# Patient Record
Sex: Female | Born: 1950
Health system: Southern US, Community
[De-identification: ages and names within clinical notes are randomized; demographics above are authoritative.]

## PROBLEM LIST (undated history)

## (undated) DIAGNOSIS — N816 Rectocele: Secondary | ICD-10-CM

## (undated) DIAGNOSIS — E669 Obesity, unspecified: Secondary | ICD-10-CM

## (undated) DIAGNOSIS — F32A Depression, unspecified: Secondary | ICD-10-CM

## (undated) DIAGNOSIS — M722 Plantar fascial fibromatosis: Secondary | ICD-10-CM

## (undated) DIAGNOSIS — R011 Cardiac murmur, unspecified: Secondary | ICD-10-CM

## (undated) DIAGNOSIS — J449 Chronic obstructive pulmonary disease, unspecified: Secondary | ICD-10-CM

## (undated) DIAGNOSIS — H9190 Unspecified hearing loss, unspecified ear: Secondary | ICD-10-CM

## (undated) DIAGNOSIS — K219 Gastro-esophageal reflux disease without esophagitis: Secondary | ICD-10-CM

## (undated) DIAGNOSIS — F419 Anxiety disorder, unspecified: Secondary | ICD-10-CM

## (undated) DIAGNOSIS — T7840XA Allergy, unspecified, initial encounter: Secondary | ICD-10-CM

## (undated) DIAGNOSIS — J101 Influenza due to other identified influenza virus with other respiratory manifestations: Secondary | ICD-10-CM

## (undated) DIAGNOSIS — M8580 Other specified disorders of bone density and structure, unspecified site: Secondary | ICD-10-CM

## (undated) DIAGNOSIS — I639 Cerebral infarction, unspecified: Secondary | ICD-10-CM

## (undated) DIAGNOSIS — O00109 Unspecified tubal pregnancy without intrauterine pregnancy: Secondary | ICD-10-CM

## (undated) DIAGNOSIS — E039 Hypothyroidism, unspecified: Secondary | ICD-10-CM

## (undated) DIAGNOSIS — D696 Thrombocytopenia, unspecified: Secondary | ICD-10-CM

## (undated) DIAGNOSIS — K589 Irritable bowel syndrome without diarrhea: Secondary | ICD-10-CM

## (undated) DIAGNOSIS — M51369 Other intervertebral disc degeneration, lumbar region without mention of lumbar back pain or lower extremity pain: Secondary | ICD-10-CM

## (undated) DIAGNOSIS — I10 Essential (primary) hypertension: Secondary | ICD-10-CM

## (undated) DIAGNOSIS — E785 Hyperlipidemia, unspecified: Secondary | ICD-10-CM

## (undated) DIAGNOSIS — F329 Major depressive disorder, single episode, unspecified: Secondary | ICD-10-CM

## (undated) DIAGNOSIS — J189 Pneumonia, unspecified organism: Secondary | ICD-10-CM

## (undated) DIAGNOSIS — C44300 Unspecified malignant neoplasm of skin of unspecified part of face: Secondary | ICD-10-CM

## (undated) DIAGNOSIS — C44601 Unspecified malignant neoplasm of skin of unspecified upper limb, including shoulder: Secondary | ICD-10-CM

## (undated) DIAGNOSIS — M5136 Other intervertebral disc degeneration, lumbar region: Secondary | ICD-10-CM

## (undated) HISTORY — PX: UPPER GI ENDOSCOPY: SHX6162

## (undated) HISTORY — DX: Other specified disorders of bone density and structure, unspecified site: M85.80

## (undated) HISTORY — DX: Unspecified malignant neoplasm of skin of unspecified part of face: C44.300

## (undated) HISTORY — DX: Plantar fascial fibromatosis: M72.2

## (undated) HISTORY — DX: Anxiety disorder, unspecified: F41.9

## (undated) HISTORY — DX: Chronic obstructive pulmonary disease, unspecified: J44.9

## (undated) HISTORY — DX: Allergy, unspecified, initial encounter: T78.40XA

## (undated) HISTORY — DX: Essential (primary) hypertension: I10

## (undated) HISTORY — DX: Obesity, unspecified: E66.9

## (undated) HISTORY — PX: FOOT SURGERY: SHX648

## (undated) HISTORY — DX: Unspecified hearing loss, unspecified ear: H91.90

## (undated) HISTORY — PX: COLONOSCOPY: SHX174

## (undated) HISTORY — DX: Major depressive disorder, single episode, unspecified: F32.9

## (undated) HISTORY — DX: Rectocele: N81.6

## (undated) HISTORY — DX: Gastro-esophageal reflux disease without esophagitis: K21.9

## (undated) HISTORY — DX: Thrombocytopenia, unspecified: D69.6

## (undated) HISTORY — DX: Cardiac murmur, unspecified: R01.1

## (undated) HISTORY — DX: Hypothyroidism, unspecified: E03.9

## (undated) HISTORY — DX: Other intervertebral disc degeneration, lumbar region: M51.36

## (undated) HISTORY — PX: HERNIA REPAIR: SHX51

## (undated) HISTORY — PX: APPENDECTOMY: SHX54

## (undated) HISTORY — DX: Depression, unspecified: F32.A

## (undated) HISTORY — DX: Other intervertebral disc degeneration, lumbar region without mention of lumbar back pain or lower extremity pain: M51.369

## (undated) HISTORY — PX: CYST EXCISION: SHX5701

## (undated) HISTORY — DX: Hyperlipidemia, unspecified: E78.5

## (undated) HISTORY — DX: Unspecified malignant neoplasm of skin of unspecified upper limb, including shoulder: C44.601

## (undated) HISTORY — DX: Cerebral infarction, unspecified: I63.9

---

## 1973-05-31 HISTORY — PX: TUBAL LIGATION: SHX77

## 1998-06-13 ENCOUNTER — Other Ambulatory Visit: Admission: RE | Admit: 1998-06-13 | Discharge: 1998-06-13 | Payer: Self-pay | Admitting: Otolaryngology

## 1998-11-18 ENCOUNTER — Other Ambulatory Visit: Admission: RE | Admit: 1998-11-18 | Discharge: 1998-11-18 | Payer: Self-pay | Admitting: Family Medicine

## 2000-02-09 LAB — FECAL OCCULT BLOOD, GUAIAC: Fecal Occult Blood: NEGATIVE

## 2000-02-15 ENCOUNTER — Other Ambulatory Visit: Admission: RE | Admit: 2000-02-15 | Discharge: 2000-02-15 | Payer: Self-pay | Admitting: Family Medicine

## 2000-05-25 ENCOUNTER — Encounter: Payer: Self-pay | Admitting: Orthopedic Surgery

## 2000-05-25 ENCOUNTER — Ambulatory Visit (HOSPITAL_COMMUNITY): Admission: RE | Admit: 2000-05-25 | Discharge: 2000-05-25 | Payer: Self-pay | Admitting: Orthopedic Surgery

## 2001-06-29 ENCOUNTER — Emergency Department (HOSPITAL_COMMUNITY): Admission: AC | Admit: 2001-06-29 | Discharge: 2001-06-29 | Payer: Self-pay

## 2001-12-11 ENCOUNTER — Encounter: Payer: Self-pay | Admitting: Family Medicine

## 2001-12-11 ENCOUNTER — Encounter: Admission: RE | Admit: 2001-12-11 | Discharge: 2001-12-11 | Payer: Self-pay | Admitting: Family Medicine

## 2001-12-14 ENCOUNTER — Encounter: Admission: RE | Admit: 2001-12-14 | Discharge: 2002-01-18 | Payer: Self-pay | Admitting: Family Medicine

## 2002-05-31 HISTORY — PX: CHOLECYSTECTOMY: SHX55

## 2002-11-29 HISTORY — PX: OTHER SURGICAL HISTORY: SHX169

## 2002-12-17 ENCOUNTER — Emergency Department (HOSPITAL_COMMUNITY): Admission: EM | Admit: 2002-12-17 | Discharge: 2002-12-17 | Payer: Self-pay | Admitting: Emergency Medicine

## 2002-12-17 ENCOUNTER — Encounter: Payer: Self-pay | Admitting: Emergency Medicine

## 2003-01-02 ENCOUNTER — Other Ambulatory Visit: Admission: RE | Admit: 2003-01-02 | Discharge: 2003-01-02 | Payer: Self-pay | Admitting: Family Medicine

## 2003-01-02 ENCOUNTER — Encounter (INDEPENDENT_AMBULATORY_CARE_PROVIDER_SITE_OTHER): Payer: Self-pay | Admitting: Internal Medicine

## 2003-01-02 LAB — CONVERTED CEMR LAB: Pap Smear: NORMAL

## 2003-09-06 ENCOUNTER — Inpatient Hospital Stay (HOSPITAL_COMMUNITY): Admission: EM | Admit: 2003-09-06 | Discharge: 2003-09-09 | Payer: Self-pay | Admitting: Podiatry

## 2003-09-08 ENCOUNTER — Encounter (INDEPENDENT_AMBULATORY_CARE_PROVIDER_SITE_OTHER): Payer: Self-pay | Admitting: *Deleted

## 2004-04-10 ENCOUNTER — Ambulatory Visit: Payer: Self-pay | Admitting: Family Medicine

## 2004-04-17 ENCOUNTER — Ambulatory Visit: Payer: Self-pay | Admitting: Internal Medicine

## 2004-05-12 ENCOUNTER — Ambulatory Visit: Payer: Self-pay | Admitting: Family Medicine

## 2004-05-27 ENCOUNTER — Ambulatory Visit: Payer: Self-pay | Admitting: Internal Medicine

## 2004-09-19 ENCOUNTER — Emergency Department (HOSPITAL_COMMUNITY): Admission: EM | Admit: 2004-09-19 | Discharge: 2004-09-19 | Payer: Self-pay | Admitting: Emergency Medicine

## 2004-10-23 ENCOUNTER — Ambulatory Visit: Payer: Self-pay | Admitting: Internal Medicine

## 2004-12-08 ENCOUNTER — Ambulatory Visit: Payer: Self-pay | Admitting: Family Medicine

## 2005-03-31 ENCOUNTER — Ambulatory Visit: Payer: Self-pay | Admitting: Family Medicine

## 2005-04-16 ENCOUNTER — Ambulatory Visit: Payer: Self-pay | Admitting: Family Medicine

## 2005-05-14 ENCOUNTER — Ambulatory Visit: Payer: Self-pay | Admitting: Family Medicine

## 2005-06-14 ENCOUNTER — Ambulatory Visit: Payer: Self-pay | Admitting: Family Medicine

## 2005-08-25 ENCOUNTER — Ambulatory Visit: Payer: Self-pay | Admitting: Family Medicine

## 2005-09-01 ENCOUNTER — Ambulatory Visit: Payer: Self-pay | Admitting: Family Medicine

## 2005-09-17 ENCOUNTER — Ambulatory Visit: Payer: Self-pay | Admitting: Family Medicine

## 2005-09-29 ENCOUNTER — Ambulatory Visit: Payer: Self-pay | Admitting: Family Medicine

## 2006-04-28 ENCOUNTER — Ambulatory Visit: Payer: Self-pay | Admitting: Family Medicine

## 2006-05-06 ENCOUNTER — Ambulatory Visit: Payer: Self-pay | Admitting: Family Medicine

## 2006-05-10 ENCOUNTER — Ambulatory Visit: Payer: Self-pay | Admitting: Family Medicine

## 2006-08-09 ENCOUNTER — Ambulatory Visit: Payer: Self-pay | Admitting: Family Medicine

## 2006-08-09 LAB — CONVERTED CEMR LAB
Cholesterol: 201 mg/dL (ref 0–200)
Direct LDL: 148.4 mg/dL
HDL: 34.7 mg/dL — ABNORMAL LOW (ref >39.0)
Total CHOL/HDL Ratio: 5.8
Triglycerides: 85 mg/dL (ref 0–149)
VLDL: 17 mg/dL (ref 0–40)

## 2007-02-27 ENCOUNTER — Encounter (INDEPENDENT_AMBULATORY_CARE_PROVIDER_SITE_OTHER): Payer: Self-pay | Admitting: Internal Medicine

## 2007-02-27 DIAGNOSIS — K219 Gastro-esophageal reflux disease without esophagitis: Secondary | ICD-10-CM | POA: Insufficient documentation

## 2007-02-27 DIAGNOSIS — F17200 Nicotine dependence, unspecified, uncomplicated: Secondary | ICD-10-CM | POA: Insufficient documentation

## 2007-02-27 DIAGNOSIS — F321 Major depressive disorder, single episode, moderate: Secondary | ICD-10-CM | POA: Insufficient documentation

## 2007-02-27 DIAGNOSIS — E7800 Pure hypercholesterolemia, unspecified: Secondary | ICD-10-CM | POA: Insufficient documentation

## 2007-02-27 DIAGNOSIS — F411 Generalized anxiety disorder: Secondary | ICD-10-CM | POA: Insufficient documentation

## 2007-02-27 DIAGNOSIS — E039 Hypothyroidism, unspecified: Secondary | ICD-10-CM | POA: Insufficient documentation

## 2007-03-06 ENCOUNTER — Encounter (INDEPENDENT_AMBULATORY_CARE_PROVIDER_SITE_OTHER): Payer: Self-pay | Admitting: Internal Medicine

## 2007-03-06 ENCOUNTER — Ambulatory Visit: Payer: Self-pay | Admitting: Family Medicine

## 2007-03-14 ENCOUNTER — Encounter (INDEPENDENT_AMBULATORY_CARE_PROVIDER_SITE_OTHER): Payer: Self-pay | Admitting: Internal Medicine

## 2007-03-16 ENCOUNTER — Encounter (INDEPENDENT_AMBULATORY_CARE_PROVIDER_SITE_OTHER): Payer: Self-pay | Admitting: Internal Medicine

## 2007-03-16 LAB — CONVERTED CEMR LAB
ALT: 26 units/L (ref 0–35)
AST: 22 units/L (ref 0–37)
Albumin: 3.9 g/dL (ref 3.5–5.2)
Alkaline Phosphatase: 105 units/L (ref 39–117)
BUN: 6 mg/dL (ref 6–23)
Basophils Absolute: 0.1 10*3/uL (ref 0.0–0.1)
Basophils Relative: 0.9 % (ref 0.0–1.0)
Bilirubin, Direct: 0.1 mg/dL (ref 0.0–0.3)
CO2: 28 meq/L (ref 19–32)
Calcium: 9.5 mg/dL (ref 8.4–10.5)
Chloride: 103 meq/L (ref 96–112)
Cholesterol: 168 mg/dL (ref 0–200)
Creatinine, Ser: 0.7 mg/dL (ref 0.4–1.2)
Eosinophils Absolute: 0.2 10*3/uL (ref 0.0–0.6)
Eosinophils Relative: 2.1 % (ref 0.0–5.0)
GFR calc Af Amer: 111 mL/min
GFR calc non Af Amer: 91 mL/min
Glucose, Bld: 84 mg/dL (ref 70–99)
HCT: 41.6 % (ref 36.0–46.0)
HDL: 33.2 mg/dL — ABNORMAL LOW (ref >39.0)
Hemoglobin: 14.8 g/dL (ref 12.0–15.0)
LDL Cholesterol: 114 mg/dL — ABNORMAL HIGH (ref 0–99)
Lymphocytes Relative: 33.7 % (ref 12.0–46.0)
MCHC: 35.5 g/dL (ref 30.0–36.0)
MCV: 85.6 fL (ref 78.0–100.0)
Monocytes Absolute: 0.6 10*3/uL (ref 0.2–0.7)
Monocytes Relative: 7.8 % (ref 3.0–11.0)
Neutro Abs: 4.4 10*3/uL (ref 1.4–7.7)
Neutrophils Relative %: 55.5 % (ref 43.0–77.0)
Platelets: 397 10*3/uL (ref 150–400)
Potassium: 4.2 meq/L (ref 3.5–5.1)
RBC: 4.86 M/uL (ref 3.87–5.11)
RDW: 13.5 % (ref 11.5–14.6)
Sodium: 138 meq/L (ref 135–145)
TSH: 0.73 microintl units/mL (ref 0.35–5.50)
Total Bilirubin: 0.9 mg/dL (ref 0.3–1.2)
Total CHOL/HDL Ratio: 5.1
Total Protein: 7.2 g/dL (ref 6.0–8.3)
Triglycerides: 103 mg/dL (ref 0–149)
VLDL: 21 mg/dL (ref 0–40)
WBC: 8 10*3/uL (ref 4.5–10.5)

## 2007-03-22 ENCOUNTER — Encounter (INDEPENDENT_AMBULATORY_CARE_PROVIDER_SITE_OTHER): Payer: Self-pay | Admitting: *Deleted

## 2007-04-04 ENCOUNTER — Encounter (INDEPENDENT_AMBULATORY_CARE_PROVIDER_SITE_OTHER): Payer: Self-pay | Admitting: *Deleted

## 2007-06-07 ENCOUNTER — Ambulatory Visit: Payer: Self-pay | Admitting: Family Medicine

## 2007-06-30 ENCOUNTER — Ambulatory Visit: Payer: Self-pay | Admitting: Family Medicine

## 2007-07-04 ENCOUNTER — Telehealth (INDEPENDENT_AMBULATORY_CARE_PROVIDER_SITE_OTHER): Payer: Self-pay | Admitting: Internal Medicine

## 2007-07-24 ENCOUNTER — Telehealth (INDEPENDENT_AMBULATORY_CARE_PROVIDER_SITE_OTHER): Payer: Self-pay | Admitting: Internal Medicine

## 2007-07-27 ENCOUNTER — Encounter (INDEPENDENT_AMBULATORY_CARE_PROVIDER_SITE_OTHER): Payer: Self-pay | Admitting: Internal Medicine

## 2007-07-27 ENCOUNTER — Telehealth (INDEPENDENT_AMBULATORY_CARE_PROVIDER_SITE_OTHER): Payer: Self-pay | Admitting: Internal Medicine

## 2007-08-14 ENCOUNTER — Ambulatory Visit: Payer: Self-pay | Admitting: Family Medicine

## 2007-08-14 DIAGNOSIS — J439 Emphysema, unspecified: Secondary | ICD-10-CM | POA: Insufficient documentation

## 2007-08-14 DIAGNOSIS — J309 Allergic rhinitis, unspecified: Secondary | ICD-10-CM | POA: Insufficient documentation

## 2007-08-14 DIAGNOSIS — J449 Chronic obstructive pulmonary disease, unspecified: Secondary | ICD-10-CM | POA: Insufficient documentation

## 2007-08-17 ENCOUNTER — Encounter (INDEPENDENT_AMBULATORY_CARE_PROVIDER_SITE_OTHER): Payer: Self-pay | Admitting: Internal Medicine

## 2007-10-03 ENCOUNTER — Emergency Department (HOSPITAL_COMMUNITY): Admission: EM | Admit: 2007-10-03 | Discharge: 2007-10-03 | Payer: Self-pay | Admitting: Emergency Medicine

## 2007-10-11 ENCOUNTER — Ambulatory Visit: Payer: Self-pay | Admitting: Family Medicine

## 2007-12-12 ENCOUNTER — Telehealth (INDEPENDENT_AMBULATORY_CARE_PROVIDER_SITE_OTHER): Payer: Self-pay | Admitting: Internal Medicine

## 2008-01-11 ENCOUNTER — Telehealth (INDEPENDENT_AMBULATORY_CARE_PROVIDER_SITE_OTHER): Payer: Self-pay | Admitting: Internal Medicine

## 2008-01-16 ENCOUNTER — Ambulatory Visit: Payer: Self-pay | Admitting: Family Medicine

## 2008-01-19 LAB — CONVERTED CEMR LAB: TSH: 1.65 microintl units/mL (ref 0.35–5.50)

## 2008-03-05 ENCOUNTER — Encounter (INDEPENDENT_AMBULATORY_CARE_PROVIDER_SITE_OTHER): Payer: Self-pay | Admitting: Internal Medicine

## 2008-03-11 ENCOUNTER — Ambulatory Visit: Payer: Self-pay | Admitting: Family Medicine

## 2008-03-20 ENCOUNTER — Ambulatory Visit: Payer: Self-pay | Admitting: Family Medicine

## 2008-04-03 ENCOUNTER — Encounter (INDEPENDENT_AMBULATORY_CARE_PROVIDER_SITE_OTHER): Payer: Self-pay | Admitting: Internal Medicine

## 2008-04-11 ENCOUNTER — Encounter: Payer: Self-pay | Admitting: Family Medicine

## 2008-04-11 ENCOUNTER — Encounter (INDEPENDENT_AMBULATORY_CARE_PROVIDER_SITE_OTHER): Payer: Self-pay | Admitting: *Deleted

## 2008-05-07 ENCOUNTER — Encounter (INDEPENDENT_AMBULATORY_CARE_PROVIDER_SITE_OTHER): Payer: Self-pay | Admitting: Internal Medicine

## 2008-06-06 ENCOUNTER — Encounter: Payer: Self-pay | Admitting: Family Medicine

## 2008-06-14 ENCOUNTER — Telehealth (INDEPENDENT_AMBULATORY_CARE_PROVIDER_SITE_OTHER): Payer: Self-pay | Admitting: Internal Medicine

## 2008-07-16 ENCOUNTER — Ambulatory Visit: Payer: Self-pay | Admitting: Family Medicine

## 2008-07-17 ENCOUNTER — Telehealth (INDEPENDENT_AMBULATORY_CARE_PROVIDER_SITE_OTHER): Payer: Self-pay | Admitting: Internal Medicine

## 2008-07-17 LAB — CONVERTED CEMR LAB: TSH: 2.01 microintl units/mL (ref 0.35–5.50)

## 2008-07-19 ENCOUNTER — Telehealth (INDEPENDENT_AMBULATORY_CARE_PROVIDER_SITE_OTHER): Payer: Self-pay | Admitting: Internal Medicine

## 2008-08-20 ENCOUNTER — Telehealth (INDEPENDENT_AMBULATORY_CARE_PROVIDER_SITE_OTHER): Payer: Self-pay | Admitting: Internal Medicine

## 2008-08-21 ENCOUNTER — Telehealth (INDEPENDENT_AMBULATORY_CARE_PROVIDER_SITE_OTHER): Payer: Self-pay | Admitting: Internal Medicine

## 2008-10-01 ENCOUNTER — Ambulatory Visit: Payer: Self-pay | Admitting: Family Medicine

## 2009-01-13 ENCOUNTER — Ambulatory Visit: Payer: Self-pay | Admitting: Family Medicine

## 2009-01-14 ENCOUNTER — Encounter: Payer: Self-pay | Admitting: Family Medicine

## 2009-01-16 ENCOUNTER — Ambulatory Visit: Payer: Self-pay | Admitting: Family Medicine

## 2009-01-16 LAB — CONVERTED CEMR LAB: TSH: 1.24 microintl units/mL (ref 0.35–5.50)

## 2009-01-23 ENCOUNTER — Encounter (INDEPENDENT_AMBULATORY_CARE_PROVIDER_SITE_OTHER): Payer: Self-pay | Admitting: Internal Medicine

## 2009-01-23 ENCOUNTER — Ambulatory Visit: Payer: Self-pay | Admitting: Family Medicine

## 2009-02-04 ENCOUNTER — Ambulatory Visit: Payer: Self-pay | Admitting: Family Medicine

## 2009-02-24 ENCOUNTER — Telehealth: Payer: Self-pay | Admitting: Family Medicine

## 2009-02-24 ENCOUNTER — Ambulatory Visit: Payer: Self-pay | Admitting: Family Medicine

## 2009-03-05 ENCOUNTER — Encounter (INDEPENDENT_AMBULATORY_CARE_PROVIDER_SITE_OTHER): Payer: Self-pay | Admitting: Internal Medicine

## 2009-03-06 ENCOUNTER — Other Ambulatory Visit: Admission: RE | Admit: 2009-03-06 | Discharge: 2009-03-06 | Payer: Self-pay | Admitting: Family Medicine

## 2009-03-06 ENCOUNTER — Encounter (INDEPENDENT_AMBULATORY_CARE_PROVIDER_SITE_OTHER): Payer: Self-pay | Admitting: Internal Medicine

## 2009-03-06 ENCOUNTER — Encounter: Payer: Self-pay | Admitting: Family Medicine

## 2009-03-06 ENCOUNTER — Ambulatory Visit: Payer: Self-pay | Admitting: Family Medicine

## 2009-03-06 DIAGNOSIS — Z780 Asymptomatic menopausal state: Secondary | ICD-10-CM | POA: Insufficient documentation

## 2009-03-06 DIAGNOSIS — I499 Cardiac arrhythmia, unspecified: Secondary | ICD-10-CM | POA: Insufficient documentation

## 2009-03-06 LAB — CONVERTED CEMR LAB: Pap Smear: NORMAL

## 2009-03-11 ENCOUNTER — Encounter: Payer: Self-pay | Admitting: Family Medicine

## 2009-03-12 LAB — CONVERTED CEMR LAB
ALT: 22 units/L (ref 0–35)
AST: 18 units/L (ref 0–37)
Albumin: 4.1 g/dL (ref 3.5–5.2)
Alkaline Phosphatase: 111 units/L (ref 39–117)
BUN: 9 mg/dL (ref 6–23)
Basophils Absolute: 0.1 10*3/uL (ref 0.0–0.1)
Basophils Relative: 0.9 % (ref 0.0–3.0)
Bilirubin, Direct: 0 mg/dL (ref 0.0–0.3)
CO2: 30 meq/L (ref 19–32)
Calcium: 9.3 mg/dL (ref 8.4–10.5)
Chloride: 100 meq/L (ref 96–112)
Cholesterol: 200 mg/dL (ref 0–200)
Creatinine, Ser: 0.8 mg/dL (ref 0.4–1.2)
Eosinophils Absolute: 0.2 10*3/uL (ref 0.0–0.7)
Eosinophils Relative: 2.8 % (ref 0.0–5.0)
GFR calc non Af Amer: 77.66 mL/min (ref >60)
Glucose, Bld: 82 mg/dL (ref 70–99)
HCT: 44.3 % (ref 36.0–46.0)
HDL: 29.2 mg/dL — ABNORMAL LOW (ref >39.00)
Hemoglobin: 14.9 g/dL (ref 12.0–15.0)
LDL Cholesterol: 143 mg/dL — ABNORMAL HIGH (ref 0–99)
Lymphocytes Relative: 35.2 % (ref 12.0–46.0)
Lymphs Abs: 2.4 10*3/uL (ref 0.7–4.0)
MCHC: 33.8 g/dL (ref 30.0–36.0)
MCV: 87.8 fL (ref 78.0–100.0)
Monocytes Absolute: 0.6 10*3/uL (ref 0.1–1.0)
Monocytes Relative: 8.6 % (ref 3.0–12.0)
Neutro Abs: 3.4 10*3/uL (ref 1.4–7.7)
Neutrophils Relative %: 52.5 % (ref 43.0–77.0)
Platelets: 377 10*3/uL (ref 150.0–400.0)
Potassium: 4.3 meq/L (ref 3.5–5.1)
RBC: 5.04 M/uL (ref 3.87–5.11)
RDW: 14 % (ref 11.5–14.6)
Sodium: 131 meq/L — ABNORMAL LOW (ref 135–145)
Total Bilirubin: 0.7 mg/dL (ref 0.3–1.2)
Total CHOL/HDL Ratio: 7
Total Protein: 7.9 g/dL (ref 6.0–8.3)
Triglycerides: 137 mg/dL (ref 0.0–149.0)
VLDL: 27.4 mg/dL (ref 0.0–40.0)
Vit D, 25-Hydroxy: 32 ng/mL (ref 30–89)
WBC: 6.7 10*3/uL (ref 4.5–10.5)

## 2009-03-19 ENCOUNTER — Encounter: Payer: Self-pay | Admitting: Cardiology

## 2009-03-20 ENCOUNTER — Ambulatory Visit: Payer: Self-pay | Admitting: Internal Medicine

## 2009-03-20 DIAGNOSIS — R072 Precordial pain: Secondary | ICD-10-CM | POA: Insufficient documentation

## 2009-03-20 DIAGNOSIS — R002 Palpitations: Secondary | ICD-10-CM | POA: Insufficient documentation

## 2009-03-20 DIAGNOSIS — R011 Cardiac murmur, unspecified: Secondary | ICD-10-CM | POA: Insufficient documentation

## 2009-03-25 ENCOUNTER — Encounter: Payer: Self-pay | Admitting: Internal Medicine

## 2009-03-25 ENCOUNTER — Ambulatory Visit: Payer: Self-pay

## 2009-03-25 ENCOUNTER — Telehealth (INDEPENDENT_AMBULATORY_CARE_PROVIDER_SITE_OTHER): Payer: Self-pay

## 2009-03-26 ENCOUNTER — Ambulatory Visit: Payer: Self-pay | Admitting: Cardiology

## 2009-03-26 ENCOUNTER — Encounter (HOSPITAL_COMMUNITY): Admission: RE | Admit: 2009-03-26 | Discharge: 2009-05-29 | Payer: Self-pay | Admitting: Internal Medicine

## 2009-03-26 ENCOUNTER — Ambulatory Visit: Payer: Self-pay

## 2009-03-27 ENCOUNTER — Telehealth (INDEPENDENT_AMBULATORY_CARE_PROVIDER_SITE_OTHER): Payer: Self-pay | Admitting: Internal Medicine

## 2009-03-27 ENCOUNTER — Encounter (INDEPENDENT_AMBULATORY_CARE_PROVIDER_SITE_OTHER): Payer: Self-pay | Admitting: Internal Medicine

## 2009-03-27 ENCOUNTER — Ambulatory Visit: Payer: Self-pay | Admitting: Family Medicine

## 2009-03-27 DIAGNOSIS — R413 Other amnesia: Secondary | ICD-10-CM | POA: Insufficient documentation

## 2009-03-28 ENCOUNTER — Telehealth (INDEPENDENT_AMBULATORY_CARE_PROVIDER_SITE_OTHER): Payer: Self-pay | Admitting: Internal Medicine

## 2009-03-28 ENCOUNTER — Encounter (INDEPENDENT_AMBULATORY_CARE_PROVIDER_SITE_OTHER): Payer: Self-pay | Admitting: Internal Medicine

## 2009-03-31 HISTORY — PX: FOOT SURGERY: SHX648

## 2009-03-31 LAB — CONVERTED CEMR LAB
Folate: 7.7 ng/mL
Sed Rate: 20 mm/hr (ref 0–22)
Vitamin B-12: >1500 pg/mL — ABNORMAL HIGH (ref 211–911)

## 2009-04-01 LAB — CONVERTED CEMR LAB

## 2009-04-17 ENCOUNTER — Encounter (INDEPENDENT_AMBULATORY_CARE_PROVIDER_SITE_OTHER): Payer: Self-pay | Admitting: Internal Medicine

## 2009-04-23 ENCOUNTER — Encounter (INDEPENDENT_AMBULATORY_CARE_PROVIDER_SITE_OTHER): Payer: Self-pay | Admitting: *Deleted

## 2009-04-30 ENCOUNTER — Ambulatory Visit: Payer: Self-pay | Admitting: Family Medicine

## 2009-06-03 ENCOUNTER — Telehealth (INDEPENDENT_AMBULATORY_CARE_PROVIDER_SITE_OTHER): Payer: Self-pay | Admitting: Internal Medicine

## 2009-07-28 ENCOUNTER — Ambulatory Visit: Payer: Self-pay | Admitting: Family Medicine

## 2009-07-28 ENCOUNTER — Telehealth: Payer: Self-pay | Admitting: Family Medicine

## 2009-08-04 ENCOUNTER — Telehealth: Payer: Self-pay | Admitting: Family Medicine

## 2009-09-15 ENCOUNTER — Encounter: Payer: Self-pay | Admitting: Family Medicine

## 2009-10-09 ENCOUNTER — Telehealth: Payer: Self-pay | Admitting: Family Medicine

## 2009-10-21 ENCOUNTER — Ambulatory Visit: Payer: Self-pay | Admitting: Family Medicine

## 2009-10-21 DIAGNOSIS — H6990 Unspecified Eustachian tube disorder, unspecified ear: Secondary | ICD-10-CM | POA: Insufficient documentation

## 2009-10-21 DIAGNOSIS — M461 Sacroiliitis, not elsewhere classified: Secondary | ICD-10-CM | POA: Insufficient documentation

## 2009-10-21 DIAGNOSIS — L919 Hypertrophic disorder of the skin, unspecified: Secondary | ICD-10-CM

## 2009-10-21 DIAGNOSIS — H6980 Other specified disorders of Eustachian tube, unspecified ear: Secondary | ICD-10-CM | POA: Insufficient documentation

## 2009-10-21 DIAGNOSIS — L909 Atrophic disorder of skin, unspecified: Secondary | ICD-10-CM | POA: Insufficient documentation

## 2009-10-21 DIAGNOSIS — L989 Disorder of the skin and subcutaneous tissue, unspecified: Secondary | ICD-10-CM | POA: Insufficient documentation

## 2010-02-24 ENCOUNTER — Ambulatory Visit: Payer: Self-pay | Admitting: Internal Medicine

## 2010-02-24 DIAGNOSIS — M545 Low back pain, unspecified: Secondary | ICD-10-CM | POA: Insufficient documentation

## 2010-02-24 DIAGNOSIS — G8929 Other chronic pain: Secondary | ICD-10-CM | POA: Insufficient documentation

## 2010-03-04 ENCOUNTER — Ambulatory Visit: Payer: Self-pay | Admitting: Family Medicine

## 2010-03-04 ENCOUNTER — Telehealth (INDEPENDENT_AMBULATORY_CARE_PROVIDER_SITE_OTHER): Payer: Self-pay | Admitting: *Deleted

## 2010-03-05 LAB — CONVERTED CEMR LAB
ALT: 20 units/L (ref 0–35)
AST: 15 units/L (ref 0–37)
Albumin: 3.7 g/dL (ref 3.5–5.2)
Alkaline Phosphatase: 100 units/L (ref 39–117)
BUN: 10 mg/dL (ref 6–23)
Basophils Absolute: 0.1 10*3/uL (ref 0.0–0.1)
Basophils Relative: 1.5 % (ref 0.0–3.0)
Bilirubin, Direct: 0.1 mg/dL (ref 0.0–0.3)
CO2: 28 meq/L (ref 19–32)
Calcium: 9 mg/dL (ref 8.4–10.5)
Chloride: 100 meq/L (ref 96–112)
Cholesterol: 206 mg/dL — ABNORMAL HIGH (ref 0–200)
Creatinine, Ser: 0.8 mg/dL (ref 0.4–1.2)
Direct LDL: 155.4 mg/dL
Eosinophils Absolute: 0.2 10*3/uL (ref 0.0–0.7)
Eosinophils Relative: 2.4 % (ref 0.0–5.0)
GFR calc non Af Amer: 76.3 mL/min (ref >60)
Glucose, Bld: 98 mg/dL (ref 70–99)
HCT: 40.4 % (ref 36.0–46.0)
HDL: 31.8 mg/dL — ABNORMAL LOW (ref >39.00)
Hemoglobin: 13.8 g/dL (ref 12.0–15.0)
Lymphocytes Relative: 42.7 % (ref 12.0–46.0)
Lymphs Abs: 2.8 10*3/uL (ref 0.7–4.0)
MCHC: 34 g/dL (ref 30.0–36.0)
MCV: 86.4 fL (ref 78.0–100.0)
Monocytes Absolute: 0.6 10*3/uL (ref 0.1–1.0)
Monocytes Relative: 8.8 % (ref 3.0–12.0)
Neutro Abs: 3 10*3/uL (ref 1.4–7.7)
Neutrophils Relative %: 44.6 % (ref 43.0–77.0)
Platelets: 361 10*3/uL (ref 150.0–400.0)
Potassium: 4.5 meq/L (ref 3.5–5.1)
RBC: 4.68 M/uL (ref 3.87–5.11)
RDW: 14.8 % — ABNORMAL HIGH (ref 11.5–14.6)
Sodium: 136 meq/L (ref 135–145)
TSH: 2.07 microintl units/mL (ref 0.35–5.50)
Total Bilirubin: 0.3 mg/dL (ref 0.3–1.2)
Total CHOL/HDL Ratio: 6
Total Protein: 6.7 g/dL (ref 6.0–8.3)
Triglycerides: 214 mg/dL — ABNORMAL HIGH (ref 0.0–149.0)
VLDL: 42.8 mg/dL — ABNORMAL HIGH (ref 0.0–40.0)
WBC: 6.7 10*3/uL (ref 4.5–10.5)

## 2010-03-19 ENCOUNTER — Encounter: Payer: Self-pay | Admitting: Internal Medicine

## 2010-03-19 ENCOUNTER — Ambulatory Visit: Payer: Self-pay | Admitting: Family Medicine

## 2010-03-19 DIAGNOSIS — M8580 Other specified disorders of bone density and structure, unspecified site: Secondary | ICD-10-CM | POA: Insufficient documentation

## 2010-03-19 DIAGNOSIS — K5909 Other constipation: Secondary | ICD-10-CM | POA: Insufficient documentation

## 2010-03-19 LAB — CONVERTED CEMR LAB
Cholesterol, target level: 200 mg/dL
HDL goal, serum: 40 mg/dL
LDL Goal: 100 mg/dL

## 2010-03-19 LAB — HM PAP SMEAR

## 2010-04-17 ENCOUNTER — Telehealth: Payer: Self-pay | Admitting: Family Medicine

## 2010-04-17 ENCOUNTER — Encounter (INDEPENDENT_AMBULATORY_CARE_PROVIDER_SITE_OTHER): Payer: Self-pay | Admitting: *Deleted

## 2010-04-20 ENCOUNTER — Encounter: Payer: Self-pay | Admitting: Family Medicine

## 2010-04-20 ENCOUNTER — Ambulatory Visit: Payer: Self-pay | Admitting: Internal Medicine

## 2010-04-22 LAB — HM MAMMOGRAPHY: HM Mammogram: NORMAL

## 2010-04-29 ENCOUNTER — Ambulatory Visit: Payer: Self-pay | Admitting: Family Medicine

## 2010-04-30 LAB — CONVERTED CEMR LAB: TSH: 1.77 microintl units/mL (ref 0.35–5.50)

## 2010-05-05 ENCOUNTER — Ambulatory Visit: Payer: Self-pay | Admitting: Internal Medicine

## 2010-05-05 LAB — HM COLONOSCOPY

## 2010-06-22 ENCOUNTER — Other Ambulatory Visit: Payer: Self-pay | Admitting: Family Medicine

## 2010-06-22 ENCOUNTER — Ambulatory Visit
Admission: RE | Admit: 2010-06-22 | Discharge: 2010-06-22 | Payer: Self-pay | Source: Home / Self Care | Attending: Family Medicine | Admitting: Family Medicine

## 2010-06-22 LAB — LIPID PANEL
Cholesterol: 235 mg/dL — ABNORMAL HIGH (ref 0–200)
HDL: 36.2 mg/dL — ABNORMAL LOW (ref >39.00)
Total CHOL/HDL Ratio: 6
Triglycerides: 200 mg/dL — ABNORMAL HIGH (ref 0.0–149.0)
VLDL: 40 mg/dL (ref 0.0–40.0)

## 2010-06-22 LAB — LDL CHOLESTEROL, DIRECT: Direct LDL: 187.9 mg/dL

## 2010-06-26 ENCOUNTER — Ambulatory Visit
Admission: RE | Admit: 2010-06-26 | Discharge: 2010-06-26 | Payer: Self-pay | Source: Home / Self Care | Attending: Family Medicine | Admitting: Family Medicine

## 2010-06-26 DIAGNOSIS — M6530 Trigger finger, unspecified finger: Secondary | ICD-10-CM | POA: Insufficient documentation

## 2010-06-30 NOTE — Progress Notes (Signed)
Summary: still with spots on buttocks  Phone Note Call from Patient   Caller: Patient Summary of Call: Pt has been treated for a staph infection and said she still has some tender spots on her buttocks.  Still runs some low grade fever.  She is asking if she needs to come back in or do you want to call her in something?  [facility]. Initial call taken by: Lowella Petties CMA,  February 24, 2009 9:54 AM  Follow-up for Phone Call        Would feel better if I could assess again. Follow-up by: Shaune Leeks MD,  February 24, 2009 10:06 AM  Additional Follow-up for Phone Call Additional follow up Details #1::        Appt made.                   Lowella Petties CMA  February 24, 2009 10:28 AM

## 2010-06-30 NOTE — Assessment & Plan Note (Signed)
Summary: Cardiology Nuclear Study  Nuclear Med Background Indications for Stress Test: Evaluation for Ischemia, Surgical Clearance  Indications Comments: Pending foot surgery by:  History: COPD   Symptoms: Chest Tightness, Chest Tightness with Exertion, DOE, Fatigue, Palpitations, Rapid HR, SOB    Nuclear Pre-Procedure Cardiac Risk Factors: Hypertension, Lipids, Obesity, Smoker Caffeine/Decaff Intake: none NPO After: 8:30 PM Lungs: clear IV 0.9% NS with Angio Cath: 20g     IV Site: (R) wrist IV Started by: Stanton Kidney EMT-P Chest Size (in) 38     Cup Size C     Height (in): 60 Weight (lb): 175 BMI: 34.30  Nuclear Med Study 1 or 2 day study:  1 day     Stress Test Type:  Adenosine Reading MD:  Willa Rough, MD     Referring MD:  D.Bensimhon Resting Radionuclide:  Technetium 66m Tetrofosmin     Resting Radionuclide Dose:  11 mCi  Stress Radionuclide:  Technetium 14m Tetrofosmin     Stress Radionuclide Dose:  33 mCi   Stress Protocol  Dose of Adenosine:  44.5 mg    Stress Test Technologist:  Milana Na EMT-P     Nuclear Technologist:  Domenic Polite CNMT  Rest Procedure  Myocardial perfusion imaging was performed at rest 45 minutes following the intravenous administration of Myoview Technetium 60m Tetrofosmin.  Stress Procedure  The patient received IV adenosine at 140 mcg/kg/min for 4 minutes. 2nd degree avb with infusion. There were no significant changes, rare pvc with infusion. Myoview was injected at the 2 minute mark and quantitative spect images were obtained after a 45 minute delay.  QPS Raw Data Images:  Normal; no motion artifact; normal heart/lung ratio. Stress Images:  There is normal uptake in all areas. Rest Images:  Uniform and normal uptake of tracer in all myocardial segments. Subtraction (SDS):  No evidence of ischemia. Transient Ischemic Dilatation:  1.09  (Normal <1.22)  Lung/Heart Ratio:  .25  (Normal <0.45)  Quantitative Gated Spect Images  QGS EDV:  69 ml QGS ESV:  19 ml QGS EF:  72 % QGS cine images:  Normal wall motion  Findings Normal nuclear study      Overall Impression  Exercise Capacity: Adenosine study with no exercise. BP Response: Normal blood pressure response. Clinical Symptoms: chest pain ECG Impression: No significant ST segment change suggestive of ischemia. Overall Impression: Normal stress nuclear study.  Appended Document: Cardiology Nuclear Study ok to proceed with surgery without further cardiac work-up  Appended Document: Cardiology Nuclear Study pt aware. Charlena Cross RN BSN

## 2010-06-30 NOTE — Progress Notes (Signed)
Summary: Sore throat  Phone Note Call from Patient Call back at [phone]   Caller: Patient Call For: Dr. Ermalene Searing Summary of Call: Patient states that she has a sore throat times three days, coughing up phelm, no fever, no nausea, no vomiting, no diarrhea.  Has been on antibiotics since November because she had foot surgery.  She is concerned because these symptoms have turned into bronchitis in the past.  Has not gargled with warm salt water or anything else to try and help.  She says she wants to establish care with Dr. Ermalene Searing since Willaim Sheng is gone, advised that she will have to schedule that appt.   Please advise.  [facility] Initial call taken by: Linde Gillis CMA Duncan Dull),  July 28, 2009 8:41 AM  Follow-up for Phone Call        Agree needs to be seen today or tommorow..either by myself or other MD given COPD history.  If SOB severe go to ER.  Follow-up by: Kerby Nora MD,  July 28, 2009 9:07 AM  Additional Follow-up for Phone Call Additional follow up Details #1::        patient has appt today with dr copland Additional Follow-up by: Benny Lennert CMA (AAMA),  July 28, 2009 9:15 AM

## 2010-06-30 NOTE — Progress Notes (Signed)
Summary: Refills  Phone Note Refill Request Message from:  Patient on June 03, 2009 4:14 PM  Refills Requested: Medication #1:  NEXIUM 40 MG CPDR Take 1 capsule by mouth once a day  Medication #2:  WELLBUTRIN XL 150 MG  TB24 take 3 every am by mouth for depression  Medication #3:  SYNTHROID 100 MCG TABS 1 daily for thyroid pt needs 90day supply sent to [facility], she is switching from [facility] because of her insurance   Method Requested: Telephone to Pharmacy Initial call taken by: Mervin Hack CMA Duncan Dull),  June 03, 2009 4:15 PM  Follow-up for Phone Call        Rxs completed  Billie-Lynn Tyler Deis FNP  June 03, 2009 4:55 PM     Prescriptions: SYNTHROID 100 MCG TABS (LEVOTHYROXINE SODIUM) 1 daily for thyroid  #90 x 3   Entered and Authorized by:   Gildardo Griffes FNP   Signed by:   Gildardo Griffes FNP on 06/03/2009   Method used:   [facility]        [facility] [facility]       [address]       [place], [place]  [address]       Ph: [phone] or [phone]       Fax: [phone]   RxID:   [id number] WELLBUTRIN XL 150 MG  TB24 (BUPROPION HCL) take 3 every am by mouth for depression  #270 x 3   Entered and Authorized by:   Gildardo Griffes FNP   Signed by:   Gildardo Griffes FNP on 06/03/2009   Method used:   [facility]        [facility] [facility]       [address]       [place], [place]  [address]       Ph: [phone] or [phone]       Fax: [phone]   RxID:   [id number] NEXIUM 40 MG CPDR (ESOMEPRAZOLE MAGNESIUM) Take 1 capsule by mouth once a day  #90 x 4   Entered and Authorized by:   Gildardo Griffes FNP   Signed by:   Gildardo Griffes FNP on 06/03/2009   Method used:   [facility]        [facility] (retail)       [address]       [place], [place]  [address]       Ph: [phone] or [phone]  Fax: [phone]   RxID:   [id number]

## 2010-06-30 NOTE — Progress Notes (Signed)
Summary: wellbutrin is too expensive  Phone Note Call from Patient Call back at Home Phone [phone]   Caller: Patient Call For: Tiffany Griffes FNP Summary of Call: Pt came in shortly before 5:00 stating that her generic wellbutrin is too expensive and she needs something else called to the pharmacy-- she doesn't have any for the week end.  I told her that I would send you a note but that it could be monday before anything gets called in.  I did tell her to check with her insurance to see what meds they prefer.  [facility].  Initial call taken by: Lowella Petties,  June 14, 2008 5:05 PM  Follow-up for Phone Call        we can try wellbutrin SR to see if can afford--will do Rx for this--will need to take in morning and second dose before 3pm daily    Billie-Lynn Tyler Deis FNP  June 14, 2008 5:18 PM   Additional Follow-up for Phone Call Additional follow up Details #1::        PATIENT NOTIFIED TO PICK UP NEW PRESCRIBTION Additional Follow-up by: Providence Crosby,  June 14, 2008 5:24 PM    New/Updated Medications: WELLBUTRIN SR 100 MG XR12H-TAB (BUPROPION HCL) take 1 each morning and 1 before 3pm daily   Prescriptions: WELLBUTRIN SR 100 MG XR12H-TAB (BUPROPION HCL) take 1 each morning and 1 before 3pm daily  #60 x 6   Entered and Authorized by:   Tiffany Griffes FNP   Signed by:   Tiffany Griffes FNP on 06/14/2008   Method used:   [facility]        [facility] (retail)       [address]       [place], [place]  [address]       Ph: [phone] or [phone]       Fax: [phone]   RxID:   [id number]

## 2010-06-30 NOTE — Letter (Signed)
Summary: Results Follow up Letter  [facility]  [address] [place], [place] [address]   Phone: [phone]  Fax: [phone]    04/11/2008 MRN: [id number]  Tiffany Orozco [address] [place], [place]  [address]  Dear [name],  The following are the results of your recent test(s):  Test         Result    Pap Smear:        Normal _____  Not Normal _____ Comments: ______________________________________________________ Cholesterol: LDL(Bad cholesterol):         Your goal is less than:         HDL (Good cholesterol):       Your goal is more than: Comments:  ______________________________________________________ Mammogram:        Normal __X___  Not Normal _____ Comments:Your Mammogram was normal. Please make sure to repeat in 1 years. Enclosed is a copy of your report.  ___________________________________________________________________ Hemoccult:        Normal _____  Not normal _______ Comments:    _____________________________________________________________________ Other Tests:    We routinely do not discuss normal results over the telephone.  If you desire a copy of the results, or you have any questions about this information we can discuss them at your next office visit.   Sincerely,    Billie Bean,FNP

## 2010-06-30 NOTE — Progress Notes (Signed)
Summary: Not feeling any better  Phone Note Call from Patient Call back at Home Phone [phone]   Caller: Patient Call For: Dr. Patsy Lager Summary of Call: Saw Dr. Patsy Lager last Monday and was told if she was no better to call back.  Chest congestion, dry cough, coughs so much that her back hurts now, wheezing, and SOB at times worse at night.  Chills at times, no fever.  No vomiting, some nausea.   Given a Zpak which she is now finished with.  [facility]. Initial call taken by: Linde Gillis CMA Duncan Dull),  August 04, 2009 8:36 AM  Follow-up for Phone Call        remember patient, advised prednisone at time of care and declined  will call in additional abx and steroids Follow-up by: Hannah Beat MD,  August 04, 2009 8:47 AM  Additional Follow-up for Phone Call Additional follow up Details #1::        Patient notified as instructed Additional Follow-up by: Linde Gillis CMA Duncan Dull),  August 04, 2009 10:40 AM    New/Updated Medications: PREDNISONE 10 MG  TABS (PREDNISONE) 4 tabs by mouth x 4 days, then 3 tabs by mouth x 3 days, then 2 tabs by mouth x 2 days, then 1 tab by mouth x  2 days DOXYCYCLINE HYCLATE 100 MG CAPS (DOXYCYCLINE HYCLATE) Take 1 tab twice a day Prescriptions: DOXYCYCLINE HYCLATE 100 MG CAPS (DOXYCYCLINE HYCLATE) Take 1 tab twice a day  #20 x 0   Entered and Authorized by:   Hannah Beat MD   Signed by:   Hannah Beat MD on 08/04/2009   Method used:   [facility]        [facility] [facility])       [address]       [place], [place]  [address]       Ph: [phone] or [phone]       Fax: [phone]   RxID:   [id number] PREDNISONE 10 MG  TABS (PREDNISONE) 4 tabs by mouth x 4 days, then 3 tabs by mouth x 3 days, then 2 tabs by mouth x 2 days, then 1 tab by mouth x  2 days  #31 x 0   Entered and Authorized by:   Hannah Beat MD   Signed by:   Hannah Beat MD on 08/04/2009   Method used:   [facility]ronically [facility]   [facility] (retail)       [address]       [place], [place]  [address]       Ph: [phone] or [phone]       Fax: [phone]   RxID:   [id number]

## 2010-06-30 NOTE — Progress Notes (Signed)
Summary: wants referral to podiatrist  Phone Note Call from Patient Call back at Home Phone [phone]   Caller: Patient Call For: Tiffany Orozco Summary of Call: pt states she has had foot problems for years, has been seeing dr Al Corpus in [place] but is not happy there now, wants to see someone at [facility] in [place]---ph [phone] Initial call taken by: Lowella Petties,  January 11, 2008 1:54 PM  Follow-up for Phone Call        referral attatched  Gildardo Griffes FNP  January 11, 2008 2:07 PM   Additional Follow-up for Phone Call Additional follow up Details #1::        APPT MADE WITH DR Romualdo Bolk 8/18/0 AT 3:30. Carlton Adam  January 11, 2008 2:58 PM  Additional Follow-up by: Carlton Adam,  January 11, 2008 2:58 PM  New Problems: FOOT PAIN (ICD-729.5)   New Problems: FOOT PAIN (ICD-729.5)

## 2010-06-30 NOTE — Progress Notes (Signed)
Summary: Not any better  Phone Note Call from Patient Call back at [phone]   Caller: Patient Call For: Everrett Coombe, FNP Summary of Call: Pt was seen last week and was given an rx for Avelox to get filled if she did not get better.  Pt called to advise that she is not getting any better and will get rx filled tomorrow if she does not hear back from you.   Initial call taken by: Sydell Axon,  July 04, 2007 2:17 PM  Follow-up for Phone Call        go ahead and fill today and start--avelox  ..................................................................Marland KitchenBillie-Lynn Tyler Deis FNP  July 04, 2007 3:12 PM   Additional Follow-up for Phone Call Additional follow up Details #1::        pc to pt,  get rx  filled. Additional Follow-up by: Cooper Render,  July 04, 2007 3:28 PM

## 2010-06-30 NOTE — Progress Notes (Signed)
Summary: Bupropion   Phone Note Refill Request Message from:  [facility]  Refills Requested: Medication #1:  bupropian xl 150 mg # 90   Last Refilled: 06/26/2007 Initial call taken by: Cooper Render,  July 24, 2007 12:16 PM  Follow-up for Phone Call        [facility]  --[facility] to [facility].................................................................Marland KitchenBillie-Lynn Tyler Deis FNP  July 24, 2007 1:44 PM       Prescriptions: WELLBUTRIN XL 300 MG TB24 (BUPROPION HCL) Take 1 tablet by mouth once a day  #90 x 3   Entered and Authorized by:   Gildardo Griffes FNP   Signed by:   Gildardo Griffes FNP on 07/24/2007   Method used:   Electronically sent to ...       [facility]*       [address]       [place], [place]  [address]       Ph: [phone] or [phone]       Fax: [phone]   RxID:   [id number]

## 2010-06-30 NOTE — Miscellaneous (Signed)
Summary: [facility]  Clinical Lists Changes  Medications: Added new medication of MIRALAX   POWD (POLYETHYLENE GLYCOL 3350) As per prep  instructions. - Signed Added new medication of DULCOLAX 5 MG  TBEC (BISACODYL) Day before procedure take 2 at 3pm and 2 at 8pm. - Signed Added new medication of REGLAN 10 MG  TABS (METOCLOPRAMIDE HCL) As per prep instructions. - Signed Rx of MIRALAX   POWD (POLYETHYLENE GLYCOL 3350) As per prep  instructions.;  #255gm x 0;  Signed;  Entered by: Ezra Sites RN;  Authorized by: Hart Carwin MD;  Method used: [facility] [facility] [address] [place], [place]  [address], Ph: [phone] or [phone], Fax: [phone] Rx of DULCOLAX 5 MG  TBEC (BISACODYL) Day before procedure take 2 at 3pm and 2 at 8pm.;  #4 x 0;  Signed;  Entered by: Ezra Sites RN;  Authorized by: Hart Carwin MD;  Method used: [facility], [place], [place]  [address], Ph: [phone] or [phone], Fax: [phone] Rx of REGLAN 10 MG  TABS (METOCLOPRAMIDE HCL) As per prep instructions.;  #2 x 0;  Signed;  Entered by: Ezra Sites RN;  Authorized by: Hart Carwin MD;  Method used: [facility] [place], [place]  [address], Ph: [phone] or [phone], Fax: [phone] Allergies: Changed allergy or adverse reaction from * OXYCODONE to * OXYCODONE    Prescriptions: REGLAN 10 MG  TABS (METOCLOPRAMIDE HCL) As per prep instructions.  #2 x 0   Entered by:   Ezra Sites RN   Authorized by:   Hart Carwin MD   Signed by:   Ezra Sites RN on 04/20/2010   Method used:   [facility]        [facility] (retail)       [address]       [place], [place]  [address]       Ph: [phone] or [phone]       Fax: [phone]   RxID:   [id number] DULCOLAX 5 MG  TBEC (BISACODYL) Day before procedure take 2 at 3pm and 2 at 8pm.  #4 x 0  Entered by:   Ezra Sites RN   Authorized by:   Hart Carwin MD   Signed by:   Ezra Sites RN on 04/20/2010   Method used:   [facility]        [facility] (retail)       [address]       [place], [place]  [address]       Ph: [phone] or [phone]       Fax: [phone]   RxID:   [id number] MIRALAX   POWD (POLYETHYLENE GLYCOL 3350) As per prep  instructions.  #255gm x 0   Entered by:   Ezra Sites RN   Authorized by:   Hart Carwin MD   Signed by:   Ezra Sites RN on 04/20/2010   Method used:   [facility]        [facility] (retail)       [address]       [place], [place]  [address]       Ph: [phone] or [phone]       Fax: [phone]   RxID:   [id number]

## 2010-06-30 NOTE — Medication Information (Signed)
Summary: Approval for Bupropion/[employer]  Approval for Bupropion/[employer]   Imported By: Lanelle Bal 04/03/2009 12:15:49  _____________________________________________________________________  External Attachment:    Type:   Image     Comment:   External Document

## 2010-06-30 NOTE — Progress Notes (Signed)
Summary: REFILL FLONASE  Phone Note Outgoing Call   Call placed by: Providence Crosby,  July 19, 2008 8:47 AM Call placed to: Patient Summary of Call: INSURANCE WOULD NOT PAY FOR VERAMYST CALLED PATIENT AND SHE SAID FLONASE WOULD BE FINE Initial call taken by: Providence Crosby,  July 19, 2008 8:49 AM

## 2010-06-30 NOTE — Assessment & Plan Note (Signed)
Summary: 10 DAY FOLLOW UP/[facility]   Vital Signs:  Patient profile:   60 year old female Height:      60 inches Weight:      175 pounds Temp:     98.6 degrees F oral Pulse rate:   72 / minute Pulse rhythm:   regular BP sitting:   130 / 70  (left arm) Cuff size:   regular  Vitals Entered By: Providence Crosby LPN (January 23, 2009 2:44 PM) CC: 10 day followup   History of Present Illness: First lesion is not  cleared up, second is about healed up. No real new lesions. Do3esn't like the medicine but can tolerate it. No new problems.  Problems Prior to Update: 1)  Cellulitis and Abscess of Buttock  (ICD-682.5) 2)  Headache  (ICD-784.0) 3)  Wheezing  (ICD-786.07) 4)  Chronic Obstructive Pulmonary Disease, Acute Exacerbation  (ICD-491.21) 5)  Foot Pain  (ICD-729.5) 6)  Sciatica, Left  (ICD-724.3) 7)  Other&unspecified Diseases The Oral Soft Tissues  (ICD-528.9) 8)  COPD  (ICD-496) 9)  Allergic Rhinitis  (ICD-477.9) 10)  Depression  (ICD-311) 11)  Uri  (ICD-465.9) 12)  Serous Otitis  (ICD-381.4) 13)  Well Adult  (ICD-V70.0) 14)  Preoperative Examination  (ICD-V72.84) 15)  Hx of Tobacco Abuse  (ICD-305.1) 16)  Hx of Hypercholesterolemia  (ICD-272.0) 17)  Hypothyroidism  (ICD-244.9) 18)  Gerd  (ICD-530.81) 19)  Depression  (ICD-311) 20)  Anxiety  (ICD-300.00)  Medications Prior to Update: 1)  Nexium 40 Mg Cpdr (Esomeprazole Magnesium) .... Take 1 Capsule By Mouth Once A Day 2)  Zyrtec Allergy 10 Mg  Tabs (Cetirizine Hcl) .... Take 1 Tablet By Mouth Once A Day As Needed 3)  Fish Oil 1000 Mg  Caps (Omega-3 Fatty Acids) .... 3 Every Day 4)  Wellbutrin Xl 150 Mg  Tb24 (Bupropion Hcl) .... Take 3 Every Am By Mouth For Depression 5)  Albuterol 90 Mcg/act  Aers (Albuterol) .... As Needed 6)  Diclofenac Sodium 75 Mg  Tbec (Diclofenac Sodium) .... Take 1 Tablet By Mouth Every 12 Hours 7)  Flonase 50 Mcg/act Susp (Fluticasone Propionate) .... 2 Sprays To Each Nostril Daily 8)  Synthroid 100 Mcg  Tabs (Levothyroxine Sodium) .Marland Kitchen.. 1 Daily For Thyroid 9)  Bactrim Ds 800-160 Mg Tabs (Sulfamethoxazole-Trimethoprim) .... 2 Tabs By Mouth Two Times A Day  Allergies: 1)  * Niaspan 2)  * Oxycodone  Physical Exam  General:  alert, well-developed, well-nourished, well-hydrated, and overweight-appearing.  NAD Skin:  Top of gluteal cleft sinif for 1cm open ulcer on the right side and 2 amall red papules on the left at the level of the occlusive skin of the ulcer on the right.Marland KitchenMarland KitchenAll healing, largesty healed over and resolving, one other lesion healing. No fluctulance and minimal erythema.    Impression & Recommendations:  Problem # 1:  CELLULITIS AND ABSCESS OF BUTTOCK (ICD-682.5) Assessment Improved  Almost healed. Exrtend Abs. Her updated medication list for this problem includes:    Bactrim Ds 800-160 Mg Tabs (Sulfamethoxazole-trimethoprim) .Marland Kitchen... 2 tabs by mouth two times a day  Elevate affected area. Warm moist compresses for 20 minutes every 2 hours while awake. Take antibiotics as directed and take acetaminophen as needed. To be seen in 48-72 hours if no improvement, sooner if worse.  Problem # 2:  HYPOTHYROIDISM (ICD-244.9) Assessment: Unchanged Euthyroid on current replacemenrt dose. Cont. Her updated medication list for this problem includes:    Synthroid 100 Mcg Tabs (Levothyroxine sodium) .Marland Kitchen... 1 daily for thyroid  Labs Reviewed: TSH: 1.24 (01/16/2009)    Chol: 168 (03/06/2007)   HDL: 33.2 (03/06/2007)   LDL: 114 (03/06/2007)   TG: 103 (03/06/2007)  Complete Medication List: 1)  Nexium 40 Mg Cpdr (Esomeprazole magnesium) .... Take 1 capsule by mouth once a day 2)  Zyrtec Allergy 10 Mg Tabs (Cetirizine hcl) .... Take 1 tablet by mouth once a day as needed 3)  Fish Oil 1000 Mg Caps (Omega-3 fatty acids) .... 3 every day 4)  Wellbutrin Xl 150 Mg Tb24 (Bupropion hcl) .... Take 3 every am by mouth for depression 5)  Albuterol 90 Mcg/act Aers (Albuterol) .... As needed 6)   Diclofenac Sodium 75 Mg Tbec (Diclofenac sodium) .... Take 1 tablet by mouth every 12 hours 7)  Flonase 50 Mcg/act Susp (Fluticasone propionate) .... 2 sprays to each nostril daily 8)  Synthroid 100 Mcg Tabs (Levothyroxine sodium) .Marland Kitchen.. 1 daily for thyroid 9)  Bactrim Ds 800-160 Mg Tabs (Sulfamethoxazole-trimethoprim) .... 2 tabs by mouth two times a day  Patient Instructions: 1)  RTC 10 days. Prescriptions: BACTRIM DS 800-160 MG TABS (SULFAMETHOXAZOLE-TRIMETHOPRIM) 2 tabs by mouth two times a day  #40 x 0   Entered and Authorized by:   Shaune Leeks MD   Signed by:   Shaune Leeks MD on 01/23/2009   Method used:   Electronically to        [facility]* (retail)       [address]       [place], [place]  [address]       Ph: [phone]       Fax: [phone]   RxID:   [id number]

## 2010-06-30 NOTE — Procedures (Signed)
Summary: Colonoscopy  Patient: Tiffany Orozco Note: All result statuses are Final unless otherwise noted.  Tests: (1) Colonoscopy (COL)   COL Colonoscopy           DONE     [facility]     [address]     [place], [place]  [address]           COLONOSCOPY PROCEDURE REPORT           PATIENT:  [name], [name]  MR#:  [id number]     BIRTHDATE:  1950/12/29, 59 yrs. old  GENDER:  female     ENDOSCOPIST:  Hedwig Morton. Juanda Chance, MD     REF. BY:  Excell Seltzer, M.D.     PROCEDURE DATE:  05/05/2010     PROCEDURE:  Colonoscopy 78[address]     ASA CLASS:  Class I     INDICATIONS:  Routine Risk Screening last colon over 10 years ago,     no report available     MEDICATIONS:   Versed 7 mg, Fentanyl 50 mcg           DESCRIPTION OF PROCEDURE:   After the risks benefits and     alternatives of the procedure were thoroughly explained, informed     consent was obtained.  Digital rectal exam was performed and     revealed no rectal masses.   The LB 180AL [id number] endoscope was     introduced through the anus and advanced to the cecum, which was     identified by both the appendix and ileocecal valve, without     limitations.  The quality of the prep was good, using MiraLax.     The instrument was then slowly withdrawn as the colon was fully     examined.     <<PROCEDUREIMAGES>>           FINDINGS:  No polyps or cancers were seen (see image1, image2,     image3, and image4).   Retroflexed views in the rectum revealed no     abnormalities.    The scope was then withdrawn from the patient     and the procedure completed.           COMPLICATIONS:  None     ENDOSCOPIC IMPRESSION:     1) No polyps or cancers     2) Normal colonoscopy     RECOMMENDATIONS:     1) high fiber diet     REPEAT EXAM:  In 10 year(s) for.           ______________________________     Hedwig Morton. Juanda Chance, MD           CC:           n.     eSIGNED:   Hedwig Morton. Wilton Thrall at 05/05/2010 11:09 AM           Jani Files,  [id number]  Note: An exclamation mark (!) indicates a result that was not dispersed into the flowsheet. Document Creation Date: 05/05/2010 11:09 AM _______________________________________________________________________  (1) Order result status: Final Collection or observation date-time: 05/05/2010 11:04 Requested date-time:  Receipt date-time:  Reported date-time:  Referring Physician:   Ordering Physician: Lina Sar [id number]) Specimen Source:  Source: Launa Grill Order Number: [id number] Lab site:   Appended Document: Colonoscopy    Clinical Lists Changes  Observations: Added new observation of COLONNXTDUE: 04/2020 (05/05/2010 14:27)

## 2010-06-30 NOTE — Progress Notes (Signed)
----   Converted from flag ---- ---- 03/03/2010 5:07 PM, Kerby Nora MD wrote: CMET, lipids, CBC, TSH Dx v77.91, 244.9  ---- 03/03/2010 12:17 PM, Mills Koller wrote: Good Morning, This patient is scheduled for CPX with you, I need lab orders with dx, please. Thanks, Terri ------------------------------

## 2010-06-30 NOTE — Assessment & Plan Note (Signed)
Summary: CPX/JRR  r/s from 03/11/10   Vital Signs:  Patient profile:   60 year old female Height:      60 inches Weight:      183.0 pounds BMI:     35.87 Temp:     97.7 degrees F oral Pulse rate:   80 / minute Pulse rhythm:   regular BP sitting:   130 / 78  (left arm) Cuff size:   regular  Vitals Entered By: Benny Lennert CMA Duncan Dull) (March 19, 2010 8:19 AM)  History of Present Illness: Chief complaint cpx  The patient is here for annual wellness exam and preventative care.     She has the following chronic and acute medical issues.  High cholesterol see below  Hypothyroidism: TSH: 2.07 (03/04/2010 9:09:55 AM) stable on synthroid... but feeling fatigue, hair loss/thinning.  Felt much better on armour thyroid in past. She wishes to increase dose if possible.  COPD Smoking status:    Depression/anxiety: On wellbutrin.   Actue on chronic back pain...for flares using diclofenac and flexeril.  Xray: osteopenia, scoliosis, DDD,   Limited exercsie due to foot problems in past and back pain.  Lipid Management History:      Positive NCEP/ATP III risk factors include female age 32 years old or older, HDL cholesterol less than 40, and current tobacco user.  Negative NCEP/ATP III risk factors include no family history for ischemic heart disease.        Adjunctive measures started by the patient include omega-3 supplements.  Comments: HAs stopped fish oil..will restart. .    Preventive Screening-Counseling & Management  Alcohol-Tobacco     Smoking Cessation Counseling: yes     Packs/Day: <0.25     Tobacco Counseling: to quit use of tobacco products  Comments: Workoing on decreasing cigartettes slowly..not interested in med othe than wellbutrin she is on.   Allergies: 1)  * Niaspan 2)  * Oxycodone  Past History:  Past medical, surgical, family and social histories (including risk factors) reviewed, and no changes noted (except as noted below).  Past Medical  History: Reviewed history from 02/24/2010 and no changes required. Anxiety Depression GERD Hypothyroidism Skin cancers on face and arm Allergic rhinitis COPD Hyperlipidemia Hypertension Obesity DDD, mild lower back  Past Surgical History: Reviewed history from 04/30/2009 and no changes required. Tubal ligation   ~1975 Appendectomy EMGs atms and wrists --neg  7/04 R foot surgery--03/31/2009  Family History: Reviewed history from 03/20/2009 and no changes required. Father: deceased 62; skin cancer Mother: deceased 53; ? cause of death; HTN Siblings:living and healthy  Social History: Reviewed history from 03/06/2009 and no changes required. 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 CV:  Denies chest pain or discomfort. Resp:  Denies shortness of breath. GI:  Complains of constipation. GU:  Denies dysuria.  Physical Exam  General:  overweight female in NAD   Ears:  External ear exam shows no significant lesions or deformities.  Otoscopic examination reveals clear canals, tympanic membranes are intact bilaterally without bulging, retraction, inflammation or discharge. Hearing is grossly normal bilaterally. Nose:  External nasal examination shows no deformity or inflammation. Nasal mucosa are pink and moist without lesions or exudates. Mouth:  Oral mucosa and oropharynx without lesions or exudates.  Teeth in good repair. Neck:  no carotid bruit or  thyromegaly no cervical or supraclavicular lymphadenopathy  Lungs:  Normal respiratory effort, chest expands symmetrically. Lungs are clear to auscultation, no crackles or wheezes. Heart:  Normal rate and regular rhythm. S1 and S2 normal without gallop, murmur, click, rub or other extra sounds. Abdomen:   Bowel sounds positive,abdomen soft and non-tender without masses, organomegaly or hernias noted. Genitalia:  Pelvic Exam:        External: normal female genitalia without lesions or masses        Vagina: normal without lesions or masses        Cervix: normal without lesions or masses        Adnexa: normal bimanual exam without masses or fullness        Uterus: normal by palpation        Pap smear: not performed Msk:  deformity on right foot s/p surgeries Pulses:  R and L posterior tibial pulses are full and equal bilaterally  Extremities:  no edema Neurologic:  No cranial nerve deficits noted. Station and gait are normal. Plantar reflexes are down-going bilaterally. DTRs are symmetrical throughout. Sensory, motor and coordinative functions appear intact. Skin:  Intact without suspicious lesions or rashes Psych:  Cognition and judgment appear intact. Alert and cooperative with normal attention span and concentration. No apparent delusions, illusions, hallucinations   Impression & Recommendations:  Problem # 1:  Preventive Health Care (ICD-V70.0) The patient's preventative maintenance and recommended screening tests for an annual wellness exam were reviewed in full today. Brought up to date unless services declined.  Counselled on the importance of diet, exercise, and its role in overall health and mortality. The patient's FH and SH was reviewed, including their home life, tobacco status, and drug and alcohol status.     Problem # 2:  HYPOTHYROIDISM (ICD-244.9) Stable but pt symptomatic..will try to increase synthroid slightly to see if improvement in symptoms.  Her updated medication list for this problem includes:    Synthroid 112 Mcg Tabs (Levothyroxine sodium) .Marland Kitchen... 1 tab by mouth daily  Problem # 3:  HYPERCHOLESTEROLEMIA (ICD-272.0) Poor control .. info on diet and exercsie given. Encouraged exercise, weight loss, healthy eating habits. Restart fish oil...recheck in 3 months.    Problem # 4:  DEPRESSION (ICD-311) Well controlled. Continue current medication.  Her updated medication list for this problem includes:    Wellbutrin Xl 150 Mg Tb24 (Bupropion hcl) .Marland Kitchen... Take 3 every am by mouth for depression  Problem # 5:  OSTEOPENIA (ICD-733.90) See on X-rays.Marland Kitchendiscussed ca and vit D.  Problem # 6:  CONSTIPATION, CHRONIC (ICD-564.09) Increase exercsie, fiber and water in diet.   Complete Medication List: 1)  Nexium 40 Mg Cpdr (Esomeprazole magnesium) .... Take 1 capsule by mouth once a day 2)  Fexofenadine Hcl 180 Mg Tabs (Fexofenadine hcl) .Marland Kitchen.. 1 tab by mouth daily 3)  Fish Oil 1000 Mg Caps (Omega-3 fatty acids) .... 3 every day - on hold for now 4)  Wellbutrin Xl 150 Mg Tb24 (Bupropion hcl) .... Take 3 every am by mouth for depression 5)  Flonase 50 Mcg/act Susp (Fluticasone propionate) .... 2 sprays to each nostril daily 6)  Synthroid 112 Mcg Tabs (Levothyroxine sodium) .Marland Kitchen.. 1 tab by mouth daily 7)  Bc Fast Pain Relief 845-65 Mg Pack (Aspirin-caffeine) .Marland Kitchen.. 1 by mouth as needed 8)  Vitamin D 1000mg   .... Take two tablets once daily 9)  Proair Hfa 108 (90 Base) Mcg/act Aers (Albuterol sulfate) .... 2 puffs every 4 hrs as needed chest tightness or wheezing  10)  Flexeril 10 Mg Tabs (Cyclobenzaprine hcl) .... Take one by mouth three times a day as needed muscle spasm, sedation precautions  Other Orders: Radiology Referral (Radiology) Gastroenterology Referral (GI)  Lipid Assessment/Plan:      Based on NCEP/ATP III, the patient's risk factor category is "2 or more risk factors and a calculated 10 year CAD risk of > 20%".  The patient's lipid goals are as follows: Total cholesterol goal is 200; LDL cholesterol goal is 100; HDL cholesterol goal is 40; Triglyceride goal is 150.  Her LDL cholesterol goal has not been met.    Patient Instructions: 1)  Work on getting into regular exercsie routine... water exercise. 2)  Continue calcium and vit D 600 mg/400IU two times  a day.  3)   Restart fish oil 2000 mg divided daily. 4)   Work on low fat diet. 5)  Recheck fasting LIPIDS  in 3 months Dx 272.0    6)   Increase synthroid to 112 micrograms. 7)  Return for TSH in 4-6 weeks.  8)   Increase fiber in diet and water. 9)   Referral Appointment Information 10)  Day/Date: 11)  Time: 12)  Place/MD: 13)  Address: 14)  Phone/Fax: 15)  Patient given appointment information. Information/Orders faxed/mailed.  Prescriptions: SYNTHROID 112 MCG TABS (LEVOTHYROXINE SODIUM) 1 tab by mouth daily  #30 x 3   Entered and Authorized by:   Kerby Nora MD   Signed by:   Kerby Nora MD on 03/19/2010   Method used:   Electronically to        [facility] [id number] (retail)       [address]       [place], [place]  [address]       Ph: [phone] or [phone]       Fax: [phone]   RxID:   [id number]    Orders Added: 1)  Radiology Referral [Radiology] 2)  Gastroenterology Referral [GI] 3)  Est. Patient 40-64 years [[address]]    Current Allergies (reviewed today): * NIASPAN * OXYCODONE Flex Sig Next Due:  Not Indicated Last PAP:  Normal, Satisfactory (03/06/2009 5:30:00 PM) PAP Result Date:  03/19/2010 PAP Result:  DVE yearly pap q 2-3 years PAP Next Due:  1 yr

## 2010-06-30 NOTE — Consult Note (Signed)
Summary: [facility]/Dr[facility]/Dr. Newell Coral   Imported By: Eleonore Chiquito 04/24/2008 11:16:39  _____________________________________________________________________  External Attachment:    Type:   Image     Comment:   External Document

## 2010-06-30 NOTE — Letter (Signed)
Summary: Tiffany Orozco Instructions  [facility]  [address] [place], [place] [address]   Phone: [phone]  Fax: [phone]       [name]    09/28/1950    MRN: [id number]       Procedure Day /Date: Tuesday 05-05-10     Arrival Time:  9:30 a.m.     Procedure Time: 10:30 a.m.     Location of Procedure:                    _x _  [facility] (4th Floor)  PREPARATION FOR COLONOSCOPY WITH MIRALAX  Starting 5 days prior to your procedure  04-30-10 do not eat nuts, seeds, popcorn, corn, beans, peas,  salads, or any raw vegetables.  Do not take any fiber supplements (e.g. Metamucil, Citrucel, and Benefiber). ____________________________________________________________________________________________________   THE DAY BEFORE YOUR PROCEDURE         DATE:  05-04-10  DAY:  Monday  1   Drink clear liquids the entire day-NO SOLID FOOD  2   Do not drink anything colored red or purple.  Avoid juices with pulp.  No orange juice.  3   Drink at least 64 oz. (8 glasses) of fluid/clear liquids during the day to prevent dehydration and help the prep work efficiently.  CLEAR LIQUIDS INCLUDE: Water Jello Ice Popsicles Tea (sugar ok, no milk/cream) Powdered fruit flavored drinks Coffee (sugar ok, no milk/cream) Gatorade Juice: apple, white grape, white cranberry  Lemonade Clear bullion, consomm, broth Carbonated beverages (any kind) Strained chicken noodle soup Hard Candy  4   Mix the entire bottle of Miralax with 64 oz. of Gatorade/Powerade in the morning and put in the refrigerator to chill.  5   At 3:00 pm take 2 Dulcolax/Bisacodyl tablets.  6   At 4:30 pm take one Reglan/Metoclopramide tablet.  7  Starting at 5:00 pm drink one 8 oz glass of the Miralax mixture every 15-20 minutes until you have finished drinking the entire 64 oz.  You should finish drinking prep around 7:30 or 8:00 pm.  8   If you are nauseated, you may take the 2nd  Reglan/Metoclopramide tablet at 6:30 pm.        9    At 8:00 pm take 2 more DULCOLAX/Bisacodyl tablets.     THE DAY OF YOUR PROCEDURE      DATE:   05-05-10   DAY:  Tuesday  You may drink clear liquids until  8:30 a.m.   (2 HOURS BEFORE PROCEDURE).   MEDICATION INSTRUCTIONS  Unless otherwise instructed, you should take regular prescription medications with a small sip of water as early as possible the morning of your procedure.          OTHER INSTRUCTIONS  You will need a responsible adult at least 62 years of age to accompany you and drive you home.   This person must remain in the waiting room during your procedure.  Wear loose fitting clothing that is easily removed.  Leave jewelry and other valuables at home.  However, you may wish to bring a book to read or an iPod/MP3 player to listen to music as you wait for your procedure to start.  Remove all body piercing jewelry and leave at home.  Total time from sign-in until discharge is approximately 2-3 hours.  You should go home directly after your procedure and rest.  You can resume normal activities the day after your procedure.  The day of your  procedure you should not:   Drive   Make legal decisions   Operate machinery   Drink alcohol   Return to work  You will receive specific instructions about eating, activities and medications before you leave.   The above instructions have been reviewed and explained to me by   Ezra Sites RN  April 20, 2010 1:29 PM     I fully understand and can verbalize these instructions _____________________________ Date _______

## 2010-06-30 NOTE — Medication Information (Signed)
Summary: Application for Free [employer] [facility]  Application for [employer] [facility]   Imported By: Beau Fanny 10/10/2009 10:32:27  _____________________________________________________________________  External Attachment:    Type:   Image     Comment:   External Document

## 2010-06-30 NOTE — Assessment & Plan Note (Signed)
Summary: 2:30 COUGH,DRAINAGE/CLE    Vital Signs:  Patient Profile:   60 Years Old Female Height:     60 inches Weight:      177.38 pounds Temp:     98.4 degrees F oral Pulse rate:   92 / minute Pulse rhythm:   regular BP sitting:   146 / 80  (left arm) Cuff size:   large  Vitals Entered By: Delilah Shan (August 14, 2007 2:28 PM)                 Chief Complaint:  S.T, cough, congestion, currently on Bupropion 300 mg, and qd but will go to the dosage on meds list as soon as insurance approves..  Acute Visit History:      The patient complains of cough, fever, nasal discharge, and sore throat.  These symptoms began 1 week ago.  She denies earache and sinus problems.  Other comments include: chest congestion, chest wall and back  pain with cough.        Her highest temperature has been 99.8, low grade.        The patient notes wheezing and shortness of breath.  The cough interferes with her sleep.  The character of the cough is described as productive.  She has a history of COPD.  There is no history of cyanosis associated with her cough.          Current Allergies (reviewed today): * NIASPAN * OXYCODONE  Past Medical History:    Reviewed history from 02/27/2007 and no changes required:       Anxiety       Depression       GERD       Hypothyroidism       Skin cancers on face and arm       Allergic rhinitis       COPD   Social History:    Marital Status: Married    Children:     Occupation: bookkeeper    Current Smoker, 1/2 pack a day   Risk Factors:  Tobacco use:  current   Review of Systems      See HPI   Physical Exam  General:     Well-developed,well-nourished,in no acute distress; alert,appropriate and cooperative throughout examination Ears:     clear fluid b  TMs Nose:     nasal dischargemucosal pallor.   Mouth:     dark black  1 mm lesion on palate and under tounge per pt only noted since 12/08 Lungs:     Normal respiratory effort, chest  expands symmetrically. Lungs are clear to auscultation, no crackles or wheezes. Heart:     Normal rate and regular rhythm. S1 and S2 normal without gallop, murmur, click, rub or other extra sounds. Cervical Nodes:     No lymphadenopathy noted    Impression & Recommendations:  Problem # 1:  BRONCHITIS-ACUTE (ICD-466.0) Continue delsym.  Treat with antibiotics, call if not improving. The following medications were removed from the medication list:    Avelox 400 Mg Tabs (Moxifloxacin hcl) .Marland Kitchen... 1 once daily--antibiodic  Her updated medication list for this problem includes:    Albuterol 90 Mcg/act Aers (Albuterol) .Marland Kitchen... As needed    Zithromax Z-pak 250 Mg Tabs (Azithromycin) .Marland Kitchen... As directed   Problem # 2:  OTHER&UNSPECIFIED DISEASES THE ORAL SOFT TISSUES (ICD-528.9) lesion on palate and under tounge, ? benign oral.  Given that she is a smoker recommend follow up woith primary in 1 month  and possible referral to ENT if lesions changing.  Complete Medication List: 1)  Nexium 40 Mg Cpdr (Esomeprazole magnesium) .... Take 1 capsule by mouth once a day 2)  Flonase 50 Mcg/act Susp (Fluticasone propionate) .... Use as directed 3)  Armour Thyroid 90 Mg  .... One by mouth qd 4)  Zyrtec Allergy 10 Mg Tabs (Cetirizine hcl) .... Take 1 tablet by mouth once a day as needed 5)  Fish Oil 1000 Mg Caps (Omega-3 fatty acids) .... 3 every day 6)  Wellbutrin Xl 150 Mg Tb24 (Bupropion hcl) .... Take 3 qam by mouth for depression 7)  Gabapentin 100 Mg Caps (Gabapentin) .... Take 1 capsule by mouth two times a day 8)  Meloxicam 15 Mg Tabs (Meloxicam) .... Take 1 tablet by mouth once a day 9)  Albuterol 90 Mcg/act Aers (Albuterol) .... As needed 10)  Zithromax Z-pak 250 Mg Tabs (Azithromycin) .... As directed     Prescriptions: ZITHROMAX Z-PAK 250 MG  TABS (AZITHROMYCIN) as directed  #1 pack x 0   Entered and Authorized by:   Kerby Nora MD   Signed by:   Kerby Nora MD on 08/14/2007   Method used:    Electronically sent to ...       [facility]*       [address]       [place], [place]  [address]       Ph: [phone] or [phone]       Fax: [phone]   RxID:   [id number]  ] Current Allergies (reviewed today): * NIASPAN * OXYCODONE Current Medications (including changes made in today's visit):  NEXIUM 40 MG CPDR (ESOMEPRAZOLE MAGNESIUM) Take 1 capsule by mouth once a day FLONASE 50 MCG/ACT  SUSP (FLUTICASONE PROPIONATE) use as directed * ARMOUR THYROID 90 MG one by mouth qd ZYRTEC ALLERGY 10 MG  TABS (CETIRIZINE HCL) Take 1 tablet by mouth once a day as needed FISH OIL 1000 MG  CAPS (OMEGA-3 FATTY ACIDS) 3 every day WELLBUTRIN XL 150 MG  TB24 (BUPROPION HCL) take 3 qam by mouth for depression GABAPENTIN 100 MG  CAPS (GABAPENTIN) Take 1 capsule by mouth two times a day MELOXICAM 15 MG  TABS (MELOXICAM) Take 1 tablet by mouth once a day ALBUTEROL 90 MCG/ACT  AERS (ALBUTEROL) as needed ZITHROMAX Z-PAK 250 MG  TABS (AZITHROMYCIN) as directed

## 2010-06-30 NOTE — Assessment & Plan Note (Signed)
Summary: bronchitis/hmw   Vital Signs:  Patient profile:   60 year old female Height:      60 inches Weight:      181.38 pounds BMI:     35.55 Temp:     97.9 degrees F oral Pulse rate:   84 / minute Pulse rhythm:   regular BP sitting:   140 / 70  (left arm) Cuff size:   regular  Vitals Entered By: Linde Gillis CMA Duncan Dull) (July 28, 2009 4:19 PM) CC: ? bronchitis   History of Present Illness: 60 year old female:  pleasant female, who presents with a approximate two-day history of severe cough, causing pain.  History is significant for COPD and long-standing smoking history.  She felt as if she was having some significant wheezing and chest tightness last night. Did try using her albuterol inhaler once, which provided some relief.   Currently she does have a sore throat as well.  Cough and sore throat came on yesterday evening  Hurting to cough. Drinking OK.   ros No fever. No nausea, vomiting, diarrhea. no rashes. Eating and drinking normally.  GEN: A and O x 3. WDWN. NAD.    ENT: Nose clear, ext NML.  No LAD.  No JVD.  TM's clear. Oropharynx clear.  PULM: Normal WOB, no distress. No crackles, wheezes, rhonchi. CV: RRR, no M/G/R, No rubs, No JVD.   ABD: S, NT, ND, + BS. No rebound. No guarding. No HSM.   EXT: warm and well-perfused, No c/c/e. PSYCH: Pleasant and conversant.     Allergies: 1)  * Niaspan 2)  * Oxycodone  Past History:  Past medical, surgical, family and social histories (including risk factors) reviewed, and no changes noted (except as noted below).  Past Medical History: Reviewed history from 03/20/2009 and no changes required. Anxiety Depression GERD Hypothyroidism Skin cancers on face and arm Allergic rhinitis COPD Hyperlipidemia Hypertension Obesity  Past Surgical History: Reviewed history from 04/30/2009 and no changes required. Tubal ligation   ~1975 Appendectomy EMGs atms and wrists --neg  7/04 R foot  surgery--03/31/2009  Family History: Reviewed history from 03/20/2009 and no changes required. Father: deceased 64; skin cancer Mother: deceased 34; ? cause of death; HTN Siblings:living and healthy  Social History: Reviewed history from 03/06/2009 and no changes required. 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  Treat with antibiotics, and albuterol as needed.  We discussed going on a short course of prednisone, which was my recommendation, and the patient  declined.  She is not actively wheezing, however I gave her strict instructions to call if she felt as if she was wheezing more common to use her albuterol currently, and  if she does have some more wheezing, would call in a week prednisone burst  The following medications were removed from the medication list:    Augmentin 875-125 Mg Tabs (Amoxicillin-pot clavulanate) .Marland Kitchen... Take 1 two times a day Her updated medication list for this problem includes:    Aleve Cold & Sinus 120-220 Mg Xr12h-tab (Pseudoephedrine-naproxen na) .Marland Kitchen... 1 by mouth as needed    Proair Hfa 108 (90 Base) Mcg/act Aers (Albuterol sulfate) .Marland Kitchen... 2 puffs every 4 hrs as needed chest tightness or wheezing    Azithromycin 250 Mg Tabs (Azithromycin) .Marland KitchenMarland KitchenMarland KitchenMarland Kitchen 2  by  mouth today and then 1 daily for 4 days  Problem # 2:  CHRONIC OBSTRUCTIVE PULMONARY DISEASE, ACUTE EXACERBATION (ICD-491.21) Assessment: Deteriorated  Problem # 3:  TOBACCO ABUSE (ICD-305.1) Assessment: Unchanged  Complete Medication List: 1)  Nexium 40 Mg Cpdr (Esomeprazole magnesium) .... Take 1 capsule by mouth once a day 2)  Zyrtec Allergy 10 Mg Tabs (Cetirizine hcl) .... Take 1 tablet by mouth once a day 3)  Fish  Oil 1000 Mg Caps (Omega-3 fatty acids) .... 3 every day - on hold for now 4)  Wellbutrin Xl 150 Mg Tb24 (Bupropion hcl) .... Take 3 every am by mouth for depression 5)  Diclofenac Sodium 75 Mg Tbec (Diclofenac sodium) .... Take 1 tablet by mouth every 12 hours as needed 6)  Flonase 50 Mcg/act Susp (Fluticasone propionate) .... 2 sprays to each nostril daily 7)  Synthroid 100 Mcg Tabs (Levothyroxine sodium) .Marland Kitchen.. 1 daily for thyroid 8)  Aleve Cold & Sinus 120-220 Mg Xr12h-tab (Pseudoephedrine-naproxen na) .Marland Kitchen.. 1 by mouth as needed 9)  Bc Fast Pain Relief 845-65 Mg Pack (Aspirin-caffeine) .Marland Kitchen.. 1 by mouth as needed 10)  Vitamin D 1000mg   .... Take two tablets once daily 11)  Proair Hfa 108 (90 Base) Mcg/act Aers (Albuterol sulfate) .... 2 puffs every 4 hrs as needed chest tightness or wheezing 12)  Azithromycin 250 Mg Tabs (Azithromycin) .... 2 by  mouth today and then 1 daily for 4 days Prescriptions: AZITHROMYCIN 250 MG  TABS (AZITHROMYCIN) 2 by  mouth today and then 1 daily for 4 days  #6 x 0   Entered and Authorized by:   Hannah Beat MD   Signed by:   Hannah Beat MD on 07/28/2009   Method used:   [facility] to        [facility] (retail)       [address]       [place], [place]  [address]       Ph: [phone] or [phone]       Fax: [phone]   RxID:   [id number]   Current Allergies (reviewed today): * NIASPAN * OXYCODONE

## 2010-06-30 NOTE — Progress Notes (Signed)
Summary: Rx's for patient assistance program Human resources officer)  Phone Note Call from Patient Call back at Home Phone [phone]   Caller: Patient Call For: Dr. Hetty Ely Summary of Call: Patient came in the office today requesting forms to be filled out for her Rx assistance program.  All of the paper work is complete the only other thing that needs to be included are Rx's for Nexium 40mg , Flonase 50mg , and Albuterol .  Please advise. Initial call taken by: Linde Gillis CMA Duncan Dull),  Oct 09, 2009 2:39 PM  Follow-up for Phone Call        I'm assuming monthly scripts are needed. These were done. Pt needs to establish with her new doctor as soon as possible. Follow-up by: Shaune Leeks MD,  Oct 09, 2009 4:50 PM  Additional Follow-up for Phone Call Additional follow up Details #1::        Patient advised as instructed.  She said she will make an appt with another provider when she come in today to pick up her paper work.  Paper work left at front desk for pick up. Additional Follow-up by: Linde Gillis CMA Duncan Dull),  Oct 10, 2009 9:05 AM    Prescriptions: PROAIR HFA 108 (90 BASE) MCG/ACT AERS (ALBUTEROL SULFATE) 2 puffs every 4 hrs as needed chest tightness or wheezing  #1 x 1   Entered and Authorized by:   Shaune Leeks MD   Signed by:   Shaune Leeks MD on 10/09/2009   Method used:   Printed then faxed to ...       [facility] ([facility]ail)       [address]       [place], [place]  [address]       Ph: [phone] or [phone]       Fax: [phone]   RxID:   [id number] FLONASE 50 MCG/ACT SUSP (FLUTICASONE PROPIONATE) 2 sprays to each nostril daily  #61mdi x 12   Entered and Authorized by:   Shaune Leeks MD   Signed by:   Shaune Leeks MD on 10/09/2009   Method used:   Printed then faxed to ...       [facility] (retail)       [address]       [place], [place]  [address]       Ph: [phone] or  [phone]       Fax: [phone]   RxID:   [id number] NEXIUM 40 MG CPDR (ESOMEPRAZOLE MAGNESIUM) Take 1 capsule by mouth once a day  #30 x 12   Entered and Authorized by:   Shaune Leeks MD   Signed by:   Shaune Leeks MD on 10/09/2009   Method used:   Printed then faxed [facility] ...       [facility]       [address]       [place], [place]  [address]       Ph: [phone] or [phone]       Fax: [phone]   RxID:   [id number]   Appended Document: Rx's for patient assistance program Human resources officer) needs written px   Clinical Lists Changes  Medications: Rx of NEXIUM 40 MG CPDR (ESOMEPRAZOLE MAGNESIUM) Take 1 capsule by mouth once a day;  #90 x 0;  Signed;  Entered by: Judith Part MD;  Authorized by: Judith Part MD;  Method used: Print then [employer]  to Patient Rx of PROAIR HFA 108 (90 BASE) MCG/ACT AERS (ALBUTEROL SULFATE) 2 puffs every 4 hrs as needed chest tightness or wheezing;  #3 mdi x 3;  Signed;  Entered by: Judith Part MD;  Authorized by: Judith Part MD;  Method used: Print then Give to Patient Rx of FLONASE 50 MCG/ACT SUSP (FLUTICASONE PROPIONATE) 2 sprays to each nostril daily;  #3 mdi x 3;  Signed;  Entered by: Judith Part MD;  Authorized by: Judith Part MD;  Method used: Print then Give to Patient    Prescriptions: FLONASE 50 MCG/ACT SUSP (FLUTICASONE PROPIONATE) 2 sprays to each nostril daily  #3 mdi x 3   Entered and Authorized by:   Judith Part MD   Signed by:   Judith Part MD on 10/10/2009   Method used:   Print then Give to Patient   RxID:   [id number] PROAIR HFA 108 (90 BASE) MCG/ACT AERS (ALBUTEROL SULFATE) 2 puffs every 4 hrs as needed chest tightness or wheezing  #3 mdi x 3   Entered and Authorized by:   Judith Part MD   Signed by:   Judith Part MD on 10/10/2009   Method used:   Print then Give to Patient   RxID:   [id number] NEXIUM 40 MG CPDR (ESOMEPRAZOLE  MAGNESIUM) Take 1 capsule by mouth once a day  #90 x 0   Entered and Authorized by:   Judith Part MD   Signed by:   Judith Part MD on 10/10/2009   Method used:   Print then Give to Patient   RxID:   [id number]

## 2010-06-30 NOTE — Assessment & Plan Note (Signed)
Summary: CHECK PLACE ON BOTTOM/CLE   Vital Signs:  Patient profile:   60 year old female Height:      60 inches Weight:      173 pounds Temp:     98.4 degrees F oral Pulse rate:   80 / minute Pulse rhythm:   regular BP sitting:   140 / 80  (left arm) Cuff size:   regular  Vitals Entered By: Providence Crosby LPN (January 13, 2009 12:14 PM) CC: rash end of sacrum right buttock region painful itching// check place on left side of mouth   History of Present Illness: Pt here for lesion on the side of her right ankle where she has had previous surgery by Dr Al Corpus. She has seen Dr Lestine Box for eval. She also has a hammer toe repair from which she has chronic pain. She ius also worried about tendonitis. She wants to be seen again. She is here for lesions on the upper area of the rectal cleft  theat started as one hard pimple and then became a blister and now is an ulcer with 3 sattelite lesions. She denies fever or chills. She has been using Neosporin on it.  Problems Prior to Update: 1)  Headache  (ICD-784.0) 2)  Wheezing  (ICD-786.07) 3)  Chronic Obstructive Pulmonary Disease, Acute Exacerbation  (ICD-491.21) 4)  Foot Pain  (ICD-729.5) 5)  Sciatica, Left  (ICD-724.3) 6)  Other&unspecified Diseases The Oral Soft Tissues  (ICD-528.9) 7)  COPD  (ICD-496) 8)  Allergic Rhinitis  (ICD-477.9) 9)  Depression  (ICD-311) 10)  Uri  (ICD-465.9) 11)  Serous Otitis  (ICD-381.4) 12)  Well Adult  (ICD-V70.0) 13)  Preoperative Examination  (ICD-V72.84) 14)  Hx of Tobacco Abuse  (ICD-305.1) 15)  Hx of Hypercholesterolemia  (ICD-272.0) 16)  Hypothyroidism  (ICD-244.9) 17)  Gerd  (ICD-530.81) 18)  Depression  (ICD-311) 19)  Anxiety  (ICD-300.00)  Medications Prior to Update: 1)  Nexium 40 Mg Cpdr (Esomeprazole Magnesium) .... Take 1 Capsule By Mouth Once A Day 2)  Zyrtec Allergy 10 Mg  Tabs (Cetirizine Hcl) .... Take 1 Tablet By Mouth Once A Day As Needed 3)  Fish Oil 1000 Mg  Caps (Omega-3 Fatty Acids)  .... 3 Every Day 4)  Wellbutrin Xl 150 Mg  Tb24 (Bupropion Hcl) .... Take 3 Every Am By Mouth For Depression 5)  Albuterol 90 Mcg/act  Aers (Albuterol) .... As Needed 6)  Diclofenac Sodium 75 Mg  Tbec (Diclofenac Sodium) .... Take 1 Tablet By Mouth Every 12 Hours 7)  Flonase 50 Mcg/act Susp (Fluticasone Propionate) .... 2 Sprays To Each Nostril Daily 8)  Synthroid 100 Mcg Tabs (Levothyroxine Sodium) .Marland Kitchen.. 1 Daily For Thyroid  Allergies: 1)  * Niaspan 2)  * Oxycodone  Physical Exam  General:  alert, well-developed, well-nourished, well-hydrated, and overweight-appearing.  NAD Extremities:  R ankle with cluster of soft bulbous lesions 2 cm in diam in cluster of 4-5 just anterior to the lateral malleolus. Skin:  Top of gluteal cleft sinif for 1cm open ulcer on the right side and 2 amall red papules on the left at the level of the occlusive skin of the ulcer on the right.    Impression & Recommendations:  Problem # 1:  FOOT PAIN (ICD-729.5) R foot and ankle.  Post surgicla pain from previous multiple hammer toe operations. BPoss neuroma of lateral ankle.  Refer to Dr Lestine Box. Orders: Orthopedic Referral (Ortho)  Problem # 2:  CELLULITIS AND ABSCESS OF BUTTOCK (ICD-682.5) Assessment: New  Poss MRSA. Bact and Viral cultures sent. Her updated medication list for this problem includes:    Bactrim Ds 800-160 Mg Tabs (Sulfamethoxazole-trimethoprim) .Marland Kitchen... 2 tabs by mouth two times a day  Orders: T- * Misc. Laboratory test [phone]) T- * Misc. Laboratory test [phone])  Complete Medication List: 1)  Nexium 40 Mg Cpdr (Esomeprazole magnesium) .... Take 1 capsule by mouth once a day 2)  Zyrtec Allergy 10 Mg Tabs (Cetirizine hcl) .... Take 1 tablet by mouth once a day as needed 3)  Fish Oil 1000 Mg Caps (Omega-3 fatty acids) .... 3 every day 4)  Wellbutrin Xl 150 Mg Tb24 (Bupropion hcl) .... Take 3 every am by mouth for depression 5)  Albuterol 90 Mcg/act Aers (Albuterol) .... As needed 6)   Diclofenac Sodium 75 Mg Tbec (Diclofenac sodium) .... Take 1 tablet by mouth every 12 hours 7)  Flonase 50 Mcg/act Susp (Fluticasone propionate) .... 2 sprays to each nostril daily 8)  Synthroid 100 Mcg Tabs (Levothyroxine sodium) .Marland Kitchen.. 1 daily for thyroid 9)  Bactrim Ds 800-160 Mg Tabs (Sulfamethoxazole-trimethoprim) .... 2 tabs by mouth two times a day  Patient Instructions: 1)  RTC 10 days 2)  Refer to Dr Lestine Box Prescriptions: BACTRIM DS 800-160 MG TABS (SULFAMETHOXAZOLE-TRIMETHOPRIM) 2 tabs by mouth two times a day  #40 x 0   Entered and Authorized by:   Shaune Leeks MD   Signed by:   Shaune Leeks MD on 01/13/2009   Method used:   Electronically to        [facility]* (retail)       [address]       [place], [place]  [address]       Ph: [phone]       Fax: [phone]   RxID:   [id number] SYNTHROID 100 MCG TABS (LEVOTHYROXINE SODIUM) 1 daily for thyroid  #90 x 0   Entered by:   Providence Crosby LPN   Authorized by:   Shaune Leeks MD   Signed by:   Providence Crosby LPN on 25/95/6387   Method used:   Electronically to        [facility]* (retail)       [address]       [place], [place]  [address]       Ph: [phone]       Fax: [phone]   RxID:   [id number]

## 2010-06-30 NOTE — Progress Notes (Signed)
Summary: med refill  Phone Note Call from Patient   Caller: pt Call For: Billie Summary of Call: pt has been trying to get med refill for bupropion xl 150 mg filled all week. she is taking 150 mg 3 every day. is this ok to refill? she uses [facility] [phone] Initial call taken by: Cooper Render,  July 27, 2007 8:46 AM  Follow-up for Phone Call        called [facility], will Rx wellbutrin XL 150--take 3 qam --generic--#90/6 refills...swill correct med sheet.   ..................................................................Marland KitchenBillie-Lynn Tyler Deis FNP  July 27, 2007 2:02 PM     New/Updated Medications: WELLBUTRIN XL 150 MG  TB24 (BUPROPION HCL) take 3 qam by mouth for depression   Prescriptions: WELLBUTRIN XL 150 MG  TB24 (BUPROPION HCL) take 3 qam by mouth for depression  #90 x 6   Entered and Authorized by:   Gildardo Griffes FNP   Signed by:   Gildardo Griffes FNP on 07/27/2007   Method used:   Print then Give to Patient   RxID:   [id number]

## 2010-06-30 NOTE — Letter (Signed)
Summary: [facility]  [facility]   Imported By: Lanelle Bal 01/28/2009 08:35:36  _____________________________________________________________________  External Attachment:    Type:   Image     Comment:   External Document

## 2010-06-30 NOTE — Assessment & Plan Note (Signed)
Summary: 30 MIN APPT FLUID IN EARS/BILLIES PATIENT/[facility]   Vital Signs:  Patient profile:   60 year old female Height:      60 inches Weight:      182.4 pounds BMI:     35.75 Temp:     98.0 degrees F oral Pulse rate:   84 / minute Pulse rhythm:   regular BP sitting:   130 / 80  (left arm) Cuff size:   regular  Vitals Entered By: Benny Lennert CMA Duncan Dull) (Oct 21, 2009 3:47 PM)  History of Present Illness: Chief complaint fluid build up in ears  Both ears feel full of fluid for several months. Occ sharp pain in left ear. Some sneeze, itchy watery eyes.  No ringing in ears, ? mld decrease in hearing in B ears.  Using ceterizine and fluticasone daily year round.  Irritated skin lesion on left neck and central back..Neck irritated with seat bealt. BAck itchy..scratches it off. Noted several months ago, no change in color.  Pain in last few years when sitting back.  Worse when constipa..constant in "tailbone"ted..improved with BM. no pain when sits forward or on her legs.   Problems Prior to Update: 1)  Sacroiliitis Not Elsewhere Classified  (ICD-720.2) 2)  Skin Lesion, Benign  (ICD-709.9) 3)  Eustachian Tube Dysfunction, Bilateral  (ICD-381.81) 4)  Skin Tag  (ICD-701.9) 5)  Memory Loss  (ICD-780.93) 6)  Precordial Pain  (ICD-786.51) 7)  Murmur  (ICD-785.2) 8)  Palpitations  (ICD-785.1) 9)  Dyspnea  (ICD-786.05) 10)  Chest Tightness-pressure-other  ([id number]) 11)  Tobacco Abuse  (ICD-305.1) 12)  Postmenopausal Status  (ICD-V49.81) 13)  Unspecified Cardiac Dysrhythmia  (ICD-427.9) 14)  Sprain and Strain of Sacral Area  (ICD-846.0) 15)  Headache  (ICD-784.0) 16)  Wheezing  (ICD-786.07) 17)  Chronic Obstructive Pulmonary Disease, Acute Exacerbation  (ICD-491.21) 18)  Foot Pain  (ICD-729.5) 19)  Sciatica, Left  (ICD-724.3) 20)  Other&unspecified Diseases The Oral Soft Tissues  (ICD-528.9) 21)  COPD  (ICD-496) 22)  Allergic Rhinitis  (ICD-477.9) 23)  Depression   (ICD-311) 24)  Uri  (ICD-465.9) 25)  Serous Otitis  (ICD-381.4) 26)  Well Adult  (ICD-V70.0) 27)  Preoperative Examination  (ICD-V72.84) 28)  Hx of Tobacco Abuse  (ICD-305.1) 29)  Hx of Hypercholesterolemia  (ICD-272.0) 30)  Hypothyroidism  (ICD-244.9) 31)  Gerd  (ICD-530.81) 32)  Depression  (ICD-311) 33)  Anxiety  (ICD-300.00)  Current Medications (verified): 1)  Nexium 40 Mg Cpdr (Esomeprazole Magnesium) .... Take 1 Capsule By Mouth Once A Day 2)  Zyrtec Allergy 10 Mg  Tabs (Cetirizine Hcl) .... Take 1 Tablet By Mouth Once A Day 3)  Fish Oil 1000 Mg  Caps (Omega-3 Fatty Acids) .... 3 Every Day - On Hold For Now 4)  Wellbutrin Xl 150 Mg  Tb24 (Bupropion Hcl) .... Take 3 Every Am By Mouth For Depression 5)  Flonase 50 Mcg/act Susp (Fluticasone Propionate) .... 2 Sprays To Each Nostril Daily 6)  Synthroid 100 Mcg Tabs (Levothyroxine Sodium) .Marland Kitchen.. 1 Daily For Thyroid 7)  Aleve Cold & Sinus 120-220 Mg Xr12h-Tab (Pseudoephedrine-Naproxen Na) .Marland Kitchen.. 1 By Mouth As Needed 8)  Bc Fast Pain Relief 845-65 Mg Pack (Aspirin-Caffeine) .Marland Kitchen.. 1 By Mouth As Needed 9)  Vitamin D 1000mg  .... Take Two Tablets Once Daily 10)  Proair Hfa 108 (90 Base) Mcg/act Aers (Albuterol Sulfate) .... 2 Puffs Every 4 Hrs As Needed Chest Tightness or Wheezing  Allergies: 1)  * Niaspan 2)  * Oxycodone  Past History:  Past medical, surgical, family and social histories (including risk factors) reviewed, and no changes noted (except as noted below).  Past Medical History: Reviewed history from 03/20/2009 and no changes required. Anxiety Depression GERD Hypothyroidism Skin cancers on face and arm Allergic rhinitis COPD Hyperlipidemia Hypertension Obesity  Past Surgical History: Reviewed history from 04/30/2009 and no changes required. Tubal ligation   ~1975 Appendectomy EMGs atms and wrists --neg  7/04 R foot surgery--03/31/2009  Family History: Reviewed history from 03/20/2009 and no changes  required. Father: deceased 44; skin cancer Mother: deceased 85; ? cause of death; HTN Siblings:living and healthy  Social History: Reviewed history from 03/06/2009 and no changes required. 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 CV:  Denies chest pain or discomfort. Resp:  Denies shortness of breath, sputum productive, and wheezing. GI:  Denies abdominal pain. GU:  Denies dysuria.  Physical Exam  General:  alert, well-developed, well-nourished, well-hydrated, and overweight-appearing.  NAD Head:  no ttp maxillary sinus  Ears:  clear fluid b TNMs, no bulging, no exudate, no erythema. Nose:  nasal discharge, mucosal pallor.   Mouth:  Oral mucosa and oropharynx without lesions or exudates.  Teeth in good repair. Neck:  no cervical or supraclavicular lymphadenopathy  no carotid bruit or thyromegaly  Lungs:  Normal respiratory effort, chest expands symmetrically. Lungs are clear to auscultation, no crackles or wheezes. Heart:  Normal rate and regular rhythm. S1 and S2 normal without gallop, murmur, click, rub or other extra sounds. Msk:  pos Faber's in sacroiliac joint, mild ttp coccyx decrease  ROM B hips, but stiff Pulses:  R and L posterior tibial pulses are full and equal bilaterally  Extremities:  no edema Skin:  scab on central back, no worrisome lesion  left neck skin tag    Impression & Recommendations:  Problem # 1:  EUSTACHIAN TUBE DYSFUNCTION, BILATERAL (ICD-381.81) Increase nasal steroid and get better control of allergies with fexofenadine. Follow up if no improvement   Problem # 2:  SKIN LESION, BENIGN (ICD-709.9) Scab..no clear abnormal lesion. Treat with cortisone cream for inflammmtation and itching. If not resolving follow up.   Problem # 3:   SACROILIITIS NOT ELSEWHERE CLASSIFIED (ICD-720.2) no clear ilitis.Marland Kitchenmore  sacroiliac tightness. Treat with NSAIDs, gentle stretching exercises and heat.  Alos ttp coccyxitself..only injury 10 years ago...if not improving consider X-ray.  Problem # 4:  SKIN TAG (ICD-701.9)  Orders: Cryotherapy/Destruction benign or premalignant lesion (1st lesion)  (17000)  Complete Medication List: 1)  Nexium 40 Mg Cpdr (Esomeprazole magnesium) .... Take 1 capsule by mouth once a day 2)  Fexofenadine Hcl 180 Mg Tabs (Fexofenadine hcl) .Marland Kitchen.. 1 tab by mouth daily 3)  Fish Oil 1000 Mg Caps (Omega-3 fatty acids) .... 3 every day - on hold for now 4)  Wellbutrin Xl 150 Mg Tb24 (Bupropion hcl) .... Take 3 every am by mouth for depression 5)  Diclofenac Sodium 75 Mg Tbec (Diclofenac sodium) .... Take 1 tablet by mouth every 12 hours as needed 6)  Flonase 50 Mcg/act Susp (Fluticasone propionate) .... 2 sprays to each nostril daily 7)  Synthroid 100 Mcg Tabs (Levothyroxine sodium) .Marland Kitchen.. 1 daily for thyroid 8)  Bc Fast Pain Relief 845-65 Mg Pack (Aspirin-caffeine) .Marland Kitchen.. 1 by mouth as needed 9)  Vitamin D 1000mg   .... Take two tablets once daily 10)  Proair Hfa 108 (90 Base) Mcg/act Aers (Albuterol sulfate) .... 2 puffs every 4 hrs as needed chest tightness or wheezing  Patient Instructions: 1)  Cortisone 10... two times a day for 2 weeks... call ifskin lesion not  resolved. 2)  Quit smoking. 3)  Increase to 2 sprays per nostril daily. 4)  Stop zyrtec..change to allegra (fexofenadine) 5)  Schedule CPX in next 5 months with [facility]. 6)  Start  gentle hip stretching exercsies for sacroiliac tenderness and tailbone pain. Use diclofenac as needed.  Prescriptions: DICLOFENAC SODIUM 75 MG  TBEC (DICLOFENAC SODIUM) Take 1 tablet by mouth every 12 hours as needed  #30 x 0   Entered and Authorized by:   Kerby Nora MD   Signed by:   Kerby Nora MD on 10/21/2009   Method used:   [facility] [id number]       [address]       [place], [place]  [address]       Ph: [phone] or [phone]       Fax: [phone]   RxID:   [id number] FEXOFENADINE HCL 180 MG TABS (FEXOFENADINE HCL) 1 tab by mouth daily  #90 x 3   Entered and Authorized by:   Kerby Nora MD   Signed by:   Kerby Nora MD on 10/21/2009   Method used:   [facility]        [facility] [facility]       [address]       [place], [place]  [address]       Ph: [phone] or [phone]       Fax: [phone]   RxID:   [id number]   Current Allergies (reviewed today): * NIASPAN * OXYCODONE

## 2010-06-30 NOTE — Assessment & Plan Note (Signed)
Summary: EAR,CONGESTION/CLE   Vital Signs:  Patient Profile:   60 Years Old Female Height:     60 inches Temp:     99.3 degrees F oral Pulse rate:   95 / minute BP sitting:   144 / 102  (left arm)  Vitals Entered By: Cooper Render (June 07, 2007 4:34 PM)                 Chief Complaint:  URI sx, L) ear pain, teeth hurt, and HA's.  History of Present Illness: Here due to URI sx--ears off and on for 1+wk, now constant.  Has tooth ache--to see dentist tomorrow. Low grade fever/chills, no ST or cough, some nasal congestion.  Having major finncial problems. Husband had 2 new stents placved recently--now not able to drive big trucks as he previously did.  Had surg on R 2-3-4 toes again recently, in moon boot with pins in ends of each of these toes.  Current Allergies (reviewed today): * NIASPAN * OXYCODONE     Review of Systems      See HPI   Physical Exam  General:     alert, well-developed, and well-nourished.  Tears frequently with discussion of finances. Ears:     TMs retracted with fluid bilat Nose:     eryth, mild edema, sinuses neg. Mouth:     injected with no exudate, do not see fx of tooth that hurts. Lungs:     clear, normal respiratory effort, no crackles, and no wheezes.   Neurologic:     alert & oriented X3, sensation intact to light touch, and gait normal.   Skin:     other than the ecchymosis at pins in toes, skin clear. Cervical Nodes:     no anterior cervical adenopathy and no posterior cervical adenopathy.   Psych:     normally interactive, flat affect, subdued, tearful, and slightly anxious.      Impression & Recommendations:  Problem # 1:  SEROUS OTITIS (ICD-381.4) Assessment: New is out of Flonase due to finances is able to get Zyrtec through pharmacutical help program encouraged to increase by mouth fluids will keep dental appt see back if not improved with use of Veramyst Nasal Spray 2samples give   Problem # 2:  DEPRESSION  (ICD-311) Assessment: Deteriorated indicates she is holding together OK for now--no change in meds.---is taking Her updated medication list for this problem includes:    Wellbutrin Xl 300 Mg Tb24 (Bupropion hcl) .Marland Kitchen... Take 1 tablet by mouth once a day   Complete Medication List: 1)  Wellbutrin Xl 300 Mg Tb24 (Bupropion hcl) .... Take 1 tablet by mouth once a day 2)  Nexium 40 Mg Cpdr (Esomeprazole magnesium) .... Take 1 capsule by mouth once a day 3)  Flonase 50 Mcg/act Susp (Fluticasone propionate) .... Use as directed 4)  Armour Thyroid 90 Mg  .... One by mouth qd 5)  Zyrtec Allergy 10 Mg Tabs (Cetirizine hcl) .... Take 1 tablet by mouth once a day as needed 6)  Fish Oil 1000 Mg Caps (Omega-3 fatty acids) .... 3 every day 7)  Veramyst 27.5 Mcg/spray Susp (Fluticasone furoate) .... 2 sprays each nostril qd     ]

## 2010-06-30 NOTE — Letter (Signed)
Summary: Results Follow up Letter  [facility]  [address] [place], [place] [address]   Phone: [phone]  Fax: [phone]    04/23/2009 MRN: [id number]    Tiffany Orozco [address] [place], [place]  [address]    Dear Ms. SANTILLO,  The following are the results of your recent test(s):  Test         Result    Pap Smear:        Normal _____  Not Normal _____ Comments: ______________________________________________________ Cholesterol: LDL(Bad cholesterol):         Your goal is less than:         HDL (Good cholesterol):       Your goal is more than: Comments:  ______________________________________________________ Mammogram:        Normal _____  Not Normal _____ Comments:  ___________________________________________________________________ Hemoccult:        Normal _____  Not normal _______ Comments:    _____________________________________________________________________ Other Tests:  Bone Density Test:  Shows osteopenia (the beginnings of osteoporosis).   Risk factors however need to be addressed, ie smoking.  Be sure to take  Calcium + D and extra supplememtal Vitamin D.   We will repeat in 2 years.  We routinely do not discuss normal results over the telephone.  If you desire a copy of the results, or you have any questions about this information we can discuss them at your next office visit.   Sincerely,    Billie-Lynn Dena Billet, FNP  BDB:lsf

## 2010-06-30 NOTE — Progress Notes (Signed)
Summary: refill request for diclofenac  Phone Note Refill Request Message from:  Fax from Pharmacy  Refills Requested: Medication #1:  diclofenac 75 mg Faxed request from [facility] drive, this is no longer on med list.  Initial call taken by: Lowella Petties CMA, AAMA,  April 17, 2010 12:40 PM  Follow-up for Phone Call        ok, #60, 1 by mouth two times a day, 2 refills Follow-up by: Hannah Beat MD,  April 17, 2010 12:50 PM    New/Updated Medications: DICLOFENAC SODIUM 75 MG TBEC (DICLOFENAC SODIUM) take one by mouth two times a day Prescriptions: DICLOFENAC SODIUM 75 MG TBEC (DICLOFENAC SODIUM) take one by mouth two times a day  #60 x 2   Entered by:   Lowella Petties CMA, AAMA   Authorized by:   Hannah Beat MD   Signed by:   Lowella Petties CMA, AAMA on 04/17/2010   Method used:   [facility]        [id number]       [address]       [place], [place]  [address]       Ph: [phone]       Fax: [phone]   RxID:   [id number]   Prior Medications: NEXIUM 40 MG CPDR (ESOMEPRAZOLE MAGNESIUM) Take 1 capsule by mouth once a day FEXOFENADINE HCL 180 MG TABS (FEXOFENADINE HCL) 1 tab by mouth daily FISH OIL 1000 MG  CAPS (OMEGA-3 FATTY ACIDS) 3 every day - on hold for now [facility]: Lambert Rhonda W XL 150 MG  TB24 (BUPROPION HCL) take 3 every am by mouth for depression FLONASE 50 MCG/ACT SUSP (FLUTICASONE PROPIONATE) 2 sprays to each nostril daily SYNTHROID 112 MCG TABS (LEVOTHYROXINE SODIUM) 1 tab by mouth daily BC FAST PAIN RELIEF 845-65 MG PACK (ASPIRIN-CAFFEINE) 1 by mouth as needed VITAMIN D 1000MG  () Take two tablets once daily PROAIR HFA 108 (90 BASE) MCG/ACT AERS (ALBUTEROL SULFATE) 2 puffs every 4 hrs as needed chest tightness or wheezing FLEXERIL 10 MG TABS (CYCLOBENZAPRINE HCL) take one by mouth three times a day as needed muscle spasm, sedation precautions Current Allergies: * NIASPAN * OXYCODONE
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
## 2010-06-30 NOTE — Progress Notes (Signed)
Summary: [facility]  Phone Note Refill Request Message from:  Fax from Pharmacy on March 27, 2009 2:54 PM  Refills Requested: Medication #1:  ALBUTEROL 90 MCG/ACT  AERS as needed [place]  Phone:   [phone]   Method Requested: Electronic Initial call taken by: Delilah Shan CMA Duncan Dull),  March 27, 2009 2:54 PM  Follow-up for Phone Call        new Rx completed  Billie-Lynn Tyler Deis FNP  March 27, 2009 5:13 PM     New/Updated Medications: PROAIR HFA 108 (90 BASE) MCG/ACT AERS (ALBUTEROL SULFATE) 2 puffs every 4 hrs as needed chest tightness or wheezing Prescriptions: PROAIR HFA 108 (90 BASE) MCG/ACT AERS (ALBUTEROL SULFATE) 2 puffs every 4 hrs as needed chest tightness or wheezing  #1 x 1   Entered and Authorized by:   Gildardo Griffes FNP   Signed by:   Gildardo Griffes FNP on 03/27/2009   Method used:   Electronically to        [facility]* (retail)       [address]       [place], [place]  [address]       Ph: [phone]       Fax: [phone]   RxID:   [id number]

## 2010-06-30 NOTE — Assessment & Plan Note (Signed)
Summary: NP6/AMD   Visit Type:  New Patient Referring Provider:  Gildardo Griffes, FNP Primary Provider:  Mcarthur Rossetti Bean FNP  CC:  chest tightness at times; sob most of the time and with exertion; sometimes feels like her pulse will start racing if she is sitting; no edema; having surgery on foot and has lots of foot pain and when in pain her bp goes up; she states that sometimes she has a pressure or feeling in her chest...  History of Present Illness: 60 y/o woman h/o COPD and ongoing tobacco abuse, HTN, hyperlipidemia, depression, treated hypothryoidism, GERD referred for further evaluation of dyspnea and palpitations.   Denies any h/o known heart disease. Has never had a cardiac work-up.   Started noticing palpitations about 1 year ago. Very mild. Under a lot of stress at the time. Over past few months she has noted she has gotten much more SOB with activity. Recently had hammer to surgery and now having problems with her ankle and planning fo rmore surgery so having a hard time walking. Now SOB with only mild to moderate activity. Very winded with 1 flight of steps. Occasional chest tightness worse with exertion. Can also occur at rest. Usually lasts a few minutes then resolves. No prolonged episodes. Continues with palpiations but not prolonged. No syncope or presyncope. No recent bleeding or neuro symptoms.   Recent cholesterol check: TC 200, TG 137 HDL 29 LDL 143  Preventive Screening-Counseling & Management  Alcohol-Tobacco     Alcohol drinks/day: 0     Smoking Status: current     Packs/Day: 5 cig a day  Caffeine-Diet-Exercise     Caffeine use/day: 4 x's day  Current Medications (verified): 1)  Nexium 40 Mg Cpdr (Esomeprazole Magnesium) .... Take 1 Capsule By Mouth Once A Day 2)  Zyrtec Allergy 10 Mg  Tabs (Cetirizine Hcl) .... Take 1 Tablet By Mouth Once A Day 3)  Fish Oil 1000 Mg  Caps (Omega-3 Fatty Acids) .... 3 Every Day - On Hold For Now 4)  Wellbutrin  Xl 150 Mg  Tb24 (Bupropion Hcl) .... Take 3 Every Am By Mouth For Depression 5)  Albuterol 90 Mcg/act  Aers (Albuterol) .... As Needed 6)  Diclofenac Sodium 75 Mg  Tbec (Diclofenac Sodium) .... Take 1 Tablet By Mouth Every 12 Hours As Needed 7)  Flonase 50 Mcg/act Susp (Fluticasone Propionate) .... 2 Sprays To Each Nostril Daily 8)  Synthroid 100 Mcg Tabs (Levothyroxine Sodium) .Marland Kitchen.. 1 Daily For Thyroid 9)  Aleve Cold & Sinus 120-220 Mg Xr12h-Tab (Pseudoephedrine-Naproxen Na) .Marland Kitchen.. 1 By Mouth As Needed 10)  Bc Fast Pain Relief 845-65 Mg Pack (Aspirin-Caffeine) .Marland Kitchen.. 1 By Mouth As Needed  Allergies (verified): 1)  * Niaspan 2)  * Oxycodone  Past History:  Past Medical History: Anxiety Depression GERD Hypothyroidism Skin cancers on face and arm Allergic rhinitis COPD Hyperlipidemia Hypertension Obesity  Family History: Father: deceased 81; skin cancer Mother: deceased 83; ? cause of death; HTN Siblings:living and healthy  Social History: Packs/Day:  5 cig a day Caffeine use/day:  4 x's day  Review of Systems       + allergies, fatigue, arthitis, headaches. otherwise all systems negative except as listed in HPI and problem list.  Vital Signs:  Patient profile:   60 year old female Height:      60 inches Weight:      177.25 pounds BMI:     34.74 Pulse rate:   60 / minute Pulse rhythm:  regular BP sitting:   154 / 90  (left arm) Cuff size:   large  Vitals Entered By: Mercer Pod (March 20, 2009 11:15 AM) CC: chest tightness at times; sob most of the time and with exertion; sometimes feels like her pulse will start racing if she is sitting; no edema; having surgery on foot and has lots of foot pain and when in pain her bp goes up; she states that sometimes she has a pressure or feeling in her chest..   Physical Exam  General:  Gen: well appearing. no resp difficulty HEENT: normal Neck: supple. no JVD. Carotids 2+ bilat; no bruits. No lymphadenopathy or  thryomegaly appreciated. Cor: PMI nondisplaced. Regular rate & rhythm. No rubs, gallops, 2/6 sys murmur at RSB S2 ok. Lungs: clear. decreased breath sounds Abdomen: soft, nontender, nondistended. No hepatosplenomegaly. No bruits or masses. Good bowel sounds. Extremities: no cyanosis, clubbing, rash, edema Neuro: alert & orientedx3, cranial nerves grossly intact. moves all 4 extremities w/o difficulty. affect pleasant    Impression & Recommendations:  Problem # 1:  CHEST TIGHTNESS-PRESSURE-OTHER ([id number]) Typical and atypical features with multile CRFs. We discussed cath vs stress test will proceed adenosine/myoview (hard time walking due to foot and ankle)  Problem # 2:  PALPITATIONS (ICD-785.1) Mild. Need to decrease caffeine. If persists will place monitor. Need to conside OSA as trigger.  Problem # 3:  MURMUR (ICD-785.2) Probable aortosclerosis. Check echo to confirm.   Orders: Echocardiogram (Echo)  Problem # 4:  DYSPNEA (ICD-786.05) Multifactorial. Probable COPD and obesity. R/o ischemic heart disease. Check myoview and echo.  Counseled on need to quick smoking.  Orders: Nuclear Stress Test (Nuc Stress Test)  Problem # 5:  TOBACCO ABUSE (ICD-305.1)  Counseled on need to quit smoking.  Patient Instructions: 1)  Your physician recommends that you schedule a follow-up appointment in: 2 months 2)  Your physician recommends that you continue on your current medications as directed. Please refer to the Current Medication list given to you today. 3)  Your physician has requested that you have an echocardiogram.  Echocardiography is a painless test that uses sound waves to create images of your heart. It provides your doctor with information about the size and shape of your heart and how well your heart's chambers and valves are working.  This procedure takes approximately one hour. There are no restrictions for this procedure. 4)  Your physician has requested that you have an  adenosine myoview.  For further information please visit [email or web address]  Please follow instruction sheet, as given.

## 2010-06-30 NOTE — Letter (Signed)
Summary: Pre Visit Letter Revised  [facility]  [address] [place], [place] [address]   Phone: [phone]  Fax: [phone]        03/19/2010 MRN: [id number]  Tiffany Orozco [address] [place], [place]  [address]             Procedure Date: 12-6 at 10:30am   Welcome to the Gastroenterology Division at [facility].    You are scheduled to see a nurse for your pre-procedure visit on 04-20-10 at 1pm on the 3rd floor at [facility], [address]  We ask that you try to arrive at our office 15 minutes prior to your appointment time to allow for check-in.  Please take a minute to review the attached form.  If you answer "Yes" to one or more of the questions on the first page, we ask that you call the person listed at your earliest opportunity.  If you answer "No" to all of the questions, please complete the rest of the form and bring it to your appointment.    Your nurse visit will consist of discussing your medical and surgical history, your immediate family medical history, and your medications.   If you are unable to list all of your medications on the form, please bring the medication bottles to your appointment and we will list them.  We will need to be aware of both prescribed and over the counter drugs.  We will need to know exact dosage information as well.    Please be prepared to read and sign documents such as consent forms, a financial agreement, and acknowledgement forms.  If necessary, and with your consent, a friend or relative is welcome to sit-in on the nurse visit with you.  Please bring your insurance card so that we may make a copy of it.  If your insurance requires a referral to see a specialist, please bring your referral form from your primary care physician.  No co-pay is required for this nurse visit.     If you cannot keep your appointment, please call [phone] to cancel or reschedule prior to your appointment date.  This  allows [place] the opportunity to schedule an appointment for another patient in need of care.    Thank you for choosing [facility] for your medical needs.  We appreciate the opportunity to care for you.  Please visit [place] at our website  to learn more about our practice.  Sincerely, The Gastroenterology Division
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
## 2010-06-30 NOTE — Assessment & Plan Note (Signed)
Summary: still has cough   Vital Signs:  Patient Profile:   60 Years Old Female Height:     60 inches Weight:      182 pounds Temp:     98.2 degrees F oral Pulse rate:   76 / minute Pulse rhythm:   regular BP sitting:   160 / 82  (left arm) Cuff size:   regular  Vitals Entered By: Lowella Petties (March 20, 2008 10:00 AM)                 Chief Complaint:  Cough is no better.  Also check right ankle.Tiffany Orozco  History of Present Illness: Here for cough, not better since seen 03/11/08--was started on prednisone taper for 7days and z pac. Having chills, no fever, cough  non-productive, no nasal congestion --continuing to smoke --using albuterol 1 time once daily--thought could not use more   also here for R ankle bruise--under care of Podiatrist, needs more surgery on r foot.    Current Allergies: * NIASPAN * OXYCODONE  Past Medical History:    Reviewed history from 08/14/2007 and no changes required:       Anxiety       Depression       GERD       Hypothyroidism       Skin cancers on face and arm       Allergic rhinitis       COPD     Review of Systems  CV      Complains of shortness of breath with exertion.      Denies chest pain or discomfort.  Resp      Complains of cough, shortness of breath, and wheezing.  GI      Denies constipation, diarrhea, nausea, and vomiting.  MS      Denies muscle aches.   Physical Exam  General:     alert, well-developed, well-nourished, and well-hydrated.  NAD Ears:     R ear normal and L ear normal.   Nose:     no mucosal edema and no airflow obstruction.  sinuses + thropughout Mouth:     no exudates and pharyngeal erythema.   Lungs:     moist harsh cough with scattered wheezes that move wioth cough no crackles.   Cervical Nodes:     no anterior cervical adenopathy and no posterior cervical adenopathy.   Psych:     normally interactive.      Impression & Recommendations:  Problem # 1:  BRONCHITIS-ACUTE  (ICD-466.0) Assessment: Unchanged not improved with 9d of zithromax will move to Avelox 1 once daily x 10d continue comfort care measures: increase po fluids, rest, tylenol or IBP as needed see back 5 d if not improved Her updated medication list for this problem includes:    Albuterol 90 Mcg/act Aers (Albuterol) .Tiffany Orozco... As needed    Azithromycin 250 Mg Tabs (Azithromycin) .Tiffany Orozco... 2 tab by mouth x 1 day, then 1 tab by mouth daily    Avelox 400 Mg Tabs (Moxifloxacin hcl) .Tiffany Orozco... 1 once daily for bronchitis by mouth   Problem # 2:  WHEEZING (ICD-786.07) Assessment: Unchanged encouraged to usse Albuterol HFA which she has--reviewed dosage and frequency  Problem # 3:  CHRONIC OBSTRUCTIVE PULMONARY DISEASE, ACUTE EXACERBATION (ICD-491.21) Assessment: Comment Only strongly encouraged reduction of cigarettes with goalof D/C  Complete Medication List: 1)  Nexium 40 Mg Cpdr (Esomeprazole magnesium) .... Take 1 capsule by mouth once a day 2)  Flonase 50 Mcg/act  Susp (Fluticasone propionate) .... Use as directed 3)  Armour Thyroid 90 Mg  .... One by mouth once daily 4)  Zyrtec Allergy 10 Mg Tabs (Cetirizine hcl) .... Take 1 tablet by mouth once a day as needed 5)  Fish Oil 1000 Mg Caps (Omega-3 fatty acids) .... 3 every day 6)  Wellbutrin Xl 150 Mg Tb24 (Bupropion hcl) .... Take 3 qam by mouth for depression 7)  Albuterol 90 Mcg/act Aers (Albuterol) .... As needed 8)  Diclofenac Sodium 75 Mg Tbec (Diclofenac sodium) .... Take 1 tablet by mouth every 12 hours 9)  Synthroid 88 Mcg Tabs (Levothyroxine sodium) .Tiffany Orozco.. 1 once daily for thyroid 10)  Azithromycin 250 Mg Tabs (Azithromycin) .... 2 tab by mouth x 1 day, then 1 tab by mouth daily 11)  Prednisone 20 Mg Tabs (Prednisone) .... 3 tabs by mouth daily x 3 days, then 2 tabs by mouth daily x 2 days then 1 tab by mouth daily x 2 days 12)  Avelox 400 Mg Tabs (Moxifloxacin hcl) .Tiffany Orozco.. 1 once daily for bronchitis by mouth    Prescriptions: AVELOX 400 MG  TABS (MOXIFLOXACIN HCL) 1 once daily for bronchitis by mouth Brand medically necessary #10 x 0   Entered and Authorized by:   Gildardo Griffes FNP   Signed by:   Gildardo Griffes FNP on 03/20/2008   Method used:   Print then Give to Patient   RxID:   [id number]  ] Prior Medications (reviewed today): NEXIUM 40 MG CPDR (ESOMEPRAZOLE MAGNESIUM) Take 1 capsule by mouth once a day FLONASE 50 MCG/ACT  SUSP (FLUTICASONE PROPIONATE) use as directed ARMOUR THYROID 90 MG () one by mouth once daily ZYRTEC ALLERGY 10 MG  TABS (CETIRIZINE HCL) Take 1 tablet by mouth once a day as needed FISH OIL 1000 MG  CAPS (OMEGA-3 FATTY ACIDS) 3 every day WELLBUTRIN XL 150 MG  TB24 (BUPROPION HCL) take 3 qam by mouth for depression ALBUTEROL 90 MCG/ACT  AERS (ALBUTEROL) as needed DICLOFENAC SODIUM 75 MG  TBEC (DICLOFENAC SODIUM) Take 1 tablet by mouth every 12 hours SYNTHROID 88 MCG  TABS (LEVOTHYROXINE SODIUM) 1 once daily for thyroid AZITHROMYCIN 250 MG TABS (AZITHROMYCIN) 2 tab by mouth x 1 day, then 1 tab by mouth daily PREDNISONE 20 MG TABS (PREDNISONE) 3 tabs by mouth daily x 3 days, then 2 tabs by mouth daily x 2 days then 1 tab by mouth daily x 2 days Current Allergies: * NIASPAN * OXYCODONE

## 2010-06-30 NOTE — Assessment & Plan Note (Signed)
Summary: ST,CONGESTION/CLE   Vital Signs:  Patient Profile:   60 Years Old Female Height:     60 inches Weight:      174 pounds Temp:     98.3 degrees F oral Pulse rate:   81 / minute BP sitting:   139 / 84  (left arm) Cuff size:   regular  Vitals Entered By: Cooper Render (June 30, 2007 8:45 AM)                 Chief Complaint:  URI sx and ears hurt.  History of Present Illness: Here due to onset of ears full, mucus production with cough, sometimes looses voice, low grade fever/chills, chest is tight x 3d.  No wheezing.  Not sleeping due to stress.  Continues to smoke 1/2pk/d. --not able to afford,lso is stopping. using Veramyst nasal spray, not taking Zyrtec--can't afford.  Filing chapter 7.  Continues with pain in R foot--to see Podiartist again--pins out now.  Current Allergies (reviewed today): * NIASPAN * OXYCODONE Updated/Current Medications (including changes made in today's visit):  WELLBUTRIN XL 300 MG TB24 (BUPROPION HCL) Take 1 tablet by mouth once a day NEXIUM 40 MG CPDR (ESOMEPRAZOLE MAGNESIUM) Take 1 capsule by mouth once a day FLONASE 50 MCG/ACT  SUSP (FLUTICASONE PROPIONATE) use as directed * ARMOUR THYROID 90 MG one by mouth qd ZYRTEC ALLERGY 10 MG  TABS (CETIRIZINE HCL) Take 1 tablet by mouth once a day as needed FISH OIL 1000 MG  CAPS (OMEGA-3 FATTY ACIDS) 3 every day AVELOX 400 MG  TABS (MOXIFLOXACIN HCL) 1 once daily--antibiodic   Past Medical History:    Reviewed history from 02/27/2007 and no changes required:       Anxiety       Depression       GERD       Hypothyroidism       Skin cancers on face and arm      Physical Exam  General:     alert, well-developed, well-nourished, and well-hydrated.  upset, smells of tobacco smoke Ears:     Tms retracted with fluid Nose:     eryth, no edema, sinuses +,- Mouth:     injected with no exudate Lungs:     moist cough, normal respiratory effort, no crackles, and no wheezes.    Neurologic:     alert & oriented X3 and gait normal.   Skin:     turgor normal, color normal, and no rashes.   Psych:     normally interactive, depressed affect, flat affect, subdued, and tearful.      Impression & Recommendations:  Problem # 1:  URI (ICD-465.9) Assessment: New continue comfort care measures: increase po fluids, rest, tylenol or IBP as needed continue Veramyst Nasal spray2 once daily--sample given will give Rx for Avelox x10 d to hold and fill only of gets worse--call in if fills Her updated medication list for this problem includes:    Zyrtec Allergy 10 Mg Tabs (Cetirizine hcl) .Marland Kitchen... Take 1 tablet by mouth once a day as needed   Problem # 2:  DEPRESSION (ICD-311) Assessment: Unchanged denies suicide or homocidal ideation refuses med at this time discusssed counseling or Mental Health referral, refused--willl let me know. Her updated medication list for this problem includes:    Wellbutrin Xl 300 Mg Tb24 (Bupropion hcl) .Marland Kitchen... Take 1 tablet by mouth once a day   Complete Medication List: 1)  Wellbutrin Xl 300 Mg Tb24 (Bupropion hcl) .... Take 1 tablet  by mouth once a day 2)  Nexium 40 Mg Cpdr (Esomeprazole magnesium) .... Take 1 capsule by mouth once a day 3)  Flonase 50 Mcg/act Susp (Fluticasone propionate) .... Use as directed 4)  Armour Thyroid 90 Mg  .... One by mouth qd 5)  Zyrtec Allergy 10 Mg Tabs (Cetirizine hcl) .... Take 1 tablet by mouth once a day as needed 6)  Fish Oil 1000 Mg Caps (Omega-3 fatty acids) .... 3 every day 7)  Avelox 400 Mg Tabs (Moxifloxacin hcl) .Marland Kitchen.. 1 once daily--antibiodic     Prescriptions: AVELOX 400 MG  TABS (MOXIFLOXACIN HCL) 1 once daily--antibiodic  #10 x 0   Entered and Authorized by:   Gildardo Griffes FNP   Signed by:   Gildardo Griffes FNP on 06/30/2007   Method used:   Print then Give to Patient   RxID:   [id number]  ]

## 2010-06-30 NOTE — Assessment & Plan Note (Signed)
Summary: PAP SMEAR AND CPX/CLE   Vital Signs:  Patient profile:   60 year old female Height:      60 inches Weight:      173.75 pounds BMI:     34.06 Temp:     98.5 degrees F oral Pulse rate:   80 / minute Pulse rhythm:   regular BP sitting:   142 / 80  (left arm) Cuff size:   regular  Vitals Entered By: Lewanda Rife LPN (March 06, 2009 9:13 AM)  CC:  comoplete physical with pap smear and breast exam LMP 10 yrs ago.  History of Present Illness: Here for CPX--pap and breast exam --last pap--01/02/03--neg --last mammo --04/03/08--ned --last tetnus--10/98--overdue --colonoscopy--reports one yrs ago by Dr Mervyn Gay not remembered --DEXA--not sure if has had one  --being seen at [facility] for feet  --had infection in skin of   Preventive Screening-Counseling & Management  Alcohol-Tobacco     Alcohol drinks/day: 0     Smoking Status: current     Packs/Day: 0.25  Caffeine-Diet-Exercise     Caffeine use/day: 6     Does Patient Exercise: no  Problems Prior to Update: 1)  Postmenopausal Status  (ICD-V49.81) 2)  Unspecified Cardiac Dysrhythmia  (ICD-427.9) 3)  Sprain and Strain of Sacral Area  (ICD-846.0) 4)  Headache  (ICD-784.0) 5)  Wheezing  (ICD-786.07) 6)  Chronic Obstructive Pulmonary Disease, Acute Exacerbation  (ICD-491.21) 7)  Foot Pain  (ICD-729.5) 8)  Sciatica, Left  (ICD-724.3) 9)  Other&unspecified Diseases The Oral Soft Tissues  (ICD-528.9) 10)  COPD  (ICD-496) 11)  Allergic Rhinitis  (ICD-477.9) 12)  Depression  (ICD-311) 13)  Uri  (ICD-465.9) 14)  Serous Otitis  (ICD-381.4) 15)  Well Adult  (ICD-V70.0) 16)  Preoperative Examination  (ICD-V72.84) 17)  Hx of Tobacco Abuse  (ICD-305.1) 18)  Hx of Hypercholesterolemia  (ICD-272.0) 19)  Hypothyroidism  (ICD-244.9) 20)  Gerd  (ICD-530.81) 21)  Depression  (ICD-311) 22)  Anxiety  (ICD-300.00)  Medications Prior to Update: 1)  Nexium 40 Mg Cpdr (Esomeprazole Magnesium) .... Take 1 Capsule By Mouth  Once A Day 2)  Zyrtec Allergy 10 Mg  Tabs (Cetirizine Hcl) .... Take 1 Tablet By Mouth Once A Day As Needed 3)  Fish Oil 1000 Mg  Caps (Omega-3 Fatty Acids) .... 3 Every Day 4)  Wellbutrin Xl 150 Mg  Tb24 (Bupropion Hcl) .... Take 3 Every Am By Mouth For Depression 5)  Albuterol 90 Mcg/act  Aers (Albuterol) .... As Needed 6)  Diclofenac Sodium 75 Mg  Tbec (Diclofenac Sodium) .... Take 1 Tablet By Mouth Every 12 Hours 7)  Flonase 50 Mcg/act Susp (Fluticasone Propionate) .... 2 Sprays To Each Nostril Daily 8)  Synthroid 100 Mcg Tabs (Levothyroxine Sodium) .Marland Kitchen.. 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 CV:  Complains of palpitations; denies chest pain or discomfort, swelling of feet, and swelling of hands; racing at times --no more caffeine than ususalppcan be at resto or with activity--tired afterwards. Resp:  Complains of shortness of breath; denies cough and wheezing; uses HFA at least 1 x once daily . GI:  Complains of indigestion; denies change in bowel habits, constipation, diarrhea, nausea, and  vomiting; Nexium workd--no break through, signs if does not take each morning--no EGD, has seen Dr Doreatha Martin. GU:  Complains of incontinence and nocturia; denies abnormal vaginal bleeding, discharge, and genital sores; does not wear a pad nocturia x 1. MS:  See HPI. Derm:  Complains of lesion(s); denies rash; place on back. Neuro:  Complains of disturbances in coordination and memory loss; denies difficulty with concentration, falling down, seizures, tremors, and weakness; stumbling--problems with feet hard to remember. Psych:  Complains of anxiety and depression; taking 3 wellbutrin 150   once daily--works nost of the time--has never done SSRI.  Physical Exam  General:  alert, well-developed, well-nourished, and well-hydrated.   Head:  normocephalic, atraumatic, and no abnormalities observed.   Eyes:  vision grossly intact, pupils equal, pupils round, and no injection.   Neck:  no thyromegaly, no JVD, and no carotid bruits.   Breasts:  skin/areolae normal, no masses, and no abnormal thickening.   Lungs:  normal respiratory effort, no intercostal retractions, no accessory muscle use, and normal breath sounds.   Heart:  normal rate, regular rhythm, and no murmur.   Abdomen:  soft, non-tender, normal bowel sounds, no distention, no masses, no guarding, no abdominal hernia, no inguinal hernia, no hepatomegaly, and no splenomegaly.   Rectal:  normal sphincter tone, no masses, and external hemorrhoid(s).  guiac neg Genitalia:  normal introitus, no external lesions, no vaginal discharge, mucosa pink and moist, no vaginal or cervical lesions, no friaility or hemorrhage, normal uterus size and position, and no adnexal masses or tenderness.   Extremities:  no edema either lower legs Neurologic:  alert & oriented X3, sensation intact to light touch, and gait normal.   Skin:  turgor normal, color normal, and no rashes.   Axillary Nodes:  no R axillary adenopathy and no L axillary adenopathy.   Inguinal Nodes:  no R inguinal adenopathy and no L inguinal adenopathy.   Psych:  normally interactive and good eye contact.     Impression & Recommendations:  Problem # 1:  WELL ADULT (ICD-V70.0) well 61 yr old woman with several medical problems pap--notify will schedule DEXA she will schedule her own mammo will update her immunizations and give Tdap today will get baseline labs as below, tx if needed Orders: Venipuncture ([address]) TLB-Lipid Panel (80061-LIPID) TLB-BMP (Basic Metabolic Panel-BMET) (80048-METABOL) TLB-CBC Platelet - w/Differential (85025-CBCD) TLB-Hepatic/Liver Function  Pnl (80076-HEPATIC)  Problem # 2:  UNSPECIFIED CARDIAC DYSRHYTHMIA (ICD-427.9) Assessment: New EKG--NSR  will refer to cardio for eval Orders: 12 Lead EKG (12 Lead EKG) Cardiology Referral (Cardiology)  Problem # 3:  HYPOTHYROIDISM (ICD-244.9) Assessment: Unchanged stable on current dose of Synthroid--has appt for repeat lab Her updated medication list for this problem includes:    Synthroid 100 Mcg Tabs (Levothyroxine sodium) .Marland Kitchen... 1 daily for thyroid  Labs Reviewed: TSH: 1.24 (01/16/2009)    Chol: 168 (03/06/2007)   HDL: 33.2 (03/06/2007)   LDL: 114 (03/06/2007)   TG: 103 (03/06/2007)  Problem # 4:  TOBACCO ABUSE (ICD-305.1) Assessment: Unchanged strongly encouraged to reduce amount of use with goal of stopping  Complete Medication List: 1)  Nexium 40 Mg Cpdr (Esomeprazole magnesium) .... Take 1 capsule by mouth once a day 2)  Zyrtec Allergy 10 Mg Tabs (Cetirizine hcl) .... Take 1 tablet by mouth once a day 3)  Fish Oil 1000 Mg Caps (Omega-3 fatty acids) .... 3 every day 4)  Wellbutrin Xl 150 Mg Tb24 (Bupropion hcl) .... Take 3 every am by mouth for depression 5)  Albuterol 90 Mcg/act Aers (Albuterol) .... As needed 6)  Diclofenac Sodium 75 Mg Tbec (Diclofenac sodium) .... Take 1 tablet by mouth every 12 hours as needed 7)  Flonase 50 Mcg/act Susp (Fluticasone propionate) .... 2 sprays to each nostril daily 8)  Synthroid 100 Mcg Tabs (Levothyroxine sodium) .Marland Kitchen.. 1 daily for thyroid  Other Orders: Flu Vaccine 76yrs + (69485) Admin 1st Vaccine (46270) T-Vitamin D (25-Hydroxy) [phone]) Tdap => 63yrs IM (99371) Admin of Any Addtl Vaccine (69678) Specimen Handling (93810) Radiology Referral (Radiology)  Patient Instructions: 1)  schedule for DEXA 2)  see ba ck in 2-3 wks for memory testing--15min appt  Current Allergies (reviewed today): * NIASPAN * OXYCODONE   Immunizations Administered:  Influenza Vaccine # 1:    Vaccine Type: Fluvax 3+    Site: left deltoid     Mfr: GlaxoSmithKline    Dose: 0.5 ml    Route: IM    Given by: Lewanda Rife LPN    Exp. Date: 11/27/2009    Lot #: [id number]    VIS given: 12/22/06 version given March 06, 2009.  Tetanus Vaccine:    Vaccine Type: Tdap    Site: right deltoid    Mfr: GlaxoSmithKline    Dose: 0.5 ml    Route: IM    Given by: Lewanda Rife LPN    Exp. Date: 07/11/2010    Lot #: [id number]    VIS given: 04/18/07 version given March 06, 2009.  Flu Vaccine Consent Questions:    Do you have a history of severe allergic reactions to this vaccine? no    Any prior history of allergic reactions to egg and/or gelatin? no    Do you have a sensitivity to the preservative Thimersol? no    Do you have a past history of Guillan-Barre Syndrome? no    Do you currently have an acute febrile illness? no    Have you ever had a severe reaction to latex? no    Vaccine information given and explained to patient? yes    Are you currently pregnant? no
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
## 2010-06-30 NOTE — Progress Notes (Signed)
Summary: refill request for bupropion  Phone Note Refill Request Message from:  Fax from Pharmacy  Refills Requested: Medication #1:  WELLBUTRIN XL 150 MG  TB24 take 3 qam by mouth for depression   Last Refilled: 07/17/2008 Faxed request from [facility], they said this is the correct dose.  Initial call taken by: Lowella Petties,  August 21, 2008 4:21 PM  Follow-up for Phone Call        Rx attatched  Gildardo Griffes FNP  August 21, 2008 4:57 PM       Prescriptions: WELLBUTRIN XL 150 MG  TB24 (BUPROPION HCL) take 3 qam by mouth for depression  #90 x 6   Entered and Authorized by:   Gildardo Griffes FNP   Signed by:   Gildardo Griffes FNP on 08/21/2008   Method used:   Electronically to        [facility]* (retail)       [address]       [place], [place]  [address]       Ph: [phone]       Fax: [phone]   RxID:   [id number]

## 2010-06-30 NOTE — Letter (Signed)
Summary: Disability Determination Services -No contraindications for Pulm  Disability Determination Services -No contraindications for Pulmonary Function Study   Imported By: Beau Fanny 06/07/2008 10:40:16  _____________________________________________________________________  External Attachment:    Type:   Image     Comment:   External Document

## 2010-06-30 NOTE — Letter (Signed)
Summary: Results Follow up Letter  [facility]  [address] [place], [place] [address]   Phone: [phone]  Fax: [phone]    03/11/2009 MRN: [id number]    Tiffany Orozco [address] [place], [place]  [address]    Dear [name],  The following are the results of your recent test(s):  Test         Result    Pap Smear:        Normal __X___  Not Normal _____ Comments:Please repeat in one year. ______________________________________________________ Cholesterol: LDL(Bad cholesterol):         Your goal is less than:         HDL (Good cholesterol):       Your goal is more than: Comments:  ______________________________________________________ Mammogram:        Normal _____  Not Normal _____ Comments:  ___________________________________________________________________ Hemoccult:        Normal _____  Not normal _______ Comments:    _____________________________________________________________________ Other Tests:    We routinely do not discuss normal results over the telephone.  If you desire a copy of the results, or you have any questions about this information we can discuss them at your next office visit.   Sincerely,    Joycelyn Man

## 2010-06-30 NOTE — Assessment & Plan Note (Signed)
Summary: headaches,fluid in ear,check foot/bir   Vital Signs:  Patient profile:   60 year old female Weight:      179 pounds BMI:     35.08 Temp:     98.6 degrees F oral Pulse rate:   60 / minute Pulse rhythm:   regular BP sitting:   140 / 88  (right arm) Cuff size:   large  Vitals Entered By: Sydell Axon (Oct 01, 2008 11:20 AM)  CC:  Headaches, fluid in ears, and check right food.  History of Present Illness: Here for several problems:  --headaches--takes Aleve or BC's  --L ear--worse since this allergy season, off and on---taking flonase and generic zyrtec cant afford Albuterol  --R foot--Podiatry referred her to Ortho at [facility] her not to have more surgery on foot.  --thyroid, sweating more--thinks she needs more  Allergies: 1)  * Niaspan 2)  * Oxycodone  Past History:  Past Medical History:    Reviewed history from 08/14/2007 and no changes required:    Anxiety    Depression    GERD    Hypothyroidism    Skin cancers on face and arm    Allergic rhinitis    COPD  Social History:    Marital Status: Married    Children:     Occupation: bookkeeper    Current Smoker, 1/2 pack a day    09/2008--husband has had open heart surgery, not working  Physical Exam  General:  alert, well-developed, well-nourished, well-hydrated, and overweight-appearing.  NAD Ears:  TMs retracted with fluid bilat Nose:  boggy but little edema, no airflow obstruction.  sinuses +,- Mouth:  no exudates and pharyngeal erythema.   Neck:  no carotid bruits.   Lungs:  normal respiratory effort, no intercostal retractions, no accessory muscle use, normal breath sounds, no crackles, and no wheezes.  moist cough only Heart:  normal rate, regular rhythm, no murmur, and no gallop.   Msk:  incisions on R foot over most MCP joints, second toe overlapped by third and flat--little to no movement no edema in R foot, no discoloration, tender to palp mid foot to ends of all toes --wearing no  support sandals Extremities:  no edema either lower leg Neurologic:  alert & oriented X3 and sensation intact to light touch.  limps slightly  Skin:  see M/S Psych:  normally interactive, flat affect, and subdued.     Impression & Recommendations:  Problem # 1:  HYPOTHYROIDISM (ICD-244.9) Assessment Unchanged TSH in nl range, but could be lower  will increased dose to 100 micrograms--she will start this after she completes current and gets branded from manufacturer will need repeat TSH in 4-6 wks after start of increased dose--voices understanding The following medications were removed from the medication list:    Synthroid 88 Mcg Tabs (Levothyroxine sodium) .Marland Kitchen... 1 once daily for thyroid Her updated medication list for this problem includes:    Synthroid 100 Mcg Tabs (Levothyroxine sodium) .Marland Kitchen... 1 daily for thyroid  Labs Reviewed: TSH: 2.01 (07/16/2008)    Chol: 168 (03/06/2007)   HDL: 33.2 (03/06/2007)   LDL: 114 (03/06/2007)   TG: 103 (03/06/2007)  Problem # 2:  FOOT PAIN (ICD-729.5) Assessment: Comment Only reviewed with her the several surgeries to R foot and her recent referral to [facility] orthopediest--she is not happy with her repair no comment made by me  Problem # 3:  ALLERGIC RHINITIS (ICD-477.9) Assessment: Unchanged ear pain/fullness from nasal congestion to continue flonase and zyrtec as she  is now taking--appear to be doing above average job Her updated medication list for this problem includes:    Zyrtec Allergy 10 Mg Tabs (Cetirizine hcl) .Marland Kitchen... Take 1 tablet by mouth once a day as needed    Flonase 50 Mcg/act Susp (Fluticasone propionate) .Marland Kitchen... 2 sprays to each nostril daily  Problem # 4:  HEADACHE (ICD-784.0) Assessment: New suspect HA due to nasal congestion encouraged to continue tx as in #3--see back if not improved as allergy season deminishes Her updated medication list for this problem includes:    Diclofenac Sodium 75 Mg Tbec (Diclofenac sodium)  .Marland Kitchen... Take 1 tablet by mouth every 12 hours  Complete Medication List: 1)  Nexium 40 Mg Cpdr (Esomeprazole magnesium) .... Take 1 capsule by mouth once a day 2)  Zyrtec Allergy 10 Mg Tabs (Cetirizine hcl) .... Take 1 tablet by mouth once a day as needed 3)  Fish Oil 1000 Mg Caps (Omega-3 fatty acids) .... 3 every day 4)  Wellbutrin Xl 150 Mg Tb24 (Bupropion hcl) .... Take 3 every am by mouth for depression 5)  Albuterol 90 Mcg/act Aers (Albuterol) .... As needed 6)  Diclofenac Sodium 75 Mg Tbec (Diclofenac sodium) .... Take 1 tablet by mouth every 12 hours 7)  Flonase 50 Mcg/act Susp (Fluticasone propionate) .... 2 sprays to each nostril daily 8)  Synthroid 100 Mcg Tabs (Levothyroxine sodium) .Marland Kitchen.. 1 daily for thyroid  Patient Instructions: 1)  will need TSH --244.9 6 wks after start of Synthroid Prescriptions: NEXIUM 40 MG CPDR (ESOMEPRAZOLE MAGNESIUM) Take 1 capsule by mouth once a day  #90 x 4   Entered and Authorized by:   Gildardo Griffes FNP   Signed by:   Gildardo Griffes FNP on 10/01/2008   Method used:   Print then Give to Patient   RxID:   [id number] SYNTHROID 100 MCG TABS (LEVOTHYROXINE SODIUM) 1 daily for thyroid  #90 x 4   Entered and Authorized by:   Gildardo Griffes FNP   Signed by:   Gildardo Griffes FNP on 10/01/2008   Method used:   Print then Give to Patient   RxID:   [id number]   Current Allergies (reviewed today): * NIASPAN * OXYCODONE

## 2010-06-30 NOTE — Assessment & Plan Note (Signed)
Summary: COUGH/CLE   Vital Signs:  Patient Profile:   60 Years Old Female Height:     60 inches Weight:      180.50 pounds Temp:     98.1 degrees F oral Pulse rate:   88 / minute Pulse rhythm:   regular BP sitting:   152 / 90  (left arm) Cuff size:   regular  Vitals Entered By: Delilah Shan (March 11, 2008 10:52 AM)                 Chief Complaint:  1.  Cough    2.  Needs Rx. for Albuterol Inhaler and Flonase or Veramyst.  Acute Visit History:      The patient complains of cough, nasal discharge, and vomiting.  These symptoms began 3 days ago.  She denies earache, fever, nausea, sinus problems, and sore throat.  Other comments include: chest hurts, back hurts one episode of vomiting 3 days ago after taking medication using albuterol occ .        The patient notes wheezing and shortness of breath.  The character of the cough is described as productive, yellow green.          Current Allergies (reviewed today): * NIASPAN * OXYCODONE  Past Medical History:    Reviewed history from 08/14/2007 and no changes required:       Anxiety       Depression       GERD       Hypothyroidism       Skin cancers on face and arm       Allergic rhinitis       COPD    Social History:    Reviewed history from 08/14/2007 and no changes required:       Marital Status: Married       Children:        Occupation: bookkeeper       Current Smoker, 1/2 pack a day    Review of Systems      See HPI   Physical Exam  General:     Fatigued appearing female in NAD Head:     no maxillary sinus ttp Ears:     External ear exam shows no significant lesions or deformities.  Otoscopic examination reveals clear canals, tympanic membranes are intact bilaterally without bulging, retraction, inflammation or discharge. Hearing is grossly normal bilaterally. Nose:     clear nasal discharge Mouth:     pharyngeal erythema, no exudate, MMM Neck:     .noeds  Lungs:     occ exp wheeze,  ptherwise clear to auscultaion, no increased work o breathing Heart:     Normal rate and regular rhythm. S1 and S2 normal without gallop, murmur, click, rub or other extra sounds.    Impression & Recommendations:  Problem # 1:  CHRONIC OBSTRUCTIVE PULMONARY DISEASE, ACUTE EXACERBATION (ICD-491.21) Treat with steroid taperand antibiotics.  Use mucinex as a mucolytic.  Call if not improving. Go to ER for severe SOB.   Complete Medication List: 1)  Nexium 40 Mg Cpdr (Esomeprazole magnesium) .... Take 1 capsule by mouth once a day 2)  Flonase 50 Mcg/act Susp (Fluticasone propionate) .... Use as directed 3)  Armour Thyroid 90 Mg  .... One by mouth once daily 4)  Zyrtec Allergy 10 Mg Tabs (Cetirizine hcl) .... Take 1 tablet by mouth once a day as needed 5)  Fish Oil 1000 Mg Caps (Omega-3 fatty acids) .... 3 every day 6)  Wellbutrin Xl 150 Mg Tb24 (Bupropion hcl) .... Take 3 qam by mouth for depression 7)  Albuterol 90 Mcg/act Aers (Albuterol) .... As needed 8)  Diclofenac Sodium 75 Mg Tbec (Diclofenac sodium) .... Take 1 tablet by mouth every 12 hours 9)  Synthroid 88 Mcg Tabs (Levothyroxine sodium) .Marland Kitchen.. 1 once daily for thyroid 10)  Azithromycin 250 Mg Tabs (Azithromycin) .... 2 tab by mouth x 1 day, then 1 tab by mouth daily 11)  Prednisone 20 Mg Tabs (Prednisone) .... 3 tabs by mouth daily x 3 days, then 2 tabs by mouth daily x 2 days then 1 tab by mouth daily x 2 days  Other Orders: Admin 1st Vaccine ([address]) Flu Vaccine 44yrs + (60454)    Prescriptions: PREDNISONE 20 MG TABS (PREDNISONE) 3 tabs by mouth daily x 3 days, then 2 tabs by mouth daily x 2 days then 1 tab by mouth daily x 2 days  #15 x 0   Entered and Authorized by:   Kerby Nora MD   Signed by:   Kerby Nora MD on 03/11/2008   Method used:   Electronically to        [facility]* (retail)       [address]       [place], [place]  [address]       Ph: [phone]       Fax: [phone]   RxID:   [id number]  AZITHROMYCIN 250 MG TABS (AZITHROMYCIN) 2 tab by mouth x 1 day, then 1 tab by mouth daily  #6 x 0   Entered and Authorized by:   Kerby Nora MD   Signed by:   Kerby Nora MD on 03/11/2008   Method used:   Electronically to        [facility]* (retail)       [address]       [place], [place]  [address]       Ph: [phone]       Fax: [phone]   RxID:   [id number] ALBUTEROL 90 MCG/ACT  AERS (ALBUTEROL) as needed  #1 x 2   Entered and Authorized by:   Kerby Nora MD   Signed by:   Kerby Nora MD on 03/11/2008   Method used:   Electronically to        [facility]* (retail)       [address]       [place], [place]  [address]       Ph: [phone]       Fax: [phone]   RxID:   [id number] FLONASE 50 MCG/ACT  SUSP (FLUTICASONE PROPIONATE) use as directed  #1 x 3   Entered and Authorized by:   Kerby Nora MD   Signed by:   Kerby Nora MD on 03/11/2008   Method used:   Electronically to        [facility]* (retail)       [address]       [place], [place]  [address]       Ph: [phone]       Fax: [phone]   RxID:   [id number]  ] Current Allergies (reviewed today): * NIASPAN * OXYCODONE Current Medications (including changes made in today's visit):  NEXIUM 40 MG CPDR (ESOMEPRAZOLE MAGNESIUM) Take 1 capsule by mouth once a day FLONASE 50 MCG/ACT  SUSP (FLUTICASONE PROPIONATE) use as directed * ARMOUR THYROID 90 MG one by mouth once daily ZYRTEC ALLERGY 10 MG  TABS (CETIRIZINE HCL) Take 1 tablet  by mouth once a day as needed FISH OIL 1000 MG  CAPS (OMEGA-3 FATTY ACIDS) 3 every day WELLBUTRIN XL 150 MG  TB24 (BUPROPION HCL) take 3 qam by mouth for depression ALBUTEROL 90 MCG/ACT  AERS (ALBUTEROL) as needed DICLOFENAC SODIUM 75 MG  TBEC (DICLOFENAC SODIUM) Take 1 tablet by mouth every 12 hours SYNTHROID 88 MCG  TABS (LEVOTHYROXINE SODIUM) 1 once daily for thyroid AZITHROMYCIN 250 MG TABS (AZITHROMYCIN) 2 tab by mouth x 1 day, then 1 tab by  mouth daily PREDNISONE 20 MG TABS (PREDNISONE) 3 tabs by mouth daily x 3 days, then 2 tabs by mouth daily x 2 days then 1 tab by mouth daily x 2 days     Influenza Vaccine (to be given today)     Flu Vaccine Consent Questions     Do you have a history of severe allergic reactions to this vaccine? no    Any prior history of allergic reactions to egg and/or gelatin? no    Do you have a sensitivity to the preservative Thimersol? no    Do you have a past history of Guillan-Barre Syndrome? no    Do you currently have an acute febrile illness? no    Have you ever had a severe reaction to latex? no    Vaccine information given and explained to patient? yes    Are you currently pregnant? no    Lot Number:[id number]   Site Given  Left Deltoid IMpcflu Delilah Shan  March 11, 2008 11:38 AM

## 2010-07-02 ENCOUNTER — Encounter: Payer: Self-pay | Admitting: Family Medicine

## 2010-07-02 NOTE — Assessment & Plan Note (Signed)
Summary: 30 MIN APPT 3 MONTH FOLLOW UP MULTIPLE MEDICAL ISSUES/[facility]   Vital Signs:  Patient profile:   60 year old female Height:      60 inches Weight:      180.75 pounds BMI:     35.43 Temp:     97.9 degrees F oral Pulse rate:   80 / minute Pulse rhythm:   regular BP sitting:   120 / 70  (left arm) Cuff size:   regular  Vitals Entered By: Benny Lennert CMA Duncan Dull) (June 26, 2010 8:25 AM)  History of Present Illness: Chief complaint 3 month follow up multiple medical issues   High cholesterol: LDL worse than last check.. triglycerides a little better.  Has not restarted  fish oil yet. Husband in hospital.. she has not been working on diet changes.    Hypothyroidism.. on higher dose synthroid.TSH: 1.77 (04/29/2010 8:21:47 AM)  Before increase.. constipation, fatigue, hair thinning. Per pt...minimal change in energy level.  Eyebrows thinning.   Constipation improved with miralax.  Trigger finger.. right thumb.. pain in base and triggering.   Allergies: 1)  * Niaspan 2)  * Oxycodone  Review of Systems General:  Denies fatigue and fever. CV:  Denies chest pain or discomfort. Resp:  Complains of shortness of breath; told she has CPD in  gradaully worsening. never had PFTs in past. past... has noted occ mild SOb. GI:  Complains of indigestion; denies abdominal pain; last night after BBQ.Marland Kitchen  Physical Exam  General:  overweight female in NAD   Mouth:  Oral mucosa and oropharynx without lesions or exudates.  Teeth in good repair. Neck:  no carotid bruit or thyromegaly no cervical or supraclavicular lymphadenopathy  Lungs:  Normal respiratory effort, chest expands symmetrically. Lungs are clear to auscultation, no crackles or wheezes. Heart:  Normal rate and regular rhythm. S1 and S2 normal without gallop, murmur, click, rub or other extra sounds. Abdomen:  Bowel sounds positive,abdomen soft and non-tender without masses, organomegaly or hernias noted. Msk:  riht thumb...  tender at MCP distally.. nodule palpated, thumb triggers.  Pulses:  R and L posterior tibial pulses are full and equal bilaterally  Extremities:  no edema Skin:  Intact without suspicious lesions or rashes Psych:  Cognition and judgment appear intact. Alert and cooperative with normal attention span and concentration. No apparent delusions, illusions, hallucinations   Impression & Recommendations:  Problem # 1:  HYPERCHOLESTEROLEMIA (ICD-272.0) Poor control. Pt resistant to meds. Wishes to try lifestyle changes... plans to actually start fish oil and get back on track. Husband out of hospital now. Recheck in 3 months.   Problem # 2:  HYPOTHYROIDISM (ICD-244.9) Fatigue continued.. will titrate up synthroid to determine if symptoms alkleviated. recheck TSH in 4-6 weeks.  Her updated medication list for this problem includes:    Synthroid 125 Mcg Tabs (Levothyroxine sodium) .Marland Kitchen... Take 1 tablet by mouth once a day  Problem # 3:  TRIGGER FINGER, RIGHT THUMB (ICD-727.03)  Ice, NSASIDs.Marland Kitchen referral to hand specialist for steroid injection.   Orders: Orthopedic Surgeon Referral (Ortho Surgeon)  Problem # 4:  COPD (ICD-496) Never had PFTs.. shcedule in 1-2 months. Pt precontemplative about smoking cessation.  Her updated medication list for this problem includes:    Proair Hfa 108 (90 Base) Mcg/act Aers (Albuterol sulfate) .Marland Kitchen... 2 puffs every 4 hrs as needed chest tightness or wheezing  Complete Medication List: 1)  Nexium 40 Mg Cpdr (Esomeprazole magnesium) .... Take 1 capsule by mouth once a day 2)  Fexofenadine Hcl 180 Mg Tabs (Fexofenadine hcl) .Marland Kitchen.. 1 tab by mouth daily 3)  Fish Oil 1000 Mg Caps (Omega-3 fatty acids) .... 3 every day - on hold for now 4)  Wellbutrin Xl 150 Mg Tb24 (Bupropion hcl) .... Take 3 every am by mouth for depression 5)  Flonase 50 Mcg/act Susp (Fluticasone propionate) .... 2 sprays to each nostril daily 6)  Synthroid 125 Mcg Tabs (Levothyroxine sodium) .... Take 1  tablet by mouth once a day 7)  Bc Fast Pain Relief 845-65 Mg Pack (Aspirin-caffeine) .Marland Kitchen.. 1 by mouth as needed 8)  Vitamin D 1000mg   .... Take two tablets once daily 9)  Proair Hfa 108 (90 Base) Mcg/act Aers (Albuterol sulfate) .... 2 puffs every 4 hrs as needed chest tightness or wheezing 10)  Flexeril 10 Mg Tabs (Cyclobenzaprine hcl) .... Take one by mouth three times a day as needed muscle spasm, sedation precautions 11)  Diclofenac Sodium 75 Mg Tbec (Diclofenac sodium) .... Take one by mouth two times a day  Patient Instructions: 1)  Increase thyroid medicine. 2)   Return in 4-6 weeks for TSH recheck.. Dx 244.9 3)   Increase exercsie .Marland Kitchen lower fat, cholesterol in diet. 4)   Start fish oil  2000 mg divided daily. 5)  Return for lipids in 3 months.. Dx 272.0. 6)  Referral Appointment Information 7)  Day/Date: 8)  Time: 9)  Place/MD: 10)  Address: 11)  Phone/Fax: 12)  Patient given appointment information. Information/Orders faxed/mailed.  13)  Follow up appt in 1-2 months.. for PFT to evaluate COPD/SOB. Prescriptions: SYNTHROID 125 MCG TABS (LEVOTHYROXINE SODIUM) Take 1 tablet by mouth once a day  #30 x 3   Entered and Authorized by:   Kerby Nora MD   Signed by:   Kerby Nora MD on 06/26/2010   Method used:   Electronically to        [facility]  [address] (retail)       [address]       [place], [place]  [address]       Ph: [phone] or [phone]       Fax: [phone]   RxID:   [id number]    Orders Added: 1)  Orthopedic Surgeon Referral [Ortho Surgeon] 2)  Est. Patient Level IV [76734]    Current Allergies (reviewed today): * NIASPAN * OXYCODONE

## 2010-07-16 NOTE — Consult Note (Signed)
Summary: [facility]   Imported By: Maryln Gottron 07/09/2010 15:56:15  _____________________________________________________________________  External Attachment:    Type:   Image     Comment:   External Document

## 2010-07-30 DIAGNOSIS — I639 Cerebral infarction, unspecified: Secondary | ICD-10-CM

## 2010-07-30 HISTORY — DX: Cerebral infarction, unspecified: I63.9

## 2010-07-31 ENCOUNTER — Encounter: Payer: Self-pay | Admitting: Family Medicine

## 2010-08-07 ENCOUNTER — Other Ambulatory Visit: Payer: Self-pay | Admitting: Family Medicine

## 2010-08-07 ENCOUNTER — Encounter (INDEPENDENT_AMBULATORY_CARE_PROVIDER_SITE_OTHER): Payer: Self-pay | Admitting: *Deleted

## 2010-08-07 ENCOUNTER — Other Ambulatory Visit (INDEPENDENT_AMBULATORY_CARE_PROVIDER_SITE_OTHER): Payer: BC Managed Care – PPO

## 2010-08-07 DIAGNOSIS — E039 Hypothyroidism, unspecified: Secondary | ICD-10-CM

## 2010-08-07 LAB — TSH: TSH: 0.57 u[IU]/mL (ref 0.35–5.50)

## 2010-08-08 ENCOUNTER — Encounter: Payer: Self-pay | Admitting: Family Medicine

## 2010-08-11 NOTE — Letter (Signed)
Summary: [facility]   Imported By: Kassie Mends 08/07/2010 08:10:43  _____________________________________________________________________  External Attachment:    Type:   Image     Comment:   External Document

## 2010-08-17 ENCOUNTER — Ambulatory Visit: Payer: Self-pay | Admitting: Family Medicine

## 2010-08-24 ENCOUNTER — Telehealth: Payer: Self-pay | Admitting: *Deleted

## 2010-08-24 ENCOUNTER — Inpatient Hospital Stay (HOSPITAL_COMMUNITY)
Admission: EM | Admit: 2010-08-24 | Discharge: 2010-08-26 | DRG: 880 | Disposition: A | Discharge status: Home / Self Care | Payer: BC Managed Care – PPO | Source: Ambulatory Visit | Attending: Neurology | Admitting: Neurology

## 2010-08-24 ENCOUNTER — Emergency Department (HOSPITAL_COMMUNITY): Payer: BC Managed Care – PPO

## 2010-08-24 DIAGNOSIS — F17200 Nicotine dependence, unspecified, uncomplicated: Secondary | ICD-10-CM | POA: Diagnosis present

## 2010-08-24 DIAGNOSIS — R4701 Aphasia: Secondary | ICD-10-CM | POA: Diagnosis present

## 2010-08-24 DIAGNOSIS — K219 Gastro-esophageal reflux disease without esophagitis: Secondary | ICD-10-CM | POA: Diagnosis present

## 2010-08-24 DIAGNOSIS — R471 Dysarthria and anarthria: Secondary | ICD-10-CM | POA: Diagnosis present

## 2010-08-24 DIAGNOSIS — I672 Cerebral atherosclerosis: Secondary | ICD-10-CM | POA: Diagnosis present

## 2010-08-24 DIAGNOSIS — M549 Dorsalgia, unspecified: Secondary | ICD-10-CM | POA: Diagnosis present

## 2010-08-24 DIAGNOSIS — E669 Obesity, unspecified: Secondary | ICD-10-CM | POA: Diagnosis present

## 2010-08-24 DIAGNOSIS — E785 Hyperlipidemia, unspecified: Secondary | ICD-10-CM | POA: Diagnosis present

## 2010-08-24 DIAGNOSIS — Z7982 Long term (current) use of aspirin: Secondary | ICD-10-CM

## 2010-08-24 DIAGNOSIS — E039 Hypothyroidism, unspecified: Secondary | ICD-10-CM | POA: Diagnosis present

## 2010-08-24 DIAGNOSIS — G8190 Hemiplegia, unspecified affecting unspecified side: Secondary | ICD-10-CM | POA: Diagnosis present

## 2010-08-24 DIAGNOSIS — G8929 Other chronic pain: Secondary | ICD-10-CM | POA: Diagnosis present

## 2010-08-24 DIAGNOSIS — I6350 Cerebral infarction due to unspecified occlusion or stenosis of unspecified cerebral artery: Principal | ICD-10-CM | POA: Diagnosis present

## 2010-08-24 LAB — COMPREHENSIVE METABOLIC PANEL
ALT: 21 U/L (ref 0–35)
AST: 22 U/L (ref 0–37)
Albumin: 3.6 g/dL (ref 3.5–5.2)
Alkaline Phosphatase: 95 U/L (ref 39–117)
BUN: 11 mg/dL (ref 6–23)
CO2: 24 mEq/L (ref 19–32)
Calcium: 8.8 mg/dL (ref 8.4–10.5)
Chloride: 100 mEq/L (ref 96–112)
Creatinine, Ser: 0.71 mg/dL (ref 0.4–1.2)
GFR calc Af Amer: >60 mL/min (ref >60)
GFR calc non Af Amer: >60 mL/min (ref >60)
Glucose, Bld: 106 mg/dL — ABNORMAL HIGH (ref 70–99)
Potassium: 4.1 mEq/L (ref 3.5–5.1)
Sodium: 131 mEq/L — ABNORMAL LOW (ref 135–145)
Total Bilirubin: 0.5 mg/dL (ref 0.3–1.2)
Total Protein: 6.7 g/dL (ref 6.0–8.3)

## 2010-08-24 LAB — CBC
HCT: 40 % (ref 36.0–46.0)
Hemoglobin: 13.7 g/dL (ref 12.0–15.0)
MCH: 29 pg (ref 26.0–34.0)
MCHC: 34.3 g/dL (ref 30.0–36.0)
MCV: 84.7 fL (ref 78.0–100.0)
Platelets: 366 10*3/uL (ref 150–400)
RBC: 4.72 MIL/uL (ref 3.87–5.11)
RDW: 13.9 % (ref 11.5–15.5)
WBC: 7.9 10*3/uL (ref 4.0–10.5)

## 2010-08-24 LAB — URINALYSIS, ROUTINE W REFLEX MICROSCOPIC
Bilirubin Urine: NEGATIVE
Glucose, UA: NEGATIVE mg/dL
Ketones, ur: NEGATIVE mg/dL
Leukocytes, UA: NEGATIVE
Nitrite: NEGATIVE
Protein, ur: NEGATIVE mg/dL
Specific Gravity, Urine: 1.008 (ref 1.005–1.030)
Urobilinogen, UA: 0.2 mg/dL (ref 0.0–1.0)
pH: 6.5 (ref 5.0–8.0)

## 2010-08-24 LAB — POCT I-STAT, CHEM 8
BUN: 12 mg/dL (ref 6–23)
Calcium, Ion: 1.08 mmol/L — ABNORMAL LOW (ref 1.12–1.32)
Chloride: 101 mEq/L (ref 96–112)
Creatinine, Ser: 0.9 mg/dL (ref 0.4–1.2)
Glucose, Bld: 103 mg/dL — ABNORMAL HIGH (ref 70–99)
HCT: 43 % (ref 36.0–46.0)
Hemoglobin: 14.6 g/dL (ref 12.0–15.0)
Potassium: 4 mEq/L (ref 3.5–5.1)
Sodium: 136 mEq/L (ref 135–145)
TCO2: 24 mmol/L (ref 0–100)

## 2010-08-24 LAB — CK TOTAL AND CKMB (NOT AT ARMC)
CK, MB: 1.9 ng/mL (ref 0.3–4.0)
Relative Index: INVALID (ref 0.0–2.5)
Total CK: 91 U/L (ref 7–177)

## 2010-08-24 LAB — DIFFERENTIAL
Basophils Absolute: 0.1 10*3/uL (ref 0.0–0.1)
Basophils Relative: 1 % (ref 0–1)
Eosinophils Absolute: 0.2 10*3/uL (ref 0.0–0.7)
Eosinophils Relative: 2 % (ref 0–5)
Lymphocytes Relative: 39 % (ref 12–46)
Lymphs Abs: 3.1 10*3/uL (ref 0.7–4.0)
Monocytes Absolute: 0.6 10*3/uL (ref 0.1–1.0)
Monocytes Relative: 8 % (ref 3–12)
Neutro Abs: 3.9 10*3/uL (ref 1.7–7.7)
Neutrophils Relative %: 50 % (ref 43–77)

## 2010-08-24 LAB — GLUCOSE, CAPILLARY
Glucose-Capillary: 82 mg/dL (ref 70–99)
Glucose-Capillary: 128 mg/dL — ABNORMAL HIGH (ref 70–99)

## 2010-08-24 LAB — MRSA PCR SCREENING: MRSA by PCR: NEGATIVE

## 2010-08-24 LAB — URINE MICROSCOPIC-ADD ON

## 2010-08-24 LAB — APTT: aPTT: 32 seconds (ref 24–37)

## 2010-08-24 LAB — PROTIME-INR
INR: 0.92 (ref 0.00–1.49)
Prothrombin Time: 12.6 seconds (ref 11.6–15.2)

## 2010-08-24 LAB — TROPONIN I: Troponin I: 0.01 ng/mL (ref 0.00–0.06)

## 2010-08-24 NOTE — Telephone Encounter (Signed)
Daughter says that patient is very lethargic, one side of her face is drooping, having hard time walking. Advised daughter to take mother to ER. She agreed, says that she is going to take her now.

## 2010-08-24 NOTE — Telephone Encounter (Signed)
Very much agree

## 2010-08-25 ENCOUNTER — Inpatient Hospital Stay (HOSPITAL_COMMUNITY): Payer: BC Managed Care – PPO

## 2010-08-25 ENCOUNTER — Telehealth: Payer: Self-pay | Admitting: *Deleted

## 2010-08-25 DIAGNOSIS — I517 Cardiomegaly: Secondary | ICD-10-CM

## 2010-08-25 LAB — LIPID PANEL
Cholesterol: 196 mg/dL (ref 0–200)
HDL: 30 mg/dL — ABNORMAL LOW (ref >39)
LDL Cholesterol: 148 mg/dL — ABNORMAL HIGH (ref 0–99)
Total CHOL/HDL Ratio: 6.5 RATIO
Triglycerides: 92 mg/dL (ref <150)
VLDL: 18 mg/dL (ref 0–40)

## 2010-08-25 LAB — HEMOGLOBIN A1C
Hgb A1c MFr Bld: 6 % — ABNORMAL HIGH (ref <5.7)
Mean Plasma Glucose: 126 mg/dL — ABNORMAL HIGH (ref <117)

## 2010-08-25 LAB — GLUCOSE, CAPILLARY
Glucose-Capillary: 98 mg/dL (ref 70–99)
Glucose-Capillary: 97 mg/dL (ref 70–99)
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
## 2010-08-25 NOTE — Telephone Encounter (Signed)
Member of [facility] stroke team called to let you know that pt has had a stroke and is in the hospital, room 3103.

## 2010-08-26 NOTE — Consult Note (Signed)
NAME:  Tiffany Orozco, Tiffany Orozco              ACCOUNT NO.:  [id number]  MEDICAL RECORD NO.:  [id number]           PATIENT TYPE:  I  LOCATION:  3014                         FACILITY:  [facility]  PHYSICIAN:  Ameliah Baskins P. Pearlean Brownie, MD    DATE OF BIRTH:  1951-01-20  DATE OF CONSULTATION: DATE OF DISCHARGE:                                CONSULTATION   REFERRING PHYSICIAN:  Levie Heritage, MD  REASON FOR REFERRAL:  Stroke.  HISTORY OF PRESENT ILLNESS:  Ms. Lomeli is a 60 year old Caucasian lady who developed sudden onset of expressive aphasia and right hemiplegia yesterday, onset around 11:00 a.m.  The patient had just driven to the daughter's home when she noticed that she had difficulty getting out of her car.  She could not move her right leg easily.  She was eventually able to get up and walk with dragging of the right side.  She was able to let herself in with using a key, but dropped her cup from her hand on the floor.  She sat down, and the daughter was in the next room came by and noticed that something was wrong.  The patient was unable to speak, but could understand and respond to the daughter with simple yes and no questions.  The patient on the arrival was found to have expressive aphasia and right hemiplegia with NIH stroke scale of 7 when examined by the stroke nurse practitioner, but she improved and when examined by Dr. Hoy Morn, was found to have NIH stroke scale of 5, but still had some mild right facial and arm and leg weakness and dysarthria.  She was given full-dose IV t-PA uneventfully, and has done well with complete improvement in her symptoms.  She had undergone a limited MRI scan prior to the t-PA, which confirmed an acute infarct in the left corona radiata.  She has no known prior history of stroke, TIA, seizures, or neurological problems, but upon inquiry, states she had some drooling from the right corner of the face in the last week off and on as well as some intermittent  difficulty using her hands, but she feels this may be related to a frozen thumb that she has.  PAST MEDICAL HISTORY:  Significant for hyperlipidemia, smoking, mild obesity.  Chronic back pain with multiple surgeries and residual right foot numbness.  MEDICATION ALLERGIES LISTED:  CODEINE.  HOME MEDICATIONS: 1. Wellbutrin. 2. Nexium. 3. Percocet 1-2 tablets every 6 hourly. 4. Skelaxin 800 3 times a day.  SOCIAL HISTORY:  Married, lives with her husband, currently smokes and does not drink.  REVIEW OF SYSTEMS:  Negative for fever, cough, chest pain, diarrhea or other illness.  Positive for chronic back pain, right leg numbness.  PHYSICAL EXAMINATION:  GENERAL:  Obese middle-aged Caucasian lady who is currently not in distress. VITAL SIGNS;  She is afebrile today, temperature 98, pulse is 73 per minute, regular sinus, respiratory 17 per minute, blood pressure 131/62. Distal pulses well felt. HEAD:  Nontraumatic. EARS:  Hearing appears normal. NECK:  Supple without bruit. CARDIAC:  No murmur or gallop. LUNGS:  Clear to auscultation. ABDOMEN:  Soft, nontender. NEUROLOGIC:  She is awake, alert, oriented to time, place, and person. Speech appears quite fluent and normal.  She has intact naming, comprehension, and repetition.  There is no dysarthria or aphasia.  Eye movements are full range without nystagmus.  Visual fields are full to confrontational testing.  Face is symmetric.  Palatal movements are normal.  Tongue is midline.  Motor system exam reveals no upper or lower extremity drift, symmetric and equal strength in all four extremities. Deep tendon reflexes are 2+ symmetric.  Plantars are downgoing.  Ankle jerks are slightly depressed.  There is mild numbness in the right foot from the knee down, which is old as per the patient.  DATA REVIEWED:  MRI scan of the brain limited study only, diffusion weighted images; and ADC map was obtained, which show acute infarct in the  left corona radiata.  Hemoglobin A1c 6.0, LDL cholesterol is elevated at 148, HDL 30, triglycerides 92, cholesterol 196.  Carotid Doppler showed no significant extracranial stenosis.  2D echo and transcranial Doppler study done, but results are pending.  IMPRESSION:  A 59-year lady with left deep white matter infarct, etiology to be determined but possibly small vessel disease.  The patient has been treated with IV t-PA with excellent clinical recovery. Vascular risk factors of hyperlipidemia, obesity, and smoking.  PLAN:  Check followup CT scan at noon today, negative for hemorrhage, start Aggrenox for secondary stroke prevention.  Add Zocor for hyperlipidemia.  I have counseled the patient to quit smoking.  I have also advised the patient to go on a diet and regular exercise and lose weight.  I had long discussion with the patient and her husband regarding the above plan and treatment, answered questions.  The patient may also consider possible participation in IRIS stroke prevention trial if interested.     Sullivan Blasing P. Pearlean Brownie, MD     PPS/MEDQ  D:  08/25/2010  T:  08/26/2010  Job:  [id number]  cc:   Kerby Nora, MD  Electronically Signed by Delia Heady MD on 08/26/2010 11:11:47 AM

## 2010-09-08 ENCOUNTER — Telehealth: Payer: Self-pay | Admitting: *Deleted

## 2010-09-08 NOTE — Telephone Encounter (Signed)
Pt has been released from the hospital after her stroke and they told her to follow up with her PCP within 2 weeks.  She has appt to see you on 5/01 and is asking if that will be soon enough. She says she feels fine, other than slight problems with concentration.

## 2010-09-09 NOTE — Telephone Encounter (Signed)
Patient advised.

## 2010-09-09 NOTE — Telephone Encounter (Signed)
That should be fine. 

## 2010-09-14 ENCOUNTER — Other Ambulatory Visit: Payer: Self-pay | Admitting: *Deleted

## 2010-09-14 DIAGNOSIS — E7800 Pure hypercholesterolemia, unspecified: Secondary | ICD-10-CM

## 2010-09-22 ENCOUNTER — Other Ambulatory Visit (INDEPENDENT_AMBULATORY_CARE_PROVIDER_SITE_OTHER): Payer: BC Managed Care – PPO | Admitting: Family Medicine

## 2010-09-22 DIAGNOSIS — E7800 Pure hypercholesterolemia, unspecified: Secondary | ICD-10-CM

## 2010-09-22 LAB — LIPID PANEL
Cholesterol: 125 mg/dL (ref 0–200)
HDL: 34.5 mg/dL — ABNORMAL LOW (ref >39.00)
LDL Cholesterol: 66 mg/dL (ref 0–99)
Total CHOL/HDL Ratio: 4
Triglycerides: 125 mg/dL (ref 0.0–149.0)
VLDL: 25 mg/dL (ref 0.0–40.0)
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
## 2010-09-23 ENCOUNTER — Other Ambulatory Visit: Payer: Self-pay | Admitting: Family Medicine

## 2010-09-25 ENCOUNTER — Other Ambulatory Visit: Payer: Self-pay

## 2010-09-28 ENCOUNTER — Ambulatory Visit: Payer: Self-pay | Admitting: Family Medicine

## 2010-09-29 ENCOUNTER — Encounter: Payer: Self-pay | Admitting: Family Medicine

## 2010-09-29 ENCOUNTER — Ambulatory Visit (INDEPENDENT_AMBULATORY_CARE_PROVIDER_SITE_OTHER): Payer: BC Managed Care – PPO | Admitting: Family Medicine

## 2010-09-29 DIAGNOSIS — M791 Myalgia, unspecified site: Secondary | ICD-10-CM

## 2010-09-29 DIAGNOSIS — I639 Cerebral infarction, unspecified: Secondary | ICD-10-CM

## 2010-09-29 DIAGNOSIS — K219 Gastro-esophageal reflux disease without esophagitis: Secondary | ICD-10-CM

## 2010-09-29 DIAGNOSIS — J309 Allergic rhinitis, unspecified: Secondary | ICD-10-CM

## 2010-09-29 DIAGNOSIS — E039 Hypothyroidism, unspecified: Secondary | ICD-10-CM

## 2010-09-29 DIAGNOSIS — IMO0001 Reserved for inherently not codable concepts without codable children: Secondary | ICD-10-CM

## 2010-09-29 DIAGNOSIS — E7800 Pure hypercholesterolemia, unspecified: Secondary | ICD-10-CM

## 2010-09-29 DIAGNOSIS — J449 Chronic obstructive pulmonary disease, unspecified: Secondary | ICD-10-CM

## 2010-09-29 DIAGNOSIS — I6350 Cerebral infarction due to unspecified occlusion or stenosis of unspecified cerebral artery: Secondary | ICD-10-CM

## 2010-09-29 DIAGNOSIS — F17200 Nicotine dependence, unspecified, uncomplicated: Secondary | ICD-10-CM

## 2010-09-29 LAB — HEPATIC FUNCTION PANEL
ALT: 24 U/L (ref 0–35)
AST: 19 U/L (ref 0–37)
Albumin: 3.9 g/dL (ref 3.5–5.2)
Alkaline Phosphatase: 87 U/L (ref 39–117)
Bilirubin, Direct: 0 mg/dL (ref 0.0–0.3)
Total Bilirubin: 0.3 mg/dL (ref 0.3–1.2)
Total Protein: 7 g/dL (ref 6.0–8.3)

## 2010-09-29 LAB — CK: Total CK: 59 U/L (ref 7–177)

## 2010-09-29 MED ORDER — PANTOPRAZOLE SODIUM 40 MG PO TBEC: 40.0000 mg | DELAYED_RELEASE_TABLET | Freq: Every day | ORAL | Status: DC

## 2010-09-29 MED ORDER — FLUTICASONE PROPIONATE 50 MCG/ACT NA SUSP: 2.0000 | Freq: Every day | NASAL | Status: DC

## 2010-09-29 MED ORDER — LEVOTHYROXINE SODIUM 125 MCG PO TABS: 125.0000 ug | ORAL_TABLET | Freq: Every day | ORAL | Status: DC

## 2010-09-29 NOTE — Assessment & Plan Note (Signed)
Given other ongoing issues.. Will postpone PFts until CPX in 3 months.

## 2010-09-29 NOTE — Progress Notes (Signed)
Addended byMills Koller on: 09/29/2010 09:03 AM   Modules accepted: Orders

## 2010-09-29 NOTE — Patient Instructions (Signed)
We will call with lab results.  Continue current meds. Continue to work on [employer] and weight loss as well as healthy eating habits. Review low chol info.

## 2010-09-29 NOTE — Assessment & Plan Note (Addendum)
MAy be contributing to headache. Will refill generic fluticasone as this has helped in past.

## 2010-09-29 NOTE — Assessment & Plan Note (Signed)
TSH at goal on current dose and fatigue is improved.

## 2010-09-29 NOTE — Assessment & Plan Note (Signed)
Improved control... At goal <70. SE of myalgia.. Fairly severe per pt but she understands need for the med.  Will check CK today, but she will remain on med for now.

## 2010-09-29 NOTE — Assessment & Plan Note (Signed)
New diagnosis.. followed by Dr.Sethi.  Continue aggrenox.

## 2010-09-29 NOTE — Progress Notes (Signed)
Subjective:    Patient ID: Tiffany Orozco, female    DOB: [birth year], 60 y.o.   MRN: [id number]  HPI  Hospitalized 3/26-3/28 for left lenticulostriate infarct, probable cause secondary to small-  vessel disease.  STUDIES PERFORMED:  1. CT of the brain on admission shows no acute abnormalities.  2. Limited MRI done in the emergency room shows left deep white matter  infarct.  3. Full MRI shows acute left lenticulostriate territory infarct.  4. MRA of the brain is negative.  5. A 2-D echo shows EF of 55-60% with no obvious source of embolus.  6. Carotid Doppler shows no ICA stenosis.  7. Transcranial Doppler performed results pending.  8. EKG shows sinus rhythm with atrial premature complex.   Seen in hospital by Dr. Pearlean Brownie.  Given TPA...tolerated well  Started on aggrenox. No residual focal neuro changes. Has follow up with Dr. Pearlean Brownie in June. Started on Zocor for high cholesterol... Recommended check of chol 4-6 week out. Counseled to stop smoking...  She has noted muscles and joints weak, skin is itchy and occ breaks out.  Headaches much more frequently.  She feels headaches are beter since she ran out of nexium, but reflux is severe.  Needs refill of synthroid for 90 days. She has noted an improvement in fatigue as we have titrated dose up.   Still with some dyspnea.. She has quit smoking.   Review of Systems  Constitutional: Negative for fever and fatigue.  HENT: Positive for congestion, rhinorrhea and postnasal drip. Negative for ear pain.   Eyes: Negative for pain.  Respiratory: Positive for shortness of breath. Negative for chest tightness and wheezing.        Mild, improved some with moking cessation  Cardiovascular: Negative for chest pain, palpitations and leg swelling.  Gastrointestinal: Negative for abdominal pain.  Genitourinary: Negative for dysuria.  Musculoskeletal: Positive for myalgias.  Neurological: Positive for headaches. Negative for weakness and  numbness.       Objective:   Physical Exam  Constitutional: Vital signs are normal. She appears well-developed and well-nourished. She is cooperative.  Non-toxic appearance. She does not appear ill. No distress.  HENT:  Head: Normocephalic.  Right Ear: Hearing, tympanic membrane, external ear and ear canal normal. Tympanic membrane is not erythematous, not retracted and not bulging.  Left Ear: Hearing, tympanic membrane, external ear and ear canal normal. Tympanic membrane is not erythematous, not retracted and not bulging.  Nose: No mucosal edema or rhinorrhea. Right sinus exhibits no maxillary sinus tenderness and no frontal sinus tenderness. Left sinus exhibits no maxillary sinus tenderness and no frontal sinus tenderness.  Mouth/Throat: Uvula is midline, oropharynx is clear and moist and mucous membranes are normal.  Eyes: Conjunctivae, EOM and lids are normal. Pupils are equal, round, and reactive to light. No foreign bodies found.  Neck: Trachea normal and normal range of motion. Neck supple. Carotid bruit is not present. No mass and no thyromegaly present.  Cardiovascular: Normal rate, regular rhythm, S1 normal, S2 normal, normal heart sounds, intact distal pulses and normal pulses.  Exam reveals no gallop and no friction rub.   No murmur heard. Pulmonary/Chest: Effort normal and breath sounds normal. Not tachypneic. No respiratory distress. She has no decreased breath sounds. She has no wheezes. She has no rhonchi. She has no rales.  Abdominal: Soft. Normal appearance and bowel sounds are normal. There is no tenderness.  Neurological: She is alert. She has normal strength and normal reflexes. No cranial nerve deficit or  sensory deficit. She displays a negative Romberg sign.  Skin: Skin is warm, dry and intact. No rash noted.  Psychiatric: Her speech is normal and behavior is normal. Judgment and thought content normal. Her mood appears not anxious. Cognition and memory are normal. She  does not exhibit a depressed mood.          Assessment & Plan:

## 2010-09-29 NOTE — Assessment & Plan Note (Signed)
?   If contributing to headache along with aggrenox. Will try generic protonix to see if different SE and more affordable.

## 2010-10-15 ENCOUNTER — Other Ambulatory Visit: Payer: Self-pay | Admitting: *Deleted

## 2010-10-15 ENCOUNTER — Encounter: Payer: Self-pay | Admitting: Family Medicine

## 2010-10-15 ENCOUNTER — Ambulatory Visit (INDEPENDENT_AMBULATORY_CARE_PROVIDER_SITE_OTHER): Payer: BC Managed Care – PPO | Admitting: Family Medicine

## 2010-10-15 VITALS — BP 130/70 | HR 104 | Temp 98.6°F | Ht 61.0 in | Wt 177.0 lb

## 2010-10-15 DIAGNOSIS — M791 Myalgia, unspecified site: Secondary | ICD-10-CM

## 2010-10-15 DIAGNOSIS — W57XXXA Bitten or stung by nonvenomous insect and other nonvenomous arthropods, initial encounter: Secondary | ICD-10-CM

## 2010-10-15 DIAGNOSIS — IMO0001 Reserved for inherently not codable concepts without codable children: Secondary | ICD-10-CM

## 2010-10-15 MED ORDER — DOXYCYCLINE HYCLATE 100 MG PO TABS: 100.0000 mg | ORAL_TABLET | Freq: Two times a day (BID) | ORAL | Status: AC

## 2010-10-15 MED ORDER — SIMVASTATIN 20 MG PO TABS: 20.0000 mg | ORAL_TABLET | Freq: Every day | ORAL | Status: DC

## 2010-10-15 NOTE — Progress Notes (Signed)
60 year old female:  Got 2 ticks -- very small ones  Has a bad heeadache, joints are hurting a lot, wrists, elbows, muscles are aching. All hurting -- new or different  Patient presents after pulling to ticks off of her body, one on her head, and one on her abdomen  Approximately 5 or 6 days ago. Since that time, they have progressively become more painful at the site, with the one on her LEFT lower abdomen somewhat reddish in appearance surrounding this proximally the area of a quarter. Additionally, since that time she is generally felt unwell, she is had diffuse polyarthralgia, headache, neck pain, he additionally has some diffuse myalgia. Overall, she does not feel well, she feels as if she is had some low-grade fever subjectively.  ROS: She is eating and drinking normally. No diarrhea, vomiting, or any URI or pulmonary complaints. Both ticks were of small variety. No diffuse body rash  The PMH, PSH, Social History, Family History, Medications, and allergies have been reviewed in [facility], and have been updated if relevant.  GEN: WDWN, NAD, Non-toxic, A & O x 3 HEENT: Atraumatic, Normocephalic. Neck supple. No masses, No LAD. Small scab, top of head Ears and Nose: No external deformity. CV: RRR, No M/G/R. No JVD. No thrill. No extra heart sounds. PULM: CTA B, no wheezes, crackles, rhonchi. No retractions. No resp. distress. No accessory muscle use. ABD: S, NT, ND, +BS. No rebound tenderness. No HSM.  Skin: LEFT lower abdomen, small scab, with surrounding redness and somewhat tenderness to palpation approximately the size of a quarter EXTR: No c/c/e NEURO Normal gait.  PSYCH: Normally interactive. Conversant. Not depressed or anxious appearing.  Calm demeanor.   A/P: tick bite, given diffuse symptoms, cannot rule out [facility] spotted fever, ehrlichiosis, or Lyme disease. Most appropriate to treat with a course of doxycycline in this case.  LEFT lower abdomen wound is mildly irritated and  red, she may have some early cellulitis. Doxycycline should cover potential cellulitis as well.

## 2010-10-16 ENCOUNTER — Other Ambulatory Visit: Payer: Self-pay | Admitting: *Deleted

## 2010-10-16 MED ORDER — ASPIRIN-DIPYRIDAMOLE ER 25-200 MG PO CP12: 1.0000 | ORAL_CAPSULE | Freq: Two times a day (BID) | ORAL | Status: DC

## 2010-10-16 NOTE — H&P (Signed)
NAME:  Tiffany Orozco, Tiffany Orozco                        ACCOUNT NO.:  [id number]   MEDICAL RECORD NO.:  [id number]                   PATIENT TYPE:  EMS   LOCATION:  MAJO                                 FACILITY:  [facility]   PHYSICIAN:  Jimmye Norman III, M.D.               DATE OF BIRTH:  1950-09-07   DATE OF ADMISSION:  09/06/2003  DATE OF DISCHARGE:                                HISTORY & PHYSICAL   IDENTIFICATION AND CHIEF COMPLAINT:  The patient is a 60 year old female  with diffuse abdominal pain and changes of cholecystitis on ultrasound.   HISTORY OF PRESENT ILLNESS:  I was asked to see this patient by Dr. Ruthine Dose in  the emergency room because of diffuse abdominal pain.  She had an ultrasound  which demonstrated possible acute cholecystitis with chronic cholecystitis  changes.  Because of this, surgical consultation was obtained.   Her history is one where she has had pain today associated with nausea, dry  heaves.  No fevers or chills, no jaundice.  She came to the emergency room  approximately 10:45 this morning.   PAST MEDICAL HISTORY:  1. Hypothyroidism.  2. Gastroesophageal reflux disease.   MEDICATIONS:  Include Prevacid and Synthroid.   ALLERGIES:  No known drug allergies.   PAST SURGICAL HISTORY:  She had exploration for tubal pregnancy many years  ago and a tubal ligation on the other side.  She had normal pregnancies, no  C sections.  She has also had an appendectomy in the past.   REVIEW OF SYSTEMS:  She has had nausea, dry heaves.  No chest pain,  shortness of breath.  No fevers or chills.  No jaundice.   FAMILY HISTORY:  Unremarkable and noncontributory.   SOCIAL HISTORY:  She is a smoker and smokes about one-half pack a day.  She  does not drink, does not take any other drugs.   PHYSICAL EXAMINATION:  GENERAL:  She appears to be a little bit confused and  somnolent from pain medicine she had recently received for her abdominal  pain.  VITAL SIGNS:  She is  afebrile.  Blood pressure is up at 172/86.  She has a  pulse of 50.  Respirations are 24.  HEENT:  She is normocephalic, atraumatic and anicteric.  NECK:  Supple.  She has no bruits.  CHEST:  Clear.  No rales, rhonchi, or wheezes.  CARDIAC:  Regular rhythm and rate currently.  No murmurs, gallops, rubs, or  heaves.  ABDOMEN:  Mildly distended with hypoactive bowel sounds.  She has some  moderate tenderness on the left side.  No right upper quadrant palpable  masses or tenderness.  No palpable spleen or liver.  RECTAL/PELVIC:  Deferred.  NEUROLOGIC:  Cranial nerves II-XII grossly intact.  EXTREMITIES:  She has good dorsalis pedis pulses bilaterally.  No evidence  of Homan's sign or lower extremity edema.   LABORATORY AND X-RAY DATA:  White count  13,000, hemoglobin 15.9, hematocrit  44.9, platelets normal.  Electrolytes are normal.  LFTs are absolutely  normal as are her lipase and amylase.  UA is noncontributory, does not show  any evidence of UTI.   IMPRESSION:  Diffuse abdominal pain, not really localized to her right upper  quadrant where the ultrasound demonstrates some thickening of the wall  consistent with acute on chronic cholecystitis.  She also has  cholelithiasis.  This may be a red herring, and with her abdominal pain  being away from the gallbladder, I would suggest that she possibly has some  other process going on which we will work up with a CT scan.   Pending the results of that and also possibly a HIDA scan, the patient may  require a cholecystectomy prior to discharge.  Currently we will go ahead  and do the CT scan, admit the patient, start her on Zosyn for likely intra-  abdominal process.                                                Tiffany Orozco, M.D.    JW/MEDQ  D:  09/06/2003  T:  09/07/2003  Job:  [id number]   cc:   Angelena Sole, M.D. [facility]

## 2010-10-16 NOTE — Discharge Summary (Signed)
NAME:  Tiffany Orozco, Tiffany Orozco                        ACCOUNT NO.:  [id number]   MEDICAL RECORD NO.:  [id number]                   PATIENT TYPE:  INP   LOCATION:  5007                                 FACILITY:  [facility]   PHYSICIAN:  Jimmye Norman III, M.D.               DATE OF BIRTH:  [birth year]   DATE OF ADMISSION:  09/06/2003  DATE OF DISCHARGE:  09/09/2003                                 DISCHARGE SUMMARY   DISCHARGE DIAGNOSIS:  Acute cholecystitis.   PRINCIPAL PROCEDURE:  Laparoscopic cholecystectomy.   DISCHARGE MEDICATIONS:  She was discharged to home with pain medicine of  Percocet p.r.n. only.   DISCHARGE DIET:  Regular.   CONDITION:  Stable.   HOSPITAL COURSE:  The patient was admitted on September 06, 2003, with abdominal  pain, and an ultrasound which demonstrated possible acute cholecystitis.  However, because of the unusual nature of her abdominal pain, a CT scan was  done initially to help delineate any other parts of possible cholecystitis  or other intra-abdominal processes.  It was planned to do a HIDA scan as the  CT failed to demonstrate cholecystitis although because her pain pattern did  not exactly correlate with her cholecystitis.  However, the CT scan  confirmed the possible presence of an impacted stone with inflammation.  She  was taken to surgery on the April 10 which demonstrated that she did have  acute cholecystitis.  At time of surgery, a laparoscopic cholecystectomy was  done.  No cholangiogram.  Postoperatively, she did well and was discharged  home the following day after being advanced to a soft diet and tolerating  that well.   DIAGNOSES:  Acute cholecystitis.   FOLLOW-UP:  She has a follow-up to see me in my office in two weeks.                                                Kathrin Ruddy, M.D.    JW/MEDQ  D:  10/10/2003  T:  10/11/2003  Job:  [id number]

## 2010-10-16 NOTE — Op Note (Signed)
NAME:  Tiffany Orozco, Tiffany Orozco                        ACCOUNT NO.:  [id number]   MEDICAL RECORD NO.:  [id number]                   PATIENT TYPE:  INP   LOCATION:  5007                                 FACILITY:  [facility]   PHYSICIAN:  Jimmye Norman III, M.D.               DATE OF BIRTH:  Jan 13, 1951   DATE OF PROCEDURE:  DATE OF DISCHARGE:                                 OPERATIVE REPORT   PREOPERATIVE DIAGNOSIS:  Acute cholecystitis and cholelithiasis.   POSTOPERATIVE DIAGNOSIS:  Acute cholecystitis and cholelithiasis.   PROCEDURE:  Laparoscopic cholecystectomy.   SURGEON:  Jimmye Norman, M.D.   ASSISTANT:  Sheppard Plumber. Earlene Plater, M.D.   ANESTHESIA:  General endotracheal.   ESTIMATED BLOOD LOSS:  Less than 50 mL.   COMPLICATIONS:  None.   CONDITION:  Stable.   INDICATION FOR OPERATION:  The patient is a 61 year old with abdominal pain  and gallstones with pericholecystic fluid on ultrasound and CT scan, who  comes in for a laparoscopic cholecystectomy.   OPERATION:  The patient was taken to the operating room and placed on the  table in supine position.  After an adequate endotracheal anesthetic was  administered, she was prepped and draped in the usual manner exposing the  midline and the right upper quadrant.   A supraumbilical curvilinear incision was made using a #11 blade and taken  down to the midline fascia.  It was through this midline fascia that a  Veress needle was passed into the peritoneal cavity and confirmed to be in  position using the saline test.  Once this was done, carbon dioxide  insufflation was instilled into the peritoneal cavity up to a maximum intra-  abdominal pressure of 15 mmHg.  Once this was done an Optiview cannula was  used to pass a trocar through the supraumbilical fascia into the peritoneal  cavity under direct vision.  With that in place two right costal margin  cannulas and a subxiphoid 11/12 mm cannula were passed under direct vision.  We then placed  the patient in steep reverse Trendelenburg, the left side was  tilted down, and the dissection begun.   The gallbladder was edematous and inflamed.  We grasped it with a ratcheted  grasper through the lateralmost 5 mm cannula and retracted it toward the  anterior abdominal wall and the right upper quadrant.  We then used a second  grasper on the infundibulum and dissected out the cystic duct and the cystic  artery within the peritoneum overlying the triangle of Calot and the  hepatoduodenal triangle.  Once this was done we were able to completely  encircle the cystic duct, form a window on both sides, and then clip it  proximally and distally and transect it.  We then cauterized the cystic  artery, which was small, measuring approximately 2 mm in size.  We dissected  out the gallbladder from its bed with minimal difficulty, although  there was  entrance into the gallbladder with one pass.   Once we had completely detached the gallbladder from the liver bed, we  brought it out through the supraumbilical site using an EndoCatch bag.  We  irrigated the bed with normal saline and then subsequently removed all  cannulas.   We closed the supraumbilical site using a figure-of-eight stitch of 0 Vicryl  and then the skin was closed using a running subcuticular stitch of 4-0  Vicryl.  All needle counts, sponge counts, and instrument counts were  correct at the conclusion of the case.  Sterile dressings were applied.  Marcaine 0.25% was injected at all sites.                                               Kathrin Ruddy, M.D.    JW/MEDQ  D:  09/08/2003  T:  09/09/2003  Job:  [id number]

## 2010-12-18 ENCOUNTER — Ambulatory Visit (INDEPENDENT_AMBULATORY_CARE_PROVIDER_SITE_OTHER): Payer: BC Managed Care – PPO | Admitting: Family Medicine

## 2010-12-18 ENCOUNTER — Encounter: Payer: Self-pay | Admitting: Family Medicine

## 2010-12-18 DIAGNOSIS — F411 Generalized anxiety disorder: Secondary | ICD-10-CM

## 2010-12-18 DIAGNOSIS — R030 Elevated blood-pressure reading, without diagnosis of hypertension: Secondary | ICD-10-CM | POA: Insufficient documentation

## 2010-12-18 MED ORDER — VENLAFAXINE HCL ER 37.5 MG PO CP24: ORAL_CAPSULE | ORAL | Status: DC

## 2010-12-18 NOTE — Assessment & Plan Note (Signed)
In adequate control on wellbutrin.. Also may be causing of her  Headaches.  Will change to effexor, titrate up.

## 2010-12-18 NOTE — Assessment & Plan Note (Signed)
Possibly due to anxiety. Improved now. Continue to follow at home, call if >3 measurements above 140/90. Will start HCTZ if this occurs. Recent labs done for other issues such as thyroid, cbc, CMET etc.

## 2010-12-18 NOTE — Progress Notes (Signed)
  Subjective:    Patient ID: Tiffany Orozco, female    DOB: 10-Mar-1951, 60 y.o.   MRN: [id number]  HPI  60 year old female with history of CVA in [facility] presents with recently elevated BP at home.  She has noted measurements up and down over the last 3 weeks, now in last 5 days in normal range.  Bp when high was 153-160/80s, pulse 80-90  Headaches, fatigue, poor generalized feeling.. Feeling better now.  She had been feeling stress out.Marland Kitchen Preparing for trip, husband stressed about job, lost dog.  Last TSH 07/2010 in nml range. She feels much better on higher dose of thyroid medication.  Depression, anxiety: Has been on wellbutrin since father passed away in 1997/10/19.  She feels like this medication is not helping her very much.        Review of Systems  Constitutional: Negative for fever and fatigue.  HENT: Negative for ear pain.   Eyes: Negative for pain.  Respiratory: Negative for chest tightness and shortness of breath.   Cardiovascular: Negative for chest pain, palpitations and leg swelling.  Gastrointestinal: Negative for abdominal pain.  Genitourinary: Negative for dysuria.       Objective:   Physical Exam  Constitutional: Vital signs are normal. She appears well-developed and well-nourished. She is cooperative.  Non-toxic appearance. She does not appear ill. No distress.  HENT:  Head: Normocephalic.  Right Ear: Hearing, tympanic membrane, external ear and ear canal normal. Tympanic membrane is not erythematous, not retracted and not bulging.  Left Ear: Hearing, tympanic membrane, external ear and ear canal normal. Tympanic membrane is not erythematous, not retracted and not bulging.  Nose: No mucosal edema or rhinorrhea. Right sinus exhibits no maxillary sinus tenderness and no frontal sinus tenderness. Left sinus exhibits no maxillary sinus tenderness and no frontal sinus tenderness.  Mouth/Throat: Uvula is midline, oropharynx is clear and moist and mucous membranes are  normal.  Eyes: Conjunctivae, EOM and lids are normal. Pupils are equal, round, and reactive to light. No foreign bodies found.  Neck: Trachea normal and normal range of motion. Neck supple. Carotid bruit is not present. No mass and no thyromegaly present.  Cardiovascular: Normal rate, regular rhythm, S1 normal, S2 normal, normal heart sounds, intact distal pulses and normal pulses.  Exam reveals no gallop and no friction rub.   No murmur heard. Pulmonary/Chest: Effort normal and breath sounds normal. Not tachypneic. No respiratory distress. She has no decreased breath sounds. She has no wheezes. She has no rhonchi. She has no rales.  Abdominal: Soft. Normal appearance and bowel sounds are normal. There is no tenderness.  Neurological: She is alert.  Skin: Skin is warm, dry and intact. No rash noted.  Psychiatric: She has a normal mood and affect. Her speech is normal and behavior is normal. Judgment and thought content normal. Her mood appears not anxious. Cognition and memory are normal. She does not exhibit a depressed mood.          Assessment & Plan:

## 2010-12-18 NOTE — Patient Instructions (Addendum)
Follow BP at home, call if greater than 140/90 x 3 or more.  Stop wellbutrin and start 37.5 mg daily venlafaxine. Increase after 1 week.  Follow up as scheuled for physical in 03/2011, earlier if needed.

## 2010-12-30 ENCOUNTER — Ambulatory Visit (HOSPITAL_BASED_OUTPATIENT_CLINIC_OR_DEPARTMENT_OTHER)
Admission: RE | Admit: 2010-12-30 | Discharge: 2010-12-30 | Disposition: A | Discharge status: Home / Self Care | Payer: BC Managed Care – PPO | Source: Ambulatory Visit | Attending: Orthopedic Surgery | Admitting: Orthopedic Surgery

## 2010-12-30 DIAGNOSIS — M65849 Other synovitis and tenosynovitis, unspecified hand: Secondary | ICD-10-CM | POA: Insufficient documentation

## 2010-12-30 DIAGNOSIS — M65839 Other synovitis and tenosynovitis, unspecified forearm: Secondary | ICD-10-CM | POA: Insufficient documentation

## 2010-12-30 DIAGNOSIS — Z01812 Encounter for preprocedural laboratory examination: Secondary | ICD-10-CM | POA: Insufficient documentation

## 2010-12-30 DIAGNOSIS — E669 Obesity, unspecified: Secondary | ICD-10-CM | POA: Insufficient documentation

## 2010-12-30 DIAGNOSIS — M6530 Trigger finger, unspecified finger: Secondary | ICD-10-CM | POA: Insufficient documentation

## 2010-12-30 LAB — POCT HEMOGLOBIN-HEMACUE: Hemoglobin: 14.5 g/dL (ref 12.0–15.0)

## 2011-01-19 ENCOUNTER — Telehealth: Payer: Self-pay | Admitting: *Deleted

## 2011-01-19 MED ORDER — HYDROCHLOROTHIAZIDE 25 MG PO TABS: 25.0000 mg | ORAL_TABLET | Freq: Every day | ORAL | Status: DC

## 2011-01-19 MED ORDER — VENLAFAXINE HCL ER 75 MG PO CP24: 75.0000 mg | ORAL_CAPSULE | Freq: Every day | ORAL | Status: DC

## 2011-01-19 NOTE — Telephone Encounter (Signed)
Rx's sent in ... Follow BPs at home and call if BP not less than 140/90.

## 2011-01-19 NOTE — Telephone Encounter (Signed)
Pt states her BP has been running high and you had told her that you would need to put her on a new medicine if that happened.  Her BP runs around 168/78.  Also, she has been taking 2 effexor 37.5 mg's at a time and you had told her that you would double the dose to 75 mg's, so that she will only have to take one.  [facility].

## 2011-01-20 MED ORDER — VENLAFAXINE HCL ER 75 MG PO CP24: 75.0000 mg | ORAL_CAPSULE | Freq: Every day | ORAL | Status: DC

## 2011-01-20 MED ORDER — HYDROCHLOROTHIAZIDE 25 MG PO TABS: 25.0000 mg | ORAL_TABLET | Freq: Every day | ORAL | Status: DC

## 2011-01-20 NOTE — Telephone Encounter (Signed)
Patient advised and rx's changed to 90 days b/c of insurance

## 2011-01-28 ENCOUNTER — Telehealth: Payer: Self-pay | Admitting: *Deleted

## 2011-01-28 DIAGNOSIS — I10 Essential (primary) hypertension: Secondary | ICD-10-CM

## 2011-01-28 NOTE — Telephone Encounter (Signed)
Patient called to let you know that the HCTZ that she was given recently is making her sick to her stomach, causing her a headache and blurred vision at times. Patient wants to know what should she do? Pharmacy-[facility]

## 2011-01-29 MED ORDER — LISINOPRIL 10 MG PO TABS: 10.0000 mg | ORAL_TABLET | Freq: Every day | ORAL | Status: DC

## 2011-01-29 NOTE — Telephone Encounter (Signed)
Patient advised and appt made

## 2011-01-29 NOTE — Telephone Encounter (Signed)
Stop HCTZ. Start lisinopril, follow BPs at home call if not <140/90.\ Have her make an appointment to have cr checked in 7-10 days after starting the medicaiton.

## 2011-02-05 ENCOUNTER — Other Ambulatory Visit: Payer: BC Managed Care – PPO

## 2011-02-18 ENCOUNTER — Telehealth: Payer: Self-pay | Admitting: *Deleted

## 2011-02-18 ENCOUNTER — Ambulatory Visit (INDEPENDENT_AMBULATORY_CARE_PROVIDER_SITE_OTHER): Payer: BC Managed Care – PPO | Admitting: Family Medicine

## 2011-02-18 ENCOUNTER — Encounter: Payer: Self-pay | Admitting: Family Medicine

## 2011-02-18 VITALS — BP 130/78 | HR 81 | Temp 98.4°F | Ht 60.0 in | Wt 173.4 lb

## 2011-02-18 DIAGNOSIS — J329 Chronic sinusitis, unspecified: Secondary | ICD-10-CM

## 2011-02-18 DIAGNOSIS — Z23 Encounter for immunization: Secondary | ICD-10-CM

## 2011-02-18 DIAGNOSIS — J019 Acute sinusitis, unspecified: Secondary | ICD-10-CM | POA: Insufficient documentation

## 2011-02-18 DIAGNOSIS — J0100 Acute maxillary sinusitis, unspecified: Secondary | ICD-10-CM | POA: Insufficient documentation

## 2011-02-18 DIAGNOSIS — F3289 Other specified depressive episodes: Secondary | ICD-10-CM

## 2011-02-18 DIAGNOSIS — F329 Major depressive disorder, single episode, unspecified: Secondary | ICD-10-CM

## 2011-02-18 MED ORDER — VENLAFAXINE HCL ER 37.5 MG PO CP24: 37.5000 mg | ORAL_CAPSULE | Freq: Every day | ORAL | Status: DC

## 2011-02-18 MED ORDER — AMOXICILLIN 500 MG PO CAPS: 1000.0000 mg | ORAL_CAPSULE | Freq: Two times a day (BID) | ORAL | Status: AC

## 2011-02-18 NOTE — Telephone Encounter (Signed)
Pt was told this morning that an antibiotic would [facility] this morning, but it was not.  Please send to [facility].

## 2011-02-18 NOTE — Telephone Encounter (Signed)
Patient advised via message on machine and antibiotics to [facility]

## 2011-02-18 NOTE — Patient Instructions (Signed)
Try lower dose of effexor for mood. Call if SE remain. Follow BPs at home, call if greater than 140/90. Start mucinex daily.  Start antibiotic to cover sinus infection. Add nasal saline spray 2-3 times a day.  Call if not improving in 4-5 days.

## 2011-02-18 NOTE — Progress Notes (Signed)
Addended by: Kerby Nora E on: 02/18/2011 03:51 PM   Modules accepted: Orders

## 2011-02-18 NOTE — Assessment & Plan Note (Signed)
Effexor controlled symptoms.. But had SE at higher dose... Will return to lower dose.

## 2011-02-18 NOTE — Progress Notes (Signed)
  Subjective:    Patient ID: Tiffany Orozco, female    DOB: 09-05-1950, 60 y.o.   MRN: [id number]  HPI  60 year old female notes pounding in left ear, 3 weeks ago. No drainage from ear.  Mild ache. Having poor control of allergies... Nasal congestion, face pain and ST, cough.Marland Kitchen 2-3 weeks. Restarted zyrtec 5 days ago. Taking flonase 2 sprays per nostril daily. Low grade temp in past few days. Mild SOB with coughing.. Cannot rest at night.     Depression, continued poor control: SE (high BP, ill feeling, nausea, lightheaded) on effexor 75 mg. She did not note SE on lower dose of med before increased it up.  Helped mood, had weight loss.  Interested in trying a different medication. Continued life stress.   Review of Systems  Constitutional: Negative for fever and fatigue.  HENT: Positive for ear pain, congestion and rhinorrhea.   Eyes: Negative for pain.  Respiratory: Negative for chest tightness and shortness of breath.   Cardiovascular: Negative for chest pain, palpitations and leg swelling.  Gastrointestinal: Negative for abdominal pain.  Genitourinary: Negative for dysuria.       Objective:   Physical Exam  Constitutional: Vital signs are normal. She appears well-developed and well-nourished. She is cooperative.  Non-toxic appearance. She does not appear ill. No distress.  HENT:  Head: Normocephalic.  Right Ear: Hearing, external ear and ear canal normal. Tympanic membrane is not erythematous, not retracted and not bulging. A middle ear effusion is present.  Left Ear: Hearing, external ear and ear canal normal. Tympanic membrane is not erythematous, not retracted and not bulging. A middle ear effusion is present.  Nose: No mucosal edema or rhinorrhea. Right sinus exhibits maxillary sinus tenderness. Right sinus exhibits no frontal sinus tenderness. Left sinus exhibits maxillary sinus tenderness. Left sinus exhibits no frontal sinus tenderness.  Mouth/Throat: Uvula is  midline, oropharynx is clear and moist and mucous membranes are normal.  Eyes: Conjunctivae, EOM and lids are normal. Pupils are equal, round, and reactive to light. No foreign bodies found.  Neck: Trachea normal and normal range of motion. Neck supple. Carotid bruit is not present. No mass and no thyromegaly present.  Cardiovascular: Normal rate, regular rhythm, S1 normal, S2 normal, normal heart sounds, intact distal pulses and normal pulses.  Exam reveals no gallop and no friction rub.   No murmur heard. Pulmonary/Chest: Effort normal and breath sounds normal. Not tachypneic. No respiratory distress. She has no decreased breath sounds. She has no wheezes. She has no rhonchi. She has no rales.  Abdominal: Soft. Normal appearance and bowel sounds are normal. There is no tenderness.  Neurological: She is alert.  Skin: Skin is warm, dry and intact. No rash noted.  Psychiatric: Her speech is normal and behavior is normal. Judgment and thought content normal. Her mood appears not anxious. Cognition and memory are normal. She does not exhibit a depressed mood.          Assessment & Plan:

## 2011-02-18 NOTE — Assessment & Plan Note (Signed)
Given length of time with symptoms... Treat with antibiotics. Start nasal irrigation, mucinex. Follow up if not improving.

## 2011-02-26 ENCOUNTER — Other Ambulatory Visit: Payer: Self-pay | Admitting: Family Medicine

## 2011-03-02 ENCOUNTER — Encounter: Payer: Self-pay | Admitting: Family Medicine

## 2011-03-02 ENCOUNTER — Ambulatory Visit (INDEPENDENT_AMBULATORY_CARE_PROVIDER_SITE_OTHER): Payer: BC Managed Care – PPO | Admitting: Family Medicine

## 2011-03-02 DIAGNOSIS — J019 Acute sinusitis, unspecified: Secondary | ICD-10-CM

## 2011-03-02 MED ORDER — CETIRIZINE HCL 10 MG PO TABS: 10.0000 mg | ORAL_TABLET | Freq: Every day | ORAL | Status: DC

## 2011-03-02 MED ORDER — AZITHROMYCIN 250 MG PO TABS: ORAL_TABLET | ORAL | Status: AC

## 2011-03-02 NOTE — Op Note (Signed)
  NAME:  Tiffany Orozco, Tiffany Orozco NO.:  [id number]  MEDICAL RECORD NO.:  [id number]  LOCATION:                                 FACILITY:  PHYSICIAN:  Cindee Salt, M.D.       DATE OF BIRTH:  January 13, 1951  DATE OF PROCEDURE:  12/30/2010 DATE OF DISCHARGE:                              OPERATIVE REPORT   PREOPERATIVE DIAGNOSIS:  Stenosing tenosynovitis right thumb.  POSTOPERATIVE DIAGNOSIS:  Stenosing tenosynovitis right thumb.  OPERATION:  Release A1 pulley right thumb.  SURGEON:  Cindee Salt, MD  ANESTHESIA:  Forearm based IV regional.  DATE OF OPERATION:  December 30, 2010  ANESTHESIOLOGIST:  Sheldon Silvan, MD  HISTORY:  The patient is a 60 year old female with a history of triggering of her right thumb.  This did not responded to conservative treatment.  She has elected to undergo surgical release of the A1 pulley.  Peri, peri, and postoperative course have been discussed along with risks and complications.  She is aware that there is no guarantee with the surgery, possibility of infection, recurrence injury to arteries, nerves, tendons, incomplete relief of symptoms, dystrophy. Preoperative area, the patient is seen.  The extremity was marked by both the patient and surgeon.  Antibiotic was given.  PROCEDURE:  The patient was brought to the operating room where a forearm based IV regional anesthetic was carried out without difficulty. She was prepped using ChloraPrep, supine position, right arm free a 3- minute dry time was allowed.  Time-out taken confirming the patient procedure.  A transverse incision was made over the A1 pulley of the right thumb, carried down through subcutaneous tissue.  Neurovascular structures were identified and protected.  The A1 pulley was found to be markedly thickened, approximately 2 mm in thickness.  This was released on its radial aspect protecting the radial digital artery nerve.  The thumb immediately was placed through full range  motion, no further triggering was noted.  The wound was copiously irrigated with saline. The skin was closed with interrupted 4-0 Vicryl Rapide sutures.  A sterile compressive dressing was applied.  No splint was applied.  A local infiltration with 0.25% Marcaine without epinephrine was given approximately 5 mL was used.  On deflation of the tourniquet all fingers immediately pinked, after placement of a compressive dressing, she was taken to the recovery room for observation in satisfactory condition.  She will be discharged home to return to the [facility] [place] [facility] in 1 week, on Vicodin.          ______________________________ Cindee Salt, M.D.     GK/MEDQ  D:  12/30/2010  T:  12/30/2010  Job:  [id number]  cc:   Kerby Nora, MD  Electronically Signed by Cindee Salt M.D. on 03/02/2011 04:37:05 PM

## 2011-03-02 NOTE — Progress Notes (Signed)
Seen 02/18/11, started on amoxil. She had max sinus tenderness.  She was getting some better- the ear fluid feels like it's better now. Using mucinex and nasal sprays. The facial pain is still present- she was starting to feel better with this but then the sx got worse.    Chest feels tight with cough and occ sputum.  Smoking about 1/2 ppd.  "I'm quitting soon."  Husband is out of work in the meantime and her car died on the way over here today.  She has some mild external vaginal irritation after starting the amoxil.  Occ diarrhea, but this predates the amoxil.   Meds, vitals, and allergies reviewed.   ROS: See HPI.  Otherwise, noncontributory.  GEN: nad, alert and oriented HEENT: mucous membranes moist, tm w/o erythema but SOM noted x2, nasal exam w/o erythema, clear discharge noted,  OP with cobblestoning NECK: supple w/o LA CV: rrr.   PULM: ctab, no inc wob, occ dry cough SKIN: no acute rash Max sinus ttp x2.

## 2011-03-02 NOTE — Patient Instructions (Signed)
Drink plenty of fluids, take tylenol as needed, and gargle with warm salt water for your throat if needed.  Start the antibiotics and use the nasal sprays.  This should gradually improve.  Take care.  Let us know if you have other concerns.

## 2011-03-03 ENCOUNTER — Encounter: Payer: Self-pay | Admitting: Family Medicine

## 2011-03-03 NOTE — Assessment & Plan Note (Signed)
Appears partially treated.  Talked to pt about stopping smoking, use zmax and nasal sprays/saline.  F/u prn.  Nontoxic, she agrees.

## 2011-03-05 ENCOUNTER — Ambulatory Visit: Payer: BC Managed Care – PPO | Admitting: Family Medicine

## 2011-03-08 ENCOUNTER — Other Ambulatory Visit: Payer: Self-pay | Admitting: Family Medicine

## 2011-03-08 NOTE — Telephone Encounter (Signed)
I don't believe the pt is taking the med requested... Per last note I see.Marland Kitchen She was to try lower dose wellbutrin. Verify this is right pt ... If so call pt about clarifying refill request please.

## 2011-03-08 NOTE — Telephone Encounter (Signed)
noted 

## 2011-03-08 NOTE — Telephone Encounter (Signed)
Pt called back, states she no longer takes this medicine.  She will call the pharmacy and ask them not to send anymore requests for it.

## 2011-03-08 NOTE — Telephone Encounter (Signed)
Left message for patient to return call.

## 2011-03-19 ENCOUNTER — Ambulatory Visit (INDEPENDENT_AMBULATORY_CARE_PROVIDER_SITE_OTHER): Payer: BC Managed Care – PPO | Admitting: Family Medicine

## 2011-03-19 ENCOUNTER — Encounter: Payer: Self-pay | Admitting: Family Medicine

## 2011-03-19 ENCOUNTER — Emergency Department (HOSPITAL_COMMUNITY): Payer: BC Managed Care – PPO

## 2011-03-19 ENCOUNTER — Observation Stay (HOSPITAL_COMMUNITY)
Admission: EM | Admit: 2011-03-19 | Discharge: 2011-03-20 | Disposition: A | Discharge status: Home / Self Care | Payer: BC Managed Care – PPO | Attending: Emergency Medicine | Admitting: Emergency Medicine

## 2011-03-19 DIAGNOSIS — N12 Tubulo-interstitial nephritis, not specified as acute or chronic: Principal | ICD-10-CM | POA: Insufficient documentation

## 2011-03-19 DIAGNOSIS — R142 Eructation: Secondary | ICD-10-CM

## 2011-03-19 DIAGNOSIS — R0602 Shortness of breath: Secondary | ICD-10-CM | POA: Insufficient documentation

## 2011-03-19 DIAGNOSIS — R141 Gas pain: Secondary | ICD-10-CM

## 2011-03-19 DIAGNOSIS — R109 Unspecified abdominal pain: Secondary | ICD-10-CM | POA: Insufficient documentation

## 2011-03-19 DIAGNOSIS — J449 Chronic obstructive pulmonary disease, unspecified: Secondary | ICD-10-CM | POA: Insufficient documentation

## 2011-03-19 DIAGNOSIS — K219 Gastro-esophageal reflux disease without esophagitis: Secondary | ICD-10-CM | POA: Insufficient documentation

## 2011-03-19 DIAGNOSIS — R319 Hematuria, unspecified: Secondary | ICD-10-CM | POA: Insufficient documentation

## 2011-03-19 DIAGNOSIS — R140 Abdominal distension (gaseous): Secondary | ICD-10-CM | POA: Insufficient documentation

## 2011-03-19 DIAGNOSIS — R112 Nausea with vomiting, unspecified: Secondary | ICD-10-CM | POA: Insufficient documentation

## 2011-03-19 DIAGNOSIS — J4489 Other specified chronic obstructive pulmonary disease: Secondary | ICD-10-CM | POA: Insufficient documentation

## 2011-03-19 LAB — DIFFERENTIAL
Basophils Absolute: 0 10*3/uL (ref 0.0–0.1)
Basophils Relative: 0 % (ref 0–1)
Eosinophils Absolute: 0 10*3/uL (ref 0.0–0.7)
Eosinophils Relative: 0 % (ref 0–5)
Lymphocytes Relative: 5 % — ABNORMAL LOW (ref 12–46)
Lymphs Abs: 1.3 10*3/uL (ref 0.7–4.0)
Monocytes Absolute: 2.1 10*3/uL — ABNORMAL HIGH (ref 0.1–1.0)
Monocytes Relative: 8 % (ref 3–12)
Neutro Abs: 23.3 10*3/uL — ABNORMAL HIGH (ref 1.7–7.7)
Neutrophils Relative %: 87 % — ABNORMAL HIGH (ref 43–77)

## 2011-03-19 LAB — POCT URINALYSIS DIPSTICK
Glucose, UA: NEGATIVE
Ketones, UA: NEGATIVE
Nitrite, UA: POSITIVE
Protein, UA: 2000
Spec Grav, UA: 1.02
Urobilinogen, UA: 0.2
pH, UA: 6.5

## 2011-03-19 LAB — URINALYSIS, ROUTINE W REFLEX MICROSCOPIC
Glucose, UA: NEGATIVE mg/dL
Ketones, ur: 15 mg/dL — AB
Nitrite: POSITIVE — AB
Protein, ur: 100 mg/dL — AB
Specific Gravity, Urine: 1.019 (ref 1.005–1.030)
Urobilinogen, UA: 1 mg/dL (ref 0.0–1.0)
pH: 5.5 (ref 5.0–8.0)

## 2011-03-19 LAB — BASIC METABOLIC PANEL
BUN: 18 mg/dL (ref 6–23)
CO2: 21 mEq/L (ref 19–32)
Calcium: 9.6 mg/dL (ref 8.4–10.5)
Chloride: 92 mEq/L — ABNORMAL LOW (ref 96–112)
Creatinine, Ser: 1.1 mg/dL (ref 0.50–1.10)
GFR calc Af Amer: 61 mL/min — ABNORMAL LOW (ref >90)
GFR calc non Af Amer: 53 mL/min — ABNORMAL LOW (ref >90)
Glucose, Bld: 99 mg/dL (ref 70–99)
Potassium: 4 mEq/L (ref 3.5–5.1)
Sodium: 129 mEq/L — ABNORMAL LOW (ref 135–145)

## 2011-03-19 LAB — URINE MICROSCOPIC-ADD ON

## 2011-03-19 LAB — POCT I-STAT TROPONIN I: Troponin i, poc: 0.06 ng/mL (ref 0.00–0.08)

## 2011-03-19 LAB — CBC
HCT: 38.6 % (ref 36.0–46.0)
Hemoglobin: 13.2 g/dL (ref 12.0–15.0)
MCH: 29.1 pg (ref 26.0–34.0)
MCHC: 34.2 g/dL (ref 30.0–36.0)
MCV: 85.2 fL (ref 78.0–100.0)
Platelets: 437 10*3/uL — ABNORMAL HIGH (ref 150–400)
RBC: 4.53 MIL/uL (ref 3.87–5.11)
RDW: 14.4 % (ref 11.5–15.5)
WBC: 26.7 10*3/uL — ABNORMAL HIGH (ref 4.0–10.5)

## 2011-03-19 MED ORDER — CIPROFLOXACIN HCL 500 MG PO TABS: 500.0000 mg | ORAL_TABLET | Freq: Two times a day (BID) | ORAL | Status: DC

## 2011-03-19 NOTE — Assessment & Plan Note (Signed)
Concern for urinary retention, will send to ER.

## 2011-03-19 NOTE — Patient Instructions (Addendum)
I'm worried about kidney infection and other kidney damage.  Go to ER to be further evaluated.

## 2011-03-19 NOTE — Progress Notes (Signed)
Subjective:    Patient ID: Tiffany Orozco, female    DOB: Jun 03, 1950, 60 y.o.   MRN: [id number]  HPI CC: feeling ill  Feeling ill since stroke 07/2010.  Last few days feeling more abdominal bloating.  Yesterday noticed blood in urine, especially with wiping after voiding.  Yesterday more bloody in commode.  Today acutely worsened, bloating in abdomen, inability to fully void leading to worsening pressure.  Notices pulse high for last several months, last few weeks even higher.  Low grade fevers to 100.6 last few days.    + chills.  Feeling nauseated.  + diarrhea for 1 month as well.  + coughing worse recently.  Feeling tight breathing.  + SOB for last few months.  More fatigued as well.  + HA and some dizziness described as lightheadedness, positional orthostatic.  No vomiting.  Smoking 1/2 ppd.  Recently treated with amox for sinusitis, then zmax in last month.  Medications and allergies reviewed and updated in chart.  Past histories reviewed and updated if relevant as below. Patient Active Problem List  Diagnoses  . HYPOTHYROIDISM  . HYPERCHOLESTEROLEMIA  . ANXIETY  . DEPRESSION  . UNSPECIFIED CARDIAC DYSRHYTHMIA  . ALLERGIC RHINITIS  . CHRONIC OBSTRUCTIVE PULMONARY DISEASE, ACUTE EXACERBATION  . COPD  . GERD  . CONSTIPATION, CHRONIC  . BACK PAIN, LUMBAR  . OSTEOPENIA  . MEMORY LOSS  . MURMUR  . POSTMENOPAUSAL STATUS  . TRIGGER FINGER, RIGHT THUMB  . CVA (cerebrovascular accident)  . Myalgia  . Elevated blood pressure reading without diagnosis of hypertension  . Acute sinusitis   Past Medical History  Diagnosis Date  . Anxiety   . Depression   . GERD (gastroesophageal reflux disease)   . Unspecified hypothyroidism   . Skin cancer of face   . Skin cancer of arm   . Allergy   . COPD (chronic obstructive pulmonary disease)   . Hyperlipidemia   . Hypertension   . Obesity   . DDD (degenerative disc disease), lumbar     mild  . Stroke 07/2010   Past Surgical  History  Procedure Date  . Tubal ligation 1975  . Appendectomy   . Foot surgery 03/31/2009    R foot  . Emgs 7/04    atms and wrists neg  . Cholecystectomy 2004   History  Substance Use Topics  . Smoking status: Current Everyday Smoker -- 0.5 packs/day  . Smokeless tobacco: Not on file  . Alcohol Use: Not on file   Family History  Problem Relation Age of Onset  . Skin cancer Father   . Hypertension Mother    Allergies  Allergen Reactions  . Niacin     REACTION: dizziness   Current Outpatient Prescriptions on File Prior to Visit  Medication Sig Dispense Refill  . cetirizine (ZYRTEC) 10 MG tablet Take 1 tablet (10 mg total) by mouth daily.  90 tablet  3  . Cholecalciferol (VITAMIN D) 1000 UNITS capsule Take 1,000 Units by mouth daily. Take 2 (1000 unit) caps by mouth daily       . co-enzyme Q-10 30 MG capsule Take 30 mg by mouth daily.        Marland Kitchen dipyridamole-aspirin (AGGRENOX) 25-200 MG per 12 hr capsule Take 1 capsule by mouth 2 (two) times daily.  180 capsule  1  . fluticasone (FLONASE) 50 MCG/ACT nasal spray 2 sprays by Nasal route daily.  3 Act  3  . levothyroxine (SYNTHROID) 125 MCG tablet Take 1 tablet (125  mcg total) by mouth daily.  90 tablet  3  . lisinopril (PRINIVIL,ZESTRIL) 10 MG tablet Take 1 tablet (10 mg total) by mouth daily.  30 tablet  11  . pantoprazole (PROTONIX) 40 MG tablet TAKE 1 TABLET BY MOUTH ONCE A DAY  90 tablet  0  . simvastatin (ZOCOR) 20 MG tablet Take 1 tablet (20 mg total) by mouth daily.  90 tablet  1  . vitamin E 400 UNIT capsule Take 400 Units by mouth daily.        . Aspirin-Caffeine (BC FAST PAIN RELIEF) 845-65 MG PACK Take 1 packet by mouth as needed.        . cyclobenzaprine (FLEXERIL) 10 MG tablet Take 10 mg by mouth 3 (three) times daily as needed. Muscle spasms, sedation precautions       . diclofenac (VOLTAREN) 75 MG EC tablet Take 75 mg by mouth. Take one by mouth two times a day       . Omega-3 Fatty Acids (FISH OIL) 1000 MG CAPS Take  by mouth. 3 by mouth daily       . venlafaxine (EFFEXOR-XR) 37.5 MG 24 hr capsule Take 1 capsule (37.5 mg total) by mouth daily.  90 capsule  3   Review of Systems Per HPI    Objective:   Physical Exam  Nursing note and vitals reviewed. Constitutional: She appears well-developed and well-nourished. No distress.  HENT:  Head: Normocephalic and atraumatic.  Right Ear: Hearing, tympanic membrane, external ear and ear canal normal.  Left Ear: Hearing, tympanic membrane, external ear and ear canal normal.  Nose: Nose normal. No mucosal edema or rhinorrhea.  Mouth/Throat: Uvula is midline, oropharynx is clear and moist and mucous membranes are normal. No oropharyngeal exudate, posterior oropharyngeal edema, posterior oropharyngeal erythema or tonsillar abscesses.  Eyes: Conjunctivae and EOM are normal. Pupils are equal, round, and reactive to light. No scleral icterus.  Neck: Normal range of motion. Neck supple. No JVD present.  Cardiovascular: Normal rate, regular rhythm, normal heart sounds and intact distal pulses.   No murmur heard. Pulmonary/Chest: Effort normal and breath sounds normal. No respiratory distress. She has no wheezes. She has no rales.       Bibasilar crackles  Abdominal: Soft. Normal appearance and bowel sounds are normal. There is no hepatosplenomegaly. There is tenderness in the right lower quadrant and left lower quadrant. There is CVA tenderness. There is no rigidity, no rebound, no guarding, no tenderness at McBurney's point and negative Murphy's sign.  Musculoskeletal: She exhibits no edema.  Lymphadenopathy:    She has no cervical adenopathy.  Skin: Skin is warm and dry. No rash noted.  Psychiatric: She has a normal mood and affect.      Assessment & Plan:

## 2011-03-19 NOTE — Assessment & Plan Note (Signed)
Concern for pyelo along with active sediment. Given nausea, endorses inability to keep anything down, concern for urinary retention, I feel pt will be better served at ER.   May need IV abx, eval for ARF. Called and spoke with ER secretary to notify sending over.

## 2011-03-21 LAB — URINE CULTURE
Colony Count: >=100000
Culture  Setup Time: 201210191720

## 2011-03-22 ENCOUNTER — Other Ambulatory Visit (INDEPENDENT_AMBULATORY_CARE_PROVIDER_SITE_OTHER): Payer: BC Managed Care – PPO

## 2011-03-22 ENCOUNTER — Other Ambulatory Visit: Payer: Self-pay | Admitting: Family Medicine

## 2011-03-22 DIAGNOSIS — I10 Essential (primary) hypertension: Secondary | ICD-10-CM

## 2011-03-22 LAB — URINE CULTURE: Colony Count: >=100000

## 2011-03-22 LAB — BASIC METABOLIC PANEL
BUN: 10 mg/dL (ref 6–23)
CO2: 25 mEq/L (ref 19–32)
Calcium: 8.7 mg/dL (ref 8.4–10.5)
Chloride: 93 mEq/L — ABNORMAL LOW (ref 96–112)
Creatinine, Ser: 1 mg/dL (ref 0.4–1.2)
GFR: 60.32 mL/min (ref >60.00)
Glucose, Bld: 101 mg/dL — ABNORMAL HIGH (ref 70–99)
Potassium: 4.2 mEq/L (ref 3.5–5.1)
Sodium: 128 mEq/L — ABNORMAL LOW (ref 135–145)

## 2011-03-22 NOTE — Telephone Encounter (Signed)
Please check with pt what abx she was placed on when sent home from ER for pyelonephritis - if bactrim, culture returned resistant to this and she will need switch to cipro.  (placed cipro order in chart.)

## 2011-03-22 NOTE — Telephone Encounter (Signed)
Saw pt in office today in passing - taking cipro 500mg  bid x 10 days.  On correct abx.  States has appt with PCP Friday.  Advised if fever >101, or not improving each day, to return sooner for f/u.  Will route to PCP for fyi.

## 2011-03-23 ENCOUNTER — Encounter: Payer: Self-pay | Admitting: Family Medicine

## 2011-03-23 ENCOUNTER — Other Ambulatory Visit: Payer: Self-pay | Admitting: *Deleted

## 2011-03-23 ENCOUNTER — Ambulatory Visit (INDEPENDENT_AMBULATORY_CARE_PROVIDER_SITE_OTHER): Payer: BC Managed Care – PPO | Admitting: Family Medicine

## 2011-03-23 VITALS — BP 150/80 | HR 96 | Temp 98.6°F | Wt 174.8 lb

## 2011-03-23 DIAGNOSIS — I517 Cardiomegaly: Secondary | ICD-10-CM

## 2011-03-23 DIAGNOSIS — N12 Tubulo-interstitial nephritis, not specified as acute or chronic: Secondary | ICD-10-CM | POA: Insufficient documentation

## 2011-03-23 DIAGNOSIS — R0602 Shortness of breath: Secondary | ICD-10-CM

## 2011-03-23 DIAGNOSIS — R0600 Dyspnea, unspecified: Secondary | ICD-10-CM | POA: Insufficient documentation

## 2011-03-23 DIAGNOSIS — R0989 Other specified symptoms and signs involving the circulatory and respiratory systems: Secondary | ICD-10-CM

## 2011-03-23 DIAGNOSIS — R112 Nausea with vomiting, unspecified: Secondary | ICD-10-CM

## 2011-03-23 LAB — COMPREHENSIVE METABOLIC PANEL
ALT: 35 U/L (ref 0–35)
AST: 31 U/L (ref 0–37)
Albumin: 3.6 g/dL (ref 3.5–5.2)
Alkaline Phosphatase: 137 U/L — ABNORMAL HIGH (ref 39–117)
BUN: 10 mg/dL (ref 6–23)
CO2: 25 mEq/L (ref 19–32)
Calcium: 8.8 mg/dL (ref 8.4–10.5)
Chloride: 95 mEq/L — ABNORMAL LOW (ref 96–112)
Creatinine, Ser: 1 mg/dL (ref 0.4–1.2)
GFR: 61.03 mL/min (ref >60.00)
Glucose, Bld: 103 mg/dL — ABNORMAL HIGH (ref 70–99)
Potassium: 4 mEq/L (ref 3.5–5.1)
Sodium: 130 mEq/L — ABNORMAL LOW (ref 135–145)
Total Bilirubin: 0.4 mg/dL (ref 0.3–1.2)
Total Protein: 7.7 g/dL (ref 6.0–8.3)

## 2011-03-23 LAB — BRAIN NATRIURETIC PEPTIDE: Pro B Natriuretic peptide (BNP): 17 pg/mL (ref 0.0–100.0)

## 2011-03-23 MED ORDER — ALBUTEROL 90 MCG/ACT IN AERS: 2.0000 | INHALATION_SPRAY | Freq: Four times a day (QID) | RESPIRATORY_TRACT | Status: DC | PRN

## 2011-03-23 MED ORDER — PROMETHAZINE HCL 25 MG/ML IJ SOLN
25.0000 mg | Freq: Once | INTRAMUSCULAR | Status: AC
  Administered 2011-03-23: 25 mg via INTRAMUSCULAR

## 2011-03-23 MED ORDER — ALBUTEROL SULFATE (2.5 MG/3ML) 0.083% IN NEBU
2.5000 mg | INHALATION_SOLUTION | Freq: Once | RESPIRATORY_TRACT | Status: AC
  Administered 2011-03-23: 2.5 mg via RESPIRATORY_TRACT

## 2011-03-23 MED ORDER — ONDANSETRON HCL 4 MG PO TABS: 4.0000 mg | ORAL_TABLET | Freq: Every day | ORAL | Status: DC | PRN

## 2011-03-23 MED ORDER — IPRATROPIUM BROMIDE 0.02 % IN SOLN
0.5000 mg | Freq: Once | RESPIRATORY_TRACT | Status: AC
  Administered 2011-03-23: 0.5 mg via RESPIRATORY_TRACT

## 2011-03-23 MED ORDER — ONDANSETRON HCL 4 MG PO TABS: 4.0000 mg | ORAL_TABLET | Freq: Three times a day (TID) | ORAL | Status: DC | PRN

## 2011-03-23 NOTE — Progress Notes (Signed)
  Subjective:    Patient ID: Tiffany Orozco, female    DOB: [birth year], 60 y.o.   MRN: [id number]  HPI CC: f/u ER  Seen here 03/19/2011 with concern for pyelo, endorsed SOB and nausea, unable to keep anything down, sent to ER.  Reviewed records from ER.  Dx with R pyelo by CT scan, although L CVA pain >R.  WBC 26.7.  Given shot of rocephin, zofran, treated with cipro 500mg  bid for 10 day course outpt.  Sent home.  Compliance with abx.  Given tramadol as well for pain.  Does help some.  Has not been taking effexor 2/2 concern with cipro interaction.  No vomiting.  Temp 102 last night.  Voiding ok.  Urine light yellow now.  Staying nauseated which is main complaint.  Not sent home from ER with anti-nausea.  UCx reviewed: >100k ecoli sens cipro  Has appt with PCP for CPE Friday.  Continued SOB.  Troponin at hospital was 0.06, normal range.  Reviewed EKG from hospital - tachycardia, nonspecific ST/T changes - ? mild lateral strain. CXR - found mild CM and pulm venous HTN without edema.   Scheduled appointment with cardiology for next week.  Has echo scheduled 04/01/2011.  SOB worse over last several months. COPD dx in chart, pt states she doesn't think has this dx, has not had PFTs.  Review of Systems Per HPI    Objective:   Physical Exam  Nursing note and vitals reviewed. Constitutional: She appears well-developed and well-nourished. No distress.  HENT:  Head: Normocephalic and atraumatic.  Mouth/Throat: Oropharynx is clear and moist. No oropharyngeal exudate.  Eyes: Conjunctivae and EOM are normal. Pupils are equal, round, and reactive to light. No scleral icterus.  Neck: Normal range of motion. Neck supple. No JVD present.  Cardiovascular: Normal rate, regular rhythm, normal heart sounds and intact distal pulses.   No murmur heard. Pulmonary/Chest: Effort normal and breath sounds normal. No respiratory distress. She has no wheezes. She has no rales.  Abdominal: Soft. Bowel sounds are  normal. She exhibits no distension and no mass. There is no tenderness. There is CVA tenderness (bilateral). There is no rebound and no guarding.  Musculoskeletal: She exhibits no edema.       No calf tenderness  Lymphadenopathy:    She has no cervical adenopathy.  Skin: Skin is warm and dry. No rash noted.  Psychiatric: She has a normal mood and affect.      Assessment & Plan:

## 2011-03-23 NOTE — Assessment & Plan Note (Addendum)
3wk h/o progressively worsening dyspnea. CXR with question of cardiomegaly, some pulm venous congestion. Vitals stable, O2 sat good.  Check EKG today - NSR 91, nl axis, intervals, no ST/T changes. Check blood work today BNP, electrolytes. H/o COPD - could be due to this.  Alb/atrovent neb today - afterwards pt felt breathing easier.  Sent home with alb inhaler.  Will need spirometry.  No wheezing on exam today. Continues to smoke - continued to encourage cessation. Has cards appt next week with echo.  May await that visit.  advised if worsening SOB or changing sxs, to notify us sooner.

## 2011-03-23 NOTE — Patient Instructions (Addendum)
Shot of phenergan for nausea today. Start zofran for nausea.  If unable to keep even this down, please let us know. Good to see you today, call us with questions.  Keep appointment on Friday with Dr. Ermalene Searing and next week with cardiology. Please seek care if worsening shortness of breath in the meantime. EKG overall normal today. I'd like to check blood work again today. As breathing treatment helped, I have started you albuterol inhaler to use as needed for shortness of breath.

## 2011-03-23 NOTE — Assessment & Plan Note (Signed)
R kidney on CT scan last week in ER. On correct abx, UCx returned with >100k ecoli sens cipro. Continue abx. Significant nausea - phenergan today, sent home with zofran. Update if not improving as expected.

## 2011-03-26 ENCOUNTER — Encounter: Payer: Self-pay | Admitting: Family Medicine

## 2011-03-26 ENCOUNTER — Ambulatory Visit (INDEPENDENT_AMBULATORY_CARE_PROVIDER_SITE_OTHER): Payer: BC Managed Care – PPO | Admitting: Family Medicine

## 2011-03-26 VITALS — BP 122/80 | HR 77 | Temp 97.8°F | Ht 61.0 in | Wt 174.8 lb

## 2011-03-26 DIAGNOSIS — R0600 Dyspnea, unspecified: Secondary | ICD-10-CM

## 2011-03-26 DIAGNOSIS — N12 Tubulo-interstitial nephritis, not specified as acute or chronic: Secondary | ICD-10-CM

## 2011-03-26 DIAGNOSIS — E871 Hypo-osmolality and hyponatremia: Secondary | ICD-10-CM | POA: Insufficient documentation

## 2011-03-26 DIAGNOSIS — R0989 Other specified symptoms and signs involving the circulatory and respiratory systems: Secondary | ICD-10-CM

## 2011-03-26 DIAGNOSIS — E039 Hypothyroidism, unspecified: Secondary | ICD-10-CM

## 2011-03-26 LAB — BASIC METABOLIC PANEL
BUN: 8 mg/dL (ref 6–23)
CO2: 28 mEq/L (ref 19–32)
Calcium: 9 mg/dL (ref 8.4–10.5)
Chloride: 101 mEq/L (ref 96–112)
Creatinine, Ser: 0.7 mg/dL (ref 0.4–1.2)
GFR: 88.52 mL/min (ref >60.00)
Glucose, Bld: 95 mg/dL (ref 70–99)
Potassium: 4.8 mEq/L (ref 3.5–5.1)
Sodium: 135 mEq/L (ref 135–145)

## 2011-03-26 NOTE — Assessment & Plan Note (Signed)
Lab Results  Component Value Date   TSH 0.57 08/07/2010

## 2011-03-26 NOTE — Assessment & Plan Note (Addendum)
Improved symptoms, although some residual low back pain, 1 more day of antibiotics... May be MSK from cough at this point. Use tylenol for pain. Stop tramadol to help with fatigue and nausea.

## 2011-03-26 NOTE — Patient Instructions (Addendum)
Use tylenol for pain as needed... If not helping can use diclofenac for pain (as long as not irritating stomach, take with food)  Add mucinex DM for cough. Keep appt with cardiology. Try to return to normal oral intake as able. We will call you with labs results. Complete antibitoics. We will have you follow up in 1 week.

## 2011-03-26 NOTE — Assessment & Plan Note (Signed)
Likely due to emesis and infection, poor po intake. No longer dehydrated.  Recheck today.

## 2011-03-26 NOTE — Progress Notes (Signed)
Subjective:    Patient ID: Tiffany Orozco, female    DOB: Jun 26, 1950, 60 y.o.   MRN: [id number]  HPI  60 year old female with history of COPD, CVA, low thyroid, high cholesterol... Presents today for annual CPX, but she does not feels well and would like to reschedule CPX.  Treated with amox 9/20, Z-pack 10/2 for sinus infection... symtpoms resolved completely.  She recently was seen by Dr. Reece Agar on 03/19/2011 with concern for pyelo, endorsed SOB and nausea, unable to keep anything down, sent to ER.  Reviewed records from ER. Dx with R pyelo by CT scan, although L CVA pain >R. WBC 26.7. Given shot of rocephin, zofran, treated with cipro 500mg  bid for 10 day course outpt. Sent home.  She was compliant with abx. Given tramadol as well for pain.  Has not been taking effexor 2/2 concern with cipro interaction.  U Cx: Ecoli, cipro sens.  At appt on 03/23/2011... She was seen by Dr. Reece Agar for ER follow up... Has nausea (given phenergan in office and zofran) Continued SOB. Troponin at hospital was 0.06, normal range. Reviewed EKG from hospital - tachycardia, nonspecific ST/T changes - ? mild lateral strain.  CXR - found mild CM and pulm venous HTN without edema.  Scheduled appointment with cardiology for next week. Has echo scheduled 04/01/2011.   BNP was normal on 10/23. Had sleep study 09/2010 of this year, neg.  TODAY per pt: 3wk h/o progressively worsening dyspnea. Has noted new productive cough (worse at night) and sore throat in last, body ache from coughing. COPD dx in chart, pt states she doesn't think has this dx, has not had PFTs. Albuterol/atrovent nebs at 10/23 appt helped her breath easier. She is a daily smoker for the last 25 years.. Stop smoking in last week.  She has 2 more tablets of cipro to complete 10 days course. Still having pain in B mid back...she feels may be due to coughing as well. Tramadol is keeping her up at night. Nausea improved some on zofran. Small amount of emesis  this AM, more prior to this. Eating and drinking moderately well.   Hyponatremia: Noted in ER on 10/19 to be  129, then 10.22 at 128... Improved some last check 10/23 to 130.       Review of Systems  Constitutional: Negative for fever and fatigue.  HENT: Negative for ear pain.   Eyes: Negative for pain.  Respiratory: Positive for shortness of breath. Negative for chest tightness.   Cardiovascular: Negative for chest pain, palpitations and leg swelling.  Gastrointestinal: Negative for abdominal pain.  Genitourinary: Negative for dysuria, hematuria, vaginal bleeding and vaginal pain.  Musculoskeletal: Positive for back pain.  Neurological: Negative for syncope and headaches.  Psychiatric/Behavioral: Negative for dysphoric mood.       Objective:   Physical Exam  Constitutional: Vital signs are normal. She appears well-developed and well-nourished. She is cooperative.  Non-toxic appearance. She does not appear ill. No distress.       Fatigued appearing in NAD  HENT:  Head: Normocephalic.  Right Ear: Hearing, tympanic membrane, external ear and ear canal normal.  Left Ear: Hearing, tympanic membrane, external ear and ear canal normal.  Nose: Nose normal.  Eyes: Conjunctivae, EOM and lids are normal. Pupils are equal, round, and reactive to light. No foreign bodies found.  Neck: Trachea normal and normal range of motion. Neck supple. Carotid bruit is not present. No mass and no thyromegaly present.  Cardiovascular: Normal rate, regular rhythm,  S1 normal, S2 normal, normal heart sounds and intact distal pulses.  Exam reveals no gallop.   No murmur heard. Pulmonary/Chest: Effort normal and breath sounds normal. No respiratory distress. She has no wheezes. She has no rhonchi. She has no rales.  Abdominal: Soft. Normal appearance and bowel sounds are normal. She exhibits no distension, no fluid wave, no abdominal bruit and no mass. There is no hepatosplenomegaly. There is no tenderness.  There is no rebound, no guarding and no CVA tenderness. No hernia.  Genitourinary: Uterus normal. No breast bleeding.       CPX deferred today.  Lymphadenopathy:    She has no cervical adenopathy.    She has no axillary adenopathy.  Neurological: She is alert. She has normal strength. No cranial nerve deficit or sensory deficit.  Skin: Skin is warm, dry and intact. No rash noted.  Psychiatric: Her speech is normal and behavior is normal. Judgment normal. Her mood appears not anxious. Cognition and memory are normal. She does not exhibit a depressed mood.          Assessment & Plan:

## 2011-03-26 NOTE — Assessment & Plan Note (Addendum)
New symptoms of possible viral URI in last few days. Spirometry today : Normal, except mild restriction.  Has appt with cards 11/1 for further eval nonspecific change on EKG, ? CM , pulm HTn on CXR... Has ECHO scheduled. Of note BNP normal at recent check.. Lung exam clear today.

## 2011-04-01 ENCOUNTER — Encounter: Payer: Self-pay | Admitting: Cardiovascular Disease

## 2011-04-01 ENCOUNTER — Ambulatory Visit (INDEPENDENT_AMBULATORY_CARE_PROVIDER_SITE_OTHER): Payer: BC Managed Care – PPO | Admitting: Cardiovascular Disease

## 2011-04-01 ENCOUNTER — Ambulatory Visit (HOSPITAL_COMMUNITY): Payer: BC Managed Care – PPO | Attending: Cardiovascular Disease | Admitting: Radiology

## 2011-04-01 DIAGNOSIS — E7800 Pure hypercholesterolemia, unspecified: Secondary | ICD-10-CM

## 2011-04-01 DIAGNOSIS — I639 Cerebral infarction, unspecified: Secondary | ICD-10-CM

## 2011-04-01 DIAGNOSIS — I079 Rheumatic tricuspid valve disease, unspecified: Secondary | ICD-10-CM | POA: Insufficient documentation

## 2011-04-01 DIAGNOSIS — R05 Cough: Secondary | ICD-10-CM | POA: Insufficient documentation

## 2011-04-01 DIAGNOSIS — R011 Cardiac murmur, unspecified: Secondary | ICD-10-CM

## 2011-04-01 DIAGNOSIS — E785 Hyperlipidemia, unspecified: Secondary | ICD-10-CM | POA: Insufficient documentation

## 2011-04-01 DIAGNOSIS — R9389 Abnormal findings on diagnostic imaging of other specified body structures: Secondary | ICD-10-CM | POA: Insufficient documentation

## 2011-04-01 DIAGNOSIS — R059 Cough, unspecified: Secondary | ICD-10-CM | POA: Insufficient documentation

## 2011-04-01 DIAGNOSIS — I517 Cardiomegaly: Secondary | ICD-10-CM | POA: Insufficient documentation

## 2011-04-01 DIAGNOSIS — J309 Allergic rhinitis, unspecified: Secondary | ICD-10-CM

## 2011-04-01 DIAGNOSIS — E669 Obesity, unspecified: Secondary | ICD-10-CM | POA: Insufficient documentation

## 2011-04-01 DIAGNOSIS — I6350 Cerebral infarction due to unspecified occlusion or stenosis of unspecified cerebral artery: Secondary | ICD-10-CM

## 2011-04-01 DIAGNOSIS — I059 Rheumatic mitral valve disease, unspecified: Secondary | ICD-10-CM | POA: Insufficient documentation

## 2011-04-01 DIAGNOSIS — R030 Elevated blood-pressure reading, without diagnosis of hypertension: Secondary | ICD-10-CM

## 2011-04-01 DIAGNOSIS — J449 Chronic obstructive pulmonary disease, unspecified: Secondary | ICD-10-CM

## 2011-04-01 DIAGNOSIS — J4489 Other specified chronic obstructive pulmonary disease: Secondary | ICD-10-CM

## 2011-04-01 DIAGNOSIS — F17200 Nicotine dependence, unspecified, uncomplicated: Secondary | ICD-10-CM | POA: Insufficient documentation

## 2011-04-01 NOTE — Assessment & Plan Note (Signed)
Needs better Rx  Consider Netty Pot , zyrtec or claritin.  D/C smoking would also help

## 2011-04-01 NOTE — Patient Instructions (Signed)
Your physician recommends that you schedule a follow-up appointment in: 3to 4 weeks  STOP LISINOPRIL

## 2011-04-01 NOTE — Assessment & Plan Note (Signed)
Continue inhalers despite PFT;s I think dyspnea related to this.  No signs of cardiac involvement with normal EF normal ECG and normal BNP

## 2011-04-01 NOTE — Assessment & Plan Note (Signed)
Mild AS F/U echo in a year

## 2011-04-01 NOTE — Assessment & Plan Note (Signed)
Stop Lisinopril  Home BP readings  F/U 3 weeks  Start calcium blocker if BP high

## 2011-04-01 NOTE — Assessment & Plan Note (Signed)
Likely related to smoking.  Continue aggrenox.  Talk to [employer] regarding cough and angioedema possibilities from study drug.  I think they are from lisinopril though

## 2011-04-01 NOTE — Assessment & Plan Note (Signed)
Cholesterol is at goal.  Continue current dose of statin and diet Rx.  No myalgias or side effects.  F/U  LFT's in 6 months. Lab Results  Component Value Date   LDLCALC 66 09/22/2010             

## 2011-04-01 NOTE — Progress Notes (Signed)
60 yo referred for CVA, HTN, dyspnea.  She has not felt well for the last few months.  I take care of her husband Tiffany Orozco.  She had a CVA recently.  Got TPA other than smoking etiology unclear.  Near complete resolution.  On Agrenox and a research study drug.  Labile BP.  Clearly has increased cough and signs of angioedema since started on lisinopril.  Dyspnea ongoing.  Nasal congestion.  Has been Rx for sinus infection and UTI in last 3 months.  Reporst recent PFT;s with "no" COPD.  Counseled for less than 10 minutes on smoking cessation.  Not motivated.  She indicates less "taste" for them since stroke.  No SSCP, cough but no sputum.  Reviewed her echo from today.  Normal LV and EF. No pulmonary hypertension.  Mild AS mean gradient 9mm Hg.  CXR 10/19 with no CHF or interstitial lung disease.  Compliant with meds Recent BNP normal  ROS: Denies fever, malais, weight loss, blurry vision, decreased visual acuity, cough, sputum, SOB, hemoptysis, pleuritic pain, palpitaitons, heartburn, abdominal pain, melena, lower extremity edema, claudication, or rash.  All other systems reviewed and negative   General: Affect appropriate Healthy:  appears stated age HEENT: normal Neck supple with no adenopathy JVP normal no bruits no thyromegaly Lungs clear with no wheezing and good diaphragmatic motion Heart:  S1/S2 SEM  No rub, gallop or click PMI normal Abdomen: benighn, BS positve, no tenderness, no AAA no bruit.  No HSM or HJR Distal pulses intact with no bruits No edema Neuro non-focal Skin warm and dry No muscular weakness  Medications Current Outpatient Prescriptions  Medication Sig Dispense Refill  . albuterol (PROVENTIL,VENTOLIN) 90 MCG/ACT inhaler Inhale 2 puffs into the lungs every 6 (six) hours as needed for wheezing.  17 g  3  . Aspirin-Caffeine (BC FAST PAIN RELIEF) 845-65 MG PACK Take 1 packet by mouth as needed.        . cetirizine (ZYRTEC) 10 MG tablet Take 1 tablet (10 mg total) by  mouth daily.  90 tablet  3  . Cholecalciferol (VITAMIN D) 1000 UNITS capsule Take 1,000 Units by mouth daily. Take 2 (1000 unit) caps by mouth daily       . co-enzyme Q-10 30 MG capsule Take 30 mg by mouth daily.        Marland Kitchen dipyridamole-aspirin (AGGRENOX) 25-200 MG per 12 hr capsule Take 1 capsule by mouth 2 (two) times daily.  180 capsule  1  . levothyroxine (SYNTHROID) 125 MCG tablet Take 1 tablet (125 mcg total) by mouth daily.  90 tablet  3  . Omega-3 Fatty Acids (FISH OIL) 1000 MG CAPS Take by mouth. 3 by mouth daily       . ondansetron (ZOFRAN) 4 MG tablet Take 1 tablet (4 mg total) by mouth every 8 (eight) hours as needed for nausea.  30 tablet  1  . pantoprazole (PROTONIX) 40 MG tablet TAKE 1 TABLET BY MOUTH ONCE A DAY  90 tablet  0  . simvastatin (ZOCOR) 20 MG tablet Take 1 tablet (20 mg total) by mouth daily.  90 tablet  1  . STUDY MEDICATION as directed. Stroke medicine      . vitamin E 400 UNIT capsule Take 400 Units by mouth daily.          Allergies Niacin  Family History: Family History  Problem Relation Age of Onset  . Skin cancer Father   . Hypertension Mother     Social History: History  Social History  . Marital Status: Married    Spouse Name: Nyella Eckels    Number of Children: 2  . 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 Smoking status: Current Everyday Smoker -- 0.5 packs/day  . Smokeless tobacco: Not on file  . Alcohol Use: Not on file  . Drug Use: Not on file  . Sexually Active: Not on file   Other Topics Concern  . Not on file   Social History Narrative   5/10 husband had open heart surgery, not working, 2 grandchildren, siblings living and healthy    Electrocardiogram: 03/23/11  Normal ECG  Assessment and Plan

## 2011-04-09 ENCOUNTER — Ambulatory Visit (INDEPENDENT_AMBULATORY_CARE_PROVIDER_SITE_OTHER): Payer: BC Managed Care – PPO | Admitting: Family Medicine

## 2011-04-09 ENCOUNTER — Encounter: Payer: Self-pay | Admitting: Family Medicine

## 2011-04-09 VITALS — BP 120/78 | HR 100 | Temp 98.9°F | Ht 60.5 in | Wt 173.8 lb

## 2011-04-09 DIAGNOSIS — N12 Tubulo-interstitial nephritis, not specified as acute or chronic: Secondary | ICD-10-CM

## 2011-04-09 DIAGNOSIS — R8290 Unspecified abnormal findings in urine: Secondary | ICD-10-CM

## 2011-04-09 DIAGNOSIS — R0989 Other specified symptoms and signs involving the circulatory and respiratory systems: Secondary | ICD-10-CM

## 2011-04-09 DIAGNOSIS — F329 Major depressive disorder, single episode, unspecified: Secondary | ICD-10-CM

## 2011-04-09 DIAGNOSIS — R030 Elevated blood-pressure reading, without diagnosis of hypertension: Secondary | ICD-10-CM

## 2011-04-09 DIAGNOSIS — R82998 Other abnormal findings in urine: Secondary | ICD-10-CM

## 2011-04-09 DIAGNOSIS — R0600 Dyspnea, unspecified: Secondary | ICD-10-CM

## 2011-04-09 DIAGNOSIS — R0609 Other forms of dyspnea: Secondary | ICD-10-CM

## 2011-04-09 DIAGNOSIS — E871 Hypo-osmolality and hyponatremia: Secondary | ICD-10-CM

## 2011-04-09 LAB — POCT URINALYSIS DIPSTICK
Bilirubin, UA: NEGATIVE
Glucose, UA: NEGATIVE
Ketones, UA: NEGATIVE
Leukocytes, UA: NEGATIVE
Nitrite, UA: NEGATIVE
Protein, UA: NEGATIVE
Spec Grav, UA: 1.02
Urobilinogen, UA: NEGATIVE
pH, UA: 6

## 2011-04-09 LAB — POCT UA - MICROSCOPIC ONLY

## 2011-04-09 NOTE — Assessment & Plan Note (Signed)
Stable off venlafaxine.

## 2011-04-09 NOTE — Progress Notes (Signed)
  Subjective:    Patient ID: Tiffany Orozco, female    DOB: [birth year], 60 y.o.   MRN: [id number]  HPI    Review of Systems  Constitutional: Negative for fever and fatigue.  HENT: Negative for ear pain.   Eyes: Negative for pain.  Respiratory: Negative for chest tightness and shortness of breath.   Cardiovascular: Negative for chest pain, palpitations and leg swelling.  Gastrointestinal: Negative for abdominal pain.  Genitourinary: Negative for dysuria.       Objective:   Physical Exam  Constitutional: Vital signs are normal. She appears well-developed and well-nourished. She is cooperative.  Non-toxic appearance. She does not appear ill. No distress.  HENT:  Head: Normocephalic.  Right Ear: Hearing, tympanic membrane, external ear and ear canal normal. Tympanic membrane is not erythematous, not retracted and not bulging.  Left Ear: Hearing, tympanic membrane, external ear and ear canal normal. Tympanic membrane is not erythematous, not retracted and not bulging.  Nose: No mucosal edema or rhinorrhea. Right sinus exhibits no maxillary sinus tenderness and no frontal sinus tenderness. Left sinus exhibits no maxillary sinus tenderness and no frontal sinus tenderness.  Mouth/Throat: Uvula is midline, oropharynx is clear and moist and mucous membranes are normal.  Eyes: Conjunctivae, EOM and lids are normal. Pupils are equal, round, and reactive to light. No foreign bodies found.  Neck: Trachea normal and normal range of motion. Neck supple. Carotid bruit is not present. No mass and no thyromegaly present.  Cardiovascular: Normal rate, regular rhythm, S1 normal, S2 normal, normal heart sounds, intact distal pulses and normal pulses.  Exam reveals no gallop and no friction rub.   No murmur heard. Pulmonary/Chest: Effort normal and breath sounds normal. Not tachypneic. No respiratory distress. She has no decreased breath sounds. She has no wheezes. She has no rhonchi. She has no rales.    Abdominal: Soft. Normal appearance and bowel sounds are normal. There is no tenderness.  Neurological: She is alert.  Skin: Skin is warm, dry and intact. No rash noted.  Psychiatric: Her speech is normal and behavior is normal. Judgment and thought content normal. Her mood appears not anxious. Cognition and memory are normal. She does not exhibit a depressed mood.          Assessment & Plan:

## 2011-04-09 NOTE — Assessment & Plan Note (Signed)
Well controlled despite being off ACEI.

## 2011-04-09 NOTE — Assessment & Plan Note (Signed)
Back to baseline off lisinopril... ? Causing SE. Likely also due at least somewhat to COPD.  Per Dr. Eden Emms.. No cardiac cause.

## 2011-04-09 NOTE — Progress Notes (Signed)
  Subjective:    Patient ID: Tiffany Orozco, female    DOB: [birth year], 60 y.o.   MRN: [id number]  HPI  Atr last OV 10/26: Pyelonephritis - Kerby Nora, MD 03/26/2011 10:48 AM Addendum  Improved symptoms, although some residual low back pain, 1 more day of antibiotics... May be MSK from cough at this point. Use tylenol for pain. Stop tramadol to help with fatigue and nausea.  Hyponatremia - Kerby Nora, MD 03/26/2011 10:50 AM Signed  Likely due to emesis and infection, poor po intake. No longer dehydrated. Recheck showed resolution to 135.  Dyspnea - Kerby Nora, MD 03/26/2011 11:01 AM Addendum  New symptoms of possible viral URI in last few days.  Spirometry today : Normal, except mild restriction.   Had appt with cards 11/1 for further eval nonspecific change on EKG, ? CM , pulm HTN on CXR...  Dr Fabio Bering noted reviewed: No signs of cardiac involvement with normal EF normal ECG and normal BNP. He felt most symptoms likely related to COPD  or medication SE despite nml PFTs  Given continued smoking. Dr. Eden Emms removed lisinopril presumeably to see if will help with breathing issues... Recommended amlodipine if BP elevated in future: today BP remains well controlled on no med.   Today she reports: breathing issues improved off ACEI. BP remains at goal at home and here.  She has also stopped antidepressant (venlafaxine)... Tried to restart and felt bad. Mood stable.  Has not stopped study med.. This is actos pt has found out.  Back pain improved, no dysuria, no fever        Review of Systems     Objective:   Physical Exam        Assessment & Plan:

## 2011-04-09 NOTE — Patient Instructions (Signed)
Schedule CPX with fasting labs prior in 1-2 months.

## 2011-04-09 NOTE — Assessment & Plan Note (Signed)
Resolved

## 2011-04-18 ENCOUNTER — Other Ambulatory Visit: Payer: Self-pay | Admitting: Family Medicine

## 2011-04-20 ENCOUNTER — Encounter: Payer: Self-pay | Admitting: Cardiovascular Disease

## 2011-04-20 ENCOUNTER — Ambulatory Visit (INDEPENDENT_AMBULATORY_CARE_PROVIDER_SITE_OTHER): Payer: BC Managed Care – PPO | Admitting: Cardiovascular Disease

## 2011-04-20 DIAGNOSIS — I639 Cerebral infarction, unspecified: Secondary | ICD-10-CM

## 2011-04-20 DIAGNOSIS — I6350 Cerebral infarction due to unspecified occlusion or stenosis of unspecified cerebral artery: Secondary | ICD-10-CM

## 2011-04-20 DIAGNOSIS — R030 Elevated blood-pressure reading, without diagnosis of hypertension: Secondary | ICD-10-CM

## 2011-04-20 MED ORDER — AMLODIPINE BESYLATE 10 MG PO TABS: 10.0000 mg | ORAL_TABLET | Freq: Every day | ORAL | Status: DC

## 2011-04-20 NOTE — Assessment & Plan Note (Signed)
Cholesterol is at goal.  Continue current dose of statin and diet Rx.  No myalgias or side effects.  F/U  LFT's in 6 months. Lab Results  Component Value Date   LDLCALC 66 09/22/2010

## 2011-04-20 NOTE — Assessment & Plan Note (Signed)
Start norvasc and list lisinopril and ACE as allergy

## 2011-04-20 NOTE — Assessment & Plan Note (Signed)
No carotid disease continue antiplatlet Rx

## 2011-04-20 NOTE — Progress Notes (Signed)
60 yo referred for CVA, HTN, dyspnea. She has not felt well for the last few months. I take care of her husband [name]. She had a CVA recently. Got TPA other than smoking etiology unclear. Near complete resolution. On Agrenox and a research study drug. Labile BP. Clearly has increased cough and signs of angioedema since started on lisinopril. Dyspnea ongoing. Nasal congestion. Has been Rx for sinus infection and UTI in last 3 months. Reporst recent PFT;s with "no" COPD. Counseled for less than 10 minutes on smoking cessation. Not motivated. She indicates less "taste" for them since stroke. No SSCP, cough but no sputum. Reviewed her echo from today. Normal LV and EF. No pulmonary hypertension. Mild AS mean gradient 9mm Hg. CXR 10/19 with no CHF or interstitial lung disease. Compliant with meds  Recent BNP normal  Feels much better off lisinopril  BP is high her and at home.  Will start calcium blocker  ROS: Denies fever, malais, weight loss, blurry vision, decreased visual acuity, cough, sputum, SOB, hemoptysis, pleuritic pain, palpitaitons, heartburn, abdominal pain, melena, lower extremity edema, claudication, or rash.  All other systems reviewed and negative  General: Affect appropriate Healthy:  appears stated age HEENT: normal Neck supple with no adenopathy JVP normal no bruits no thyromegaly Lungs clear with no wheezing and good diaphragmatic motion Heart:  S1/S2 no murmur,rub, gallop or click PMI normal Abdomen: benighn, BS positve, no tenderness, no AAA no bruit.  No HSM or HJR Distal pulses intact with no bruits No edema Neuro non-focal Skin warm and dry No muscular weakness   Current Outpatient Prescriptions  Medication Sig Dispense Refill  . albuterol (PROVENTIL,VENTOLIN) 90 MCG/ACT inhaler Inhale 2 puffs into the lungs every 6 (six) hours as needed for wheezing.  17 g  3  . Aspirin-Caffeine (BC FAST PAIN RELIEF) 845-65 MG PACK Take 1 packet by mouth as needed.        .  cetirizine (ZYRTEC) 10 MG tablet Take 1 tablet (10 mg total) by mouth daily.  90 tablet  3  . Cholecalciferol (VITAMIN D) 1000 UNITS capsule Take 1,000 Units by mouth daily. Take 2 (1000 unit) caps by mouth daily       . co-enzyme Q-10 30 MG capsule Take 30 mg by mouth daily.        Marland Kitchen dipyridamole-aspirin (AGGRENOX) 25-200 MG per 12 hr capsule Take 1 capsule by mouth 2 (two) times daily.  180 capsule  1  . levothyroxine (SYNTHROID) 125 MCG tablet Take 1 tablet (125 mcg total) by mouth daily.  90 tablet  3  . Omega-3 Fatty Acids (FISH OIL) 1000 MG CAPS Take by mouth. 3 by mouth daily       . ondansetron (ZOFRAN) 4 MG tablet Take 1 tablet (4 mg total) by mouth every 8 (eight) hours as needed for nausea.  30 tablet  1  . pantoprazole (PROTONIX) 40 MG tablet TAKE 1 TABLET BY MOUTH ONCE A DAY  90 tablet  0  . simvastatin (ZOCOR) 20 MG tablet TAKE 1 TABLET EVERY DAY  90 tablet  1  . STUDY MEDICATION as directed. Stroke medicine Actos  3 tabs po qd      . vitamin E 400 UNIT capsule Take 400 Units by mouth daily.          Allergies  Lisinopril and Niacin  Electrocardiogram:  Assessment and Plan

## 2011-04-20 NOTE — Patient Instructions (Signed)
Your physician wants you to follow-up in:  6 MONTHS WITH DR Octavia Bruckner will receive a reminder letter in the mail two months in advance. If you don't receive a letter, please call our office to schedule the follow-up appointment. Your physician has recommended you make the following change in your medication: ADD AMLODIPINE 10 MG 1 EVERY DAY

## 2011-04-25 ENCOUNTER — Other Ambulatory Visit: Payer: Self-pay | Admitting: Family Medicine

## 2011-05-05 ENCOUNTER — Encounter: Payer: Self-pay | Admitting: Family Medicine

## 2011-05-17 ENCOUNTER — Telehealth: Payer: Self-pay | Admitting: *Deleted

## 2011-05-18 ENCOUNTER — Ambulatory Visit (INDEPENDENT_AMBULATORY_CARE_PROVIDER_SITE_OTHER): Payer: BC Managed Care – PPO | Admitting: *Deleted

## 2011-05-18 DIAGNOSIS — Z23 Encounter for immunization: Secondary | ICD-10-CM

## 2011-05-18 DIAGNOSIS — Z2911 Encounter for prophylactic immunotherapy for respiratory syncytial virus (RSV): Secondary | ICD-10-CM

## 2011-05-19 NOTE — Telephone Encounter (Signed)
Nurse visit scheduled for Shingles vaccine.

## 2011-05-20 ENCOUNTER — Encounter: Payer: Self-pay | Admitting: Family Medicine

## 2011-05-20 ENCOUNTER — Ambulatory Visit (INDEPENDENT_AMBULATORY_CARE_PROVIDER_SITE_OTHER): Payer: BC Managed Care – PPO | Admitting: Family Medicine

## 2011-05-20 VITALS — BP 114/70 | HR 100 | Temp 98.2°F | Wt 177.8 lb

## 2011-05-20 DIAGNOSIS — M545 Low back pain, unspecified: Secondary | ICD-10-CM

## 2011-05-20 LAB — POCT URINALYSIS DIPSTICK
Bilirubin, UA: NEGATIVE
Glucose, UA: NEGATIVE
Ketones, UA: NEGATIVE
Leukocytes, UA: NEGATIVE
Nitrite, UA: NEGATIVE
Protein, UA: NEGATIVE
Spec Grav, UA: <=1.005
Urobilinogen, UA: 0.2
pH, UA: 6

## 2011-05-20 NOTE — Patient Instructions (Signed)
Take Tyl as discussed. Use heat and ice as discussed. Do exercises demonstrated. Have sent urine for culture. Will call if a problem.

## 2011-05-20 NOTE — Assessment & Plan Note (Signed)
Does not appear to be a urine problem. U/A with mod RBC on dip, Micro totally negative.  Will send for culture. Suggest regular Tyl for the back. HEat and ice as discussed. Shown exercises to do. Should not take NSAIDs with Aggrenox.
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
## 2011-05-22 LAB — URINE CULTURE
Colony Count: NO GROWTH
Organism ID, Bacteria: NO GROWTH

## 2011-05-26 ENCOUNTER — Ambulatory Visit (INDEPENDENT_AMBULATORY_CARE_PROVIDER_SITE_OTHER): Payer: BC Managed Care – PPO | Admitting: Family Medicine

## 2011-05-26 ENCOUNTER — Encounter: Payer: Self-pay | Admitting: Family Medicine

## 2011-05-26 VITALS — BP 110/68 | HR 90 | Temp 98.4°F | Wt 180.5 lb

## 2011-05-26 DIAGNOSIS — J069 Acute upper respiratory infection, unspecified: Secondary | ICD-10-CM

## 2011-05-26 MED ORDER — AZITHROMYCIN 250 MG PO TABS: ORAL_TABLET | ORAL | Status: AC

## 2011-05-26 MED ORDER — HYDROCOD POLST-CHLORPHEN POLST 10-8 MG/5ML PO LQCR: 5.0000 mL | Freq: Every evening | ORAL | Status: DC | PRN

## 2011-05-26 NOTE — Patient Instructions (Signed)
Take antibiotic as directed.  Drink lots of fluids.  Treat sympotmatically with your albuterol inhaler, Mucinex, nasal saline irrigation, and Tylenol/Ibuprofen.Cough suppressant at night. Call if not improving as expected in 5-7 days.

## 2011-05-26 NOTE — Progress Notes (Signed)
Subjective:    Patient ID: Tiffany Orozco, female    DOB: Feb 12, 1951, 60 y.o.   MRN: [id number]  HPI  60 year old female pt of Dr. Ermalene Searing, new to me with very complicated history of COPD, CVA and recent ER evaluation for pyelonephritis (03/2011) here for ST, fever and congestion.  Started over 5 days ago, increased productive cough, fever, body aches, sinus pressure.  No CP or SOB.  Treated with amox 9/20, Z-pack 10/2 for sinus infection- symtpoms resolved completely.  Was seen by Dr. Reece Agar on 03/19/2011 with concern for pyelo, endorsed SOB and nausea, unable to keep anything down, sent to ER.  Reviewed records from ER. Dx with R pyelo by CT scan, although L CVA pain >R. WBC 26.7. Given shot of rocephin, zofran, treated with cipro 500mg  bid for 10 day course outpt. Sent home.  She was compliant with abx. Given tramadol as well for pain.   U Cx: Ecoli, cipro sens.  At appt on 03/23/2011... She was seen by Dr. Reece Agar for ER follow up... Has nausea (given phenergan in office and zofran) Continued SOB. Troponin at hospital was 0.06, normal range. Reviewed EKG from hospital - tachycardia, nonspecific ST/T changes - ? mild lateral strain.  CXR - found mild CM and pulm venous HTN without edema.   Appt with cardiology, Dr. Eden Emms last month.  Note reviewed.  Felt cough was exacerbated by ACEI allergy.  Lisinopril stopped and started on Norvasc.  Saw Dr. Hetty Ely 6 days ago for back pain- only hematuria on UA with neg urine cx.  Advised likely [facility].   She is a daily smoker for the last 25 years.Quit smoking 3 weeks ago!  Patient Active Problem List  Diagnoses  . HYPOTHYROIDISM  . HYPERCHOLESTEROLEMIA  . ANXIETY  . DEPRESSION  . UNSPECIFIED CARDIAC DYSRHYTHMIA  . ALLERGIC RHINITIS  . CHRONIC OBSTRUCTIVE PULMONARY DISEASE, ACUTE EXACERBATION  . COPD  . GERD  . CONSTIPATION, CHRONIC  . BACK PAIN, LUMBAR  . OSTEOPENIA  . MEMORY LOSS  . MURMUR  . POSTMENOPAUSAL STATUS  . TRIGGER FINGER,  RIGHT THUMB  . CVA (cerebrovascular accident)  . Elevated blood pressure reading without diagnosis of hypertension  . Pyelonephritis  . Dyspnea  . Hyponatremia   Past Medical History  Diagnosis Date  . Anxiety   . Depression   . GERD (gastroesophageal reflux disease)   . Unspecified hypothyroidism   . Skin cancer of face   . Skin cancer of arm   . Allergy   . COPD (chronic obstructive pulmonary disease)   . Hyperlipidemia   . Hypertension   . Obesity   . DDD (degenerative disc disease), lumbar     mild  . Stroke 07/2010   Past Surgical History  Procedure Date  . Tubal ligation 1975  . Appendectomy   . Foot surgery 03/31/2009    R foot  . Emgs 7/04    atms and wrists neg  . Cholecystectomy 2004   History  Substance Use Topics  . Smoking status: Current Everyday Smoker -- 0.5 packs/day for 25 years  . Smokeless tobacco: Never Used  . Alcohol Use: Not on file   Family History  Problem Relation Age of Onset  . Skin cancer Father   . Hypertension Mother    Allergies  Allergen Reactions  . Lisinopril     Shortness of breath.  . Niacin     REACTION: dizziness   Current Outpatient Prescriptions on File Prior to Visit  Medication  Sig Dispense Refill  . AGGRENOX 25-200 MG per 12 hr capsule TAKE 1 CAPSULE BY MOUTH 2 TIMES DAILY.  180 capsule  1  . albuterol (PROVENTIL,VENTOLIN) 90 MCG/ACT inhaler Inhale 2 puffs into the lungs every 6 (six) hours as needed for wheezing.  17 g  3  . amLODipine (NORVASC) 10 MG tablet Take 1 tablet (10 mg total) by mouth daily.  30 tablet  11  . Aspirin-Caffeine (BC FAST PAIN RELIEF) 845-65 MG PACK Take 1 packet by mouth as needed.        . cetirizine (ZYRTEC) 10 MG tablet Take 1 tablet (10 mg total) by mouth daily.  90 tablet  3  . Cholecalciferol (VITAMIN D) 1000 UNITS capsule Take 1,000 Units by mouth daily. Take 2 (1000 unit) caps by mouth daily       . co-enzyme Q-10 30 MG capsule Take 30 mg by mouth daily.        Marland Kitchen levothyroxine  (SYNTHROID) 125 MCG tablet Take 1 tablet (125 mcg total) by mouth daily.  90 tablet  3  . Omega-3 Fatty Acids (FISH OIL) 1000 MG CAPS Take by mouth. 3 by mouth daily       . ondansetron (ZOFRAN) 4 MG tablet Take 1 tablet (4 mg total) by mouth every 8 (eight) hours as needed for nausea.  30 tablet  1  . pantoprazole (PROTONIX) 40 MG tablet TAKE 1 TABLET BY MOUTH ONCE A DAY  90 tablet  0  . simvastatin (ZOCOR) 20 MG tablet TAKE 1 TABLET EVERY DAY  90 tablet  1  . STUDY MEDICATION as directed. Stroke medicine Actos  3 tabs po qd      . vitamin E 400 UNIT capsule Take 400 Units by mouth daily.         The PMH, PSH, Social History, Family History, Medications, and allergies have been reviewed in [facility], and have been updated if relevant.  Review of Systems  Constitutional: Negative for fever and fatigue.  HENT: Negative for ear pain.   Eyes: Negative for pain.  Respiratory: Positive for shortness of breath. Negative for chest tightness.   Cardiovascular: Negative for chest pain, palpitations and leg swelling.  Gastrointestinal: Negative for abdominal pain.  Genitourinary: Negative for dysuria, hematuria, vaginal bleeding and vaginal pain.  Musculoskeletal: Positive for back pain.  Neurological: Negative for syncope and headaches.  Psychiatric/Behavioral: Negative for dysphoric mood.       Objective:   Physical Exam  BP 110/68  Pulse 90  Temp(Src) 98.4 F (36.9 C) (Oral)  Wt 180 lb 8 oz (81.874 kg)  SpO2 93%  Constitutional: Vital signs are normal. She appears well-developed and well-nourished. She is cooperative.  Non-toxic appearance. She does not appear ill. No distress.       Fatigued appearing in NAD, no increased WOB  HENT:  TMs retracted bilaterally, pharyngeal erythema Eyes: Conjunctivae, EOM and lids are normal. Pupils are equal, round, and reactive to light. No foreign bodies found.  Neck: Trachea normal and normal range of motion. Neck supple. Carotid bruit is not present.  No mass and no thyromegaly present.  Cardiovascular: Normal rate, regular rhythm, S1 normal, S2 normal, normal heart sounds and intact distal pulses.  Exam reveals no gallop.   No murmur heard. Pulmonary/Chest: No increased WOB, diffuse exp wheezes, no ronchi Abdominal: Soft. Normal appearance and bowel sounds are normal. She exhibits no distension, no fluid wave, no abdominal bruit and no mass. There is no hepatosplenomegaly. There is no tenderness.  There is no rebound, no guarding and no  Lymphadenopathy:    She has no cervical adenopathy.  Neurological: She is alert. She has normal strength. No cranial nerve deficit or sensory deficit.  Skin: Skin is warm, dry and intact. No rash noted.  Psychiatric: Her speech is normal and behavior is normal. Judgment normal. Her mood appears not anxious. Cognition and memory are normal. She does not exhibit a depressed mood.     Assessment & Plan:   1. URI (upper respiratory infection)   New with wheezes on exam. Has not been using her inhaler, advised restarting it. Zpack, tussionex as needed.

## 2011-06-02 ENCOUNTER — Other Ambulatory Visit (INDEPENDENT_AMBULATORY_CARE_PROVIDER_SITE_OTHER): Payer: BC Managed Care – PPO

## 2011-06-02 ENCOUNTER — Telehealth: Payer: Self-pay | Admitting: Family Medicine

## 2011-06-02 DIAGNOSIS — E7800 Pure hypercholesterolemia, unspecified: Secondary | ICD-10-CM

## 2011-06-02 DIAGNOSIS — E039 Hypothyroidism, unspecified: Secondary | ICD-10-CM

## 2011-06-02 DIAGNOSIS — E871 Hypo-osmolality and hyponatremia: Secondary | ICD-10-CM

## 2011-06-02 DIAGNOSIS — M899 Disorder of bone, unspecified: Secondary | ICD-10-CM

## 2011-06-02 DIAGNOSIS — M8580 Other specified disorders of bone density and structure, unspecified site: Secondary | ICD-10-CM

## 2011-06-02 DIAGNOSIS — M949 Disorder of cartilage, unspecified: Secondary | ICD-10-CM

## 2011-06-02 LAB — COMPREHENSIVE METABOLIC PANEL
ALT: 16 U/L (ref 0–35)
AST: 19 U/L (ref 0–37)
Albumin: 3.8 g/dL (ref 3.5–5.2)
Alkaline Phosphatase: 77 U/L (ref 39–117)
BUN: 8 mg/dL (ref 6–23)
CO2: 27 mEq/L (ref 19–32)
Calcium: 9.1 mg/dL (ref 8.4–10.5)
Chloride: 103 mEq/L (ref 96–112)
Creatinine, Ser: 0.8 mg/dL (ref 0.4–1.2)
GFR: 73.88 mL/min (ref >60.00)
Glucose, Bld: 90 mg/dL (ref 70–99)
Potassium: 4.1 mEq/L (ref 3.5–5.1)
Sodium: 138 mEq/L (ref 135–145)
Total Bilirubin: 0.6 mg/dL (ref 0.3–1.2)
Total Protein: 7.6 g/dL (ref 6.0–8.3)

## 2011-06-02 LAB — CBC WITH DIFFERENTIAL/PLATELET
Basophils Absolute: 0.1 10*3/uL (ref 0.0–0.1)
Basophils Relative: 1.2 % (ref 0.0–3.0)
Eosinophils Absolute: 0.2 10*3/uL (ref 0.0–0.7)
Eosinophils Relative: 3.4 % (ref 0.0–5.0)
HCT: 38.1 % (ref 36.0–46.0)
Hemoglobin: 12.8 g/dL (ref 12.0–15.0)
Lymphocytes Relative: 41.7 % (ref 12.0–46.0)
Lymphs Abs: 2.7 10*3/uL (ref 0.7–4.0)
MCHC: 33.7 g/dL (ref 30.0–36.0)
MCV: 89.6 fl (ref 78.0–100.0)
Monocytes Absolute: 0.5 10*3/uL (ref 0.1–1.0)
Monocytes Relative: 7.3 % (ref 3.0–12.0)
Neutro Abs: 3 10*3/uL (ref 1.4–7.7)
Neutrophils Relative %: 46.4 % (ref 43.0–77.0)
Platelets: 515 10*3/uL — ABNORMAL HIGH (ref 150.0–400.0)
RBC: 4.25 Mil/uL (ref 3.87–5.11)
RDW: 13.9 % (ref 11.5–14.6)
WBC: 6.5 10*3/uL (ref 4.5–10.5)

## 2011-06-02 LAB — LIPID PANEL
Cholesterol: 127 mg/dL (ref 0–200)
HDL: 27.2 mg/dL — ABNORMAL LOW (ref >39.00)
LDL Cholesterol: 78 mg/dL (ref 0–99)
Total CHOL/HDL Ratio: 5
Triglycerides: 111 mg/dL (ref 0.0–149.0)
VLDL: 22.2 mg/dL (ref 0.0–40.0)

## 2011-06-02 LAB — TSH: TSH: 0.68 u[IU]/mL (ref 0.35–5.50)

## 2011-06-02 NOTE — Telephone Encounter (Signed)
Message copied by Excell Seltzer on Wed Jun 02, 2011  4:37 PM ------      Message from: Alvina Chou      Created: Wed May 26, 2011  9:23 AM      Regarding: lab orders for 06-02-11       Patient is scheduled for CPX labs, please order future labs, Thanks , Camelia Eng

## 2011-06-03 ENCOUNTER — Other Ambulatory Visit: Payer: Self-pay | Admitting: Family Medicine

## 2011-06-03 ENCOUNTER — Encounter: Payer: Self-pay | Admitting: Family Medicine

## 2011-06-03 ENCOUNTER — Ambulatory Visit (INDEPENDENT_AMBULATORY_CARE_PROVIDER_SITE_OTHER): Payer: BC Managed Care – PPO | Admitting: Family Medicine

## 2011-06-03 VITALS — BP 110/62 | HR 82 | Temp 98.3°F | Ht 60.5 in | Wt 171.5 lb

## 2011-06-03 DIAGNOSIS — J069 Acute upper respiratory infection, unspecified: Secondary | ICD-10-CM

## 2011-06-03 LAB — VITAMIN D 25 HYDROXY (VIT D DEFICIENCY, FRACTURES): Vit D, 25-Hydroxy: 71 ng/mL (ref 30–89)

## 2011-06-03 MED ORDER — BENZONATATE 100 MG PO CAPS: 100.0000 mg | ORAL_CAPSULE | Freq: Four times a day (QID) | ORAL | Status: DC | PRN

## 2011-06-03 NOTE — Patient Instructions (Signed)
Good to see you. Your lungs sound much better. Try taking the Tessalon as needed for cough.

## 2011-06-03 NOTE — Progress Notes (Signed)
Subjective:    Patient ID: Tiffany Orozco, female    DOB: Aug 19, 1950, 61 y.o.   MRN: [id number]  HPI  61 year old female pt of Dr. Ermalene Searing, with very complicated history of COPD, CVA  here for persistent ST, fever and congestion.  Saw her on 12/26 for  5 days of increased productive cough, fever, body aches, sinus pressure.  No CP or SOB. Treated with Zpack and Tussionex.  Feels better, not wheezing but still coughing.  Tussionex makes her feel "anxious."  No longer febrile.     Patient Active Problem List  Diagnoses  . HYPOTHYROIDISM  . HYPERCHOLESTEROLEMIA  . ANXIETY  . DEPRESSION  . UNSPECIFIED CARDIAC DYSRHYTHMIA  . ALLERGIC RHINITIS  . CHRONIC OBSTRUCTIVE PULMONARY DISEASE, ACUTE EXACERBATION  . COPD  . GERD  . CONSTIPATION, CHRONIC  . BACK PAIN, LUMBAR  . OSTEOPENIA  . MEMORY LOSS  . MURMUR  . POSTMENOPAUSAL STATUS  . TRIGGER FINGER, RIGHT THUMB  . CVA (cerebrovascular accident)  . Elevated blood pressure reading without diagnosis of hypertension  . Pyelonephritis  . Dyspnea  . Hyponatremia  . URI (upper respiratory infection)   Past Medical History  Diagnosis Date  . Anxiety   . Depression   . GERD (gastroesophageal reflux disease)   . Unspecified hypothyroidism   . Skin cancer of face   . Skin cancer of arm   . Allergy   . COPD (chronic obstructive pulmonary disease)   . Hyperlipidemia   . Hypertension   . Obesity   . DDD (degenerative disc disease), lumbar     mild  . Stroke 07/2010   Past Surgical History  Procedure Date  . Tubal ligation 1975  . Appendectomy   . Foot surgery 03/31/2009    R foot  . Emgs 7/04    atms and wrists neg  . Cholecystectomy 2004   History  Substance Use Topics  . Smoking status: Current Everyday Smoker -- 0.5 packs/day for 25 years  . Smokeless tobacco: Never Used  . Alcohol Use: Not on file   Family History  Problem Relation Age of Onset  . Skin cancer Father   . Hypertension Mother    Allergies    Allergen Reactions  . Lisinopril     Shortness of breath.  . Niacin     REACTION: dizziness   Current Outpatient Prescriptions on File Prior to Visit  Medication Sig Dispense Refill  . AGGRENOX 25-200 MG per 12 hr capsule TAKE 1 CAPSULE BY MOUTH 2 TIMES DAILY.  180 capsule  1  . albuterol (PROVENTIL,VENTOLIN) 90 MCG/ACT inhaler Inhale 2 puffs into the lungs every 6 (six) hours as needed for wheezing.  17 g  3  . amLODipine (NORVASC) 10 MG tablet Take 1 tablet (10 mg total) by mouth daily.  30 tablet  11  . Aspirin-Caffeine (BC FAST PAIN RELIEF) 845-65 MG PACK Take 1 packet by mouth as needed.        . cetirizine (ZYRTEC) 10 MG tablet Take 1 tablet (10 mg total) by mouth daily.  90 tablet  3  . chlorpheniramine-HYDROcodone (TUSSIONEX PENNKINETIC ER) 10-8 MG/5ML LQCR Take 5 mLs by mouth at bedtime as needed.  140 mL  0  . Cholecalciferol (VITAMIN D) 1000 UNITS capsule Take 1,000 Units by mouth daily. Take 2 (1000 unit) caps by mouth daily       . co-enzyme Q-10 30 MG capsule Take 30 mg by mouth daily.        Marland Kitchen  levothyroxine (SYNTHROID) 125 MCG tablet Take 1 tablet (125 mcg total) by mouth daily.  90 tablet  3  . Omega-3 Fatty Acids (FISH OIL) 1000 MG CAPS Take by mouth. 3 by mouth daily       . simvastatin (ZOCOR) 20 MG tablet TAKE 1 TABLET EVERY DAY  90 tablet  1  . STUDY MEDICATION as directed. Stroke medicine Actos  3 tabs po qd      . vitamin E 400 UNIT capsule Take 400 Units by mouth daily.         The PMH, PSH, Social History, Family History, Medications, and allergies have been reviewed in [facility], and have been updated if relevant.  Review of Systems  See HPI Constitutional: Negative for fever and fatigue.  Neg for CP or SOB      Objective:   Physical Exam  BP 110/62  Pulse 82  Temp(Src) 98.3 F (36.8 C) (Oral)  Ht 5' 0.5" (1.537 m)  Wt 171 lb 8 oz (77.792 kg)  BMI 32.94 kg/m2  Constitutional: Vital signs are normal. She appears well-developed and well-nourished. She is  cooperative.  Non-toxic appearance. She does not appear ill. No distress.       Fatigued appearing in NAD, no increased WOB  HENT:  Mild pharyngeal erythema Eyes: Conjunctivae, EOM and lids are normal. Pupils are equal, round, and reactive to light. No foreign bodies found.  Neck: Trachea normal and normal range of motion. Neck supple. Carotid bruit is not present. No mass and no thyromegaly present.  Cardiovascular: Normal rate, regular rhythm, S1 normal, S2 normal, normal heart sounds and intact distal pulses.  Exam reveals no gallop.   No murmur heard. Pulmonary/Chest: CTA bilaterally Abdominal: Soft. Normal appearance and bowel sounds are normal. She exhibits no distension, no fluid wave, no abdominal bruit and no mass. There is no hepatosplenomegaly. There is no tenderness. There is no rebound, no guarding and no  Lymphadenopathy:    She has no cervical adenopathy.  Neurological: She is alert. She has normal strength. No cranial nerve deficit or sensory deficit.  Skin: Skin is warm, dry and intact. No rash noted.  Psychiatric: Her speech is normal and behavior is normal. Judgment normal. Her mood appears not anxious. Cognition and memory are normal. She does not exhibit a depressed mood.     Assessment & Plan:   URI- lung sounds much improved. Continue support care. Rx for tessalon sent in since she could not tolerate tussionex. Has f/u scheduled with PCP next week.

## 2011-06-08 ENCOUNTER — Ambulatory Visit (INDEPENDENT_AMBULATORY_CARE_PROVIDER_SITE_OTHER): Payer: BC Managed Care – PPO | Admitting: Family Medicine

## 2011-06-08 ENCOUNTER — Other Ambulatory Visit (HOSPITAL_COMMUNITY)
Admission: RE | Admit: 2011-06-08 | Discharge: 2011-06-08 | Disposition: A | Discharge status: Home / Self Care | Payer: BC Managed Care – PPO | Source: Ambulatory Visit | Attending: Family Medicine | Admitting: Family Medicine

## 2011-06-08 ENCOUNTER — Encounter: Payer: Self-pay | Admitting: Family Medicine

## 2011-06-08 VITALS — BP 110/72 | HR 78 | Temp 98.7°F | Ht 60.5 in | Wt 172.8 lb

## 2011-06-08 DIAGNOSIS — Z0000 Encounter for general adult medical examination without abnormal findings: Secondary | ICD-10-CM

## 2011-06-08 DIAGNOSIS — D473 Essential (hemorrhagic) thrombocythemia: Secondary | ICD-10-CM | POA: Insufficient documentation

## 2011-06-08 DIAGNOSIS — E039 Hypothyroidism, unspecified: Secondary | ICD-10-CM

## 2011-06-08 DIAGNOSIS — R0600 Dyspnea, unspecified: Secondary | ICD-10-CM

## 2011-06-08 DIAGNOSIS — D75839 Thrombocytosis, unspecified: Secondary | ICD-10-CM | POA: Insufficient documentation

## 2011-06-08 DIAGNOSIS — D696 Thrombocytopenia, unspecified: Secondary | ICD-10-CM

## 2011-06-08 DIAGNOSIS — Z01419 Encounter for gynecological examination (general) (routine) without abnormal findings: Secondary | ICD-10-CM | POA: Insufficient documentation

## 2011-06-08 DIAGNOSIS — J449 Chronic obstructive pulmonary disease, unspecified: Secondary | ICD-10-CM

## 2011-06-08 DIAGNOSIS — Z1159 Encounter for screening for other viral diseases: Secondary | ICD-10-CM | POA: Insufficient documentation

## 2011-06-08 DIAGNOSIS — R030 Elevated blood-pressure reading, without diagnosis of hypertension: Secondary | ICD-10-CM

## 2011-06-08 DIAGNOSIS — E7800 Pure hypercholesterolemia, unspecified: Secondary | ICD-10-CM

## 2011-06-08 DIAGNOSIS — R0989 Other specified symptoms and signs involving the circulatory and respiratory systems: Secondary | ICD-10-CM

## 2011-06-08 NOTE — Assessment & Plan Note (Signed)
Well controlled on current dose.

## 2011-06-08 NOTE — Patient Instructions (Addendum)
Recheck platelets in 1 months lab only visit.  Work on exercise, weight loss, healthy eating. Good work on quitting tobacco...keep it up! Follow up in 09/2011 for 30 min OV, sooner if new issues arising or SOB not continuing to improve.Marland Kitchen

## 2011-06-08 NOTE — Assessment & Plan Note (Signed)
PFTs nml, cardiac eval nml per cards.  HAd initially improved off ACEI but now with recent illness worsened. CXR clear at ER earlier in 2012. Lab eval shows no clear cause.. nml thyroid, nml cbc. May be due to deconditioning and smoking use and recent infection. Will given more time for recovery.. Follow up if not continuing to improve.

## 2011-06-08 NOTE — Assessment & Plan Note (Signed)
Well controlled o no medication at this point.

## 2011-06-08 NOTE — Progress Notes (Signed)
Subjective:    Patient ID: Tiffany Orozco, female    DOB: 1951-02-17, 61 y.o.   MRN: [id number]  HPI  61 year old female with history of recent CVA presents for wellness exam.  Seen for acute bronchitis in end of 05/2011 to 1/3... Gradually improving. Treated with Zpack and Tussionex Still headache,  Some cough, no shortness of breath, wheezing improved. Continued fatigue. Has stopped smoking since she has been sick PFTs were normal in 2012... No clear COPD.  Hypothyroid: Stable on levothyroxine.  Elevated Cholesterol: HDL low, but LDL almost at goal <70 on simvastatin. Using medications without problems:None Muscle aches: None Diet compliance: Moderate Exercise:Minimal since sick in last few weeks. Other complaints:    Review of Systems  Constitutional: Negative for fever, fatigue and unexpected weight change.  HENT: Negative for ear pain, congestion, sore throat, sneezing, trouble swallowing and sinus pressure.   Eyes: Negative for pain and itching.  Respiratory: Positive for cough. Negative for shortness of breath and wheezing.   Cardiovascular: Negative for chest pain, palpitations and leg swelling.  Gastrointestinal: Negative for nausea, abdominal pain, diarrhea, constipation and blood in stool.  Genitourinary: Negative for dysuria, hematuria, vaginal discharge, difficulty urinating and menstrual problem.  Skin: Negative for rash.  Neurological: Positive for headaches. Negative for syncope, weakness, light-headedness and numbness.  Psychiatric/Behavioral: Negative for confusion and dysphoric mood. The patient is not nervous/anxious.        Objective:   Physical Exam  Constitutional: Vital signs are normal. She appears well-developed and well-nourished. She is cooperative.  Non-toxic appearance. She does not appear ill. No distress.       overweight  HENT:  Head: Normocephalic.  Right Ear: Hearing, tympanic membrane, external ear and ear canal normal.  Left Ear:  Hearing, tympanic membrane, external ear and ear canal normal.  Nose: Nose normal.  Eyes: Conjunctivae, EOM and lids are normal. Pupils are equal, round, and reactive to light. No foreign bodies found.  Neck: Trachea normal and normal range of motion. Neck supple. Carotid bruit is not present. No mass and no thyromegaly present.  Cardiovascular: Normal rate, regular rhythm, S1 normal, S2 normal, normal heart sounds and intact distal pulses.  Exam reveals no gallop.   No murmur heard. Pulmonary/Chest: Effort normal and breath sounds normal. No respiratory distress. She has no wheezes. She has no rhonchi. She has no rales.  Abdominal: Soft. Normal appearance and bowel sounds are normal. She exhibits no distension, no fluid wave, no abdominal bruit and no mass. There is no hepatosplenomegaly. There is no tenderness. There is no rebound, no guarding and no CVA tenderness. No hernia.  Genitourinary: Vagina normal and uterus normal. No breast swelling, tenderness, discharge or bleeding. Pelvic exam was performed with patient prone. There is no rash, tenderness or lesion on the right labia. There is no rash, tenderness or lesion on the left labia. Uterus is not enlarged and not tender. Cervix exhibits no motion tenderness, no discharge and no friability. Right adnexum displays no mass, no tenderness and no fullness. Left adnexum displays no mass, no tenderness and no fullness.       Rectocele, no prolapse  Lymphadenopathy:    She has no cervical adenopathy.    She has no axillary adenopathy.  Neurological: She is alert. She has normal strength. No cranial nerve deficit or sensory deficit.  Skin: Skin is warm, dry and intact. No rash noted.  Psychiatric: She has a normal mood and affect. Her speech is normal and behavior is  normal. Judgment and thought content normal. Her mood appears not anxious. Cognition and memory are normal. She does not exhibit a depressed mood.          Assessment & Plan:    Complete Physical Exam: The patient's preventative maintenance and recommended screening tests for an annual wellness exam were reviewed in full today. Brought up to date unless services declined.  Counselled on the importance of diet, exercise, and its role in overall health and mortality. The patient's FH and SH was reviewed, including their home life, tobacco status, and drug and alcohol status.   Vaccines: Uptodate with flu, Td, shingles PAP/DVE:nml in 2010, every 2 years.. perform this year then if normal go to every 3 years.  Mammo:  nml 05/05/2011 DXA: osteopenia 03/2009.Marland Kitchen On recheck this year  Showed no significant change. Colon: 04/2010, Dr. Juanda Chance, nml, repeat in 10 years Smoker:quit since she has been sick.. Plans to stop altogether.

## 2011-06-08 NOTE — Assessment & Plan Note (Addendum)
Likely reactive as was high with recent bacterial bronchitits and pyelo. Previously no issues.  Will re-eval in 1 month.

## 2011-06-08 NOTE — Assessment & Plan Note (Signed)
LDL almost at goal <70 on current med. HAs not been taking fish oil  And limited activitywhile sick so may be why HDL is down some. Will restart as able.

## 2011-06-15 ENCOUNTER — Encounter: Payer: Self-pay | Admitting: *Deleted

## 2011-06-23 ENCOUNTER — Telehealth: Payer: Self-pay | Admitting: Cardiovascular Disease

## 2011-06-23 NOTE — Telephone Encounter (Signed)
New problem Pt said her platelets are high according to her pcp. Does she need to do anything? Please call her back

## 2011-06-25 NOTE — Telephone Encounter (Signed)
What research trial is she on??.  She is on Aggrenox so should not be hypercoagulable.  Should see hematology to discuss elevated PLT;s and see if bone marrow needed.  Dr Ermalene Searing ordered test should F/U

## 2011-06-25 NOTE — Telephone Encounter (Signed)
SPOKE WITH PT  HAS CONCERNS  RE ELEVATED PLATELETS  PMD ORDERED  AND HAS NOT DONE ANYTHING DIFFER ONLY  TO HAVE LABS REPEATED , PT MAIN CONCERN IS HAVING ANOTHER STROKE  INFORMED PT TO CONTACT NEURO. ALSO PT IS  INVOLVED WITH  RESEARCH PROGRAM   AFFILIATED WITH NEURO TOLD PT TO CONTACT  RESEARCH  TO FIND OUT WHAT  PROTOCOL   IS FOLLOWED   IN STUDY PT AWARE WILL FORWARD TO DR Eden Emms FOR REVIEW./CY

## 2011-06-28 ENCOUNTER — Other Ambulatory Visit (INDEPENDENT_AMBULATORY_CARE_PROVIDER_SITE_OTHER): Payer: BC Managed Care – PPO

## 2011-06-28 DIAGNOSIS — D696 Thrombocytopenia, unspecified: Secondary | ICD-10-CM

## 2011-06-28 LAB — CBC WITH DIFFERENTIAL/PLATELET
Basophils Absolute: 0.1 10*3/uL (ref 0.0–0.1)
Basophils Relative: 1 % (ref 0.0–3.0)
Eosinophils Absolute: 0.1 10*3/uL (ref 0.0–0.7)
Eosinophils Relative: 1.8 % (ref 0.0–5.0)
HCT: 40.6 % (ref 36.0–46.0)
Hemoglobin: 13.9 g/dL (ref 12.0–15.0)
Lymphocytes Relative: 32.4 % (ref 12.0–46.0)
Lymphs Abs: 2.5 10*3/uL (ref 0.7–4.0)
MCHC: 34.2 g/dL (ref 30.0–36.0)
MCV: 89 fl (ref 78.0–100.0)
Monocytes Absolute: 0.7 10*3/uL (ref 0.1–1.0)
Monocytes Relative: 8.8 % (ref 3.0–12.0)
Neutro Abs: 4.3 10*3/uL (ref 1.4–7.7)
Neutrophils Relative %: 56 % (ref 43.0–77.0)
Platelets: 358 10*3/uL (ref 150.0–400.0)
RBC: 4.56 Mil/uL (ref 3.87–5.11)
RDW: 14.4 % (ref 11.5–14.6)
WBC: 7.7 10*3/uL (ref 4.5–10.5)

## 2011-06-28 NOTE — Telephone Encounter (Signed)
I felt given pt recent infections.. There was a possiblity of reactive thrombocytosis (as discussed with pt at 06/2011 appt)... Was following over 1-3 months for resolution, but if pt wants to procede with hemetology referral, that would be the next step.  Heather, please call pt to find out if she wants referral.

## 2011-06-28 NOTE — Telephone Encounter (Signed)
Hemetology referral is the way patient wants to go.  But, would like to wait until after repeat blood work

## 2011-06-28 NOTE — Telephone Encounter (Signed)
Notify pt

## 2011-07-09 ENCOUNTER — Other Ambulatory Visit: Payer: BC Managed Care – PPO

## 2011-08-21 ENCOUNTER — Emergency Department (INDEPENDENT_AMBULATORY_CARE_PROVIDER_SITE_OTHER): Payer: BC Managed Care – PPO

## 2011-08-21 ENCOUNTER — Encounter (HOSPITAL_COMMUNITY): Payer: Self-pay

## 2011-08-21 ENCOUNTER — Emergency Department (INDEPENDENT_AMBULATORY_CARE_PROVIDER_SITE_OTHER)
Admission: EM | Admit: 2011-08-21 | Discharge: 2011-08-21 | Disposition: A | Discharge status: Home / Self Care | Payer: BC Managed Care – PPO | Source: Home / Self Care | Attending: Emergency Medicine | Admitting: Emergency Medicine

## 2011-08-21 DIAGNOSIS — S9032XA Contusion of left foot, initial encounter: Secondary | ICD-10-CM

## 2011-08-21 DIAGNOSIS — S9030XA Contusion of unspecified foot, initial encounter: Secondary | ICD-10-CM

## 2011-08-21 MED ORDER — MELOXICAM 7.5 MG PO TABS: 7.5000 mg | ORAL_TABLET | Freq: Every day | ORAL | Status: DC

## 2011-08-21 NOTE — Discharge Instructions (Signed)
Your x-rays today showed no fractures or dislocations have encouraged you to followup with your primary care Dr. or with Dr. if pain persists beyond 2 weeks to consider a second x-ray if pain was to continue or exacerbate   Contusion A contusion is a deep bruise. Contusions are the result of an injury that caused bleeding under the skin. The contusion may turn blue, purple, or yellow. Minor injuries will give you a painless contusion, but more severe contusions may stay painful and swollen for a few weeks.  CAUSES  A contusion is usually caused by a blow, trauma, or direct force to an area of the body. SYMPTOMS   Swelling and redness of the injured area.   Bruising of the injured area.   Tenderness and soreness of the injured area.   Pain.  DIAGNOSIS  The diagnosis can be made by taking a history and physical exam. An X-ray, CT scan, or MRI may be needed to determine if there were any associated injuries, such as fractures. TREATMENT  Specific treatment will depend on what area of the body was injured. In general, the best treatment for a contusion is resting, icing, elevating, and applying cold compresses to the injured area. Over-the-counter medicines may also be recommended for pain control. Ask your caregiver what the best treatment is for your contusion. HOME CARE INSTRUCTIONS   Put ice on the injured area.   Put ice in a plastic bag.   Place a towel between your skin and the bag.   Leave the ice on for 15 to 20 minutes, 3 to 4 times a day.   Only take over-the-counter or prescription medicines for pain, discomfort, or fever as directed by your caregiver. Your caregiver may recommend avoiding anti-inflammatory medicines (aspirin, ibuprofen, and naproxen) for 48 hours because these medicines may increase bruising.   Rest the injured area.   If possible, elevate the injured area to reduce swelling.  SEEK IMMEDIATE MEDICAL CARE IF:   You have increased bruising or swelling.     You have pain that is getting worse.   Your swelling or pain is not relieved with medicines.  MAKE SURE YOU:   Understand these instructions.   Will watch your condition.   Will get help right away if you are not doing well or get worse.  Document Released: 02/24/2005 Document Revised: 05/06/2011 Document Reviewed: 03/22/2011 [facility] Patient Information 2012 [place], [place].Contusion (Bruise) of Foot Injury to the foot causes bruises (contusions). Contusions are caused by bleeding from small blood vessels that allow blood to leak out into the muscles, cord-like structures that attach muscle to bone (tendons), and/or other soft tissue.  CAUSES  Contusions of the foot are common. Bruises are frequently seen from:  Contact sports injuries.   The use of medications that thin the blood (anti-coagulants).   Aspirin and non-steroidal anti-inflammatory agents that decrease the clotting ability.   People with vitamin deficiencies.  SYMPTOMS  Signs of foot injury include pain and swelling. At first there may be discoloration from blood under the skin. This will appear blue to purple in color. As the bruise ages, the color turns yellow. Swelling may limit the movement of the toes.  Complications from foot injury may include:  Collections of blood leading to disability if calcium deposits form. These can later limit movement in the foot.   Infection of the foot if there are breaks in the skin.   Rupture of the tendons that may need surgical repair.  DIAGNOSIS  Diagnosing foot injuries can be made by observation. If problems continue, X-rays may be needed to make sure there are no broken bones (fractures). Continuing problems may require physical therapy.  HOME CARE INSTRUCTIONS   Apply ice to the injury for 15 to 20 minutes, 3 to 4 times per day. Put the ice in a plastic bag and place a towel between the bag of ice and your skin.   An elastic wrap (like an Ace bandage) may be used to  keep swelling down.   Keep foot elevated to reduce swelling and discomfort.   Try to avoid standing or walking while the foot is painful. Do not resume use until instructed by your caregiver. Then begin use gradually. If pain develops, decrease use and continue the above measures. Gradually increase activities that do not cause discomfort until you slowly have normal use.   Only take over-the-counter or prescription medicines for pain, discomfort, or fever as directed by your caregiver. Use only if your caregiver has not given medications that would interfere.   Begin daily rehabilitation exercises when supportive wrapping is no longer needed.   Use ice massage for 10 minutes before and after workouts. Fill a large styrofoam cup with water and freeze. Tear a small amount of foam from the top so ice protrudes. Massage ice firmly over the injured area in a circle about the size of a softball.   Always eat a well balanced diet.   Follow all instructions for follow up with your caregiver, any orthopedic referrals, physical therapy and rehabilitation. Any delay in obtaining necessary care could result in delayed healing, and temporary or permanent disability.  SEEK IMMEDIATE MEDICAL CARE IF:   Your pain and swelling increase, or pain is uncontrolled with medications.   You have loss of feeling in your foot, or your foot turns cold or blue.   An oral temperature above 102 F (38.9 C) develops, not controlled by medication.   Your foot becomes warm to touch, or you have more pain with movement of your toes.   You have a foot contusion that does not improve in 1 or 2 days.   Skin is broken and signs of infection occur (drainage, increasing pain, fever, headache, muscle aches, dizziness or a general ill feeling).   You develop new, unexplained symptoms, or an increase of the symptoms that brought you to your caregiver.  MAKE SURE YOU:   Understand these instructions.   Will watch your  condition.   Will get help right away if you are not doing well or get worse.  Document Released: 03/08/2006 Document Revised: 05/06/2011 Document Reviewed: 04/20/2011 [facility] Patient Information 2012 [place], [place].

## 2011-08-21 NOTE — ED Provider Notes (Signed)
History     CSN: [id number]  Arrival date & time 08/21/11  1478   First MD Initiated Contact with Patient 08/21/11 1001      Chief Complaint  Patient presents with  . Foot Pain    (Consider location/radiation/quality/duration/timing/severity/associated sxs/prior treatment) HPI Comments: On Thursday patient was walking while she accidentally misstepped and part of her right foot felt that cystoscopy and stone she seem to have been backwards her toes. Have been applying ice and elevating her foot since and taking some Aleve with some relief.  The patient denies any numbness, tingling. The edge more when she walks or moves her toes points to the distal metatarsal region  Patient is a 61 y.o. female presenting with lower extremity pain. The history is provided by the patient.  Foot Pain This is a new problem. The current episode started 2 days ago. The problem occurs constantly. The problem has not changed since onset.Pertinent negatives include no abdominal pain. The symptoms are aggravated by walking. The symptoms are relieved by ice and rest.    Past Medical History  Diagnosis Date  . Anxiety   . Depression   . GERD (gastroesophageal reflux disease)   . Unspecified hypothyroidism   . Skin cancer of face   . Skin cancer of arm   . Allergy   . COPD (chronic obstructive pulmonary disease)   . Hyperlipidemia   . Hypertension   . Obesity   . DDD (degenerative disc disease), lumbar     mild  . Stroke 07/2010    Past Surgical History  Procedure Date  . Tubal ligation 1975  . Appendectomy   . Foot surgery 03/31/2009    R foot  . Emgs 7/04    atms and wrists neg  . Cholecystectomy 2004  . Foot surgery 2008, 2009, 2010    right foot     Family History  Problem Relation Age of Onset  . Skin cancer Father   . Hypertension Mother     History  Substance Use Topics  . Smoking status: Current Everyday Smoker -- 0.5 packs/day for 25 years    Types: Cigarettes  . Smokeless  tobacco: Never Used  . Alcohol Use: No    OB History    Grav Para Term Preterm Abortions TAB SAB Ect Mult Living                  Review of Systems  Constitutional: Negative for fever.  Gastrointestinal: Negative for abdominal pain.  Skin: Negative for rash and wound.  Neurological: Negative for weakness and numbness.    Allergies  Lisinopril; Niacin; Codeine; and Oxycontin  Home Medications   Current Outpatient Rx  Name Route Sig Dispense Refill  . AGGRENOX 25-200 MG PO CP12  TAKE 1 CAPSULE BY MOUTH 2 TIMES DAILY. 180 capsule 1  . ALBUTEROL 90 MCG/ACT IN AERS Inhalation Inhale 2 puffs into the lungs every 6 (six) hours as needed for wheezing. 17 g 3  . AMLODIPINE BESYLATE 10 MG PO TABS Oral Take 1 tablet (10 mg total) by mouth daily. 30 tablet 11  . ASPIRIN-CAFFEINE 845-65 MG PO PACK Oral Take 1 packet by mouth as needed.      Marland Kitchen CETIRIZINE HCL 10 MG PO TABS Oral Take 1 tablet (10 mg total) by mouth daily. 90 tablet 3  . VITAMIN D 1000 UNITS PO CAPS Oral Take 1,000 Units by mouth daily. Take 2 (1000 unit) caps by mouth daily     . COENZYME  Q10 30 MG PO CAPS Oral Take 30 mg by mouth daily.      Marland Kitchen LEVOTHYROXINE SODIUM 125 MCG PO TABS Oral Take 1 tablet (125 mcg total) by mouth daily. 90 tablet 3  . MELOXICAM 7.5 MG PO TABS Oral Take 1 tablet (7.5 mg total) by mouth daily. 14 tablet 0  . FISH OIL 1000 MG PO CAPS Oral Take by mouth. 3 by mouth daily     . PANTOPRAZOLE SODIUM 40 MG PO TBEC  TAKE 1 TABLET BY MOUTH ONCE A DAY 90 tablet 0  . SIMVASTATIN 20 MG PO TABS  TAKE 1 TABLET EVERY DAY 90 tablet 1  . STUDY MEDICATION  as directed. Stroke medicine Actos  3 tabs po qd    . VITAMIN E 400 UNITS PO CAPS Oral Take 400 Units by mouth daily.        BP 120/52  Pulse 99  Temp(Src) 98.5 F (36.9 C) (Oral)  Resp 17  SpO2 97%  Physical Exam  Nursing note and vitals reviewed. Constitutional: She appears well-developed and well-nourished.  HENT:  Head: Normocephalic.  Eyes:  Conjunctivae are normal.  Neck: Neck supple.  Musculoskeletal:       Right ankle: She exhibits normal range of motion, no swelling, no ecchymosis, no laceration and normal pulse. tenderness. No medial malleolus tenderness found. Achilles tendon normal.       Feet:  Neurological: No cranial nerve deficit. She exhibits normal muscle tone.  Skin: No rash noted. No erythema.    ED Course  Procedures (including critical care time)  Labs Reviewed - No data to display Dg Foot Complete Left  08/21/2011  *RADIOLOGY REPORT*  Clinical Data: Injured left foot 2 days ago, pain localizing to the great toe and the region of the first and second not tarsals.  LEFT FOOT - COMPLETE 3+ VIEW 08/21/2011:  Comparison: None.  Findings: No evidence of acute or subacute fracture or dislocation. Joint space narrowing involving IP joints of multiple toes.  Joint space narrowing involving the first MTP joint.  No associated erosions.  Small plantar calcaneal spur.  Enthesopathy/spur at the insertion of the Achilles tendon on the posterior calcaneus.  IMPRESSION: No acute or subacute osseous abnormality.  Osteoarthritis involving the IP joints of multiple toes and the first MTP joint.  Small plantar calcaneal spur.  Original Report Authenticated By: Arnell Sieving, M.D.     1. Contusion of foot, left       MDM  Foot sprain mainly dorsal aspect of the midfoot area and distal metatarsal area. No significant soft tissue swelling, ecchymosis, no sensorial deficits, were noted on exam x-rays were negative for fractures or dislocations symptomatic management was recommended to followup in 2 weeks if pain were to persist        Jimmie Molly, MD 08/21/11 1456

## 2011-08-21 NOTE — ED Notes (Signed)
On Thursday, patient was walking when she took a step and part of her left foot touch the stepping stone and the rest did not. Pt states that her toes bent backward. Pt applied ice and elevated. Pt took aleve with some relief. No bruising noted. Pt states that top of foot and big toe hurts with anything that touches it.

## 2011-08-25 ENCOUNTER — Telehealth: Payer: Self-pay | Admitting: Family Medicine

## 2011-08-25 DIAGNOSIS — N816 Rectocele: Secondary | ICD-10-CM

## 2011-08-25 NOTE — Telephone Encounter (Signed)
Pt is calling about a referral to a GYN Dr. For  "Back wall of her Uterus Collapsing"? She said that this was discussed back at the last visit she had in January but I could not find the referral in her Chart and I looked back at the last office note. She says she needs someone internal because she gets assistance from [facility] with her bills. She could use [facility] ??

## 2011-08-29 DIAGNOSIS — N816 Rectocele: Secondary | ICD-10-CM | POA: Insufficient documentation

## 2011-08-30 ENCOUNTER — Other Ambulatory Visit: Payer: Self-pay | Admitting: Family Medicine

## 2011-09-06 ENCOUNTER — Ambulatory Visit (INDEPENDENT_AMBULATORY_CARE_PROVIDER_SITE_OTHER): Payer: BC Managed Care – PPO | Admitting: Obstetrics & Gynecology

## 2011-09-06 ENCOUNTER — Encounter: Payer: Self-pay | Admitting: Obstetrics & Gynecology

## 2011-09-06 VITALS — BP 132/76 | HR 80 | Ht 60.0 in | Wt 179.0 lb

## 2011-09-06 DIAGNOSIS — N814 Uterovaginal prolapse, unspecified: Secondary | ICD-10-CM

## 2011-09-06 DIAGNOSIS — R350 Frequency of micturition: Secondary | ICD-10-CM

## 2011-09-06 MED ORDER — ESTROGENS, CONJUGATED 0.625 MG/GM VA CREA: TOPICAL_CREAM | VAGINAL | Status: DC

## 2011-09-06 NOTE — Progress Notes (Signed)
  Subjective:    Patient ID: Tiffany Orozco, female    DOB: 09-07-1950, 61 y.o.   MRN: [id number]  HPI  Tiffany Orozco is a 61 yo MW G5P2A3 who is here for evaluation of prolapse issues. She is sexually active and has had a year's h/o "discomfort" with sex, dryness and "pressure".  She also gives a history of urinary frequency for about a year, "since my stroke". She reports some LBP, especially with defecation, and some issues with constipation.  Review of Systems    pap reportedly normal last month Objective:   Physical Exam  Moderate vaginal atrophy 2nd degree uterine prolapse 2nd degree cystocele and rectocele      Assessment & Plan:  Symptomatic prolapse- rec vaginal estrogen to be followed by a pessary versus TVH, A&P repair Urinary frequency- check urine culture

## 2011-09-08 LAB — URINE CULTURE: Colony Count: 3000

## 2011-10-06 ENCOUNTER — Ambulatory Visit (INDEPENDENT_AMBULATORY_CARE_PROVIDER_SITE_OTHER): Payer: BC Managed Care – PPO | Admitting: Obstetrics & Gynecology

## 2011-10-06 ENCOUNTER — Encounter: Payer: Self-pay | Admitting: Obstetrics & Gynecology

## 2011-10-06 VITALS — BP 118/74 | HR 81 | Ht 60.0 in | Wt 179.0 lb

## 2011-10-06 DIAGNOSIS — N814 Uterovaginal prolapse, unspecified: Secondary | ICD-10-CM

## 2011-10-06 NOTE — Progress Notes (Signed)
  Subjective:    Patient ID: Tiffany Orozco, female    DOB: 24-Jan-1951, 61 y.o.   MRN: [id number]  HPI  Tiffany Orozco is here because she has thought about the pessary versus having a LAVH/BSO/anterior and posterior repair. She would like to have surgical repair, in August.  Review of Systems     Objective:   Physical Exam        Assessment & Plan:  I will schedule surgery per her wishes. She understands that she needs to continue to use the vaginal estrogen every other night from now until 2 months post op.

## 2011-10-07 ENCOUNTER — Telehealth: Payer: Self-pay | Admitting: Family Medicine

## 2011-10-07 ENCOUNTER — Encounter: Payer: Self-pay | Admitting: Family Medicine

## 2011-10-07 ENCOUNTER — Ambulatory Visit (INDEPENDENT_AMBULATORY_CARE_PROVIDER_SITE_OTHER): Payer: BC Managed Care – PPO | Admitting: Family Medicine

## 2011-10-07 VITALS — BP 120/80 | HR 85 | Temp 98.4°F | Ht 61.0 in | Wt 178.0 lb

## 2011-10-07 DIAGNOSIS — J309 Allergic rhinitis, unspecified: Secondary | ICD-10-CM

## 2011-10-07 DIAGNOSIS — K047 Periapical abscess without sinus: Secondary | ICD-10-CM | POA: Insufficient documentation

## 2011-10-07 DIAGNOSIS — F329 Major depressive disorder, single episode, unspecified: Secondary | ICD-10-CM

## 2011-10-07 DIAGNOSIS — R030 Elevated blood-pressure reading, without diagnosis of hypertension: Secondary | ICD-10-CM

## 2011-10-07 DIAGNOSIS — D696 Thrombocytopenia, unspecified: Secondary | ICD-10-CM

## 2011-10-07 DIAGNOSIS — R5381 Other malaise: Secondary | ICD-10-CM

## 2011-10-07 DIAGNOSIS — R5383 Other fatigue: Secondary | ICD-10-CM | POA: Insufficient documentation

## 2011-10-07 DIAGNOSIS — E039 Hypothyroidism, unspecified: Secondary | ICD-10-CM

## 2011-10-07 DIAGNOSIS — E7800 Pure hypercholesterolemia, unspecified: Secondary | ICD-10-CM

## 2011-10-07 MED ORDER — AMOXICILLIN-POT CLAVULANATE 875-125 MG PO TABS: 1.0000 | ORAL_TABLET | Freq: Two times a day (BID) | ORAL | Status: AC

## 2011-10-07 MED ORDER — SYNTHROID 125 MCG PO TABS: 125.0000 ug | ORAL_TABLET | Freq: Every day | ORAL | Status: DC

## 2011-10-07 NOTE — Assessment & Plan Note (Signed)
Due for re-eval especially given recent nonspecific symptoms of fatigue and ill feeling.  Previosly felt to be reactive given recent infections.

## 2011-10-07 NOTE — Assessment & Plan Note (Signed)
Will reeval 

## 2011-10-07 NOTE — Assessment & Plan Note (Signed)
Use allergy medication daily. Nasal saline irrigation. May be cause of fatigue.

## 2011-10-07 NOTE — Telephone Encounter (Signed)
aller: Tiffany Orozco/Patient; PCP: Kerby Nora E.; CB#: [phone]; ; ; Call regarding Urinary Tract Infection;  [name] states she was see in office on 10/07/11 and was told she had an infection. Was ordered Amoxicillin States she had sore jaw  and tender gums after a tooth crown came off last week. States she has sinus irritation. States her platelets are high. Does not know what type of infection she has.  States she had a UTI in the past which made her very sick. States she urinates frequently. [name] states she has labwork ordered for 10/07/11 .Suggesting she be checked for UTI and labs be ordered for UTI  in AM.

## 2011-10-07 NOTE — Assessment & Plan Note (Signed)
Treat with antibiotics and keep appt with dentist for likely extraction. If extraction hold aggrenox for one day prior and day of.

## 2011-10-07 NOTE — Progress Notes (Signed)
  Subjective:    Patient ID: Tiffany Orozco, female    DOB: Aug 05, 1950, 61 y.o.   MRN: [id number]  HPI  Elevated Cholesterol: due for re-eval on simvastatin Using medications without problems:None  Muscle aches: None  Diet compliance: Moderate  Exercise:Minimal since sick in last few weeks.  Other complaints:  Lost crown in bottom right teeth. Tender in teeth. Headache on that side. Feels tired no energy. Nasal congestion, coughing up some yellow mucus. Right maxillary pressure.  No SOB.  Ears pop.  Taking cetirizine.  Wishes to change back to brand...feels worse on generic. Lab Results  Component Value Date   TSH 0.68 06/02/2011      Scheduled for cystocele, rectocele, uterine prolapse surgery... Plan vaginal hysterectomy in August.   Thrombocytopenia:  Elevated 2 times in a row...thought to be reactive, but given nonspecific.. Achy, fatigue changes... Will recheck.   Review of Systems  Constitutional: Positive for fatigue. Negative for fever.  HENT: Positive for congestion, dental problem and postnasal drip. Negative for ear pain.   Eyes: Negative for pain.  Respiratory: Negative for chest tightness and shortness of breath.   Cardiovascular: Negative for chest pain, palpitations and leg swelling.  Gastrointestinal: Negative for abdominal pain.  Genitourinary: Negative for dysuria.       Objective:   Physical Exam  Constitutional: Vital signs are normal. She appears well-developed and well-nourished. She is cooperative.  Non-toxic appearance. She does not appear ill. No distress.  HENT:  Head: Normocephalic.  Right Ear: Hearing, tympanic membrane, external ear and ear canal normal. Tympanic membrane is not erythematous, not retracted and not bulging.  Left Ear: Hearing, tympanic membrane, external ear and ear canal normal. Tympanic membrane is not erythematous, not retracted and not bulging.  Nose: No mucosal edema or rhinorrhea. Right sinus exhibits no maxillary  sinus tenderness and no frontal sinus tenderness. Left sinus exhibits no maxillary sinus tenderness and no frontal sinus tenderness.  Mouth/Throat: Uvula is midline, oropharynx is clear and moist and mucous membranes are normal. Abnormal dentition. Dental abscesses and dental caries present.       ttp to gum on right inferior, no definite fluctuation, but redness at gums  Eyes: Conjunctivae, EOM and lids are normal. Pupils are equal, round, and reactive to light. No foreign bodies found.  Neck: Trachea normal and normal range of motion. Neck supple. Carotid bruit is not present. No mass and no thyromegaly present.  Cardiovascular: Normal rate, regular rhythm, S1 normal, S2 normal, normal heart sounds, intact distal pulses and normal pulses.  Exam reveals no gallop and no friction rub.   No murmur heard. Pulmonary/Chest: Effort normal and breath sounds normal. Not tachypneic. No respiratory distress. She has no decreased breath sounds. She has no wheezes. She has no rhonchi. She has no rales.  Abdominal: Soft. Normal appearance and bowel sounds are normal. There is no tenderness.  Neurological: She is alert.  Skin: Skin is warm, dry and intact. No rash noted.  Psychiatric: Her speech is normal and behavior is normal. Judgment and thought content normal. Her mood appears not anxious. Cognition and memory are normal. She does not exhibit a depressed mood.          Assessment & Plan:

## 2011-10-07 NOTE — Patient Instructions (Addendum)
Return for fasting labs in next days.  Start antibiotic for possible tooth abccess, complete course.  See dentist within next week.  Plan follow up in 6 months for chronic issues.

## 2011-10-07 NOTE — Telephone Encounter (Signed)
Pt diagnosed with dental abccess today as discussed in detail with pt.   Given urinary frequency and pt history, since seen today... Have pt drop off clean catch urine sample in AM to eval for UTI.

## 2011-10-07 NOTE — Assessment & Plan Note (Signed)
Evaluate with labs. May be due to medication SE and or current oral infection. ? Control of depression.. Per pt this may be some of the issue but she denies severe mood changes. Consider restarting antidepressant if not improving.

## 2011-10-07 NOTE — Assessment & Plan Note (Signed)
this may be some of the issue but she denies severe mood changes. Consider restarting antidepressant if not improving.

## 2011-10-07 NOTE — Telephone Encounter (Signed)
Patient advised.

## 2011-10-08 ENCOUNTER — Ambulatory Visit (INDEPENDENT_AMBULATORY_CARE_PROVIDER_SITE_OTHER): Payer: BC Managed Care – PPO | Admitting: Family Medicine

## 2011-10-08 ENCOUNTER — Other Ambulatory Visit (INDEPENDENT_AMBULATORY_CARE_PROVIDER_SITE_OTHER): Payer: BC Managed Care – PPO

## 2011-10-08 DIAGNOSIS — R5381 Other malaise: Secondary | ICD-10-CM

## 2011-10-08 DIAGNOSIS — E7800 Pure hypercholesterolemia, unspecified: Secondary | ICD-10-CM

## 2011-10-08 DIAGNOSIS — R5383 Other fatigue: Secondary | ICD-10-CM

## 2011-10-08 DIAGNOSIS — R350 Frequency of micturition: Secondary | ICD-10-CM

## 2011-10-08 LAB — POCT URINALYSIS DIPSTICK
Bilirubin, UA: NEGATIVE
Glucose, UA: NEGATIVE
Ketones, UA: NEGATIVE
Leukocytes, UA: NEGATIVE
Nitrite, UA: NEGATIVE
Protein, UA: NEGATIVE
Spec Grav, UA: 1.01
Urobilinogen, UA: NEGATIVE
pH, UA: 7

## 2011-10-08 LAB — COMPREHENSIVE METABOLIC PANEL
ALT: 17 U/L (ref 0–35)
AST: 17 U/L (ref 0–37)
Albumin: 3.9 g/dL (ref 3.5–5.2)
Alkaline Phosphatase: 92 U/L (ref 39–117)
BUN: 8 mg/dL (ref 6–23)
CO2: 24 mEq/L (ref 19–32)
Calcium: 8.8 mg/dL (ref 8.4–10.5)
Chloride: 103 mEq/L (ref 96–112)
Creatinine, Ser: 0.8 mg/dL (ref 0.4–1.2)
GFR: 81.7 mL/min (ref >60.00)
Glucose, Bld: 89 mg/dL (ref 70–99)
Potassium: 4.1 mEq/L (ref 3.5–5.1)
Sodium: 137 mEq/L (ref 135–145)
Total Bilirubin: 0.5 mg/dL (ref 0.3–1.2)
Total Protein: 7.4 g/dL (ref 6.0–8.3)

## 2011-10-08 LAB — CBC WITH DIFFERENTIAL/PLATELET
Basophils Absolute: 0.1 10*3/uL (ref 0.0–0.1)
Basophils Relative: 1.1 % (ref 0.0–3.0)
Eosinophils Absolute: 0.2 10*3/uL (ref 0.0–0.7)
Eosinophils Relative: 2.2 % (ref 0.0–5.0)
HCT: 42.5 % (ref 36.0–46.0)
Hemoglobin: 13.9 g/dL (ref 12.0–15.0)
Lymphocytes Relative: 22.4 % (ref 12.0–46.0)
Lymphs Abs: 1.9 10*3/uL (ref 0.7–4.0)
MCHC: 32.6 g/dL (ref 30.0–36.0)
MCV: 89.8 fl (ref 78.0–100.0)
Monocytes Absolute: 0.7 10*3/uL (ref 0.1–1.0)
Monocytes Relative: 8.4 % (ref 3.0–12.0)
Neutro Abs: 5.5 10*3/uL (ref 1.4–7.7)
Neutrophils Relative %: 65.9 % (ref 43.0–77.0)
Platelets: 373 10*3/uL (ref 150.0–400.0)
RBC: 4.73 Mil/uL (ref 3.87–5.11)
RDW: 14.4 % (ref 11.5–14.6)
WBC: 8.3 10*3/uL (ref 4.5–10.5)

## 2011-10-08 LAB — LIPID PANEL
Cholesterol: 144 mg/dL (ref 0–200)
HDL: 31.5 mg/dL — ABNORMAL LOW (ref >39.00)
LDL Cholesterol: 87 mg/dL (ref 0–99)
Total CHOL/HDL Ratio: 5
Triglycerides: 130 mg/dL (ref 0.0–149.0)
VLDL: 26 mg/dL (ref 0.0–40.0)

## 2011-10-08 LAB — TSH: TSH: 0.93 u[IU]/mL (ref 0.35–5.50)

## 2011-10-08 MED ORDER — CIPROFLOXACIN HCL 500 MG PO TABS: 500.0000 mg | ORAL_TABLET | Freq: Two times a day (BID) | ORAL | Status: AC

## 2011-10-08 NOTE — Telephone Encounter (Addendum)
UA shows blood, otherwise negative. Pt with history of pyelonephritis. Will treat emperically as UTI... May be causing symptoms. Send urine for culture. Discard spun urine.. No need for micro. Have her stop amox and add to allergy/intolerance list.  Start cipro 500 mg BID x 7 days.. Call if N/V continuing and not tolerating antibiotics or fever on antibiotics.  Cipro should cover oral infection as well.

## 2011-10-08 NOTE — Telephone Encounter (Signed)
Patient advised  Amox add to allergy

## 2011-10-08 NOTE — Telephone Encounter (Signed)
Pt gave clean catch urine this AM; pt said frequent urination, low back pain on lt side and lower abdominal soreness. No burning upon urination. Pt  temp usually 97 and today 98.3. Pt took Amoxicillin 2 hours after eaten yesterday and pt vomited x 4; last vomited 7:30 pm last night. Pt had diarrhea x 1 this AM.Pt added throat is raw from vomiting, and also red area under  both eyes  after vomiting. Pt uses [facility] and pt can be reached [phone].

## 2011-10-08 NOTE — Telephone Encounter (Signed)
Addended byKerby Nora E on: 10/08/2011 01:04 PM   Modules accepted: Orders

## 2011-10-11 NOTE — Progress Notes (Signed)
  Subjective:    Patient ID: Tiffany Orozco, female    DOB: 22-May-1951, 61 y.o.   MRN: [id number]  HPI  Pt dropped off urine to evaluate for urinary urgency.  See phone note for details of symptoms.  Review of Systems     Objective:   Physical Exam        Assessment & Plan:

## 2011-10-15 ENCOUNTER — Other Ambulatory Visit: Payer: Self-pay | Admitting: Family Medicine

## 2011-10-29 ENCOUNTER — Other Ambulatory Visit: Payer: Self-pay | Admitting: Family Medicine

## 2011-11-26 ENCOUNTER — Emergency Department (HOSPITAL_COMMUNITY)
Admission: EM | Admit: 2011-11-26 | Discharge: 2011-11-27 | Disposition: A | Discharge status: Home / Self Care | Payer: BC Managed Care – PPO | Attending: Emergency Medicine | Admitting: Emergency Medicine

## 2011-11-26 ENCOUNTER — Emergency Department (HOSPITAL_COMMUNITY): Payer: BC Managed Care – PPO

## 2011-11-26 ENCOUNTER — Encounter (HOSPITAL_COMMUNITY): Payer: Self-pay | Admitting: Emergency Medicine

## 2011-11-26 DIAGNOSIS — J449 Chronic obstructive pulmonary disease, unspecified: Secondary | ICD-10-CM | POA: Insufficient documentation

## 2011-11-26 DIAGNOSIS — F17200 Nicotine dependence, unspecified, uncomplicated: Secondary | ICD-10-CM | POA: Insufficient documentation

## 2011-11-26 DIAGNOSIS — E039 Hypothyroidism, unspecified: Secondary | ICD-10-CM | POA: Insufficient documentation

## 2011-11-26 DIAGNOSIS — J4489 Other specified chronic obstructive pulmonary disease: Secondary | ICD-10-CM | POA: Insufficient documentation

## 2011-11-26 DIAGNOSIS — F341 Dysthymic disorder: Secondary | ICD-10-CM | POA: Insufficient documentation

## 2011-11-26 DIAGNOSIS — I10 Essential (primary) hypertension: Secondary | ICD-10-CM | POA: Insufficient documentation

## 2011-11-26 DIAGNOSIS — R509 Fever, unspecified: Secondary | ICD-10-CM | POA: Insufficient documentation

## 2011-11-26 DIAGNOSIS — R5381 Other malaise: Secondary | ICD-10-CM | POA: Insufficient documentation

## 2011-11-26 DIAGNOSIS — Z8673 Personal history of transient ischemic attack (TIA), and cerebral infarction without residual deficits: Secondary | ICD-10-CM | POA: Insufficient documentation

## 2011-11-26 DIAGNOSIS — R5383 Other fatigue: Secondary | ICD-10-CM

## 2011-11-26 DIAGNOSIS — Z79899 Other long term (current) drug therapy: Secondary | ICD-10-CM | POA: Insufficient documentation

## 2011-11-26 DIAGNOSIS — K219 Gastro-esophageal reflux disease without esophagitis: Secondary | ICD-10-CM | POA: Insufficient documentation

## 2011-11-26 LAB — URINALYSIS, ROUTINE W REFLEX MICROSCOPIC
Glucose, UA: NEGATIVE mg/dL
Ketones, ur: 15 mg/dL — AB
Leukocytes, UA: NEGATIVE
Nitrite: NEGATIVE
Protein, ur: NEGATIVE mg/dL
Specific Gravity, Urine: 1.023 (ref 1.005–1.030)
Urobilinogen, UA: 0.2 mg/dL (ref 0.0–1.0)
pH: 6 (ref 5.0–8.0)

## 2011-11-26 LAB — BASIC METABOLIC PANEL
BUN: 9 mg/dL (ref 6–23)
CO2: 20 mEq/L (ref 19–32)
Calcium: 8.9 mg/dL (ref 8.4–10.5)
Chloride: 96 mEq/L (ref 96–112)
Creatinine, Ser: 0.62 mg/dL (ref 0.50–1.10)
GFR calc Af Amer: >90 mL/min (ref >90)
GFR calc non Af Amer: >90 mL/min (ref >90)
Glucose, Bld: 96 mg/dL (ref 70–99)
Potassium: 3.4 mEq/L — ABNORMAL LOW (ref 3.5–5.1)
Sodium: 130 mEq/L — ABNORMAL LOW (ref 135–145)

## 2011-11-26 LAB — URINE MICROSCOPIC-ADD ON

## 2011-11-26 LAB — CBC
HCT: 39.1 % (ref 36.0–46.0)
Hemoglobin: 13.4 g/dL (ref 12.0–15.0)
MCH: 28.9 pg (ref 26.0–34.0)
MCHC: 34.3 g/dL (ref 30.0–36.0)
MCV: 84.4 fL (ref 78.0–100.0)
Platelets: 352 10*3/uL (ref 150–400)
RBC: 4.63 MIL/uL (ref 3.87–5.11)
RDW: 13.9 % (ref 11.5–15.5)
WBC: 10.3 10*3/uL (ref 4.0–10.5)

## 2011-11-26 LAB — GLUCOSE, CAPILLARY: Glucose-Capillary: 98 mg/dL (ref 70–99)

## 2011-11-26 MED ORDER — KETOROLAC TROMETHAMINE 15 MG/ML IJ SOLN
15.0000 mg | Freq: Once | INTRAMUSCULAR | Status: DC
  Filled 2011-11-26: qty 1

## 2011-11-26 NOTE — ED Notes (Signed)
CBG 98 Rn notified Clydie Braun

## 2011-11-26 NOTE — ED Notes (Signed)
Reported wait time.  Blankets offered 

## 2011-11-26 NOTE — ED Notes (Signed)
Pt. Wants to also be checked for possible kidney issues. Pt. States her back is hurting.

## 2011-11-26 NOTE — ED Notes (Signed)
Pt c/o generalized weakness, nausea, and feeling tired since waking up this morning.

## 2011-11-26 NOTE — ED Provider Notes (Signed)
History     CSN: [id number]  Arrival date & time 11/26/11  1742   First MD Initiated Contact with Patient 11/26/11 2242      Chief Complaint  Patient presents with  . Weakness    (Consider location/radiation/quality/duration/timing/severity/associated sxs/prior treatment) HPI  61 year old female past medical history of anxiety, depression, GERD, COPD, stroke, degenerative disc disease, presents today with a 12 hour history of malaise, fever to 102 at home, mild headache, left flank pain, mild nausea without vomiting, 6 loose stools. She says she has a history of urinary tract infection. She denies any chest pain or frank shortness of breath. Denies any arm or jaw pain. Denies any joint pain tick exposure or rashes. She calls her illness moderate. Nothing is made better or worse. On arrival temperature 99.4, pulse 1:15, respiration 24, blood pressure 134/71, SpO2 97% on room air.   Past Medical History  Diagnosis Date  . Anxiety   . Depression   . GERD (gastroesophageal reflux disease)   . Unspecified hypothyroidism   . Skin cancer of face   . Skin cancer of arm   . Allergy   . COPD (chronic obstructive pulmonary disease)   . Hyperlipidemia   . Hypertension   . Obesity   . DDD (degenerative disc disease), lumbar     mild  . Stroke 07/2010    Past Surgical History  Procedure Date  . Tubal ligation 1975  . Appendectomy   . Foot surgery 03/31/2009    R foot  . Emgs 7/04    atms and wrists neg  . Cholecystectomy 2004  . Foot surgery 2008, 2009, 2010    right foot     Family History  Problem Relation Age of Onset  . Skin cancer Father   . Hypertension Mother     History  Substance Use Topics  . Smoking status: Current Everyday Smoker -- 0.5 packs/day for 25 years    Types: Cigarettes  . Smokeless tobacco: Never Used  . Alcohol Use: No    OB History    Grav Para Term Preterm Abortions TAB SAB Ect Mult Living   5 2   3  3   3       Review of  Systems Constitutional: POS for fever and chills.  HENT: Negative for ear pain, sore throat and trouble swallowing.   Eyes: Negative for pain and visual disturbance.  Respiratory: Negative for mild cough and an neg shortness of breath.   Cardiovascular: Negative for chest pain and leg swelling.  Gastrointestinal: POS for nausea, neg vomiting, abdominal pain and diarrhea.  Genitourinary: Negative for dysuria, urgency and frequency.  Musculoskeletal: POS for back pain and joint swelling.  Skin: Negative for rash and wound.  Neurological: Negative for dizziness, syncope, speech difficulty, weakness and numbness.  POS fatigue.   Allergies  Lisinopril; Amoxicillin; Niacin; Codeine; and Oxycodone hcl er  Home Medications   Current Outpatient Rx  Name Route Sig Dispense Refill  . AGGRENOX 25-200 MG PO CP12  TAKE 1 CAPSULE BY MOUTH 2 TIMES DAILY. 180 capsule 7  . AMLODIPINE BESYLATE 10 MG PO TABS Oral Take 1 tablet (10 mg total) by mouth daily. 30 tablet 11  . CETIRIZINE HCL 10 MG PO TABS Oral Take 1 tablet (10 mg total) by mouth daily. 90 tablet 3  . VITAMIN D 1000 UNITS PO CAPS Oral Take 1,000 Units by mouth daily. Take 2 (1000 unit) caps by mouth daily     . LEVOTHYROXINE SODIUM 125  MCG PO TABS  TAKE 1 TABLET BY MOUTH ONCE A DAY 90 tablet 3  . PANTOPRAZOLE SODIUM 40 MG PO TBEC  TAKE 1 TABLET BY MOUTH ONCE A DAY 90 tablet 0  . SIMVASTATIN 20 MG PO TABS  TAKE 1 TABLET EVERY DAY 90 tablet 1  . STUDY MEDICATION  as directed. Stroke medicine Actos  3 tabs po qd      BP 120/65  Pulse 92  Temp 99.7 F (37.6 C) (Oral)  Resp 16  SpO2 98%  Physical Exam Consitutional: Pt in no acute distress.   Head: Normocephalic and atraumatic.  Eyes: Extraocular motion intact, no scleral icterus Neck: Supple without meningismus, mass, or overt JVD Respiratory: Effort normal and breath sounds normal. No respiratory distress. CV: Heart regular rate and tachycardic (sinus), no obvious murmurs.  Pulses  +2 and symmetric Abdomen: Soft, non-tender, non-distended. No rebound or guarding.  MSK: Extremities are atraumatic without deformity, ROM intact.  Spine is tender midthoracic CVA tenderness right greater than left, moderate Skin: Warm, dry, intact Neuro: Alert and oriented, no motor deficit noted.  Patient moves all 4 extremities with ease. There is no facial droop. Eyes are Perl.  Extraocular motion intact. Psychiatric: Mood and affect are normal  EKG:  Rate: 111 Rythym Sinus tach Interval 164  ms. Axis: normal No gross conduction abnormalities appreciated.  No gross ST or T-wave abnormalities appreciated.  No previous.    ED Course  Procedures (including critical care time)  Labs Reviewed  BASIC METABOLIC PANEL - Abnormal; Notable for the following:    Sodium 130 (*)     Potassium 3.4 (*)     All other components within normal limits  URINALYSIS, ROUTINE W REFLEX MICROSCOPIC - Abnormal; Notable for the following:    APPearance CLOUDY (*)     Hgb urine dipstick MODERATE (*)     Bilirubin Urine SMALL (*)     Ketones, ur 15 (*)     All other components within normal limits  URINE MICROSCOPIC-ADD ON - Abnormal; Notable for the following:    Squamous Epithelial / LPF FEW (*)     All other components within normal limits  CBC  GLUCOSE, CAPILLARY   Dg Chest 2 View  11/26/2011  *RADIOLOGY REPORT*  Clinical Data: Weakness, shortness of breath, smoker, cough, history COPD, hypertension, stroke  CHEST - 2 VIEW  Comparison: 03/19/2011  Findings: Borderline enlargement of cardiac silhouette. Prominent fat pad at right cardiophrenic angle stable. Mediastinal contours and pulmonary vascularity otherwise normal. Minimal chronic peribronchial thickening. No pulmonary infiltrate, pleural effusion or pneumothorax. No acute osseous findings.  IMPRESSION: No acute abnormalities. Borderline enlargement of cardiac silhouette.  Original Report Authenticated By: Lollie Marrow, M.D.     1. Fatigue    2. Fever       MDM   Unclear etiology of the patient's symptoms. She does report a true fever, and sort of acute or symptoms the day do suggest infectious etiology (given her fever, CVA tenderness, history of urinary tract infections). However her urine is clean when I saw the patient.  She has no upper respiratory symptoms to suggest pneumonia.  Chest x-ray clear.  Patient denies exposure to the outdoors anytime recently, and both she and her daughter are adamant that they check for takes religiously. After by mouth fluids, her tachycardia has resolved. Manually her rate was 88 for discharge. She's feeling generally better after Motrin.  She can followup with her doctor on Monday. Patient discharged home to  follow up on Monday with primary care with strict return precautions for changes in her condition.  PT DC home stable.  Discussed with pt the clinical impression, treatment in the ED, and follow up plan.  We alslo discussed the indications for returning to the ED, which include shortness or breath, confusion, fever, new weakness or numbness, chest pain, or any other concerning symptom. The pt understood the treatment and plan, is stable, and is able to leave the ED.      Larrie Kass, MD 11/27/11 0106  Larrie Kass, MD 11/27/11 [phone]

## 2011-11-26 NOTE — ED Notes (Signed)
Pt states that she had a Stoke last year and was given TPA. Pt states in Jan. She had similar symptoms and ended up having a bladder infection. Pt states that she has been running a fever, has back pain and pressure when she pees. Pt ambulated to bathroom and stated slightly lightheaded when she walks. Pt alert and oriented. Pt has no weakness noted in strength bilaterally. Pt states she believes the weakness is coming from infection.

## 2011-11-26 NOTE — ED Notes (Signed)
Updated in w/r 

## 2011-11-27 MED ORDER — IBUPROFEN 400 MG PO TABS
600.0000 mg | ORAL_TABLET | Freq: Once | ORAL | Status: AC
  Administered 2011-11-27: 600 mg via ORAL
  Filled 2011-11-27: qty 1

## 2011-11-27 NOTE — ED Notes (Signed)
Resident at bedside to talk to pt about plan of care.

## 2011-11-27 NOTE — Discharge Instructions (Signed)
Take ibuprofen.  See Dr. Hurley Cisco.  See your doctor immediately--or return to the ED--with any new or troubling symptoms including fevers, weakness, new chest pain, shortness or breath, numbness, or any other concerning symptom.   Fever     Fever is a higher-than-normal body temperature. A normal temperature varies with:  Age.   How it is measured (mouth, underarm, rectal, or ear).   Time of day.  In an adult, an oral temperature around 98.6 Fahrenheit (F) or 37 Celsius (C) is considered normal. A rise in temperature of about 1.8 F or 1 C is generally considered a fever (100.4 F or 38 C). In an infant age 14 days or less, a rectal temperature of 100.4 F (38 C) generally is regarded as fever. Fever is not a disease but can be a symptom of illness. CAUSES   Fever is most commonly caused by infection.   Some non-infectious problems can cause fever. For example:   Some arthritis problems.   Problems with the thyroid or adrenal glands.   Immune system problems.   Some kinds of cancer.   A reaction to certain medicines.   Occasionally, the source of a fever cannot be determined. This is sometimes called a "Fever of Unknown Origin" (FUO).   Some situations may lead to a temporary rise in body temperature that may go away on its own. Examples are:   Childbirth.   Surgery.   Some situations may cause a rise in body temperature but these are not considered "true fever". Examples are:   Intense exercise.   Dehydration.   Exposure to high outside or room temperatures.  SYMPTOMS   Feeling warm or hot.   Fatigue or feeling exhausted.   Aching all over.   Chills.   Shivering.   Sweats.  DIAGNOSIS  A fever can be suspected by your caregiver feeling that your skin is unusually warm. The fever is confirmed by taking a temperature with a thermometer. Temperatures can be taken different ways. Some methods are accurate and some are not: With adults, adolescents, and  children:   An oral temperature is used most commonly.   An ear thermometer will only be accurate if it is positioned as recommended by the manufacturer.   Under the arm temperatures are not accurate and not recommended.   Most electronic thermometers are fast and accurate.  Infants and Toddlers:  Rectal temperatures are recommended and most accurate.   Ear temperatures are not accurate in this age group and are not recommended.   Skin thermometers are not accurate.  RISKS AND COMPLICATIONS   During a fever, the body uses more oxygen, so a person with a fever may develop rapid breathing or shortness of breath. This can be dangerous especially in people with heart or lung disease.   The sweats that occur following a fever can cause dehydration.   High fever can cause seizures in infants and children.   Older persons can develop confusion during a fever.  TREATMENT   Medications may be used to control temperature.   Do not give aspirin to children with fevers. There is an association with Reye's syndrome. Reye's syndrome is a rare but potentially deadly disease.   If an infection is present and medications have been prescribed, take them as directed. Finish the full course of medications until they are gone.   Sponging or bathing with room-temperature water may help reduce body temperature. Do not use ice water or alcohol sponge baths.   Do  not over-bundle children in blankets or heavy clothes.   Drinking adequate fluids during an illness with fever is important to prevent dehydration.  HOME CARE INSTRUCTIONS   For adults, rest and adequate fluid intake are important. Dress according to how you feel, but do not over-bundle.   Drink enough water and/or fluids to keep your urine clear or pale yellow.   For infants over 3 months and children, giving medication as directed by your caregiver to control fever can help with comfort. The amount to be given is based on the child's  weight. Do NOT give more than is recommended.  SEEK MEDICAL CARE IF:   You or your child are unable to keep fluids down.   Vomiting or diarrhea develops.   You develop a skin rash.   An oral temperature above 102 F (38.9 C) develops, or a fever which persists for over 3 days.   You develop excessive weakness, dizziness, fainting or extreme thirst.   Fevers keep coming back after 3 days.  SEEK IMMEDIATE MEDICAL CARE IF:   Shortness of breath or trouble breathing develops   You pass out.   You feel you are making little or no urine.   New pain develops that was not there before (such as in the head, neck, chest, back, or abdomen).   You cannot hold down fluids.   Vomiting and diarrhea persist for more than a day or two.   You develop a stiff neck and/or your eyes become sensitive to light.   An unexplained temperature above 102 F (38.9 C) develops.  Document Released: 05/17/2005 Document Revised: 05/06/2011 Document Reviewed: 05/02/2008 [facility] Patient Information 2012 [place], [place].

## 2011-11-28 NOTE — ED Provider Notes (Signed)
I saw and evaluated the patient, reviewed the resident's note and I agree with the findings and plan.  Pt seen and examined. Pt awake, alert, nad.  Lungs CTA, abdomen nontender.  Pt discharged with strict return precautions.  She is agreeable with this plan.   I have reviewed the EKG and agree with the findings as documented by the resident  Ethelda Chick, MD 11/28/11 1919

## 2011-11-29 ENCOUNTER — Encounter: Payer: Self-pay | Admitting: Family Medicine

## 2011-11-29 ENCOUNTER — Ambulatory Visit (INDEPENDENT_AMBULATORY_CARE_PROVIDER_SITE_OTHER): Payer: BC Managed Care – PPO | Admitting: Family Medicine

## 2011-11-29 VITALS — BP 120/72 | HR 87 | Temp 98.7°F | Ht 61.0 in | Wt 175.5 lb

## 2011-11-29 DIAGNOSIS — R509 Fever, unspecified: Secondary | ICD-10-CM

## 2011-11-29 DIAGNOSIS — M2550 Pain in unspecified joint: Secondary | ICD-10-CM

## 2011-11-29 MED ORDER — DOXYCYCLINE HYCLATE 100 MG PO TABS: 100.0000 mg | ORAL_TABLET | Freq: Two times a day (BID) | ORAL | Status: AC

## 2011-11-29 NOTE — Progress Notes (Signed)
Nature conservation officer at [facility] [address] [place] [address] Phone: [phone] Fax: [phone]   Patient Name: Tiffany Orozco Date of Birth: August 01, 1950 Medical Record Number: [id number] Gender: female Date of Encounter: 11/29/2011  Chief Complaint: Follow-up   History of Present Illness:  Tiffany Orozco is a 61 y.o. very pleasant female patient who presents with the following:  Pleasant patient who presents with fever to 102F MAXIMUM TEMPERATURE. She has been intermittently febrile for several days. She is having a headache and is aching all over. She is not having a significant pulmonary complaints. No sore throat. No earache. No sinus pressure or pain. Some mild nausea. She has had some occasional intermittent loose stools.  Primary complaint is fever and polyarthralgia.  History is significant for a history of complex urinary tract infection.  She also was pulled to ticks off of her body within the last 2-3 weeks.  Patient Active Problem List  Diagnosis  . HYPOTHYROIDISM  . HYPERCHOLESTEROLEMIA  . ANXIETY  . DEPRESSION  . UNSPECIFIED CARDIAC DYSRHYTHMIA  . ALLERGIC RHINITIS  . CHRONIC OBSTRUCTIVE PULMONARY DISEASE, ACUTE EXACERBATION  . ? COPD  . GERD  . CONSTIPATION, CHRONIC  . BACK PAIN, LUMBAR  . OSTEOPENIA  . MEMORY LOSS  . MURMUR  . POSTMENOPAUSAL STATUS  . TRIGGER FINGER, RIGHT THUMB  . CVA (cerebrovascular accident)  . Elevated blood pressure reading without diagnosis of hypertension  . Dyspnea  . Thrombocytopenia  . Rectocele without mention of uterine prolapse  . Fatigue  . Dental abscess   Past Medical History  Diagnosis Date  . Anxiety   . Depression   . GERD (gastroesophageal reflux disease)   . Unspecified hypothyroidism   . Skin cancer of face   . Skin cancer of arm   . Allergy   . COPD (chronic obstructive pulmonary disease)   . Hyperlipidemia   . Hypertension   . Obesity   . DDD (degenerative disc disease),  lumbar     mild  . Stroke 07/2010   Past Surgical History  Procedure Date  . Tubal ligation 1975  . Appendectomy   . Foot surgery 03/31/2009    R foot  . Emgs 7/04    atms and wrists neg  . Cholecystectomy 2004  . Foot surgery 2008, 2009, 2010    right foot    History  Substance Use Topics  . Smoking status: Current Everyday Smoker -- 0.5 packs/day for 25 years    Types: Cigarettes  . Smokeless tobacco: Never Used  . Alcohol Use: No   Family History  Problem Relation Age of Onset  . Skin cancer Father   . Hypertension Mother    Allergies  Allergen Reactions  . Lisinopril     Shortness of breath.  . Niacin     REACTION: dizziness  . Augmentin (Amoxicillin-Pot Clavulanate)     Nauseated. GI upset with taking.  . Codeine     keeps patient awake  . Oxycodone Hcl Er     Patient states this medication makes her hyper    Medication list has been reviewed and updated.  Current Outpatient Prescriptions on File Prior to Visit  Medication Sig Dispense Refill  . AGGRENOX 25-200 MG per 12 hr capsule TAKE 1 CAPSULE BY MOUTH 2 TIMES DAILY.  180 capsule  7  . amLODipine (NORVASC) 10 MG tablet Take 1 tablet (10 mg total) by mouth daily.  30 tablet  11  . cetirizine (ZYRTEC) 10 MG  tablet Take 1 tablet (10 mg total) by mouth daily.  90 tablet  3  . Cholecalciferol (VITAMIN D) 1000 UNITS capsule Take 1,000 Units by mouth daily. Take 2 (1000 unit) caps by mouth daily       . levothyroxine (SYNTHROID, LEVOTHROID) 125 MCG tablet TAKE 1 TABLET BY MOUTH ONCE A DAY  90 tablet  3  . pantoprazole (PROTONIX) 40 MG tablet TAKE 1 TABLET BY MOUTH ONCE A DAY  90 tablet  0  . simvastatin (ZOCOR) 20 MG tablet TAKE 1 TABLET EVERY DAY  90 tablet  1  . STUDY MEDICATION as directed. Stroke medicine Actos  3 tabs po qd        Review of Systems:  ROS: GEN: Acute illness details above GI: Tolerating PO intake GU: maintaining adequate hydration and urination Pulm: No SOB Interactive and getting  along well at home.  Otherwise, ROS is as per the HPI.   Physical Examination: Filed Vitals:   11/29/11 1445  BP: 120/72  Pulse: 87  Temp: 98.7 F (37.1 C)   Filed Vitals:   11/29/11 1445  Height: 5\' 1"  (1.549 m)  Weight: 175 lb 8 oz (79.606 kg)   Body mass index is 33.16 kg/(m^2). Ideal Body Weight: Weight in (lb) to have BMI = 25: 132    GEN: WDWN, NAD, Non-toxic, A & O x 3 HEENT: Atraumatic, Normocephalic. Neck supple. No masses, No LAD. Ears and Nose: No external deformity. CV: RRR, No M/G/R. No JVD. No thrill. No extra heart sounds. PULM: CTA B, no wheezes, crackles, rhonchi. No retractions. No resp. distress. No accessory muscle use. EXTR: No c/c/e NEURO Normal gait.  PSYCH: Normally interactive. Conversant. Not depressed or anxious appearing.  Calm demeanor.    EKG / Labs / Xrays: None available at time of encounter  Assessment and Plan:  1. Fever  Urine culture, doxycycline (VIBRA-TABS) 100 MG tablet  2. Polyarthralgia  Urine culture, doxycycline (VIBRA-TABS) 100 MG tablet   In this case, thing he have to assume that she has tickborne illness. Will treat for 10 days doxycycline.  Urine culture given that she had some blood in her urine or UA in the hospital and she has a strong history of urinary tract infection  Orders Today: Orders Placed This Encounter  Procedures  . Urine culture    Medications Today: Meds ordered this encounter  Medications  . doxycycline (VIBRA-TABS) 100 MG tablet    Sig: Take 1 tablet (100 mg total) by mouth 2 (two) times daily.    Dispense:  20 tablet    Refill:  0     Hannah Beat, MD

## 2011-11-30 ENCOUNTER — Encounter: Payer: Self-pay | Admitting: Cardiovascular Disease

## 2011-11-30 ENCOUNTER — Ambulatory Visit (INDEPENDENT_AMBULATORY_CARE_PROVIDER_SITE_OTHER): Payer: BC Managed Care – PPO | Admitting: Cardiovascular Disease

## 2011-11-30 VITALS — BP 126/82 | HR 78 | Ht 60.0 in | Wt 176.0 lb

## 2011-11-30 DIAGNOSIS — I10 Essential (primary) hypertension: Secondary | ICD-10-CM | POA: Insufficient documentation

## 2011-11-30 DIAGNOSIS — I6350 Cerebral infarction due to unspecified occlusion or stenosis of unspecified cerebral artery: Secondary | ICD-10-CM

## 2011-11-30 DIAGNOSIS — J441 Chronic obstructive pulmonary disease with (acute) exacerbation: Secondary | ICD-10-CM

## 2011-11-30 DIAGNOSIS — E039 Hypothyroidism, unspecified: Secondary | ICD-10-CM

## 2011-11-30 DIAGNOSIS — E7800 Pure hypercholesterolemia, unspecified: Secondary | ICD-10-CM

## 2011-11-30 DIAGNOSIS — I639 Cerebral infarction, unspecified: Secondary | ICD-10-CM

## 2011-11-30 NOTE — Assessment & Plan Note (Signed)
Improved on amlodipine.  

## 2011-11-30 NOTE — Patient Instructions (Signed)
Your physician wants you to follow-up in: YEAR WITH DR NISHAN  You will receive a reminder letter in the mail two months in advance. If you don't receive a letter, please call our office to schedule the follow-up appointment.  Your physician recommends that you continue on your current medications as directed. Please refer to the Current Medication list given to you today. 

## 2011-11-30 NOTE — Assessment & Plan Note (Signed)
Cholesterol is at goal.  Continue current dose of statin and diet Rx.  No myalgias or side effects.  F/U  LFT's in 6 months. Lab Results  Component Value Date   LDLCALC 87 10/08/2011

## 2011-11-30 NOTE — Progress Notes (Signed)
Patient ID: Tiffany Orozco, female   DOB: 03/12/1951, 61 y.o.   MRN: [id number] 61 yo referred for CVA, HTN, dyspnea. She has not felt well for the last few months. I take care of her husband Tiffany Orozco. She had a CVA 08/24/10  thought due to small vessel disease . Got TPA other than smoking etiology unclear. Near complete resolution. On Agrenox and a research study drug. Labile BP. Clearly has increased cough and signs of angioedema since started on lisinopril. Dyspnea ongoing. Nasal congestion. Has been Rx for sinus infection and UTI in last 3 months. Reporst recent PFT;s with "no" COPD. Counseled for less than 10 minutes on smoking cessation. Not motivated. She indicates less "taste" for them since stroke. No SSCP, cough but no sputum. Reviewed her echo from today. Normal LV and EF. No pulmonary hypertension. Mild AS mean gradient 9mm Hg. CXR 10/19 with no CHF or interstitial lung disease. Compliant with meds  Recent BNP normal   Feels much better off lisinopril. Started on amlodipine last visit  BP improved Recent fevers with tick bite Started on antibiotics  ROS: Denies fever, malais, weight loss, blurry vision, decreased visual acuity, cough, sputum, SOB, hemoptysis, pleuritic pain, palpitaitons, heartburn, abdominal pain, melena, lower extremity edema, claudication, or rash.  All other systems reviewed and negative  General: Affect appropriate Healthy:  appears stated age HEENT: normal Neck supple with no adenopathy JVP normal no bruits no thyromegaly Lungs clear with no wheezing and good diaphragmatic motion Heart:  S1/S2 no murmur, no rub, gallop or click PMI normal Abdomen: benighn, BS positve, no tenderness, no AAA no bruit.  No HSM or HJR Distal pulses intact with no bruits No edema Neuro non-focal Skin warm and dry No muscular weakness   Current Outpatient Prescriptions  Medication Sig Dispense Refill  . AGGRENOX 25-200 MG per 12 hr capsule TAKE 1 CAPSULE BY MOUTH 2 TIMES DAILY.   180 capsule  7  . amLODipine (NORVASC) 10 MG tablet Take 1 tablet (10 mg total) by mouth daily.  30 tablet  11  . cetirizine (ZYRTEC) 10 MG tablet Take 1 tablet (10 mg total) by mouth daily.  90 tablet  3  . Cholecalciferol (VITAMIN D) 1000 UNITS capsule Take 1,000 Units by mouth daily. Take 2 (1000 unit) caps by mouth daily       . doxycycline (VIBRA-TABS) 100 MG tablet Take 1 tablet (100 mg total) by mouth 2 (two) times daily.  20 tablet  0  . levothyroxine (SYNTHROID, LEVOTHROID) 125 MCG tablet TAKE 1 TABLET BY MOUTH ONCE A DAY  90 tablet  3  . pantoprazole (PROTONIX) 40 MG tablet TAKE 1 TABLET BY MOUTH ONCE A DAY  90 tablet  0  . simvastatin (ZOCOR) 20 MG tablet TAKE 1 TABLET EVERY DAY  90 tablet  1  . STUDY MEDICATION as directed. Stroke medicine Actos  3 tabs po qd        Allergies  Lisinopril; Niacin; Augmentin; Codeine; and Oxycodone hcl er  Electrocardiogram:  Assessment and Plan

## 2011-11-30 NOTE — Assessment & Plan Note (Signed)
Continue replacement TSH q 6 months with primary

## 2011-11-30 NOTE — Assessment & Plan Note (Signed)
Nonembolic noncardiac.  Small vessel  Continue Aggrenox

## 2011-11-30 NOTE — Assessment & Plan Note (Signed)
Counseled for less than 10 minutes on smoking cessation  Little motivation to quit

## 2011-12-01 LAB — URINE CULTURE
Colony Count: NO GROWTH
Organism ID, Bacteria: NO GROWTH

## 2011-12-03 ENCOUNTER — Other Ambulatory Visit: Payer: Self-pay | Admitting: Family Medicine

## 2011-12-06 ENCOUNTER — Encounter: Payer: Self-pay | Admitting: *Deleted

## 2012-01-17 ENCOUNTER — Ambulatory Visit: Admit: 2012-01-17 | Payer: Self-pay | Admitting: Obstetrics & Gynecology

## 2012-01-17 SURGERY — HYSTERECTOMY, VAGINAL, LAPAROSCOPY-ASSISTED
Anesthesia: Choice | Site: Vagina

## 2012-03-02 ENCOUNTER — Other Ambulatory Visit: Payer: Self-pay | Admitting: Family Medicine

## 2012-03-03 ENCOUNTER — Ambulatory Visit (INDEPENDENT_AMBULATORY_CARE_PROVIDER_SITE_OTHER): Payer: BC Managed Care – PPO

## 2012-03-03 DIAGNOSIS — Z23 Encounter for immunization: Secondary | ICD-10-CM

## 2012-04-11 ENCOUNTER — Ambulatory Visit (INDEPENDENT_AMBULATORY_CARE_PROVIDER_SITE_OTHER): Payer: BC Managed Care – PPO | Admitting: Family Medicine

## 2012-04-11 ENCOUNTER — Encounter: Payer: Self-pay | Admitting: Family Medicine

## 2012-04-11 VITALS — BP 120/72 | HR 85 | Temp 98.7°F | Ht 60.0 in | Wt 166.2 lb

## 2012-04-11 DIAGNOSIS — J209 Acute bronchitis, unspecified: Secondary | ICD-10-CM

## 2012-04-11 DIAGNOSIS — E7800 Pure hypercholesterolemia, unspecified: Secondary | ICD-10-CM

## 2012-04-11 DIAGNOSIS — I10 Essential (primary) hypertension: Secondary | ICD-10-CM

## 2012-04-11 DIAGNOSIS — E039 Hypothyroidism, unspecified: Secondary | ICD-10-CM

## 2012-04-11 MED ORDER — SIMVASTATIN 20 MG PO TABS: 20.0000 mg | ORAL_TABLET | Freq: Every day | ORAL | Status: DC

## 2012-04-11 MED ORDER — PANTOPRAZOLE SODIUM 40 MG PO TBEC: 40.0000 mg | DELAYED_RELEASE_TABLET | Freq: Every day | ORAL | Status: DC

## 2012-04-11 MED ORDER — AMLODIPINE BESYLATE 10 MG PO TABS: 10.0000 mg | ORAL_TABLET | Freq: Every day | ORAL | Status: DC

## 2012-04-11 MED ORDER — AZITHROMYCIN 250 MG PO TABS: ORAL_TABLET | ORAL | Status: DC

## 2012-04-11 MED ORDER — HYDROCOD POLST-CHLORPHEN POLST 10-8 MG/5ML PO LQCR: 5.0000 mL | Freq: Every evening | ORAL | Status: DC | PRN

## 2012-04-11 MED ORDER — LEVOTHYROXINE SODIUM 125 MCG PO TABS: 125.0000 ug | ORAL_TABLET | Freq: Every day | ORAL | Status: DC

## 2012-04-11 NOTE — Assessment & Plan Note (Signed)
Well controlled. Continue current medication.  

## 2012-04-11 NOTE — Assessment & Plan Note (Signed)
Given > 1 week of symptoms worsening, treat with antibiotics.  Continue allergy medications as this was likely initial trigger.

## 2012-04-11 NOTE — Assessment & Plan Note (Signed)
Due for re-eval. Encouraged exercise, weight loss, healthy eating habits.  

## 2012-04-11 NOTE — Patient Instructions (Addendum)
Mucinex Dm durin the day, prescription cough suppressant at noght.  Start and complete antibiotics.  Call if not turning the corner in next 72 hours or fever on antibiotics. To ER if severe shortness of breath. Return for fasting labs in next few days. Follow up for CPX in 6 months with labs prior.

## 2012-04-11 NOTE — Progress Notes (Signed)
Subjective:    Patient ID: Tiffany Orozco, female    DOB: Dec 31, 1950, 61 y.o.   MRN: [id number]  HPI  61 year old female with history of CVA presents for 6 month follow up.  After going through corn stalks 1 week ago. That night sore throat. Has had clear nasal discharge. Using allergy med an flonase, mucinex. Coughing through the night,clear productive initiially now greener.  Subjective fever. Getting worse in last few days. Left ear pain, no face pain. Headache.  Hypertension:   Well controlled on amlodipine. Using medication without problems or lightheadedness: None Chest pain with exertion:None Edema:None Short of breath:None Average home BPs:120/70 10 LB WEIGHT LOSS IN LAST 6 MONTHS. Wt Readings from Last 3 Encounters:  04/11/12 166 lb 4 oz (75.411 kg)  11/30/11 176 lb (79.833 kg)  11/29/11 175 lb 8 oz (79.606 kg)  Other issues: Has had some upset stomach lately.. Daughter recently sick, had to take care of grandkids.  Stomach sensitive to sugar and fatty foods.  Elevated Cholesterol:  Due for re-eval. On 20 mg zocor daily Lab Results  Component Value Date   CHOL 144 10/08/2011   HDL 31.50* 10/08/2011   LDLCALC 87 10/08/2011   LDLDIRECT 187.9 06/22/2010   TRIG 130.0 10/08/2011   CHOLHDL 5 10/08/2011  Using medications without problems:None Muscle aches: None Diet compliance: Has eliminated beef and pork. Grilling. Exercise: Other complaints: CVA nonembolic noncardiac: on aggrenox and study drug  Hypothyroid: Back on brand name medication. Doing much nbetter on this. Lab Results  Component Value Date   TSH 0.93 10/08/2011    Followed by Dr. Eden Emms.     Review of Systems  Constitutional: Negative for fever and fatigue.  HENT: Negative for ear pain.   Eyes: Negative for pain.  Respiratory: Negative for chest tightness and shortness of breath.   Cardiovascular: Negative for chest pain, palpitations and leg swelling.  Gastrointestinal: Negative for abdominal  pain.       GERD.  Genitourinary: Negative for dysuria.       Objective:   Physical Exam  Constitutional: Vital signs are normal. She appears well-developed and well-nourished. She is cooperative.  Non-toxic appearance. She does not appear ill. No distress.  HENT:  Head: Normocephalic.  Right Ear: Hearing, external ear and ear canal normal. Tympanic membrane is not erythematous, not retracted and not bulging. A middle ear effusion is present.  Left Ear: Hearing, external ear and ear canal normal. Tympanic membrane is not erythematous, not retracted and not bulging. A middle ear effusion is present.  Nose: Mucosal edema and rhinorrhea present. Right sinus exhibits no maxillary sinus tenderness and no frontal sinus tenderness. Left sinus exhibits no maxillary sinus tenderness and no frontal sinus tenderness.  Mouth/Throat: Uvula is midline and mucous membranes are normal. Posterior oropharyngeal edema present. No oropharyngeal exudate.  Eyes: Conjunctivae normal, EOM and lids are normal. Pupils are equal, round, and reactive to light. No foreign bodies found.  Neck: Trachea normal and normal range of motion. Neck supple. Carotid bruit is not present. No mass and no thyromegaly present.  Cardiovascular: Normal rate, regular rhythm, S1 normal, S2 normal, normal heart sounds, intact distal pulses and normal pulses.  Exam reveals no gallop and no friction rub.   No murmur heard. Pulmonary/Chest: Effort normal and breath sounds normal. Not tachypneic. No respiratory distress. She has no decreased breath sounds. She has no wheezes. She has no rhonchi. She has no rales.  Abdominal: Soft. Normal appearance and bowel sounds are  normal. There is no tenderness.  Neurological: She is alert.  Skin: Skin is warm, dry and intact. No rash noted.  Psychiatric: Her speech is normal and behavior is normal. Judgment and thought content normal. Her mood appears not anxious. Cognition and memory are normal. She does  not exhibit a depressed mood.          Assessment & Plan:

## 2012-04-12 ENCOUNTER — Other Ambulatory Visit (INDEPENDENT_AMBULATORY_CARE_PROVIDER_SITE_OTHER): Payer: BC Managed Care – PPO

## 2012-04-12 DIAGNOSIS — E7800 Pure hypercholesterolemia, unspecified: Secondary | ICD-10-CM

## 2012-04-12 LAB — COMPREHENSIVE METABOLIC PANEL
ALT: 19 U/L (ref 0–35)
AST: 19 U/L (ref 0–37)
Albumin: 3.7 g/dL (ref 3.5–5.2)
Alkaline Phosphatase: 86 U/L (ref 39–117)
BUN: 8 mg/dL (ref 6–23)
CO2: 25 mEq/L (ref 19–32)
Calcium: 8.9 mg/dL (ref 8.4–10.5)
Chloride: 100 mEq/L (ref 96–112)
Creatinine, Ser: 0.7 mg/dL (ref 0.4–1.2)
GFR: 95.98 mL/min (ref >60.00)
Glucose, Bld: 93 mg/dL (ref 70–99)
Potassium: 3.8 mEq/L (ref 3.5–5.1)
Sodium: 134 mEq/L — ABNORMAL LOW (ref 135–145)
Total Bilirubin: 0.6 mg/dL (ref 0.3–1.2)
Total Protein: 7.3 g/dL (ref 6.0–8.3)

## 2012-04-12 LAB — LIPID PANEL
Cholesterol: 121 mg/dL (ref 0–200)
HDL: 31 mg/dL — ABNORMAL LOW (ref >39.00)
LDL Cholesterol: 73 mg/dL (ref 0–99)
Total CHOL/HDL Ratio: 4
Triglycerides: 83 mg/dL (ref 0.0–149.0)
VLDL: 16.6 mg/dL (ref 0.0–40.0)

## 2012-04-13 ENCOUNTER — Encounter: Payer: Self-pay | Admitting: *Deleted

## 2012-04-14 ENCOUNTER — Other Ambulatory Visit: Payer: Self-pay | Admitting: Family Medicine

## 2012-05-02 ENCOUNTER — Other Ambulatory Visit: Payer: Self-pay | Admitting: Family Medicine

## 2012-06-04 ENCOUNTER — Other Ambulatory Visit: Payer: Self-pay | Admitting: Internal Medicine

## 2012-06-19 ENCOUNTER — Ambulatory Visit (INDEPENDENT_AMBULATORY_CARE_PROVIDER_SITE_OTHER): Payer: BC Managed Care – PPO | Admitting: Family Medicine

## 2012-06-19 ENCOUNTER — Encounter: Payer: Self-pay | Admitting: Family Medicine

## 2012-06-19 VITALS — BP 120/70 | HR 102 | Temp 100.0°F | Ht 60.0 in | Wt 164.2 lb

## 2012-06-19 DIAGNOSIS — R509 Fever, unspecified: Secondary | ICD-10-CM

## 2012-06-19 DIAGNOSIS — J209 Acute bronchitis, unspecified: Secondary | ICD-10-CM

## 2012-06-19 LAB — POCT INFLUENZA A/B
Influenza A, POC: NEGATIVE
Influenza B, POC: NEGATIVE

## 2012-06-19 MED ORDER — AZITHROMYCIN 250 MG PO TABS: ORAL_TABLET | ORAL | Status: DC

## 2012-06-19 NOTE — Progress Notes (Signed)
Nature conservation officer at [facility] [address] [place] [address] Phone: [phone] Fax: [phone]  Date:  06/19/2012   Name:  Tiffany Orozco   DOB:  May 12, 1951   MRN:  [id number] Gender: female Age: 62 y.o.  PCP:  Kerby Nora, MD  Evaluating MD: Hannah Beat, MD   Chief Complaint: Cough, Nasal Congestion, Fever and Chills   History of Present Illness:  Tiffany Orozco is a 62 y.o. pleasant patient who presents with the following:  102 T max at home Much production of cough and phlegm Feels terrible Achy all over Everything is "heavy" No sinus pain No ear ache or sore throat  Had flu shot  Patient Active Problem List  Diagnosis  . HYPOTHYROIDISM  . HYPERCHOLESTEROLEMIA  . ANXIETY  . DEPRESSION  . UNSPECIFIED CARDIAC DYSRHYTHMIA  . ALLERGIC RHINITIS  . CHRONIC OBSTRUCTIVE PULMONARY DISEASE, ACUTE EXACERBATION  . GERD  . CONSTIPATION, CHRONIC  . BACK PAIN, LUMBAR  . OSTEOPENIA  . MURMUR  . CVA (cerebrovascular accident)  . Thrombocytopenia  . Rectocele without mention of uterine prolapse  . HTN (hypertension)  . Acute bronchitis    Past Medical History  Diagnosis Date  . Anxiety   . Depression   . GERD (gastroesophageal reflux disease)   . Unspecified hypothyroidism   . Skin cancer of face   . Skin cancer of arm   . Allergy   . COPD (chronic obstructive pulmonary disease)   . Hyperlipidemia   . Hypertension   . Obesity   . DDD (degenerative disc disease), lumbar     mild  . Stroke 07/2010    Past Surgical History  Procedure Date  . Tubal ligation 1975  . Appendectomy   . Foot surgery 03/31/2009    R foot  . Emgs 7/04    atms and wrists neg  . Cholecystectomy 2004  . Foot surgery 2008, 2009, 2010    right foot     History  Substance Use Topics  . Smoking status: Current Every Day Smoker -- 0.5 packs/day for 25 years    Types: Cigarettes  . Smokeless tobacco: Never Used  . Alcohol Use: No    Family History    Problem Relation Age of Onset  . Skin cancer Father   . Hypertension Mother     Allergies  Allergen Reactions  . Lisinopril     Shortness of breath.  . Niacin     REACTION: dizziness  . Augmentin (Amoxicillin-Pot Clavulanate)     Nauseated. GI upset with taking.  . Codeine     keeps patient awake  . Oxycodone Hcl Er     Patient states this medication makes her hyper    Medication list has been reviewed and updated.  Outpatient Prescriptions Prior to Visit  Medication Sig Dispense Refill  . AGGRENOX 25-200 MG per 12 hr capsule TAKE 1 CAPSULE BY MOUTH 2 TIMES DAILY.  180 capsule  7  . amLODipine (NORVASC) 10 MG tablet Take 1 tablet (10 mg total) by mouth daily.  30 tablet  11  . cetirizine (ZYRTEC) 10 MG tablet Take 1 tablet (10 mg total) by mouth daily.  90 tablet  3  . Cholecalciferol (VITAMIN D) 1000 UNITS capsule Take 1,000 Units by mouth daily. Take 2 (1000 unit) caps by mouth daily       . levothyroxine (SYNTHROID, LEVOTHROID) 125 MCG tablet Take 1 tablet (125 mcg total) by mouth daily.  90 tablet  3  .  pantoprazole (PROTONIX) 40 MG tablet Take 1 tablet (40 mg total) by mouth daily.  90 tablet  1  . simvastatin (ZOCOR) 20 MG tablet Take 1 tablet (20 mg total) by mouth daily.  90 tablet  1  . STUDY MEDICATION as directed. Stroke medicine Actos  3 tabs po qd      . [DISCONTINUED] pantoprazole (PROTONIX) 40 MG tablet TAKE 1 TABLET BY MOUTH ONCE A DAY  90 tablet  0  . [DISCONTINUED] simvastatin (ZOCOR) 20 MG tablet TAKE 1 TABLET EVERY DAY  90 tablet  1  . [DISCONTINUED] simvastatin (ZOCOR) 20 MG tablet TAKE 1 TABLET EVERY DAY  90 tablet  1  . chlorpheniramine-HYDROcodone (TUSSIONEX) 10-8 MG/5ML LQCR Take 5 mLs by mouth at bedtime as needed.  140 mL  0  . [DISCONTINUED] azithromycin (ZITHROMAX) 250 MG tablet 2 tab po x 1 day then 1 tab daily  6 tablet  0   Last reviewed on 06/19/2012  9:46 AM by Hannah Beat, MD  Review of Systems:  ROS: GEN: Acute illness details  above GI: Tolerating PO intake GU: maintaining adequate hydration and urination Pulm: No SOB Interactive and getting along well at home.  Otherwise, ROS is as per the HPI.   Physical Examination: BP 120/70  Pulse 102  Temp 100 F (37.8 C) (Oral)  Ht 5' (1.524 m)  Wt 164 lb 4 oz (74.503 kg)  BMI 32.08 kg/m2  SpO2 92%  Ideal Body Weight: Weight in (lb) to have BMI = 25: 127.7    Gen: WDWN, NAD; A & O x3, cooperative. Pleasant.Globally Non-toxic HEENT: Normocephalic and atraumatic. Throat clear, w/o exudate, R TM clear, L TM - good landmarks, No fluid present. rhinnorhea. No frontal or maxillary sinus T. MMM NECK: Anterior cervical  LAD is present CV: RRR, No M/G/R, cap refill <2 sec PULM: Breathing comfortably in no respiratory distress. no wheezing, crackles, rhonchi ABD: S,NT,ND,+BS. No HSM. No rebound. EXT: No c/c/e PSYCH: Friendly, good eye contact MSK: Nml gait   Assessment and Plan:  1. Bronchitis, acute    2. Fever  Influenza A/B   Acute bronchitis: discussed plan of care. Given length of symptoms and overall history, will treat with ABX in this case. Continue with additional supportive care, cough medications, liquids, sleep, steam / vaporizer.  Orders Today:  Orders Placed This Encounter  Procedures  . Influenza A/B   Results for orders placed in visit on 06/19/12  POCT INFLUENZA A/B      Component Value Range   Influenza A, POC Negative     Influenza B, POC Negative       Updated Medication List: (Includes new medications, updates to list, dose adjustments) Meds ordered this encounter  Medications  . azithromycin (ZITHROMAX) 250 MG tablet    Sig: 2 tabs po on day 1, then 1 tab po for 4 days    Dispense:  6 tablet    Refill:  0    Medications Discontinued: Medications Discontinued During This Encounter  Medication Reason  . azithromycin (ZITHROMAX) 250 MG tablet   . simvastatin (ZOCOR) 20 MG tablet   . simvastatin (ZOCOR) 20 MG tablet   .  pantoprazole (PROTONIX) 40 MG tablet      Hannah Beat, MD

## 2012-06-22 ENCOUNTER — Encounter: Payer: Self-pay | Admitting: Family Medicine

## 2012-06-22 ENCOUNTER — Ambulatory Visit (INDEPENDENT_AMBULATORY_CARE_PROVIDER_SITE_OTHER)
Admission: RE | Admit: 2012-06-22 | Discharge: 2012-06-22 | Disposition: A | Discharge status: Home / Self Care | Payer: BC Managed Care – PPO | Source: Ambulatory Visit | Attending: Family Medicine | Admitting: Family Medicine

## 2012-06-22 ENCOUNTER — Ambulatory Visit (INDEPENDENT_AMBULATORY_CARE_PROVIDER_SITE_OTHER): Payer: BC Managed Care – PPO | Admitting: Family Medicine

## 2012-06-22 VITALS — BP 110/60 | HR 106 | Temp 98.3°F

## 2012-06-22 DIAGNOSIS — R0789 Other chest pain: Secondary | ICD-10-CM

## 2012-06-22 DIAGNOSIS — J209 Acute bronchitis, unspecified: Secondary | ICD-10-CM

## 2012-06-22 DIAGNOSIS — R062 Wheezing: Secondary | ICD-10-CM

## 2012-06-22 MED ORDER — HYDROCOD POLST-CHLORPHEN POLST 10-8 MG/5ML PO LQCR: 5.0000 mL | Freq: Every evening | ORAL | Status: DC | PRN

## 2012-06-22 MED ORDER — LEVOFLOXACIN 500 MG PO TABS: 500.0000 mg | ORAL_TABLET | Freq: Every day | ORAL | Status: DC

## 2012-06-22 MED ORDER — PREDNISONE 20 MG PO TABS: ORAL_TABLET | ORAL | Status: DC

## 2012-06-22 MED ORDER — ALBUTEROL SULFATE HFA 108 (90 BASE) MCG/ACT IN AERS: 2.0000 | INHALATION_SPRAY | Freq: Four times a day (QID) | RESPIRATORY_TRACT | Status: DC | PRN

## 2012-06-22 MED ORDER — IPRATROPIUM BROMIDE 0.02 % IN SOLN
0.5000 mg | Freq: Once | RESPIRATORY_TRACT | Status: AC
  Administered 2012-06-22: 0.5 mg via RESPIRATORY_TRACT

## 2012-06-22 MED ORDER — ALBUTEROL SULFATE (2.5 MG/3ML) 0.083% IN NEBU
2.5000 mg | INHALATION_SOLUTION | Freq: Once | RESPIRATORY_TRACT | Status: AC
  Administered 2012-06-22: 2.5 mg via RESPIRATORY_TRACT

## 2012-06-22 NOTE — Progress Notes (Signed)
  Subjective:    Patient ID: Tiffany Orozco, female    DOB: [birth year], 62 y.o.   MRN: [id number]  HPI CC: continued cough  Seen here 06/19/2012 with dx bronchitis and Tmax 102, negative flu A/B test, treated with azithromycin, mucinex and ibuprofen.  Continued cold chills, chest pain, coughing fits.  No fever today.  Painful to cough in mid chest and constant tight pain in mid chest.  + wheezing.  Thick phlegm coming up.  +SOB.  Yesterday with diarrhea.  Not eating or drinking much.  Chest pain ongoing since Friday evening, but progressively worsening.  Today in lobby had episode of choking while coughing into face mask, increased respiratory distress after this.  O2 sat maintained at 92-94% throughout.  ?COPD however spirometry done 03/2011 with mild restrictive pattern. Smoker - 1/2 ppd.  Currently not smoking 2/2 feeling illl. H/o stroke 2012.  Past Medical History  Diagnosis Date  . Anxiety   . Depression   . GERD (gastroesophageal reflux disease)   . Unspecified hypothyroidism   . Skin cancer of face   . Skin cancer of arm   . Allergy   . COPD (chronic obstructive pulmonary disease)   . Hyperlipidemia   . Hypertension   . Obesity   . DDD (degenerative disc disease), lumbar     mild  . Stroke 07/2010    Review of Systems Per HPI    Objective:   Physical Exam  Nursing note and vitals reviewed. Constitutional: She appears well-developed and well-nourished. She appears distressed.  HENT:  Head: Normocephalic and atraumatic.  Mouth/Throat: Oropharynx is clear and moist. No oropharyngeal exudate.  Eyes: Conjunctivae normal and EOM are normal. Pupils are equal, round, and reactive to light. No scleral icterus.  Cardiovascular: Normal rate, regular rhythm and intact distal pulses.   Murmur (2/6 SEM) heard. Pulmonary/Chest: No accessory muscle usage. She is in respiratory distress (initially, but improved after calms down). She has no decreased breath sounds. She has  wheezes (expiratory). She has rhonchi. She has rales (bilateral R>L).       No stridor After alb/atrovent neb, improved air movement.  Improved work of breathing.  Psychiatric:       anxious      Assessment & Plan:

## 2012-06-22 NOTE — Assessment & Plan Note (Signed)
Deteriorated on azithromycin. ?chronic bronchitis component. Initially given SOB and chest tightenss, concern for cardiac cause- however EKG today overall reassuring. CXR today - no infiltrate on my read. Improvement with alb/atrovent neb, although latest spirometry without evidence of COPD. Treat with levaquin, prednisone with albuterol for bronchospasm, and tussionex cough syrup at night time. Red flags to seek urgent care reviewed in detail.  EKG - NSR rate 90s, normal axis, intervals, no hypertrophy, no acute ST/T changes

## 2012-06-22 NOTE — Patient Instructions (Addendum)
I think you have persistent bronchitis - stop azithromycin.  Take levaquin once daily for 7 days as well as steroid course for inflammation in lungs. Use albuterol inhaler as needed for wheezing, shortness of breath. Cough medicine at night as needed. If not improving with this, or any worsening breathing, please seek immediate urgent care at ER.

## 2012-08-30 ENCOUNTER — Other Ambulatory Visit: Payer: Self-pay | Admitting: Family Medicine

## 2012-09-12 ENCOUNTER — Ambulatory Visit (INDEPENDENT_AMBULATORY_CARE_PROVIDER_SITE_OTHER): Payer: BC Managed Care – PPO | Admitting: Family Medicine

## 2012-09-12 ENCOUNTER — Encounter: Payer: Self-pay | Admitting: Family Medicine

## 2012-09-12 VITALS — BP 140/80 | HR 81 | Temp 98.4°F | Ht 60.0 in | Wt 161.0 lb

## 2012-09-12 DIAGNOSIS — R5383 Other fatigue: Secondary | ICD-10-CM | POA: Insufficient documentation

## 2012-09-12 DIAGNOSIS — E039 Hypothyroidism, unspecified: Secondary | ICD-10-CM

## 2012-09-12 DIAGNOSIS — E7800 Pure hypercholesterolemia, unspecified: Secondary | ICD-10-CM

## 2012-09-12 DIAGNOSIS — F329 Major depressive disorder, single episode, unspecified: Secondary | ICD-10-CM

## 2012-09-12 DIAGNOSIS — R5381 Other malaise: Secondary | ICD-10-CM

## 2012-09-12 DIAGNOSIS — J309 Allergic rhinitis, unspecified: Secondary | ICD-10-CM

## 2012-09-12 LAB — COMPREHENSIVE METABOLIC PANEL
ALT: 15 U/L (ref 0–35)
AST: 14 U/L (ref 0–37)
Albumin: 4.1 g/dL (ref 3.5–5.2)
Alkaline Phosphatase: 89 U/L (ref 39–117)
BUN: 11 mg/dL (ref 6–23)
CO2: 23 mEq/L (ref 19–32)
Calcium: 8.9 mg/dL (ref 8.4–10.5)
Chloride: 99 mEq/L (ref 96–112)
Creatinine, Ser: 0.7 mg/dL (ref 0.4–1.2)
GFR: 91.06 mL/min (ref >60.00)
Glucose, Bld: 78 mg/dL (ref 70–99)
Potassium: 4.2 mEq/L (ref 3.5–5.1)
Sodium: 132 mEq/L — ABNORMAL LOW (ref 135–145)
Total Bilirubin: 0.5 mg/dL (ref 0.3–1.2)
Total Protein: 7.3 g/dL (ref 6.0–8.3)

## 2012-09-12 LAB — TSH: TSH: 0.26 u[IU]/mL — ABNORMAL LOW (ref 0.35–5.50)

## 2012-09-12 LAB — LIPID PANEL
Cholesterol: 190 mg/dL (ref 0–200)
HDL: 25.6 mg/dL — ABNORMAL LOW (ref >39.00)
LDL Cholesterol: 135 mg/dL — ABNORMAL HIGH (ref 0–99)
Total CHOL/HDL Ratio: 7
Triglycerides: 145 mg/dL (ref 0.0–149.0)
VLDL: 29 mg/dL (ref 0.0–40.0)

## 2012-09-12 LAB — CBC WITH DIFFERENTIAL/PLATELET
Basophils Absolute: 0.1 10*3/uL (ref 0.0–0.1)
Basophils Relative: 0.8 % (ref 0.0–3.0)
Eosinophils Absolute: 0.3 10*3/uL (ref 0.0–0.7)
Eosinophils Relative: 3.6 % (ref 0.0–5.0)
HCT: 44.2 % (ref 36.0–46.0)
Hemoglobin: 14.6 g/dL (ref 12.0–15.0)
Lymphocytes Relative: 43 % (ref 12.0–46.0)
Lymphs Abs: 3.2 10*3/uL (ref 0.7–4.0)
MCHC: 33.1 g/dL (ref 30.0–36.0)
MCV: 88 fl (ref 78.0–100.0)
Monocytes Absolute: 0.7 10*3/uL (ref 0.1–1.0)
Monocytes Relative: 8.7 % (ref 3.0–12.0)
Neutro Abs: 3.3 10*3/uL (ref 1.4–7.7)
Neutrophils Relative %: 43.9 % (ref 43.0–77.0)
Platelets: 408 10*3/uL — ABNORMAL HIGH (ref 150.0–400.0)
RBC: 5.02 Mil/uL (ref 3.87–5.11)
RDW: 15.4 % — ABNORMAL HIGH (ref 11.5–14.6)
WBC: 7.5 10*3/uL (ref 4.5–10.5)

## 2012-09-12 LAB — VITAMIN B12: Vitamin B-12: 996 pg/mL — ABNORMAL HIGH (ref 211–911)

## 2012-09-12 MED ORDER — VENLAFAXINE HCL ER 37.5 MG PO CP24: 37.5000 mg | ORAL_CAPSULE | Freq: Every day | ORAL | Status: DC

## 2012-09-12 MED ORDER — DESLORATADINE 5 MG PO TABS: 5.0000 mg | ORAL_TABLET | Freq: Every day | ORAL | Status: DC

## 2012-09-12 NOTE — Assessment & Plan Note (Signed)
Poor control.. Possibly contributing to fatigue given causing insomnia.  Start venlafaxine 37.5 mg daily... This dose helped in past, may need to increase.

## 2012-09-12 NOTE — Assessment & Plan Note (Signed)
Due for re-eval. Off simvastatin due to SE. Goal <70.

## 2012-09-12 NOTE — Assessment & Plan Note (Signed)
Likely causing acute symptoms. Change ceterizine to generic allegra. Cannot afford nasal spray per pt.

## 2012-09-12 NOTE — Assessment & Plan Note (Signed)
Due for re-eval. 

## 2012-09-12 NOTE — Patient Instructions (Addendum)
Schedule CPX , no labs prior in next 1-2 month. NO clear bacterial infection or COPD flare.  We will call with lab results. Stop cetrizine.. Try generic desoloratinde prescription or if to costly  OTC generic allegra  180 mg daily at bedtime.

## 2012-09-12 NOTE — Progress Notes (Signed)
Subjective:    Patient ID: Tiffany Orozco, female    DOB: 03-10-1951, 62 y.o.   MRN: [id number]  Cough This is a new problem. The current episode started in the past 7 days. The problem has been gradually worsening. The cough is productive of sputum (yellowish). Associated symptoms include ear pain, myalgias, nasal congestion, postnasal drip and shortness of breath. Pertinent negatives include no chest pain, chills, rash, sore throat or wheezing. Associated symptoms comments: Fatigued.. The symptoms are aggravated by pollens (does have year loing allergy). Risk factors for lung disease include smoking/tobacco exposure (1/2 pack per day). Treatments tried: ceterizine daily, nasal saline, she cannot afford the proventil. The treatment provided no relief. Her past medical history is significant for COPD. There is no history of asthma.  spirometry was inconclusive.    She was seen [place]  [place]... Failed azithromycin then cough resolved completely on levaquin.  Husband recently hospitalized for pneumonia.   She has also due for cholesterol recheck  (she has stopped simvastatin due to muscle ahce, felt better) and feels that fatigue may be due to low potassium or other issues. She has fatigue for about 1 week only.  She feels cough may be due to allergies.   Depression, poor controlled. Husband sick, daughter moved back in. Tearful, lack of motivation.  Difficulty sleeping. NO SI, no HI. Did well on venlafaxine 37.5 mg daily in past.   Review of Systems  Constitutional: Negative for chills.  HENT: Positive for ear pain and postnasal drip. Negative for sore throat.   Respiratory: Positive for cough and shortness of breath. Negative for wheezing.   Cardiovascular: Negative for chest pain.  Musculoskeletal: Positive for myalgias.  Skin: Negative for rash.       Objective:   Physical Exam  Constitutional: Vital signs are normal. She appears well-developed and well-nourished. She is  cooperative.  Non-toxic appearance. She does not appear ill. No distress.  fatigued appearing in NAD  HENT:  Head: Normocephalic.  Right Ear: Hearing, tympanic membrane, external ear and ear canal normal. Tympanic membrane is not erythematous, not retracted and not bulging.  Left Ear: Hearing, tympanic membrane, external ear and ear canal normal. Tympanic membrane is not erythematous, not retracted and not bulging.  Nose: No mucosal edema or rhinorrhea. Right sinus exhibits no maxillary sinus tenderness and no frontal sinus tenderness. Left sinus exhibits no maxillary sinus tenderness and no frontal sinus tenderness.  Mouth/Throat: Uvula is midline, oropharynx is clear and moist and mucous membranes are normal.  Eyes: Conjunctivae, EOM and lids are normal. Pupils are equal, round, and reactive to light. No foreign bodies found.  Neck: Trachea normal and normal range of motion. Neck supple. Carotid bruit is not present. No mass and no thyromegaly present.  Cardiovascular: Normal rate, regular rhythm, S1 normal, S2 normal, normal heart sounds, intact distal pulses and normal pulses.  Exam reveals no gallop and no friction rub.   No murmur heard. Pulmonary/Chest: Effort normal and breath sounds normal. Not tachypneic. No respiratory distress. She has no decreased breath sounds. She has no wheezes. She has no rhonchi. She has no rales.  Abdominal: Soft. Normal appearance and bowel sounds are normal. There is no tenderness.  Neurological: She is alert.  Skin: Skin is warm, dry and intact. No rash noted.  Psychiatric: Her speech is normal. Judgment and thought content normal. Her mood appears not anxious. She is withdrawn. Cognition and memory are normal. She exhibits a depressed mood.  Assessment & Plan:

## 2012-09-12 NOTE — Assessment & Plan Note (Signed)
Eval with labs. 

## 2012-09-13 LAB — VITAMIN D 25 HYDROXY (VIT D DEFICIENCY, FRACTURES): Vit D, 25-Hydroxy: 54 ng/mL (ref 30–89)

## 2012-09-18 ENCOUNTER — Encounter: Payer: Self-pay | Admitting: *Deleted

## 2012-09-29 ENCOUNTER — Other Ambulatory Visit: Payer: Self-pay | Admitting: Family Medicine

## 2012-09-29 MED ORDER — SYNTHROID 125 MCG PO TABS: ORAL_TABLET | ORAL | Status: DC

## 2012-10-03 ENCOUNTER — Encounter: Payer: Self-pay | Admitting: Family Medicine

## 2012-10-03 ENCOUNTER — Ambulatory Visit (INDEPENDENT_AMBULATORY_CARE_PROVIDER_SITE_OTHER): Payer: BC Managed Care – PPO | Admitting: Family Medicine

## 2012-10-03 VITALS — BP 120/60 | HR 80 | Temp 98.0°F | Ht 60.0 in | Wt 163.8 lb

## 2012-10-03 DIAGNOSIS — E7800 Pure hypercholesterolemia, unspecified: Secondary | ICD-10-CM

## 2012-10-03 DIAGNOSIS — Z0000 Encounter for general adult medical examination without abnormal findings: Secondary | ICD-10-CM

## 2012-10-03 DIAGNOSIS — K029 Dental caries, unspecified: Secondary | ICD-10-CM | POA: Insufficient documentation

## 2012-10-03 DIAGNOSIS — E039 Hypothyroidism, unspecified: Secondary | ICD-10-CM

## 2012-10-03 DIAGNOSIS — K1370 Unspecified lesions of oral mucosa: Secondary | ICD-10-CM

## 2012-10-03 MED ORDER — VENLAFAXINE HCL ER 75 MG PO CP24: 75.0000 mg | ORAL_CAPSULE | Freq: Every day | ORAL | Status: DC

## 2012-10-03 NOTE — Progress Notes (Signed)
62 year old female with history of recent CVA presents for wellness exam.   Seen for acute bronchitis in end of 04/2013, seen for allergies in 08/2012. Gradually improving some.  PFTs were normal in 2012... No clear COPD.   Hypothyroid: Low TSH  on levothyroxine.  Lab Results  Component Value Date   TSH 0.26* 09/12/2012     Elevated Cholesterol: HDL low, but LDL NOT at goal <70 on no med. Could not tolerate statin 20 mg at higher dose... Plan to recheck in 3 months. Lab Results  Component Value Date   CHOL 190 09/12/2012   HDL 25.60* 09/12/2012   LDLCALC 135* 09/12/2012   LDLDIRECT 187.9 06/22/2010   TRIG 145.0 09/12/2012   CHOLHDL 7 09/12/2012  Using medications without problems:None  Muscle aches: None  Diet compliance: Moderate  Exercise:Minimal since sick in last few weeks.  Other complaints:    Depression, last OV poor control: restarted venlafaxine 37.5 mg daily. She reports this has helped greatly but has room for improvement.  Review of Systems  Constitutional: Negative for fever, fatigue and unexpected weight change.  HENT: Negative for ear pain, congestion, sore throat, sneezing, trouble swallowing and sinus pressure.  Eyes: Negative for pain and itching.  Respiratory: . Negative for shortness of breath and wheezing.  Cardiovascular: Negative for chest pain, palpitations and leg swelling.  Gastrointestinal: Negative for nausea, abdominal pain, diarrhea, constipation and blood in stool.  Genitourinary: Negative for dysuria, hematuria, vaginal discharge, difficulty urinating and menstrual problem.  Skin: Negative for rash.  Neurological: Positive for headaches. Negative for syncope, weakness, light-headedness and numbness.  Psychiatric/Behavioral: Negative for confusion and dysphoric mood. The patient is not nervous/anxious.  Objective:   Physical Exam  Constitutional: Vital signs are normal. She appears well-developed and well-nourished. She is cooperative. Non-toxic  appearance. She does not appear ill. No distress.  overweight  HENT:  Hyperpigmented lesion on roof of mouth, 1-2 mm in diameter. Head: Normocephalic.  Right Ear: Hearing, tympanic membrane, external ear and ear canal normal.  Left Ear: Hearing, tympanic membrane, external ear and ear canal normal.  Nose: Nose normal.  Eyes: Conjunctivae, EOM and lids are normal. Pupils are equal, round, and reactive to light. No foreign bodies found.  Neck: Trachea normal and normal range of motion. Neck supple. Carotid bruit is not present. No mass and no thyromegaly present.  Cardiovascular: Normal rate, regular rhythm, S1 normal, S2 normal, normal heart sounds and intact distal pulses. Exam reveals no gallop.  No murmur heard.  Pulmonary/Chest: Effort normal and breath sounds normal. No respiratory distress. She has no wheezes. She has no rhonchi. She has no rales.  Abdominal: Soft. Normal appearance and bowel sounds are normal. She exhibits no distension, no fluid wave, no abdominal bruit and no mass. There is no hepatosplenomegaly. There is no tenderness. There is no rebound, no guarding and no CVA tenderness. No hernia.  Genitourinary: Vagina normal and uterus normal. No breast swelling, tenderness, discharge or bleeding. Pelvic exam was performed with patient prone. There is no rash, tenderness or lesion on the right labia. There is no rash, tenderness or lesion on the left labia. Uterus is not enlarged and not tender.  Right adnexum displays no mass, no tenderness and no fullness. Left adnexum displays no mass, no tenderness and no fullness.  Rectocele, no prolapse  Lymphadenopathy:  She has no cervical adenopathy.  She has no axillary adenopathy.  Neurological: She is alert. She has normal strength. No cranial nerve deficit or sensory  deficit.  Skin: Skin is warm, dry and intact. No rash noted.  Psychiatric: She has a normal mood and affect. Her speech is normal and behavior is normal. Judgment and  thought content normal. Her mood appears not anxious. Cognition and memory are normal. She does not exhibit a depressed mood.  Assessment & Plan:   Complete Physical Exam: The patient's preventative maintenance and recommended screening tests for an annual wellness exam were reviewed in full today.  Brought up to date unless services declined.  Counselled on the importance of diet, exercise, and its role in overall health and mortality.  The patient's FH and SH was reviewed, including their home life, tobacco status, and drug and alcohol status.   Vaccines: Uptodate with flu, Td, shingles  PAP/DVE:06/2011, every 3 year, yearly DVE. Mammo: nml 05/05/2011  DXA: osteopenia 05/2011  No significant change, next due in 5 years. Colon: 04/2010, Dr. Juanda Chance, nml, repeat in 10 years  Smoker:Restarted currently pre-contemplative.

## 2012-10-03 NOTE — Assessment & Plan Note (Signed)
Referral to ENT to evaluate given it is new and she is a longtime smoker.

## 2012-10-03 NOTE — Patient Instructions (Addendum)
Follow up in 3 months with fasting labs prior. Call to schedule mammogram on your own. Stop by front desk to set up referral to ENT for oral lesion.

## 2012-10-05 ENCOUNTER — Other Ambulatory Visit: Payer: Self-pay | Admitting: Otolaryngology

## 2012-11-08 ENCOUNTER — Encounter: Payer: Self-pay | Admitting: Family Medicine

## 2012-12-12 ENCOUNTER — Other Ambulatory Visit: Payer: Self-pay | Admitting: Family Medicine

## 2012-12-29 ENCOUNTER — Other Ambulatory Visit (INDEPENDENT_AMBULATORY_CARE_PROVIDER_SITE_OTHER): Payer: BC Managed Care – PPO

## 2012-12-29 DIAGNOSIS — I10 Essential (primary) hypertension: Secondary | ICD-10-CM

## 2012-12-29 DIAGNOSIS — E7800 Pure hypercholesterolemia, unspecified: Secondary | ICD-10-CM

## 2012-12-29 DIAGNOSIS — R5381 Other malaise: Secondary | ICD-10-CM

## 2012-12-29 DIAGNOSIS — E039 Hypothyroidism, unspecified: Secondary | ICD-10-CM

## 2012-12-29 DIAGNOSIS — R5383 Other fatigue: Secondary | ICD-10-CM

## 2012-12-29 LAB — LIPID PANEL
Cholesterol: 143 mg/dL (ref 0–200)
HDL: 32 mg/dL — ABNORMAL LOW (ref >39.00)
LDL Cholesterol: 90 mg/dL (ref 0–99)
Total CHOL/HDL Ratio: 4
Triglycerides: 107 mg/dL (ref 0.0–149.0)
VLDL: 21.4 mg/dL (ref 0.0–40.0)

## 2012-12-29 LAB — COMPREHENSIVE METABOLIC PANEL
ALT: 18 U/L (ref 0–35)
AST: 18 U/L (ref 0–37)
Albumin: 3.9 g/dL (ref 3.5–5.2)
Alkaline Phosphatase: 87 U/L (ref 39–117)
BUN: 10 mg/dL (ref 6–23)
CO2: 28 mEq/L (ref 19–32)
Calcium: 9 mg/dL (ref 8.4–10.5)
Chloride: 99 mEq/L (ref 96–112)
Creatinine, Ser: 0.7 mg/dL (ref 0.4–1.2)
GFR: 85.25 mL/min (ref >60.00)
Glucose, Bld: 91 mg/dL (ref 70–99)
Potassium: 4.4 mEq/L (ref 3.5–5.1)
Sodium: 134 mEq/L — ABNORMAL LOW (ref 135–145)
Total Bilirubin: 0.6 mg/dL (ref 0.3–1.2)
Total Protein: 7.2 g/dL (ref 6.0–8.3)

## 2012-12-29 LAB — TSH: TSH: 0.25 u[IU]/mL — ABNORMAL LOW (ref 0.35–5.50)

## 2012-12-29 LAB — T3, FREE: T3, Free: 2.5 pg/mL (ref 2.3–4.2)

## 2012-12-29 LAB — T4, FREE: Free T4: 1.49 ng/dL (ref 0.60–1.60)

## 2013-01-05 ENCOUNTER — Ambulatory Visit: Payer: BC Managed Care – PPO | Admitting: Family Medicine

## 2013-01-17 ENCOUNTER — Ambulatory Visit (INDEPENDENT_AMBULATORY_CARE_PROVIDER_SITE_OTHER): Payer: Self-pay | Admitting: *Deleted

## 2013-01-17 DIAGNOSIS — I639 Cerebral infarction, unspecified: Secondary | ICD-10-CM

## 2013-01-17 DIAGNOSIS — I6350 Cerebral infarction due to unspecified occlusion or stenosis of unspecified cerebral artery: Secondary | ICD-10-CM

## 2013-01-18 NOTE — Progress Notes (Signed)
Participant in the office for her 2nd. Annual study visit for the IRIS study. MMSE was completed successfully without any problems.  Blood was  drawn and shipped.  Participant to continue with study visits per IRIS protocol.

## 2013-01-25 ENCOUNTER — Other Ambulatory Visit: Payer: Self-pay | Admitting: Family Medicine

## 2013-01-25 NOTE — Telephone Encounter (Signed)
Spoke with patient and she states she is taking Effexor XR 75 mg.  Ileana Ladd

## 2013-02-11 ENCOUNTER — Other Ambulatory Visit: Payer: Self-pay | Admitting: Family Medicine

## 2013-02-13 ENCOUNTER — Ambulatory Visit: Payer: BC Managed Care – PPO | Admitting: Family Medicine

## 2013-02-16 ENCOUNTER — Ambulatory Visit (INDEPENDENT_AMBULATORY_CARE_PROVIDER_SITE_OTHER): Payer: BC Managed Care – PPO | Admitting: Family Medicine

## 2013-02-16 ENCOUNTER — Encounter: Payer: Self-pay | Admitting: Family Medicine

## 2013-02-16 VITALS — BP 132/70 | HR 88 | Temp 98.2°F | Ht 60.0 in | Wt 168.5 lb

## 2013-02-16 DIAGNOSIS — Z23 Encounter for immunization: Secondary | ICD-10-CM

## 2013-02-16 DIAGNOSIS — E7800 Pure hypercholesterolemia, unspecified: Secondary | ICD-10-CM

## 2013-02-16 DIAGNOSIS — I10 Essential (primary) hypertension: Secondary | ICD-10-CM

## 2013-02-16 DIAGNOSIS — K219 Gastro-esophageal reflux disease without esophagitis: Secondary | ICD-10-CM

## 2013-02-16 DIAGNOSIS — E039 Hypothyroidism, unspecified: Secondary | ICD-10-CM

## 2013-02-16 DIAGNOSIS — F329 Major depressive disorder, single episode, unspecified: Secondary | ICD-10-CM

## 2013-02-16 NOTE — Assessment & Plan Note (Signed)
Free t3 and free t4 in nml range.

## 2013-02-16 NOTE — Patient Instructions (Addendum)
Stop the protonix for now and do a trial of nexium 40 mg  Daily (2 Tabs of OTC 20 mgs) If nausea and heartburn not improved let me know... May need GI referral. Avoid reflux triggers.  Make appt in 09/2012 for welcome to medicare wellness with labs prior.

## 2013-02-16 NOTE — Progress Notes (Signed)
62 year old female presents for 3 months follow up.  Occ spells of fatigue, headache, and nausea... Last a day or two and then resolves.  Occuring several times a month. Had multiple tick bites earlier in the year. She has been having a lot of heartburn and indigestion. Despite being on protonix.  Hypothyroid: Low TSH on levothyroxine but free t3 and free t4 in nml range. Lab Results  Component Value Date   TSH 0.25* 12/29/2012   Elevated Cholesterol: HDL low, but LDL NOT at goal <70 on no med. Could not tolerate statin 20 mg at higher dose...  Lab Results  Component Value Date   CHOL 143 12/29/2012   HDL 32.00* 12/29/2012   LDLCALC 90 12/29/2012   LDLDIRECT 187.9 06/22/2010   TRIG 107.0 12/29/2012   CHOLHDL 4 12/29/2012  Using medications without problems:None  Muscle aches: None  Diet compliance: Moderate  Exercise:Minimal since sick in last few weeks.  Other complaints:   Depression, improved control on  venlafaxine 75 mg daily.  Review of Systems  Constitutional: Negative for fever, fatigue and unexpected weight change.  HENT: Negative for ear pain, congestion, sore throat, sneezing, trouble swallowing and sinus pressure.  Eyes: Negative for pain and itching.  Respiratory: . Negative for shortness of breath and wheezing.  Cardiovascular: Negative for chest pain, palpitations and leg swelling.  Gastrointestinal: Negative for nausea, abdominal pain, diarrhea, constipation and blood in stool.  Genitourinary: Negative for dysuria, hematuria, vaginal discharge, difficulty urinating and menstrual problem.  Skin: Negative for rash.  Neurological: Positive for headaches. Negative for syncope, weakness, light-headedness and numbness.  Psychiatric/Behavioral: Negative for confusion and dysphoric mood. The patient is not nervous/anxious.  Objective:   Physical Exam  Constitutional: Vital signs are normal. She appears well-developed and well-nourished. She is cooperative. Non-toxic appearance.  She does not appear ill. No distress.  overweight  HENT: Hyperpigmented lesion on roof of mouth, 1-2 mm in diameter.  Head: Normocephalic.  Right Ear: Hearing, tympanic membrane, external ear and ear canal normal.  Left Ear: Hearing, tympanic membrane, external ear and ear canal normal.  Nose: Nose normal.  Eyes: Conjunctivae, EOM and lids are normal. Pupils are equal, round, and reactive to light. No foreign bodies found.  Neck: Trachea normal and normal range of motion. Neck supple. Carotid bruit is not present. No mass and no thyromegaly present.  Cardiovascular: Normal rate, regular rhythm, S1 normal, S2 normal, normal heart sounds and intact distal pulses. Exam reveals no gallop.  No murmur heard.  Pulmonary/Chest: Effort normal and breath sounds normal. No respiratory distress. She has no wheezes. She has no rhonchi. She has no rales.  Abdominal: Soft. Normal appearance and bowel sounds are normal. She exhibits no distension, no fluid wave, no abdominal bruit and no mass. There is no hepatosplenomegaly. There is no tenderness. There is no rebound, no guarding and no CVA tenderness. No hernia.  Lymphadenopathy:  She has no cervical adenopathy.  She has no axillary adenopathy.  Neurological: She is alert. She has normal strength. No cranial nerve deficit or sensory deficit.  Skin: Skin is warm, dry and intact. No rash noted.  Psychiatric: She has a normal mood and affect. Her speech is normal and behavior is normal. Judgment and thought content normal. Her mood appears not anxious. Cognition and memory are normal. She does not exhibit a depressed mood.

## 2013-02-16 NOTE — Assessment & Plan Note (Signed)
Not yet at goal  LDL, 70 but improved fro last check. Continue current dose of med ( cannot tolerate more, cannot afford welchol)

## 2013-02-16 NOTE — Assessment & Plan Note (Signed)
Well controlled. Continue current medication.  

## 2013-02-16 NOTE — Assessment & Plan Note (Signed)
Inadequate control. Daily symptoms despite protonix.  Trial of nexium. If not improving in 2 week... Consider zegrid or other PPI and referralt to GI for endoscopy.  Reviewed GERD triggers to avoid.

## 2013-03-10 ENCOUNTER — Other Ambulatory Visit: Payer: Self-pay | Admitting: Family Medicine

## 2013-05-13 ENCOUNTER — Other Ambulatory Visit: Payer: Self-pay | Admitting: Family Medicine

## 2013-07-03 ENCOUNTER — Other Ambulatory Visit: Payer: Self-pay | Admitting: *Deleted

## 2013-07-03 ENCOUNTER — Other Ambulatory Visit: Payer: Self-pay | Admitting: Family Medicine

## 2013-07-03 MED ORDER — AMLODIPINE BESYLATE 10 MG PO TABS: 10.0000 mg | ORAL_TABLET | Freq: Every day | ORAL | Status: DC

## 2013-07-03 NOTE — Telephone Encounter (Signed)
Received fax from [facility] requesting a 90 day supply.

## 2013-07-28 ENCOUNTER — Other Ambulatory Visit: Payer: Self-pay | Admitting: Family Medicine

## 2013-08-07 ENCOUNTER — Ambulatory Visit (INDEPENDENT_AMBULATORY_CARE_PROVIDER_SITE_OTHER)
Admission: RE | Admit: 2013-08-07 | Discharge: 2013-08-07 | Disposition: A | Discharge status: Home / Self Care | Payer: BC Managed Care – PPO | Source: Ambulatory Visit | Attending: Family Medicine | Admitting: Family Medicine

## 2013-08-07 ENCOUNTER — Ambulatory Visit (INDEPENDENT_AMBULATORY_CARE_PROVIDER_SITE_OTHER): Payer: BC Managed Care – PPO | Admitting: Family Medicine

## 2013-08-07 ENCOUNTER — Telehealth: Payer: Self-pay | Admitting: Family Medicine

## 2013-08-07 ENCOUNTER — Encounter: Payer: Self-pay | Admitting: Family Medicine

## 2013-08-07 VITALS — BP 140/70 | HR 88 | Temp 97.9°F | Ht 60.0 in | Wt 175.2 lb

## 2013-08-07 DIAGNOSIS — R0602 Shortness of breath: Secondary | ICD-10-CM | POA: Insufficient documentation

## 2013-08-07 DIAGNOSIS — J441 Chronic obstructive pulmonary disease with (acute) exacerbation: Secondary | ICD-10-CM

## 2013-08-07 LAB — CBC WITH DIFFERENTIAL/PLATELET
Basophils Absolute: 0 10*3/uL (ref 0.0–0.1)
Basophils Relative: 0.5 % (ref 0.0–3.0)
Eosinophils Absolute: 0.2 10*3/uL (ref 0.0–0.7)
Eosinophils Relative: 2.5 % (ref 0.0–5.0)
HCT: 43.3 % (ref 36.0–46.0)
Hemoglobin: 14.4 g/dL (ref 12.0–15.0)
Lymphocytes Relative: 33.4 % (ref 12.0–46.0)
Lymphs Abs: 2.9 10*3/uL (ref 0.7–4.0)
MCHC: 33.4 g/dL (ref 30.0–36.0)
MCV: 90.2 fl (ref 78.0–100.0)
Monocytes Absolute: 0.8 10*3/uL (ref 0.1–1.0)
Monocytes Relative: 8.9 % (ref 3.0–12.0)
Neutro Abs: 4.8 10*3/uL (ref 1.4–7.7)
Neutrophils Relative %: 54.7 % (ref 43.0–77.0)
Platelets: 394 10*3/uL (ref 150.0–400.0)
RBC: 4.79 Mil/uL (ref 3.87–5.11)
RDW: 14.9 % — ABNORMAL HIGH (ref 11.5–14.6)
WBC: 8.8 10*3/uL (ref 4.5–10.5)

## 2013-08-07 LAB — BRAIN NATRIURETIC PEPTIDE: Pro B Natriuretic peptide (BNP): 16 pg/mL (ref 0.0–100.0)

## 2013-08-07 MED ORDER — FLUTICASONE-SALMETEROL 250-50 MCG/DOSE IN AEPB: 1.0000 | INHALATION_SPRAY | Freq: Two times a day (BID) | RESPIRATORY_TRACT | Status: DC

## 2013-08-07 NOTE — Progress Notes (Signed)
Subjective:    Patient ID: Tiffany Orozco, female    DOB: April 13, 1951, 63 y.o.   MRN: [id number]  Cough This is a new problem. The current episode started 1 to 4 weeks ago (2 weeks). The problem has been gradually worsening. The problem occurs every few minutes. The cough is productive of sputum (clear mucus). Associated symptoms include ear congestion, ear pain, headaches, shortness of breath and wheezing. Pertinent negatives include no chest pain, fever, myalgias, nasal congestion, postnasal drip, rhinorrhea, sore throat or weight loss. Associated symptoms comments: Chest tightness  fatigued  both ears occ painful. Exacerbated by: shortness of breath worse with walking. Risk factors for lung disease include smoking/tobacco exposure. She has tried nothing for the symptoms. Her past medical history is significant for COPD and environmental allergies. There is no history of asthma, bronchiectasis, bronchitis, emphysema or pneumonia.  Shortness of Breath Associated symptoms include ear pain, headaches and wheezing. Pertinent negatives include no abdominal pain, chest pain, fever, leg swelling, rhinorrhea or sore throat. Her past medical history is significant for COPD. There is no history of asthma or pneumonia.    More shortness of breath than cough... Only coughing in AMs She took her husbands lasix.. Felt better.  She has not tolerated prednisone in past. Does not remember why.   Review of Systems  Constitutional: Negative for fever and weight loss.  HENT: Positive for ear pain. Negative for postnasal drip, rhinorrhea and sore throat.   Respiratory: Positive for cough, shortness of breath and wheezing.   Cardiovascular: Negative for chest pain, palpitations and leg swelling.  Gastrointestinal: Negative for abdominal pain.  Musculoskeletal: Negative for myalgias.  Allergic/Immunologic: Positive for environmental allergies.  Neurological: Positive for headaches.       Objective:   Physical Exam  Constitutional: Vital signs are normal. She appears well-developed and well-nourished. She is cooperative.  Non-toxic appearance. She does not appear ill. No distress.  HENT:  Head: Normocephalic.  Right Ear: Hearing, external ear and ear canal normal. Tympanic membrane is not erythematous, not retracted and not bulging. A middle ear effusion is present.  Left Ear: Hearing, external ear and ear canal normal. Tympanic membrane is not erythematous, not retracted and not bulging. A middle ear effusion is present.  Nose: No mucosal edema or rhinorrhea. Right sinus exhibits no maxillary sinus tenderness and no frontal sinus tenderness. Left sinus exhibits no maxillary sinus tenderness and no frontal sinus tenderness.  Mouth/Throat: Uvula is midline, oropharynx is clear and moist and mucous membranes are normal.  Eyes: Conjunctivae, EOM and lids are normal. Pupils are equal, round, and reactive to light. Lids are everted and swept, no foreign bodies found.  Neck: Trachea normal and normal range of motion. Neck supple. Carotid bruit is not present. No mass and no thyromegaly present.  Cardiovascular: Normal rate, regular rhythm, S1 normal, S2 normal, normal heart sounds, intact distal pulses and normal pulses.  Exam reveals no gallop and no friction rub.   No murmur heard. Pulmonary/Chest: Effort normal and breath sounds normal. Not tachypneic. No respiratory distress. She has no decreased breath sounds. She has no wheezes. She has no rhonchi. She has no rales.  Abdominal: Soft. Normal appearance and bowel sounds are normal. There is no tenderness.  Neurological: She is alert.  Skin: Skin is warm, dry and intact. No rash noted.  Psychiatric: Her speech is normal and behavior is normal. Judgment and thought content normal. Her mood appears not anxious. Cognition and memory are normal. She does not  exhibit a depressed mood.          Assessment & Plan:

## 2013-08-07 NOTE — Progress Notes (Signed)
Pre visit review using our clinic review tool, if applicable. No additional management support is needed unless otherwise documented below in the visit note. 

## 2013-08-07 NOTE — Telephone Encounter (Signed)
Relevant patient education mailed to patient.  

## 2013-08-07 NOTE — Assessment & Plan Note (Signed)
Not clear COPD exacerbation. Peak flow moderate today. EKG stable, NSR. Will eval cbc, TSH, BNP ( ? Improved symptoms with husband's lasix, but pt does not appear fluid overloaded today.) Pt intolerant of steroids and states she cannot afford albuterol.  ? If symptoms are just COPD worsening.

## 2013-08-07 NOTE — Telephone Encounter (Signed)
No suggestion of infection, no anemia and no sign of fluid overload.  Shortness of breath is likely due to COPD worsening. She is intolerant of prednisone so will have her start inhaled steroid such as advair. Give samples if we have any.  Follow up if not improving in 2 weeks, call sooner if fever or change in sputum.

## 2013-08-07 NOTE — Patient Instructions (Addendum)
Stop at lab on way out. We will call with X-ray results.

## 2013-08-07 NOTE — Telephone Encounter (Signed)
Patient notified as instructed by telephone. 

## 2013-09-03 ENCOUNTER — Other Ambulatory Visit: Payer: Self-pay | Admitting: *Deleted

## 2013-09-03 MED ORDER — LEVOTHYROXINE SODIUM 125 MCG PO TABS: ORAL_TABLET | ORAL | Status: DC

## 2013-09-03 NOTE — Telephone Encounter (Signed)
Faxed refill request. It looks like the patient is overdue for labs.  Please advise.

## 2013-09-10 ENCOUNTER — Other Ambulatory Visit: Payer: Self-pay | Admitting: Family Medicine

## 2013-09-11 ENCOUNTER — Encounter: Payer: Self-pay | Admitting: Family Medicine

## 2013-09-11 ENCOUNTER — Ambulatory Visit (INDEPENDENT_AMBULATORY_CARE_PROVIDER_SITE_OTHER): Payer: BC Managed Care – PPO | Admitting: Family Medicine

## 2013-09-11 VITALS — BP 126/72 | HR 80 | Temp 97.4°F | Wt 176.0 lb

## 2013-09-11 DIAGNOSIS — J111 Influenza due to unidentified influenza virus with other respiratory manifestations: Secondary | ICD-10-CM

## 2013-09-11 MED ORDER — HYDROCODONE-HOMATROPINE 5-1.5 MG/5ML PO SYRP: 5.0000 mL | ORAL_SOLUTION | Freq: Every evening | ORAL | Status: DC | PRN

## 2013-09-11 MED ORDER — OSELTAMIVIR PHOSPHATE 75 MG PO CAPS: 75.0000 mg | ORAL_CAPSULE | Freq: Two times a day (BID) | ORAL | Status: DC

## 2013-09-11 NOTE — Progress Notes (Signed)
Pre visit review using our clinic review tool, if applicable. No additional management support is needed unless otherwise documented below in the visit note. 

## 2013-09-11 NOTE — Progress Notes (Signed)
63 year old female presents for  follow up.   Nasal congestion and headache started in last 3-4 days. Sinus pressure. Has post nasal drip at night. Low grade fever 99.0 in last few days. Chills Cough at night.  Cannot sleep at night. Joint ache and body pain. Mild SOB worsening with movement. Daughter with influenza B dx last week. She has been around her.  Using benzonatate doesn't help. Mucinex DM, tylenol.  Shortness of breath: Seen in 07/2013 for  Cough. Nml EKG, cbc, TSH and BNP nml.  These symptoms resolved. Hx of COPD.  2012 PFTs showed only mild restriction. Cannot afford albuterol, steroids inhaled caused SE.   Review of Systems  Constitutional: Negative for fever, fatigue and unexpected weight change.  HENT: Negative for ear pain, congestion, sore throat, sneezing, trouble swallowing and sinus pressure.  Eyes: Negative for pain and itching.  Respiratory: . Negative for shortness of breath and wheezing.  Cardiovascular: Negative for chest pain, palpitations and leg swelling.  Gastrointestinal: Negative for nausea, abdominal pain, diarrhea, constipation and blood in stool.  Genitourinary: Negative for dysuria, hematuria, vaginal discharge, difficulty urinating and menstrual problem.  Skin: Negative for rash.  Neurological: Positive for headaches. Negative for syncope, weakness, light-headedness and numbness.  Psychiatric/Behavioral: Negative for confusion and dysphoric mood. The patient is not nervous/anxious.  Objective:   Physical Exam  Constitutional: Vital signs are normal. She appears well-developed and well-nourished. She is cooperative. Non-toxic appearance. She does not appear ill. No distress.  overweight  HENT: Hyperpigmented lesion on roof of mouth, 1-2 mm in diameter.  Head: Normocephalic.  Right Ear: Hearing, tympanic membrane, external ear and ear canal normal.  Left Ear: Hearing, tympanic membrane, external ear and ear canal normal.  Nose: Nose normal.   Eyes: Conjunctivae, EOM and lids are normal. Pupils are equal, round, and reactive to light. No foreign bodies found.  Neck: Trachea normal and normal range of motion. Neck supple. Carotid bruit is not present. No mass and no thyromegaly present.  Cardiovascular: Normal rate, regular rhythm, S1 normal, S2 normal, normal heart sounds and intact distal pulses. Exam reveals no gallop.  No murmur heard.  Pulmonary/Chest: Effort normal and breath sounds normal. No respiratory distress. She has no wheezes. She has no rhonchi. She has no rales.  Abdominal: Soft. Normal appearance and bowel sounds are normal. She exhibits no distension, no fluid wave, no abdominal bruit and no mass. There is no hepatosplenomegaly. There is no tenderness. There is no rebound, no guarding and no CVA tenderness. No hernia.  Lymphadenopathy:  She has no cervical adenopathy.  She has no axillary adenopathy.  Neurological: She is alert. She has normal strength. No cranial nerve deficit or sensory deficit.  Skin: Skin is warm, dry and intact. No rash noted.  Psychiatric: She has a normal mood and affect. Her speech is normal and behavior is normal. Judgment and thought content normal. Her mood appears not anxious. Cognition and memory are normal. She does not exhibit a depressed mood.

## 2013-09-11 NOTE — Patient Instructions (Addendum)
Mucinex DM once daily. Cough suppressant rx at night. Rest, fluids. Stay away from others until 24 hours after fever resolved.  Call sooner if any shortness of breath increase/ wheeze.

## 2013-09-11 NOTE — Assessment & Plan Note (Signed)
Testing not available but flu symptoms  and known contact with test proven influenza B.  Given pt COPD... rec tamiflu x 5 days.   Discussed symptomatic care.  Hydration, rest. Call if SOB, cough worsening or prolongued fever. Reviewed flu timeline. Discussed family prophylaxis, her husband will call MD to consider tamiflu prophylaxis. She was advised to not return to work until 24 hour after fever resolved on no antipyretics.

## 2013-09-17 ENCOUNTER — Other Ambulatory Visit: Payer: Self-pay | Admitting: Family Medicine

## 2013-09-21 ENCOUNTER — Telehealth: Payer: Self-pay

## 2013-09-21 MED ORDER — ASPIRIN-DIPYRIDAMOLE ER 25-200 MG PO CP12: ORAL_CAPSULE | ORAL | Status: DC

## 2013-09-21 NOTE — Telephone Encounter (Signed)
Prescription faxed to [phone].

## 2013-09-21 NOTE — Telephone Encounter (Addendum)
Pt said she had been approved to receive Aggrenox at a reduced savings from [employer]. Pt request Aggrenox rx faxed to [employer] # [phone]. Pt said her name, DOB, address needed to be included on prescription. Pt request cb when done.

## 2013-09-27 ENCOUNTER — Other Ambulatory Visit: Payer: Self-pay | Admitting: *Deleted

## 2013-09-27 MED ORDER — FLUTICASONE-SALMETEROL 250-50 MCG/DOSE IN AEPB: 1.0000 | INHALATION_SPRAY | Freq: Two times a day (BID) | RESPIRATORY_TRACT | Status: DC

## 2013-09-28 ENCOUNTER — Other Ambulatory Visit: Payer: Self-pay | Admitting: Family Medicine

## 2013-10-16 ENCOUNTER — Telehealth: Payer: Self-pay | Admitting: Family Medicine

## 2013-10-16 ENCOUNTER — Encounter: Payer: Self-pay | Admitting: Family Medicine

## 2013-10-16 ENCOUNTER — Ambulatory Visit (INDEPENDENT_AMBULATORY_CARE_PROVIDER_SITE_OTHER): Payer: Medicare Other | Admitting: Family Medicine

## 2013-10-16 VITALS — BP 114/68 | HR 75 | Temp 98.2°F | Ht 59.53 in | Wt 175.8 lb

## 2013-10-16 DIAGNOSIS — Z23 Encounter for immunization: Secondary | ICD-10-CM

## 2013-10-16 DIAGNOSIS — E039 Hypothyroidism, unspecified: Secondary | ICD-10-CM

## 2013-10-16 DIAGNOSIS — F329 Major depressive disorder, single episode, unspecified: Secondary | ICD-10-CM

## 2013-10-16 DIAGNOSIS — F3289 Other specified depressive episodes: Secondary | ICD-10-CM

## 2013-10-16 DIAGNOSIS — Z0000 Encounter for general adult medical examination without abnormal findings: Secondary | ICD-10-CM

## 2013-10-16 DIAGNOSIS — I10 Essential (primary) hypertension: Secondary | ICD-10-CM

## 2013-10-16 DIAGNOSIS — R5383 Other fatigue: Secondary | ICD-10-CM

## 2013-10-16 DIAGNOSIS — E7800 Pure hypercholesterolemia, unspecified: Secondary | ICD-10-CM

## 2013-10-16 DIAGNOSIS — R5381 Other malaise: Secondary | ICD-10-CM

## 2013-10-16 LAB — CBC WITH DIFFERENTIAL/PLATELET
Basophils Absolute: 0.1 10*3/uL (ref 0.0–0.1)
Basophils Relative: 0.8 % (ref 0.0–3.0)
Eosinophils Absolute: 0.2 10*3/uL (ref 0.0–0.7)
Eosinophils Relative: 2.4 % (ref 0.0–5.0)
HCT: 42.3 % (ref 36.0–46.0)
Hemoglobin: 14 g/dL (ref 12.0–15.0)
Lymphocytes Relative: 34.3 % (ref 12.0–46.0)
Lymphs Abs: 3 10*3/uL (ref 0.7–4.0)
MCHC: 33.1 g/dL (ref 30.0–36.0)
MCV: 89.9 fl (ref 78.0–100.0)
Monocytes Absolute: 0.6 10*3/uL (ref 0.1–1.0)
Monocytes Relative: 7.2 % (ref 3.0–12.0)
Neutro Abs: 4.9 10*3/uL (ref 1.4–7.7)
Neutrophils Relative %: 55.3 % (ref 43.0–77.0)
Platelets: 409 10*3/uL — ABNORMAL HIGH (ref 150.0–400.0)
RBC: 4.71 Mil/uL (ref 3.87–5.11)
RDW: 14.3 % (ref 11.5–15.5)
WBC: 8.8 10*3/uL (ref 4.0–10.5)

## 2013-10-16 LAB — COMPREHENSIVE METABOLIC PANEL
ALT: 19 U/L (ref 0–35)
AST: 16 U/L (ref 0–37)
Albumin: 4 g/dL (ref 3.5–5.2)
Alkaline Phosphatase: 82 U/L (ref 39–117)
BUN: 14 mg/dL (ref 6–23)
CO2: 27 mEq/L (ref 19–32)
Calcium: 9.1 mg/dL (ref 8.4–10.5)
Chloride: 101 mEq/L (ref 96–112)
Creatinine, Ser: 0.9 mg/dL (ref 0.4–1.2)
GFR: 70.38 mL/min (ref >60.00)
Glucose, Bld: 79 mg/dL (ref 70–99)
Potassium: 4.5 mEq/L (ref 3.5–5.1)
Sodium: 136 mEq/L (ref 135–145)
Total Bilirubin: 0.2 mg/dL (ref 0.2–1.2)
Total Protein: 7.4 g/dL (ref 6.0–8.3)

## 2013-10-16 LAB — TSH: TSH: 0.15 u[IU]/mL — ABNORMAL LOW (ref 0.35–4.50)

## 2013-10-16 LAB — T3, FREE: T3, Free: 2.4 pg/mL (ref 2.3–4.2)

## 2013-10-16 LAB — VITAMIN B12: Vitamin B-12: >1500 pg/mL — ABNORMAL HIGH (ref 211–911)

## 2013-10-16 LAB — LIPID PANEL
Cholesterol: 216 mg/dL — ABNORMAL HIGH (ref 0–200)
HDL: 28.2 mg/dL — ABNORMAL LOW (ref >39.00)
LDL Cholesterol: 141 mg/dL — ABNORMAL HIGH (ref 0–99)
Total CHOL/HDL Ratio: 8
Triglycerides: 236 mg/dL — ABNORMAL HIGH (ref 0.0–149.0)
VLDL: 47.2 mg/dL — ABNORMAL HIGH (ref 0.0–40.0)

## 2013-10-16 LAB — T4, FREE: Free T4: 1.22 ng/dL (ref 0.60–1.60)

## 2013-10-16 MED ORDER — THYROID 60 MG PO TABS: 60.0000 mg | ORAL_TABLET | Freq: Every day | ORAL | Status: DC

## 2013-10-16 NOTE — Telephone Encounter (Signed)
Relevant patient education mailed to patient.  

## 2013-10-16 NOTE — Assessment & Plan Note (Signed)
Change to armour thyroid.  Follow closely. Discussed with pt that this may not be as standardized as synthroid,  But she states she felt much better on it.

## 2013-10-16 NOTE — Assessment & Plan Note (Signed)
Eval with labs. She feels her thyroid is not well controlled.

## 2013-10-16 NOTE — Patient Instructions (Addendum)
Stop at labs.  Stop synthroid.  Start armour thyroid 60 mg. Return in 4 weeks for Follow up with labs prior TSH, free t3 and free t4 check. Can try holding cholesterol med for 1-2 weeks but if not feeling a lot better, restart given your history of stroke.  Call to schedule mammogram on your own.

## 2013-10-16 NOTE — Progress Notes (Signed)
Subjective:    Patient ID: Tiffany Orozco, female    DOB: 10-13-1950, 63 y.o.   MRN: [id number]  HPI I have personally reviewed the Medicare Annual Wellness questionnaire and have noted 1. The patient's medical and social history 2. Their use of alcohol, tobacco or illicit drugs 3. Their current medications and supplements 4. The patient's functional ability including ADL's, fall risks, home safety risks and hearing or visual             impairment. 5. Diet and physical activities 6. Evidence for depression or mood disorders The patients weight, height, BMI and visual acuity have been recorded in the chart I have made referrals, counseling and provided education to the patient based review of the above and I have provided the pt with a written personalized care plan for preventive services.  She does not feel well at all.  She has no energy. She feels like she is on too many meds.  Depression, moderate control.. She feels effexor does not help at all.  If she is not feeling better she wants to change this med.   Hypertension:   Well controlled on amlodipine. She would like to stop  BP Readings from Last 3 Encounters:  10/16/13 114/68  09/11/13 126/72  08/07/13 140/70  Using medication without problems or lightheadedness:  Occ Chest pain with exertion:None Edema:None Short of breath: None Average home BPs: well controlled Other issues:  Hypothyroid: Stable on levothyroxine at last check. She has been very fatigued. She wants to get back on armour thyroid.  Lab Results  Component Value Date   TSH 0.25* 12/29/2012    Elevated Cholesterol: HDL low, but LDL almost at goal <70 on simvastatin at last check.  Lab Results  Component Value Date   CHOL 143 12/29/2012   HDL 32.00* 12/29/2012   LDLCALC 90 12/29/2012   LDLDIRECT 187.9 06/22/2010   TRIG 107.0 12/29/2012   CHOLHDL 4 12/29/2012  Using medications without problems:None  Muscle aches: None  Diet compliance: poor appetite, has  to avoid a lot things due to stomach irritation ( greasy, dairy, sweet things) Exercise: None due to hip pain Other complaints:  Wt Readings from Last 3 Encounters:  10/16/13 175 lb 12 oz (79.72 kg)  09/11/13 176 lb (79.833 kg)  08/07/13 175 lb 4 oz (79.493 kg)      Review of Systems  Constitutional: Positive for fatigue. Negative for fever and unexpected weight change.  HENT: Negative for congestion, ear pain, sinus pressure, sneezing, sore throat and trouble swallowing.   Eyes: Negative for pain and itching.  Respiratory: Negative for cough, shortness of breath and wheezing.   Cardiovascular: Negative for chest pain, palpitations and leg swelling.  Gastrointestinal: Negative for nausea, abdominal pain, diarrhea, constipation and blood in stool.  Genitourinary: Negative for dysuria, hematuria, vaginal discharge, difficulty urinating and menstrual problem.  Skin: Negative for rash.  Neurological: Negative for syncope, weakness, light-headedness, numbness and headaches.  Psychiatric/Behavioral: Negative for confusion and dysphoric mood. The patient is not nervous/anxious.        Objective:   Physical Exam  Constitutional: Vital signs are normal. She appears well-developed and well-nourished. She is cooperative.  Non-toxic appearance. She does not appear ill. No distress.  HENT:  Head: Normocephalic.  Right Ear: Hearing, tympanic membrane, external ear and ear canal normal.  Left Ear: Hearing, tympanic membrane, external ear and ear canal normal.  Nose: Nose normal.  Eyes: Conjunctivae, EOM and lids are normal. Pupils are equal, round,  and reactive to light. Lids are everted and swept, no foreign bodies found.  Neck: Trachea normal and normal range of motion. Neck supple. Carotid bruit is not present. No mass and no thyromegaly present.  Cardiovascular: Normal rate, regular rhythm, S1 normal, S2 normal, normal heart sounds and intact distal pulses.  Exam reveals no gallop.   No  murmur heard. Pulmonary/Chest: Effort normal and breath sounds normal. No respiratory distress. She has no wheezes. She has no rhonchi. She has no rales.  Abdominal: Soft. Normal appearance and bowel sounds are normal. She exhibits no distension, no fluid wave, no abdominal bruit and no mass. There is no hepatosplenomegaly. There is no tenderness. There is no rebound, no guarding and no CVA tenderness. No hernia.  Genitourinary: Vagina normal. No breast swelling, tenderness, discharge or bleeding. Pelvic exam was performed with patient supine. There is no rash, tenderness or lesion on the right labia. There is no rash, tenderness or lesion on the left labia. Uterus is not enlarged and not tender. Right adnexum displays no mass, no tenderness and no fullness. Left adnexum displays no mass, no tenderness and no fullness.  Uterine prolapse, chronic per pt, asymptomatic.  Lymphadenopathy:    She has no cervical adenopathy.    She has no axillary adenopathy.  Neurological: She is alert. She has normal strength. No cranial nerve deficit or sensory deficit.  Skin: Skin is warm, dry and intact. No rash noted.  Psychiatric: Her speech is normal and behavior is normal. Judgment normal. Her mood appears not anxious. Cognition and memory are normal. She does not exhibit a depressed mood.          Assessment & Plan:  The patient's preventative maintenance and recommended screening tests for an annual wellness exam were reviewed in full today. Brought up to date unless services declined.  Counselled on the importance of diet, exercise, and its role in overall health and mortality. The patient's FH and SH was reviewed, including their home life, tobacco status, and drug and alcohol status.   Vaccines: Uptodate with flu, Td, shingles, due for PNA PAP/DVE:nml in 2013, every 3 years..next due in 2016. Mammo: nml 10/2012 DXA: osteopenia 06/2011.. no significant change.  Colon: 04/2010, Dr. [name], nml, repeat  in 10 years  Smoker:precontemplative  nml EKG in 07/2013.

## 2013-10-16 NOTE — Assessment & Plan Note (Signed)
She wishes to try a trial of statin, may try if not feeling better with new thyroid med. Due for recheck.

## 2013-10-16 NOTE — Assessment & Plan Note (Signed)
Moderate contro;l. If not feeling better with current changes and labs nml. She wishes to stop effexor.

## 2013-10-16 NOTE — Progress Notes (Signed)
Pre visit review using our clinic review tool, if applicable. No additional management support is needed unless otherwise documented below in the visit note. 

## 2013-10-17 ENCOUNTER — Other Ambulatory Visit: Payer: Self-pay | Admitting: Family Medicine

## 2013-10-17 LAB — VITAMIN D 25 HYDROXY (VIT D DEFICIENCY, FRACTURES): Vit D, 25-Hydroxy: 50 ng/mL (ref 30–89)

## 2013-11-06 ENCOUNTER — Other Ambulatory Visit (INDEPENDENT_AMBULATORY_CARE_PROVIDER_SITE_OTHER): Payer: Medicare Other

## 2013-11-06 DIAGNOSIS — D696 Thrombocytopenia, unspecified: Secondary | ICD-10-CM

## 2013-11-06 DIAGNOSIS — I10 Essential (primary) hypertension: Secondary | ICD-10-CM

## 2013-11-06 DIAGNOSIS — E7800 Pure hypercholesterolemia, unspecified: Secondary | ICD-10-CM

## 2013-11-06 DIAGNOSIS — J441 Chronic obstructive pulmonary disease with (acute) exacerbation: Secondary | ICD-10-CM

## 2013-11-06 DIAGNOSIS — R5383 Other fatigue: Secondary | ICD-10-CM

## 2013-11-06 DIAGNOSIS — R5381 Other malaise: Secondary | ICD-10-CM

## 2013-11-06 DIAGNOSIS — R0602 Shortness of breath: Secondary | ICD-10-CM

## 2013-11-06 LAB — COMPREHENSIVE METABOLIC PANEL
ALT: 16 U/L (ref 0–35)
AST: 19 U/L (ref 0–37)
Albumin: 3.8 g/dL (ref 3.5–5.2)
Alkaline Phosphatase: 73 U/L (ref 39–117)
BUN: 13 mg/dL (ref 6–23)
CO2: 23 mEq/L (ref 19–32)
Calcium: 8.7 mg/dL (ref 8.4–10.5)
Chloride: 101 mEq/L (ref 96–112)
Creatinine, Ser: 0.8 mg/dL (ref 0.4–1.2)
GFR: 77.61 mL/min (ref >60.00)
Glucose, Bld: 87 mg/dL (ref 70–99)
Potassium: 4.4 mEq/L (ref 3.5–5.1)
Sodium: 135 mEq/L (ref 135–145)
Total Bilirubin: 0.2 mg/dL (ref 0.2–1.2)
Total Protein: 7 g/dL (ref 6.0–8.3)

## 2013-11-06 LAB — TSH: TSH: 0.6 u[IU]/mL (ref 0.35–4.50)

## 2013-11-13 ENCOUNTER — Ambulatory Visit (INDEPENDENT_AMBULATORY_CARE_PROVIDER_SITE_OTHER): Payer: Medicare Other | Admitting: Family Medicine

## 2013-11-13 ENCOUNTER — Telehealth: Payer: Self-pay

## 2013-11-13 ENCOUNTER — Encounter: Payer: Self-pay | Admitting: Family Medicine

## 2013-11-13 VITALS — BP 126/72 | HR 85 | Temp 97.8°F | Wt 175.5 lb

## 2013-11-13 DIAGNOSIS — R5381 Other malaise: Secondary | ICD-10-CM

## 2013-11-13 DIAGNOSIS — E7800 Pure hypercholesterolemia, unspecified: Secondary | ICD-10-CM

## 2013-11-13 DIAGNOSIS — E039 Hypothyroidism, unspecified: Secondary | ICD-10-CM

## 2013-11-13 DIAGNOSIS — I10 Essential (primary) hypertension: Secondary | ICD-10-CM

## 2013-11-13 DIAGNOSIS — R5383 Other fatigue: Principal | ICD-10-CM

## 2013-11-13 MED ORDER — THYROID 81.25 MG PO TABS: 60.0000 mg | ORAL_TABLET | Freq: Every day | ORAL | Status: DC

## 2013-11-13 NOTE — Assessment & Plan Note (Signed)
Previously at goal on statin. She has stopped and now leg muscles feel better.

## 2013-11-13 NOTE — Progress Notes (Signed)
   Subjective:    Patient ID: Tiffany Orozco, female    DOB: 1950/09/05, 63 y.o.   MRN: [id number]  HPI  63 year old female with history of hypothyroidism and fatigue and general ill feeling presents for 1 month follow up. At last OV we changed her thyroid med to armour thyroid. Lab Results  Component Value Date   TSH 0.60 11/06/2013  Recent CMET nml.   Today she reports she has not had as much shortness of breath issue, she has had less headaches.  She say she muscles feel better off the statin medication.  BP Readings from Last 3 Encounters:  11/13/13 126/72  10/16/13 114/68  09/11/13 126/72  She wishes to stay of chol med and work on lifestyle changes.   Energy is better but not ideal.  Wt Readings from Last 3 Encounters:  11/13/13 175 lb 8 oz (79.606 kg)  10/16/13 175 lb 12 oz (79.72 kg)  09/11/13 176 lb (79.833 kg)      Review of Systems  Constitutional: Positive for fatigue. Negative for fever.  HENT: Negative for ear pain.   Eyes: Negative for pain.  Respiratory: Negative for chest tightness and shortness of breath.        She is breathing well and has stopped her inhalers.  Cardiovascular: Negative for chest pain, palpitations and leg swelling.  Gastrointestinal: Negative for abdominal pain.  Genitourinary: Negative for dysuria.       Objective:   Physical Exam  Constitutional: Vital signs are normal. She appears well-developed and well-nourished. She is cooperative.  Non-toxic appearance. She does not appear ill. No distress.  HENT:  Head: Normocephalic.  Right Ear: Hearing, tympanic membrane, external ear and ear canal normal.  Left Ear: Hearing, tympanic membrane, external ear and ear canal normal.  Nose: Nose normal.  Eyes: Conjunctivae, EOM and lids are normal. Pupils are equal, round, and reactive to light. Lids are everted and swept, no foreign bodies found.  Neck: Trachea normal and normal range of motion. Neck supple. Carotid bruit is not present.  No mass and no thyromegaly present.  Cardiovascular: Normal rate, regular rhythm, S1 normal, S2 normal, normal heart sounds and intact distal pulses.  Exam reveals no gallop.   No murmur heard. Pulmonary/Chest: Effort normal and breath sounds normal. No respiratory distress. She has no wheezes. She has no rhonchi. She has no rales.  Abdominal: Soft. Normal appearance and bowel sounds are normal. She exhibits no distension, no fluid wave, no abdominal bruit and no mass. There is no hepatosplenomegaly. There is no tenderness. There is no rebound, no guarding and no CVA tenderness. No hernia.  Genitourinary: Vagina normal. Pelvic exam was performed with patient supine. Uterus is not enlarged and not tender. Right adnexum displays no mass, no tenderness and no fullness. Left adnexum displays no mass, no tenderness and no fullness.     Lymphadenopathy:    She has no cervical adenopathy.    She has no axillary adenopathy.  Neurological: She is alert. She has normal strength. No cranial nerve deficit or sensory deficit.  Skin: Skin is warm, dry and intact. No rash noted.  Psychiatric: Her speech is normal and behavior is normal. Judgment normal. Her mood appears not anxious. Cognition and memory are normal. She does not exhibit a depressed mood.          Assessment & Plan:

## 2013-11-13 NOTE — Assessment & Plan Note (Signed)
Well controlled. Continue current medication.  

## 2013-11-13 NOTE — Progress Notes (Signed)
Pre visit review using our clinic review tool, if applicable. No additional management support is needed unless otherwise documented below in the visit note. 

## 2013-11-13 NOTE — Assessment & Plan Note (Signed)
Improved symptoms overall on armour thyroid. Increase some but recheck TSH , free t3 and t4 in 4 weeks.

## 2013-11-13 NOTE — Assessment & Plan Note (Addendum)
Improving.

## 2013-11-13 NOTE — Patient Instructions (Addendum)
Increase to 81 mg of armour thyroid. Return for labs only in 4 weeks. Schedule follow up with fasting lab sprior in 3 months.  Work aggressively on diet and exercise and weight loss.

## 2013-11-13 NOTE — Telephone Encounter (Signed)
Tiffany Orozco with [facility] said Armour thyroid 81.25 mg does not exist. Tiffany Orozco request cb.

## 2013-11-13 NOTE — Telephone Encounter (Signed)
This was dose listed in EMR.   What is the next dose up  from 60 mg daily?

## 2013-11-14 ENCOUNTER — Telehealth: Payer: Self-pay | Admitting: Family Medicine

## 2013-11-14 NOTE — Telephone Encounter (Signed)
I would recommend increasing to 90. Please send in new rx for this.

## 2013-11-14 NOTE — Telephone Encounter (Signed)
Pt left v/m requesting status of Armour thyroid; pt request cb at [phone].

## 2013-11-14 NOTE — Telephone Encounter (Signed)
Spoke with patient and she would like to know what to increase to? 81.25mg  does not exist they have 65mg  and 90mg . Please advise

## 2013-11-14 NOTE — Telephone Encounter (Signed)
Relevant patient education mailed to patient.  

## 2013-11-15 MED ORDER — THYROID 90 MG PO TABS: 90.0000 mg | ORAL_TABLET | Freq: Every day | ORAL | Status: DC

## 2013-11-15 NOTE — Telephone Encounter (Signed)
Spoke with patient and advised results rx sent to pharmacy by e-script  

## 2013-12-11 ENCOUNTER — Other Ambulatory Visit (INDEPENDENT_AMBULATORY_CARE_PROVIDER_SITE_OTHER): Payer: Medicare Other

## 2013-12-11 DIAGNOSIS — E039 Hypothyroidism, unspecified: Secondary | ICD-10-CM

## 2013-12-11 DIAGNOSIS — E7800 Pure hypercholesterolemia, unspecified: Secondary | ICD-10-CM

## 2013-12-11 LAB — COMPREHENSIVE METABOLIC PANEL
ALT: 18 U/L (ref 0–35)
AST: 18 U/L (ref 0–37)
Albumin: 3.8 g/dL (ref 3.5–5.2)
Alkaline Phosphatase: 78 U/L (ref 39–117)
BUN: 14 mg/dL (ref 6–23)
CO2: 27 mEq/L (ref 19–32)
Calcium: 9.2 mg/dL (ref 8.4–10.5)
Chloride: 100 mEq/L (ref 96–112)
Creatinine, Ser: 0.8 mg/dL (ref 0.4–1.2)
GFR: 78.74 mL/min (ref >60.00)
Glucose, Bld: 80 mg/dL (ref 70–99)
Potassium: 4.5 mEq/L (ref 3.5–5.1)
Sodium: 134 mEq/L — ABNORMAL LOW (ref 135–145)
Total Bilirubin: 0.5 mg/dL (ref 0.2–1.2)
Total Protein: 7.3 g/dL (ref 6.0–8.3)

## 2013-12-11 LAB — T3, FREE: T3, Free: 2.2 pg/mL — ABNORMAL LOW (ref 2.3–4.2)

## 2013-12-11 LAB — LIPID PANEL
Cholesterol: 193 mg/dL (ref 0–200)
HDL: 27 mg/dL — ABNORMAL LOW (ref >39.00)
LDL Cholesterol: 109 mg/dL — ABNORMAL HIGH (ref 0–99)
NonHDL: 166
Total CHOL/HDL Ratio: 7
Triglycerides: 283 mg/dL — ABNORMAL HIGH (ref 0.0–149.0)
VLDL: 56.6 mg/dL — ABNORMAL HIGH (ref 0.0–40.0)

## 2013-12-11 LAB — T4, FREE: Free T4: 0.71 ng/dL (ref 0.60–1.60)

## 2013-12-11 LAB — TSH: TSH: 0.53 u[IU]/mL (ref 0.35–4.50)

## 2013-12-14 ENCOUNTER — Telehealth: Payer: Self-pay | Admitting: Family Medicine

## 2013-12-14 MED ORDER — COLESEVELAM HCL 3.75 G PO PACK: 1.0000 | PACK | Freq: Every day | ORAL | Status: DC

## 2013-12-14 MED ORDER — THYROID 97.5 MG PO TABS: 90.0000 mg | ORAL_TABLET | Freq: Every day | ORAL | Status: DC

## 2013-12-14 NOTE — Telephone Encounter (Signed)
Message copied by Jinny Sanders on Fri Dec 14, 2013  5:58 PM ------      Message from: Carter Kitten      Created: Fri Dec 14, 2013  5:45 PM       Mrs. Tiffany Orozco notified as instructed by telephone.  She is willing to try the [facility] as long as it isn't too expensive.  She would like to also go ahead and increase her thyroid medication also.      Please send prescriptions in to her pharmacy. ------

## 2013-12-14 NOTE — Telephone Encounter (Signed)
Rx's sent in. °

## 2013-12-24 ENCOUNTER — Telehealth: Payer: Self-pay

## 2013-12-24 DIAGNOSIS — E039 Hypothyroidism, unspecified: Secondary | ICD-10-CM

## 2013-12-24 NOTE — Telephone Encounter (Signed)
Noted. Has she ever tried red yeast rice?

## 2013-12-24 NOTE — Telephone Encounter (Signed)
i will forward to my partner per request

## 2013-12-24 NOTE — Telephone Encounter (Signed)
Pt left v/m; pt started welchol on 12/22/13 and had nausea, diarrhea and swelling in face.Pt did not have swelling in throat. Pt still has diarrhea and swelling around eyes (not as bad as when first took med. Pt stopped Welchol on 12/22/13. Pt does not have any money at this time and wants to wait a week or so before trying a different medicine. Pt request cb. Pt wants to wait on suggestions from Dr Diona Browner instead of another doctor.

## 2013-12-25 MED ORDER — HYDROCHLOROTHIAZIDE 25 MG PO TABS: 25.0000 mg | ORAL_TABLET | Freq: Every day | ORAL | Status: DC

## 2013-12-25 NOTE — Telephone Encounter (Signed)
Sent in HCTZ to use prn for swelling.

## 2013-12-25 NOTE — Telephone Encounter (Signed)
Spoke with Mrs. Tiffany Orozco.  She has not tried [employer].  Recommended that she get the Red Yeast Rice 600 mg and take two tablets twice a day (1200 mg bid).  She is also asking if Dr. Diona Browner would send in a prescription for a fluid pill.  She is having a lot of swelling and has been taking her husbands fluid pills and it seems to be helping her.  Please advise.

## 2013-12-26 NOTE — Telephone Encounter (Signed)
Tiffany Orozco notified prescription for fluid pill has been sent to her pharmacy.  Patient states when she picked up her thyroid medication at the pharmacy it had been changed from Armour thyroid to Nature's thyroid.  Tiffany Orozco is inquiring since this is a different brand of thyroid medication should she have her thyroid levels rechecked.  If so, when?

## 2013-12-30 ENCOUNTER — Other Ambulatory Visit: Payer: Self-pay | Admitting: Family Medicine

## 2013-12-30 NOTE — Telephone Encounter (Signed)
Yes, she is right. Have her scheduled for tsh in 4 weeks.

## 2013-12-31 NOTE — Telephone Encounter (Signed)
Ms. Acre notified to recheck TSH in 4 weeks.  Patient is already scheduled for labs on 02/08/2014 and a follow up with Dr. Diona Browner on 02/15/2014.  I felt it was fine to wait until that appointment.  Will forward to Dr. Diona Browner to enter lab orders.

## 2014-01-01 NOTE — Telephone Encounter (Signed)
Agreed -

## 2014-02-08 ENCOUNTER — Other Ambulatory Visit (INDEPENDENT_AMBULATORY_CARE_PROVIDER_SITE_OTHER): Payer: Medicare Other

## 2014-02-08 DIAGNOSIS — E039 Hypothyroidism, unspecified: Secondary | ICD-10-CM

## 2014-02-08 LAB — TSH: TSH: 1.37 u[IU]/mL (ref 0.35–4.50)

## 2014-02-13 ENCOUNTER — Encounter: Payer: Self-pay | Admitting: Internal Medicine

## 2014-02-13 ENCOUNTER — Ambulatory Visit (INDEPENDENT_AMBULATORY_CARE_PROVIDER_SITE_OTHER): Payer: Medicare Other | Admitting: Internal Medicine

## 2014-02-13 VITALS — BP 122/70 | HR 81 | Temp 97.5°F | Resp 16 | Wt 181.0 lb

## 2014-02-13 DIAGNOSIS — J209 Acute bronchitis, unspecified: Secondary | ICD-10-CM

## 2014-02-13 MED ORDER — BENZONATATE 200 MG PO CAPS: 200.0000 mg | ORAL_CAPSULE | Freq: Three times a day (TID) | ORAL | Status: DC | PRN

## 2014-02-13 MED ORDER — DOXYCYCLINE HYCLATE 100 MG PO TABS: 100.0000 mg | ORAL_TABLET | Freq: Two times a day (BID) | ORAL | Status: DC

## 2014-02-13 NOTE — Progress Notes (Signed)
Subjective:    Patient ID: Tiffany Orozco, female    DOB: 1951/02/19, 63 y.o.   MRN: [id number]  HPI Ran out of allergy meds--cetirizine Gets sick anytime she doesn't take it  Started getting sore in chest in past 1-2 days Increasing cough--with secondary gagging and nausea Thick purulent sputum at times Some SOB with cough   Initial ill feelings for about 5 days ?low grade fever Taking aleve Has bad sinus congestion in head Ears feel "swollen inside" Some chills at night--no sig sweat  Only other med is some BC's Will be getting the cetirizine daily  Not smoking while sick Discussed trying to stop  Current Outpatient Prescriptions on File Prior to Visit  Medication Sig Dispense Refill  . albuterol (PROVENTIL HFA;VENTOLIN HFA) 108 (90 BASE) MCG/ACT inhaler Inhale 2 puffs into the lungs every 6 (six) hours as needed for wheezing.  1 Inhaler  1  . amLODipine (NORVASC) 10 MG tablet TAKE 1 TABLET (10 MG TOTAL) BY MOUTH DAILY.  30 tablet  5  . cetirizine (ZYRTEC) 10 MG tablet Take 10 mg by mouth daily.      . Cholecalciferol (VITAMIN D-3) 1000 UNITS CAPS Take 2,000 Units by mouth daily.      Marland Kitchen dipyridamole-aspirin (AGGRENOX) 200-25 MG per 12 hr capsule TAKE 1 CAPSULE BY MOUTH 2 TIMES DAILY.  180 capsule  3  . Fluticasone-Salmeterol (ADVAIR DISKUS) 250-50 MCG/DOSE AEPB Inhale 1 puff into the lungs 2 (two) times daily at 10 AM and 5 PM.  90 each  2  . hydrochlorothiazide (HYDRODIURIL) 25 MG tablet Take 1 tablet (25 mg total) by mouth daily.  90 tablet  3  . pantoprazole (PROTONIX) 40 MG tablet TAKE 1 TABLET BY MOUTH ONCE A DAY  90 tablet  1  . thyroid 97.5 MG TABS Take 0.9231 tablets (90 mg total) by mouth daily.  90 tablet  3  . venlafaxine XR (EFFEXOR-XR) 75 MG 24 hr capsule TAKE 1 CAPSULE (75 MG TOTAL) BY MOUTH DAILY.  90 capsule  1   No current facility-administered medications on file prior to visit.    Allergies  Allergen Reactions  . Lisinopril     Shortness of  breath.  . Niacin     REACTION: dizziness  . Welchol [Colesevelam Hcl] Other (See Comments)    Diarrhea,Nausea, swelling in face  . Augmentin [Amoxicillin-Pot Clavulanate]     Nauseated. GI upset with taking.  . Codeine     keeps patient awake  . Oxycodone Hcl     Patient states this medication makes her hyper    Past Medical History  Diagnosis Date  . Anxiety   . Depression   . GERD (gastroesophageal reflux disease)   . Unspecified hypothyroidism   . Skin cancer of face   . Skin cancer of arm   . Allergy   . COPD (chronic obstructive pulmonary disease)   . Hyperlipidemia   . Hypertension   . Obesity   . DDD (degenerative disc disease), lumbar     mild  . Stroke 07/2010    Past Surgical History  Procedure Laterality Date  . Tubal ligation  1975  . Appendectomy    . Foot surgery  03/31/2009    R foot  . Emgs  7/04    atms and wrists neg  . Cholecystectomy  2004  . Foot surgery  2008, 2009, 2010    right foot     Family History  Problem Relation Age of Onset  .  Skin cancer Father   . Hypertension Mother     History   Social History  . Marital Status: Married    Spouse Name: Roxsana Riding    Number of Children: 2  . 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 Smoking status: Current Every Day Smoker -- 0.50 packs/day for 25 years    Types: Cigarettes  . Smokeless tobacco: Never Used  . Alcohol Use: No  . Drug Use: No  . Sexual Activity: Yes    Birth Control/ Protection: None   Other Topics Concern  . Not on file   Social History Narrative   5/10 husband had open heart surgery, not working, 2 grandchildren, siblings living and healthy   Review of Systems Feels bloated in abdomen    Objective:   Physical Exam  Constitutional: She appears well-developed. No distress.  Appears mildly uncomfortable  HENT:  Mouth/Throat: Oropharynx is clear and moist. No  oropharyngeal exudate.  No sinus tenderness TMs normal Moderate mostly pale nasal congestion  Neck: Normal range of motion. Neck supple. No thyromegaly present.  Pulmonary/Chest: Effort normal and breath sounds normal. No respiratory distress. She has no wheezes. She has no rales.  Lymphadenopathy:    She has no cervical adenopathy.          Assessment & Plan:

## 2014-02-13 NOTE — Patient Instructions (Signed)
Please stop smoking. You can try over the counter guaifenisin to loosen your cough. If you are getting worse in the next few days, start the doxycycline antibiotic.

## 2014-02-13 NOTE — Assessment & Plan Note (Signed)
Sinus symptoms that are likely allergic---is restarting the cetirizine Likely viral infection in bronchial tree--but high risk for secondary infection Will give doxy if worsens Benzonatate for cough

## 2014-02-13 NOTE — Progress Notes (Signed)
Pre visit review using our clinic review tool, if applicable. No additional management support is needed unless otherwise documented below in the visit note. 

## 2014-02-15 ENCOUNTER — Ambulatory Visit (INDEPENDENT_AMBULATORY_CARE_PROVIDER_SITE_OTHER): Payer: Medicare Other | Admitting: Family Medicine

## 2014-02-15 ENCOUNTER — Encounter: Payer: Self-pay | Admitting: Family Medicine

## 2014-02-15 VITALS — BP 122/70 | HR 85 | Temp 98.2°F | Ht 59.53 in | Wt 177.5 lb

## 2014-02-15 DIAGNOSIS — R112 Nausea with vomiting, unspecified: Secondary | ICD-10-CM

## 2014-02-15 DIAGNOSIS — E860 Dehydration: Secondary | ICD-10-CM | POA: Insufficient documentation

## 2014-02-15 DIAGNOSIS — J209 Acute bronchitis, unspecified: Secondary | ICD-10-CM

## 2014-02-15 DIAGNOSIS — E039 Hypothyroidism, unspecified: Secondary | ICD-10-CM

## 2014-02-15 MED ORDER — PROMETHAZINE HCL 25 MG PO TABS: 25.0000 mg | ORAL_TABLET | Freq: Three times a day (TID) | ORAL | Status: DC | PRN

## 2014-02-15 MED ORDER — HYDROCODONE-HOMATROPINE 5-1.5 MG/5ML PO SYRP: 5.0000 mL | ORAL_SOLUTION | Freq: Every evening | ORAL | Status: DC | PRN

## 2014-02-15 NOTE — Assessment & Plan Note (Signed)
Well controlled symtpoms and numbers on naturemade thyroid.

## 2014-02-15 NOTE — Progress Notes (Signed)
Subjective:    Patient ID: Tiffany Orozco, female    DOB: [birth year], 63 y.o.   MRN: [id number]  HPI   63 year old female with history of smoking, ? COPD presents with continued symptoms of cough chest pain.  She saw Dr. Silvio Pate  2 days ago, Dx acute bronchitis.  Felt likely viral, was her symptoms started with nausea x 1 week.  In last 2 days she has had emesis and diarrhea.ven doxy to start if not improving.  She reports today that  Cough and shortness of breath started 1 week ago.  She has continued to worsened.  Pain in chest in ribs B, R> L when she coughs and with deep breaths. She is mainly throwing up phlegm.  Sinus congestion and face pain. Fevers 101 on 2 days ago, has resolved since starting antibiotics.  She has been using tessalon perles. She has been on doxy for 2 days.  She has had trouble keeping this down due to nausea and emesis. She was able to keep food down last night. She is not able to drink much liquids.  She has conjunctival hemmorhage in left eye.   Review of Systems  Constitutional: Positive for fever. Negative for fatigue.  HENT: Negative for ear pain.   Eyes: Negative for pain.  Respiratory: Positive for shortness of breath. Negative for chest tightness.   Cardiovascular: Positive for chest pain. Negative for palpitations and leg swelling.  Gastrointestinal: Negative for abdominal pain.  Genitourinary: Negative for dysuria.       Objective:   Physical Exam  Constitutional: Vital signs are normal. She appears well-developed and well-nourished. She is cooperative.  Non-toxic appearance. She does not appear ill. No distress.  Sick appearing  HENT:  Head: Normocephalic.  Right Ear: Hearing, tympanic membrane, external ear and ear canal normal. Tympanic membrane is not erythematous, not retracted and not bulging.  Left Ear: Hearing, tympanic membrane, external ear and ear canal normal. Tympanic membrane is not erythematous, not retracted and not  bulging.  Nose: Mucosal edema and rhinorrhea present. Right sinus exhibits maxillary sinus tenderness. Right sinus exhibits no frontal sinus tenderness. Left sinus exhibits maxillary sinus tenderness. Left sinus exhibits no frontal sinus tenderness.  Mouth/Throat: Uvula is midline, oropharynx is clear and moist and mucous membranes are normal.  Eyes: Conjunctivae, EOM and lids are normal. Pupils are equal, round, and reactive to light. Lids are everted and swept, no foreign bodies found.  Neck: Trachea normal and normal range of motion. Neck supple. Carotid bruit is not present. No mass and no thyromegaly present.  Cardiovascular: Normal rate, regular rhythm, S1 normal, S2 normal, normal heart sounds, intact distal pulses and normal pulses.  Exam reveals no gallop and no friction rub.   No murmur heard. Pulmonary/Chest: Effort normal and breath sounds normal. Not tachypneic. No respiratory distress. She has no decreased breath sounds. She has no wheezes. She has no rhonchi. She has no rales. She exhibits tenderness.  Neurological: She is alert.  Skin: Skin is warm, dry and intact. No rash noted.  Psychiatric: Her speech is normal and behavior is normal. Judgment normal. Her mood appears not anxious. Cognition and memory are normal. She does not exhibit a depressed mood.    Left ey conjunctival hemmorhage      Assessment & Plan:  Acute broncitis  Vs atypical pneumonia. Will start phenergan to help with intake of fluids and to keep doxy down. Titrate upfluids consistnetly as able to treat mild dehydration.  Cough  suppressant prn. If not improving as expected consider CXR, lung exam clear today.

## 2014-02-15 NOTE — Progress Notes (Signed)
Pre visit review using our clinic review tool, if applicable. No additional management support is needed unless otherwise documented below in the visit note. 

## 2014-02-15 NOTE — Patient Instructions (Addendum)
Continue doxycycline 100 mg twice daily. Use phenergan every eight hours for nausea. Push fluids, small sips constantly. Hold on solid food until keeping fluids down consistently, then gradaully start back. Call if not improving as expected.

## 2014-02-19 ENCOUNTER — Telehealth: Payer: Self-pay | Admitting: *Deleted

## 2014-02-19 NOTE — Telephone Encounter (Signed)
Received faxes form [employer] for prior authorization for Promethazine 25 mg tablets & Hycodan cough syrup.  Forms completed by Dr. Diona Browner and faxed back to [employer] at [phone].  Will await outcome.

## 2014-02-21 ENCOUNTER — Other Ambulatory Visit: Payer: Self-pay | Admitting: Family Medicine

## 2014-02-22 ENCOUNTER — Telehealth: Payer: Self-pay | Admitting: Family Medicine

## 2014-03-22 ENCOUNTER — Ambulatory Visit (INDEPENDENT_AMBULATORY_CARE_PROVIDER_SITE_OTHER): Payer: Medicare Other

## 2014-03-22 DIAGNOSIS — Z23 Encounter for immunization: Secondary | ICD-10-CM

## 2014-03-27 ENCOUNTER — Other Ambulatory Visit: Payer: Self-pay | Admitting: Family Medicine

## 2014-04-01 ENCOUNTER — Encounter: Payer: Self-pay | Admitting: Family Medicine

## 2014-04-19 ENCOUNTER — Encounter: Payer: Self-pay | Admitting: Family Medicine

## 2014-04-19 ENCOUNTER — Telehealth: Payer: Self-pay | Admitting: Family Medicine

## 2014-04-19 ENCOUNTER — Ambulatory Visit (INDEPENDENT_AMBULATORY_CARE_PROVIDER_SITE_OTHER): Payer: Medicare Other | Admitting: Family Medicine

## 2014-04-19 VITALS — BP 142/93 | HR 73 | Temp 98.1°F | Ht 59.53 in | Wt 177.0 lb

## 2014-04-19 DIAGNOSIS — K219 Gastro-esophageal reflux disease without esophagitis: Secondary | ICD-10-CM

## 2014-04-19 DIAGNOSIS — R1310 Dysphagia, unspecified: Secondary | ICD-10-CM | POA: Insufficient documentation

## 2014-04-19 MED ORDER — ASPIRIN-DIPYRIDAMOLE ER 25-200 MG PO CP12: ORAL_CAPSULE | ORAL | Status: DC

## 2014-04-19 MED ORDER — ESOMEPRAZOLE MAGNESIUM 20 MG PO PACK: 40.0000 mg | PACK | Freq: Every day | ORAL | Status: DC

## 2014-04-19 NOTE — Progress Notes (Signed)
Pre visit review using our clinic review tool, if applicable. No additional management support is needed unless otherwise documented below in the visit note. 

## 2014-04-19 NOTE — Assessment & Plan Note (Signed)
Likely secondary to GERD and resulting throat irritation.

## 2014-04-19 NOTE — Patient Instructions (Addendum)
Stop any aleve or BC. Avoid alcohol, caffeine, chocolate, soda, citris, tomato, spicy.  Can use tylenol for pain.  Stop pantoprazole and change to nexium 2 tabs of 20 mg daily.  If not improving in 2 weeks call for referral to GI for endoscopy.  For fluid in your ears you can use flonase 2 sprays per nostril daily .

## 2014-04-19 NOTE — Telephone Encounter (Signed)
Spoke with pt and she does not sound SOB or in distress talking on phone; pt scheduled appt with Dr Diona Browner 4:15 pm today. Pt will cb prior to appt if condition changes or worsens.

## 2014-04-19 NOTE — Assessment & Plan Note (Signed)
Change to nexium. Samples given. Trial of 2 weeks 40 mg daily. Trigger avoidance. If not improving will likely need GI referral and endoscopy.

## 2014-04-19 NOTE — Telephone Encounter (Signed)
Pt needs to be seen here, work her in for me please.

## 2014-04-19 NOTE — Progress Notes (Signed)
   Subjective:    Patient ID: Tiffany Orozco, female    DOB: Mar 21, 1951, 63 y.o.   MRN: [id number]  HPI   63 year old female with history of GERD presents with new onset sore throat in last few days. When she eats/wswallows she feels liek she coughs or chokes. She has been belching more than usual. She has burning pain in throat and upper chest every time she eats. Feels like foreign body in throat that she cannot swallow.   No SOB.  She is currently on protonix 40 mg daily . Has been on this for years. She has tried tums, doesn;t helps. Gingerale helps throat. Nexium helped in past.  No new meds lately except armour thyroid. Uses aleve and BC daily in last few weeks for hip.   Has never had endoscopy     Review of Systems  Constitutional: Negative for fever and fatigue.  HENT: Negative for ear pain.   Eyes: Negative for pain.  Respiratory: Negative for chest tightness and shortness of breath.   Cardiovascular: Negative for chest pain, palpitations and leg swelling.  Gastrointestinal: Negative for abdominal pain.  Genitourinary: Negative for dysuria.       Objective:   Physical Exam  Constitutional: Vital signs are normal. She appears well-developed and well-nourished. She is cooperative.  Non-toxic appearance. She does not appear ill. No distress.  HENT:  Head: Normocephalic.  Right Ear: Hearing, tympanic membrane, external ear and ear canal normal. Tympanic membrane is not erythematous, not retracted and not bulging.  Left Ear: Hearing, tympanic membrane, external ear and ear canal normal. Tympanic membrane is not erythematous, not retracted and not bulging.  Nose: No mucosal edema or rhinorrhea. Right sinus exhibits no maxillary sinus tenderness and no frontal sinus tenderness. Left sinus exhibits no maxillary sinus tenderness and no frontal sinus tenderness.  Mouth/Throat: Uvula is midline, oropharynx is clear and moist and mucous membranes are normal.  Eyes:  Conjunctivae, EOM and lids are normal. Pupils are equal, round, and reactive to light. Lids are everted and swept, no foreign bodies found.  Neck: Trachea normal and normal range of motion. Neck supple. Carotid bruit is not present. No thyroid mass and no thyromegaly present.    Areas of fatty tissue  Cardiovascular: Normal rate, regular rhythm, S1 normal, S2 normal, normal heart sounds, intact distal pulses and normal pulses.  Exam reveals no gallop and no friction rub.   No murmur heard. Pulmonary/Chest: Effort normal and breath sounds normal. No tachypnea. No respiratory distress. She has no decreased breath sounds. She has no wheezes. She has no rhonchi. She has no rales.  Abdominal: Soft. Normal appearance and bowel sounds are normal. There is no tenderness.  Neurological: She is alert.  Skin: Skin is warm, dry and intact. No rash noted.  Psychiatric: Her speech is normal and behavior is normal. Judgment and thought content normal. Her mood appears not anxious. Cognition and memory are normal. She does not exhibit a depressed mood.          Assessment & Plan:

## 2014-04-19 NOTE — Telephone Encounter (Signed)
Patient Information:  Caller Name: Tiffany Orozco  Phone: [phone]  Patient: Tiffany Orozco  Gender: Female  DOB: 08-02-1950  Age: 63 Years  PCP: Tiffany Orozco (Family Practice)  Office Follow Up:  Does the office need to follow up with this patient?: Yes  Instructions For The Office: Sore throat, hoarseness  RN Note:  Patient is calling with c/o difficulty swallowing and hoarseness.  Decrease in fluid intake and now difficulty in breathing noted.  Symptoms  Reason For Call & Symptoms: acid reflux, hoarseness, sore throat pain  Reviewed Health History In EMR: Yes  Reviewed Medications In EMR: Yes  Reviewed Allergies In EMR: Yes  Reviewed Surgeries / Procedures: Yes  Date of Onset of Symptoms: 03/19/2014  Treatments Tried: Protonix  Treatments Tried Worked: No  Guideline(s) Used:  Sore Throat  Disposition Per Guideline:   Go to Office Now  Reason For Disposition Reached:   Difficulty breathing (per caller) but not severe  Advice Given:  Call Back If:  You become worse.  Patient Will Follow Care Advice:  YES

## 2014-05-14 ENCOUNTER — Telehealth: Payer: Self-pay

## 2014-05-14 ENCOUNTER — Other Ambulatory Visit: Payer: Self-pay | Admitting: Family Medicine

## 2014-05-14 NOTE — Telephone Encounter (Signed)
Okay to refill nexium as requested. BP was elevated last OV. Have her restart amlodipine.

## 2014-05-14 NOTE — Telephone Encounter (Signed)
Pt has been taking nexium samples and is working great; pt checked with her ins co and nexium 40 mg taking 1 daily is approved by ins co. Pt request 90 day supply of nexium 40 mg taking one daily to [facility]. Pt has not been taking amlodipine; pt cannot remember last time she took BP med.Pt does not know why she stopped amlodipine/ Pt wants to know if should get new refill of amlodipine or should pt come for appt; pt is not sure what BP is. Pt request cb.

## 2014-05-15 MED ORDER — ESOMEPRAZOLE MAGNESIUM 40 MG PO CPDR: 40.0000 mg | DELAYED_RELEASE_CAPSULE | Freq: Every day | ORAL | Status: DC

## 2014-05-15 NOTE — Telephone Encounter (Signed)
Tiffany Orozco notified prescription for Nexium has been sent to [place].  Also informed that Dr. Diona Browner does want her to restart her Amlodipine because her BP was elevated at her last office visit.  Patient states understanding.

## 2014-05-15 NOTE — Addendum Note (Signed)
Addended by: Carter Kitten on: 05/15/2014 11:36 AM   Modules accepted: Orders

## 2014-05-22 DIAGNOSIS — Z1231 Encounter for screening mammogram for malignant neoplasm of breast: Secondary | ICD-10-CM | POA: Diagnosis not present

## 2014-05-27 ENCOUNTER — Telehealth: Payer: Self-pay | Admitting: Family Medicine

## 2014-05-27 DIAGNOSIS — K219 Gastro-esophageal reflux disease without esophagitis: Secondary | ICD-10-CM

## 2014-05-27 DIAGNOSIS — R1310 Dysphagia, unspecified: Secondary | ICD-10-CM

## 2014-05-27 NOTE — Telephone Encounter (Signed)
Pt called and would like referral to Dr. [name] for recurring dysphasia/ GERD. Pt would like first available appointment due to worsening symptoms. Best number to call pt is [phone]/ lt

## 2014-05-28 ENCOUNTER — Telehealth: Payer: Self-pay | Admitting: Family Medicine

## 2014-05-28 NOTE — Telephone Encounter (Signed)
Received medical records from [facility]. Sent to Dr. Diona Browner. 05/28/14/ss

## 2014-05-29 ENCOUNTER — Encounter: Payer: Self-pay | Admitting: Family Medicine

## 2014-06-07 ENCOUNTER — Encounter: Payer: Self-pay | Admitting: Physician Assistant

## 2014-06-11 ENCOUNTER — Encounter: Payer: Self-pay | Admitting: Gastroenterology

## 2014-06-12 ENCOUNTER — Telehealth: Payer: Self-pay | Admitting: *Deleted

## 2014-06-12 ENCOUNTER — Encounter: Payer: Self-pay | Admitting: Physician Assistant

## 2014-06-12 ENCOUNTER — Ambulatory Visit (INDEPENDENT_AMBULATORY_CARE_PROVIDER_SITE_OTHER): Payer: Medicare Other | Admitting: Physician Assistant

## 2014-06-12 ENCOUNTER — Other Ambulatory Visit (INDEPENDENT_AMBULATORY_CARE_PROVIDER_SITE_OTHER): Payer: Medicare Other

## 2014-06-12 VITALS — BP 136/70 | HR 86 | Ht 60.0 in | Wt 173.8 lb

## 2014-06-12 DIAGNOSIS — K219 Gastro-esophageal reflux disease without esophagitis: Secondary | ICD-10-CM

## 2014-06-12 DIAGNOSIS — R1013 Epigastric pain: Secondary | ICD-10-CM

## 2014-06-12 DIAGNOSIS — R1314 Dysphagia, pharyngoesophageal phase: Secondary | ICD-10-CM

## 2014-06-12 DIAGNOSIS — R110 Nausea: Secondary | ICD-10-CM

## 2014-06-12 LAB — COMPREHENSIVE METABOLIC PANEL
ALT: 17 U/L (ref 0–35)
AST: 16 U/L (ref 0–37)
Albumin: 4.3 g/dL (ref 3.5–5.2)
Alkaline Phosphatase: 101 U/L (ref 39–117)
BUN: 13 mg/dL (ref 6–23)
CO2: 27 mEq/L (ref 19–32)
Calcium: 9.5 mg/dL (ref 8.4–10.5)
Chloride: 96 mEq/L (ref 96–112)
Creatinine, Ser: 0.82 mg/dL (ref 0.40–1.20)
GFR: 74.21 mL/min (ref >60.00)
Glucose, Bld: 89 mg/dL (ref 70–99)
Potassium: 4.2 mEq/L (ref 3.5–5.1)
Sodium: 131 mEq/L — ABNORMAL LOW (ref 135–145)
Total Bilirubin: 0.3 mg/dL (ref 0.2–1.2)
Total Protein: 7.9 g/dL (ref 6.0–8.3)

## 2014-06-12 LAB — CBC WITH DIFFERENTIAL/PLATELET
Basophils Absolute: 0.1 10*3/uL (ref 0.0–0.1)
Basophils Relative: 0.8 % (ref 0.0–3.0)
Eosinophils Absolute: 0.1 10*3/uL (ref 0.0–0.7)
Eosinophils Relative: 1.7 % (ref 0.0–5.0)
HCT: 43.5 % (ref 36.0–46.0)
Hemoglobin: 14.4 g/dL (ref 12.0–15.0)
Lymphocytes Relative: 32.8 % (ref 12.0–46.0)
Lymphs Abs: 2.9 10*3/uL (ref 0.7–4.0)
MCHC: 33.1 g/dL (ref 30.0–36.0)
MCV: 86.4 fl (ref 78.0–100.0)
Monocytes Absolute: 0.7 10*3/uL (ref 0.1–1.0)
Monocytes Relative: 8 % (ref 3.0–12.0)
Neutro Abs: 5 10*3/uL (ref 1.4–7.7)
Neutrophils Relative %: 56.7 % (ref 43.0–77.0)
Platelets: 433 10*3/uL — ABNORMAL HIGH (ref 150.0–400.0)
RBC: 5.03 Mil/uL (ref 3.87–5.11)
RDW: 15.3 % (ref 11.5–15.5)
WBC: 8.8 10*3/uL (ref 4.0–10.5)

## 2014-06-12 LAB — LIPASE: Lipase: 11 U/L (ref 11.0–59.0)

## 2014-06-12 LAB — AMYLASE: Amylase: 38 U/L (ref 27–131)

## 2014-06-12 MED ORDER — PANTOPRAZOLE SODIUM 40 MG PO TBEC: 40.0000 mg | DELAYED_RELEASE_TABLET | Freq: Every day | ORAL | Status: DC

## 2014-06-12 NOTE — Telephone Encounter (Signed)
  06/12/2014   RE: Tiffany Orozco DOB: 03/11/1951 MRN: [id number]   Dear  Marlise Eves    We have scheduled the above patient for an endoscopic procedure. Our records show that she is on anticoagulation therapy.   Please advise as to how long the patient may come off her therapy of Aggrenox prior to the procedure, which is scheduled for 07/03/2014.  Please fax back/ or route the completed form to [facility] at [phone].   Sincerely,    Genella Mech

## 2014-06-12 NOTE — Progress Notes (Signed)
Patient ID: [name], female   DOB: 01-27-1951, 64 y.o.   MRN: [id number]    HPI:    Mrs. Kelson is a 64 year old female referred for evaluation by Dr. Diona Browner due to dysphagia and epigastric pain.  Mrs. Westrich says that she has been having trouble swallowing solids especially meats and dry foods for approximately 2 years. She states the symptoms have intensified over the past 3 months where she really has to eat slowly and ice her food up very small. She constantly feels as if she has a lot of phlegm in her throat and feels that food gets stuck in the back of her throat. She reports that she had a stroke a few years ago and has some right sided weakness when she is tired. She says her swallowing problems did not start with her stroke.  She complains of heartburn daily for the past year. Recently she was started on Nexium and feels it is not helping at all. She is nauseous fairly constantly for the past 4 months. She has epigastric pain which is worse after meals. She has no right upper quadrant pain. She had a cholecystectomy approximately 5 years ago. Her appetite has been decreased but her weight has been stable. She feels excessively bloated and gassy. She reports that she gets crampy lower abdominal pain prior to defecation, relieved with defecation. She is unable to lie flat or she gets significant regurgitation and will choke on fluid coming up. She has been sleeping in a reclining chair for the past 2 years. She sometimes coughs and gets a piece of old food coming out. She feels as if she constantly has to have something to drink. She also feels that the regurgitation she has is eroding her teeth and she has lost 3 crowns recently.   Past Medical History  Diagnosis Date  . Anxiety   . Depression   . GERD (gastroesophageal reflux disease)   . Hypothyroidism   . Skin cancer of face   . Skin cancer of arm   . Allergy   . COPD (chronic obstructive pulmonary disease)   . Hyperlipidemia    . Hypertension   . Obesity   . DDD (degenerative disc disease), lumbar     mild  . Stroke 07/2010  . Osteopenia   . Heart murmur   . Thrombocytopenia   . Female rectocele without uterine prolapse     Past Surgical History  Procedure Laterality Date  . Tubal ligation  1975  . Appendectomy    . Foot surgery  03/31/2009    R foot  . Emgs  7/04    atms and wrists neg  . Cholecystectomy  2004  . Foot surgery  2008, 2009, 2010    right foot    Family History  Problem Relation Age of Onset  . Skin cancer Father   . Hypertension Mother    History  Substance Use Topics  . Smoking status: Current Every Day Smoker -- 0.50 packs/day for 25 years    Types: Cigarettes  . Smokeless tobacco: Never Used  . Alcohol Use: No   Current Outpatient Prescriptions  Medication Sig Dispense Refill  . AMBULATORY NON FORMULARY MEDICATION Take by mouth daily. Medication Name: Sherri Sear    . AMBULATORY NON FORMULARY MEDICATION Take by mouth daily. Medication Name: BC powder    . amLODipine (NORVASC) 10 MG tablet TAKE 1 TABLET (10 MG TOTAL) BY MOUTH DAILY. 30 tablet 5  . cetirizine (ZYRTEC) 10 MG  tablet Take 10 mg by mouth daily.    . Cholecalciferol (VITAMIN D-3) 1000 UNITS CAPS Take 2,000 Units by mouth daily.    Marland Kitchen dipyridamole-aspirin (AGGRENOX) 200-25 MG per 12 hr capsule TAKE 1 CAPSULE BY MOUTH 2 TIMES DAILY. 180 capsule 3  . hydrochlorothiazide (HYDRODIURIL) 25 MG tablet Take 1 tablet (25 mg total) by mouth daily. 90 tablet 3  . thyroid 97.5 MG TABS Take 0.9231 tablets (90 mg total) by mouth daily. 90 tablet 3  . venlafaxine XR (EFFEXOR-XR) 75 MG 24 hr capsule TAKE 1 CAPSULE (75 MG TOTAL) BY MOUTH DAILY. 90 capsule 1  . pantoprazole (PROTONIX) 40 MG tablet Take 1 tablet (40 mg total) by mouth daily. 30 minutes before breakfast 30 tablet 3   No current facility-administered medications for this visit.   Allergies  Allergen Reactions  . Lisinopril     Shortness of breath.  . Niacin      REACTION: dizziness  . Welchol [Colesevelam Hcl] Other (See Comments)    Diarrhea,Nausea, swelling in face  . Augmentin [Amoxicillin-Pot Clavulanate]     Nauseated. GI upset with taking.  . Codeine     keeps patient awake  . Oxycodone Hcl     Patient states this medication makes her hyper     Review of Systems: Gen: Denies any fever, chills, sweats, anorexia, fatigue, weakness, malaise, weight loss, and sleep disorder CV: Denies chest pain, angina, palpitations, syncope, orthopnea, PND, peripheral edema, and claudication. Resp: Denies dyspnea at rest, dyspnea with exercise, cough, sputum, wheezing, coughing up blood, and pleurisy. GI: Denies vomiting blood, jaundice, and fecal incontinence.   Has dysphagia to solids. GU : Denies urinary burning, blood in urine, urinary frequency, urinary hesitancy, nocturnal urination, and urinary incontinence. MS: Denies joint pain, limitation of movement, and swelling, stiffness, low back pain, extremity pain. Denies muscle weakness, cramps, atrophy.  Derm: Denies rash, itching, dry skin, hives, moles, warts, or unhealing ulcers.  Psych: Denies depression, anxiety, memory loss, suicidal ideation, hallucinations, paranoia, and confusion. Heme: Denies bruising, bleeding, and enlarged lymph nodes. Neuro:  Denies any headaches, dizziness, paresthesias. Endo:  Denies any problems with DM, thyroid, adrenal function     Prior Endoscopies:   Colonoscopy 05/05/2010 was a normal exam.  Physical Exam: BP 136/70 mmHg  Pulse 86  Ht 5' (1.524 m)  Wt 173 lb 12.8 oz (78.835 kg)  BMI 33.94 kg/m2  SpO2 95% Constitutional: Pleasant,well-developed female in no acute distress. HEENT: Normocephalic and atraumatic. Conjunctivae are normal. No scleral icterus. Neck supple. No thyromegaly Cardiovascular: Normal rate, regular rhythm.  Pulmonary/chest: Effort normal and breath sounds normal. No wheezing, rales or rhonchi. Abdominal: Soft, nondistended, nontender.  Bowel sounds active throughout. There are no masses palpable. No hepatomegaly. Extremities: no edema Lymphadenopathy: No cervical adenopathy noted. Neurological: Alert and oriented to person place and time. Skin: Skin is warm and dry. No rashes noted. Psychiatric: Normal mood and affect. Behavior is normal.  ASSESSMENT AND PLAN: 64 year old female with a 2 year history of progressive dysphagia, and a 4 month history of epigastric pain and nausea referred for evaluation. An abdominal ultrasound will be obtained to evaluate for any possible ductal dilatation. A CBC, comprehensive metabolic panel, amylase, and lipase will be obtained as well. Will be scheduled for a modified barium swallow with speech pathology to evaluate for possible Zenker's, transfer dysphagia, or esophageal stricture. As she has not had relief with Nexium, she will discontinue Nexium and be given a trial of pantoprazole 40 mg by mouth every  morning 30 minutes prior to breakfast. She's been instructed to discontinue use of her Goody powders and BC powders. Instructed to adhere to an antireflux regimen. She will also be scheduled for an EGD with possible dilation.The risks, benefits, and alternatives to endoscopy with possible biopsy and possible dilation were discussed with the patient and they consent to proceed. The risk of holding anticoagulation therapy or antiplatelet medications was discussed including the increased risk for thromboembolic disease that may include DVT, pulmonary emboli and stroke. The patient understands this risk and is willing to proceed with temporally holding the medication provided that this is approved by her PCP or cardiologist.The procedure will be scheduled with Dr. [name]. Further recommendations will be made pending the findings of the above tests.    Darreon Lutes, Vita Barley PA-C 06/12/2014, 3:53 PM

## 2014-06-12 NOTE — Patient Instructions (Addendum)
You have been scheduled for an abdominal ultrasound at [facility] Radiology (1st floor of hospital) on 06/14/2014 at 10:30AM. Please arrive 15 minutes prior to your appointment for registration. Make certain not to have anything to eat or drink 6 hours prior to your appointment. Should you need to reschedule your appointment, please contact radiology at [phone]. This test typically takes about 30 minutes to perform.  Your Modified Barium Swallow is scheduled on 06/21/2014 at 1pm at [place] have been scheduled for an endoscopy. Please follow written instructions given to you at your visit today. If you use inhalers (even only as needed), please bring them with you on the day of your procedure. Your physician has requested that you go to www.startemmi.com and enter the access code given to you at your visit today. This web site gives a general overview about your procedure. However, you should still follow specific instructions given to you by our office regarding your preparation for the procedure.   Stop Nexium Stop BC powders/Goodies Go to the basement for labs We will contact you about holding your Aggrenox We have sent Protonix to your pharmacy Gastroesophageal Reflux Disease, Adult Gastroesophageal reflux disease (GERD) happens when acid from your stomach flows up into the esophagus. When acid comes in contact with the esophagus, the acid causes soreness (inflammation) in the esophagus. Over time, GERD may create small holes (ulcers) in the lining of the esophagus. CAUSES   Increased body weight. This puts pressure on the stomach, making acid rise from the stomach into the esophagus.  Smoking. This increases acid production in the stomach.  Drinking alcohol. This causes decreased pressure in the lower esophageal sphincter (valve or ring of muscle between the esophagus and stomach), allowing acid from the stomach into the esophagus.  Late evening meals and a  full stomach. This increases pressure and acid production in the stomach.  A malformed lower esophageal sphincter. Sometimes, no cause is found. SYMPTOMS   Burning pain in the lower part of the mid-chest behind the breastbone and in the mid-stomach area. This may occur twice a week or more often.  Trouble swallowing.  Sore throat.  Dry cough.  Asthma-like symptoms including chest tightness, shortness of breath, or wheezing. DIAGNOSIS  Your caregiver may be able to diagnose GERD based on your symptoms. In some cases, X-rays and other tests may be done to check for complications or to check the condition of your stomach and esophagus. TREATMENT  Your caregiver may recommend over-the-counter or prescription medicines to help decrease acid production. Ask your caregiver before starting or adding any new medicines.  HOME CARE INSTRUCTIONS   Change the factors that you can control. Ask your caregiver for guidance concerning weight loss, quitting smoking, and alcohol consumption.  Avoid foods and drinks that make your symptoms worse, such as:  Caffeine or alcoholic drinks.  Chocolate.  Peppermint or mint flavorings.  Garlic and onions.  Spicy foods.  Citrus fruits, such as oranges, lemons, or limes.  Tomato-based foods such as sauce, chili, salsa, and pizza.  Fried and fatty foods.  Avoid lying down for the 3 hours prior to your bedtime or prior to taking a nap.  Eat small, frequent meals instead of large meals.  Wear loose-fitting clothing. Do not wear anything tight around your waist that causes pressure on your stomach.  Raise the head of your bed 6 to 8 inches with wood blocks to help you sleep. Extra pillows will not help.  Only take over-the-counter or prescription medicines for pain, discomfort, or fever as directed by your caregiver.  Do not take aspirin, ibuprofen, or other nonsteroidal anti-inflammatory drugs (NSAIDs). SEEK IMMEDIATE MEDICAL CARE IF:   You  have pain in your arms, neck, jaw, teeth, or back.  Your pain increases or changes in intensity or duration.  You develop nausea, vomiting, or sweating (diaphoresis).  You develop shortness of breath, or you faint.  Your vomit is green, yellow, black, or looks like coffee grounds or blood.  Your stool is red, bloody, or black. These symptoms could be signs of other problems, such as heart disease, gastric bleeding, or esophageal bleeding. MAKE SURE YOU:   Understand these instructions.  Will watch your condition.  Will get help right away if you are not doing well or get worse. Document Released: 02/24/2005 Document Revised: 08/09/2011 Document Reviewed: 12/04/2010 [facility] Patient Information 2015 [place], [place]. This information is not intended to replace advice given to you by your health care provider. Make sure you discuss any questions you have with your health care provider.

## 2014-06-12 NOTE — Progress Notes (Signed)
Reviewed. Rather than MBS with speech pathology, I would obtain plain Barium esophagram with barium tablet to r/o stricture. MBS does not  show entire esophagus, just the swallowing mechanism  in patients  suspected to have aspiratation.Marland Kitchen

## 2014-06-13 ENCOUNTER — Telehealth: Payer: Self-pay

## 2014-06-13 ENCOUNTER — Other Ambulatory Visit (HOSPITAL_COMMUNITY): Payer: Self-pay | Admitting: Physician Assistant

## 2014-06-13 DIAGNOSIS — D473 Essential (hemorrhagic) thrombocythemia: Secondary | ICD-10-CM

## 2014-06-13 DIAGNOSIS — D75839 Thrombocytosis, unspecified: Secondary | ICD-10-CM

## 2014-06-13 DIAGNOSIS — R1310 Dysphagia, unspecified: Secondary | ICD-10-CM

## 2014-06-13 NOTE — Progress Notes (Signed)
Tiffany Orozco, this came back to me again--did you get the previous message?

## 2014-06-13 NOTE — Telephone Encounter (Signed)
Shirlean Mylar,  Did you fax over form?  If not, please fax to [phone].

## 2014-06-13 NOTE — Telephone Encounter (Signed)
Pt left v/m; pt was advised by stomach doctor that her platelet count was elevated and to schedule appt with PCP. Platelet results under lab tab. Pt wants to know if needs to schedule appt with Dr Diona Browner; pt is concerned about going off Aggrenox; pt is fearful of having another stroke. Pt request cb.

## 2014-06-13 NOTE — Telephone Encounter (Signed)
In past she has had slightly elevated platelets in reaction to infections/other stressors... Does she have any ongoing infection, has she had and recent prednisone, any recent surgery, blood loss  or trauma? If no, have her return in 1 week for cbc and blood smear.  if still elevated or increased will refer to hematologist for further evaluation.

## 2014-06-13 NOTE — Progress Notes (Signed)
Tiffany Orozco, Can you cancel MBS and schedule for esophagram with baruium pill? (per Dr [name].)

## 2014-06-13 NOTE — Telephone Encounter (Signed)
I don't see form in in box. Can let them know that she can stop her Aggrenox 5 days prior to the procedure.

## 2014-06-14 ENCOUNTER — Ambulatory Visit (HOSPITAL_COMMUNITY)
Admission: RE | Admit: 2014-06-14 | Discharge: 2014-06-14 | Disposition: A | Discharge status: Home / Self Care | Payer: Medicare Other | Source: Ambulatory Visit | Attending: Physician Assistant | Admitting: Physician Assistant

## 2014-06-14 ENCOUNTER — Telehealth: Payer: Self-pay | Admitting: Family Medicine

## 2014-06-14 DIAGNOSIS — Z9049 Acquired absence of other specified parts of digestive tract: Secondary | ICD-10-CM | POA: Insufficient documentation

## 2014-06-14 DIAGNOSIS — R1013 Epigastric pain: Secondary | ICD-10-CM

## 2014-06-14 DIAGNOSIS — R110 Nausea: Secondary | ICD-10-CM

## 2014-06-14 DIAGNOSIS — K760 Fatty (change of) liver, not elsewhere classified: Secondary | ICD-10-CM | POA: Insufficient documentation

## 2014-06-14 DIAGNOSIS — K219 Gastro-esophageal reflux disease without esophagitis: Secondary | ICD-10-CM

## 2014-06-14 NOTE — Telephone Encounter (Signed)
Return patient's call.  See phone note from 06/13/2014.

## 2014-06-14 NOTE — Telephone Encounter (Signed)
Pt returning Donna's call.

## 2014-06-14 NOTE — Telephone Encounter (Signed)
Left message for Ms. Tiffany Orozco to return my call.

## 2014-06-14 NOTE — Telephone Encounter (Signed)
Spoke with Lovena Le.  She states she does not have any ongoing infection, no recent prednisone use, recent surgery, blood loss or trauma.  She states she is just having stomach problems which she is currently seeing the GI for.  She had an ultrasound this morning and is scheduled for endoscopy.  Lab appointment scheduled for 06/20/2014 at 9:30 am to repeat CBC w/diff and blood smear..  Advised if still elevated then Dr. Diona Browner would recommend referral to hematologist for further evaluation.  Patient states understanding.

## 2014-06-17 NOTE — Telephone Encounter (Signed)
INFORMED PT OK TO HOLD AGGRENOX 5 DAYS BEFORE PROCEDURE

## 2014-06-20 ENCOUNTER — Other Ambulatory Visit (INDEPENDENT_AMBULATORY_CARE_PROVIDER_SITE_OTHER): Payer: Medicare Other

## 2014-06-20 DIAGNOSIS — D473 Essential (hemorrhagic) thrombocythemia: Secondary | ICD-10-CM | POA: Diagnosis not present

## 2014-06-20 DIAGNOSIS — D75839 Thrombocytosis, unspecified: Secondary | ICD-10-CM

## 2014-06-20 LAB — CBC WITH DIFFERENTIAL/PLATELET
Basophils Absolute: 0 10*3/uL (ref 0.0–0.1)
Basophils Relative: 0.6 % (ref 0.0–3.0)
Eosinophils Absolute: 0.1 10*3/uL (ref 0.0–0.7)
Eosinophils Relative: 1.8 % (ref 0.0–5.0)
HCT: 41.2 % (ref 36.0–46.0)
Hemoglobin: 13.7 g/dL (ref 12.0–15.0)
Lymphocytes Relative: 31.9 % (ref 12.0–46.0)
Lymphs Abs: 2.3 10*3/uL (ref 0.7–4.0)
MCHC: 33.3 g/dL (ref 30.0–36.0)
MCV: 85.8 fl (ref 78.0–100.0)
Monocytes Absolute: 0.6 10*3/uL (ref 0.1–1.0)
Monocytes Relative: 8.5 % (ref 3.0–12.0)
Neutro Abs: 4 10*3/uL (ref 1.4–7.7)
Neutrophils Relative %: 57.2 % (ref 43.0–77.0)
Platelets: 397 10*3/uL (ref 150.0–400.0)
RBC: 4.8 Mil/uL (ref 3.87–5.11)
RDW: 15.3 % (ref 11.5–15.5)
WBC: 7.1 10*3/uL (ref 4.0–10.5)

## 2014-06-21 ENCOUNTER — Ambulatory Visit (HOSPITAL_COMMUNITY): Payer: Medicaid Other

## 2014-06-21 ENCOUNTER — Other Ambulatory Visit (HOSPITAL_COMMUNITY): Payer: Medicare Other

## 2014-06-21 LAB — PATHOLOGIST SMEAR REVIEW

## 2014-06-24 ENCOUNTER — Telehealth: Payer: Self-pay | Admitting: Family Medicine

## 2014-06-24 NOTE — Telephone Encounter (Signed)
Patient returned your call.

## 2014-06-24 NOTE — Telephone Encounter (Signed)
Returned patient's call in regards to lab work.  Results given-see result note from 06/20/2014.

## 2014-06-25 ENCOUNTER — Telehealth: Payer: Self-pay | Admitting: Physician Assistant

## 2014-06-25 DIAGNOSIS — R1314 Dysphagia, pharyngoesophageal phase: Secondary | ICD-10-CM

## 2014-06-25 NOTE — Telephone Encounter (Signed)
See notes from Dr. [name] and Cecille Rubin Hvozdovic, Baker on 06/12/14.  Needs changed to DG esophagus and cancel MBS Orders entered

## 2014-06-26 ENCOUNTER — Ambulatory Visit (HOSPITAL_COMMUNITY): Admission: RE | Admit: 2014-06-26 | Payer: Medicare Other | Source: Ambulatory Visit

## 2014-06-26 ENCOUNTER — Inpatient Hospital Stay (HOSPITAL_COMMUNITY): Admission: RE | Admit: 2014-06-26 | Payer: Medicare Other | Source: Ambulatory Visit

## 2014-06-28 ENCOUNTER — Ambulatory Visit (HOSPITAL_COMMUNITY): Payer: Medicare Other

## 2014-06-28 ENCOUNTER — Ambulatory Visit (HOSPITAL_COMMUNITY)
Admission: RE | Admit: 2014-06-28 | Discharge: 2014-06-28 | Disposition: A | Discharge status: Home / Self Care | Payer: Medicare Other | Source: Ambulatory Visit | Attending: Physician Assistant | Admitting: Physician Assistant

## 2014-06-28 ENCOUNTER — Other Ambulatory Visit (HOSPITAL_COMMUNITY): Payer: Medicare Other

## 2014-06-28 ENCOUNTER — Other Ambulatory Visit (HOSPITAL_COMMUNITY): Payer: Self-pay | Admitting: Physician Assistant

## 2014-06-28 DIAGNOSIS — K219 Gastro-esophageal reflux disease without esophagitis: Secondary | ICD-10-CM | POA: Diagnosis not present

## 2014-06-28 DIAGNOSIS — R1310 Dysphagia, unspecified: Secondary | ICD-10-CM

## 2014-07-03 ENCOUNTER — Ambulatory Visit (AMBULATORY_SURGERY_CENTER): Payer: Medicare Other | Admitting: Internal Medicine

## 2014-07-03 ENCOUNTER — Encounter: Payer: Self-pay | Admitting: Internal Medicine

## 2014-07-03 VITALS — BP 116/54 | HR 72 | Temp 98.4°F | Resp 21 | Ht 60.0 in | Wt 173.0 lb

## 2014-07-03 DIAGNOSIS — R1310 Dysphagia, unspecified: Secondary | ICD-10-CM | POA: Diagnosis not present

## 2014-07-03 DIAGNOSIS — Z6834 Body mass index (BMI) 34.0-34.9, adult: Secondary | ICD-10-CM | POA: Diagnosis not present

## 2014-07-03 DIAGNOSIS — R112 Nausea with vomiting, unspecified: Secondary | ICD-10-CM | POA: Diagnosis not present

## 2014-07-03 DIAGNOSIS — K2970 Gastritis, unspecified, without bleeding: Secondary | ICD-10-CM

## 2014-07-03 DIAGNOSIS — J449 Chronic obstructive pulmonary disease, unspecified: Secondary | ICD-10-CM | POA: Diagnosis not present

## 2014-07-03 DIAGNOSIS — K2990 Gastroduodenitis, unspecified, without bleeding: Secondary | ICD-10-CM | POA: Diagnosis not present

## 2014-07-03 DIAGNOSIS — E039 Hypothyroidism, unspecified: Secondary | ICD-10-CM | POA: Diagnosis not present

## 2014-07-03 DIAGNOSIS — K219 Gastro-esophageal reflux disease without esophagitis: Secondary | ICD-10-CM | POA: Diagnosis not present

## 2014-07-03 DIAGNOSIS — R110 Nausea: Secondary | ICD-10-CM | POA: Diagnosis not present

## 2014-07-03 DIAGNOSIS — K2100 Gastro-esophageal reflux disease with esophagitis, without bleeding: Secondary | ICD-10-CM

## 2014-07-03 DIAGNOSIS — K319 Disease of stomach and duodenum, unspecified: Secondary | ICD-10-CM | POA: Diagnosis not present

## 2014-07-03 DIAGNOSIS — Z8673 Personal history of transient ischemic attack (TIA), and cerebral infarction without residual deficits: Secondary | ICD-10-CM | POA: Diagnosis not present

## 2014-07-03 MED ORDER — SODIUM CHLORIDE 0.9 % IV SOLN: 500.0000 mL | INTRAVENOUS | Status: DC

## 2014-07-03 MED ORDER — PANTOPRAZOLE SODIUM 40 MG PO TBEC: 40.0000 mg | DELAYED_RELEASE_TABLET | Freq: Two times a day (BID) | ORAL | Status: DC

## 2014-07-03 NOTE — Progress Notes (Signed)
1014 pt. Relived of nausea, sits up on stretcher, taking ice chips without problems.

## 2014-07-03 NOTE — Progress Notes (Signed)
0941 pt. Received 40mg . Zofran Iv per Lajuana Carry CRNA for nausea and small amount vomiting.

## 2014-07-03 NOTE — Op Note (Addendum)
[facility]  [address] [place], [id number]   ENDOSCOPY PROCEDURE REPORT  PATIENT: Tiffany Orozco, Tiffany Orozco  MR#: [id number] BIRTHDATE: [birth year] , 44  yrs. old GENDER: female ENDOSCOPIST: Lafayette Dragon, MD REFERRED BY:  Jinny Sanders, M.D. PROCEDURE DATE:  07/03/2014 PROCEDURE:  EGD w/ biopsy and Savary dilation of esophagus ASA CLASS:     Class III INDICATIONS:  dysphagia, epigastric pain, and nausea.  abnormal barium esophagram did not show esophageal stricture. Patient unable to tolerate barium in supine position because of fall regurgitation MEDICATIONS: Monitored anesthesia care and Propofol 200 mg IV TOPICAL ANESTHETIC: none  DESCRIPTION OF PROCEDURE: After the risks benefits and alternatives of the procedure were thoroughly explained, informed consent was obtained.  The [id number] endoscope was introduced through the mouth and advanced to the second portion of the duodenum , Without limitations.  The instrument was slowly withdrawn as the mucosa was fully examined.    Esophagus:[   the esophageal lumen appeared spastic. There are small amount of f foamy secretions retained in the esophagus. Lower esophageal sphincter was spastic but there was no definite stricture. There was increase in fibrous tissue at the GE junction just if of chronic esophagitis. 2 erosions at the GE junction consistent with acute esophagitis there was a 1-2 cm reducible hiatal hernia.biopsies were taken from GE junction  Stomach: there were scattered erosions throughout the gastric body and gastric antrum. Biopsies were obtained to rule out H. pylori. Gastric outlet was normal. Retroflexion of the endoscope revealed normal fundus and cardia  Duodenum: duodenal bulb and descending duodenum was normal       The scope was then withdrawn from the patient and the procedure completed. savory dilator 16 mm passed over the guidewire there was small amount of fall blood on the  dilator  COMPLICATIONS: There were no immediate complications.  ENDOSCOPIC IMPRESSION: 1. grade 1 esophagitis 2. Esophageal spasm. Passage of 16 mm Savary dilator 3. Retained esophageal secretions suggestive of esophageal dysmotility   RECOMMENDATIONS: 1.  Anti-reflux regimen to be follow 2.  Await pathology results 3. based on barium esophagram patient is having significant gastroesophageal reflux and may be a candidate for Nissen fundoplication, she will follow up with me in the office to see if we can maximize her medical regimen, 4. resume Aggrenox  REPEAT EXAM: for EGD pending biopsy results.  eSigned:  Lafayette Dragon, MD 07/03/2014 9:41 AM Revised: 07/03/2014 9:41 AM   CC:  PATIENT NAME:  Tiffany Orozco, Tiffany Orozco MR#: [id number]

## 2014-07-03 NOTE — Progress Notes (Signed)
Barium Esophagram was completed on 06/28/14.

## 2014-07-03 NOTE — Patient Instructions (Signed)

## 2014-07-03 NOTE — Progress Notes (Signed)
Called to room to assist during endoscopic procedure.  Patient ID and intended procedure confirmed with present staff. Received instructions for my participation in the procedure from the performing physician.  

## 2014-07-03 NOTE — Progress Notes (Signed)
  [facility] Post-op Note  Patient: Tiffany Orozco  Procedure(s) Performed: endoscopy with dilatation  Patient Location: [facility] - Recovery Area  Anesthesia Type: Deep Sedation/Propofol  Level of Consciousness: awake, oriented and patient cooperative  Airway and Oxygen Therapy: Patient Spontanous Breathing  Post-op Pain: none  Post-op Assessment:  Post-op Vital signs reviewed, Patient's Cardiovascular Status Stable, Respiratory Function Stable, Patent Airway, No signs of Nausea or vomiting and Pain level controlled  Post-op Vital Signs: Reviewed and stable  Complications: No apparent anesthesia complications  Moataz Tavis E 9:35 AM

## 2014-07-04 ENCOUNTER — Telehealth: Payer: Self-pay | Admitting: *Deleted

## 2014-07-04 NOTE — Telephone Encounter (Signed)
  Follow up Call-  Call back number 07/03/2014  Post procedure Call Back phone  # [phone]  Permission to leave phone message Yes     Patient questions:  Do you have a fever, pain , or abdominal swelling? No. Pain Score  0 *  Have you tolerated food without any problems? Yes.    Have you been able to return to your normal activities? Yes.    Do you have any questions about your discharge instructions: Diet   No. Medications  No. Follow up visit  No.  Do you have questions or concerns about your Care? No.  Actions: * If pain score is 4 or above: No action needed, pain <4.

## 2014-07-08 ENCOUNTER — Encounter: Payer: Self-pay | Admitting: Internal Medicine

## 2014-07-12 ENCOUNTER — Ambulatory Visit (INDEPENDENT_AMBULATORY_CARE_PROVIDER_SITE_OTHER): Payer: Medicare Other | Admitting: Internal Medicine

## 2014-07-12 ENCOUNTER — Encounter: Payer: Self-pay | Admitting: Internal Medicine

## 2014-07-12 VITALS — BP 126/80 | HR 94 | Temp 98.0°F | Resp 12 | Ht 60.0 in | Wt 174.0 lb

## 2014-07-12 DIAGNOSIS — G8929 Other chronic pain: Secondary | ICD-10-CM | POA: Insufficient documentation

## 2014-07-12 DIAGNOSIS — R350 Frequency of micturition: Secondary | ICD-10-CM

## 2014-07-12 DIAGNOSIS — M549 Dorsalgia, unspecified: Secondary | ICD-10-CM | POA: Diagnosis not present

## 2014-07-12 DIAGNOSIS — R1310 Dysphagia, unspecified: Secondary | ICD-10-CM | POA: Diagnosis not present

## 2014-07-12 LAB — POCT URINALYSIS DIPSTICK
Bilirubin, UA: NEGATIVE
Glucose, UA: NEGATIVE
Ketones, UA: NEGATIVE
Leukocytes, UA: NEGATIVE
Nitrite, UA: NEGATIVE
Protein, UA: NEGATIVE
Spec Grav, UA: <=1.005
Urobilinogen, UA: 0.2
pH, UA: 6

## 2014-07-12 MED ORDER — TRAMADOL HCL 50 MG PO TABS: 50.0000 mg | ORAL_TABLET | Freq: Two times a day (BID) | ORAL | Status: DC | PRN

## 2014-07-12 NOTE — Progress Notes (Signed)
Subjective:    Patient ID: Tiffany Orozco, female    DOB: 1950-06-24, 64 y.o.   MRN: [id number]  HPI  64YO female presents for acute visit for possible UTI.  Notes that chronic back pain has been more persistent lately. This made her question whether she might have a UTI. Of note, has stopped taking BC powder, and back pain has been worse. Recently had upper endoscopy on 2/3 and was diagnosed with esophagitis and esophageal narrowing. Esophageal dilation completed. Notes some persistent mild sore throat after procedure. Has improvement in symptoms of dysphagia. Has occasional nausea and belching which has improved some. Has follow up with Dr. [name] scheduled. No fever, chills, flank pain. Denies urinary frequency, urgency, dysuria. Has not been able to tolerate muscle relaxants because of pain.   Past medical, surgical, family and social history per today's encounter.  Review of Systems  Constitutional: Negative for fever, chills and fatigue.  HENT: Positive for trouble swallowing (chronic).   Gastrointestinal: Negative for nausea, vomiting, abdominal pain, diarrhea, constipation and rectal pain.  Genitourinary: Negative for dysuria, urgency, frequency, hematuria, flank pain, decreased urine volume, vaginal bleeding, vaginal discharge, difficulty urinating, vaginal pain and pelvic pain.  Musculoskeletal: Positive for myalgias, back pain and arthralgias.       Objective:    BP 126/80 mmHg  Pulse 94  Temp(Src) 98 F (36.7 C) (Oral)  Resp 12  Ht 5' (1.524 m)  Wt 174 lb (78.926 kg)  BMI 33.98 kg/m2  SpO2 97% Physical Exam  Constitutional: She is oriented to person, place, and time. She appears well-developed and well-nourished. No distress.  HENT:  Head: Normocephalic and atraumatic.  Right Ear: External ear normal.  Left Ear: External ear normal.  Nose: Nose normal.  Mouth/Throat: Oropharynx is clear and moist. No oropharyngeal exudate.  Eyes: Conjunctivae are  normal. Pupils are equal, round, and reactive to light. Right eye exhibits no discharge. Left eye exhibits no discharge. No scleral icterus.  Neck: Normal range of motion. Neck supple. No tracheal deviation present. No thyromegaly present.  Cardiovascular: Normal rate, regular rhythm, normal heart sounds and intact distal pulses.  Exam reveals no gallop and no friction rub.   No murmur heard. Pulmonary/Chest: Effort normal and breath sounds normal. No respiratory distress. She has no wheezes. She has no rales. She exhibits no tenderness.  Abdominal: There is no tenderness (no CVA tenderness).  Musculoskeletal: Normal range of motion. She exhibits no edema.       Lumbar back: She exhibits tenderness (bilateral lower back) and pain. She exhibits no deformity and no spasm.  Lymphadenopathy:    She has no cervical adenopathy.  Neurological: She is alert and oriented to person, place, and time. No cranial nerve deficit. She exhibits normal muscle tone. Coordination normal.  Skin: Skin is warm and dry. No rash noted. She is not diaphoretic. No erythema. No pallor.  Psychiatric: She has a normal mood and affect. Her behavior is normal. Judgment and thought content normal.          Assessment & Plan:   Problem List Items Addressed This Visit      Unprioritized   Chronic back pain - Primary    Pt notes exacerbation of chronic back pain after recently stopping BC powders. Exam normal except for mild tenderness bilateral lower back. Reviewed recent notes including endoscopy. Discussed risk of NSAIDS with recent diagnosis of esophagitis. Recommended starting prn Flexeril, however she reports she is unable to tolerate muscle relaxants because of drowsiness.  Will start prn Tramadol bid prn. Follow up with PCP next week.      Relevant Medications   traMADol (ULTRAM) tablet 50 mg   Dysphagia    Symptoms improved after recent endoscopy/esophageal dilation. Encouraged follow up with Dr. [name] as  scheduled.       Other Visit Diagnoses    Urinary frequency        Relevant Orders    POCT Urinalysis Dipstick (Completed)    CULTURE, URINE COMPREHENSIVE        Return in about 1 week (around 07/19/2014).

## 2014-07-12 NOTE — Progress Notes (Signed)
Pre visit review using our clinic review tool, if applicable. No additional management support is needed unless otherwise documented below in the visit note. 

## 2014-07-12 NOTE — Patient Instructions (Signed)
Start Tramadol up to twice daily as needed for back pain.  Follow up with Dr. Diona Browner next week.

## 2014-07-12 NOTE — Assessment & Plan Note (Addendum)
Pt notes exacerbation of chronic back pain after recently stopping BC powders. Exam normal except for mild tenderness bilateral lower back. Reviewed recent notes including endoscopy. Discussed risk of NSAIDS with recent diagnosis of esophagitis. Recommended starting prn Flexeril, however she reports she is unable to tolerate muscle relaxants because of drowsiness. Will start prn Tramadol bid prn. Follow up with PCP next week.

## 2014-07-12 NOTE — Assessment & Plan Note (Signed)
Symptoms improved after recent endoscopy/esophageal dilation. Encouraged follow up with Dr. [name] as scheduled.

## 2014-07-14 LAB — CULTURE, URINE COMPREHENSIVE
Colony Count: NO GROWTH
Organism ID, Bacteria: NO GROWTH

## 2014-07-15 ENCOUNTER — Telehealth: Payer: Self-pay | Admitting: Internal Medicine

## 2014-07-15 NOTE — Telephone Encounter (Signed)
Results given to patient as per letter. She is taking Protonix. She is belching more and food stills bothers her stomach. Please, advise.

## 2014-07-15 NOTE — Telephone Encounter (Signed)
EGD was done on 07/03/2014, letter sent 07/08/2014. I told her it would take a week.

## 2014-07-15 NOTE — Telephone Encounter (Signed)
Spoke with patient, GERD is much better  But  Still has some nausea after meals. She will try Gaviscon 1 or 2  After meals prn. She has an appointment with Dr Diona Browner next week and will ask her to check sprue profile.

## 2014-07-15 NOTE — Telephone Encounter (Signed)
Patient is taking Protonix BID. She is belching more and food stills bothers her stomach. Please, advise.

## 2014-07-19 ENCOUNTER — Ambulatory Visit (INDEPENDENT_AMBULATORY_CARE_PROVIDER_SITE_OTHER): Payer: Medicare Other | Admitting: Family Medicine

## 2014-07-19 ENCOUNTER — Encounter: Payer: Self-pay | Admitting: Family Medicine

## 2014-07-19 VITALS — BP 110/62 | HR 85 | Temp 98.7°F | Ht 60.0 in | Wt 173.2 lb

## 2014-07-19 DIAGNOSIS — K5909 Other constipation: Secondary | ICD-10-CM | POA: Diagnosis not present

## 2014-07-19 DIAGNOSIS — K2100 Gastro-esophageal reflux disease with esophagitis, without bleeding: Secondary | ICD-10-CM

## 2014-07-19 DIAGNOSIS — G8929 Other chronic pain: Secondary | ICD-10-CM

## 2014-07-19 DIAGNOSIS — R1084 Generalized abdominal pain: Secondary | ICD-10-CM

## 2014-07-19 DIAGNOSIS — M549 Dorsalgia, unspecified: Secondary | ICD-10-CM | POA: Diagnosis not present

## 2014-07-19 MED ORDER — POLYETHYLENE GLYCOL 3350 17 GM/SCOOP PO POWD: ORAL | Status: DC

## 2014-07-19 NOTE — Progress Notes (Signed)
Pre visit review using our clinic review tool, if applicable. No additional management support is needed unless otherwise documented below in the visit note. 

## 2014-07-19 NOTE — Patient Instructions (Addendum)
Stop at lab on way out. Keep appt with Dr. [name] on 08/06/2014.  Can use tramadol for back pain.  Continue protonix twice daily.  Do not use aspirin or BC powder because it can irritate stomach.

## 2014-07-19 NOTE — Progress Notes (Signed)
Subjective:    Patient ID: Tiffany Orozco, female    DOB: 04-Mar-1951, 63 y.o.   MRN: [id number]  HPI  64 year old female pt presents for follow up  visit  With Dr. Gilford Rile on 2/12 for chronic back pain, dysphagia and urinary frequency  She is under stress, husband has dx of prostate cancer.  UA and Uculture were clear  At that appt: Pt notes exacerbation of chronic back pain after recently stopping BC powders. Exam normal except for mild tenderness bilateral lower back. Reviewed recent notes including endoscopy ( showed esophagitis, esophageal dysmotility, had to dilate esophagus). Discussed risk of NSAIDS with recent diagnosis of esophagitis. Recommended starting prn Flexeril, however she reports she is unable to tolerate muscle relaxants because of drowsiness. Will start prn Tramadol bid prn. Follow up with PCP next week. Recommended follow up with GI Dr. [name]. Has follow up on 08/06/2014   Today she reports that tramadol helps take the edge off he pain. Using twice daily, does not last all day. Makes her feel drowsy. No GI SE. Back pain is  60% better.  Dr. [name] requested PCP to check sprue profile. Has follow up on 08/06/2014  She has abdominal pain when she eats and indigestion. Not taking gaviscon as requested.. She states she has no money. She has been constipated lately.Bonne Dolores his polyethylene glycol of husbands which helped. Requests refill.   .  Review of Systems  Constitutional: Negative for fatigue.  HENT: Negative for ear pain.   Eyes: Negative for pain.  Respiratory: Negative for shortness of breath.   Cardiovascular: Negative for chest pain.  Gastrointestinal: Negative for constipation.       Objective:   Physical Exam  Constitutional: Vital signs are normal. She appears well-developed and well-nourished. She is cooperative.  Non-toxic appearance. She does not appear ill. No distress.  HENT:  Head: Normocephalic.  Right Ear: Hearing, tympanic membrane,  external ear and ear canal normal. Tympanic membrane is not erythematous, not retracted and not bulging.  Left Ear: Hearing, tympanic membrane, external ear and ear canal normal. Tympanic membrane is not erythematous, not retracted and not bulging.  Nose: No mucosal edema or rhinorrhea. Right sinus exhibits no maxillary sinus tenderness and no frontal sinus tenderness. Left sinus exhibits no maxillary sinus tenderness and no frontal sinus tenderness.  Mouth/Throat: Uvula is midline, oropharynx is clear and moist and mucous membranes are normal.  Eyes: Conjunctivae, EOM and lids are normal. Pupils are equal, round, and reactive to light. Lids are everted and swept, no foreign bodies found.  Neck: Trachea normal and normal range of motion. Neck supple. Carotid bruit is not present. No thyroid mass and no thyromegaly present.  Cardiovascular: Normal rate, regular rhythm, S1 normal, S2 normal, normal heart sounds, intact distal pulses and normal pulses.  Exam reveals no gallop and no friction rub.   No murmur heard. Pulmonary/Chest: Effort normal and breath sounds normal. No tachypnea. No respiratory distress. She has no decreased breath sounds. She has no wheezes. She has no rhonchi. She has no rales.  Abdominal: Soft. Normal appearance and bowel sounds are normal. There is tenderness in the epigastric area.  Musculoskeletal:       Lumbar back: She exhibits decreased range of motion, tenderness, bony tenderness and pain. She exhibits no spasm.  Neurological: She is alert. She has normal strength. No sensory deficit. Coordination and gait normal.  Skin: Skin is warm, dry and intact. No rash noted.  Psychiatric: Her speech is  normal and behavior is normal. Judgment and thought content normal. Her mood appears not anxious. Cognition and memory are normal. She does not exhibit a depressed mood.          Assessment & Plan:

## 2014-07-20 DIAGNOSIS — R109 Unspecified abdominal pain: Secondary | ICD-10-CM | POA: Insufficient documentation

## 2014-07-20 NOTE — Assessment & Plan Note (Signed)
Increase fiber, water, exercise and rx for poly ethylene glycol given.

## 2014-07-20 NOTE — Assessment & Plan Note (Signed)
Reviewed GI note. Will send for sprue panel as requested by GI.

## 2014-07-20 NOTE — Assessment & Plan Note (Signed)
Improved. Tolerable with tramadol.

## 2014-07-20 NOTE — Assessment & Plan Note (Signed)
Continue protonix twice daily.

## 2014-07-22 LAB — GLIA (IGA/G) + TTG IGA
Gliadin IgA: 4 Units (ref <20)
Gliadin IgG: 2 Units (ref <20)
Tissue Transglutaminase Ab, IgA: 1 U/mL (ref <4)

## 2014-07-23 ENCOUNTER — Encounter: Payer: Self-pay | Admitting: *Deleted

## 2014-08-06 ENCOUNTER — Ambulatory Visit (INDEPENDENT_AMBULATORY_CARE_PROVIDER_SITE_OTHER): Payer: Medicare Other | Admitting: Internal Medicine

## 2014-08-06 ENCOUNTER — Encounter: Payer: Self-pay | Admitting: Internal Medicine

## 2014-08-06 VITALS — BP 132/74 | HR 90 | Ht 60.0 in | Wt 174.8 lb

## 2014-08-06 DIAGNOSIS — K2970 Gastritis, unspecified, without bleeding: Secondary | ICD-10-CM

## 2014-08-06 DIAGNOSIS — K2990 Gastroduodenitis, unspecified, without bleeding: Secondary | ICD-10-CM | POA: Diagnosis not present

## 2014-08-06 DIAGNOSIS — K219 Gastro-esophageal reflux disease without esophagitis: Secondary | ICD-10-CM

## 2014-08-06 NOTE — Progress Notes (Signed)
Tiffany Orozco November 04, 1950 [id number]  Note: This dictation was prepared with Dragon digital system. Any transcriptional errors that result from this procedure are unintentional.   History of Present Illness: This is a 64 year old white female with severe gastroesophageal reflux. She was unable to complete a barium esophagram recently, being unable to lay down in supine position and swallowed barium area she has been sleeping in a recliner now for period of time. She is it has increased Protonix to 40 mg twice a day and she is markedly improved. He has not had any episode of regurgitation or choking. Patient has been under a lot of stress because her husband has been in the hospital several times most recently with prostate cancer. Upper endoscopy in February 2016 showed grade 1 esophagitis. Mild esophageal stricture which was dilated with 16 mm dilator. There was no Barrett's esophagus. She has been much improved in terms of swallowing. Patient has a history of rectocele and constipation for which she takes MiraLAX. Surgery for rectocele has been recommended to her but she has not the cause of her husband illness    Past Medical History  Diagnosis Date  . Anxiety   . Depression   . GERD (gastroesophageal reflux disease)   . Hypothyroidism   . Skin cancer of face   . Skin cancer of arm   . Allergy   . COPD (chronic obstructive pulmonary disease)   . Hyperlipidemia   . Hypertension   . Obesity   . DDD (degenerative disc disease), lumbar     mild  . Stroke 07/2010  . Osteopenia   . Heart murmur   . Thrombocytopenia   . Female rectocele without uterine prolapse     Past Surgical History  Procedure Laterality Date  . Tubal ligation  1975  . Appendectomy    . Foot surgery  03/31/2009    R foot  . Emgs  7/04    atms and wrists neg  . Cholecystectomy  2004  . Foot surgery  2008, 2009, 2010    right foot     Allergies  Allergen Reactions  . Lisinopril     Shortness of breath.   . Niacin     REACTION: dizziness  . Welchol [Colesevelam Hcl] Other (See Comments)    Diarrhea,Nausea, swelling in face  . Augmentin [Amoxicillin-Pot Clavulanate]     Nauseated. GI upset with taking.  . Codeine     keeps patient awake  . Oxycodone Hcl     Patient states this medication makes her hyper    Family history and social history have been reviewed.  Review of Systems: Positive for constipation. Positive for gastroesophageal reflux  The remainder of the 10 point ROS is negative except as outlined in the H&P  Physical Exam: General Appearance Well developed, in no distress Eyes  Non icteric  HEENT  Non traumatic, normocephalic  Mouth No lesion, tongue papillated, no cheilosis Neck Supple without adenopathy, thyroid not enlarged, no carotid bruits, no JVD Lungs Clear to auscultation bilaterally COR Normal S1, normal S2, regular rhythm, no murmur, quiet precordium Abdomen soft, nontender, normoactive bowel sounds. Liver edge at costal margin Rectal not repeated Extremities  No pedal edema Skin No lesions Neurological Alert and oriented x 3 Psychological Normal mood and affect  Assessment and Plan:   64 year old white female who had severe gastroesophageal reflux but she is not improved on pantoprazole 40 mg twice a day and strict antireflux measures. She will also continue to use antacids every  2-4 hours especially at night when necessary. We will wait with the surgery. She would need a repeat attempt for barium esophagram before deciding about all [employer] fundoplication The ceiling constipation. She will continue MiraLAX as needed     Delfin Edis 08/06/2014

## 2014-08-06 NOTE — Patient Instructions (Addendum)
Use Miralax for constipation Continue Protonix twice a day Dr Diona Browner Gastroesophageal Reflux Disease, Adult Gastroesophageal reflux disease (GERD) happens when acid from your stomach flows up into the esophagus. When acid comes in contact with the esophagus, the acid causes soreness (inflammation) in the esophagus. Over time, GERD may create small holes (ulcers) in the lining of the esophagus. CAUSES   Increased body weight. This puts pressure on the stomach, making acid rise from the stomach into the esophagus.  Smoking. This increases acid production in the stomach.  Drinking alcohol. This causes decreased pressure in the lower esophageal sphincter (valve or ring of muscle between the esophagus and stomach), allowing acid from the stomach into the esophagus.  Late evening meals and a full stomach. This increases pressure and acid production in the stomach.  A malformed lower esophageal sphincter. Sometimes, no cause is found. SYMPTOMS   Burning pain in the lower part of the mid-chest behind the breastbone and in the mid-stomach area. This may occur twice a week or more often.  Trouble swallowing.  Sore throat.  Dry cough.  Asthma-like symptoms including chest tightness, shortness of breath, or wheezing. DIAGNOSIS  Your caregiver may be able to diagnose GERD based on your symptoms. In some cases, X-rays and other tests may be done to check for complications or to check the condition of your stomach and esophagus. TREATMENT  Your caregiver may recommend over-the-counter or prescription medicines to help decrease acid production. Ask your caregiver before starting or adding any new medicines.  HOME CARE INSTRUCTIONS   Change the factors that you can control. Ask your caregiver for guidance concerning weight loss, quitting smoking, and alcohol consumption.  Avoid foods and drinks that make your symptoms worse, such as:  Caffeine or alcoholic drinks.  Chocolate.  Peppermint or  mint flavorings.  Garlic and onions.  Spicy foods.  Citrus fruits, such as oranges, lemons, or limes.  Tomato-based foods such as sauce, chili, salsa, and pizza.  Fried and fatty foods.  Avoid lying down for the 3 hours prior to your bedtime or prior to taking a nap.  Eat small, frequent meals instead of large meals.  Wear loose-fitting clothing. Do not wear anything tight around your waist that causes pressure on your stomach.  Raise the head of your bed 6 to 8 inches with wood blocks to help you sleep. Extra pillows will not help.  Only take over-the-counter or prescription medicines for pain, discomfort, or fever as directed by your caregiver.  Do not take aspirin, ibuprofen, or other nonsteroidal anti-inflammatory drugs (NSAIDs). SEEK IMMEDIATE MEDICAL CARE IF:   You have pain in your arms, neck, jaw, teeth, or back.  Your pain increases or changes in intensity or duration.  You develop nausea, vomiting, or sweating (diaphoresis).  You develop shortness of breath, or you faint.  Your vomit is green, yellow, black, or looks like coffee grounds or blood.  Your stool is red, bloody, or black. These symptoms could be signs of other problems, such as heart disease, gastric bleeding, or esophageal bleeding. MAKE SURE YOU:   Understand these instructions.  Will watch your condition.  Will get help right away if you are not doing well or get worse. Document Released: 02/24/2005 Document Revised: 08/09/2011 Document Reviewed: 12/04/2010 [facility] Patient Information 2015 [place], [place]. This information is not intended to replace advice given to you by your health care provider. Make sure you discuss any questions you have with your health care provider.   Food Choices for  Gastroesophageal Reflux Disease When you have gastroesophageal reflux disease (GERD), the foods you eat and your eating habits are very important. Choosing the right foods can help ease the discomfort of  GERD. WHAT GENERAL GUIDELINES DO I NEED TO FOLLOW?  Choose fruits, vegetables, whole grains, low-fat dairy products, and low-fat meat, fish, and poultry.  Limit fats such as oils, salad dressings, butter, nuts, and avocado.  Keep a food diary to identify foods that cause symptoms.  Avoid foods that cause reflux. These may be different for different people.  Eat frequent small meals instead of three large meals each day.  Eat your meals slowly, in a relaxed setting.  Limit fried foods.  Cook foods using methods other than frying.  Avoid drinking alcohol.  Avoid drinking large amounts of liquids with your meals.  Avoid bending over or lying down until 2-3 hours after eating. WHAT FOODS ARE NOT RECOMMENDED? The following are some foods and drinks that may worsen your symptoms: Vegetables Tomatoes. Tomato juice. Tomato and spaghetti sauce. Chili peppers. Onion and garlic. Horseradish. Fruits Oranges, grapefruit, and lemon (fruit and juice). Meats High-fat meats, fish, and poultry. This includes hot dogs, ribs, ham, sausage, salami, and bacon. Dairy Whole milk and chocolate milk. Sour cream. Cream. Butter. Ice cream. Cream cheese.  Beverages Coffee and tea, with or without caffeine. Carbonated beverages or energy drinks. Condiments Hot sauce. Barbecue sauce.  Sweets/Desserts Chocolate and cocoa. Donuts. Peppermint and spearmint. Fats and Oils High-fat foods, including [place] fries and potato chips. Other Vinegar. Strong spices, such as black pepper, white pepper, red pepper, cayenne, curry powder, cloves, ginger, and chili powder. The items listed above may not be a complete list of foods and beverages to avoid. Contact your dietitian for more information. Document Released: 05/17/2005 Document Revised: 05/22/2013 Document Reviewed: 03/21/2013 [facility] Patient Information 2015 [place], [place]. This information is not intended to replace advice given to you by your health care  provider. Make sure you discuss any questions you have with your health care provider.

## 2014-08-16 ENCOUNTER — Telehealth: Payer: Self-pay | Admitting: Family Medicine

## 2014-08-16 NOTE — Telephone Encounter (Signed)
In outbox

## 2014-08-16 NOTE — Telephone Encounter (Signed)
Patient called about the clearance for a tooth extraction.  Patient said there's infection in her gums and they want to remove it today or tomorrow.  Patient said she was put to sleep a month ago by Dr.Brodie and she was fine.  Please call patient when form is faxed to Dentist.

## 2014-08-16 NOTE — Telephone Encounter (Signed)
Form faxed to dentist office

## 2014-08-16 NOTE — Telephone Encounter (Signed)
Form is on your desk.

## 2014-09-04 ENCOUNTER — Telehealth: Payer: Self-pay | Admitting: *Deleted

## 2014-09-04 NOTE — Telephone Encounter (Signed)
Received letter from [facility] that states ASA/DIPYRIDA CAP 25-200 MG is not on formulary.  Called patient to get [name] to start prior authorization and Tiffany Orozco informed me that I did not need to do a PA, that her insurance will cover Brand Name Aggrenox and it will only cost her a $3 co-pay.  Pharmacy will change to Brand name Aggrenox next month when she gets a refill since her insurance supplied her with a 30 day supply of the generic for this month.  Updated medication list so next time we do need to send in refills that it will go in as Brand Name.

## 2014-09-19 ENCOUNTER — Other Ambulatory Visit: Payer: Self-pay | Admitting: Family Medicine

## 2014-10-10 ENCOUNTER — Telehealth: Payer: Self-pay | Admitting: Family Medicine

## 2014-10-10 ENCOUNTER — Other Ambulatory Visit (INDEPENDENT_AMBULATORY_CARE_PROVIDER_SITE_OTHER): Payer: Medicare Other

## 2014-10-10 DIAGNOSIS — M8580 Other specified disorders of bone density and structure, unspecified site: Secondary | ICD-10-CM

## 2014-10-10 DIAGNOSIS — E038 Other specified hypothyroidism: Secondary | ICD-10-CM | POA: Diagnosis not present

## 2014-10-10 DIAGNOSIS — E7800 Pure hypercholesterolemia, unspecified: Secondary | ICD-10-CM

## 2014-10-10 DIAGNOSIS — M859 Disorder of bone density and structure, unspecified: Secondary | ICD-10-CM | POA: Diagnosis not present

## 2014-10-10 LAB — LIPID PANEL
Cholesterol: 196 mg/dL (ref 0–200)
HDL: 37.5 mg/dL — ABNORMAL LOW (ref >39.00)
LDL Cholesterol: 130 mg/dL — ABNORMAL HIGH (ref 0–99)
NonHDL: 158.5
Total CHOL/HDL Ratio: 5
Triglycerides: 142 mg/dL (ref 0.0–149.0)
VLDL: 28.4 mg/dL (ref 0.0–40.0)

## 2014-10-10 LAB — COMPREHENSIVE METABOLIC PANEL
ALT: 17 U/L (ref 0–35)
AST: 16 U/L (ref 0–37)
Albumin: 3.9 g/dL (ref 3.5–5.2)
Alkaline Phosphatase: 101 U/L (ref 39–117)
BUN: 8 mg/dL (ref 6–23)
CO2: 28 mEq/L (ref 19–32)
Calcium: 9.4 mg/dL (ref 8.4–10.5)
Chloride: 94 mEq/L — ABNORMAL LOW (ref 96–112)
Creatinine, Ser: 0.65 mg/dL (ref 0.40–1.20)
GFR: 96.93 mL/min (ref >60.00)
Glucose, Bld: 86 mg/dL (ref 70–99)
Potassium: 3.8 mEq/L (ref 3.5–5.1)
Sodium: 130 mEq/L — ABNORMAL LOW (ref 135–145)
Total Bilirubin: 0.4 mg/dL (ref 0.2–1.2)
Total Protein: 7.3 g/dL (ref 6.0–8.3)

## 2014-10-10 LAB — TSH: TSH: 1.03 u[IU]/mL (ref 0.35–4.50)

## 2014-10-10 LAB — VITAMIN D 25 HYDROXY (VIT D DEFICIENCY, FRACTURES): VITD: 25.55 ng/mL — ABNORMAL LOW (ref 30.00–100.00)

## 2014-10-10 NOTE — Telephone Encounter (Signed)
-----   Message from Ellamae Sia sent at 10/04/2014 10:10 AM EDT ----- Regarding: Lab orders for Thursday, 5.12.16 Patient is scheduled for CPX labs, please order future labs, Thanks , Karna Christmas

## 2014-10-15 ENCOUNTER — Ambulatory Visit (INDEPENDENT_AMBULATORY_CARE_PROVIDER_SITE_OTHER): Payer: Medicare Other | Admitting: Family Medicine

## 2014-10-15 ENCOUNTER — Encounter: Payer: Self-pay | Admitting: Family Medicine

## 2014-10-15 VITALS — BP 133/77 | HR 83 | Temp 98.6°F | Ht 59.5 in | Wt 171.0 lb

## 2014-10-15 DIAGNOSIS — I158 Other secondary hypertension: Secondary | ICD-10-CM

## 2014-10-15 DIAGNOSIS — Z0000 Encounter for general adult medical examination without abnormal findings: Secondary | ICD-10-CM

## 2014-10-15 DIAGNOSIS — Z23 Encounter for immunization: Secondary | ICD-10-CM

## 2014-10-15 DIAGNOSIS — E7800 Pure hypercholesterolemia, unspecified: Secondary | ICD-10-CM

## 2014-10-15 DIAGNOSIS — E871 Hypo-osmolality and hyponatremia: Secondary | ICD-10-CM | POA: Diagnosis not present

## 2014-10-15 DIAGNOSIS — E038 Other specified hypothyroidism: Secondary | ICD-10-CM | POA: Diagnosis not present

## 2014-10-15 DIAGNOSIS — F17210 Nicotine dependence, cigarettes, uncomplicated: Secondary | ICD-10-CM

## 2014-10-15 NOTE — Assessment & Plan Note (Signed)
Poor control off pravastatin. Restart and will likely need to increase dose. Follow up with labs in 3 months.

## 2014-10-15 NOTE — Progress Notes (Signed)
Pre visit review using our clinic review tool, if applicable. No additional management support is needed unless otherwise documented below in the visit note. 

## 2014-10-15 NOTE — Assessment & Plan Note (Signed)
Well controlled. Continue current medication.  

## 2014-10-15 NOTE — Patient Instructions (Addendum)
Change HCTZ to as needed. Start vit D3 400 IU twice daily. Restart simvastatin 20 mg daily. Lung cancer screeing program will call to set up lung cancer screen for you when you are ready. Work on exercise, weight loss, healthy eating habits.

## 2014-10-15 NOTE — Assessment & Plan Note (Signed)
Has had chronic issues with hyponatremia per pt. Worsened at least in part due to initiation of HCTZ... Change to using prn.

## 2014-10-15 NOTE — Progress Notes (Signed)
I have personally reviewed the Medicare Annual Wellness questionnaire and have noted 1.The patient's medical and social history 2.Their use of alcohol, tobacco or illicit drugs 3.Their current medications and supplements 4.The patient's functional ability including ADL's, fall risks, home safety risks and hearing or visual  impairment. 5.Diet and physical activities 6.Evidence for depression or mood disorders The patients weight, height, BMI and visual acuity have been recorded in the chart I have made referrals, counseling and provided education to the patient based review of the above and I have provided the pt with a written personalized care plan for preventive services.  Gastritis, GERD, dysphagia: Dr. [name]: Upper endoscopy in February 2016 showed grade 1 esophagitis. Mild esophageal stricture which was dilated with 16 mm dilator. There was no Barrett's esophagus.   Had 2 abscessed teeth... Treated by dentist. Now nausea and swelling face improved.  Completed antibiotics.   She is stressed out with   Hyponatremia: She feels very tired. 1 month ago she decrease HCTZ to every other day given some dizziness and lightheadedness.   Depression, moderate control.. On effexor 75 mg daily  Hypertension:  Well controlled on amlodipine and HCTZ. BP Readings from Last 3 Encounters:  10/15/14 133/77  08/06/14 132/74  07/19/14 110/62              Hypothyroid:  Well controlled on armour thyroid Lab Results  Component Value Date   TSH 1.03 10/10/2014    Elevated Cholesterol: HDL low, but LDL NO LONGER AT   goal <70 Off of simvastatin. HX of CVA on aggrenox. Lab Results  Component Value Date   CHOL 196 10/10/2014   HDL 37.50* 10/10/2014   LDLCALC 130* 10/10/2014   LDLDIRECT 187.9 06/22/2010   TRIG 142.0 10/10/2014   CHOLHDL 5 10/10/2014    Lab Results  Component Value Date   CHOL 143 12/29/2012    HDL 32.00* 8/1.lasttsh /2014   LDLCALC 90 12/29/2012   LDLDIRECT 187.9 06/22/2010   TRIG 107.0 12/29/2012   CHOLHDL 4 12/29/2012  Using medications without problems:None  Muscle aches: None  Diet compliance: poor appetite, has to avoid a lot things due to stomach irritation ( greasy, dairy, sweet things) Exercise: None due to hip pain   Wt Readings from Last 3 Encounters:  10/15/14 171 lb (77.565 kg)  08/06/14 174 lb 12.8 oz (79.289 kg)  07/19/14 173 lb 4 oz (78.586 kg)   Vit D low:   Review of Systems  Constitutional: Positive for fatigue. Negative for fever and unexpected weight change.  HENT: Negative for congestion, ear pain, sinus pressure, sneezing, sore throat and trouble swallowing.  Eyes: Negative for pain and itching.  Respiratory: Negative for cough, shortness of breath and wheezing.  Cardiovascular: Negative for chest pain, palpitations and leg swelling.  Gastrointestinal: Negative for nausea, abdominal pain, diarrhea, constipation and blood in stool.  Genitourinary: Negative for dysuria, hematuria, vaginal discharge, difficulty urinating and menstrual problem.  Skin: Negative for rash.  Neurological: Negative for syncope, weakness, light-headedness, numbness and headaches.  Psychiatric/Behavioral: Negative for confusion and dysphoric mood. The patient is not nervous/anxious.       Objective:   Physical Exam  Constitutional: Vital signs are normal. She appears well-developed and well-nourished. She is cooperative. Non-toxic appearance. She does not appear ill. No distress.  HENT:  Head: Normocephalic.  Right Ear: Hearing, tympanic membrane, external ear and ear canal normal.  Left Ear: Hearing, tympanic membrane, external ear and ear canal normal.  Nose: Nose normal.  Eyes: Conjunctivae, EOM  and lids are normal. Pupils are equal, round, and reactive to light. Lids are everted and swept, no foreign bodies found.  Neck: Trachea normal and  normal range of motion. Neck supple. Carotid bruit is not present. No mass and no thyromegaly present.  Cardiovascular: Normal rate, regular rhythm, S1 normal, S2 normal, normal heart sounds and intact distal pulses. Exam reveals no gallop.  No murmur heard. Pulmonary/Chest: Effort normal and breath sounds normal. No respiratory distress. She has no wheezes. She has no rhonchi. She has no rales.  Abdominal: Soft. Normal appearance and bowel sounds are normal. She exhibits no distension, no fluid wave, no abdominal bruit and no mass. There is no hepatosplenomegaly. There is no tenderness. There is no rebound, no guarding and no CVA tenderness. No hernia.  Genitourinary: Vagina normal. No breast swelling, tenderness, discharge or bleeding. Pelvic exam was performed with patient supine. There is no rash, tenderness or lesion on the right labia. There is no rash, tenderness or lesion on the left labia. Uterus is not enlarged and not tender. Right adnexum displays no mass, no tenderness and no fullness. Left adnexum displays no mass, no tenderness and no fullness.  Uterine prolapse, chronic per pt, asymptomatic.  Lymphadenopathy:   She has no cervical adenopathy.   She has no axillary adenopathy.  Neurological: She is alert. She has normal strength. No cranial nerve deficit or sensory deficit.  Skin: Skin is warm, dry and intact. No rash noted.  Psychiatric: Her speech is normal and behavior is normal. Judgment normal. Her mood appears not anxious. Cognition and memory are normal. She does not exhibit a depressed mood.          Assessment & Plan:  The patient's preventative maintenance and recommended screening tests for an annual wellness exam were reviewed in full today. Brought up to date unless services declined.  Counselled on the importance of diet, exercise, and its role in overall health and mortality. The patient's FH and SH was reviewed, including their home life, tobacco status,  and drug and alcohol status.   Vaccines: Uptodate with flu, Td, shingles, due for prevnar PAP/DVE:nml in 2013, every 3 years.Marland KitchenDVE due in NOW. Mammo: nml 04/2014 DXA: osteopenia 06/2011.. no significant change.  Colon: 04/2010, Dr. [name], nml, repeat in 10 years  Smoker:precontemplative 31 pack year history, refer to lung cancer screening program.  nml EKG in 07/2013.

## 2014-10-16 ENCOUNTER — Telehealth: Payer: Self-pay | Admitting: Acute Care

## 2014-10-16 ENCOUNTER — Other Ambulatory Visit: Payer: Self-pay | Admitting: Acute Care

## 2014-10-16 DIAGNOSIS — Z87891 Personal history of nicotine dependence: Secondary | ICD-10-CM

## 2014-10-16 NOTE — Telephone Encounter (Signed)
I spoke with Tiffany Orozco about the Lung Cancer Screening Program. We have her scheduled for the [facility] on 10/23/14 at 2 pm, with the LDCT scheduled for 10/23/14 at 3 pm at the [facility]She verbalized understanding of the time and location of both appointments. I also spoke with her husband to confirm the scan time and location. He also verbalized understanding.

## 2014-10-23 ENCOUNTER — Ambulatory Visit (INDEPENDENT_AMBULATORY_CARE_PROVIDER_SITE_OTHER)
Admission: RE | Admit: 2014-10-23 | Discharge: 2014-10-23 | Disposition: A | Discharge status: Home / Self Care | Payer: Medicare Other | Source: Ambulatory Visit | Attending: Acute Care | Admitting: Acute Care

## 2014-10-23 ENCOUNTER — Encounter: Payer: Self-pay | Admitting: Acute Care

## 2014-10-23 ENCOUNTER — Ambulatory Visit (INDEPENDENT_AMBULATORY_CARE_PROVIDER_SITE_OTHER): Payer: Medicare Other | Admitting: Acute Care

## 2014-10-23 DIAGNOSIS — Z122 Encounter for screening for malignant neoplasm of respiratory organs: Secondary | ICD-10-CM | POA: Diagnosis not present

## 2014-10-23 DIAGNOSIS — Z87891 Personal history of nicotine dependence: Secondary | ICD-10-CM

## 2014-10-23 DIAGNOSIS — F17219 Nicotine dependence, cigarettes, with unspecified nicotine-induced disorders: Secondary | ICD-10-CM | POA: Diagnosis not present

## 2014-10-23 DIAGNOSIS — I2510 Atherosclerotic heart disease of native coronary artery without angina pectoris: Secondary | ICD-10-CM | POA: Diagnosis not present

## 2014-10-23 DIAGNOSIS — F17200 Nicotine dependence, unspecified, uncomplicated: Secondary | ICD-10-CM | POA: Diagnosis not present

## 2014-10-23 NOTE — Progress Notes (Signed)
Shared Decision Making Visit Lung Cancer Screening Program [id number])   Eligibility:  Age 64 y.o.  Pack Years Smoking History Calculation 31 (# packs/per year x # years smoked)  Recent History of coughing up blood  no  Unexplained weight loss? no ( >Than 15 pounds within the last 6 months )  Prior History Lung / other cancer no (Diagnosis within the last 5 years already requiring surveillance chest CT Scans).  Smoking Status Current Smoker  Former Smokers: Years since quit: N/A  Quit Date: N/A  Visit Components:  Discussion included one or more decision making aids. yes  Discussion included risk/benefits of screening. yes  Discussion included potential follow up diagnostic testing for abnormal scans. yes  Discussion included meaning and risk of over diagnosis. yes  Discussion included meaning and risk of False Positives. yes  Discussion included meaning of total radiation exposure. yes  Counseling Included:  Importance of adherence to annual lung cancer LDCT screening. yes  Impact of comorbidities on ability to participate in the program. yes  Ability and willingness to under diagnostic treatment. yes  Smoking Cessation Counseling:  Current Smokers:   Discussed importance of smoking cessation. yes  Information about tobacco cessation classes and interventions provided to patient. yes  Patient provided with "ticket" for LDCT Scan. yes  Symptomatic Patient. no  Counseling: N/A  Diagnosis Code: Tobacco Use Z72.0  Asymptomatic Patient yes  Counseling (Intermediate counseling: > three minutes counseling) N0037  Former Smokers:   Discussed the importance of maintaining cigarette abstinence. N/A  Diagnosis Code: Personal History of Nicotine Dependence. C48.889  Information about tobacco cessation classes and interventions provided to patient. Yes  Patient provided with "ticket" for LDCT Scan. yes  Written Order for Lung Cancer Screening with LDCT placed  in Epic.N/A (CT Chest Lung Cancer Screening Low Dose W/O CM) [id number] Z12.2-Screening of respiratory organs Z87.891-Personal history of nicotine dependence  I spent 20 minutes of face to face time discussing the risks and benefits of the lung cancer screening program with Mrs. Lovena Le.We watched the power point , stopping at intervals to discuss elements as necessary.Mrs. Burkel verbalized understanding of the risks and benefits of the program. We discussed that the single most important action she can take to decrease her risk of developing lung cancer is to quit smoking.She verbalized understanding. Her husband is going through radiation treatment for prostate cancer, and she feels she is under too much stress to quit at this time.I told her to call me when she is ready, and I will help her to achieve that goal through nicotine replacement therapy, non- nicotine medications and other support groups and resources.She verbalized understanding. I provided her with a copy of the power point we reviewed together to read and refer to as needed.I also gave her a " Be stronger than your excuses" card.She has a ticket to ride the scanner and is on the way over to the [address] scanner. She knows where the scanner is located and verbalized understanding of the 3:00 appointment. Mrs. Ferraiolo has a good understanding of the risks and benefits of the Lung Cancer Screening Program as it pertains to her individually.  Magdalen Spatz, NP

## 2014-10-24 ENCOUNTER — Telehealth: Payer: Self-pay | Admitting: Acute Care

## 2014-10-24 NOTE — Telephone Encounter (Signed)
I called to give Tiffany Orozco her Lung Cancer Screening Scan results. I explained that her scan was scored as a Lung RADS 2, which is Nodules with a very low likelihood of becoming a clinically active cancer due to size or lack of growth.She verbalized understanding. I told her we would call her for  her annual scan to be scheduled in May of 2017. She has my contact information if she has any further questions.

## 2014-11-16 ENCOUNTER — Other Ambulatory Visit: Payer: Self-pay | Admitting: Family Medicine

## 2014-12-04 ENCOUNTER — Telehealth: Payer: Self-pay | Admitting: Family Medicine

## 2014-12-04 DIAGNOSIS — R21 Rash and other nonspecific skin eruption: Secondary | ICD-10-CM

## 2014-12-04 NOTE — Telephone Encounter (Signed)
Pt wants a referral to see Rolm Bookbinder or Dr Pablo Ledger. She would prefer to see [facility] [phone]) [facility] or [facility]   for an issue pt is having.  Please call pt back at [phone].

## 2014-12-06 ENCOUNTER — Other Ambulatory Visit: Payer: Self-pay | Admitting: Family Medicine

## 2014-12-06 NOTE — Telephone Encounter (Signed)
To make referral I need the reason she wants to be seen.

## 2014-12-06 NOTE — Telephone Encounter (Signed)
Referral sent 

## 2014-12-06 NOTE — Telephone Encounter (Signed)
Called Mrs. Tiffany Orozco and asked for clarification on what issue she was having in order to make referral to dermatologist. Pt stated that she had spots removed on face and chest years ago and that they have appeared to come back. Pt requests call back at [phone].

## 2014-12-13 ENCOUNTER — Telehealth: Payer: Self-pay | Admitting: *Deleted

## 2014-12-13 NOTE — Telephone Encounter (Signed)
Oncology Nurse Navigator Documentation  Oncology Nurse Navigator Flowsheets 12/13/2014  Navigator Encounter Type Telephone/I called patient to follow up on his smoking cessation.  I left my name and phone number to call if needed.   Barriers/Navigation Needs Education

## 2014-12-23 ENCOUNTER — Other Ambulatory Visit: Payer: Self-pay | Admitting: Internal Medicine

## 2014-12-26 ENCOUNTER — Other Ambulatory Visit: Payer: Self-pay | Admitting: Family Medicine

## 2015-01-02 DIAGNOSIS — L820 Inflamed seborrheic keratosis: Secondary | ICD-10-CM | POA: Diagnosis not present

## 2015-01-02 DIAGNOSIS — L821 Other seborrheic keratosis: Secondary | ICD-10-CM | POA: Diagnosis not present

## 2015-01-02 DIAGNOSIS — L570 Actinic keratosis: Secondary | ICD-10-CM | POA: Diagnosis not present

## 2015-01-14 ENCOUNTER — Telehealth: Payer: Self-pay | Admitting: Family Medicine

## 2015-01-14 DIAGNOSIS — D473 Essential (hemorrhagic) thrombocythemia: Secondary | ICD-10-CM

## 2015-01-14 DIAGNOSIS — D75839 Thrombocytosis, unspecified: Secondary | ICD-10-CM

## 2015-01-14 DIAGNOSIS — E7800 Pure hypercholesterolemia, unspecified: Secondary | ICD-10-CM

## 2015-01-14 NOTE — Telephone Encounter (Signed)
-----   Message from Ellamae Sia sent at 01/09/2015 11:29 AM EDT ----- Regarding: Lab orders for Wednesday, 8.17.16 Lab orders for a 3 month follow up appt.  Also, you have a test ordered, "save smear" that should be "Pathology review". I can change that, just let me know.  Do you want that done at this lab appt? Thanks, Karna Christmas

## 2015-01-14 NOTE — Telephone Encounter (Signed)
Platelets nml at last check. Only need smear if platelets on re-check abnormal.   Cancel "save smear".

## 2015-01-15 ENCOUNTER — Other Ambulatory Visit (INDEPENDENT_AMBULATORY_CARE_PROVIDER_SITE_OTHER): Payer: Medicare Other

## 2015-01-15 DIAGNOSIS — D473 Essential (hemorrhagic) thrombocythemia: Secondary | ICD-10-CM | POA: Diagnosis not present

## 2015-01-15 DIAGNOSIS — E7800 Pure hypercholesterolemia, unspecified: Secondary | ICD-10-CM

## 2015-01-15 DIAGNOSIS — D75839 Thrombocytosis, unspecified: Secondary | ICD-10-CM

## 2015-01-15 LAB — COMPREHENSIVE METABOLIC PANEL
ALT: 17 U/L (ref 0–35)
AST: 17 U/L (ref 0–37)
Albumin: 4.1 g/dL (ref 3.5–5.2)
Alkaline Phosphatase: 91 U/L (ref 39–117)
BUN: 12 mg/dL (ref 6–23)
CO2: 26 mEq/L (ref 19–32)
Calcium: 9.4 mg/dL (ref 8.4–10.5)
Chloride: 104 mEq/L (ref 96–112)
Creatinine, Ser: 0.74 mg/dL (ref 0.40–1.20)
GFR: 83.39 mL/min (ref >60.00)
Glucose, Bld: 94 mg/dL (ref 70–99)
Potassium: 3.9 mEq/L (ref 3.5–5.1)
Sodium: 137 mEq/L (ref 135–145)
Total Bilirubin: 0.3 mg/dL (ref 0.2–1.2)
Total Protein: 7.3 g/dL (ref 6.0–8.3)

## 2015-01-15 LAB — CBC WITH DIFFERENTIAL/PLATELET
Basophils Absolute: 0.1 10*3/uL (ref 0.0–0.1)
Basophils Relative: 0.8 % (ref 0.0–3.0)
Eosinophils Absolute: 0.2 10*3/uL (ref 0.0–0.7)
Eosinophils Relative: 2.5 % (ref 0.0–5.0)
HCT: 42.3 % (ref 36.0–46.0)
Hemoglobin: 13.8 g/dL (ref 12.0–15.0)
Lymphocytes Relative: 29 % (ref 12.0–46.0)
Lymphs Abs: 2.5 10*3/uL (ref 0.7–4.0)
MCHC: 32.7 g/dL (ref 30.0–36.0)
MCV: 87.3 fl (ref 78.0–100.0)
Monocytes Absolute: 0.7 10*3/uL (ref 0.1–1.0)
Monocytes Relative: 8.2 % (ref 3.0–12.0)
Neutro Abs: 5.1 10*3/uL (ref 1.4–7.7)
Neutrophils Relative %: 59.5 % (ref 43.0–77.0)
Platelets: 395 10*3/uL (ref 150.0–400.0)
RBC: 4.85 Mil/uL (ref 3.87–5.11)
RDW: 15.5 % (ref 11.5–15.5)
WBC: 8.5 10*3/uL (ref 4.0–10.5)

## 2015-01-15 LAB — LIPID PANEL
Cholesterol: 191 mg/dL (ref 0–200)
HDL: 33.2 mg/dL — ABNORMAL LOW (ref >39.00)
NonHDL: 158.07
Total CHOL/HDL Ratio: 6
Triglycerides: 215 mg/dL — ABNORMAL HIGH (ref 0.0–149.0)
VLDL: 43 mg/dL — ABNORMAL HIGH (ref 0.0–40.0)

## 2015-01-15 LAB — LDL CHOLESTEROL, DIRECT: Direct LDL: 135 mg/dL

## 2015-01-17 ENCOUNTER — Telehealth: Payer: Self-pay | Admitting: Internal Medicine

## 2015-01-17 ENCOUNTER — Ambulatory Visit (INDEPENDENT_AMBULATORY_CARE_PROVIDER_SITE_OTHER): Payer: Medicare Other | Admitting: Family Medicine

## 2015-01-17 ENCOUNTER — Encounter: Payer: Self-pay | Admitting: Family Medicine

## 2015-01-17 VITALS — BP 110/60 | HR 95 | Temp 98.4°F | Ht 59.5 in | Wt 174.2 lb

## 2015-01-17 DIAGNOSIS — R1030 Lower abdominal pain, unspecified: Secondary | ICD-10-CM | POA: Diagnosis not present

## 2015-01-17 DIAGNOSIS — R110 Nausea: Secondary | ICD-10-CM | POA: Insufficient documentation

## 2015-01-17 DIAGNOSIS — I10 Essential (primary) hypertension: Secondary | ICD-10-CM

## 2015-01-17 DIAGNOSIS — E7800 Pure hypercholesterolemia, unspecified: Secondary | ICD-10-CM

## 2015-01-17 DIAGNOSIS — M549 Dorsalgia, unspecified: Secondary | ICD-10-CM

## 2015-01-17 DIAGNOSIS — E871 Hypo-osmolality and hyponatremia: Secondary | ICD-10-CM

## 2015-01-17 DIAGNOSIS — G8929 Other chronic pain: Secondary | ICD-10-CM

## 2015-01-17 DIAGNOSIS — K219 Gastro-esophageal reflux disease without esophagitis: Secondary | ICD-10-CM

## 2015-01-17 LAB — POCT URINALYSIS DIPSTICK
Bilirubin, UA: NEGATIVE
Glucose, UA: NEGATIVE
Leukocytes, UA: NEGATIVE
Nitrite, UA: NEGATIVE
Protein, UA: NEGATIVE
Spec Grav, UA: 1.02
Urobilinogen, UA: 0.2
pH, UA: 6

## 2015-01-17 LAB — POCT UA - MICROSCOPIC ONLY

## 2015-01-17 MED ORDER — EZETIMIBE 10 MG PO TABS: 10.0000 mg | ORAL_TABLET | Freq: Every day | ORAL | Status: DC

## 2015-01-17 NOTE — Telephone Encounter (Signed)
Patient states she is having problems with nausea and belching again. She is asking if she can have the test that Dr [name] recommended in March. Please, advise.

## 2015-01-17 NOTE — Assessment & Plan Note (Signed)
Not at goal . Start zetia. Encouraged exercise, weight loss, healthy eating habits.

## 2015-01-17 NOTE — Patient Instructions (Addendum)
Make appt for follow up with GI given nausea not  Much improved.  Start zetia  For cholesterol control.  Do not take BC powder, it can irritate stomach.  Use tylenol instead.  Also .Marland Kitchen Undermicroscope.. No red blood cells in urine. No further eval needed.

## 2015-01-17 NOTE — Assessment & Plan Note (Signed)
She does take BC powder. Stop as may be irritating stomach/. If not improving follow up with Dr. [name] GI.

## 2015-01-17 NOTE — Assessment & Plan Note (Signed)
Well controlled. Continue current medication.  

## 2015-01-17 NOTE — Telephone Encounter (Signed)
Scheduled at [facility] radiology on 01/21/15 11:00 AM. NPO 3 hours prior(Tiffany Orozco). Patient notified of appointment and instructions.

## 2015-01-17 NOTE — Assessment & Plan Note (Signed)
UA clear, no blood on micro. Use tylenol for pain.

## 2015-01-17 NOTE — Assessment & Plan Note (Signed)
Resolved

## 2015-01-17 NOTE — Telephone Encounter (Signed)
Please obtain Barium esophagram with tablet :" GERD"

## 2015-01-17 NOTE — Progress Notes (Signed)
Pre visit review using our clinic review tool, if applicable. No additional management support is needed unless otherwise documented below in the visit note. 

## 2015-01-17 NOTE — Progress Notes (Signed)
64 year olf female presents for 3 month follow up.  Gastritis, GERD, dysphagia: Dr. [name]: Upper endoscopy in February 2016 showed grade 1 esophagitis. Mild esophageal stricture which was dilated with 16 mm dilator. There was no Barrett's esophagus.  She continues to have nausea. Smells trigger it.  Took protonix.Marland Kitchen Helped some but cannot take because it also makes her sick. Phenergan and zofran not very helpful in past.  She has been using BC for some back pain.  Depression, moderate control.. On effexor 75 mg daily. Husband just finished cancer treatment.  Hypertension:  Well controlled on amlodipine and HCTZ. BP Readings from Last 3 Encounters:  01/17/15 110/60  10/15/14 133/77  08/06/14 132/74   Elevated Cholesterol: HDL low, but LDL NO LONGER AT goal <70 Off of simvastatin ( Had SE). HX of CVA on aggrenox. She tried to  restart pravastatin as instructed at last OV. Hb/c muscle aches and dizziness.ad to stop   Recent Labs    Lab Results  Component Value Date   CHOL 191 01/15/2015   HDL 33.20* 01/15/2015   LDLCALC 130* 10/10/2014   LDLDIRECT 135.0 01/15/2015   TRIG 215.0* 01/15/2015   CHOLHDL 6 01/15/2015   Using medications without problems:None  Muscle aches: None  Diet compliance: poor appetite, has to avoid a lot things due to stomach irritation ( greasy, dairy, sweet things) Exercise: None due to hip pain  Wt Readings from Last 3 Encounters:  01/17/15 174 lb 4 oz (79.039 kg)  10/15/14 171 lb (77.565 kg)  08/06/14 174 lb 12.8 oz (79.289 kg)    Review of Systems  Constitutional: Positive for fatigue. Negative for fever and unexpected weight change.  HENT: Negative for congestion, ear pain, sinus pressure, sneezing, sore throat and trouble swallowing.  Eyes: Negative for pain and itching.  Respiratory: Negative for cough, shortness of breath and wheezing.  Cardiovascular: Negative for chest pain, palpitations and leg swelling.  Gastrointestinal:  Negative for nausea, abdominal pain, diarrhea, constipation and blood in stool.  Genitourinary: Negative for dysuria, hematuria, vaginal discharge, difficulty urinating and menstrual problem.  Skin: Negative for rash.  Neurological: Negative for syncope, weakness, light-headedness, numbness and headaches.  Psychiatric/Behavioral: Negative for confusion and dysphoric mood. The patient is not nervous/anxious.              Physical Exam  Constitutional: She is oriented to person, place, and time and well-developed, well-nourished, and in no distress.  HENT:  Head: Normocephalic.  Right Ear: External ear normal.  Left Ear: External ear normal.  Mouth/Throat: No oropharyngeal exudate.  Eyes: Conjunctivae are normal. Pupils are equal, round, and reactive to light.  Neck: Normal range of motion. Neck supple. No thyromegaly present.  Cardiovascular: Normal rate and regular rhythm.   Pulmonary/Chest: Effort normal and breath sounds normal. She has no wheezes.  Abdominal: Soft. Bowel sounds are normal. There is no tenderness.  Neurological: She is alert and oriented to person, place, and time.  Skin: Skin is warm. No rash noted.  Psychiatric: Mood, memory, affect and judgment normal.  Diabetic foot exam: Normal inspection No skin breakdown No calluses  Normal DP pulses Normal sensation to light touch and monofilament Nails normal

## 2015-01-21 ENCOUNTER — Ambulatory Visit (HOSPITAL_COMMUNITY)
Admission: RE | Admit: 2015-01-21 | Discharge: 2015-01-21 | Disposition: A | Discharge status: Home / Self Care | Payer: Medicare Other | Source: Ambulatory Visit | Attending: Internal Medicine | Admitting: Internal Medicine

## 2015-01-21 DIAGNOSIS — R1310 Dysphagia, unspecified: Secondary | ICD-10-CM | POA: Diagnosis not present

## 2015-01-21 DIAGNOSIS — R142 Eructation: Secondary | ICD-10-CM | POA: Insufficient documentation

## 2015-01-21 DIAGNOSIS — K219 Gastro-esophageal reflux disease without esophagitis: Secondary | ICD-10-CM

## 2015-01-21 DIAGNOSIS — R112 Nausea with vomiting, unspecified: Secondary | ICD-10-CM | POA: Insufficient documentation

## 2015-01-22 ENCOUNTER — Telehealth: Payer: Self-pay | Admitting: Internal Medicine

## 2015-01-22 ENCOUNTER — Other Ambulatory Visit: Payer: Self-pay | Admitting: *Deleted

## 2015-01-22 DIAGNOSIS — R1314 Dysphagia, pharyngoesophageal phase: Secondary | ICD-10-CM

## 2015-01-22 DIAGNOSIS — R112 Nausea with vomiting, unspecified: Secondary | ICD-10-CM

## 2015-01-22 NOTE — Telephone Encounter (Signed)
See results note. 

## 2015-01-25 ENCOUNTER — Emergency Department (HOSPITAL_COMMUNITY)
Admission: EM | Admit: 2015-01-25 | Discharge: 2015-01-25 | Disposition: A | Discharge status: Home / Self Care | Payer: Medicare Other | Attending: Emergency Medicine | Admitting: Emergency Medicine

## 2015-01-25 ENCOUNTER — Emergency Department (HOSPITAL_COMMUNITY): Payer: Medicare Other

## 2015-01-25 ENCOUNTER — Encounter (HOSPITAL_COMMUNITY): Payer: Self-pay | Admitting: Emergency Medicine

## 2015-01-25 DIAGNOSIS — R110 Nausea: Secondary | ICD-10-CM | POA: Diagnosis not present

## 2015-01-25 DIAGNOSIS — Z79899 Other long term (current) drug therapy: Secondary | ICD-10-CM | POA: Diagnosis not present

## 2015-01-25 DIAGNOSIS — R140 Abdominal distension (gaseous): Secondary | ICD-10-CM | POA: Diagnosis not present

## 2015-01-25 DIAGNOSIS — Z7982 Long term (current) use of aspirin: Secondary | ICD-10-CM | POA: Insufficient documentation

## 2015-01-25 DIAGNOSIS — Z85828 Personal history of other malignant neoplasm of skin: Secondary | ICD-10-CM | POA: Insufficient documentation

## 2015-01-25 DIAGNOSIS — J449 Chronic obstructive pulmonary disease, unspecified: Secondary | ICD-10-CM | POA: Insufficient documentation

## 2015-01-25 DIAGNOSIS — R011 Cardiac murmur, unspecified: Secondary | ICD-10-CM | POA: Diagnosis not present

## 2015-01-25 DIAGNOSIS — Z8673 Personal history of transient ischemic attack (TIA), and cerebral infarction without residual deficits: Secondary | ICD-10-CM | POA: Diagnosis not present

## 2015-01-25 DIAGNOSIS — R0789 Other chest pain: Secondary | ICD-10-CM | POA: Diagnosis not present

## 2015-01-25 DIAGNOSIS — Z862 Personal history of diseases of the blood and blood-forming organs and certain disorders involving the immune mechanism: Secondary | ICD-10-CM | POA: Insufficient documentation

## 2015-01-25 DIAGNOSIS — Z8659 Personal history of other mental and behavioral disorders: Secondary | ICD-10-CM | POA: Diagnosis not present

## 2015-01-25 DIAGNOSIS — Z720 Tobacco use: Secondary | ICD-10-CM | POA: Insufficient documentation

## 2015-01-25 DIAGNOSIS — I10 Essential (primary) hypertension: Secondary | ICD-10-CM | POA: Insufficient documentation

## 2015-01-25 DIAGNOSIS — R29810 Facial weakness: Secondary | ICD-10-CM | POA: Insufficient documentation

## 2015-01-25 DIAGNOSIS — E669 Obesity, unspecified: Secondary | ICD-10-CM | POA: Insufficient documentation

## 2015-01-25 LAB — URINALYSIS, ROUTINE W REFLEX MICROSCOPIC
Bilirubin Urine: NEGATIVE
Glucose, UA: NEGATIVE mg/dL
Ketones, ur: NEGATIVE mg/dL
Leukocytes, UA: NEGATIVE
Nitrite: NEGATIVE
Protein, ur: NEGATIVE mg/dL
Specific Gravity, Urine: 1.009 (ref 1.005–1.030)
Urobilinogen, UA: 0.2 mg/dL (ref 0.0–1.0)
pH: 5.5 (ref 5.0–8.0)

## 2015-01-25 LAB — COMPREHENSIVE METABOLIC PANEL
ALT: 24 U/L (ref 14–54)
AST: 21 U/L (ref 15–41)
Albumin: 3.6 g/dL (ref 3.5–5.0)
Alkaline Phosphatase: 98 U/L (ref 38–126)
Anion gap: 9 (ref 5–15)
BUN: 10 mg/dL (ref 6–20)
CO2: 21 mmol/L — ABNORMAL LOW (ref 22–32)
Calcium: 9.2 mg/dL (ref 8.9–10.3)
Chloride: 103 mmol/L (ref 101–111)
Creatinine, Ser: 0.78 mg/dL (ref 0.44–1.00)
GFR calc Af Amer: >60 mL/min (ref >60)
GFR calc non Af Amer: >60 mL/min (ref >60)
Glucose, Bld: 100 mg/dL — ABNORMAL HIGH (ref 65–99)
Potassium: 3.9 mmol/L (ref 3.5–5.1)
Sodium: 133 mmol/L — ABNORMAL LOW (ref 135–145)
Total Bilirubin: 0.4 mg/dL (ref 0.3–1.2)
Total Protein: 7 g/dL (ref 6.5–8.1)

## 2015-01-25 LAB — I-STAT CHEM 8, ED
BUN: 12 mg/dL (ref 6–20)
Calcium, Ion: 1.12 mmol/L — ABNORMAL LOW (ref 1.13–1.30)
Chloride: 102 mmol/L (ref 101–111)
Creatinine, Ser: 0.7 mg/dL (ref 0.44–1.00)
Glucose, Bld: 99 mg/dL (ref 65–99)
HCT: 45 % (ref 36.0–46.0)
Hemoglobin: 15.3 g/dL — ABNORMAL HIGH (ref 12.0–15.0)
Potassium: 3.9 mmol/L (ref 3.5–5.1)
Sodium: 134 mmol/L — ABNORMAL LOW (ref 135–145)
TCO2: 19 mmol/L (ref 0–100)

## 2015-01-25 LAB — LIPASE, BLOOD: Lipase: 19 U/L — ABNORMAL LOW (ref 22–51)

## 2015-01-25 LAB — DIFFERENTIAL
Basophils Absolute: 0.1 10*3/uL (ref 0.0–0.1)
Basophils Relative: 1 % (ref 0–1)
Eosinophils Absolute: 0.1 10*3/uL (ref 0.0–0.7)
Eosinophils Relative: 1 % (ref 0–5)
Lymphocytes Relative: 26 % (ref 12–46)
Lymphs Abs: 2.7 10*3/uL (ref 0.7–4.0)
Monocytes Absolute: 0.8 10*3/uL (ref 0.1–1.0)
Monocytes Relative: 7 % (ref 3–12)
Neutro Abs: 6.9 10*3/uL (ref 1.7–7.7)
Neutrophils Relative %: 65 % (ref 43–77)

## 2015-01-25 LAB — CBC
HCT: 40.3 % (ref 36.0–46.0)
Hemoglobin: 13.7 g/dL (ref 12.0–15.0)
MCH: 28.8 pg (ref 26.0–34.0)
MCHC: 34 g/dL (ref 30.0–36.0)
MCV: 84.8 fL (ref 78.0–100.0)
Platelets: 382 10*3/uL (ref 150–400)
RBC: 4.75 MIL/uL (ref 3.87–5.11)
RDW: 14.5 % (ref 11.5–15.5)
WBC: 10.5 10*3/uL (ref 4.0–10.5)

## 2015-01-25 LAB — URINE MICROSCOPIC-ADD ON

## 2015-01-25 LAB — APTT: aPTT: 34 seconds (ref 24–37)

## 2015-01-25 LAB — I-STAT TROPONIN, ED: Troponin i, poc: 0 ng/mL (ref 0.00–0.08)

## 2015-01-25 LAB — PROTIME-INR
INR: 0.99 (ref 0.00–1.49)
Prothrombin Time: 13.3 seconds (ref 11.6–15.2)

## 2015-01-25 MED ORDER — ONDANSETRON HCL 4 MG PO TABS: 4.0000 mg | ORAL_TABLET | Freq: Four times a day (QID) | ORAL | Status: DC

## 2015-01-25 MED ORDER — ONDANSETRON HCL 4 MG/2ML IJ SOLN
4.0000 mg | Freq: Once | INTRAMUSCULAR | Status: AC
  Administered 2015-01-25: 4 mg via INTRAVENOUS
  Filled 2015-01-25: qty 2

## 2015-01-25 NOTE — ED Notes (Signed)
Grandson stated, I noticed when she came to my house that her face was drooping and that was 45 minutes ago. The drooping stopped by the time we got here. Pt was alert and oriented x 4.  Pt. Has a symmetrical smile. Pt. Stated, I have a lot of nausea in the last hour, no vomiting.  I have problem with my pancreas and I think it might be coming from that

## 2015-01-25 NOTE — ED Notes (Signed)
Patient left at this time with all belongings. Pt ambulatory at time of discharge, independently and with a steady gait. A/Ox4

## 2015-01-25 NOTE — Discharge Instructions (Signed)
Nausea, Adult Nausea is the feeling that you have an upset stomach or have to vomit. Nausea by itself is not likely a serious concern, but it may be an early sign of more serious medical problems. As nausea gets worse, it can lead to vomiting. If vomiting develops, there is the risk of dehydration.  CAUSES   Viral infections.  Food poisoning.  Medicines.  Pregnancy.  Motion sickness.  Migraine headaches.  Emotional distress.  Severe pain from any source.  Alcohol intoxication. HOME CARE INSTRUCTIONS  Get plenty of rest.  Ask your caregiver about specific rehydration instructions.  Eat small amounts of food and sip liquids more often.  Take all medicines as told by your caregiver. SEEK MEDICAL CARE IF:  You have not improved after 2 days, or you get worse.  You have a headache. SEEK IMMEDIATE MEDICAL CARE IF:   You have a fever.  You faint.  You keep vomiting or have blood in your vomit.  You are extremely weak or dehydrated.  You have dark or bloody stools.  You have severe chest or abdominal pain. MAKE SURE YOU:  Understand these instructions.  Will watch your condition.  Will get help right away if you are not doing well or get worse. Document Released: 06/24/2004 Document Revised: 02/09/2012 Document Reviewed: 01/27/2011 [facility] Patient Information 2015 Tiffany Orozco, [place]. This information is not intended to replace advice given to you by your health care provider. Make sure you discuss any questions you have with your health care provider. Weakness Weakness is a lack of strength. It may be felt all over the body (generalized) or in one specific part of the body (focal). Some causes of weakness can be serious. You may need further medical evaluation, especially if you are elderly or you have a history of immunosuppression (such as chemotherapy or HIV), kidney disease, heart disease, or diabetes. CAUSES  Weakness can be caused by many different things,  including:  Infection.  Physical exhaustion.  Internal bleeding or other blood loss that results in a lack of red blood cells (anemia).  Dehydration. This cause is more common in elderly people.  Side effects or electrolyte abnormalities from medicines, such as pain medicines or sedatives.  Emotional distress, anxiety, or depression.  Circulation problems, especially severe peripheral arterial disease.  Heart disease, such as rapid atrial fibrillation, bradycardia, or heart failure.  Nervous system disorders, such as Guillain-Barr syndrome, multiple sclerosis, or stroke. DIAGNOSIS  To find the cause of your weakness, your caregiver will take your history and perform a physical exam. Lab tests or X-rays may also be ordered, if needed. TREATMENT  Treatment of weakness depends on the cause of your symptoms and can vary greatly. HOME CARE INSTRUCTIONS   Rest as needed.  Eat a well-balanced diet.  Try to get some exercise every day.  Only take over-the-counter or prescription medicines as directed by your caregiver. SEEK MEDICAL CARE IF:   Your weakness seems to be getting worse or spreads to other parts of your body.  You develop new aches or pains. SEEK IMMEDIATE MEDICAL CARE IF:   You cannot perform your normal daily activities, such as getting dressed and feeding yourself.  You cannot walk up and down stairs, or you feel exhausted when you do so.  You have shortness of breath or chest pain.  You have difficulty moving parts of your body.  You have weakness in only one area of the body or on only one side of the body.  You  have a fever.  You have trouble speaking or swallowing.  You cannot control your bladder or bowel movements.  You have black or bloody vomit or stools. MAKE SURE YOU:  Understand these instructions.  Will watch your condition.  Will get help right away if you are not doing well or get worse. Document Released: 05/17/2005 Document  Revised: 11/16/2011 Document Reviewed: 07/16/2011 [facility] Patient Information 2015 [place], [place]. This information is not intended to replace advice given to you by your health care provider. Make sure you discuss any questions you have with your health care provider.

## 2015-01-25 NOTE — ED Notes (Signed)
Pt's grandson with the pt states that the pt had a facial droop when he saw her 45 minutes prior to arrival. Pt has not been seen by anyone else since 0630 this morning and she was sleeping then. Pt states that she does not feel weak on either side of her face or body. Grips and all motor skills are equal. Pt has symmetrical smile but slight droop noted to the right side when talking.

## 2015-01-25 NOTE — ED Notes (Signed)
Pt ambulated to the bathroom to give urine specimen

## 2015-01-25 NOTE — ED Provider Notes (Signed)
CSN: [id number]     Arrival date & time 01/25/15  1319 History   First MD Initiated Contact with Patient 01/25/15 1533     Chief Complaint  Patient presents with  . Facial Droop  . Bloated  . Nausea     The history is provided by the patient and a relative. No language interpreter was used.   Ms. Selle presents for evaluation of facial droop. History is provided by the patient and her grandson. Last time seen normal was unclear. Her grandson picked her up about 45 minutes prior to ED arrival and he noted that the right side of her face was drooping. He states it still appears to be drooping but is improved from earlier. The patient endorses nausea and central abdominal discomfort. The nausea and abdominal discomfort have been an ongoing problem are slightly worse than usual. She also endorses a slight headache. She has a history of CVA in 2012 and takes Aggrenox. Symptoms are mild, constant, improving   Past Medical History  Diagnosis Date  . Anxiety   . Depression   . GERD (gastroesophageal reflux disease)   . Hypothyroidism   . Skin cancer of face   . Skin cancer of arm   . Allergy   . COPD (chronic obstructive pulmonary disease)   . Hyperlipidemia   . Hypertension   . Obesity   . DDD (degenerative disc disease), lumbar     mild  . Stroke 07/2010  . Osteopenia   . Heart murmur   . Thrombocytopenia   . Female rectocele without uterine prolapse    Past Surgical History  Procedure Laterality Date  . Tubal ligation  1975  . Appendectomy    . Foot surgery  03/31/2009    R foot  . Emgs  7/04    atms and wrists neg  . Cholecystectomy  2004  . Foot surgery  2008, 2009, 2010    right foot    Family History  Problem Relation Age of Onset  . Skin cancer Father   . Hypertension Mother    Social History  Substance Use Topics  . Smoking status: Current Every Day Smoker -- 1.00 packs/day for 31 years    Types: Cigarettes  . Smokeless tobacco: Never Used     Comment: May  try when husband finished treatment for prostate cancer  . Alcohol Use: No   OB History    Gravida Para Term Preterm AB TAB SAB Ectopic Multiple Living   5 2   3  3   3      Review of Systems  All other systems reviewed and are negative.     Allergies  Lisinopril; Niacin; Welchol; Augmentin; Codeine; and Oxycodone hcl  Home Medications   Prior to Admission medications   Medication Sig Start Date End Date Taking? Authorizing Provider  AGGRENOX 25-200 MG per 12 hr capsule TAKE ONE CAPSULE BY MOUTH TWICE A DAY 11/18/14  Yes Amy E Bedsole, MD  amLODipine (NORVASC) 10 MG tablet TAKE 1 TABLET (10 MG TOTAL) BY MOUTH DAILY. 12/26/14  Yes Amy Cletis [place], MD  Aspirin-Salicylamide-Caffeine (BC HEADACHE POWDER PO) Take 1 packet by mouth every 8 (eight) hours as needed (pain).   Yes Historical Provider, MD  cetirizine (ZYRTEC) 10 MG tablet Take 10 mg by mouth daily.   Yes Historical Provider, MD  Cholecalciferol (VITAMIN D-3) 1000 UNITS CAPS Take 2,000 Units by mouth daily.   Yes Historical Provider, MD  ezetimibe (ZETIA) 10 MG tablet Take 1  tablet (10 mg total) by mouth daily. 01/17/15  Yes Amy Cletis Athens, MD  hydrochlorothiazide (HYDRODIURIL) 25 MG tablet Take 1 tablet (25 mg total) by mouth daily. 12/25/13  Yes Amy E Bedsole, MD  NATURE-THROID 97.5 MG TABS TAKE 1 TABLET BY MOUTH EVERY DAY 12/06/14  Yes Amy E Bedsole, MD  polyethylene glycol powder (GLYCOLAX/MIRALAX) powder 17 grams daily prn constipation. Patient taking differently: Take 1 Container by mouth daily as needed.  07/19/14  Yes Amy E Bedsole, MD  ondansetron (ZOFRAN) 4 MG tablet Take 1 tablet (4 mg total) by mouth every 6 (six) hours. 01/25/15   Quintella Reichert, MD   BP 117/61 mmHg  Pulse 80  Temp(Src) 97.6 F (36.4 C) (Oral)  Resp 20  Ht 5' (1.524 m)  Wt 170 lb (77.111 kg)  BMI 33.20 kg/m2  SpO2 96% Physical Exam  Constitutional: She is oriented to person, place, and time. She appears well-developed and well-nourished.  HENT:   Head: Normocephalic and atraumatic.  Eyes: EOM are normal. Pupils are equal, round, and reactive to light.  Cardiovascular: Normal rate and regular rhythm.   No murmur heard. Pulmonary/Chest: Effort normal and breath sounds normal. No respiratory distress.  Abdominal: Soft. There is no tenderness. There is no rebound and no guarding.  Musculoskeletal: She exhibits no edema or tenderness.  Neurological: She is alert and oriented to person, place, and time.  Mild right lower facial droop with minimal flattening of the right nasolabial fold. On effort patient has symmetrical facial movements. 5 out of 5 strength in all 4 extremities, sensation to light touch intact in all 4 extremities. No pronator drift.  Skin: Skin is warm and dry.  Psychiatric: She has a normal mood and affect. Her behavior is normal.  Nursing note and vitals reviewed.   ED Course  Procedures (including critical care time) Labs Review Labs Reviewed  COMPREHENSIVE METABOLIC PANEL - Abnormal; Notable for the following:    Sodium 133 (*)    CO2 21 (*)    Glucose, Bld 100 (*)    All other components within normal limits  URINALYSIS, ROUTINE W REFLEX MICROSCOPIC (NOT AT [facility]) - Abnormal; Notable for the following:    APPearance CLOUDY (*)    Hgb urine dipstick TRACE (*)    All other components within normal limits  LIPASE, BLOOD - Abnormal; Notable for the following:    Lipase 19 (*)    All other components within normal limits  URINE MICROSCOPIC-ADD ON - Abnormal; Notable for the following:    Squamous Epithelial / LPF FEW (*)    All other components within normal limits  I-STAT CHEM 8, ED - Abnormal; Notable for the following:    Sodium 134 (*)    Calcium, Ion 1.12 (*)    Hemoglobin 15.3 (*)    All other components within normal limits  PROTIME-INR  APTT  CBC  DIFFERENTIAL  I-STAT TROPOININ, ED  CBG MONITORING, ED    Imaging Review Dg Chest 2 View  01/25/2015   CLINICAL DATA:  Right sided facial droop.  Pt c/o dizziness, blurred vision, facial droop, nausea/vomiting, abdominal pain, belching, discomfort in chest, bloating, and SOB. Hx stroke, CHF, HTN controlled with medication, and a current smoker of less than 1 PPD.  EXAM: CHEST  2 VIEW  COMPARISON:  08/07/2013  FINDINGS: Cardiac silhouette is top-normal size. No mediastinal or hilar masses or evidence of adenopathy.  Mild bilateral interstitial prominence. No lung consolidation or evidence of pulmonary edema. No pleural effusion  or pneumothorax.  Bony thorax is demineralized but grossly intact.  IMPRESSION: No acute cardiopulmonary disease.   Electronically Signed   By: Lajean Manes M.D.   On: 01/25/2015 16:47   Ct Head Wo Contrast  01/25/2015   CLINICAL DATA:  Right-sided facial droop and possible mild left-sided facial droop. Nausea.  EXAM: CT HEAD WITHOUT CONTRAST  TECHNIQUE: Contiguous axial images were obtained from the base of the skull through the vertex without intravenous contrast.  COMPARISON:  CT 08/24/2010 and MRI 08/25/2010  FINDINGS: Ventricles, cisterns and other CSF spaces are normal. No evidence of mass, mass effect, shift of midline structures or acute hemorrhage. No evidence of acute infarction. Possible tiny old lacunar infarct over the left lentiform nucleus. Bones and soft tissues are normal.  IMPRESSION: No acute intracranial findings.   Electronically Signed   By: Marin Olp M.D.   On: 01/25/2015 17:01   Mr Brain Wo Contrast  01/25/2015   CLINICAL DATA:  Right facial droop  EXAM: MRI HEAD WITHOUT CONTRAST  TECHNIQUE: Multiplanar, multiecho pulse sequences of the brain and surrounding structures were obtained without intravenous contrast.  COMPARISON:  CT head 01/25/2015  FINDINGS: Negative for acute infarct. Mild chronic microvascular ischemic change in the white matter. Basal ganglia and brainstem normal. Chronic infarct left body of caudate.  Ventricle size is normal.  Pituitary is normal in size  Negative for hemorrhage or  mass.  No edema or mass-effect.  Paranasal sinuses clear.  Normal orbit.  IMPRESSION: Mild chronic microvascular ischemic change in the white matter. No acute abnormality.   Electronically Signed   By: Franchot Gallo M.D.   On: 01/25/2015 19:51   I have personally reviewed and evaluated these images and lab results as part of my medical decision-making.   EKG Interpretation   Date/Time:  Saturday January 25 2015 13:27:16 EDT Ventricular Rate:  90 PR Interval:  158 QRS Duration: 86 QT Interval:  354 QTC Calculation: 433 R Axis:   62 Text Interpretation:  Normal sinus rhythm Normal ECG Confirmed by Hazle Coca [phone]) on 01/25/2015 3:44:29 PM      MDM   Final diagnoses:  Nausea    Patient here for evaluation of nausea and possible right-sided facial weakness. She does have some slight asymmetry of her lower lip on examination but this is not reproducible with activity or strength testing. MRI does not demonstrate any acute stroke. History and presentation is not consistent with TIA. In terms of nausea this is an ongoing problem and she has been followed by GI for this. Discussed home care for nausea with Zofran, GI follow-up as well as PCP follow-up. History and presentation is not consistent with ACS, significant arrhythmia, serious bacterial infection. Return precautions were discussed.    Quintella Reichert, MD 01/25/15 [phone]

## 2015-01-31 ENCOUNTER — Other Ambulatory Visit: Payer: Self-pay | Admitting: Family Medicine

## 2015-01-31 NOTE — Telephone Encounter (Signed)
Pt said since taking the Nature-Thyroid 97.5 MG TABS she has been having a lot of GI issues, pt said that she has been feeling very nauseous and has vomited a few times and also feels very bloated. Pt said these sxs didn't start until she started taking the nature-thyroid so she is requesting that we fill her Armour Thyroid to see if that will help her GI issues. Pt wanted me to also ask if she thinks her thyroid is out of "wack" and that might be why she is having these sxs. Pt request call back to let her know what to do

## 2015-02-01 NOTE — Telephone Encounter (Signed)
Ok to change from [place] Thyroid to Armour Thyroid?

## 2015-02-04 ENCOUNTER — Encounter (HOSPITAL_COMMUNITY)
Admission: RE | Admit: 2015-02-04 | Discharge: 2015-02-04 | Disposition: A | Discharge status: Home / Self Care | Payer: Medicare Other | Source: Ambulatory Visit | Attending: Internal Medicine | Admitting: Internal Medicine

## 2015-02-04 ENCOUNTER — Telehealth: Payer: Self-pay | Admitting: *Deleted

## 2015-02-04 DIAGNOSIS — R1314 Dysphagia, pharyngoesophageal phase: Secondary | ICD-10-CM | POA: Insufficient documentation

## 2015-02-04 DIAGNOSIS — R112 Nausea with vomiting, unspecified: Secondary | ICD-10-CM | POA: Insufficient documentation

## 2015-02-04 MED ORDER — TECHNETIUM TC 99M SULFUR COLLOID: 2.2000 | Freq: Once | INTRAVENOUS | Status: DC

## 2015-02-04 NOTE — Telephone Encounter (Signed)
Received a call from radiology that patient could not keep the egg down for the GES so test was not completed. She has a f/u OV with Lori Hvozdovic, PA-C on 02/06/15. Any thing else for patient to do? [employer] pt. DOD- please, advise.

## 2015-02-04 NOTE — Telephone Encounter (Signed)
Should try a repeat attempt at barium esophagram

## 2015-02-05 NOTE — Telephone Encounter (Signed)
Spoke with patient and she wants to keep OV and decide then what she should do.

## 2015-02-06 ENCOUNTER — Ambulatory Visit (INDEPENDENT_AMBULATORY_CARE_PROVIDER_SITE_OTHER): Payer: Medicare Other | Admitting: Physician Assistant

## 2015-02-06 ENCOUNTER — Encounter: Payer: Self-pay | Admitting: Physician Assistant

## 2015-02-06 VITALS — BP 124/60 | HR 82 | Ht 60.0 in | Wt 177.1 lb

## 2015-02-06 DIAGNOSIS — K589 Irritable bowel syndrome without diarrhea: Secondary | ICD-10-CM | POA: Diagnosis not present

## 2015-02-06 DIAGNOSIS — K2100 Gastro-esophageal reflux disease with esophagitis, without bleeding: Secondary | ICD-10-CM

## 2015-02-06 DIAGNOSIS — R1310 Dysphagia, unspecified: Secondary | ICD-10-CM

## 2015-02-06 MED ORDER — ONDANSETRON HCL 4 MG PO TABS: 4.0000 mg | ORAL_TABLET | Freq: Three times a day (TID) | ORAL | Status: DC | PRN

## 2015-02-06 MED ORDER — RIFAXIMIN 550 MG PO TABS: 550.0000 mg | ORAL_TABLET | Freq: Three times a day (TID) | ORAL | Status: DC

## 2015-02-06 MED ORDER — SUCRALFATE 1 G PO TABS: 1.0000 g | ORAL_TABLET | Freq: Three times a day (TID) | ORAL | Status: DC

## 2015-02-06 NOTE — Progress Notes (Signed)
Patient ID: Tiffany Orozco, female   DOB: April 11, 1951, 64 y.o.   MRN: [id number]     History of Present Illness: Tiffany Orozco is a 64 year old female who was previously followed by Dr. [name]. She has a history of GERD, hypothyroidism, anxiety, depression, COPD, hypertension, hyperlipidemia, stroke, osteopenia, and thrombocytopenia. She was evaluated in the office in January 2016 with dysphagia and epigastric pain. She had been started on Nexium and felt it provided no relief. An abdominal ultrasound was obtained which was nonrevealing. She was scheduled for an esophagram but was unable to tolerate the test. She had an EGD on 07/03/2014 that revealed grade 1 esophagitis. She was seen in the office by Dr. Maurene Capes in March and at that time reported that she had increased her proton next to 40 mg twice daily and was markedly improved with no episodes of regurgitation or choking. The decision was made to hold off on a surgical evaluation, and Dr. [name] explained to the patient that she would need a repeat attempt at barium esophagram before deciding about a Nissen fundoplication. The patient called in mid August stating she was again having problems with nausea and belching. Dr. Maurene Capes ordered a barium esophagram with tablet. The patient was unable to complete the test. A gastric emptying scan was ordered, but the patient could not keep the egg down, so the test was not completed. Patient returns today for follow-up. She is having frequent burning in her throat and chest pressure. She states that all PPIs make her symptoms worse. She reports that she has tried Zantac and famotidine with no relief. She is experiencing dysphagia to solids and liquids. She states she initially felt better on Protonix but then it stopped working. She often feels as if she has a lump in her throat. She does not feel as if food is going down the wrong pipe and she does not cough or sputters while eating. She complains of excess gas and  bloating with foul-smelling flatulence. Her bowel movements continue to alternate between days of formed stools and days of loose stools.   Past Medical History  Diagnosis Date  . Anxiety   . Depression   . GERD (gastroesophageal reflux disease)   . Hypothyroidism   . Skin cancer of face   . Skin cancer of arm   . Allergy   . COPD (chronic obstructive pulmonary disease)   . Hyperlipidemia   . Hypertension   . Obesity   . DDD (degenerative disc disease), lumbar     mild  . Stroke 07/2010  . Osteopenia   . Heart murmur   . Thrombocytopenia   . Female rectocele without uterine prolapse     Past Surgical History  Procedure Laterality Date  . Tubal ligation  1975  . Appendectomy    . Foot surgery  03/31/2009    R foot  . Emgs  7/04    atms and wrists neg  . Cholecystectomy  2004  . Foot surgery  2008, 2009, 2010    right foot    Family History  Problem Relation Age of Onset  . Skin cancer Father   . Hypertension Mother    Social History  Substance Use Topics  . Smoking status: Current Every Day Smoker -- 1.00 packs/day for 31 years    Types: Cigarettes  . Smokeless tobacco: Never Used     Comment: May try when husband finished treatment for prostate cancer  . Alcohol Use: No   Current Outpatient Prescriptions  Medication Sig Dispense Refill  . AGGRENOX 25-200 MG per 12 hr capsule TAKE ONE CAPSULE BY MOUTH TWICE A DAY 180 capsule 1  . amLODipine (NORVASC) 10 MG tablet TAKE 1 TABLET (10 MG TOTAL) BY MOUTH DAILY. 30 tablet 5  . ARMOUR THYROID 90 MG tablet TAKE 1 TABLET (90 MG TOTAL) BY MOUTH DAILY. 90 tablet 0  . Aspirin-Salicylamide-Caffeine (BC HEADACHE POWDER PO) Take 1 packet by mouth every 8 (eight) hours as needed (pain).    . cetirizine (ZYRTEC) 10 MG tablet Take 10 mg by mouth daily.    . Cholecalciferol (VITAMIN D-3) 1000 UNITS CAPS Take 2,000 Units by mouth daily.    Marland Kitchen ezetimibe (ZETIA) 10 MG tablet Take 1 tablet (10 mg total) by mouth daily. 30 tablet 11  .  hydrochlorothiazide (HYDRODIURIL) 25 MG tablet Take 1 tablet (25 mg total) by mouth daily. 90 tablet 3  . ondansetron (ZOFRAN) 4 MG tablet Take 1 tablet (4 mg total) by mouth every 8 (eight) hours as needed for nausea. 60 tablet 1  . polyethylene glycol powder (GLYCOLAX/MIRALAX) powder 17 grams daily prn constipation. (Patient taking differently: Take 1 Container by mouth daily as needed. ) 500 g 0  . rifaximin (XIFAXAN) 550 MG TABS tablet Take 1 tablet (550 mg total) by mouth 3 (three) times daily. 42 tablet 0  . sucralfate (CARAFATE) 1 G tablet Take 1 tablet (1 g total) by mouth 4 (four) times daily -  with meals and at bedtime. 120 tablet 3   No current facility-administered medications for this visit.   Facility-Administered Medications Ordered in Other Visits  Medication Dose Route Frequency Provider Last Rate Last Dose  . technetium sulfur colloid (NYCOMED-Alpaugh) injection solution 2.2 milli Curie  2.2 milli Curie Intravenous Once Medication Radiologist, MD       Allergies  Allergen Reactions  . Lisinopril     Shortness of breath.  . Niacin     REACTION: dizziness  . Welchol [Colesevelam Hcl] Other (See Comments)    Diarrhea,Nausea, swelling in face  . Augmentin [Amoxicillin-Pot Clavulanate]     Nauseated. GI upset with taking.  . Codeine     keeps patient awake  . Oxycodone Hcl     Patient states this medication makes her hyper     Review of Systems: Gen: Denies any fever, chills, sweats, anorexia, fatigue, weakness, malaise, weight loss, and sleep disorder CV: Denies chest pain, angina, palpitations, syncope, orthopnea, PND, peripheral edema, and claudication. Resp: Denies dyspnea at rest, dyspnea with exercise, cough, sputum, wheezing, coughing up blood, and pleurisy. GI: Denies vomiting blood, jaundice, and fecal incontinence.  Has dysphagia to solids and liquids GU : Denies urinary burning, blood in urine, urinary frequency, urinary hesitancy, nocturnal urination, and  urinary incontinence. MS: Denies joint pain, limitation of movement, and swelling, stiffness, low back pain, extremity pain. Denies muscle weakness, cramps, atrophy.  Derm: Denies rash, itching, dry skin, hives, moles, warts, or unhealing ulcers.  Psych: Denies depression, anxiety, memory loss, suicidal ideation, hallucinations, paranoia, and confusion. Heme: Denies bruising, bleeding, and enlarged lymph nodes. Neuro:  Denies any headaches, dizziness, paresthesia Endo:  Denies any problems with DM, thyroid, adrenal    Studies:   Dg Chest 2 View  01/25/2015   CLINICAL DATA:  Right sided facial droop. Pt c/o dizziness, blurred vision, facial droop, nausea/vomiting, abdominal pain, belching, discomfort in chest, bloating, and SOB. Hx stroke, CHF, HTN controlled with medication, and a current smoker of less than 1 PPD.  EXAM: CHEST  2 VIEW  COMPARISON:  08/07/2013  FINDINGS: Cardiac silhouette is top-normal size. No mediastinal or hilar masses or evidence of adenopathy.  Mild bilateral interstitial prominence. No lung consolidation or evidence of pulmonary edema. No pleural effusion or pneumothorax.  Bony thorax is demineralized but grossly intact.  IMPRESSION: No acute cardiopulmonary disease.   Electronically Signed   By: Lajean Manes M.D.   On: 01/25/2015 16:47   Ct Head Wo Contrast  01/25/2015   CLINICAL DATA:  Right-sided facial droop and possible mild left-sided facial droop. Nausea.  EXAM: CT HEAD WITHOUT CONTRAST  TECHNIQUE: Contiguous axial images were obtained from the base of the skull through the vertex without intravenous contrast.  COMPARISON:  CT 08/24/2010 and MRI 08/25/2010  FINDINGS: Ventricles, cisterns and other CSF spaces are normal. No evidence of mass, mass effect, shift of midline structures or acute hemorrhage. No evidence of acute infarction. Possible tiny old lacunar infarct over the left lentiform nucleus. Bones and soft tissues are normal.  IMPRESSION: No acute intracranial  findings.   Electronically Signed   By: Marin Olp M.D.   On: 01/25/2015 17:01   Mr Brain Wo Contrast  01/25/2015   CLINICAL DATA:  Right facial droop  EXAM: MRI HEAD WITHOUT CONTRAST  TECHNIQUE: Multiplanar, multiecho pulse sequences of the brain and surrounding structures were obtained without intravenous contrast.  COMPARISON:  CT head 01/25/2015  FINDINGS: Negative for acute infarct. Mild chronic microvascular ischemic change in the white matter. Basal ganglia and brainstem normal. Chronic infarct left body of caudate.  Ventricle size is normal.  Pituitary is normal in size  Negative for hemorrhage or mass.  No edema or mass-effect.  Paranasal sinuses clear.  Normal orbit.  IMPRESSION: Mild chronic microvascular ischemic change in the white matter. No acute abnormality.   Electronically Signed   By: Franchot Gallo M.D.   On: 01/25/2015 19:51   Dg Esophagus  01/21/2015   CLINICAL DATA:  Dysphagia, nausea, vomiting, belching. Prior esophageal dilatation  EXAM: ESOPHOGRAM/BARIUM SWALLOW  TECHNIQUE: Combined double contrast and single contrast examination performed using effervescent crystals, thick barium liquid, and thin barium liquid.  FLUOROSCOPY TIME:  Radiation Exposure Index (as provided by the fluoroscopic device):  If the device does not provide the exposure index:  Fluoroscopy Time:  31 seconds  Number of Acquired Images:  4  COMPARISON:  None.  FINDINGS: No mucosal irregularity or stricture or mass in the mid esophagus. There is narrowing of the distal esophagus at the GE junction. Patient became nauseous and vomited the entirety of the oral contrast plus gastric contents and therefore this exam was halted.  IMPRESSION: Incomplete esophagram. Exam was halted secondary to multiple episodes of emesis.  Potential stricturing of the distal esophagus.  Recommend endoscopy.  These results will be called to the ordering clinician or representative by the Radiologist Assistant, and communication  documented in the PACS or zVision Dashboard.   Electronically Signed   By: Suzy Bouchard M.D.   On: 01/21/2015 14:00   Procedures: EGD 07/03/2014: ENDOSCOPIC IMPRESSION: 1. grade 1 esophagitis 2. Esophageal spasm. Passage of 16 mm Savary dilator 3. Retained esophageal secretions suggestive of esophageal dysmotility RECOMMENDATIONS: 1. Anti-reflux regimen to be follow 2. Await pathology results Page 1 of 2 3. based on barium esophagram patient is having significant gastroesophageal reflux and may be a candidate for Nissen fundoplication, she will follow up with me in the office to see if we can maximize her medical regimen, 4. resume Aggrenox REPEAT EXAM: for  EGD pending biopsy results.  Colonoscopy 05/05/2010 was a normal exam with no polyps or neoplasia. Patient was advised to have a repeat colonoscopy in 10 years.  Physical Exam: BP 124/60 mmHg  Pulse 82  Ht 5' (1.524 m)  Wt 177 lb 2 oz (80.343 kg)  BMI 34.59 kg/m2 General: Pleasant, well developed ,female in no acute distress Head: Normocephalic and atraumatic Eyes:  sclerae anicteric, conjunctiva pink  Ears: Normal auditory acuity Lungs: Clear throughout to auscultation Heart: Regular rate and rhythm Abdomen: Soft, non distended, non-tender. No masses, no hepatomegaly. Normal bowel sounds Musculoskeletal: Symmetrical with no gross deformities  Extremities: No edema  Neurological: Alert oriented x 4, grossly nonfocal Psychological:  Alert and cooperative. Normal mood and affect  Assessment and Recommendations: 64 year old female with severe gastroesophageal reflux presenting with ongoing dysphagia. Patient has had relief of her symptoms with a PPI in the past, but now states she is unable to tolerate PPIs, ranitidine, or famotidine. Antireflux regimen has been reviewed at length with the patient. She will be given a trial of Carafate suspension 10 ML's before meals and at bedtime. We will renew her Zofran 4 mg by mouth every  8 hours when necessary. For her excess gas and bloating she will be given a trial of Xifaxan 550 mg 3 times a day for 14 days for possible small intestinal bacterial overgrowth. Patient states she would like to establish care with a female gastroenterologist and thus will follow-up with Dr. Silverio Decamp in 4-6 weeks. In the meantime she will be scheduled for a barium swallow with out tablet.        Joe Tanney, Vita Barley PA-C 02/06/2015,

## 2015-02-06 NOTE — Patient Instructions (Signed)
We have sent the following medications to your pharmacy for you to pick up at your convenience:Carafate, Zofran and Xifaxan.  Patient advised to avoid spicy, acidic, citrus, chocolate, mints, fruit and fruit juices.  Limit the intake of caffeine, alcohol and Soda.  Don't exercise too soon after eating.  Don't lie down within 3-4 hours of eating.  Elevate the head of your bed.  You have been given a handout on IBS.   You have been scheduled for a Barium Esophogram at [facility] (1st floor of the hospital) on 02/13/15 at 10:00am. Please arrive 15 minutes prior to your appointment for registration. Make certain not to have anything to eat or drink 3 hours prior to your test. If you need to reschedule for any reason, please contact radiology at [phone] to do so. __________________________________________________________________ A barium swallow is an examination that concentrates on views of the esophagus. This tends to be a double contrast exam (barium and two liquids which, when combined, create a gas to distend the wall of the oesophagus) or single contrast (non-ionic iodine based). The study is usually tailored to your symptoms so a good history is essential. Attention is paid during the study to the form, structure and configuration of the esophagus, looking for functional disorders (such as aspiration, dysphagia, achalasia, motility and reflux) EXAMINATION You may be asked to change into a gown, depending on the type of swallow being performed. A radiologist and radiographer will perform the procedure. The radiologist will advise you of the type of contrast selected for your procedure and direct you during the exam. You will be asked to stand, sit or lie in several different positions and to hold a small amount of fluid in your mouth before being asked to swallow while the imaging is performed .In some instances you may be asked to swallow barium coated marshmallows to assess the motility  of a solid food bolus. The exam can be recorded as a digital or video fluoroscopy procedure. POST PROCEDURE It will take 1-2 days for the barium to pass through your system. To facilitate this, it is important, unless otherwise directed, to increase your fluids for the next 24-48hrs and to resume your normal diet.  This test typically takes about 30 minutes to perform. __________________________________________________________________________________  Your follow up appointment with Dr. Silverio Decamp is on 04/04/15 at 9:30am.

## 2015-02-07 DIAGNOSIS — L570 Actinic keratosis: Secondary | ICD-10-CM | POA: Diagnosis not present

## 2015-02-07 NOTE — Progress Notes (Signed)
Reviewed and agree with management. For continued complaints would obtain 48hr bravo pH study Sandy Salaam. Deatra Ina, M.D., [facility]

## 2015-02-10 ENCOUNTER — Telehealth: Payer: Self-pay | Admitting: Physician Assistant

## 2015-02-10 NOTE — Telephone Encounter (Signed)
Patient wanted clarification on what medication needed to be made into a liquid. Informed patient that I sent in Carafate tablets to crush and mix with water to make it a liquid because her insurance would not cover the liquid according to our system. Patient states her pharmacy told her that Carafate tablets were not covered by we were taking care of getting it approved. Informed patient that I have no heard from her pharmacy. Patient's states she will call the pharmacy and find out and will call me back if needed.

## 2015-02-13 ENCOUNTER — Ambulatory Visit (HOSPITAL_COMMUNITY): Payer: Medicare Other

## 2015-02-25 ENCOUNTER — Ambulatory Visit (INDEPENDENT_AMBULATORY_CARE_PROVIDER_SITE_OTHER): Payer: Medicare Other

## 2015-02-25 DIAGNOSIS — Z23 Encounter for immunization: Secondary | ICD-10-CM

## 2015-02-27 ENCOUNTER — Ambulatory Visit (HOSPITAL_COMMUNITY): Payer: Medicare Other

## 2015-02-27 ENCOUNTER — Ambulatory Visit (HOSPITAL_COMMUNITY)
Admission: RE | Admit: 2015-02-27 | Discharge: 2015-02-27 | Disposition: A | Discharge status: Home / Self Care | Payer: Medicare Other | Source: Ambulatory Visit | Attending: Physician Assistant | Admitting: Physician Assistant

## 2015-02-27 DIAGNOSIS — K224 Dyskinesia of esophagus: Secondary | ICD-10-CM | POA: Diagnosis not present

## 2015-02-27 DIAGNOSIS — R112 Nausea with vomiting, unspecified: Secondary | ICD-10-CM | POA: Diagnosis not present

## 2015-02-27 DIAGNOSIS — R1310 Dysphagia, unspecified: Secondary | ICD-10-CM

## 2015-02-27 DIAGNOSIS — R142 Eructation: Secondary | ICD-10-CM | POA: Insufficient documentation

## 2015-02-28 ENCOUNTER — Telehealth: Payer: Self-pay | Admitting: *Deleted

## 2015-02-28 NOTE — Telephone Encounter (Signed)
Oncology Nurse Navigator Documentation  Oncology Nurse Navigator Flowsheets 02/28/2015  Navigator Encounter Type Telephone/Called patient today about smoking cessation.  I left vm message with my name and phone number to call if needed help with quitting smoking   Patient Visit Type -  Treatment Phase -  Barriers/Navigation Needs Education  Education Smoking cessation  Time Spent with Patient 15

## 2015-03-03 ENCOUNTER — Telehealth: Payer: Self-pay | Admitting: Physician Assistant

## 2015-03-03 DIAGNOSIS — R197 Diarrhea, unspecified: Secondary | ICD-10-CM

## 2015-03-03 DIAGNOSIS — K589 Irritable bowel syndrome without diarrhea: Secondary | ICD-10-CM

## 2015-03-03 MED ORDER — METRONIDAZOLE 250 MG PO TABS: 250.0000 mg | ORAL_TABLET | Freq: Three times a day (TID) | ORAL | Status: DC

## 2015-03-03 NOTE — Telephone Encounter (Signed)
Patient states her pharmacy states they have been trying to contact us 3 different times about a PA for Xifaxan. Informed patient that we have not received anything from the pharmacy but I did get a phone call from [facility] this morning and they state they will refax the form to our office. Patient states she has called also about this medication. Informed patient that we do not have any phone messages from her regarding the [place]. Pt states this is "bullshit" and that she spoke with gentleman that told her that we are working on the PA for this medication. I told patient that I apologize but we do not have any messages in our system from when she called.  Told her I would be happy to fill out the form and fax it today. Patient states she wants her damn medicine tomorrow so I need to put a rush on it. Informed her that I will expedite the request and fax it this morning. Pt verbalized understanding. Forms faxed to [facility].

## 2015-03-03 NOTE — Telephone Encounter (Signed)
Patient can try Flagyl 250 mg 3 times a day for 10 days . Please inform her that she cannot have any alcoholic beverages while on Flagyl and for 3 days after completion of Flagyl. Please also obtain a stool fecal elastase.

## 2015-03-03 NOTE — Telephone Encounter (Signed)
Informed patient that we will send in Flagyl to the pharmacy and also to come by our lab tomorrow for the pancreatic fecal elastase. Pt verbalized understanding.

## 2015-03-03 NOTE — Telephone Encounter (Signed)
Patient's insurance company did not approve Xifaxan. Would you like me to send in another medication in it's place?

## 2015-03-05 ENCOUNTER — Other Ambulatory Visit: Payer: Medicare Other

## 2015-03-05 DIAGNOSIS — K589 Irritable bowel syndrome without diarrhea: Secondary | ICD-10-CM | POA: Diagnosis not present

## 2015-03-05 DIAGNOSIS — R197 Diarrhea, unspecified: Secondary | ICD-10-CM | POA: Diagnosis not present

## 2015-03-12 ENCOUNTER — Encounter: Payer: Self-pay | Admitting: Physician Assistant

## 2015-03-12 ENCOUNTER — Ambulatory Visit (INDEPENDENT_AMBULATORY_CARE_PROVIDER_SITE_OTHER): Payer: Medicare Other | Admitting: Physician Assistant

## 2015-03-12 VITALS — BP 118/76 | HR 72 | Temp 97.8°F | Ht 59.5 in | Wt 177.0 lb

## 2015-03-12 DIAGNOSIS — K589 Irritable bowel syndrome without diarrhea: Secondary | ICD-10-CM

## 2015-03-12 DIAGNOSIS — K219 Gastro-esophageal reflux disease without esophagitis: Secondary | ICD-10-CM | POA: Diagnosis not present

## 2015-03-12 DIAGNOSIS — R1310 Dysphagia, unspecified: Secondary | ICD-10-CM

## 2015-03-12 DIAGNOSIS — R1013 Epigastric pain: Secondary | ICD-10-CM | POA: Diagnosis not present

## 2015-03-12 NOTE — Progress Notes (Signed)
Agree with assessment and plan. Given her lack of response to PPIs I would recommend 24 HR pH study OFF PPI to objectively evaluate if she has reflux at all driving this or if she has functional heartburn. Manometry will also be useful to evaluate for dysmotility.

## 2015-03-12 NOTE — Patient Instructions (Addendum)
You have been scheduled for an esophageal manometry at [facility] on 03/17/2015 at 11:00am. Please arrive 15 minutes prior to your procedure for registration. You will need to go to outpatient registration (1st floor of the hospital) first. Make certain to bring your insurance cards as well as a complete list of medications.  Please remember the following:  1) Nothing to eat or drink after 12:00 midnight on the night before your test.  2) Hold all diabetic medications/insulin the morning of the test. You may eat and take your medications after the test.  3) For 3 days prior to your test do not take: Dexilant, Prevacid, Nexium, Protonix, Aciphex, Zegerid, Pantoprazole, Prilosec or omeprazole.  4) For 2 days prior to your test, do not take: Reglan, Tagamet, Zantac, Axid or Pepcid.  5) You MAY use an antacid such as Rolaids or Tums up to 12 hours prior to your test.  It will take at least 2 weeks to receive the results of this test from your physician. ------------------------------------------ ABOUT ESOPHAGEAL MANOMETRY Esophageal manometry (muh-NOM-uh-tree) is a test that gauges how well your esophagus works. Your esophagus is the long, muscular tube that connects your throat to your stomach. Esophageal manometry measures the rhythmic muscle contractions (peristalsis) that occur in your esophagus when you swallow. Esophageal manometry also measures the coordination and force exerted by the muscles of your esophagus.  During esophageal manometry, a thin, flexible tube (catheter) that contains sensors is passed through your nose, down your esophagus and into your stomach. Esophageal manometry can be helpful in diagnosing some mostly uncommon disorders that affect your esophagus.  Why it's done Esophageal manometry is used to evaluate the movement (motility) of food through the esophagus and into the stomach. The test measures how well the circular bands of muscle (sphincters) at the top  and bottom of your esophagus open and close, as well as the pressure, strength and pattern of the wave of esophageal muscle contractions that moves food along.  What you can expect Esophageal manometry is an outpatient procedure done without sedation. Most people tolerate it well. You may be asked to change into a hospital gown before the test starts.  During esophageal manometry  While you are sitting up, a member of your health care team sprays your throat with a numbing medication or puts numbing gel in your nose or both.  A catheter is guided through your nose into your esophagus. The catheter may be sheathed in a water-filled sleeve. It doesn't interfere with your breathing. However, your eyes may water, and you may gag. You may have a slight nosebleed from irritation.  After the catheter is in place, you may be asked to lie on your back on an exam table, or you may be asked to remain seated.  You then swallow small sips of water. As you do, a computer connected to the catheter records the pressure, strength and pattern of your esophageal muscle contractions.  During the test, you'll be asked to breathe slowly and smoothly, remain as still as possible, and swallow only when you're asked to do so.  A member of your health care team may move the catheter down into your stomach while the catheter continues its measurements.  The catheter then is slowly withdrawn. The test usually lasts 20 to 30 minutes.  After esophageal manometry  When your esophageal manometry is complete, you may return to your normal activities  This test typically takes 30-45 minutes to complete.   Avoid dairy and  wheat products  Continue Carafate as needed  Gaviscon 2 tbsp at bedtime  Avoid eating and drinking for 2 hours before retiring  Please follow up with Dr. [name] on 05/13/2015 at 4:00am ________________________________________________________________________________

## 2015-03-12 NOTE — Progress Notes (Signed)
Patient ID: Tiffany Orozco, female   DOB: [birth year], 64 y.o.   MRN: [id number]     History of Present Illness: Tiffany Orozco provided no relief. She then had an abdominal ultrasound which was nonrevealing. She was scheduled for an esophagram but was unable to tolerate the test. She had an EGD on 07/03/2014 that revealed grade 1 esophagitis. She was then seen in the office by Dr. Maurene Capes in March and at that time reported she had increased her pantoprazole to 40 mg twice daily and was markedly improved with no episodes of regurgitation or choking. The decision was made to hold off on a surgical evaluation and Dr. Maurene Capes explained to the patient that she would need a repeat attempt at a barium esophagram before deciding to undergo a Nissen fundoplication. The patient called in August when she was again having problems with nausea and belching. Dr. Maurene Capes again ordered a barium esophagram with tablet but the patient was unable to complete the test. A gastric emptying scan was ordered but the patient could not keep the PEG down, so the test was not completed. She was last seen on 02/06/2015 with complaints of burning in her throat and chest pressure at that time she reported that all PPIs exacerbated her symptoms. She had tried Zantac and famotidine with no relief. She was experiencing dysphagia to solids and liquids. She stated that initially she felt better on pantoprazole but then stopped working and then began to exacerbate her symptoms. She often feels as if she has a lump in her throat. She complains of excess gas bloating and foul smelling flatulence her bowel movements alternate between days of formed stools and loose stools. Her last visit course of Xifaxan was ordered for possible bacterial overgrowth. She had trouble obtaining the medication and is currently on day 6 of the Xifaxan she has significantly less gas and bloating in her stools are regulating. Her main complaints continue to revolve around  chest pressure belching and heartburn. She last tried an esophagram/barium swallow on 02/27/2015 and experienced emesis during the test the oral and pharyngeal phases swallowing appeared normal with no laryngeal penetration or tracheal bronchial aspiration. There was moderate esophageal dysmotility noted. No gastroesophageal reflux could be elicited on this limiting study. There was no hiatal hernia and no esophageal ulcer or mass was detected. There was a borderline smooth narrowing in the lower thoracic esophagus near the esophago-gastric junction.   Past Medical History  Diagnosis Date  . Anxiety   . Depression   . GERD (gastroesophageal reflux disease)   . Hypothyroidism   . Skin cancer of face   . Skin cancer of arm   . Allergy   . COPD (chronic obstructive pulmonary disease) ([place])   . Hyperlipidemia   . Hypertension   . Obesity   . DDD (degenerative disc disease), lumbar     mild  . Stroke ([place]) 07/2010  . Osteopenia   . Heart murmur   . Thrombocytopenia ([place])   . Female rectocele without uterine prolapse     Past Surgical History  Procedure Laterality Date  . Tubal ligation  1975  . Appendectomy    . Foot surgery  03/31/2009    R foot  . Emgs  7/04    atms and wrists neg  . Cholecystectomy  2004  . Foot surgery  2008, 2009, 2010    right foot    Family History  Problem Relation Age of Onset  . Skin cancer Father   .  Hypertension Mother    Social History  Substance Use Topics  . Smoking status: Current Every Day Smoker -- 1.00 packs/day for 31 years    Types: Cigarettes  . Smokeless tobacco: Never Used     Comment: May try when husband finished treatment for prostate cancer  . Alcohol Use: No   Current Outpatient Prescriptions  Medication Sig Dispense Refill  . AGGRENOX 25-200 MG per 12 hr capsule TAKE ONE CAPSULE BY MOUTH TWICE A DAY 180 capsule 1  . amLODipine (NORVASC) 10 MG tablet TAKE 1 TABLET (10 MG TOTAL) BY MOUTH DAILY. 30 tablet 5  . ARMOUR THYROID  90 MG tablet TAKE 1 TABLET (90 MG TOTAL) BY MOUTH DAILY. 90 tablet 0  . Aspirin-Salicylamide-Caffeine (BC HEADACHE POWDER PO) Take 1 packet by mouth every 8 (eight) hours as needed (pain).    . cetirizine (ZYRTEC) 10 MG tablet Take 10 mg by mouth daily.    . Cholecalciferol (VITAMIN D-3) 1000 UNITS CAPS Take 2,000 Units by mouth daily.    Marland Kitchen ezetimibe (ZETIA) 10 MG tablet Take 1 tablet (10 mg total) by mouth daily. 30 tablet 11  . hydrochlorothiazide (HYDRODIURIL) 25 MG tablet Take 1 tablet (25 mg total) by mouth daily. 90 tablet 3  . metroNIDAZOLE (FLAGYL) 250 MG tablet Take 1 tablet (250 mg total) by mouth 3 (three) times daily. 30 tablet 0  . ondansetron (ZOFRAN) 4 MG tablet Take 1 tablet (4 mg total) by mouth every 8 (eight) hours as needed for nausea. 60 tablet 1  . polyethylene glycol powder (GLYCOLAX/MIRALAX) powder 17 grams daily prn constipation. (Patient taking differently: Take 1 Container by mouth daily as needed. ) 500 g 0  . rifaximin (XIFAXAN) 550 MG TABS tablet Take 1 tablet (550 mg total) by mouth 3 (three) times daily. 42 tablet 0  . sucralfate (CARAFATE) 1 G tablet Take 1 tablet (1 g total) by mouth 4 (four) times daily -  with meals and at bedtime. 120 tablet 3   No current facility-administered medications for this visit.   Allergies  Allergen Reactions  . Lisinopril     Shortness of breath.  . Niacin     REACTION: dizziness  . Welchol [Colesevelam Hcl] Other (See Comments)    Diarrhea,Nausea, swelling in face  . Augmentin [Amoxicillin-Pot Clavulanate]     Nauseated. GI upset with taking.  . Codeine     keeps patient awake  . Oxycodone Hcl     Patient states this medication makes her hyper     Review of Systems: Gen: Denies any fever, chills, sweats, anorexia, fatigue, weakness, malaise, weight loss, and sleep disorder CV: Denies chest pain, angina, palpitations, syncope, orthopnea, PND, peripheral edema, and claudication. Resp: Denies dyspnea at rest, dyspnea  with exercise, cough, sputum, wheezing, coughing up blood, and pleurisy. GI: Denies vomiting blood, jaundice, and fecal incontinence.   Denies dysphagia or odynophagia. GU : Denies urinary burning, blood in urine, urinary frequency, urinary hesitancy, nocturnal urination, and urinary incontinence. MS: Denies joint pain, limitation of movement, and swelling, stiffness, low back pain, extremity pain. Denies muscle weakness, cramps, atrophy.  Derm: Denies rash, itching, dry skin, hives, moles, warts, or unhealing ulcers.  Psych: Denies depression, anxiety, memory loss, suicidal ideation, hallucinations, paranoia, and confusion. Heme: Denies bruising, bleeding, and enlarged lymph nodes. Neuro:  Denies any headaches, dizziness, paresthesia Endo:  Denies any problems with DM, thyroid, adrenal    Studies:   Dg Esophagus  02/27/2015  CLINICAL DATA:  Postprandial nausea and vomiting  and belching. Reported history of dysphagia and esophageal dilation. EXAM: ESOPHOGRAM/BARIUM SWALLOW TECHNIQUE: Single contrast examination was performed using  thin barium. FLUOROSCOPY TIME:  Fluoroscopy Time:  4 minutes 16 seconds. Number of Acquired Images:  1 (remaining images are screen captures) COMPARISON:  01/21/2015 esophagram. FINDINGS: The study was significantly limited by patient emesis. The oral and pharyngeal phase of swallowing appeared normal with no laryngeal penetration or tracheobronchial aspiration. There is moderate esophageal dysmotility, characterized by intermittent moderate weakening of primary peristalsis in the mid to lower thoracic esophagus. No gastroesophageal reflux could be elicited on this limited study. There is no hiatal hernia. No esophageal ulcer or mass are detected. There is borderline smooth narrowing in the lower thoracic esophagus near the esophagogastric junction. The 13 mm barium tablet was delayed in the lower thoracic esophagus for approximately 10 seconds before passing into the  stomach. These findings are suggestive of a minimal peptic stricture in the lower thoracic esophagus near the esophagogastric junction. IMPRESSION: 1. Limited study due to patient emesis. 2. Moderate esophageal dysmotility. Although no gastroesophageal reflux was elicited on this study, the pattern of dysmotility on this study is characteristic of chronic reflux related dysmotility. No hiatal hernia. 3. Findings suggestive of a minimal benign peptic stricture in the lower thoracic esophagus near the esophagogastric junction, which may not be clinically significant, see comments. No esophageal ulcer or mass detected. 4. Normal oral and pharyngeal phase of swallowing, with no laryngeal penetration or tracheobronchial aspiration. Electronically Signed   By: Ilona Sorrel M.D.   On: 02/27/2015 10:41     Physical Exam: BP 118/76 mmHg  Pulse 72  Temp(Src) 97.8 F (36.6 C) (Oral)  Ht 4' 11.5" (1.511 m)  Wt 177 lb (80.287 kg)  BMI 35.17 kg/m2 General: Pleasant, well developed , anxious Caucasian female in no acute distress, who continuously belches and passes flatus throughout the entire visit stating she cannot help it Head: Normocephalic and atraumatic Eyes:  sclerae anicteric, conjunctiva pink  Ears: Normal auditory acuity Lungs: Clear throughout to auscultation Heart: Regular rate and rhythm Abdomen: Soft, non distended, non-tender. No masses, no hepatomegaly. Normal bowel sounds Musculoskeletal: Symmetrical with no gross deformities  Extremities: No edema  Neurological: Alert oriented x 4, grossly nonfocal Psychological:  Alert and cooperative. Normal mood and affect  Assessment and Recommendations:  #1. IBS. This is likely the etiology of her gas and bloating. She has noted some improvement on Xifaxan and we'll continue Xifaxan 550 mg 3 times daily for 8 more days. She will also try to limit dairy and wheat products for the next several weeks to see if this helps diminish her gas as  well.  #2. GERD. Patient continues to complain of intermittent dysphagia with constant burning in her throat and belching. She is insistent that PPIs and H2 blockers exacerbate her symptoms. Recent esophagram reveals some dysmotility and a minimal benign peptic stricture in the lower thoracic esophagus. A considerable amount of time was spent with the patient explaining an antireflux regimen, and explaining how her symptoms may be due to too hypersensitive esophagus and not GERD.. We have also explained that our options are limited at this time as she has not been able to tolerate much testing. I explained to her that an esophageal manometry as well as pH test would be very important to help determine the cause of her symptoms. The other option is to proceed with a repeat endoscopy. The patient states she prefers to try the manometry and pH test first.  If she is not able to tolerate these, she may be a candidate for a trial of baclofen 10 mg 3 times a day. We have also discussed perhaps a trial of low-dose nortriptyline or low dose SSRI, however the patient's prefers to wait until the results of the manometry and pH test are available. She previously had a scheduled follow-up in November but has canceled this and will follow up with Dr. [name] in 2 months to establish care.     Vernica Wachtel, Vita Barley PA-C 03/12/2015,

## 2015-03-13 LAB — PANCREATIC ELASTASE, FECAL: Pancreatic Elastase-1, Stool: >500 mcg/g

## 2015-03-17 ENCOUNTER — Ambulatory Visit (HOSPITAL_COMMUNITY)
Admission: RE | Admit: 2015-03-17 | Discharge: 2015-03-17 | Disposition: A | Discharge status: Home / Self Care | Payer: Medicare Other | Source: Ambulatory Visit | Attending: Gastroenterology | Admitting: Gastroenterology

## 2015-03-17 ENCOUNTER — Ambulatory Visit (HOSPITAL_COMMUNITY): Admission: AD | Admit: 2015-03-17 | Payer: Self-pay | Admitting: Gastroenterology

## 2015-03-17 ENCOUNTER — Encounter (HOSPITAL_COMMUNITY)
Admission: RE | Disposition: A | Discharge status: Home / Self Care | Payer: Self-pay | Source: Ambulatory Visit | Attending: Internal Medicine

## 2015-03-17 ENCOUNTER — Encounter (HOSPITAL_COMMUNITY): Admission: AD | Payer: Self-pay

## 2015-03-17 DIAGNOSIS — R142 Eructation: Secondary | ICD-10-CM | POA: Diagnosis not present

## 2015-03-17 DIAGNOSIS — R12 Heartburn: Secondary | ICD-10-CM | POA: Diagnosis not present

## 2015-03-17 DIAGNOSIS — R1310 Dysphagia, unspecified: Secondary | ICD-10-CM

## 2015-03-17 DIAGNOSIS — K219 Gastro-esophageal reflux disease without esophagitis: Secondary | ICD-10-CM

## 2015-03-17 DIAGNOSIS — R110 Nausea: Secondary | ICD-10-CM | POA: Insufficient documentation

## 2015-03-17 HISTORY — PX: ESOPHAGEAL MANOMETRY: SHX5429

## 2015-03-17 SURGERY — MANOMETRY, ESOPHAGUS

## 2015-03-17 SURGERY — MONITORING, ESOPHAGEAL PH, 24 HOUR

## 2015-03-17 MED ORDER — LIDOCAINE VISCOUS 2 % MT SOLN
OROMUCOSAL | Status: AC
  Filled 2015-03-17: qty 15

## 2015-03-17 SURGICAL SUPPLY — 2 items
FACESHIELD LNG OPTICON STERILE (SAFETY) IMPLANT
GLOVE BIO SURGEON STRL SZ8 (GLOVE) ×6 IMPLANT

## 2015-03-18 ENCOUNTER — Encounter: Payer: Self-pay | Admitting: Gastroenterology

## 2015-03-18 ENCOUNTER — Encounter (HOSPITAL_COMMUNITY): Payer: Self-pay | Admitting: Gastroenterology

## 2015-03-21 ENCOUNTER — Ambulatory Visit (HOSPITAL_COMMUNITY): Payer: Medicare Other

## 2015-03-21 DIAGNOSIS — L570 Actinic keratosis: Secondary | ICD-10-CM | POA: Diagnosis not present

## 2015-04-04 ENCOUNTER — Ambulatory Visit: Payer: Medicare Other | Admitting: Gastroenterology

## 2015-04-11 ENCOUNTER — Telehealth: Payer: Self-pay | Admitting: *Deleted

## 2015-04-11 NOTE — Telephone Encounter (Signed)
Tiffany Orozco, Can you please contact this patient. Her results are consistent with acid reflux causing her symptoms. Her manometry shows an abnormality which I would like to discuss further in clinic with her. I think she warrants a PPI however she reports intolerance of these in the past. She can take some gaviscon in the interim until she sees me. Thanks Dr. [name] Patient notified of recommendations. She is scheduled for OV on 05/15/15.

## 2015-04-14 ENCOUNTER — Telehealth: Payer: Self-pay | Admitting: Family Medicine

## 2015-04-14 DIAGNOSIS — Z1159 Encounter for screening for other viral diseases: Secondary | ICD-10-CM

## 2015-04-14 NOTE — Telephone Encounter (Signed)
Pt would like to know if she can possibly have the blood draw for hepatitis while she is here tomorrow for her cholesterol labs. CB # [id number]. Thank you.

## 2015-04-15 ENCOUNTER — Other Ambulatory Visit (INDEPENDENT_AMBULATORY_CARE_PROVIDER_SITE_OTHER): Payer: Medicare Other

## 2015-04-15 ENCOUNTER — Telehealth: Payer: Self-pay | Admitting: Family Medicine

## 2015-04-15 ENCOUNTER — Other Ambulatory Visit: Payer: Self-pay | Admitting: Family Medicine

## 2015-04-15 DIAGNOSIS — D75839 Thrombocytosis, unspecified: Secondary | ICD-10-CM

## 2015-04-15 DIAGNOSIS — D473 Essential (hemorrhagic) thrombocythemia: Secondary | ICD-10-CM

## 2015-04-15 DIAGNOSIS — E7800 Pure hypercholesterolemia, unspecified: Secondary | ICD-10-CM

## 2015-04-15 DIAGNOSIS — E038 Other specified hypothyroidism: Secondary | ICD-10-CM

## 2015-04-15 DIAGNOSIS — Z1159 Encounter for screening for other viral diseases: Secondary | ICD-10-CM | POA: Diagnosis not present

## 2015-04-15 DIAGNOSIS — E039 Hypothyroidism, unspecified: Secondary | ICD-10-CM | POA: Diagnosis not present

## 2015-04-15 DIAGNOSIS — M8580 Other specified disorders of bone density and structure, unspecified site: Secondary | ICD-10-CM

## 2015-04-15 LAB — HEPATITIS C ANTIBODY: HCV Ab: NEGATIVE

## 2015-04-15 LAB — COMPREHENSIVE METABOLIC PANEL
ALT: 24 U/L (ref 0–35)
AST: 19 U/L (ref 0–37)
Albumin: 4.1 g/dL (ref 3.5–5.2)
Alkaline Phosphatase: 104 U/L (ref 39–117)
BUN: 11 mg/dL (ref 6–23)
CO2: 27 mEq/L (ref 19–32)
Calcium: 9.2 mg/dL (ref 8.4–10.5)
Chloride: 103 mEq/L (ref 96–112)
Creatinine, Ser: 0.72 mg/dL (ref 0.40–1.20)
GFR: 86 mL/min (ref >60.00)
Glucose, Bld: 109 mg/dL — ABNORMAL HIGH (ref 70–99)
Potassium: 3.9 mEq/L (ref 3.5–5.1)
Sodium: 138 mEq/L (ref 135–145)
Total Bilirubin: 0.4 mg/dL (ref 0.2–1.2)
Total Protein: 7.3 g/dL (ref 6.0–8.3)

## 2015-04-15 LAB — LIPID PANEL
Cholesterol: 164 mg/dL (ref 0–200)
HDL: 38.6 mg/dL — ABNORMAL LOW (ref >39.00)
LDL Cholesterol: 91 mg/dL (ref 0–99)
NonHDL: 125.31
Total CHOL/HDL Ratio: 4
Triglycerides: 171 mg/dL — ABNORMAL HIGH (ref 0.0–149.0)
VLDL: 34.2 mg/dL (ref 0.0–40.0)

## 2015-04-15 LAB — T3, FREE: T3, Free: 3.6 pg/mL (ref 2.3–4.2)

## 2015-04-15 LAB — T4, FREE: Free T4: 0.63 ng/dL (ref 0.60–1.60)

## 2015-04-15 LAB — TSH: TSH: 0.66 u[IU]/mL (ref 0.35–4.50)

## 2015-04-15 NOTE — Telephone Encounter (Signed)
Absolutely.

## 2015-04-15 NOTE — Telephone Encounter (Signed)
-----   Message from Ellamae Sia sent at 04/08/2015  2:35 PM EST ----- Regarding: lab orders for Tuesday,11.15.16  Lab orders for a 3 month follow up appt.

## 2015-04-15 NOTE — Telephone Encounter (Signed)
-----   Message from Ellamae Sia sent at 04/15/2015  8:11 AM EST ----- Regarding: add a lab test? Pt wants to add Thyroid tests due to change in her medication

## 2015-04-15 NOTE — Addendum Note (Signed)
Addended by: Ellamae Sia on: 04/15/2015 02:17 PM   Modules accepted: Orders

## 2015-04-15 NOTE — Telephone Encounter (Signed)
Added labs

## 2015-04-16 ENCOUNTER — Encounter: Payer: Self-pay | Admitting: *Deleted

## 2015-04-19 ENCOUNTER — Other Ambulatory Visit: Payer: Self-pay | Admitting: Family Medicine

## 2015-04-19 NOTE — Telephone Encounter (Signed)
Last office visit 01/17/2015.  Not on current medication list.  Refill?

## 2015-04-22 ENCOUNTER — Encounter: Payer: Self-pay | Admitting: Family Medicine

## 2015-04-22 ENCOUNTER — Ambulatory Visit (INDEPENDENT_AMBULATORY_CARE_PROVIDER_SITE_OTHER): Payer: Medicare Other | Admitting: Family Medicine

## 2015-04-22 ENCOUNTER — Encounter (INDEPENDENT_AMBULATORY_CARE_PROVIDER_SITE_OTHER): Payer: Self-pay

## 2015-04-22 VITALS — BP 149/86 | HR 92 | Temp 98.3°F | Ht 59.5 in | Wt 169.8 lb

## 2015-04-22 DIAGNOSIS — E7800 Pure hypercholesterolemia, unspecified: Secondary | ICD-10-CM | POA: Diagnosis not present

## 2015-04-22 DIAGNOSIS — F411 Generalized anxiety disorder: Secondary | ICD-10-CM

## 2015-04-22 DIAGNOSIS — E039 Hypothyroidism, unspecified: Secondary | ICD-10-CM

## 2015-04-22 DIAGNOSIS — R110 Nausea: Secondary | ICD-10-CM

## 2015-04-22 MED ORDER — SERTRALINE HCL 25 MG PO TABS: 25.0000 mg | ORAL_TABLET | Freq: Every day | ORAL | Status: DC

## 2015-04-22 MED ORDER — ONDANSETRON HCL 4 MG PO TABS: 4.0000 mg | ORAL_TABLET | Freq: Three times a day (TID) | ORAL | Status: DC | PRN

## 2015-04-22 NOTE — Patient Instructions (Addendum)
Continue zetia. If feeling up to it consider adding red yeast rice 600 mg 2 cap twice daily for cholesterol control  Continue the current dose of armour thyroid.  Start sertraline 25 mg at bedtime for stomach and anxiety issues. If tolerating after 3-4 weeks but not improving can increase to 50 mg daily. Follow up mood in 1 month.

## 2015-04-22 NOTE — Assessment & Plan Note (Signed)
?   contributer to nausea, IBS symtpoms.  AS recommended by GI at  Last OV.. Will start SSRI.

## 2015-04-22 NOTE — Assessment & Plan Note (Signed)
Improving control, not yet at goal on zetia. Consider adding red yeast rice if able to tolerate it.

## 2015-04-22 NOTE — Assessment & Plan Note (Signed)
Refilled zofran

## 2015-04-22 NOTE — Progress Notes (Signed)
Pre visit review using our clinic review tool, if applicable. No additional management support is needed unless otherwise documented below in the visit note. 

## 2015-04-22 NOTE — Assessment & Plan Note (Signed)
Stable control on current dose. 

## 2015-04-22 NOTE — Progress Notes (Signed)
Subjective:    Patient ID: Tiffany Orozco, female    DOB: 29-Aug-1950, 65 y.o.   MRN: [id number]  HPI  64 year old female presents for follow up.  Still with nausea chronically. In process with eval with GI. Only thing that helps is nausea med and some improvement with Xifaxan x 14 days for IBS D. She has not been able to tolerate much testing.  Symptoms get worse when she thinks about food .  Has appt in 12 with Dr. [name].   She has been overwhlemed with husband poor health.  She is anxious, trying to keep positive outlook.  HX of depression and anxiety. She is open to restarting mood med as she was on in past ( venlaxfaxine but will try what was suggested by GI.   She has changed back to armour thyroid.  Lab Results  Component Value Date   TSH 0.66 04/15/2015      Hypertension:  Well controlled on amlodipine and HCTZ. BP Readings from Last 3 Encounters:  04/22/15 149/86  03/12/15 118/76  02/06/15 124/60   Elevated Cholesterol: HDL low, but LDL IMPROVED BUT NOTAT goal <70  On ZETIA. SE to simvastatin.  HX of CVA on aggrenox.  Lab Results  Component Value Date   CHOL 164 04/15/2015   HDL 38.60* 04/15/2015   LDLCALC 91 04/15/2015   LDLDIRECT 135.0 01/15/2015   TRIG 171.0* 04/15/2015   CHOLHDL 4 04/15/2015   Using medications without problems:None  Muscle aches: None  Diet compliance: poor appetite, has to avoid a lot things due to stomach irritation ( greasy, dairy, sweet things) Exercise: None due to hip pain                      Review of Systems  Constitutional: Negative for fever and fatigue.  HENT: Negative for ear pain.   Eyes: Negative for pain.  Respiratory: Negative for chest tightness and shortness of breath.   Cardiovascular: Negative for chest pain, palpitations and leg swelling.  Gastrointestinal: Positive for nausea, diarrhea and abdominal distention. Negative for abdominal pain.  Genitourinary: Negative for dysuria.      Objective:   Physical Exam  Constitutional: Vital signs are normal. She appears well-developed and well-nourished. She is cooperative.  Non-toxic appearance. She does not appear ill. No distress.  Constant burping  HENT:  Head: Normocephalic.  Right Ear: Hearing, tympanic membrane, external ear and ear canal normal. Tympanic membrane is not erythematous, not retracted and not bulging.  Left Ear: Hearing, tympanic membrane, external ear and ear canal normal. Tympanic membrane is not erythematous, not retracted and not bulging.  Nose: Nose normal. No mucosal edema or rhinorrhea. Right sinus exhibits no maxillary sinus tenderness and no frontal sinus tenderness. Left sinus exhibits no maxillary sinus tenderness and no frontal sinus tenderness.  Mouth/Throat: Uvula is midline, oropharynx is clear and moist and mucous membranes are normal.  Eyes: Conjunctivae, EOM and lids are normal. Pupils are equal, round, and reactive to light. Lids are everted and swept, no foreign bodies found.  Neck: Trachea normal and normal range of motion. Neck supple. Carotid bruit is not present. No thyroid mass and no thyromegaly present.  Cardiovascular: Normal rate, regular rhythm, S1 normal, S2 normal, normal heart sounds, intact distal pulses and normal pulses.  Exam reveals no gallop and no friction rub.   No murmur heard. Pulmonary/Chest: Effort normal and breath sounds normal. No tachypnea. No respiratory distress. She has no decreased breath sounds. She  has no wheezes. She has no rhonchi. She has no rales.  Abdominal: Soft. Normal appearance and bowel sounds are normal. She exhibits no distension, no fluid wave, no abdominal bruit and no mass. There is no hepatosplenomegaly. There is tenderness in the epigastric area. There is no rebound, no guarding and no CVA tenderness. No hernia.  Lymphadenopathy:    She has no cervical adenopathy.    She has no axillary adenopathy.  Neurological: She is alert. She has normal  strength. No cranial nerve deficit or sensory deficit.  Skin: Skin is warm, dry and intact. No rash noted.  Psychiatric: Her speech is normal and behavior is normal. Judgment and thought content normal. Her mood appears not anxious. Cognition and memory are normal. She does not exhibit a depressed mood.          Assessment & Plan:

## 2015-05-01 ENCOUNTER — Other Ambulatory Visit: Payer: Self-pay | Admitting: Family Medicine

## 2015-05-01 DIAGNOSIS — L570 Actinic keratosis: Secondary | ICD-10-CM | POA: Diagnosis not present

## 2015-05-01 DIAGNOSIS — L820 Inflamed seborrheic keratosis: Secondary | ICD-10-CM | POA: Diagnosis not present

## 2015-05-01 DIAGNOSIS — L814 Other melanin hyperpigmentation: Secondary | ICD-10-CM | POA: Diagnosis not present

## 2015-05-01 DIAGNOSIS — I788 Other diseases of capillaries: Secondary | ICD-10-CM | POA: Diagnosis not present

## 2015-05-13 ENCOUNTER — Ambulatory Visit: Payer: Medicare Other | Admitting: Gastroenterology

## 2015-05-14 ENCOUNTER — Other Ambulatory Visit: Payer: Self-pay | Admitting: *Deleted

## 2015-05-14 MED ORDER — ASPIRIN-DIPYRIDAMOLE ER 25-200 MG PO CP12: 1.0000 | ORAL_CAPSULE | Freq: Two times a day (BID) | ORAL | Status: DC

## 2015-05-15 ENCOUNTER — Ambulatory Visit (INDEPENDENT_AMBULATORY_CARE_PROVIDER_SITE_OTHER): Payer: Medicare Other | Admitting: Gastroenterology

## 2015-05-15 ENCOUNTER — Encounter: Payer: Self-pay | Admitting: Gastroenterology

## 2015-05-15 VITALS — BP 148/72 | HR 84 | Ht 59.5 in | Wt 173.4 lb

## 2015-05-15 DIAGNOSIS — R1310 Dysphagia, unspecified: Secondary | ICD-10-CM | POA: Diagnosis not present

## 2015-05-15 DIAGNOSIS — K589 Irritable bowel syndrome without diarrhea: Secondary | ICD-10-CM | POA: Diagnosis not present

## 2015-05-15 DIAGNOSIS — K219 Gastro-esophageal reflux disease without esophagitis: Secondary | ICD-10-CM

## 2015-05-15 MED ORDER — RABEPRAZOLE SODIUM 20 MG PO TBEC: 20.0000 mg | DELAYED_RELEASE_TABLET | Freq: Two times a day (BID) | ORAL | Status: DC

## 2015-05-15 MED ORDER — RIFAXIMIN 550 MG PO TABS: 550.0000 mg | ORAL_TABLET | Freq: Three times a day (TID) | ORAL | Status: DC

## 2015-05-15 NOTE — Patient Instructions (Signed)
We have sent medications to your pharmacy for you to pick up at your convenience   

## 2015-05-15 NOTE — Progress Notes (Addendum)
HPI :  64 y/o female previously followed by Dr. [name], new to me. She has a history of GERD and dysphagia, and IBS with significant abdominal bloating. She is here for follow up.   She endorses a history of heartburn. She is taking gaviscon PRN for this, but she thinks it makes her belch. She has been on a variety of PPIs - protonix, omeprazole, nexium, prevacid, and she has also been on zantac as well as other H2 blockers. She does not think any of these have stopped her symptoms in that they make her "belching" worse. She thinks they all cause her belching to be worse. She also thinks the heartburn is worse and she is nauseous on these regimens. She recently underwent a 24 HR pH impedance testing with a Demeester score of 37.1 with a strong positive symptom index.   She reports a history of dysphagia remotely but she has not had much problems with this recently. She had an EGD on 07/03/14 showing grade A esophagitis and had an empiric dilation done to which she had some benefit. She denies much problems with her swallowing at present time. 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   Otherwise in regards to her IBS, the patient had been given a course of rifaximin for 2 weeks for her gas and bloating. She reports she had a few months worth of benefit while on it. She has some alternating loose stools / constipation over time, with cramping in her abdomen. She has some improvement in her symptoms following a bowel movements. She reports she takes BC powder 2 times per day, on average, and takes aleve as well.   EGD 07/2014 - grade I esophagitis, dilation with SAvory dilator to 59m Colonoscopy - 2011 - normal 24 HR pH impedance testing - Demeester of 37.1 with positive symptom index Manometry - EGJ outflow obstruction, mildly elevated IRP  Past Medical History  Diagnosis Date  . Anxiety   . Depression   . GERD (gastroesophageal reflux  disease)   . Hypothyroidism   . Skin cancer of face   . Skin cancer of arm   . Allergy   . COPD (chronic obstructive pulmonary disease) ([place])   . Hyperlipidemia   . Hypertension   . Obesity   . DDD (degenerative disc disease), lumbar     mild  . Stroke ([place]) 07/2010  . Osteopenia   . Heart murmur   . Thrombocytopenia ([place])   . Female rectocele without uterine prolapse      Past Surgical History  Procedure Laterality Date  . Tubal ligation  1975  . Appendectomy    . Foot surgery  03/31/2009    R foot  . Emgs  7/04    atms and wrists neg  . Cholecystectomy  2004  . Foot surgery  2008, 2009, 2010    right foot   . Esophageal manometry N/A 03/17/2015    Procedure: ESOPHAGEAL MANOMETRY (EM);  Surgeon: Manus Gunning, MD;  Location: [facility];  Service: Gastroenterology;  Laterality: N/A;   Family History  Problem Relation Age of Onset  . Skin cancer Father   . Hypertension Mother    Social History  Substance Use Topics  . Smoking status: Current Every Day Smoker -- 1.00 packs/day for 31 years    Types: Cigarettes  . Smokeless tobacco: Never Used     Comment: May try when husband finished treatment for prostate cancer  .  Alcohol Use: No   Current Outpatient Prescriptions  Medication Sig Dispense Refill  . amLODipine (NORVASC) 10 MG tablet TAKE 1 TABLET (10 MG TOTAL) BY MOUTH DAILY. 30 tablet 5  . ARMOUR THYROID 90 MG tablet TAKE 1 TABLET (90 MG TOTAL) BY MOUTH DAILY. 90 tablet 3  . Aspirin-Salicylamide-Caffeine (BC HEADACHE POWDER PO) Take 1 packet by mouth every 8 (eight) hours as needed (pain).    . cetirizine (ZYRTEC) 10 MG tablet Take 10 mg by mouth daily.    . Cholecalciferol (VITAMIN D-3) 1000 UNITS CAPS Take 2,000 Units by mouth daily.    Marland Kitchen dipyridamole-aspirin (AGGRENOX) 200-25 MG 12hr capsule Take 1 capsule by mouth 2 (two) times daily. 180 capsule 1  . ezetimibe (ZETIA) 10 MG tablet Take 1 tablet (10 mg total) by mouth daily. 30 tablet 11  .  hydrochlorothiazide (HYDRODIURIL) 25 MG tablet Take 1 tablet (25 mg total) by mouth daily. 90 tablet 3  . ondansetron (ZOFRAN) 4 MG tablet Take 1 tablet (4 mg total) by mouth every 8 (eight) hours as needed for nausea. 60 tablet 1  . polyethylene glycol powder (GLYCOLAX/MIRALAX) powder 17 grams daily prn constipation. (Patient taking differently: Take 1 Container by mouth daily as needed. ) 500 g 0  . sertraline (ZOLOFT) 25 MG tablet Take 1 tablet (25 mg total) by mouth daily. 90 tablet 1   No current facility-administered medications for this visit.   Allergies  Allergen Reactions  . Lisinopril     Shortness of breath.  . Niacin     REACTION: dizziness  . Welchol [Colesevelam Hcl] Other (See Comments)    Diarrhea,Nausea, swelling in face  . Augmentin [Amoxicillin-Pot Clavulanate]     Nauseated. GI upset with taking.  . Codeine     keeps patient awake  . Oxycodone Hcl     Patient states this medication makes her hyper     Review of Systems: All systems reviewed and negative except where noted in HPI.   Lab Results  Component Value Date   WBC 10.5 01/25/2015   HGB 15.3* 01/25/2015   HCT 45.0 01/25/2015   MCV 84.8 01/25/2015   PLT 382 01/25/2015    Lab Results  Component Value Date   CREATININE 0.72 04/15/2015   BUN 11 04/15/2015   NA 138 04/15/2015   K 3.9 04/15/2015   CL 103 04/15/2015   CO2 27 04/15/2015    Lab Results  Component Value Date   ALT 24 04/15/2015   AST 19 04/15/2015   ALKPHOS 104 04/15/2015   BILITOT 0.4 04/15/2015     Physical Exam: BP 148/72 mmHg  Pulse 84  Ht 4' 11.5" (1.511 m)  Wt 173 lb 6.4 oz (78.654 kg)  BMI 34.45 kg/m2 Constitutional: Pleasant,well-developed, female in no acute distress. HEENT: Normocephalic and atraumatic. Conjunctivae are normal. No scleral icterus. Neck supple.  Cardiovascular: Normal rate, regular rhythm.  Pulmonary/chest: Effort normal and breath sounds normal. No wheezing, rales or rhonchi. Abdominal:  Soft, nondistended, nontender. Bowel sounds active throughout. There are no masses palpable. No hepatomegaly. Extremities: no edema Lymphadenopathy: No cervical adenopathy noted. Neurological: Alert and oriented to person place and time. Skin: Skin is warm and dry. No rashes noted. Psychiatric: Normal mood and affect. Behavior is normal.   ASSESSMENT AND PLAN: 64 y/o female with longstanding GERD, dysphagia, and IBS, here for follow up. Issues as outlined below.   GERD - chronic symptoms with poor response to all PPIs to date. We performed a 24 HR pH  impedance study given her poor response to PPIs and H2blockers, and she had a high Demeester score and had a strong symptom index. Thus, she clearly has pathologic acid reflux, but unclear why she does not respond to PPIs historically. Gaviscon does help. We will try her on aciphex 40mg  q day, as she has not had exposure to this yet and if it helps. She can continue gaviscon PRN. I discussed with her there is a difference between belching and heartburn, and she may be interpreting belching as "worsening of reflux". I suspect she has some supragastric belching and I discussed what this was with her. I want her to focus on heartburn symptoms, her index reflux symptom, and see if this is improved with the aciphex. She can follow up if no improvement.   Dysphagia - Prior EGD reportedly normal but she had some benefit with empiric dilation. Manometry was performed showing EGJ outflow obstruction but no evidence of achalasia. Her EGD showed no pathology at the GEJ or cardia to explain this. Given she has no dysphagia at present time, I would monitor for now, as treating outflow obstruction would likely make her reflux worse. If dysphagia recurs may consider a repeat EGD to ensure the LES and cardia is normal.   IBS - good response to rifaximin a few months ago but benefit has since wained. Will repeat another 2 weeks course and provide counseling on low fodmop  diet to see if this helps as well to reduce her bloating. She has tested negative for celiac and prior colonoscopy unremarkable. She can follow up as needed for this issue.   [place] Cellar, MD [facility] Pager [phone]     Addendum: I asked the patient to stop NSAIDs, specifically BC powder which she takes twice daily, as it could be contributing to some of her symptoms. She verbalized understanding.

## 2015-05-16 ENCOUNTER — Other Ambulatory Visit: Payer: Self-pay

## 2015-05-16 ENCOUNTER — Telehealth: Payer: Self-pay | Admitting: Gastroenterology

## 2015-05-16 NOTE — Telephone Encounter (Signed)
Information was sent.

## 2015-05-19 ENCOUNTER — Other Ambulatory Visit: Payer: Self-pay | Admitting: Physician Assistant

## 2015-05-19 ENCOUNTER — Telehealth: Payer: Self-pay | Admitting: Gastroenterology

## 2015-05-19 NOTE — Telephone Encounter (Signed)
Called pt and informed her that we should receive her approval or denial letter soon and that I will contact her with information on alternatives if Xifaxan is denied.

## 2015-05-30 ENCOUNTER — Ambulatory Visit (INDEPENDENT_AMBULATORY_CARE_PROVIDER_SITE_OTHER): Payer: Medicare Other | Admitting: Family Medicine

## 2015-05-30 ENCOUNTER — Encounter: Payer: Self-pay | Admitting: Family Medicine

## 2015-05-30 VITALS — BP 100/60 | HR 76 | Temp 98.2°F | Ht 59.5 in | Wt 170.0 lb

## 2015-05-30 DIAGNOSIS — F411 Generalized anxiety disorder: Secondary | ICD-10-CM | POA: Diagnosis not present

## 2015-05-30 MED ORDER — SERTRALINE HCL 50 MG PO TABS: 25.0000 mg | ORAL_TABLET | Freq: Every day | ORAL | Status: DC

## 2015-05-30 MED ORDER — HYDROCHLOROTHIAZIDE 25 MG PO TABS: 25.0000 mg | ORAL_TABLET | Freq: Every day | ORAL | Status: DC

## 2015-05-30 NOTE — Progress Notes (Signed)
   Subjective:    Patient ID: Tiffany Orozco, female    DOB: 12/29/1950, 64 y.o.   MRN: [id number]  HPI   64 year old female presents for follow up GAD. At last OV 1 month ago she was started on sertraline 25 mg daily.   She has noted her nausea is better overall.She feels more calm, less irritable.  Smells do not bother her as badly. Bloating is better as well.  Can now got to grocery store.  He husband is still ill.. So still under a lot of stress.  She is trying to think positively.   No SE to the sertraline.  Wt Readings from Last 3 Encounters:  05/30/15 170 lb (77.111 kg)  05/15/15 173 lb 6.4 oz (78.654 kg)  04/22/15 169 lb 12 oz (76.998 kg)     Social History /Family History/Past Medical History reviewed and updated if needed.   Review of Systems  Constitutional: Negative for fever and fatigue.  HENT: Negative for ear pain.   Eyes: Negative for pain.  Respiratory: Negative for chest tightness and shortness of breath.   Cardiovascular: Negative for chest pain, palpitations and leg swelling.  Gastrointestinal: Negative for abdominal pain.  Genitourinary: Negative for dysuria.       Objective:   Physical Exam  Constitutional: Vital signs are normal. She appears well-developed and well-nourished. She is cooperative.  Non-toxic appearance. She does not appear ill. No distress.  HENT:  Head: Normocephalic.  Right Ear: Hearing, tympanic membrane, external ear and ear canal normal. Tympanic membrane is not erythematous, not retracted and not bulging.  Left Ear: Hearing, tympanic membrane, external ear and ear canal normal. Tympanic membrane is not erythematous, not retracted and not bulging.  Nose: No mucosal edema or rhinorrhea. Right sinus exhibits no maxillary sinus tenderness and no frontal sinus tenderness. Left sinus exhibits no maxillary sinus tenderness and no frontal sinus tenderness.  Mouth/Throat: Uvula is midline, oropharynx is clear and moist and mucous  membranes are normal.  Eyes: Conjunctivae, EOM and lids are normal. Pupils are equal, round, and reactive to light. Lids are everted and swept, no foreign bodies found.  Neck: Trachea normal and normal range of motion. Neck supple. Carotid bruit is not present. No thyroid mass and no thyromegaly present.  Cardiovascular: Normal rate, regular rhythm, S1 normal, S2 normal, normal heart sounds, intact distal pulses and normal pulses.  Exam reveals no gallop and no friction rub.   No murmur heard. Pulmonary/Chest: Effort normal and breath sounds normal. No tachypnea. No respiratory distress. She has no decreased breath sounds. She has no wheezes. She has no rhonchi. She has no rales.  Abdominal: Soft. Normal appearance and bowel sounds are normal. There is no tenderness.  Neurological: She is alert.  Skin: Skin is warm, dry and intact. No rash noted.  Psychiatric: Her speech is normal and behavior is normal. Judgment and thought content normal. Her mood appears not anxious. Cognition and memory are normal. She does not exhibit a depressed mood.          Assessment & Plan:

## 2015-05-30 NOTE — Patient Instructions (Signed)
Increase sertraline to 2 tabs daily for a total of 50 mg daily.

## 2015-05-30 NOTE — Assessment & Plan Note (Signed)
Improved control in mood and GI issues.  Increase to maintenance dose sertraline 50 mg daily.

## 2015-05-30 NOTE — Progress Notes (Signed)
Pre visit review using our clinic review tool, if applicable. No additional management support is needed unless otherwise documented below in the visit note. 

## 2015-06-01 DIAGNOSIS — J101 Influenza due to other identified influenza virus with other respiratory manifestations: Secondary | ICD-10-CM

## 2015-06-01 HISTORY — DX: Influenza due to other identified influenza virus with other respiratory manifestations: J10.1

## 2015-06-08 ENCOUNTER — Inpatient Hospital Stay (HOSPITAL_COMMUNITY)
Admission: EM | Admit: 2015-06-08 | Discharge: 2015-06-16 | DRG: 871 | Disposition: A | Discharge status: Home / Self Care | Payer: Medicare Other | Attending: Internal Medicine | Admitting: Internal Medicine

## 2015-06-08 ENCOUNTER — Emergency Department (HOSPITAL_COMMUNITY): Payer: Medicare Other

## 2015-06-08 ENCOUNTER — Encounter (HOSPITAL_COMMUNITY): Payer: Self-pay | Admitting: Family Medicine

## 2015-06-08 DIAGNOSIS — J441 Chronic obstructive pulmonary disease with (acute) exacerbation: Secondary | ICD-10-CM | POA: Diagnosis present

## 2015-06-08 DIAGNOSIS — F419 Anxiety disorder, unspecified: Secondary | ICD-10-CM | POA: Diagnosis present

## 2015-06-08 DIAGNOSIS — J9601 Acute respiratory failure with hypoxia: Secondary | ICD-10-CM | POA: Diagnosis not present

## 2015-06-08 DIAGNOSIS — F17210 Nicotine dependence, cigarettes, uncomplicated: Secondary | ICD-10-CM | POA: Diagnosis present

## 2015-06-08 DIAGNOSIS — Z7902 Long term (current) use of antithrombotics/antiplatelets: Secondary | ICD-10-CM

## 2015-06-08 DIAGNOSIS — R112 Nausea with vomiting, unspecified: Secondary | ICD-10-CM | POA: Diagnosis not present

## 2015-06-08 DIAGNOSIS — F329 Major depressive disorder, single episode, unspecified: Secondary | ICD-10-CM | POA: Diagnosis present

## 2015-06-08 DIAGNOSIS — E039 Hypothyroidism, unspecified: Secondary | ICD-10-CM | POA: Diagnosis present

## 2015-06-08 DIAGNOSIS — E7800 Pure hypercholesterolemia, unspecified: Secondary | ICD-10-CM | POA: Diagnosis present

## 2015-06-08 DIAGNOSIS — J09X2 Influenza due to identified novel influenza A virus with other respiratory manifestations: Secondary | ICD-10-CM | POA: Diagnosis not present

## 2015-06-08 DIAGNOSIS — J810 Acute pulmonary edema: Secondary | ICD-10-CM

## 2015-06-08 DIAGNOSIS — I10 Essential (primary) hypertension: Secondary | ICD-10-CM | POA: Diagnosis present

## 2015-06-08 DIAGNOSIS — A419 Sepsis, unspecified organism: Principal | ICD-10-CM | POA: Diagnosis present

## 2015-06-08 DIAGNOSIS — J189 Pneumonia, unspecified organism: Secondary | ICD-10-CM | POA: Insufficient documentation

## 2015-06-08 DIAGNOSIS — I248 Other forms of acute ischemic heart disease: Secondary | ICD-10-CM | POA: Diagnosis not present

## 2015-06-08 DIAGNOSIS — J9600 Acute respiratory failure, unspecified whether with hypoxia or hypercapnia: Secondary | ICD-10-CM

## 2015-06-08 DIAGNOSIS — J101 Influenza due to other identified influenza virus with other respiratory manifestations: Secondary | ICD-10-CM

## 2015-06-08 DIAGNOSIS — J9690 Respiratory failure, unspecified, unspecified whether with hypoxia or hypercapnia: Secondary | ICD-10-CM

## 2015-06-08 DIAGNOSIS — R319 Hematuria, unspecified: Secondary | ICD-10-CM | POA: Diagnosis present

## 2015-06-08 DIAGNOSIS — R0602 Shortness of breath: Secondary | ICD-10-CM | POA: Diagnosis not present

## 2015-06-08 DIAGNOSIS — Z8673 Personal history of transient ischemic attack (TIA), and cerebral infarction without residual deficits: Secondary | ICD-10-CM

## 2015-06-08 DIAGNOSIS — R197 Diarrhea, unspecified: Secondary | ICD-10-CM | POA: Diagnosis not present

## 2015-06-08 DIAGNOSIS — J440 Chronic obstructive pulmonary disease with acute lower respiratory infection: Secondary | ICD-10-CM | POA: Diagnosis not present

## 2015-06-08 DIAGNOSIS — K219 Gastro-esophageal reflux disease without esophagitis: Secondary | ICD-10-CM | POA: Diagnosis not present

## 2015-06-08 DIAGNOSIS — Z720 Tobacco use: Secondary | ICD-10-CM

## 2015-06-08 DIAGNOSIS — R509 Fever, unspecified: Secondary | ICD-10-CM | POA: Diagnosis not present

## 2015-06-08 DIAGNOSIS — E876 Hypokalemia: Secondary | ICD-10-CM | POA: Diagnosis not present

## 2015-06-08 DIAGNOSIS — B370 Candidal stomatitis: Secondary | ICD-10-CM | POA: Diagnosis not present

## 2015-06-08 DIAGNOSIS — R739 Hyperglycemia, unspecified: Secondary | ICD-10-CM | POA: Diagnosis present

## 2015-06-08 DIAGNOSIS — E785 Hyperlipidemia, unspecified: Secondary | ICD-10-CM | POA: Diagnosis present

## 2015-06-08 DIAGNOSIS — E860 Dehydration: Secondary | ICD-10-CM | POA: Diagnosis present

## 2015-06-08 DIAGNOSIS — M8580 Other specified disorders of bone density and structure, unspecified site: Secondary | ICD-10-CM | POA: Diagnosis present

## 2015-06-08 DIAGNOSIS — Z85828 Personal history of other malignant neoplasm of skin: Secondary | ICD-10-CM

## 2015-06-08 DIAGNOSIS — K589 Irritable bowel syndrome without diarrhea: Secondary | ICD-10-CM

## 2015-06-08 DIAGNOSIS — E871 Hypo-osmolality and hyponatremia: Secondary | ICD-10-CM | POA: Diagnosis present

## 2015-06-08 DIAGNOSIS — R05 Cough: Secondary | ICD-10-CM | POA: Diagnosis not present

## 2015-06-08 DIAGNOSIS — Z79899 Other long term (current) drug therapy: Secondary | ICD-10-CM

## 2015-06-08 DIAGNOSIS — K580 Irritable bowel syndrome with diarrhea: Secondary | ICD-10-CM | POA: Diagnosis present

## 2015-06-08 HISTORY — DX: Irritable bowel syndrome, unspecified: K58.9

## 2015-06-08 HISTORY — DX: Influenza due to other identified influenza virus with other respiratory manifestations: J10.1

## 2015-06-08 LAB — INFLUENZA PANEL BY PCR (TYPE A & B)
H1N1 flu by pcr: DETECTED — AB
Influenza A By PCR: POSITIVE — AB
Influenza B By PCR: NEGATIVE

## 2015-06-08 LAB — URINE MICROSCOPIC-ADD ON

## 2015-06-08 LAB — I-STAT TROPONIN, ED: Troponin i, poc: 0 ng/mL (ref 0.00–0.08)

## 2015-06-08 LAB — CBC WITH DIFFERENTIAL/PLATELET
Basophils Absolute: 0 10*3/uL (ref 0.0–0.1)
Basophils Relative: 0 %
Eosinophils Absolute: 0 10*3/uL (ref 0.0–0.7)
Eosinophils Relative: 0 %
HCT: 39.3 % (ref 36.0–46.0)
Hemoglobin: 13 g/dL (ref 12.0–15.0)
Lymphocytes Relative: 13 %
Lymphs Abs: 0.8 10*3/uL (ref 0.7–4.0)
MCH: 28.9 pg (ref 26.0–34.0)
MCHC: 33.1 g/dL (ref 30.0–36.0)
MCV: 87.3 fL (ref 78.0–100.0)
Monocytes Absolute: 0.4 10*3/uL (ref 0.1–1.0)
Monocytes Relative: 7 %
Neutro Abs: 4.9 10*3/uL (ref 1.7–7.7)
Neutrophils Relative %: 80 %
Platelets: 242 10*3/uL (ref 150–400)
RBC: 4.5 MIL/uL (ref 3.87–5.11)
RDW: 14.8 % (ref 11.5–15.5)
WBC: 6.2 10*3/uL (ref 4.0–10.5)

## 2015-06-08 LAB — I-STAT CG4 LACTIC ACID, ED
Lactic Acid, Venous: 0.66 mmol/L (ref 0.5–2.0)
Lactic Acid, Venous: 1.12 mmol/L (ref 0.5–2.0)

## 2015-06-08 LAB — COMPREHENSIVE METABOLIC PANEL
ALT: 22 U/L (ref 14–54)
AST: 24 U/L (ref 15–41)
Albumin: 3.4 g/dL — ABNORMAL LOW (ref 3.5–5.0)
Alkaline Phosphatase: 90 U/L (ref 38–126)
Anion gap: 11 (ref 5–15)
BUN: 11 mg/dL (ref 6–20)
CO2: 20 mmol/L — ABNORMAL LOW (ref 22–32)
Calcium: 8.2 mg/dL — ABNORMAL LOW (ref 8.9–10.3)
Chloride: 96 mmol/L — ABNORMAL LOW (ref 101–111)
Creatinine, Ser: 0.69 mg/dL (ref 0.44–1.00)
GFR calc Af Amer: >60 mL/min (ref >60)
GFR calc non Af Amer: >60 mL/min (ref >60)
Glucose, Bld: 124 mg/dL — ABNORMAL HIGH (ref 65–99)
Potassium: 3.6 mmol/L (ref 3.5–5.1)
Sodium: 127 mmol/L — ABNORMAL LOW (ref 135–145)
Total Bilirubin: 0.4 mg/dL (ref 0.3–1.2)
Total Protein: 6.3 g/dL — ABNORMAL LOW (ref 6.5–8.1)

## 2015-06-08 LAB — URINALYSIS, ROUTINE W REFLEX MICROSCOPIC
Glucose, UA: NEGATIVE mg/dL
Ketones, ur: 15 mg/dL — AB
Leukocytes, UA: NEGATIVE
Nitrite: NEGATIVE
Protein, ur: 100 mg/dL — AB
Specific Gravity, Urine: 1.035 — ABNORMAL HIGH (ref 1.005–1.030)
pH: 6 (ref 5.0–8.0)

## 2015-06-08 LAB — I-STAT ARTERIAL BLOOD GAS, ED
Acid-base deficit: 4 mmol/L — ABNORMAL HIGH (ref 0.0–2.0)
Bicarbonate: 20.1 mEq/L (ref 20.0–24.0)
O2 Saturation: 94 %
TCO2: 21 mmol/L (ref 0–100)
pCO2 arterial: 32.1 mmHg — ABNORMAL LOW (ref 35.0–45.0)
pH, Arterial: 7.404 (ref 7.350–7.450)
pO2, Arterial: 72 mmHg — ABNORMAL LOW (ref 80.0–100.0)

## 2015-06-08 MED ORDER — ACETAMINOPHEN 650 MG RE SUPP: 650.0000 mg | Freq: Four times a day (QID) | RECTAL | Status: DC | PRN

## 2015-06-08 MED ORDER — METHYLPREDNISOLONE SODIUM SUCC 125 MG IJ SOLR
125.0000 mg | Freq: Once | INTRAMUSCULAR | Status: AC
  Administered 2015-06-08: 125 mg via INTRAVENOUS
  Filled 2015-06-08: qty 2

## 2015-06-08 MED ORDER — KETOROLAC TROMETHAMINE 30 MG/ML IJ SOLN
30.0000 mg | Freq: Once | INTRAMUSCULAR | Status: AC
  Administered 2015-06-08: 30 mg via INTRAVENOUS
  Filled 2015-06-08: qty 1

## 2015-06-08 MED ORDER — AMLODIPINE BESYLATE 10 MG PO TABS
10.0000 mg | ORAL_TABLET | Freq: Every day | ORAL | Status: DC
  Administered 2015-06-08 – 2015-06-10 (×3): 10 mg via ORAL
  Filled 2015-06-08 (×3): qty 1

## 2015-06-08 MED ORDER — ASPIRIN-DIPYRIDAMOLE ER 25-200 MG PO CP12
1.0000 | ORAL_CAPSULE | Freq: Two times a day (BID) | ORAL | Status: DC
  Administered 2015-06-08 – 2015-06-10 (×5): 1 via ORAL
  Filled 2015-06-08 (×9): qty 1

## 2015-06-08 MED ORDER — SODIUM CHLORIDE 0.9 % IV BOLUS (SEPSIS)
1000.0000 mL | Freq: Once | INTRAVENOUS | Status: AC
  Administered 2015-06-08: 1000 mL via INTRAVENOUS

## 2015-06-08 MED ORDER — AZITHROMYCIN 250 MG PO TABS
500.0000 mg | ORAL_TABLET | Freq: Once | ORAL | Status: AC
  Administered 2015-06-08: 500 mg via ORAL
  Filled 2015-06-08: qty 2

## 2015-06-08 MED ORDER — GUAIFENESIN ER 600 MG PO TB12
1200.0000 mg | ORAL_TABLET | Freq: Two times a day (BID) | ORAL | Status: DC
  Administered 2015-06-08 – 2015-06-16 (×16): 1200 mg via ORAL
  Filled 2015-06-08 (×17): qty 2

## 2015-06-08 MED ORDER — IPRATROPIUM-ALBUTEROL 0.5-2.5 (3) MG/3ML IN SOLN
3.0000 mL | RESPIRATORY_TRACT | Status: DC
  Administered 2015-06-08 (×2): 3 mL via RESPIRATORY_TRACT
  Filled 2015-06-08 (×2): qty 3

## 2015-06-08 MED ORDER — IPRATROPIUM-ALBUTEROL 0.5-2.5 (3) MG/3ML IN SOLN
3.0000 mL | Freq: Three times a day (TID) | RESPIRATORY_TRACT | Status: DC
  Administered 2015-06-09 – 2015-06-14 (×15): 3 mL via RESPIRATORY_TRACT
  Filled 2015-06-08 (×16): qty 3

## 2015-06-08 MED ORDER — SODIUM CHLORIDE 0.9 % IV SOLN
INTRAVENOUS | Status: DC
  Administered 2015-06-08 – 2015-06-10 (×4): via INTRAVENOUS

## 2015-06-08 MED ORDER — ONDANSETRON HCL 4 MG/2ML IJ SOLN
4.0000 mg | Freq: Once | INTRAMUSCULAR | Status: AC
  Administered 2015-06-08: 4 mg via INTRAVENOUS
  Filled 2015-06-08: qty 2

## 2015-06-08 MED ORDER — PANTOPRAZOLE SODIUM 40 MG PO TBEC: 40.0000 mg | DELAYED_RELEASE_TABLET | Freq: Every day | ORAL | Status: DC

## 2015-06-08 MED ORDER — HYDROCHLOROTHIAZIDE 25 MG PO TABS
25.0000 mg | ORAL_TABLET | Freq: Every day | ORAL | Status: DC
  Administered 2015-06-08 – 2015-06-09 (×2): 25 mg via ORAL
  Filled 2015-06-08 (×2): qty 1

## 2015-06-08 MED ORDER — RIFAXIMIN 550 MG PO TABS: 550.0000 mg | ORAL_TABLET | Freq: Three times a day (TID) | ORAL | Status: DC | PRN

## 2015-06-08 MED ORDER — LEVOFLOXACIN IN D5W 750 MG/150ML IV SOLN
750.0000 mg | INTRAVENOUS | Status: DC
  Administered 2015-06-08: 750 mg via INTRAVENOUS
  Filled 2015-06-08 (×2): qty 150

## 2015-06-08 MED ORDER — ACETAMINOPHEN 325 MG PO TABS
650.0000 mg | ORAL_TABLET | Freq: Four times a day (QID) | ORAL | Status: DC | PRN
  Administered 2015-06-08 – 2015-06-15 (×16): 650 mg via ORAL
  Filled 2015-06-08 (×16): qty 2

## 2015-06-08 MED ORDER — ALBUTEROL SULFATE (2.5 MG/3ML) 0.083% IN NEBU
5.0000 mg | INHALATION_SOLUTION | Freq: Once | RESPIRATORY_TRACT | Status: AC
  Administered 2015-06-08: 5 mg via RESPIRATORY_TRACT
  Filled 2015-06-08: qty 6

## 2015-06-08 MED ORDER — ENOXAPARIN SODIUM 40 MG/0.4ML ~~LOC~~ SOLN
40.0000 mg | SUBCUTANEOUS | Status: DC
  Administered 2015-06-08 – 2015-06-16 (×9): 40 mg via SUBCUTANEOUS
  Filled 2015-06-08 (×9): qty 0.4

## 2015-06-08 MED ORDER — PANTOPRAZOLE SODIUM 40 MG IV SOLR
40.0000 mg | Freq: Two times a day (BID) | INTRAVENOUS | Status: DC
  Administered 2015-06-08 – 2015-06-10 (×5): 40 mg via INTRAVENOUS
  Filled 2015-06-08 (×5): qty 40

## 2015-06-08 MED ORDER — ACETAMINOPHEN 325 MG PO TABS
650.0000 mg | ORAL_TABLET | Freq: Once | ORAL | Status: AC
  Administered 2015-06-08: 650 mg via ORAL
  Filled 2015-06-08: qty 2

## 2015-06-08 MED ORDER — IPRATROPIUM BROMIDE 0.02 % IN SOLN
0.5000 mg | Freq: Once | RESPIRATORY_TRACT | Status: AC
  Administered 2015-06-08: 0.5 mg via RESPIRATORY_TRACT
  Filled 2015-06-08: qty 2.5

## 2015-06-08 MED ORDER — THYROID 30 MG PO TABS
90.0000 mg | ORAL_TABLET | Freq: Every day | ORAL | Status: DC
  Administered 2015-06-09 – 2015-06-12 (×3): 90 mg via ORAL
  Filled 2015-06-08 (×7): qty 1

## 2015-06-08 MED ORDER — ONDANSETRON HCL 4 MG/2ML IJ SOLN
4.0000 mg | Freq: Once | INTRAMUSCULAR | Status: AC
  Administered 2015-06-08: 4 mg via INTRAMUSCULAR
  Filled 2015-06-08: qty 2

## 2015-06-08 MED ORDER — NICOTINE POLACRILEX 2 MG MT GUM: 2.0000 mg | CHEWING_GUM | OROMUCOSAL | Status: DC | PRN

## 2015-06-08 MED ORDER — PROMETHAZINE HCL 25 MG PO TABS
25.0000 mg | ORAL_TABLET | Freq: Four times a day (QID) | ORAL | Status: DC | PRN
  Administered 2015-06-08 – 2015-06-11 (×5): 25 mg via ORAL
  Filled 2015-06-08 (×5): qty 1

## 2015-06-08 MED ORDER — SERTRALINE HCL 50 MG PO TABS
25.0000 mg | ORAL_TABLET | Freq: Every day | ORAL | Status: DC
  Administered 2015-06-08 – 2015-06-16 (×9): 25 mg via ORAL
  Filled 2015-06-08 (×9): qty 1

## 2015-06-08 MED ORDER — METHYLPREDNISOLONE SODIUM SUCC 125 MG IJ SOLR
60.0000 mg | INTRAMUSCULAR | Status: DC
  Administered 2015-06-08: 60 mg via INTRAVENOUS
  Filled 2015-06-08: qty 2

## 2015-06-08 MED ORDER — DEXTROSE 5 % IV SOLN
1.0000 g | Freq: Once | INTRAVENOUS | Status: AC
  Administered 2015-06-08: 1 g via INTRAVENOUS
  Filled 2015-06-08: qty 10

## 2015-06-08 NOTE — Evaluation (Addendum)
Physical Therapy Evaluation Patient Details Name: Tiffany Orozco MRN: [id number] DOB: 1951-01-14 Today's Date: 06/08/2015   History of Present Illness  65 y.o. female with nausea, vomiting, and diarrhea admitted with Acute respiratory failure. Influenza A (H1N1) positive.  Clinical Impression  Pt admitted with the above complications. Pt currently with functional limitations due to the deficits listed below (see PT Problem List). Demonstrates minor instability with gait, declines to use an assistive device but supports self with hand on IV pole during ambulation. SpO2 95% on 1L supplemental O2 while ambulatory, down to 87% on 1L supplemental O2. Anticipate she will progress quickly towards functional goals as she improves medically.  Will follow and progress while admitted.       Follow Up Recommendations No PT follow up    Equipment Recommendations  None recommended by PT (Pt plans to use cane at home)    Recommendations for Other Services       Precautions / Restrictions Precautions Precautions: None Restrictions Weight Bearing Restrictions: No      Mobility  Bed Mobility Overal bed mobility: Modified Independent Bed Mobility: Supine to Sit;Sit to Supine     Supine to sit: Modified independent (Device/Increase time) Sit to supine: Modified independent (Device/Increase time)   General bed mobility comments: extra time. No assist  Transfers Overall transfer level: Modified independent Equipment used: None             General transfer comment: Min sway noted but able to self correct. Not reaching for support.  Ambulation/Gait Ambulation/Gait assistance: Supervision Ambulation Distance (Feet): 115 Feet Assistive device: None (IV pole) Gait Pattern/deviations: Step-through pattern;Decreased stride length;Drifts right/left Gait velocity: decreased Gait velocity interpretation: Below normal speed for age/gender General Gait Details: Intermittent sway, touching  furniture for support in room, declined use of RW for support. Prefers to hold IV pole for support. VC for awareness. SpO2 87% on room air, 95% on 1L supplemental O2. Mild dyspnea. States she feels weak. No overt loss of balance or buckling. Cues for pursed lip breathing.  Stairs            Wheelchair Mobility    Modified Rankin (Stroke Patients Only)       Balance Overall balance assessment: Needs assistance Sitting-balance support: No upper extremity supported;Feet supported Sitting balance-Leahy Scale: Normal     Standing balance support: No upper extremity supported Standing balance-Leahy Scale: Good                               Pertinent Vitals/Pain Pain Assessment: No/denies pain    Home Living Family/patient expects to be discharged to:: Private residence Living Arrangements: Spouse/significant other;Children Available Help at Discharge: Family;Available 24 hours/day Type of Home: House Home Access: Stairs to enter Entrance Stairs-Rails: None Entrance Stairs-Number of Steps: 2 Home Layout: One level Home Equipment: Shower seat - built in;Cane - single point      Prior Function Level of Independence: Independent               Hand Dominance        Extremity/Trunk Assessment   Upper Extremity Assessment: Defer to OT evaluation           Lower Extremity Assessment: Generalized weakness         Communication   Communication: No difficulties  Cognition Arousal/Alertness: Awake/alert Behavior During Therapy: WFL for tasks assessed/performed Overall Cognitive Status: Within Functional Limits for tasks assessed  General Comments General comments (skin integrity, edema, etc.): Resting O2 >96% on 2L supplemental O2    Exercises        Assessment/Plan    PT Assessment Patient needs continued PT services  PT Diagnosis Difficulty walking;Abnormality of gait;Generalized weakness   PT Problem  List Decreased strength;Decreased activity tolerance;Decreased balance;Decreased mobility;Decreased knowledge of use of DME;Cardiopulmonary status limiting activity  PT Treatment Interventions DME instruction;Gait training;Stair training;Functional mobility training;Therapeutic activities;Therapeutic exercise;Balance training;Patient/family education   PT Goals (Current goals can be found in the Care Plan section) Acute Rehab PT Goals Patient Stated Goal: Get well PT Goal Formulation: With patient Time For Goal Achievement: 06/22/15 Potential to Achieve Goals: Good    Frequency Min 3X/week   Barriers to discharge        Co-evaluation               End of Session Equipment Utilized During Treatment: Oxygen Activity Tolerance: Patient tolerated treatment well Patient left: in bed;with call bell/phone within reach;with bed alarm set Nurse Communication: Mobility status    Functional Assessment Tool Used: clinical observation Functional Limitation: Mobility: Walking and moving around Mobility: Walking and Moving Around Current Status [id number]): At least 1 percent but less than 20 percent impaired, limited or restricted Mobility: Walking and Moving Around Goal Status [phone]): At least 1 percent but less than 20 percent impaired, limited or restricted    Time: [id number] PT Time Calculation (min) (ACUTE ONLY): 21 min   Charges:   PT Evaluation $PT Eval Low Complexity: 1 Procedure     PT G Codes:   PT G-Codes **NOT FOR INPATIENT CLASS** Functional Assessment Tool Used: clinical observation Functional Limitation: Mobility: Walking and moving around Mobility: Walking and Moving Around Current Status [id number]): At least 1 percent but less than 20 percent impaired, limited or restricted Mobility: Walking and Moving Around Goal Status [phone]): At least 1 percent but less than 20 percent impaired, limited or restricted    Ellouise Newer 06/08/2015, 4:51 PM  Camille Bal [place],  [place]

## 2015-06-08 NOTE — ED Notes (Signed)
Attempted report 

## 2015-06-08 NOTE — ED Notes (Signed)
MD at bedside. 

## 2015-06-08 NOTE — ED Notes (Signed)
Admitting MD at bedside.

## 2015-06-08 NOTE — ED Notes (Signed)
PT. Consistently O2 sat. In upper 80%. Pt. Placed on 2L O2 [place] at this time.

## 2015-06-08 NOTE — ED Notes (Signed)
Pt. Coming from home c/o flu-like symptoms for the last 3 days. Pt. Endorses N/V/D, cough, and fever at home. Pt. sts she has tried aleve to bring down the fever. Pt. Hx of stomach issues. Pt. On abx for stomach problems at this time.

## 2015-06-08 NOTE — ED Notes (Signed)
RT at bedside.

## 2015-06-08 NOTE — ED Notes (Signed)
Pt. Unable to drink water without feeling nauseous.

## 2015-06-08 NOTE — H&P (Signed)
[facility] History and Physical  Tiffany Orozco [id number] DOB: 07/10/1950 DOA: 06/08/2015  Referring physician: Dr Jeanell Sparrow - [facility] PCP: [name], MD   Chief Complaint: Emesis and fevers  [name] is a 65 y.o. female  Nausea vomiting diarrhea and cough. Started 2 days ago. Home max temperature 100 measured but pt states she didn't take it when hers was hotter than that. Aleve with some improvement. Getting worse overall. Cough is productive with yellow thick sputum. Dyspnea on exertion. No home O2. Continues to smoke cigarettes, 1/2ppd. States that she's had approximately 15 episodes of nonbloody diarrhea and vomiting since onset. Denies coffee-ground or bilious emesis. No inhalers at home. Unable to keep down any medications.    Started Rifaximin in mid December for chonic IBS     Review of Systems:  Constitutional:  night sweats, Fevers, chills, fatigue.  HEENT:  No headaches, Difficulty swallowing,Tooth/dental problems,Sore throat, Cardio-vascular:  No chest pain, Orthopnea, PND, swelling in lower extremities, anasarca, dizziness, palpitations  GI: Per HPI Resp: Per HPi Skin:  no rash or lesions.  GU:  no dysuria, change in color of urine, no urgency or frequency. No flank pain.  Musculoskeletal:   No joint pain or swelling. No decreased range of motion. No back pain.  Psych:  No change in mood or affect. No depression or anxiety. No memory loss.  Neuro:  HA,  No change in sensation, unilateral strength, or cognitive abilities  All other systems were reviewed and are negative.  Past Medical History  Diagnosis Date  . Anxiety   . Depression   . GERD (gastroesophageal reflux disease)   . Hypothyroidism   . Skin cancer of face   . Skin cancer of arm   . Allergy   . COPD (chronic obstructive pulmonary disease) ([place])   . Hyperlipidemia   . Hypertension   . Obesity   . DDD (degenerative disc disease), lumbar     mild  . Stroke ([place]) 07/2010      no permenant deficits  . Osteopenia   . Heart murmur   . Thrombocytopenia ([place])   . Female rectocele without uterine prolapse   . IBS (irritable bowel syndrome)    Past Surgical History  Procedure Laterality Date  . Tubal ligation  1975  . Appendectomy    . Foot surgery  03/31/2009    R foot  . Emgs  7/04    atms and wrists neg  . Cholecystectomy  2004  . Foot surgery  2008, 2009, 2010    right foot   . Esophageal manometry N/A 03/17/2015    Procedure: ESOPHAGEAL MANOMETRY (EM);  Surgeon: Manus Gunning, MD;  Location: [facility];  Service: Gastroenterology;  Laterality: N/A;   Social History:  reports that she has been smoking Cigarettes.  She has a 31 pack-year smoking history. She has never used smokeless tobacco. She reports that she does not drink alcohol or use illicit drugs.  Allergies  Allergen Reactions  . Lisinopril     Shortness of breath.  . Niacin     REACTION: dizziness  . Welchol [Colesevelam Hcl] Other (See Comments)    Diarrhea,Nausea, swelling in face  . Augmentin [Amoxicillin-Pot Clavulanate]     Nauseated. GI upset with taking.  . Codeine     keeps patient awake  . Oxycodone Hcl     Patient states this medication makes her hyper    Family History  Problem Relation Age of Onset  . Skin cancer  Father   . Hypertension Mother      Prior to Admission medications   Medication Sig Start Date End Date Taking? Authorizing Provider  amLODipine (NORVASC) 10 MG tablet TAKE 1 TABLET (10 MG TOTAL) BY MOUTH DAILY. 12/26/14  Yes Amy Cletis Athens, MD  ARMOUR THYROID 90 MG tablet TAKE 1 TABLET (90 MG TOTAL) BY MOUTH DAILY. 05/01/15  Yes Amy Cletis Athens, MD  Aspirin-Salicylamide-Caffeine (BC HEADACHE POWDER PO) Take 1 packet by mouth every 8 (eight) hours as needed (pain).   Yes Historical Provider, MD  cetirizine (ZYRTEC) 10 MG tablet Take 10 mg by mouth daily.   Yes Historical Provider, MD  Cholecalciferol (VITAMIN D-3) 1000 UNITS CAPS Take 2,000 Units by  mouth daily.   Yes Historical Provider, MD  dipyridamole-aspirin (AGGRENOX) 200-25 MG 12hr capsule Take 1 capsule by mouth 2 (two) times daily. 05/14/15  Yes Amy Cletis Athens, MD  ezetimibe (ZETIA) 10 MG tablet Take 1 tablet (10 mg total) by mouth daily. 01/17/15  Yes Amy Cletis Athens, MD  hydrochlorothiazide (HYDRODIURIL) 25 MG tablet Take 1 tablet (25 mg total) by mouth daily. 05/30/15  Yes Amy Cletis Athens, MD  RABEprazole (ACIPHEX) 20 MG tablet Take 1 tablet (20 mg total) by mouth 2 (two) times daily. 05/15/15  Yes Manus Gunning, MD  rifaximin (XIFAXAN) 550 MG TABS tablet Take 1 tablet (550 mg total) by mouth 3 (three) times daily. Patient taking differently: Take 550 mg by mouth 3 (three) times daily as needed. For diarrhea 05/15/15  Yes Manus Gunning, MD  sertraline (ZOLOFT) 50 MG tablet Take 0.5 tablets (25 mg total) by mouth daily. 05/30/15  Yes Amy E Bedsole, MD  ondansetron (ZOFRAN) 4 MG tablet Take 1 tablet (4 mg total) by mouth every 8 (eight) hours as needed for nausea. 04/22/15   Amy Cletis Athens, MD  polyethylene glycol powder (GLYCOLAX/MIRALAX) powder 17 grams daily prn constipation. Patient taking differently: Take 1 Container by mouth daily as needed for mild constipation.  07/19/14   Jinny Sanders, MD   Physical Exam: Filed Vitals:   06/08/15 1100 06/08/15 1104 06/08/15 1115 06/08/15 1200  BP: 142/60  146/59 124/61  Pulse: 98  103 100  Temp:  102.6 F (39.2 C)    TempSrc:  Oral    Resp: 37  33 27  Height:      Weight:      SpO2: 95%  90% 94%    Wt Readings from Last 3 Encounters:  06/08/15 74.39 kg (164 lb)  05/30/15 77.111 kg (170 lb)  05/15/15 78.654 kg (173 lb 6.4 oz)    General: Ill-appearing but well-nourished well-developed Eyes:  PERRL, EOMI, normal lids, iris ENT:  grossly normal hearing, lips & tongue Neck:  no LAD, masses or thyromegaly Cardiovascular:  RRR, no m/r/g. No LE edema.  Respiratory: Bilateral crackles in the bases with decreased breath  sounds. Slight increased effort. Right greater than left. On nasal cannula  Abdomen:  soft, ntnd Skin:  no rash or induration seen on limited exam Musculoskeletal:  grossly normal tone BUE/BLE Psychiatric:  grossly normal mood and affect, speech fluent and appropriate Neurologic:  CN 2-12 grossly intact, moves all extremities in coordinated fashion.          Labs on Admission:  Basic Metabolic Panel:  Recent Labs Lab 06/08/15 0758  NA 127*  K 3.6  CL 96*  CO2 20*  GLUCOSE 124*  BUN 11  CREATININE 0.69  CALCIUM 8.2*   Liver Function  Tests:  Recent Labs Lab 06/08/15 0758  AST 24  ALT 22  ALKPHOS 90  BILITOT 0.4  PROT 6.3*  ALBUMIN 3.4*   No results for input(s): LIPASE, AMYLASE in the last 168 hours. No results for input(s): AMMONIA in the last 168 hours. CBC:  Recent Labs Lab 06/08/15 0758  WBC 6.2  NEUTROABS 4.9  HGB 13.0  HCT 39.3  MCV 87.3  PLT 242   Cardiac Enzymes: No results for input(s): CKTOTAL, CKMB, CKMBINDEX, TROPONINI in the last 168 hours.  BNP (last 3 results) No results for input(s): BNP in the last 8760 hours.  ProBNP (last 3 results) No results for input(s): PROBNP in the last 8760 hours.   CREATININE: 0.69 (06/08/15 0758) Estimated creatinine clearance - 62.4 mL/min  CBG: No results for input(s): GLUCAP in the last 168 hours.  Radiological Exams on Admission: Dg Chest Portable 1 View  06/08/2015  CLINICAL DATA:  Patient with cough and shortness of breath. EXAM: PORTABLE CHEST 1 VIEW COMPARISON:  Chest radiograph 01/25/2015. FINDINGS: Monitoring leads overlie the patient. Stable cardiac and mediastinal contours. Minimal heterogeneous opacities within the lung bases bilaterally. No pleural effusion or pneumothorax. IMPRESSION: Bibasilar opacities favored to represent atelectasis. Electronically Signed   By: Lovey Newcomer M.D.   On: 06/08/2015 08:25     Assessment/Plan Principal Problem:   Acute respiratory failure (HCC) Active  Problems:   HYPERCHOLESTEROLEMIA   GERD   Mild dehydration   Hyponatremia   COPD exacerbation (HCC)   History of CVA (cerebrovascular accident)   IBS (irritable bowel syndrome)   Acute respiratory failure: Patient with new O2 requirement. Likely secondary to viral illness, likely flu, and underlying COPD. Possible pneumonia based on CXR but not overly convincing. Lactic acid 1.12, febrile, hemodynamically stable, tachypneic, WBC 6.2. Solu-Medrol and duo nebs in ED with improvement. - MedSurg - ABG - Follow-up flu panel - Continue Solu-Medrol 60 Q24 - Duo nebs every 4 - Sputum culture - Levaquin (stop CTX and Azithro) - Mucinex - O2 when necessary - Outpatient PFTs - Dis[facility] hospital w/ [facility]  Nausea/vomiting/diarrhea: likely secondary to viral illness such as flu. Treatment as above and below. Improved in ED after IVF and zofran   Hyponatremia: 127. Likely from acute GI loss.  - IVF - BMET in am  HTN: - continue home HCTZ, Norvasc  IBS: - Xifaxan  Previous CVA: - continue Aggrenox  Hypothyroid: - continue armour thyroid  GERD: - continue home PPI  Hematuria: UA grossly abnormal but no direct evidence of infection. Dirty sample - f/u outpt.   Tobacco: 1/2ppd - nicotine   Code Status: FULL  DVT Prophylaxis: Lovenox Family Communication: husband Disposition Plan: Pending Improvement    MERRELL, DAVID J, MD Family Medicine [facility] [email or web address] Password [email or web address]

## 2015-06-08 NOTE — ED Provider Notes (Addendum)
CSN: [id number]     Arrival date & time 06/08/15  0710 History   First MD Initiated Contact with Patient 06/08/15 [phone]     Chief Complaint  Patient presents with  . Emesis  . Fever     (Consider location/radiation/quality/duration/timing/severity/associated sxs/prior Treatment) HPI 65 year old female presents today complaining of cough, nausea, vomiting, and diarrhea that began 2 days prior to admission. She has had some fever to 100 at home. She has tried Aleve to bring her fever down. She states that the stent productive of a thick discolored sputum. She has some dyspnea. She is a smoker and does continue to smoke. She also has had vomiting and diarrhea to 15 episodes each. She describes this as the food substance and fluids that she is taking in. She denies any blood, dark tarry stool, or coffee-ground emesis. She is complaining of some pain in her chest with coughing. Otherwise she states that she hurts all over. States that she has had some problem with nausea over the past year and has been followed by a gastroenterologist. Past Medical History  Diagnosis Date  . Anxiety   . Depression   . GERD (gastroesophageal reflux disease)   . Hypothyroidism   . Skin cancer of face   . Skin cancer of arm   . Allergy   . COPD (chronic obstructive pulmonary disease) ([place])   . Hyperlipidemia   . Hypertension   . Obesity   . DDD (degenerative disc disease), lumbar     mild  . Stroke ([place]) 07/2010  . Osteopenia   . Heart murmur   . Thrombocytopenia ([place])   . Female rectocele without uterine prolapse    Past Surgical History  Procedure Laterality Date  . Tubal ligation  1975  . Appendectomy    . Foot surgery  03/31/2009    R foot  . Emgs  7/04    atms and wrists neg  . Cholecystectomy  2004  . Foot surgery  2008, 2009, 2010    right foot   . Esophageal manometry N/A 03/17/2015    Procedure: ESOPHAGEAL MANOMETRY (EM);  Surgeon: Manus Gunning, MD;  Location: [facility];   Service: Gastroenterology;  Laterality: N/A;   Family History  Problem Relation Age of Onset  . Skin cancer Father   . Hypertension Mother    Social History  Substance Use Topics  . Smoking status: Current Every Day Smoker -- 1.00 packs/day for 31 years    Types: Cigarettes  . Smokeless tobacco: Never Used     Comment: May try when husband finished treatment for prostate cancer  . Alcohol Use: No   OB History    Gravida Para Term Preterm AB TAB SAB Ectopic Multiple Living   5 2   3  3   3      Review of Systems  All other systems reviewed and are negative.     Allergies  Lisinopril; Niacin; Welchol; Augmentin; Codeine; and Oxycodone hcl  Home Medications   Prior to Admission medications   Medication Sig Start Date End Date Taking? Authorizing Provider  amLODipine (NORVASC) 10 MG tablet TAKE 1 TABLET (10 MG TOTAL) BY MOUTH DAILY. 12/26/14   Amy Cletis Athens, MD  ARMOUR THYROID 90 MG tablet TAKE 1 TABLET (90 MG TOTAL) BY MOUTH DAILY. 05/01/15   Amy Cletis Athens, MD  Aspirin-Salicylamide-Caffeine (BC HEADACHE POWDER PO) Take 1 packet by mouth every 8 (eight) hours as needed (pain).    Historical Provider, MD  cetirizine (ZYRTEC)  10 MG tablet Take 10 mg by mouth daily.    Historical Provider, MD  Cholecalciferol (VITAMIN D-3) 1000 UNITS CAPS Take 2,000 Units by mouth daily.    Historical Provider, MD  dipyridamole-aspirin (AGGRENOX) 200-25 MG 12hr capsule Take 1 capsule by mouth 2 (two) times daily. 05/14/15   Amy Cletis Athens, MD  ezetimibe (ZETIA) 10 MG tablet Take 1 tablet (10 mg total) by mouth daily. 01/17/15   Amy Cletis Athens, MD  hydrochlorothiazide (HYDRODIURIL) 25 MG tablet Take 1 tablet (25 mg total) by mouth daily. 05/30/15   Amy Cletis Athens, MD  ondansetron (ZOFRAN) 4 MG tablet Take 1 tablet (4 mg total) by mouth every 8 (eight) hours as needed for nausea. 04/22/15   Amy Cletis Athens, MD  polyethylene glycol powder (GLYCOLAX/MIRALAX) powder 17 grams daily prn constipation. Patient  taking differently: Take 1 Container by mouth daily as needed.  07/19/14   Amy Cletis Athens, MD  RABEprazole (ACIPHEX) 20 MG tablet Take 1 tablet (20 mg total) by mouth 2 (two) times daily. 05/15/15   Manus Gunning, MD  rifaximin (XIFAXAN) 550 MG TABS tablet Take 1 tablet (550 mg total) by mouth 3 (three) times daily. 05/15/15   Manus Gunning, MD  sertraline (ZOLOFT) 50 MG tablet Take 0.5 tablets (25 mg total) by mouth daily. 05/30/15   Amy E Diona Browner, MD   BP 154/55 mmHg  Pulse 98  Temp(Src) 100.1 F (37.8 C) (Oral)  Resp 24  Ht 5' (1.524 m)  Wt 74.39 kg  BMI 32.03 kg/m2  SpO2 91% Physical Exam  Constitutional: She is oriented to person, place, and time. She appears well-developed and well-nourished.  HENT:  Head: Normocephalic and atraumatic.  Right Ear: External ear normal.  Left Ear: External ear normal.  Nose: Nose normal.  Mouth/Throat: Oropharynx is clear and moist.  Eyes: Conjunctivae and EOM are normal. Pupils are equal, round, and reactive to light.  Neck: Normal range of motion. Neck supple.  Cardiovascular: Normal rate, regular rhythm, normal heart sounds and intact distal pulses.   Pulmonary/Chest: She has wheezes. She has rales.  Cough on exam  Abdominal: Soft. Bowel sounds are normal.  Musculoskeletal: Normal range of motion.  Neurological: She is alert and oriented to person, place, and time. She has normal reflexes.  Skin: Skin is warm and dry.  Psychiatric: She has a normal mood and affect. Her behavior is normal. Judgment and thought content normal.  Nursing note and vitals reviewed.   ED Course  Procedures (including critical care time) Labs Review Labs Reviewed  COMPREHENSIVE METABOLIC PANEL - Abnormal; Notable for the following:    Sodium 127 (*)    Chloride 96 (*)    CO2 20 (*)    Glucose, Bld 124 (*)    Calcium 8.2 (*)    Total Protein 6.3 (*)    Albumin 3.4 (*)    All other components within normal limits  URINALYSIS, ROUTINE W  REFLEX MICROSCOPIC (NOT AT [facility]) - Abnormal; Notable for the following:    Color, Urine AMBER (*)    APPearance TURBID (*)    Specific Gravity, Urine 1.035 (*)    Hgb urine dipstick LARGE (*)    Bilirubin Urine SMALL (*)    Ketones, ur 15 (*)    Protein, ur 100 (*)    All other components within normal limits  URINE MICROSCOPIC-ADD ON - Abnormal; Notable for the following:    Squamous Epithelial / LPF 6-30 (*)    Bacteria, UA  MANY (*)    All other components within normal limits  URINE CULTURE  CBC WITH DIFFERENTIAL/PLATELET  INFLUENZA PANEL BY PCR (TYPE A & B, H1N1)  I-STAT CG4 LACTIC ACID, ED  I-STAT TROPOININ, ED  I-STAT CG4 LACTIC ACID, ED    Imaging Review Dg Chest Portable 1 View  06/08/2015  CLINICAL DATA:  Patient with cough and shortness of breath. EXAM: PORTABLE CHEST 1 VIEW COMPARISON:  Chest radiograph 01/25/2015. FINDINGS: Monitoring leads overlie the patient. Stable cardiac and mediastinal contours. Minimal heterogeneous opacities within the lung bases bilaterally. No pleural effusion or pneumothorax. IMPRESSION: Bibasilar opacities favored to represent atelectasis. Electronically Signed   By: Lovey Newcomer M.D.   On: 06/08/2015 08:25   I have personally reviewed and evaluated these images and lab results as part of my medical decision-making.   EKG Interpretation None      MDM   Final diagnoses:  CAP (community acquired pneumonia)  COPD exacerbation (HCC)  Nausea vomiting and diarrhea      10:24 AM Patient has taken some sips of water without vomiting.  Encouraged to continue sips every few minutes to assess for abiity to continue self hydrating.   65 year old female smoker presents with fever, cough, nausea, vomiting, and diarrhea most consistent with viral syndrome. However, patient does have possible bilateral lower lobe infiltrates noted on chest x-Verita Kuroda. She is treated with Rocephin and Zithromax to cover possible community-acquired pneumonia. Her oxygen  saturations off oxygen were in the upper 80s. She has been retreated here with albuterol and Solu-Medrol for symptoms most consistent with her COPD. She has been given IV hydration but has only been able to hydrate minimally orally due to her nausea. Plan admission for further evaluation and treatment. Discussed with Dr. Carles Collet plan admission to MedSurg Pattricia Boss, MD 06/08/15 [place], MD 06/08/15 1141

## 2015-06-09 ENCOUNTER — Encounter (HOSPITAL_COMMUNITY): Payer: Self-pay | Admitting: General Practice

## 2015-06-09 DIAGNOSIS — J9601 Acute respiratory failure with hypoxia: Secondary | ICD-10-CM | POA: Diagnosis not present

## 2015-06-09 DIAGNOSIS — J09X2 Influenza due to identified novel influenza A virus with other respiratory manifestations: Secondary | ICD-10-CM | POA: Diagnosis not present

## 2015-06-09 DIAGNOSIS — J101 Influenza due to other identified influenza virus with other respiratory manifestations: Secondary | ICD-10-CM | POA: Diagnosis not present

## 2015-06-09 DIAGNOSIS — E871 Hypo-osmolality and hyponatremia: Secondary | ICD-10-CM | POA: Diagnosis not present

## 2015-06-09 DIAGNOSIS — A419 Sepsis, unspecified organism: Secondary | ICD-10-CM | POA: Diagnosis not present

## 2015-06-09 DIAGNOSIS — K219 Gastro-esophageal reflux disease without esophagitis: Secondary | ICD-10-CM | POA: Diagnosis not present

## 2015-06-09 DIAGNOSIS — I248 Other forms of acute ischemic heart disease: Secondary | ICD-10-CM | POA: Diagnosis not present

## 2015-06-09 DIAGNOSIS — Z720 Tobacco use: Secondary | ICD-10-CM

## 2015-06-09 LAB — CBC
HCT: 37 % (ref 36.0–46.0)
Hemoglobin: 12.4 g/dL (ref 12.0–15.0)
MCH: 28.8 pg (ref 26.0–34.0)
MCHC: 33.5 g/dL (ref 30.0–36.0)
MCV: 86 fL (ref 78.0–100.0)
Platelets: 244 10*3/uL (ref 150–400)
RBC: 4.3 MIL/uL (ref 3.87–5.11)
RDW: 14.4 % (ref 11.5–15.5)
WBC: 13.6 10*3/uL — ABNORMAL HIGH (ref 4.0–10.5)

## 2015-06-09 LAB — BASIC METABOLIC PANEL
Anion gap: 9 (ref 5–15)
BUN: 9 mg/dL (ref 6–20)
CO2: 23 mmol/L (ref 22–32)
Calcium: 8.4 mg/dL — ABNORMAL LOW (ref 8.9–10.3)
Chloride: 102 mmol/L (ref 101–111)
Creatinine, Ser: 0.6 mg/dL (ref 0.44–1.00)
GFR calc Af Amer: >60 mL/min (ref >60)
GFR calc non Af Amer: >60 mL/min (ref >60)
Glucose, Bld: 130 mg/dL — ABNORMAL HIGH (ref 65–99)
Potassium: 3.7 mmol/L (ref 3.5–5.1)
Sodium: 134 mmol/L — ABNORMAL LOW (ref 135–145)

## 2015-06-09 LAB — URINE CULTURE: Culture: 10000

## 2015-06-09 LAB — MRSA PCR SCREENING: MRSA by PCR: NEGATIVE

## 2015-06-09 MED ORDER — PREDNISONE 50 MG PO TABS
50.0000 mg | ORAL_TABLET | Freq: Every day | ORAL | Status: DC
  Administered 2015-06-10 – 2015-06-11 (×2): 50 mg via ORAL
  Filled 2015-06-09: qty 1
  Filled 2015-06-09: qty 2

## 2015-06-09 MED ORDER — BOOST / RESOURCE BREEZE PO LIQD
1.0000 | Freq: Two times a day (BID) | ORAL | Status: DC
  Administered 2015-06-11: 1 via ORAL

## 2015-06-09 MED ORDER — OSELTAMIVIR PHOSPHATE 75 MG PO CAPS
75.0000 mg | ORAL_CAPSULE | Freq: Two times a day (BID) | ORAL | Status: DC
  Administered 2015-06-09 – 2015-06-12 (×7): 75 mg via ORAL
  Filled 2015-06-09 (×12): qty 1

## 2015-06-09 NOTE — Progress Notes (Signed)
PROGRESS NOTE  RAYHANNA LOLLEY [id number] DOB: 11-06-1950 DOA: 06/08/2015 PCP: [name], MD  Brief History 65 year old female with history of COPD, hypertension, hyperlipidemia, IBS, anxiety/depression, hypothyroidism presented with 2-3 day history of nausea, vomiting, diarrhea with associated cough and dyspnea on exertion. The patient had subjective fevers and chills and intermittent headaches. In the emergency department, the patient was noted to have a temperature 102.78F.  The patient was noted to be hypoxemic with oxygen saturation in the 80s. Her lactic acid peaked at 1.12. Intravenous fluids were started. ABG showed 7.404/32/72/20. The patient was started on intravenous steroids and levofloxacin. Assessment/Plan: Influenza -Positive H1N1 -Discontinue levofloxacin -Start oseltamivir--although the patient is outside the 48 hr window, the hope is to decreased complications relating to her influenza since she is sick enough to be hospitalized -Continue intravenous fluids -Continue bronchodilators Acute respiratory failure with hypoxia -Continue bronchodilators -Change intravenous steroids to by mouth -Wean oxygen for saturation greater than 92% -Pulmonary hygiene Fever -Likely secondary to influenza -no fever x 24 hrs -Urinalysis negative for pyuria -MRSA screen Intractable vomiting and diarrhea -Secondary to H1N1 influenza -improved -last emesis 48 hr ago -last BM >24 ago -Symptomatically treatment -continue IVF until taking adequate po Hyponatremia/Dehydration -Secondary to volume depletion from GI loss -Discontinue HCTZ -Continue intravenous fluids Hypertension -Continue amlodipine -Hold HCTZ for now History of stroke -Continue Aggrenox Hypothyroidism -Continue Armour Thyroid Anxiety/depression -Continue Zoloft Hyperglycemia -check A1C    Family Communication:   Pt at beside Disposition Plan:   Home 1-2 days     Procedures/Studies: Dg  Chest Portable 1 View  06/08/2015  CLINICAL DATA:  Patient with cough and shortness of breath. EXAM: PORTABLE CHEST 1 VIEW COMPARISON:  Chest radiograph 01/25/2015. FINDINGS: Monitoring leads overlie the patient. Stable cardiac and mediastinal contours. Minimal heterogeneous opacities within the lung bases bilaterally. No pleural effusion or pneumothorax. IMPRESSION: Bibasilar opacities favored to represent atelectasis. Electronically Signed   By: Lovey Newcomer M.D.   On: 06/08/2015 08:25         Subjective: Patient is breathing better but still has some dyspnea on exertion. Denies any fevers, chills, chest pain, nausea, vomiting, diarrhea, abdominal pain. No dysuria or hematuria. Denies any headache or neck pain.  Objective: Filed Vitals:   06/08/15 1426 06/08/15 1950 06/08/15 2158 06/09/15 0413  BP: 129/95 129/49  124/56  Pulse: 100 80  77  Temp: 97.9 F (36.6 C) 97.8 F (36.6 C)  98.8 F (37.1 C)  TempSrc: Oral Oral  Oral  Resp: 22 21  20   Height:      Weight:      SpO2: 95% 95% 98% 95%   No intake or output data in the 24 hours ending 06/09/15 1118 Weight change:  Exam:   General:  Pt is alert, follows commands appropriately, not in acute distress  HEENT: No icterus, No thrush, No neck mass, [place]/AT  Cardiovascular: RRR, S1/S2, no rubs, no gallops  Respiratory: Bibasilar crackles, right greater than left. No wheezing  Abdomen: Soft/+BS, non tender, non distended, no guarding; no hepatosplenomegaly  Extremities: No edema, No lymphangitis, No petechiae, No rashes, no synovitis; no cyanosis or clubbing  Data Reviewed: Basic Metabolic Panel:  Recent Labs Lab 06/08/15 0758 06/09/15 0548  NA 127* 134*  K 3.6 3.7  CL 96* 102  CO2 20* 23  GLUCOSE 124* 130*  BUN 11 9  CREATININE 0.69 0.60  CALCIUM 8.2* 8.4*   Liver Function Tests:  Recent Labs  Lab 06/08/15 0758  AST 24  ALT 22  ALKPHOS 90  BILITOT 0.4  PROT 6.3*  ALBUMIN 3.4*   No results for input(s):  LIPASE, AMYLASE in the last 168 hours. No results for input(s): AMMONIA in the last 168 hours. CBC:  Recent Labs Lab 06/08/15 0758 06/09/15 0548  WBC 6.2 13.6*  NEUTROABS 4.9  --   HGB 13.0 12.4  HCT 39.3 37.0  MCV 87.3 86.0  PLT 242 244   Cardiac Enzymes: No results for input(s): CKTOTAL, CKMB, CKMBINDEX, TROPONINI in the last 168 hours. BNP: Invalid input(s): POCBNP CBG: No results for input(s): GLUCAP in the last 168 hours.  Recent Results (from the past 240 hour(s))  Urine culture     Status: None   Collection Time: 06/08/15 10:18 AM  Result Value Ref Range Status   Specimen Description URINE, RANDOM  Final   Special Requests NONE  Final   Culture 10,000 COLONIES/mL YEAST  Final   Report Status 06/09/2015 FINAL  Final     Scheduled Meds: . amLODipine  10 mg Oral Daily  . dipyridamole-aspirin  1 capsule Oral BID  . enoxaparin (LOVENOX) injection  40 mg Subcutaneous Q24H  . guaiFENesin  1,200 mg Oral BID  . hydrochlorothiazide  25 mg Oral Daily  . ipratropium-albuterol  3 mL Nebulization TID  . methylPREDNISolone (SOLU-MEDROL) injection  60 mg Intravenous Q24H  . oseltamivir  75 mg Oral BID  . pantoprazole (PROTONIX) IV  40 mg Intravenous Q12H  . sertraline  25 mg Oral Daily  . thyroid  90 mg Oral Daily   Continuous Infusions: . sodium chloride 125 mL/hr at 06/09/15 0740     Deola Rewis, DO  [facility] Pager [phone]  If 7PM-7AM, please contact night-coverage [email or web address] Password [email or web address] 06/09/2015, 11:18 AM

## 2015-06-09 NOTE — Clinical Social Work Note (Signed)
CSW received referral for SNF.  PT is not recommending any follow up.  CSW to sign off please re-consult if social work needs arise.  Jones Broom. Barrera, MSW, [place]

## 2015-06-09 NOTE — Progress Notes (Signed)
Initial Nutrition Assessment  DOCUMENTATION CODES:   Obesity unspecified  INTERVENTION:  Provide Boost Breeze po BID, each supplement provides 250 kcal and 9 grams of protein.  Encourage adequate PO intake.   NUTRITION DIAGNOSIS:   Inadequate oral intake related to acute illness as evidenced by per patient/family report.  GOAL:   Patient will meet greater than or equal to 90% of their needs  MONITOR:   PO intake, Supplement acceptance, Weight trends, I & O's, Labs  REASON FOR ASSESSMENT:   Consult Assessment of nutrition requirement/status  ASSESSMENT:   65 year old female presents complaining of cough, nausea, vomiting, and diarrhea that began 2 days prior to admission. She has had some fever to 100 at home.  States that she's had approximately 15 episodes of nonbloody diarrhea and vomiting since onset. Influenza A (H1N1) positive.  Pt reports having a poor po intake which has been ongoing over the past 3-4 days. Meal completion has been 50-75%. Prior to illness, pt reports eating well with no other difficulties. Per Epic weight records, pt with a 3.5% weight loss in 10 days. Noted, pt dehydrated on admission. Pt continues on IV fluids. Pt is agreeable to nutritional supplements. She reports not liking Ensure. RD to order [facility] Pt was encouraged to eat her food at meals.   Pt with no observed significant fat or muscle mass loss.   Labs and medications reviewed.   Diet Order:  Diet regular Room service appropriate?: Yes; Fluid consistency:: Thin  Skin:  Reviewed, no issues  Last BM:  1/8  Height:   Ht Readings from Last 1 Encounters:  06/08/15 5' (1.524 m)    Weight:   Wt Readings from Last 1 Encounters:  06/08/15 164 lb (74.39 kg)    Ideal Body Weight:  45.45 kg  BMI:  Body mass index is 32.03 kg/(m^2).  Estimated Nutritional Needs:   Kcal:  1700-1900  Protein:  75-90 grams  Fluid:  1.7 - 1.9 L/day  EDUCATION NEEDS:   No education needs  identified at this time  Corrin Parker, MS, RD, LDN Pager # [phone] After hours/ weekend pager # [phone]

## 2015-06-09 NOTE — Progress Notes (Signed)
Occupational Therapy Evaluation/Discharge Patient Details Name: Tiffany Orozco MRN: [id number] DOB: November 18, 1950 Today's Date: 06/09/2015    History of Present Illness 65 y.o. female with nausea, vomiting, and diarrhea admitted with Acute respiratory failure. Influenza A (H1N1) positive.   Clinical Impression   Pt has been evaluated by Occupational Therapy with no acute OT needs identified at this time. Pt completed all functional transfers and ADLs at mod I level and is limited by her flu symptoms. Pt's resting O2 was 92% and during activity was 89% on 2L of supplemental O2. Discussed energy conservation and fall prevention strategies and smoking cessation. Pt with no further questions of acute OT needs. OT signing off. Thank you for this referral.    Follow Up Recommendations  No OT follow up    Equipment Recommendations  None recommended by OT    Recommendations for Other Services       Precautions / Restrictions Precautions Precautions: None Restrictions Weight Bearing Restrictions: No      Mobility Bed Mobility Overal bed mobility: Modified Independent Bed Mobility: Supine to Sit;Sit to Supine     Supine to sit: Modified independent (Device/Increase time) Sit to supine: Modified independent (Device/Increase time)   General bed mobility comments: HOB elevated, no use of bedrails. No physical assist  Transfers Overall transfer level: Modified independent Equipment used: None Transfers: Sit to/from Stand Sit to Stand: Modified independent (Device/Increase time)         General transfer comment: No physical assist required - pt just feel bad from flu symptoms. No LOB    Balance Overall balance assessment: Modified Independent                                          ADL Overall ADL's : Modified independent                                       General ADL Comments: Pt completed transfers, dressing and grooming tasks with  mod I. No LOB or reports of dizziness - pt just feels bad from flu symptoms and very achy from fever. Discussed energy conservation and fall prevention strategies and smoking cessastion.      Vision Vision Assessment?: No apparent visual deficits   Perception     Praxis      Pertinent Vitals/Pain Pain Assessment: 0-10 Pain Score: 3  Pain Location: Everywhere - aching from fever Pain Descriptors / Indicators: Aching Pain Intervention(s): Monitored during session;Limited activity within patient's tolerance;Repositioned;Patient requesting pain meds-RN notified     Hand Dominance Right   Extremity/Trunk Assessment Upper Extremity Assessment Upper Extremity Assessment: Generalized weakness   Lower Extremity Assessment Lower Extremity Assessment: Generalized weakness   Cervical / Trunk Assessment Cervical / Trunk Assessment: Normal   Communication Communication Communication: No difficulties   Cognition Arousal/Alertness: Awake/alert Behavior During Therapy: WFL for tasks assessed/performed Overall Cognitive Status: Within Functional Limits for tasks assessed                     General Comments       Exercises       Shoulder Instructions      Home Living Family/patient expects to be discharged to:: Private residence Living Arrangements: Spouse/significant other;Children Available Help at Discharge: Family;Available 24 hours/day Type of Home: House Home Access: Stairs  to enter Entrance Stairs-Number of Steps: 2 Entrance Stairs-Rails: None Home Layout: One level     Bathroom Shower/Tub: Occupational psychologist: Standard     Home Equipment: Environmental consultant - 2 wheels;Cane - single point;Shower seat - built in          Prior Functioning/Environment Level of Independence: Independent             OT Diagnosis: Generalized weakness   OT Problem List: Decreased strength;Decreased activity tolerance;Decreased safety awareness;Decreased knowledge  of use of DME or AE   OT Treatment/Interventions:      OT Goals(Current goals can be found in the care plan section) Acute Rehab OT Goals Patient Stated Goal: Get well OT Goal Formulation: With patient Time For Goal Achievement: 06/23/15 Potential to Achieve Goals: Good  OT Frequency:     Barriers to D/C:            Co-evaluation              End of Session Equipment Utilized During Treatment: Oxygen Nurse Communication: Mobility status;Other (comment);Patient requests pain meds (O2 sats)  Activity Tolerance: Patient limited by fatigue Patient left: in bed;with call bell/phone within reach   Time: 1132-1201 OT Time Calculation (min): 29 min Charges:  OT General Charges $OT Visit: 1 Procedure OT Evaluation $OT Eval Low Complexity: 1 Procedure OT Treatments $Self Care/Home Management : 8-22 mins G-Codes: OT G-codes **NOT FOR INPATIENT CLASS** Functional Assessment Tool Used: clinical judgement Functional Limitation: Self care Self Care Current Status [id number]): At least 1 percent but less than 20 percent impaired, limited or restricted Self Care Goal Status [id number]): At least 1 percent but less than 20 percent impaired, limited or restricted Self Care Discharge Status [phone]): At least 1 percent but less than 20 percent impaired, limited or restricted  Redmond Baseman, OTR/L Pager: [phone] 06/09/2015, 2:00 PM

## 2015-06-10 ENCOUNTER — Observation Stay (HOSPITAL_COMMUNITY): Payer: Medicare Other

## 2015-06-10 DIAGNOSIS — J441 Chronic obstructive pulmonary disease with (acute) exacerbation: Secondary | ICD-10-CM | POA: Diagnosis not present

## 2015-06-10 DIAGNOSIS — R109 Unspecified abdominal pain: Secondary | ICD-10-CM | POA: Diagnosis not present

## 2015-06-10 DIAGNOSIS — E876 Hypokalemia: Secondary | ICD-10-CM | POA: Diagnosis not present

## 2015-06-10 DIAGNOSIS — R112 Nausea with vomiting, unspecified: Secondary | ICD-10-CM | POA: Diagnosis not present

## 2015-06-10 DIAGNOSIS — Z8673 Personal history of transient ischemic attack (TIA), and cerebral infarction without residual deficits: Secondary | ICD-10-CM | POA: Diagnosis not present

## 2015-06-10 DIAGNOSIS — I248 Other forms of acute ischemic heart disease: Secondary | ICD-10-CM | POA: Diagnosis not present

## 2015-06-10 DIAGNOSIS — Z7902 Long term (current) use of antithrombotics/antiplatelets: Secondary | ICD-10-CM | POA: Diagnosis not present

## 2015-06-10 DIAGNOSIS — J101 Influenza due to other identified influenza virus with other respiratory manifestations: Secondary | ICD-10-CM | POA: Diagnosis not present

## 2015-06-10 DIAGNOSIS — E785 Hyperlipidemia, unspecified: Secondary | ICD-10-CM | POA: Diagnosis present

## 2015-06-10 DIAGNOSIS — E039 Hypothyroidism, unspecified: Secondary | ICD-10-CM | POA: Diagnosis present

## 2015-06-10 DIAGNOSIS — A419 Sepsis, unspecified organism: Secondary | ICD-10-CM | POA: Diagnosis not present

## 2015-06-10 DIAGNOSIS — J9601 Acute respiratory failure with hypoxia: Secondary | ICD-10-CM | POA: Diagnosis not present

## 2015-06-10 DIAGNOSIS — I10 Essential (primary) hypertension: Secondary | ICD-10-CM | POA: Diagnosis present

## 2015-06-10 DIAGNOSIS — K219 Gastro-esophageal reflux disease without esophagitis: Secondary | ICD-10-CM | POA: Diagnosis not present

## 2015-06-10 DIAGNOSIS — J440 Chronic obstructive pulmonary disease with acute lower respiratory infection: Secondary | ICD-10-CM | POA: Diagnosis present

## 2015-06-10 DIAGNOSIS — E871 Hypo-osmolality and hyponatremia: Secondary | ICD-10-CM | POA: Diagnosis not present

## 2015-06-10 DIAGNOSIS — J09X2 Influenza due to identified novel influenza A virus with other respiratory manifestations: Secondary | ICD-10-CM | POA: Diagnosis not present

## 2015-06-10 DIAGNOSIS — J810 Acute pulmonary edema: Secondary | ICD-10-CM | POA: Diagnosis not present

## 2015-06-10 DIAGNOSIS — M8580 Other specified disorders of bone density and structure, unspecified site: Secondary | ICD-10-CM | POA: Diagnosis present

## 2015-06-10 DIAGNOSIS — Z85828 Personal history of other malignant neoplasm of skin: Secondary | ICD-10-CM | POA: Diagnosis not present

## 2015-06-10 DIAGNOSIS — R739 Hyperglycemia, unspecified: Secondary | ICD-10-CM | POA: Diagnosis present

## 2015-06-10 DIAGNOSIS — E7800 Pure hypercholesterolemia, unspecified: Secondary | ICD-10-CM | POA: Diagnosis present

## 2015-06-10 DIAGNOSIS — R197 Diarrhea, unspecified: Secondary | ICD-10-CM

## 2015-06-10 DIAGNOSIS — F419 Anxiety disorder, unspecified: Secondary | ICD-10-CM | POA: Diagnosis present

## 2015-06-10 DIAGNOSIS — I509 Heart failure, unspecified: Secondary | ICD-10-CM | POA: Diagnosis not present

## 2015-06-10 DIAGNOSIS — R0602 Shortness of breath: Secondary | ICD-10-CM | POA: Diagnosis not present

## 2015-06-10 DIAGNOSIS — B370 Candidal stomatitis: Secondary | ICD-10-CM | POA: Diagnosis not present

## 2015-06-10 DIAGNOSIS — R509 Fever, unspecified: Secondary | ICD-10-CM | POA: Diagnosis not present

## 2015-06-10 DIAGNOSIS — R319 Hematuria, unspecified: Secondary | ICD-10-CM | POA: Diagnosis present

## 2015-06-10 DIAGNOSIS — K580 Irritable bowel syndrome with diarrhea: Secondary | ICD-10-CM | POA: Diagnosis present

## 2015-06-10 DIAGNOSIS — F329 Major depressive disorder, single episode, unspecified: Secondary | ICD-10-CM | POA: Diagnosis present

## 2015-06-10 DIAGNOSIS — J189 Pneumonia, unspecified organism: Secondary | ICD-10-CM | POA: Diagnosis not present

## 2015-06-10 DIAGNOSIS — Z79899 Other long term (current) drug therapy: Secondary | ICD-10-CM | POA: Diagnosis not present

## 2015-06-10 DIAGNOSIS — F17210 Nicotine dependence, cigarettes, uncomplicated: Secondary | ICD-10-CM | POA: Diagnosis present

## 2015-06-10 DIAGNOSIS — E860 Dehydration: Secondary | ICD-10-CM | POA: Diagnosis present

## 2015-06-10 LAB — BASIC METABOLIC PANEL
Anion gap: 6 (ref 5–15)
BUN: 9 mg/dL (ref 6–20)
CO2: 26 mmol/L (ref 22–32)
Calcium: 7.8 mg/dL — ABNORMAL LOW (ref 8.9–10.3)
Chloride: 100 mmol/L — ABNORMAL LOW (ref 101–111)
Creatinine, Ser: 0.75 mg/dL (ref 0.44–1.00)
GFR calc Af Amer: >60 mL/min (ref >60)
GFR calc non Af Amer: >60 mL/min (ref >60)
Glucose, Bld: 99 mg/dL (ref 65–99)
Potassium: 3.4 mmol/L — ABNORMAL LOW (ref 3.5–5.1)
Sodium: 132 mmol/L — ABNORMAL LOW (ref 135–145)

## 2015-06-10 LAB — CBC
HCT: 40.5 % (ref 36.0–46.0)
Hemoglobin: 13.8 g/dL (ref 12.0–15.0)
MCH: 29.1 pg (ref 26.0–34.0)
MCHC: 34.1 g/dL (ref 30.0–36.0)
MCV: 85.4 fL (ref 78.0–100.0)
Platelets: 273 10*3/uL (ref 150–400)
RBC: 4.74 MIL/uL (ref 3.87–5.11)
RDW: 14.7 % (ref 11.5–15.5)
WBC: 15.9 10*3/uL — ABNORMAL HIGH (ref 4.0–10.5)

## 2015-06-10 LAB — BLOOD GAS, ARTERIAL
Acid-base deficit: 0.6 mmol/L (ref 0.0–2.0)
Bicarbonate: 22.3 mEq/L (ref 20.0–24.0)
Drawn by: 242311
FIO2: 0.5
O2 Saturation: 85.4 %
Patient temperature: 98.6
TCO2: 23.2 mmol/L (ref 0–100)
pCO2 arterial: 29.1 mmHg — ABNORMAL LOW (ref 35.0–45.0)
pH, Arterial: 7.495 — ABNORMAL HIGH (ref 7.350–7.450)
pO2, Arterial: 48.8 mmHg — ABNORMAL LOW (ref 80.0–100.0)

## 2015-06-10 LAB — LACTIC ACID, PLASMA: Lactic Acid, Venous: 1.4 mmol/L (ref 0.5–2.0)

## 2015-06-10 LAB — BRAIN NATRIURETIC PEPTIDE: B Natriuretic Peptide: 118.1 pg/mL — ABNORMAL HIGH (ref 0.0–100.0)

## 2015-06-10 LAB — PROCALCITONIN: Procalcitonin: 0.49 ng/mL

## 2015-06-10 LAB — TROPONIN I: Troponin I: 0.03 ng/mL (ref <0.031)

## 2015-06-10 MED ORDER — FUROSEMIDE 10 MG/ML IJ SOLN
40.0000 mg | Freq: Once | INTRAMUSCULAR | Status: AC
  Administered 2015-06-10: 40 mg via INTRAVENOUS

## 2015-06-10 MED ORDER — PANTOPRAZOLE SODIUM 40 MG PO TBEC
40.0000 mg | DELAYED_RELEASE_TABLET | Freq: Two times a day (BID) | ORAL | Status: DC
  Administered 2015-06-10 – 2015-06-13 (×6): 40 mg via ORAL
  Filled 2015-06-10 (×6): qty 1

## 2015-06-10 MED ORDER — FUROSEMIDE 10 MG/ML IJ SOLN
40.0000 mg | Freq: Two times a day (BID) | INTRAMUSCULAR | Status: DC
  Administered 2015-06-10 – 2015-06-11 (×2): 40 mg via INTRAVENOUS
  Filled 2015-06-10 (×2): qty 4

## 2015-06-10 MED ORDER — FUROSEMIDE 10 MG/ML IJ SOLN
INTRAMUSCULAR | Status: AC
  Filled 2015-06-10: qty 4

## 2015-06-10 NOTE — Significant Event (Signed)
Rapid Response Event Note  Overview: Time Called: [id number] Arrival Time: [id number] Event Type: Respiratory  Initial Focused Assessment:  Called by RN for patient with respiratory distress and needing SDU.  Upon my arrival to patients room, RN, MD and family at bedside.  Patient is in bed with 4 LPM nasal cannula on, SPo2 81%.  Oxygen increased to 6 LPM sats increased to 87%.  Placed on ventimask 50%.  HR 95, 142/59, RR 34.  On vm 50% O2 sats 90%  Interventions:  Dr. Carles Collet at bedside, [facility] done, ABG done.  Lasix given by primary RN.  ABG results 7.49, Co2 29.1, O2 48.8, hco3 22.3 on 50% VM-results given to Dr. Carles Collet   Event Summary:  Patient to transport to SDU   at      at          [facility], [place]

## 2015-06-10 NOTE — Progress Notes (Signed)
PT Cancellation Note  Patient Details Name: Tiffany Orozco MRN: [id number] DOB: Mar 24, 1951   Cancelled Treatment:    Reason Eval/Treat Not Completed: Medical issues which prohibited therapy RN reported that pt is not medically stable for therapy and is transferring to step down unit when bed is available.   Salina April, PTA Pager: [phone]   06/10/2015, 1:22 PM

## 2015-06-10 NOTE — Progress Notes (Signed)
Patient arrived to 2C05. Venimask in place. Denies pain or discomfort at this time. CHG bath completed. Husband at bedside.

## 2015-06-10 NOTE — Progress Notes (Signed)
PROGRESS NOTE  Tiffany Orozco [id number] DOB: 29-Nov-1950 DOA: 06/08/2015 PCP: [name], MD  Brief History 65 year old female with history of COPD, hypertension, hyperlipidemia, IBS, anxiety/depression, hypothyroidism presented with 2-3 day history of nausea, vomiting, diarrhea with associated cough and dyspnea on exertion. The patient had subjective fevers and chills and intermittent headaches. In the emergency department, the patient was noted to have a temperature 102.22F. The patient was noted to be hypoxemic with oxygen saturation in the 80s. Her lactic acid peaked at 1.12. Intravenous fluids were started. ABG showed 7.404/32/72/20. The patient was started on intravenous steroids and levofloxacin. On the morning of 06/10/2015, the patient developed respiratory distress secondary to pulmonary edema. She was moved to the stepdown unit. Assessment/Plan: Influenza -Positive H1N1 -Discontinue levofloxacin -Start oseltamivir D#2--although the patient is outside the 48 hr window, the hope is to decreased complications relating to her influenza since she is sick enough to be hospitalized -Continue intravenous fluids-->saline lock (see below) -Continue bronchodilators Acute respiratory failure with hypoxia -secondary to pulmonary edema and influenza in the setting of COPD -06/10/2015--patient developed respiratory distress--7.49/29/48/22 (0.5x 5 min) -1/101/7--CXR--diffuse infiltrates--pulmonary edema vs. ARDS -saline lock IVF -IV lasix -Continue bronchodilators -Change intravenous steroids to by mouth -Wean oxygen for saturation greater than 92% -Pulmonary hygiene -Check lactic acid -Repeat blood culture  Pulmonary edema/diffuse pulmonary infiltrates -Likely fluid overload, although cannot rule out ARDS secondary to influenza  -Intravenous furosemide  -Transfer to stepdown  -check BNP and procalcitonin Atypical chest pain  -EKG --sinus rhythm, nonspecific ST-T wave  changes  -Cycle troponins  Fever -Likely secondary to influenza -no fever x 48 hrs -Urinalysis negative for pyuria -MRSA screen--neg Intractable vomiting and diarrhea -Secondary to H1N1 influenza -improved -last emesis 48 hr ago -check cdiff -Symptomatically treatment -continue IVF until taking adequate po Hyponatremia/Dehydration -Secondary to volume depletion from GI loss -Discontinue HCTZ -06/10/15--now appears fluid overloaded-->saline lock Hypertension -Continue amlodipine -Hold HCTZ for now History of stroke -Continue Aggrenox Hypothyroidism -Continue Armour Thyroid Anxiety/depression -Continue Zoloft Hyperglycemia -check A1C--pending  Family--husband updated at bedside 06/10/15 Dispo--transfer to stepdown  Procedures/Studies: Dg Chest Port 1 View  06/10/2015  CLINICAL DATA:  Acute respiratory failure with hypoxia EXAM: PORTABLE CHEST - 1 VIEW COMPARISON:  06/08/2015 FINDINGS: Cardiac shadow is stable. Diffuse bilateral infiltrates are now seen new from the prior exam. This likely represents congestive failure and pulmonary edema. No sizable effusion is noted. No bony abnormality is seen. IMPRESSION: Diffuse bilateral infiltrates likely related to a pulmonary edema and congestive failure. Electronically Signed   By: Inez Catalina M.D.   On: 06/10/2015 12:01   Dg Chest Portable 1 View  06/08/2015  CLINICAL DATA:  Patient with cough and shortness of breath. EXAM: PORTABLE CHEST 1 VIEW COMPARISON:  Chest radiograph 01/25/2015. FINDINGS: Monitoring leads overlie the patient. Stable cardiac and mediastinal contours. Minimal heterogeneous opacities within the lung bases bilaterally. No pleural effusion or pneumothorax. IMPRESSION: Bibasilar opacities favored to represent atelectasis. Electronically Signed   By: Lovey Newcomer M.D.   On: 06/08/2015 08:25        Subjective:  patient complains of chest pain or short of breath. Denies any vomiting but had numerous episodes of  diarrhea. Denies any fevers, chills, hematochezia, melena. Denies any headache or rashes.   Objective: Filed Vitals:   06/09/15 1930 06/10/15 0518 06/10/15 0937 06/10/15 1213  BP: 132/59 138/68  136/57  Pulse: 88 89  99  Temp: 98.9 F (37.2 C) 98.9 F (37.2  C)  98.4 F (36.9 C)  TempSrc: Oral   Oral  Resp: 19 20  20   Height:      Weight:      SpO2: 97% 98% 92% 89%    Intake/Output Summary (Last 24 hours) at 06/10/15 1225 Last data filed at 06/10/15 0700  Gross per 24 hour  Intake   1440 ml  Output      0 ml  Net   1440 ml   Weight change:  Exam:   General:  Pt is alert, follows commands appropriately, not in acute distress  HEENT: No icterus, No thrush, No neck mass, [place]/AT  Cardiovascular: RRR, S1/S2, no rubs, no gallops  Respiratory: bilateral crackles. No wheeze. Good air movement   Abdomen: Soft/+BS, mild diffuse tenderness without rebound, non distended, no guarding  Extremities:trace LE edema, No lymphangitis, No petechiae, No rashes, no synovitis  Data Reviewed: Basic Metabolic Panel:  Recent Labs Lab 06/08/15 0758 06/09/15 0548 06/10/15 0440  NA 127* 134* 132*  K 3.6 3.7 3.4*  CL 96* 102 100*  CO2 20* 23 26  GLUCOSE 124* 130* 99  BUN 11 9 9   CREATININE 0.69 0.60 0.75  CALCIUM 8.2* 8.4* 7.8*   Liver Function Tests:  Recent Labs Lab 06/08/15 0758  AST 24  ALT 22  ALKPHOS 90  BILITOT 0.4  PROT 6.3*  ALBUMIN 3.4*   No results for input(s): LIPASE, AMYLASE in the last 168 hours. No results for input(s): AMMONIA in the last 168 hours. CBC:  Recent Labs Lab 06/08/15 0758 06/09/15 0548  WBC 6.2 13.6*  NEUTROABS 4.9  --   HGB 13.0 12.4  HCT 39.3 37.0  MCV 87.3 86.0  PLT 242 244   Cardiac Enzymes: No results for input(s): CKTOTAL, CKMB, CKMBINDEX, TROPONINI in the last 168 hours. BNP: Invalid input(s): POCBNP CBG: No results for input(s): GLUCAP in the last 168 hours.  Recent Results (from the past 240 hour(s))  Urine culture      Status: None   Collection Time: 06/08/15 10:18 AM  Result Value Ref Range Status   Specimen Description URINE, RANDOM  Final   Special Requests NONE  Final   Culture 10,000 COLONIES/mL YEAST  Final   Report Status 06/09/2015 FINAL  Final  MRSA PCR Screening     Status: None   Collection Time: 06/09/15 12:17 PM  Result Value Ref Range Status   MRSA by PCR NEGATIVE NEGATIVE Final    Comment:        The GeneXpert MRSA Assay (FDA approved for NASAL specimens only), is one component of a comprehensive MRSA colonization surveillance program. It is not intended to diagnose MRSA infection nor to guide or monitor treatment for MRSA infections.      Scheduled Meds: . amLODipine  10 mg Oral Daily  . dipyridamole-aspirin  1 capsule Oral BID  . enoxaparin (LOVENOX) injection  40 mg Subcutaneous Q24H  . feeding supplement  1 Container Oral BID BM  . furosemide      . furosemide  40 mg Intravenous BID  . guaiFENesin  1,200 mg Oral BID  . ipratropium-albuterol  3 mL Nebulization TID  . oseltamivir  75 mg Oral BID  . pantoprazole  40 mg Oral BID  . predniSONE  50 mg Oral Q breakfast  . sertraline  25 mg Oral Daily  . thyroid  90 mg Oral Daily   Continuous Infusions:    Donesha Wallander, DO  [facility] Pager [phone]  If 7PM-7AM, please  contact night-coverage [email or web address] Password [email or web address] 06/10/2015, 12:25 PM

## 2015-06-11 ENCOUNTER — Observation Stay (HOSPITAL_COMMUNITY): Payer: Medicare Other

## 2015-06-11 ENCOUNTER — Ambulatory Visit (HOSPITAL_COMMUNITY): Payer: Medicare Other

## 2015-06-11 DIAGNOSIS — J189 Pneumonia, unspecified organism: Secondary | ICD-10-CM | POA: Insufficient documentation

## 2015-06-11 DIAGNOSIS — R0602 Shortness of breath: Secondary | ICD-10-CM | POA: Diagnosis not present

## 2015-06-11 DIAGNOSIS — R109 Unspecified abdominal pain: Secondary | ICD-10-CM | POA: Diagnosis not present

## 2015-06-11 DIAGNOSIS — J441 Chronic obstructive pulmonary disease with (acute) exacerbation: Secondary | ICD-10-CM

## 2015-06-11 DIAGNOSIS — J101 Influenza due to other identified influenza virus with other respiratory manifestations: Secondary | ICD-10-CM

## 2015-06-11 DIAGNOSIS — I509 Heart failure, unspecified: Secondary | ICD-10-CM

## 2015-06-11 DIAGNOSIS — J9601 Acute respiratory failure with hypoxia: Secondary | ICD-10-CM

## 2015-06-11 LAB — BASIC METABOLIC PANEL
Anion gap: 13 (ref 5–15)
BUN: 9 mg/dL (ref 6–20)
CO2: 24 mmol/L (ref 22–32)
Calcium: 8.2 mg/dL — ABNORMAL LOW (ref 8.9–10.3)
Chloride: 94 mmol/L — ABNORMAL LOW (ref 101–111)
Creatinine, Ser: 0.64 mg/dL (ref 0.44–1.00)
GFR calc Af Amer: >60 mL/min (ref >60)
GFR calc non Af Amer: >60 mL/min (ref >60)
Glucose, Bld: 91 mg/dL (ref 65–99)
Potassium: 2.8 mmol/L — ABNORMAL LOW (ref 3.5–5.1)
Sodium: 131 mmol/L — ABNORMAL LOW (ref 135–145)

## 2015-06-11 LAB — BLOOD GAS, ARTERIAL
Acid-base deficit: 1 mmol/L (ref 0.0–2.0)
Bicarbonate: 22.4 mEq/L (ref 20.0–24.0)
FIO2: 1
O2 Content: 15 L/min
O2 Saturation: 97.7 %
Patient temperature: 98.6
TCO2: 23.4 mmol/L (ref 0–100)
pCO2 arterial: 32.2 mmHg — ABNORMAL LOW (ref 35.0–45.0)
pH, Arterial: 7.457 — ABNORMAL HIGH (ref 7.350–7.450)
pO2, Arterial: 117 mmHg — ABNORMAL HIGH (ref 80.0–100.0)

## 2015-06-11 LAB — CBC
HCT: 37.8 % (ref 36.0–46.0)
Hemoglobin: 13.1 g/dL (ref 12.0–15.0)
MCH: 29.4 pg (ref 26.0–34.0)
MCHC: 34.7 g/dL (ref 30.0–36.0)
MCV: 84.9 fL (ref 78.0–100.0)
Platelets: 270 10*3/uL (ref 150–400)
RBC: 4.45 MIL/uL (ref 3.87–5.11)
RDW: 14.6 % (ref 11.5–15.5)
WBC: 16.9 10*3/uL — ABNORMAL HIGH (ref 4.0–10.5)

## 2015-06-11 LAB — C DIFFICILE QUICK SCREEN W PCR REFLEX
C Diff antigen: NEGATIVE
C Diff interpretation: NEGATIVE
C Diff toxin: NEGATIVE

## 2015-06-11 LAB — POTASSIUM: Potassium: 3.9 mmol/L (ref 3.5–5.1)

## 2015-06-11 LAB — GLUCOSE, CAPILLARY: Glucose-Capillary: 150 mg/dL — ABNORMAL HIGH (ref 65–99)

## 2015-06-11 LAB — TROPONIN I
Troponin I: 0.06 ng/mL — ABNORMAL HIGH (ref <0.031)
Troponin I: 0.09 ng/mL — ABNORMAL HIGH (ref <0.031)

## 2015-06-11 LAB — BRAIN NATRIURETIC PEPTIDE: B Natriuretic Peptide: 75.8 pg/mL (ref 0.0–100.0)

## 2015-06-11 LAB — STREP PNEUMONIAE URINARY ANTIGEN: Strep Pneumo Urinary Antigen: NEGATIVE

## 2015-06-11 MED ORDER — MAGNESIUM SULFATE IN D5W 10-5 MG/ML-% IV SOLN
1.0000 g | Freq: Once | INTRAVENOUS | Status: AC
  Administered 2015-06-11: 1 g via INTRAVENOUS
  Filled 2015-06-11: qty 100

## 2015-06-11 MED ORDER — POTASSIUM CHLORIDE CRYS ER 20 MEQ PO TBCR
40.0000 meq | EXTENDED_RELEASE_TABLET | ORAL | Status: AC
  Administered 2015-06-11: 40 meq via ORAL
  Filled 2015-06-11: qty 2

## 2015-06-11 MED ORDER — ONDANSETRON HCL 4 MG/2ML IJ SOLN
4.0000 mg | Freq: Four times a day (QID) | INTRAMUSCULAR | Status: DC | PRN
  Administered 2015-06-11 – 2015-06-15 (×8): 4 mg via INTRAVENOUS
  Filled 2015-06-11 (×8): qty 2

## 2015-06-11 MED ORDER — MORPHINE SULFATE (PF) 2 MG/ML IV SOLN
2.0000 mg | INTRAVENOUS | Status: DC | PRN
  Administered 2015-06-11 (×2): 2 mg via INTRAVENOUS
  Filled 2015-06-11 (×5): qty 1

## 2015-06-11 MED ORDER — ASPIRIN EC 325 MG PO TBEC
325.0000 mg | DELAYED_RELEASE_TABLET | Freq: Every day | ORAL | Status: DC
  Administered 2015-06-11 – 2015-06-12 (×2): 325 mg via ORAL
  Filled 2015-06-11 (×2): qty 1

## 2015-06-11 MED ORDER — METHYLPREDNISOLONE SODIUM SUCC 40 MG IJ SOLR
40.0000 mg | Freq: Two times a day (BID) | INTRAMUSCULAR | Status: DC
  Administered 2015-06-11 – 2015-06-12 (×4): 40 mg via INTRAVENOUS
  Filled 2015-06-11 (×6): qty 1

## 2015-06-11 MED ORDER — NEBIVOLOL HCL 2.5 MG PO TABS
2.5000 mg | ORAL_TABLET | Freq: Every day | ORAL | Status: DC
  Filled 2015-06-11: qty 1

## 2015-06-11 MED ORDER — DEXTROSE 5 % IV SOLN
1.0000 g | INTRAVENOUS | Status: DC
  Filled 2015-06-11: qty 10

## 2015-06-11 MED ORDER — CETYLPYRIDINIUM CHLORIDE 0.05 % MT LIQD
7.0000 mL | Freq: Two times a day (BID) | OROMUCOSAL | Status: DC
  Administered 2015-06-11 – 2015-06-16 (×10): 7 mL via OROMUCOSAL

## 2015-06-11 MED ORDER — POTASSIUM CHLORIDE CRYS ER 20 MEQ PO TBCR
30.0000 meq | EXTENDED_RELEASE_TABLET | ORAL | Status: DC
  Administered 2015-06-11: 30 meq via ORAL
  Filled 2015-06-11: qty 2

## 2015-06-11 MED ORDER — SODIUM CHLORIDE 0.9 % IV SOLN
INTRAVENOUS | Status: AC
  Administered 2015-06-11: 10:00:00 via INTRAVENOUS

## 2015-06-11 MED ORDER — AMLODIPINE BESYLATE 5 MG PO TABS
5.0000 mg | ORAL_TABLET | Freq: Every day | ORAL | Status: DC
  Administered 2015-06-11: 5 mg via ORAL
  Filled 2015-06-11: qty 1

## 2015-06-11 MED ORDER — LEVOFLOXACIN IN D5W 750 MG/150ML IV SOLN
750.0000 mg | INTRAVENOUS | Status: DC
  Administered 2015-06-11: 750 mg via INTRAVENOUS
  Filled 2015-06-11 (×2): qty 150

## 2015-06-11 MED ORDER — POTASSIUM CHLORIDE 10 MEQ/100ML IV SOLN
10.0000 meq | INTRAVENOUS | Status: AC
  Administered 2015-06-11 (×4): 10 meq via INTRAVENOUS
  Filled 2015-06-11 (×4): qty 100

## 2015-06-11 MED ORDER — FUROSEMIDE 10 MG/ML IJ SOLN
40.0000 mg | Freq: Once | INTRAMUSCULAR | Status: AC
  Administered 2015-06-11: 40 mg via INTRAVENOUS
  Filled 2015-06-11: qty 4

## 2015-06-11 MED ORDER — DEXTROSE 5 % IV SOLN
500.0000 mg | INTRAVENOUS | Status: DC
  Filled 2015-06-11: qty 500

## 2015-06-11 NOTE — Progress Notes (Signed)
  Echocardiogram 2D Echocardiogram has been performed.  Darlina Sicilian M 06/11/2015, 9:40 AM

## 2015-06-11 NOTE — Progress Notes (Signed)
PT Cancellation Note  Patient Details Name: Tiffany Orozco MRN: [id number] DOB: [birth year]   Cancelled Treatment:    Reason Eval/Treat Not Completed: Medical issues which prohibited therapy (order received to d/c PT.  Please reorder if pt status changes.)  Thanks.    Irwin Brakeman F 06/11/2015, 12:19 PM  Electra Antwain Caliendo,PT Acute Rehabilitation [phone] (pager)

## 2015-06-11 NOTE — Progress Notes (Addendum)
Pharmacy: Rocephin for PNA Wt 74.4 kg. Plan: rocephin 1 gm IV q24 Pharmacy to sign off Thanks  Eudelia Bunch, Pharm.D. [phone] 06/11/2015 11:31 AM   Addendum: Abx changed from CTX/Axith to [facility] for sepsis from fluA/H1H1 and possible bacterial superinfection in pt w/ allergy to PCN ( only GI distress w/ augmentin)  Plan: levaquin 750 mg IV q24 - change to PO when able Pharmacy to sign off Thanks  Eudelia Bunch, Pharm.D. [phone] 06/11/2015 12:07 PM

## 2015-06-11 NOTE — Consult Note (Signed)
PULMONARY / CRITICAL CARE MEDICINE   Name: Tiffany Orozco MRN: [id number] DOB: 29-Sep-1950    ADMISSION DATE:  06/08/2015 CONSULTATION DATE:  1/11  [facility]  CHIEF COMPLAINT:  Hypoxia  HISTORY OF PRESENT ILLNESS:   65 yo female smoker developed cough, fever, and shortness of breath for past 3 to 4 days.  She has progressive hypoxia, and transfer to ICU.  PCR was positive for Flu A/H1N1.  Note she has GERD and esophogeal stenosis, CVA.  She was tried on BiPAP but developed nausea.  She c/o diarrhea.  She denies chest pain, skin rash, sinus congestion.  She does have a headache and feels fatigued.  PAST MEDICAL HISTORY :  She  has a past medical history of Anxiety; Depression; GERD (gastroesophageal reflux disease); Hypothyroidism; Skin cancer of face; Skin cancer of arm; Allergy; COPD (chronic obstructive pulmonary disease) ([place]); Hyperlipidemia; Hypertension; Obesity; DDD (degenerative disc disease), lumbar; Stroke ([place]) (07/2010); Osteopenia; Heart murmur; Thrombocytopenia ([place]); Female rectocele without uterine prolapse; IBS (irritable bowel syndrome); and Influenza A (06/2015).  PAST SURGICAL HISTORY: She  has past surgical history that includes Tubal ligation (1975); Appendectomy; Foot surgery (03/31/2009); EMGs (7/04); Cholecystectomy (2004); Foot surgery (2008, 2009, 2010); and Esophageal manometry (N/A, 03/17/2015).  Allergies  Allergen Reactions  . Lisinopril     Shortness of breath.  . Niacin     REACTION: dizziness  . Welchol [Colesevelam Hcl] Other (See Comments)    Diarrhea,Nausea, swelling in face  . Augmentin [Amoxicillin-Pot Clavulanate]     Nauseated. GI upset with taking.  . Codeine     keeps patient awake  . Oxycodone Hcl     Patient states this medication makes her hyper    No current facility-administered medications on file prior to encounter.   Current Outpatient Prescriptions on File Prior to Encounter  Medication Sig  . amLODipine (NORVASC) 10 MG tablet TAKE  1 TABLET (10 MG TOTAL) BY MOUTH DAILY.  Marland Kitchen ARMOUR THYROID 90 MG tablet TAKE 1 TABLET (90 MG TOTAL) BY MOUTH DAILY.  Marland Kitchen Aspirin-Salicylamide-Caffeine (BC HEADACHE POWDER PO) Take 1 packet by mouth every 8 (eight) hours as needed (pain).  . cetirizine (ZYRTEC) 10 MG tablet Take 10 mg by mouth daily.  . Cholecalciferol (VITAMIN D-3) 1000 UNITS CAPS Take 2,000 Units by mouth daily.  Marland Kitchen dipyridamole-aspirin (AGGRENOX) 200-25 MG 12hr capsule Take 1 capsule by mouth 2 (two) times daily.  Marland Kitchen ezetimibe (ZETIA) 10 MG tablet Take 1 tablet (10 mg total) by mouth daily.  . hydrochlorothiazide (HYDRODIURIL) 25 MG tablet Take 1 tablet (25 mg total) by mouth daily.  . RABEprazole (ACIPHEX) 20 MG tablet Take 1 tablet (20 mg total) by mouth 2 (two) times daily.  . rifaximin (XIFAXAN) 550 MG TABS tablet Take 1 tablet (550 mg total) by mouth 3 (three) times daily. (Patient taking differently: Take 550 mg by mouth 3 (three) times daily as needed. For diarrhea)  . sertraline (ZOLOFT) 50 MG tablet Take 0.5 tablets (25 mg total) by mouth daily.  . ondansetron (ZOFRAN) 4 MG tablet Take 1 tablet (4 mg total) by mouth every 8 (eight) hours as needed for nausea.  . polyethylene glycol powder (GLYCOLAX/MIRALAX) powder 17 grams daily prn constipation. (Patient taking differently: Take 1 Container by mouth daily as needed for mild constipation. )    FAMILY HISTORY:  Her indicated that her mother is deceased. She indicated that her father is deceased.   SOCIAL HISTORY: She  reports that she has been smoking Cigarettes.  She has a  31 pack-year smoking history. She has never used smokeless tobacco. She reports that she does not drink alcohol or use illicit drugs.  REVIEW OF SYSTEMS:   10 point review of system taken, please see HPI for positives and negatives.   SUBJECTIVE:  Moderated resp distress  VITAL SIGNS: BP 118/62 mmHg  Pulse 81  Temp(Src) 97.1 F (36.2 C) (Axillary)  Resp 21  Ht 5' (1.524 m)  Wt 164 lb (74.39  kg)  BMI 32.03 kg/m2  SpO2 100%  HEMODYNAMICS:    VENTILATOR SETTINGS: Vent Mode:  [-]  FiO2 (%):  [55 %-100 %] 100 %  INTAKE / OUTPUT: I/O last 3 completed shifts: In: 1200 [I.V.:1200] Out: 1750 [Urine:1750]  PHYSICAL EXAMINATION: General:  WNWF on 70% nrb Neuro:  Awake and alert, MAEx 4. REPORTS COUGHING ON FOOD FOR A YEAR HEENT: 70% nrb, no jvd/lan Cardiovascular: HSR RRR Lungs:Decreased air movement, exp wheeze Abdomen: Soft + bs Musculoskeletal: intact Skin: warm. No edema  LABS:  BMET  Recent Labs Lab 06/09/15 0548 06/10/15 0440 06/11/15 0256  NA 134* 132* 131*  K 3.7 3.4* 2.8*  CL 102 100* 94*  CO2 23 26 24   BUN 9 9 9   CREATININE 0.60 0.75 0.64  GLUCOSE 130* 99 91    Electrolytes  Recent Labs Lab 06/09/15 0548 06/10/15 0440 06/11/15 0256  CALCIUM 8.4* 7.8* 8.2*    CBC  Recent Labs Lab 06/09/15 0548 06/10/15 1230 06/11/15 0256  WBC 13.6* 15.9* 16.9*  HGB 12.4 13.8 13.1  HCT 37.0 40.5 37.8  PLT 244 273 270    Coag's No results for input(s): APTT, INR in the last 168 hours.  Sepsis Markers  Recent Labs Lab 06/08/15 0751 06/08/15 1041 06/10/15 1230  LATICACIDVEN 0.66 1.12 1.4  PROCALCITON  --   --  0.49    ABG  Recent Labs Lab 06/08/15 1338 06/10/15 1155  PHART 7.404 7.495*  PCO2ART 32.1* 29.1*  PO2ART 72.0* 48.8*    Liver Enzymes  Recent Labs Lab 06/08/15 0758  AST 24  ALT 22  ALKPHOS 90  BILITOT 0.4  ALBUMIN 3.4*    Cardiac Enzymes  Recent Labs Lab 06/10/15 1230 06/11/15 0031 06/11/15 0256  TROPONINI 0.03 0.06* 0.09*    Glucose No results for input(s): GLUCAP in the last 168 hours.  Imaging Dg Chest Port 1 View  06/11/2015  CLINICAL DATA:  Shortness of breath, mid abdominal pain EXAM: PORTABLE CHEST 1 VIEW COMPARISON:  06/10/2015 FINDINGS: There is diffuse bilateral interstitial thickening and alveolar airspace opacities. There is no pleural effusion or pneumothorax. There is stable  cardiomegaly. There is no acute osseous abnormality. IMPRESSION: Bilateral diffuse interstitial and alveolar airspace opacities. Differential diagnosis includes pulmonary edema versus multilobar pneumonia. Electronically Signed   By: Kathreen Devoid   On: 06/11/2015 08:24   Dg Chest Port 1 View  06/10/2015  CLINICAL DATA:  Acute respiratory failure with hypoxia EXAM: PORTABLE CHEST - 1 VIEW COMPARISON:  06/08/2015 FINDINGS: Cardiac shadow is stable. Diffuse bilateral infiltrates are now seen new from the prior exam. This likely represents congestive failure and pulmonary edema. No sizable effusion is noted. No bony abnormality is seen. IMPRESSION: Diffuse bilateral infiltrates likely related to a pulmonary edema and congestive failure. Electronically Signed   By: Inez Catalina M.D.   On: 06/10/2015 12:01   Dg Abd Portable 1v  06/11/2015  CLINICAL DATA:  Shortness of breath, mid abdominal pain EXAM: PORTABLE ABDOMEN - 1 VIEW COMPARISON:  None. FINDINGS: Prior scratch at there  is evidence prior cholecystectomy. There is no bowel dilatation to suggest obstruction. There is no evidence of pneumoperitoneum, portal venous gas or pneumatosis. There are no pathologic calcifications along the expected course of the ureters. There is a dextrocurvature of the thoracolumbar spine. IMPRESSION: Negative. Electronically Signed   By: Kathreen Devoid   On: 06/11/2015 08:25     STUDIES:  1/11 Echo >> EF 60 to 65%, mild LVH  CULTURES: 1/9 Influenza PCR >> Flu A/H1N1 1/10 Blood >> 1/11 C diff >> negative 1/11 Pneumococcal Ag >> 1/11 Legionella Ag >>  ANTIBIOTICS: 1/9 tamiflu >> 1/11 Levaquin >>   SIGNIFICANT EVENTS: 1/08 Admit 1/11 To ICU  LINES/TUBES:   DISCUSSION: 65 yo female smoker with FluA/H1N1 PNA and acute hypoxic respiratory failure.  She is not able to tolerate BiPAP, and transferred to ICU to monitor need for intubation.  ASSESSMENT / PLAN:  PULMONARY A: Acute hypoxic respiratory failure 2nd to  Flu A/H1N1 PNA. Possible component of aspiration pneumonitis. AECOPD. Tobacco abuse. P:   Move to ICU F/u CXR Oxygen to keep SpO2 > 92% Might need intubation Scheduled BDs Change prednisone to solu medrol  CARDIOVASCULAR A:  Hx of HTN, HLD. P:  Monitor hemodynamics Hold BP meds for now  RENAL A:   Hypokalemia. P:   Replete K Check Mg/Phos  GASTROINTESTINAL A:   Hx of gerd. Esophogeal stenosis recent dilatation by Delfin Edis. P:   PPI Clear liquid diet  HEMATOLOGIC A:   No acute issue. P:  F/u CBC Lovenox for DVT prevention  INFECTIOUS A:   Sepsis from Flu A/H1N1 and possible bacterial superinfection. P:   Continue tamiflu Add levaquin >> allergy to PCN Check urine legionella/pneumoccoal ag  ENDOCRINE A:   Hx of Hypothroidism. P:   Continue armour thyroid  NEUROLOGIC A:   History of stroke, depression. P:   Monitor mental status Continue zoloft, ASA   FAMILY  - Updates: Updated pts husband at bedside.  - Inter-disciplinary family meet or Palliative Care meeting due by:  day 7   [facility] ACNP Maryanna Shape PCCM Pager [phone] till 3 pm If no answer page [phone] 06/11/2015, 9:37 AM  Reviewed above examined.  She has respiratory failure from flu pneumonia and possible bacterial superinfection.  Not able to tolerate BiPAP.  Feels weak, short of breath.  Fatigued looking.  No sinus tenderness.  B/l crackles.  HR regular and tachy.  Abd soft.  No rashes or edema.  CXR with b/l interstitial infiltrates.  Transfer to ICU.  Continue tamiflu.  Add levaquin.  Monitor need for intubation.  Updated pts husband at bedside.  CC time by me independent of APP time 42 minutes.  Chesley Mires, MD [facility] Pulmonary/Critical Care 06/11/2015, 12:04 PM Pager:  [phone] After 3pm call: [phone]

## 2015-06-11 NOTE — Progress Notes (Signed)
Pt placed on CPAP.  Did not tolerate well.  Pt complained of nausea.  Placed back on NRB mask @ 15L.  RN notified.  Will continue to monitor.

## 2015-06-11 NOTE — Progress Notes (Signed)
Patient Demographics:    Tiffany Orozco, is a 65 y.o. female, DOB - [birth year], [id number]  Admit date - 06/08/2015   Admitting Physician Waldemar Dickens, MD  Outpatient Primary MD for the patient is [name], MD  LOS - 1   Chief Complaint  Patient presents with  . Emesis  . Fever        Subjective:    Tiffany Orozco today has, No headache, No chest pain, No abdominal pain - No Nausea, No new weakness tingling or numbness, No Cough but ++ SOB.     Assessment  & Plan :     1. Acute hypoxic respiratory failure due to influenza pneumonia. Question if she is developing ARDS. She is requiring nonrebreather oxygen with PaO2 of 48 this morning, she is on Tamiflu for influenza pneumonia, continue oral prednisone for now, she received trial of Lasix without much benefit. I doubt this is CHF. Continue supplemental oxygen will advance to CPAP, nebulizer treatments as needed, have consulted critical care as patient might need to be intubated if does not improve soon.  2. Ongoing diarrhea. Due to #1 above. Rule out C. difficile then initiate Imodium.  3. Hypokalemia. Replaced.  4. Hypothyroidism. On Synthroid continue.  5. Essential hypertension. For now on Norvasc continue.  6. Mild elevation of troponin in non-ACS pattern. Due to demand ischemia from #1. Echogram to evaluate EF and wall motion, place on aspirin, low-dose beta blocker if she can tolerate.    Code Status : Full, prognosis is guarded  Family Communication  : None present  Disposition Plan  : continue in SDU  Consults  :  PCCM  Procedures  :    TTE  DVT Prophylaxis  :  Lovenox   Lab Results  Component Value Date   PLT 270 06/11/2015    Inpatient Medications  Scheduled Meds: . amLODipine  10 mg Oral Daily  .  antiseptic oral rinse  7 mL Mouth Rinse BID  . dipyridamole-aspirin  1 capsule Oral BID  . enoxaparin (LOVENOX) injection  40 mg Subcutaneous Q24H  . feeding supplement  1 Container Oral BID BM  . furosemide  40 mg Intravenous BID  . guaiFENesin  1,200 mg Oral BID  . ipratropium-albuterol  3 mL Nebulization TID  . magnesium sulfate 1 - 4 g bolus IVPB  1 g Intravenous Once  . oseltamivir  75 mg Oral BID  . pantoprazole  40 mg Oral BID  . potassium chloride  10 mEq Intravenous Q1 Hr x 4  . potassium chloride  40 mEq Oral Q4H  . predniSONE  50 mg Oral Q breakfast  . sertraline  25 mg Oral Daily  . thyroid  90 mg Oral Daily   Continuous Infusions:  PRN Meds:.acetaminophen **OR** acetaminophen, nicotine polacrilex, promethazine, rifaximin  Antibiotics  :   Anti-infectives    Start     Dose/Rate Route Frequency Ordered Stop   06/09/15 1130  oseltamivir (TAMIFLU) capsule 75 mg     75 mg Oral 2 times daily 06/09/15 1117 06/14/15 0959   06/08/15 1215  levofloxacin (LEVAQUIN) IVPB 750 mg  Status:  Discontinued     750 mg 100 mL/hr over 90 Minutes Intravenous Every 24 hours 06/08/15 1223 06/09/15 1117  06/08/15 1212  rifaximin (XIFAXAN) tablet 550 mg     550 mg Oral 3 times daily PRN 06/08/15 1223     06/08/15 1015  cefTRIAXone (ROCEPHIN) 1 g in dextrose 5 % 50 mL IVPB     1 g 100 mL/hr over 30 Minutes Intravenous  Once 06/08/15 1012 06/08/15 1103   06/08/15 0930  azithromycin (ZITHROMAX) tablet 500 mg     500 mg Oral  Once 06/08/15 0919 06/08/15 0924        Objective:   Filed Vitals:   06/11/15 0000 06/11/15 0430 06/11/15 0725 06/11/15 0741  BP: 118/62     Pulse: 85  81   Temp: 98.2 F (36.8 C) 98.2 F (36.8 C) 97.1 F (36.2 C)   TempSrc: Oral Oral Axillary   Resp: 33  21   Height:      Weight:      SpO2: 98%  99% 100%    Wt Readings from Last 3 Encounters:  06/08/15 74.39 kg (164 lb)  05/30/15 77.111 kg (170 lb)  05/15/15 78.654 kg (173 lb 6.4 oz)      Intake/Output Summary (Last 24 hours) at 06/11/15 0855 Last data filed at 06/11/15 0400  Gross per 24 hour  Intake      0 ml  Output   1750 ml  Net  -1750 ml     Physical Exam  Awake Alert, Oriented X 3, No new F.N deficits, Normal affect, she clearly appears to be in respiratory distress [place].AT,PERRAL Supple Neck,No JVD, No cervical lymphadenopathy appriciated.  Symmetrical Chest wall movement, Good air movement bilaterally, Coarse crackles  RRR,No Gallops,Rubs or new Murmurs, No Parasternal Heave +ve B.Sounds, Abd Soft, No tenderness, No organomegaly appriciated, No rebound - guarding or rigidity. No Cyanosis, Clubbing or edema, No new Rash or bruise      Data Review:   Micro Results Recent Results (from the past 240 hour(s))  Urine culture     Status: None   Collection Time: 06/08/15 10:18 AM  Result Value Ref Range Status   Specimen Description URINE, RANDOM  Final   Special Requests NONE  Final   Culture 10,000 COLONIES/mL YEAST  Final   Report Status 06/09/2015 FINAL  Final  MRSA PCR Screening     Status: None   Collection Time: 06/09/15 12:17 PM  Result Value Ref Range Status   MRSA by PCR NEGATIVE NEGATIVE Final    Comment:        The GeneXpert MRSA Assay (FDA approved for NASAL specimens only), is one component of a comprehensive MRSA colonization surveillance program. It is not intended to diagnose MRSA infection nor to guide or monitor treatment for MRSA infections.   Culture, blood (Routine X 2) w Reflex to ID Panel     Status: None (Preliminary result)   Collection Time: 06/10/15 12:25 PM  Result Value Ref Range Status   Specimen Description BLOOD RIGHT HAND  Final   Special Requests IN PEDIATRIC BOTTLE [place]  Final   Culture PENDING  Incomplete   Report Status PENDING  Incomplete    Radiology Reports Dg Chest Port 1 View  06/11/2015  CLINICAL DATA:  Shortness of breath, mid abdominal pain EXAM: PORTABLE CHEST 1 VIEW COMPARISON:  06/10/2015  FINDINGS: There is diffuse bilateral interstitial thickening and alveolar airspace opacities. There is no pleural effusion or pneumothorax. There is stable cardiomegaly. There is no acute osseous abnormality. IMPRESSION: Bilateral diffuse interstitial and alveolar airspace opacities. Differential diagnosis includes pulmonary edema versus multilobar  pneumonia. Electronically Signed   By: Kathreen Devoid   On: 06/11/2015 08:24   Dg Chest Port 1 View  06/10/2015  CLINICAL DATA:  Acute respiratory failure with hypoxia EXAM: PORTABLE CHEST - 1 VIEW COMPARISON:  06/08/2015 FINDINGS: Cardiac shadow is stable. Diffuse bilateral infiltrates are now seen new from the prior exam. This likely represents congestive failure and pulmonary edema. No sizable effusion is noted. No bony abnormality is seen. IMPRESSION: Diffuse bilateral infiltrates likely related to a pulmonary edema and congestive failure. Electronically Signed   By: Inez Catalina M.D.   On: 06/10/2015 12:01   Dg Chest Portable 1 View  06/08/2015  CLINICAL DATA:  Patient with cough and shortness of breath. EXAM: PORTABLE CHEST 1 VIEW COMPARISON:  Chest radiograph 01/25/2015. FINDINGS: Monitoring leads overlie the patient. Stable cardiac and mediastinal contours. Minimal heterogeneous opacities within the lung bases bilaterally. No pleural effusion or pneumothorax. IMPRESSION: Bibasilar opacities favored to represent atelectasis. Electronically Signed   By: Lovey Newcomer M.D.   On: 06/08/2015 08:25   Dg Abd Portable 1v  06/11/2015  CLINICAL DATA:  Shortness of breath, mid abdominal pain EXAM: PORTABLE ABDOMEN - 1 VIEW COMPARISON:  None. FINDINGS: Prior scratch at there is evidence prior cholecystectomy. There is no bowel dilatation to suggest obstruction. There is no evidence of pneumoperitoneum, portal venous gas or pneumatosis. There are no pathologic calcifications along the expected course of the ureters. There is a dextrocurvature of the thoracolumbar spine.  IMPRESSION: Negative. Electronically Signed   By: Kathreen Devoid   On: 06/11/2015 08:25     CBC  Recent Labs Lab 06/08/15 0758 06/09/15 0548 06/10/15 1230 06/11/15 0256  WBC 6.2 13.6* 15.9* 16.9*  HGB 13.0 12.4 13.8 13.1  HCT 39.3 37.0 40.5 37.8  PLT 242 244 273 270  MCV 87.3 86.0 85.4 84.9  MCH 28.9 28.8 29.1 29.4  MCHC 33.1 33.5 34.1 34.7  RDW 14.8 14.4 14.7 14.6  LYMPHSABS 0.8  --   --   --   MONOABS 0.4  --   --   --   EOSABS 0.0  --   --   --   BASOSABS 0.0  --   --   --     Chemistries   Recent Labs Lab 06/08/15 0758 06/09/15 0548 06/10/15 0440 06/11/15 0256  NA 127* 134* 132* 131*  K 3.6 3.7 3.4* 2.8*  CL 96* 102 100* 94*  CO2 20* 23 26 24   GLUCOSE 124* 130* 99 91  BUN 11 9 9 9   CREATININE 0.69 0.60 0.75 0.64  CALCIUM 8.2* 8.4* 7.8* 8.2*  AST 24  --   --   --   ALT 22  --   --   --   ALKPHOS 90  --   --   --   BILITOT 0.4  --   --   --    ------------------------------------------------------------------------------------------------------------------ estimated creatinine clearance is 62.4 mL/min (by C-G formula based on Cr of 0.64). ------------------------------------------------------------------------------------------------------------------ No results for input(s): HGBA1C in the last 72 hours. ------------------------------------------------------------------------------------------------------------------ No results for input(s): CHOL, HDL, LDLCALC, TRIG, CHOLHDL, LDLDIRECT in the last 72 hours. ------------------------------------------------------------------------------------------------------------------ No results for input(s): TSH, T4TOTAL, T3FREE, THYROIDAB in the last 72 hours.  Invalid input(s): FREET3 ------------------------------------------------------------------------------------------------------------------ No results for input(s): VITAMINB12, FOLATE, FERRITIN, TIBC, IRON, RETICCTPCT in the last 72 hours.  Coagulation profile No  results for input(s): INR, PROTIME in the last 168 hours.  No results for input(s): DDIMER in the last 72 hours.  Cardiac  Enzymes  Recent Labs Lab 06/10/15 1230 06/11/15 0031 06/11/15 0256  TROPONINI 0.03 0.06* 0.09*   ------------------------------------------------------------------------------------------------------------------ Invalid input(s): POCBNP   Time Spent in minutes  35   SINGH,PRASHANT K M.D on 06/11/2015 at 8:55 AM  Between 7am to 7pm - Pager - [phone]  After 7pm go to [email or web address] - password [facility]  [facility] -  Office  [phone]

## 2015-06-11 NOTE — Progress Notes (Signed)
Physical Therapy Discharge Patient Details Name: Tiffany Orozco MRN: [id number] DOB: [birth year] Today's Date: 06/11/2015 Time:  -     Patient discharged from PT services secondary to medical decline - will need to re-order PT to resume therapy services.  Please see latest therapy progress note for current level of functioning and progress toward goals.    Progress and discharge plan discussed with patient and/or caregiver: Patient/Caregiver agrees with plan  GP     Irwin Brakeman F 06/11/2015, 12:22 PM [facility] Acute Rehabilitation [phone] (pager)

## 2015-06-11 NOTE — Progress Notes (Signed)
Tiffany Lutes MD. Pt complaining of increased SOB. VS on non-rebreather are O2 97%, RR 25-30, HR 70's. Crackles noted bilaterally. Earlier pt was clear/diminished. Warren Lacy MD notified. Will continue to monitor closely.

## 2015-06-11 NOTE — Progress Notes (Signed)
eLink Physician-Brief Progress Note Patient Name: ROSEALYN BOUCK DOB: 1950/07/10 MRN: [id number]   Date of Service  06/11/2015  HPI/Events of Note  Increased SOB. Admitted with H1N1 pneumonia. Lactic Acid = 1.4. BNP = 75. Nurse reports increasing crackles. O2 sat = 96% and RR = 28.   eICU Interventions  Will try empiric dose of Lasix 40 mg IV now.      Intervention Category Intermediate Interventions: Respiratory distress - evaluation and management  Sommer,Steven Eugene 06/11/2015, 5:53 PM

## 2015-06-12 ENCOUNTER — Inpatient Hospital Stay (HOSPITAL_COMMUNITY): Payer: Medicare Other

## 2015-06-12 DIAGNOSIS — J189 Pneumonia, unspecified organism: Secondary | ICD-10-CM

## 2015-06-12 DIAGNOSIS — J810 Acute pulmonary edema: Secondary | ICD-10-CM

## 2015-06-12 LAB — BASIC METABOLIC PANEL
Anion gap: 9 (ref 5–15)
BUN: 13 mg/dL (ref 6–20)
CO2: 26 mmol/L (ref 22–32)
Calcium: 8.5 mg/dL — ABNORMAL LOW (ref 8.9–10.3)
Chloride: 96 mmol/L — ABNORMAL LOW (ref 101–111)
Creatinine, Ser: 0.68 mg/dL (ref 0.44–1.00)
GFR calc Af Amer: >60 mL/min (ref >60)
GFR calc non Af Amer: >60 mL/min (ref >60)
Glucose, Bld: 131 mg/dL — ABNORMAL HIGH (ref 65–99)
Potassium: 3.5 mmol/L (ref 3.5–5.1)
Sodium: 131 mmol/L — ABNORMAL LOW (ref 135–145)

## 2015-06-12 LAB — CBC
HCT: 36.9 % (ref 36.0–46.0)
Hemoglobin: 12.5 g/dL (ref 12.0–15.0)
MCH: 28.6 pg (ref 26.0–34.0)
MCHC: 33.9 g/dL (ref 30.0–36.0)
MCV: 84.4 fL (ref 78.0–100.0)
Platelets: 277 10*3/uL (ref 150–400)
RBC: 4.37 MIL/uL (ref 3.87–5.11)
RDW: 14.8 % (ref 11.5–15.5)
WBC: 11.2 10*3/uL — ABNORMAL HIGH (ref 4.0–10.5)

## 2015-06-12 LAB — PROCALCITONIN: Procalcitonin: 0.39 ng/mL

## 2015-06-12 LAB — PHOSPHORUS: Phosphorus: 3 mg/dL (ref 2.5–4.6)

## 2015-06-12 LAB — LEGIONELLA ANTIGEN, URINE

## 2015-06-12 LAB — MAGNESIUM: Magnesium: 2.2 mg/dL (ref 1.7–2.4)

## 2015-06-12 MED ORDER — ASPIRIN-DIPYRIDAMOLE ER 25-200 MG PO CP12
1.0000 | ORAL_CAPSULE | Freq: Two times a day (BID) | ORAL | Status: DC
  Administered 2015-06-13 – 2015-06-16 (×7): 1 via ORAL
  Filled 2015-06-12 (×8): qty 1

## 2015-06-12 MED ORDER — DIPHENHYDRAMINE HCL 25 MG PO CAPS
25.0000 mg | ORAL_CAPSULE | Freq: Four times a day (QID) | ORAL | Status: DC | PRN
  Administered 2015-06-12: 25 mg via ORAL
  Filled 2015-06-12: qty 1

## 2015-06-12 MED ORDER — FUROSEMIDE 10 MG/ML IJ SOLN
40.0000 mg | Freq: Once | INTRAMUSCULAR | Status: AC
  Administered 2015-06-12: 40 mg via INTRAVENOUS
  Filled 2015-06-12: qty 4

## 2015-06-12 MED ORDER — POTASSIUM CHLORIDE CRYS ER 20 MEQ PO TBCR
40.0000 meq | EXTENDED_RELEASE_TABLET | Freq: Once | ORAL | Status: AC
  Administered 2015-06-12: 40 meq via ORAL
  Filled 2015-06-12: qty 2

## 2015-06-12 NOTE — Progress Notes (Signed)
PULMONARY / CRITICAL CARE MEDICINE   Name: Tiffany Orozco MRN: [id number] DOB: 02-Apr-1951    ADMISSION DATE:  06/08/2015  [facility]  CHIEF COMPLAINT:  Hypoxia  SUBJECTIVE:  Breathing normal effort overnight on NRB, diarrhea decreased this morning, fevers ended, good UOP with 1 dose lasix  VITAL SIGNS: BP 127/71 mmHg  Pulse 72  Temp(Src) 97.7 F (36.5 C) (Oral)  Resp 21  Ht 5' (1.524 m)  Wt 73.6 kg (162 lb 4.1 oz)  BMI 31.69 kg/m2  SpO2 96%  HEMODYNAMICS:    VENTILATOR SETTINGS:    INTAKE / OUTPUT: I/O last 3 completed shifts: In: 443.3 [I.V.:293.3; IV Piggyback:150] Out: 2450 [Urine:2450]  PHYSICAL EXAMINATION:  General:  Alert and oriented, NAD, on 70% nrb Neuro:  Awake and alert, moves all extremities to instruction, chronic dysphagia HEENT: No JVD, cervical lymphadenopathy Cardiovascular: RRR, no murmur/rub/gallop Lungs: Fair air movement, clear Abdomen: Soft, nontender, bowel sounds present Musculoskeletal: intact, grossly normal strength, no edema Skin: warm, dry  LABS:  BMET  Recent Labs Lab 06/10/15 0440 06/11/15 0256 06/11/15 1435 06/12/15 0335  NA 132* 131*  --  131*  K 3.4* 2.8* 3.9 3.5  CL 100* 94*  --  96*  CO2 26 24  --  26  BUN 9 9  --  13  CREATININE 0.75 0.64  --  0.68  GLUCOSE 99 91  --  131*    Electrolytes  Recent Labs Lab 06/10/15 0440 06/11/15 0256 06/12/15 0335  CALCIUM 7.8* 8.2* 8.5*  MG  --   --  2.2  PHOS  --   --  3.0    CBC  Recent Labs Lab 06/10/15 1230 06/11/15 0256 06/12/15 0335  WBC 15.9* 16.9* 11.2*  HGB 13.8 13.1 12.5  HCT 40.5 37.8 36.9  PLT 273 270 277    Coag's No results for input(s): APTT, INR in the last 168 hours.  Sepsis Markers  Recent Labs Lab 06/08/15 0751 06/08/15 1041 06/10/15 1230 06/12/15 0335  LATICACIDVEN 0.66 1.12 1.4  --   PROCALCITON  --   --  0.49 0.39    ABG  Recent Labs Lab 06/08/15 1338 06/10/15 1155 06/11/15 0751  PHART 7.404 7.495* 7.457*  PCO2ART  32.1* 29.1* 32.2*  PO2ART 72.0* 48.8* 117*    Liver Enzymes  Recent Labs Lab 06/08/15 0758  AST 24  ALT 22  ALKPHOS 90  BILITOT 0.4  ALBUMIN 3.4*    Cardiac Enzymes  Recent Labs Lab 06/10/15 1230 06/11/15 0031 06/11/15 0256  TROPONINI 0.03 0.06* 0.09*    Glucose  Recent Labs Lab 06/11/15 1349  GLUCAP 150*    Imaging Dg Chest Port 1 View  06/12/2015  CLINICAL DATA:  Respiratory failure, shortness of breath, history of COPD, CVA, community-acquired pneumonia, current smoker. EXAM: PORTABLE CHEST 1 VIEW COMPARISON:  Portable chest x-ray of April 10, 2016 FINDINGS: The lungs are well-expanded. The pulmonary interstitial markings remain increased but are slightly less conspicuous overall. No discrete alveolar infiltrate is observed. The cardiac silhouette is mildly enlarged. The pulmonary vascularity is not clearly engorged. There is no pleural effusion or pneumothorax. The trachea is midline. The bony thorax exhibits no acute abnormality. IMPRESSION: Slight interval improvement in the pulmonary interstitial infiltrates. Mild cardiomegaly without definite pulmonary vascular congestion. Electronically Signed   By: David  Martinique M.D.   On: 06/12/2015 07:17     STUDIES:  1/11 Echo >> EF 60 to 65%, mild LVH 1/12 CXR infiltrates much improved from 1/10  CULTURES: 1/9 Influenza  PCR >> Flu A/H1N1 1/10 Blood >> 1/11 C diff >> negative 1/11 Pneumococcal Ag >> 1/11 Legionella Ag >>  ANTIBIOTICS: 1/9 tamiflu >> 1/11 Levaquin >>   SIGNIFICANT EVENTS: 1/08 Admit 1/11 To ICU  LINES/TUBES:   DISCUSSION: 65 yo female smoker with FluA/H1N1 PNA and acute hypoxic respiratory failure.  She is not able to tolerate BiPAP, and transferred to ICU to monitor need for intubation.  ASSESSMENT / PLAN:  PULMONARY A: Acute hypoxic respiratory failure 2/2 to Flu A/H1N1 PNA. AECOPD. Tobacco abuse, no COPD on prev evals P:   Wean oxygen as tolerated to keep SpO2 > 92% Continue  systemic steroids Nebulizer Rx  CARDIOVASCULAR A:  Hx of HTN, HLD. P:  Hold BP meds for now  RENAL A:   Hypokalemia, improved P:   Replace electrolytes as needed  GASTROINTESTINAL A:   Hx of gerd. Esophogeal stenosis recent dilatation by Delfin Edis. P:   PPI Advance diet as tolerated  HEMATOLOGIC A:   No acute issue. P:  F/u CBC Lovenox for DVT prevention  INFECTIOUS A:   Sepsis from Flu A/H1N1 and possible bacterial superinfection. P:   Continue tamiflu Add levaquin >> allergy to PCN F/u Cx, negative so far  ENDOCRINE A:   Hx of Hypothroidism. P:   Continue armour thyroid  NEUROLOGIC A:   History of stroke, depression. P:   Monitor mental status Continue zoloft, ASA   FAMILY  - Updates: Updated pts husband at bedside 1/12 morning.  - Inter-disciplinary family meet or Palliative Care meeting due by:  1/14  Collier Salina, MD PGY-I Internal Medicine Resident Pager# [phone] 06/12/2015, 8:51 AM

## 2015-06-13 LAB — BASIC METABOLIC PANEL
Anion gap: 12 (ref 5–15)
BUN: 14 mg/dL (ref 6–20)
CO2: 25 mmol/L (ref 22–32)
Calcium: 8.5 mg/dL — ABNORMAL LOW (ref 8.9–10.3)
Chloride: 95 mmol/L — ABNORMAL LOW (ref 101–111)
Creatinine, Ser: 0.55 mg/dL (ref 0.44–1.00)
GFR calc Af Amer: >60 mL/min (ref >60)
GFR calc non Af Amer: >60 mL/min (ref >60)
Glucose, Bld: 176 mg/dL — ABNORMAL HIGH (ref 65–99)
Potassium: 3.7 mmol/L (ref 3.5–5.1)
Sodium: 132 mmol/L — ABNORMAL LOW (ref 135–145)

## 2015-06-13 LAB — CBC
HCT: 36.2 % (ref 36.0–46.0)
Hemoglobin: 12.4 g/dL (ref 12.0–15.0)
MCH: 28.9 pg (ref 26.0–34.0)
MCHC: 34.3 g/dL (ref 30.0–36.0)
MCV: 84.4 fL (ref 78.0–100.0)
Platelets: 306 10*3/uL (ref 150–400)
RBC: 4.29 MIL/uL (ref 3.87–5.11)
RDW: 14.7 % (ref 11.5–15.5)
WBC: 8.6 10*3/uL (ref 4.0–10.5)

## 2015-06-13 MED ORDER — FUROSEMIDE 80 MG PO TABS
80.0000 mg | ORAL_TABLET | Freq: Every day | ORAL | Status: DC
  Filled 2015-06-13: qty 1

## 2015-06-13 MED ORDER — PANTOPRAZOLE SODIUM 40 MG PO TBEC
40.0000 mg | DELAYED_RELEASE_TABLET | Freq: Every day | ORAL | Status: DC
  Administered 2015-06-13 – 2015-06-16 (×4): 40 mg via ORAL
  Filled 2015-06-13 (×3): qty 1

## 2015-06-13 MED ORDER — THYROID 60 MG PO TABS
90.0000 mg | ORAL_TABLET | Freq: Every day | ORAL | Status: DC
  Administered 2015-06-14 – 2015-06-16 (×3): 90 mg via ORAL
  Filled 2015-06-13 (×5): qty 1

## 2015-06-13 MED ORDER — OSELTAMIVIR PHOSPHATE 75 MG PO CAPS
75.0000 mg | ORAL_CAPSULE | Freq: Two times a day (BID) | ORAL | Status: AC
  Administered 2015-06-13 – 2015-06-14 (×4): 75 mg via ORAL
  Filled 2015-06-13 (×4): qty 1

## 2015-06-13 MED ORDER — FUROSEMIDE 80 MG PO TABS
80.0000 mg | ORAL_TABLET | Freq: Every day | ORAL | Status: AC
  Administered 2015-06-13 – 2015-06-14 (×2): 80 mg via ORAL
  Filled 2015-06-13: qty 1

## 2015-06-13 MED ORDER — PREDNISONE 50 MG PO TABS
50.0000 mg | ORAL_TABLET | Freq: Every day | ORAL | Status: DC
  Administered 2015-06-13 – 2015-06-14 (×2): 50 mg via ORAL
  Filled 2015-06-13 (×3): qty 1

## 2015-06-13 NOTE — Progress Notes (Signed)
NURSING PROGRESS NOTE  MICKELLE ROLOFF [id number] Transfer Data: 06/13/2015 2:23 PM Attending Provider: Chesley Mires, MD [place], MD Code Status: FULL  Tiffany Orozco is a 65 y.o. female patient transferred from 42M -No acute distress noted.  -No complaints of shortness of breath.  -No complaints of chest pain.    Blood pressure 115/62, pulse 63, temperature 97.2 F (36.2 C), temperature source Oral, resp. rate 20, height 5' (1.524 m), weight 72.802 kg (160 lb 8 oz), SpO2 98 %.   Allergies:  Lisinopril; Niacin; Welchol; Augmentin; Codeine; and Oxycodone hcl  Past Medical History:   has a past medical history of Anxiety; Depression; GERD (gastroesophageal reflux disease); Hypothyroidism; Skin cancer of face; Skin cancer of arm; Allergy; COPD (chronic obstructive pulmonary disease) ([place]); Hyperlipidemia; Hypertension; Obesity; DDD (degenerative disc disease), lumbar; Stroke ([place]) (07/2010); Osteopenia; Heart murmur; Thrombocytopenia ([place]); Female rectocele without uterine prolapse; IBS (irritable bowel syndrome); and Influenza A (06/2015).  Past Surgical History:   has past surgical history that includes Tubal ligation (1975); Appendectomy; Foot surgery (03/31/2009); EMGs (7/04); Cholecystectomy (2004); Foot surgery (2008, 2009, 2010); and Esophageal manometry (N/A, 03/17/2015).  Social History:   reports that she has been smoking Cigarettes.  She has a 31 pack-year smoking history. She has never used smokeless tobacco. She reports that she does not drink alcohol or use illicit drugs.  Skin: intact  Patient/Family orientated to room. Information packet given to patient/family. Admission inpatient armband information verified with patient/family to include name and date of birth and placed on patient arm. Side rails up x 2, fall assessment and education completed with patient/family. Patient/family able to verbalize understanding of risk associated with falls and verbalized understanding  to call for assistance before getting out of bed. Call light within reach. Patient/family able to voice and demonstrate understanding of unit orientation instructions.    Will continue to evaluate and treat per MD orders.

## 2015-06-13 NOTE — Evaluation (Signed)
Physical Therapy Evaluation Patient Details Name: Tiffany Orozco MRN: [id number] DOB: Jun 22, 1950 Today's Date: 06/13/2015   History of Present Illness  65 y.o. female with nausea, vomiting, and diarrhea admitted with Acute respiratory failure. Influenza A (H1N1) positive. Rapid response called due to resiratory distress and tx to ICU.   Clinical Impression  Patient s/p respiratory distress resulting in increased need for 02 and transfer to ICU due to downgrade in medical status now presents with decreased strength, endurance, respiratory status and mobility. Pain reported in left hip and back- chronic pain worsened from positioning in bed. Tolerated marching in place and transfer to chair with Min A for balance. Fatigues. Goals updated. Will follow acutely to maximize independence and mobility prior to return home.    Follow Up Recommendations Home health PT;Supervision for mobility/OOB    Equipment Recommendations  None recommended by PT    Recommendations for Other Services OT consult     Precautions / Restrictions Precautions Precautions: Fall Restrictions Weight Bearing Restrictions: No      Mobility  Bed Mobility Overal bed mobility: Needs Assistance Bed Mobility: Supine to Sit     Supine to sit: Supervision;HOB elevated     General bed mobility comments: Supervision for safety. Use of rails for support.   Transfers Overall transfer level: Needs assistance Equipment used: 1 person hand held assist Transfers: Sit to/from Stand Sit to Stand: Min guard         General transfer comment: Min guard for safety. Instability noted in BLEs in standing.  Ambulation/Gait Ambulation/Gait assistance: Min assist Ambulation Distance (Feet): 5 Feet Assistive device: 1 person hand held assist Gait Pattern/deviations: Step-through pattern Gait velocity: decreased Gait velocity interpretation: Below normal speed for age/gender General Gait Details: Able to take a few steps  to chair with Min A for balance. Unsteady on feet. DOE.  Stairs            Wheelchair Mobility    Modified Rankin (Stroke Patients Only)       Balance Overall balance assessment: Needs assistance Sitting-balance support: Feet supported;Single extremity supported Sitting balance-Leahy Scale: Good Sitting balance - Comments: Requires UE support at this time for balance/position of comfort.    Standing balance support: During functional activity Standing balance-Leahy Scale: Fair Standing balance comment: Able to stand unsupported but sway and instability noted. Min A for dynamic standing.                             Pertinent Vitals/Pain Pain Assessment: 0-10 Pain Score: 8  Pain Location: left hip, back Pain Descriptors / Indicators: Aching;Sore Pain Intervention(s): Monitored during session;Repositioned;Limited activity within patient's tolerance    Home Living Family/patient expects to be discharged to:: Private residence Living Arrangements: Spouse/significant other;Children Available Help at Discharge: Family;Available 24 hours/day Type of Home: House Home Access: Stairs to enter Entrance Stairs-Rails: None Entrance Stairs-Number of Steps: 2 Home Layout: One level Home Equipment: Walker - 2 wheels;Cane - single point;Shower seat - built in      Prior Function Level of Independence: Independent               Hand Dominance   Dominant Hand: Right    Extremity/Trunk Assessment   Upper Extremity Assessment: Defer to OT evaluation           Lower Extremity Assessment: Generalized weakness      Cervical / Trunk Assessment: Normal  Communication   Communication: No difficulties  Cognition Arousal/Alertness:  Awake/alert Behavior During Therapy: WFL for tasks assessed/performed Overall Cognitive Status: Within Functional Limits for tasks assessed                      General Comments General comments (skin integrity, edema,  etc.): Sp02 remained >92% on 4L/min 02. Other VSS.    Exercises Other Exercises Other Exercises: marching in place x20 sec      Assessment/Plan    PT Assessment Patient needs continued PT services  PT Diagnosis Difficulty walking;Generalized weakness;Acute pain   PT Problem List Decreased strength;Decreased activity tolerance;Decreased balance;Decreased mobility;Cardiopulmonary status limiting activity;Pain  PT Treatment Interventions Balance training;Gait training;Functional mobility training;Therapeutic activities;Therapeutic exercise;Patient/family education;Stair training   PT Goals (Current goals can be found in the Care Plan section) Acute Rehab PT Goals Patient Stated Goal: return to independence PT Goal Formulation: With patient Time For Goal Achievement: 06/27/15 Potential to Achieve Goals: Good    Frequency Min 3X/week   Barriers to discharge Inaccessible home environment steps to climb to enter home    Co-evaluation               End of Session Equipment Utilized During Treatment: Oxygen;Gait belt Activity Tolerance: Patient limited by fatigue;Patient tolerated treatment well Patient left: in chair;with call bell/phone within reach Nurse Communication: Mobility status         Time: [id number] PT Time Calculation (min) (ACUTE ONLY): 23 min   Charges:   PT Evaluation $PT Eval Moderate Complexity: 1 Procedure PT Treatments $Therapeutic Activity: 8-22 mins   PT G Codes:        Lilee Aldea A Shanell Aden 06/13/2015, 11:32 AM Wray Kearns, PT, DPT [phone]

## 2015-06-13 NOTE — Progress Notes (Signed)
Report received from [place], [place] for transfer to [facility]

## 2015-06-13 NOTE — Progress Notes (Signed)
PULMONARY / CRITICAL CARE MEDICINE   Name: Tiffany Orozco MRN: [id number] DOB: Dec 14, 1950    ADMISSION DATE:  06/08/2015  [facility]  CHIEF COMPLAINT:  Hypoxia  SUBJECTIVE: Breathing improved now comfortable on nasal cannula, productive cough o/n with secretions becoming more clear, afebrile, good UOP to lasix  VITAL SIGNS: BP 132/98 mmHg  Pulse 62  Temp(Src) 97.7 F (36.5 C) (Oral)  Resp 21  Ht 5' (1.524 m)  Wt 72.802 kg (160 lb 8 oz)  BMI 31.35 kg/m2  SpO2 99%  HEMODYNAMICS:    VENTILATOR SETTINGS:    INTAKE / OUTPUT: I/O last 3 completed shifts: In: 250 [I.V.:250] Out: 3175 [Urine:3175]  PHYSICAL EXAMINATION:  General:  Alert and oriented, NAD, on [place] Neuro:  Awake, alert, oriented, follows all commands easily HEENT: No JVD, no cervical lymphadenopathy, oral mucosa appears moist Cardiovascular: RRR, no murmur/rub/gallop Lungs: Good air movement, faint crackles in bases Abdomen: Soft, nontender, bowel sounds present Musculoskeletal: intact, grossly normal strength, no edema Skin: warm, dry  LABS:  BMET  Recent Labs Lab 06/11/15 0256 06/11/15 1435 06/12/15 0335 06/13/15 0341  NA 131*  --  131* 132*  K 2.8* 3.9 3.5 3.7  CL 94*  --  96* 95*  CO2 24  --  26 25  BUN 9  --  13 14  CREATININE 0.64  --  0.68 0.55  GLUCOSE 91  --  131* 176*    Electrolytes  Recent Labs Lab 06/11/15 0256 06/12/15 0335 06/13/15 0341  CALCIUM 8.2* 8.5* 8.5*  MG  --  2.2  --   PHOS  --  3.0  --     CBC  Recent Labs Lab 06/11/15 0256 06/12/15 0335 06/13/15 0341  WBC 16.9* 11.2* 8.6  HGB 13.1 12.5 12.4  HCT 37.8 36.9 36.2  PLT 270 277 306    Coag's No results for input(s): APTT, INR in the last 168 hours.  Sepsis Markers  Recent Labs Lab 06/08/15 0751 06/08/15 1041 06/10/15 1230 06/12/15 0335  LATICACIDVEN 0.66 1.12 1.4  --   PROCALCITON  --   --  0.49 0.39    ABG  Recent Labs Lab 06/08/15 1338 06/10/15 1155 06/11/15 0751  PHART 7.404 7.495*  7.457*  PCO2ART 32.1* 29.1* 32.2*  PO2ART 72.0* 48.8* 117*    Liver Enzymes  Recent Labs Lab 06/08/15 0758  AST 24  ALT 22  ALKPHOS 90  BILITOT 0.4  ALBUMIN 3.4*    Cardiac Enzymes  Recent Labs Lab 06/10/15 1230 06/11/15 0031 06/11/15 0256  TROPONINI 0.03 0.06* 0.09*    Glucose  Recent Labs Lab 06/11/15 1349  GLUCAP 150*    Imaging No results found.   STUDIES:  1/11 Echo >> EF 60 to 65%, mild LVH 1/12 CXR infiltrates much improved from 1/10  CULTURES: 1/9 Influenza PCR >> Flu A/H1N1 1/10 Blood >> 1/11 C diff >> neg 1/11 Pneumococcal Ag >>neg 1/11 Legionella Ag >>  ANTIBIOTICS: 1/9 tamiflu >> 1/11 Levaquin >> 1/12  SIGNIFICANT EVENTS: 1/08 Admit 1/11 To ICU  LINES/TUBES:   DISCUSSION: 65 yo female smoker with FluA/H1N1 PNA and acute hypoxic respiratory failure.  She is not able to tolerate BiPAP, and transferred to ICU to monitor need for intubation.  ASSESSMENT / PLAN:  PULMONARY A: Acute hypoxic respiratory failure 2/2 to Flu A/H1N1 PNA. AECOPD(?) Tobacco abuse, no COPD on prev evals P:   Wean oxygen as tolerated to keep SpO2 > 92% Decrease systemic steroids Nebulizer Rx Lasix once daily repeat  CARDIOVASCULAR  A:  Hx of HTN, HLD. P:  Hold BP meds for now  RENAL A:   Hypokalemia, improved P:   Replace electrolytes as needed F/U Bmet AM D/c foley  GASTROINTESTINAL A:   Hx of gerd. Esophogeal stenosis recent dilatation by Delfin Edis. P:   PPI (PTA protonix) Advance diet as tolerated  HEMATOLOGIC A:   No acute issue. P:  Decrease CBC Lovenox for DVT prevention  INFECTIOUS A:   Sepsis from Flu A/H1N1 and possible bacterial superinfection. P:   Continue tamiflu Add levaquin >> allergy to PCN F/u Cx, negative so far  ENDOCRINE A:   Hx of Hypothroidism. P:   Continue armour thyroid  NEUROLOGIC A:   History of stroke, depression. P:   Monitor mental status Continue zoloft, ASA Start  PT/OT   FAMILY  - Updates: Updated pts husband at bedside 1/12 morning. - Inter-disciplinary family meet or Palliative Care meeting due by:  1/14  Collier Salina, MD PGY-I Internal Medicine Resident Pager# [phone] 06/13/2015, 8:09 AM

## 2015-06-14 DIAGNOSIS — R197 Diarrhea, unspecified: Secondary | ICD-10-CM

## 2015-06-14 DIAGNOSIS — E871 Hypo-osmolality and hyponatremia: Secondary | ICD-10-CM

## 2015-06-14 DIAGNOSIS — K219 Gastro-esophageal reflux disease without esophagitis: Secondary | ICD-10-CM

## 2015-06-14 DIAGNOSIS — F17200 Nicotine dependence, unspecified, uncomplicated: Secondary | ICD-10-CM

## 2015-06-14 DIAGNOSIS — R112 Nausea with vomiting, unspecified: Secondary | ICD-10-CM

## 2015-06-14 LAB — PROCALCITONIN: Procalcitonin: 0.11 ng/mL

## 2015-06-14 MED ORDER — MAGIC MOUTHWASH W/LIDOCAINE
10.0000 mL | Freq: Four times a day (QID) | ORAL | Status: DC
  Administered 2015-06-14 – 2015-06-15 (×7): 10 mL via ORAL
  Filled 2015-06-14 (×13): qty 10

## 2015-06-14 MED ORDER — PREDNISONE 20 MG PO TABS
30.0000 mg | ORAL_TABLET | Freq: Every day | ORAL | Status: DC
  Administered 2015-06-15 – 2015-06-16 (×2): 30 mg via ORAL
  Filled 2015-06-14 (×2): qty 1

## 2015-06-14 MED ORDER — ALBUTEROL SULFATE (2.5 MG/3ML) 0.083% IN NEBU
2.5000 mg | INHALATION_SOLUTION | RESPIRATORY_TRACT | Status: DC | PRN
Start: 2015-06-14 — End: 2015-06-16
  Administered 2015-06-14 – 2015-06-16 (×2): 2.5 mg via RESPIRATORY_TRACT
  Filled 2015-06-14 (×2): qty 3

## 2015-06-14 NOTE — Progress Notes (Deleted)
Physical Therapy Treatment Patient Details Name: Tiffany Orozco MRN: [id number] DOB: [birth year] Today's Date: 06/14/2015    History of Present Illness 65 y.o. female with nausea, vomiting, and diarrhea admitted with Acute respiratory failure. Influenza A (H1N1) positive. Rapid response called due to resiratory distress and tx to ICU.     PT Comments    Pt is making steady progress with mobility toward goals  Follow Up Recommendations  Home health PT;Supervision for mobility/OOB     Equipment Recommendations  None recommended by PT    Recommendations for Other Services OT consult     Precautions / Restrictions Precautions Precautions: Fall;Other (comment) Precaution Comments: droplet precautions Restrictions Weight Bearing Restrictions: No    Mobility  Bed Mobility         Supine to sit: Modified independent (Device/Increase time)     General bed mobility comments: HOB elevated, no rails used  Transfers Overall transfer level: Modified independent   Transfers: Sit to/from Stand Sit to Stand: Modified independent (Device/Increase time)         General transfer comment: demo's safe technique, no assist or cues needed, only increased time  Ambulation/Gait Ambulation/Gait assistance: Supervision Ambulation Distance (Feet): 40 Feet (in/around room, pt deferred hallway today) Assistive device: None Gait Pattern/deviations: WFL(Within Functional Limits) Gait velocity: decreased Gait velocity interpretation: Below normal speed for age/gender General Gait Details: no balance issued noted. demo'd good foot clearance bil'ly, no unsteadiness or balance loss with 180* turns in room. mimimal shortness of breath reported.       Cognition Arousal/Alertness: Awake/alert Behavior During Therapy: WFL for tasks assessed/performed Overall Cognitive Status: Within Functional Limits for tasks assessed         Exercises General Exercises - Lower Extremity Toe Raises:  AROM;Strengthening;Both;10 reps;Standing Toe Raises Limitations: min HHA for balance Heel Raises: AROM;Strengthening;Both;10 reps;Standing;Limitations Heel Raises Limitations: min HHA for balance Mini-Sqauts: AROM;Strengthening;Both;5 reps;Standing;Limitations Mini Squats Limitations: min HHA for balance Other Exercises Other Exercises: standing trunk extension x 10 reps, no assist needed for balance. pt reported decreased hip/back pain afterwards.     Pertinent Vitals/Pain Pain Assessment: 0-10 Pain Score: 8  Pain Location: back and left hip Pain Descriptors / Indicators: Aching;Sore;Tightness Pain Intervention(s): Limited activity within patient's tolerance;Monitored during session;Premedicated before session;Repositioned     PT Goals (current goals can now be found in the care plan section) Acute Rehab PT Goals Patient Stated Goal: return to independence PT Goal Formulation: With patient Time For Goal Achievement: 06/27/15 Potential to Achieve Goals: Good Progress towards PT goals: Progressing toward goals    Frequency  Min 3X/week    PT Plan Current plan remains appropriate    End of Session Equipment Utilized During Treatment: Gait belt;Oxygen (reapplied at end of sesion, was off on arrival, per pt she forgot she took it off when asked) Activity Tolerance: Patient tolerated treatment well;No increased pain Patient left: in chair;with call bell/phone within reach     Time: 1224-1238 PT Time Calculation (min) (ACUTE ONLY): 14 min  Charges:  $Therapeutic Exercise: 8-22 mins           Willow Ora 06/14/2015, 1:54 PM  Willow Ora, PTA, CLT Acute Rehab Services [phone] 06/14/2015, 1:55 PM

## 2015-06-14 NOTE — Progress Notes (Signed)
eLink Physician-Brief Progress Note Patient Name: Tiffany Orozco DOB: 13-Jan-1951 MRN: [id number]   Date of Service  06/14/2015  HPI/Events of Note  Patient c/o of throat esophageal pain.  Nurse noted some small white patches in back of throat.  eICU Interventions  Concern for thrush.  Will start MMW To be evaulated in AM     Intervention Category Intermediate Interventions: Pain - evaluation and management  DETERDING,ELIZABETH 06/14/2015, 1:02 AM

## 2015-06-14 NOTE — Progress Notes (Signed)
PROGRESS NOTE    Tiffany Orozco [id number] DOB: 01-05-1951 DOA: 06/08/2015 PCP: [name], MD  HPI/Brief narrative 65 year old female patient with history of COPD, HTN, HLD, IBS, anxiety/depression, hypothyroid, tobacco abuse, presented to [facility] ED on 06/08/15 with 2-3 day history of nausea, vomiting, diarrhea, cough, DOE, subjective fevers, chills and intermittent headaches. In the ED, febrile 102.24F, hypoxic in the 80s. Admitted initially to medical floor for acute hypoxic respiratory failure secondary to influenza, COPD and possible pneumonia. She developed progressive hypoxia and was transferred to ICU under CCM care. She improved and was transferred back to the floor and [place] service on 06/14/15.   Assessment/Plan:   Acute hypoxic respiratory failure secondary to flu A/H1 N1 pneumonia, possible COPD exacerbation - Treated with Tamiflu, steroids and levofloxacin-discontinued. - Clinically improved. Complete 5 days Tamiflu. Taper steroids rapidly. - Wean off of oxygen to keep saturations >92%.  Possible COPD exacerbation - History of tobacco abuse but no COPD on previous evaluations. - Oxygen, bronchodilator nebulizations and steroid taper. Improving  Tobacco abuse - Cessation counseled  Essential hypertension - Controlled. Medications held for now.  Hyperlipidemia  Hypokalemia  - Replaced.  GERD/esophageal stenosis status post recent dilatation by Dr. [name] - Continue PPI. Diet as tolerated.  Oral thrush - Minimal on exam. Continue Magic mouthwash.  Nausea, vomiting and diarrhea - ? Part of flu like illness. Nausea and vomiting have resolved. Some diarrhea persisting. Monitor - C. difficile testing negative.  Sepsis secondary to flu A/H1 N1 - Sepsis physiology resolved. Blood cultures 2: Negative to date. C. difficile testing negative.  Hypothyroid - Continue Armour Thyroid  History of stroke - Aggrenox to continue  Depression - Continue  Zoloft  Hyponatremia - Stable. Initially related to dehydration and HCTZ. Now? SIADH.  Minimally elevated troponin - Likely secondary to acute illness.    DVT prophylaxis: Lovenox Code Status: Full Family Communication: None at bedside Disposition Plan: DC home when medically stable   Consultants:  CCM-signed off  Procedures:  Catheter-discontinued  Antimicrobials:  Azithromycin 1 dose  Rocephin 1 dose  Levofloxacin 1/8 > 1/11  Tamiflu 1/9 > 1/14  Subjective: Denies dyspnea. Some cough. Complains of sore mouth and some difficulty swallowing related to thrush. Diarrhea +.  Objective: Filed Vitals:   06/13/15 1300 06/13/15 2104 06/14/15 0509 06/14/15 1313  BP:  141/72 142/72 111/62  Pulse: 63 64 58 67  Temp:  97.5 F (36.4 C) 98 F (36.7 C) 97.9 F (36.6 C)  TempSrc:  Oral Oral Oral  Resp: 20 20 20 18   Height:      Weight:      SpO2: 98% 95% 92% 95%    Intake/Output Summary (Last 24 hours) at 06/14/15 1424 Last data filed at 06/14/15 1315  Gross per 24 hour  Intake    658 ml  Output   1350 ml  Net   -692 ml   Filed Weights   06/08/15 0721 06/11/15 1352 06/13/15 0500  Weight: 74.39 kg (164 lb) 73.6 kg (162 lb 4.1 oz) 72.802 kg (160 lb 8 oz)    Exam:  General exam: Pleasant middle-aged female sitting up comfortably in bed. Oral cavity: Speck of thrush left posterior buccal mucosa. Respiratory system: Slightly harsh breath sounds but no crackles, wheezing or rhonchi. No increased work of breathing. Cardiovascular system: S1 & S2 heard, RRR. No JVD, murmurs, gallops, clicks or pedal edema. Gastrointestinal system: Abdomen is nondistended, soft and nontender. Normal bowel sounds heard. Central nervous system: Alert and oriented. No  focal neurological deficits. Extremities: Symmetric 5 x 5 power.   Data Reviewed: Basic Metabolic Panel:  Recent Labs Lab 06/09/15 0548 06/10/15 0440 06/11/15 0256 06/11/15 1435 06/12/15 0335 06/13/15 0341   NA 134* 132* 131*  --  131* 132*  K 3.7 3.4* 2.8* 3.9 3.5 3.7  CL 102 100* 94*  --  96* 95*  CO2 23 26 24   --  26 25  GLUCOSE 130* 99 91  --  131* 176*  BUN 9 9 9   --  13 14  CREATININE 0.60 0.75 0.64  --  0.68 0.55  CALCIUM 8.4* 7.8* 8.2*  --  8.5* 8.5*  MG  --   --   --   --  2.2  --   PHOS  --   --   --   --  3.0  --    Liver Function Tests:  Recent Labs Lab 06/08/15 0758  AST 24  ALT 22  ALKPHOS 90  BILITOT 0.4  PROT 6.3*  ALBUMIN 3.4*   No results for input(s): LIPASE, AMYLASE in the last 168 hours. No results for input(s): AMMONIA in the last 168 hours. CBC:  Recent Labs Lab 06/08/15 0758 06/09/15 0548 06/10/15 1230 06/11/15 0256 06/12/15 0335 06/13/15 0341  WBC 6.2 13.6* 15.9* 16.9* 11.2* 8.6  NEUTROABS 4.9  --   --   --   --   --   HGB 13.0 12.4 13.8 13.1 12.5 12.4  HCT 39.3 37.0 40.5 37.8 36.9 36.2  MCV 87.3 86.0 85.4 84.9 84.4 84.4  PLT 242 244 273 270 277 306   Cardiac Enzymes:  Recent Labs Lab 06/10/15 1230 06/11/15 0031 06/11/15 0256  TROPONINI 0.03 0.06* 0.09*   BNP (last 3 results) No results for input(s): PROBNP in the last 8760 hours. CBG:  Recent Labs Lab 06/11/15 1349  GLUCAP 150*    Recent Results (from the past 240 hour(s))  Urine culture     Status: None   Collection Time: 06/08/15 10:18 AM  Result Value Ref Range Status   Specimen Description URINE, RANDOM  Final   Special Requests NONE  Final   Culture 10,000 COLONIES/mL YEAST  Final   Report Status 06/09/2015 FINAL  Final  MRSA PCR Screening     Status: None   Collection Time: 06/09/15 12:17 PM  Result Value Ref Range Status   MRSA by PCR NEGATIVE NEGATIVE Final    Comment:        The GeneXpert MRSA Assay (FDA approved for NASAL specimens only), is one component of a comprehensive MRSA colonization surveillance program. It is not intended to diagnose MRSA infection nor to guide or monitor treatment for MRSA infections.   Culture, blood (Routine X 2) w  Reflex to ID Panel     Status: None (Preliminary result)   Collection Time: 06/10/15 12:25 PM  Result Value Ref Range Status   Specimen Description BLOOD RIGHT HAND  Final   Special Requests IN PEDIATRIC BOTTLE 1CC  Final   Culture NO GROWTH 3 DAYS  Final   Report Status PENDING  Incomplete  Culture, blood (Routine X 2) w Reflex to ID Panel     Status: None (Preliminary result)   Collection Time: 06/10/15 12:33 PM  Result Value Ref Range Status   Specimen Description BLOOD LEFT HAND  Final   Special Requests BOTTLES DRAWN AEROBIC ONLY [place]  Final   Culture NO GROWTH 3 DAYS  Final   Report Status PENDING  Incomplete  C  difficile quick scan w PCR reflex     Status: None   Collection Time: 06/11/15  9:04 AM  Result Value Ref Range Status   C Diff antigen NEGATIVE NEGATIVE Final   C Diff toxin NEGATIVE NEGATIVE Final   C Diff interpretation Negative for toxigenic C. difficile  Final         Studies: No results found.      Scheduled Meds: . antiseptic oral rinse  7 mL Mouth Rinse BID  . dipyridamole-aspirin  1 capsule Oral BID  . enoxaparin (LOVENOX) injection  40 mg Subcutaneous Q24H  . feeding supplement  1 Container Oral BID BM  . guaiFENesin  1,200 mg Oral BID  . ipratropium-albuterol  3 mL Nebulization TID  . magic mouthwash w/lidocaine  10 mL Oral QID  . oseltamivir  75 mg Oral BID  . pantoprazole  40 mg Oral Daily  . predniSONE  50 mg Oral Q breakfast  . sertraline  25 mg Oral Daily  . thyroid  90 mg Oral QAC breakfast   Continuous Infusions:   Principal Problem:   Acute respiratory failure (HCC) Active Problems:   HYPERCHOLESTEROLEMIA   GERD   Mild dehydration   Hyponatremia   COPD exacerbation (HCC)   History of CVA (cerebrovascular accident)   IBS (irritable bowel syndrome)   H1N1 influenza   Tobacco use disorder   Acute pulmonary edema (HCC)   Acute respiratory failure with hypoxia (HCC)   Nausea vomiting and diarrhea   CAP (community acquired  pneumonia)    Time spent: 30 minutes.    Vernell Leep, MD, FACP, FHM. [facility] Pager [phone]  If 7PM-7AM, please contact night-coverage [email or web address] Password [email or web address] 06/14/2015, 2:24 PM    LOS: 4 days

## 2015-06-15 DIAGNOSIS — E876 Hypokalemia: Secondary | ICD-10-CM

## 2015-06-15 LAB — BASIC METABOLIC PANEL
Anion gap: 10 (ref 5–15)
BUN: 13 mg/dL (ref 6–20)
CO2: 29 mmol/L (ref 22–32)
Calcium: 8.3 mg/dL — ABNORMAL LOW (ref 8.9–10.3)
Chloride: 91 mmol/L — ABNORMAL LOW (ref 101–111)
Creatinine, Ser: 0.65 mg/dL (ref 0.44–1.00)
GFR calc Af Amer: >60 mL/min (ref >60)
GFR calc non Af Amer: >60 mL/min (ref >60)
Glucose, Bld: 111 mg/dL — ABNORMAL HIGH (ref 65–99)
Potassium: 2.8 mmol/L — ABNORMAL LOW (ref 3.5–5.1)
Sodium: 130 mmol/L — ABNORMAL LOW (ref 135–145)

## 2015-06-15 LAB — CULTURE, BLOOD (ROUTINE X 2)
Culture: NO GROWTH
Culture: NO GROWTH

## 2015-06-15 MED ORDER — ONDANSETRON HCL 4 MG PO TABS
4.0000 mg | ORAL_TABLET | Freq: Four times a day (QID) | ORAL | Status: DC | PRN
  Administered 2015-06-15 – 2015-06-16 (×3): 4 mg via ORAL
  Filled 2015-06-15 (×3): qty 1

## 2015-06-15 MED ORDER — POTASSIUM CHLORIDE CRYS ER 20 MEQ PO TBCR
40.0000 meq | EXTENDED_RELEASE_TABLET | ORAL | Status: AC
  Administered 2015-06-15 (×2): 40 meq via ORAL
  Filled 2015-06-15 (×2): qty 2

## 2015-06-15 NOTE — Progress Notes (Signed)
PROGRESS NOTE    Tiffany Orozco [id number] DOB: 07/15/1950 DOA: 06/08/2015 PCP: [name], MD  HPI/Brief narrative 65 year old female patient with history of COPD, HTN, HLD, IBS, anxiety/depression, hypothyroid, tobacco abuse, presented to [facility] ED on 06/08/15 with 2-3 day history of nausea, vomiting, diarrhea, cough, DOE, subjective fevers, chills and intermittent headaches. In the ED, febrile 102.54F, hypoxic in the 80s. Admitted initially to medical floor for acute hypoxic respiratory failure secondary to influenza, COPD and possible pneumonia. She developed progressive hypoxia and was transferred to ICU under CCM care. She improved and was transferred back to the floor and [place] service on 06/14/15.   Assessment/Plan:   Acute hypoxic respiratory failure secondary to flu A/H1 N1 pneumonia, possible COPD exacerbation - Treated with Tamiflu, steroids and levofloxacin-discontinued. - Clinically improved. Completed 5 days Tamiflu. Taper steroids rapidly. - Wean off of oxygen to keep saturations >92% > d/w RN.  Possible COPD exacerbation - History of tobacco abuse but no COPD on previous evaluations. - Oxygen, bronchodilator nebulizations and steroid taper. Improved  Tobacco abuse - Cessation counseled  Essential hypertension - Controlled. Medications held for now.  Hyperlipidemia  Hypokalemia  - Replaced.  GERD/esophageal stenosis status post recent dilatation by Dr. [name] - Continue PPI. Diet as tolerated.  Oral thrush - Minimal on exam. Continue Magic mouthwash. Improving.  Nausea, vomiting and diarrhea - ? Part of flu like illness. Nausea and vomiting have resolved. Some diarrhea persisting. Monitor - C. difficile testing negative. - Resolved  Sepsis secondary to flu A/H1 N1 - Sepsis physiology resolved. Blood cultures 2: Negative to date. C. difficile testing negative.  Hypothyroid - Continue Armour Thyroid  History of stroke - Aggrenox to  continue  Depression - Continue Zoloft  Hyponatremia - Stable. Initially related to dehydration and HCTZ. Now? SIADH.  Minimally elevated troponin - Likely secondary to acute illness.  Hypokalemia - replace and follow.    DVT prophylaxis: Lovenox Code Status: Full Family Communication: None at bedside Disposition Plan: DC home when medically stable, possibly 1/16   Consultants:  CCM-signed off  Procedures:  Catheter-discontinued  Antimicrobials:  Azithromycin 1 dose  Rocephin 1 dose  Levofloxacin 1/8 > 1/11  Tamiflu 1/9 > 1/14  Subjective: Feels better. Less sore mouth and throat. No SOB or diarrhea.  Objective: Filed Vitals:   06/14/15 2100 06/14/15 2249 06/15/15 0550 06/15/15 1022  BP:  101/71 125/60   Pulse:  67 61   Temp:  98.1 F (36.7 C) 98.2 F (36.8 C)   TempSrc:  Oral    Resp:  18 19   Height:      Weight: 74.4 kg (164 lb 0.4 oz)     SpO2:  97% 96% 92%    Intake/Output Summary (Last 24 hours) at 06/15/15 1330 Last data filed at 06/15/15 1012  Gross per 24 hour  Intake    460 ml  Output   2450 ml  Net  -1990 ml   Filed Weights   06/11/15 1352 06/13/15 0500 06/14/15 2100  Weight: 73.6 kg (162 lb 4.1 oz) 72.802 kg (160 lb 8 oz) 74.4 kg (164 lb 0.4 oz)    Exam:  General exam: Pleasant middle-aged female sitting up comfortably in bed. Oral cavity: Speck of thrush left posterior buccal mucosa. Respiratory system: Slightly harsh breath sounds but no crackles, wheezing or rhonchi - improving. No increased work of breathing. Cardiovascular system: S1 & S2 heard, RRR. No JVD, murmurs, gallops, clicks or pedal edema. Gastrointestinal system: Abdomen is nondistended, soft  and nontender. Normal bowel sounds heard. Central nervous system: Alert and oriented. No focal neurological deficits. Extremities: Symmetric 5 x 5 power.   Data Reviewed: Basic Metabolic Panel:  Recent Labs Lab 06/10/15 0440 06/11/15 0256 06/11/15 1435  06/12/15 0335 06/13/15 0341 06/15/15 0600  NA 132* 131*  --  131* 132* 130*  K 3.4* 2.8* 3.9 3.5 3.7 2.8*  CL 100* 94*  --  96* 95* 91*  CO2 26 24  --  26 25 29   GLUCOSE 99 91  --  131* 176* 111*  BUN 9 9  --  13 14 13   CREATININE 0.75 0.64  --  0.68 0.55 0.65  CALCIUM 7.8* 8.2*  --  8.5* 8.5* 8.3*  MG  --   --   --  2.2  --   --   PHOS  --   --   --  3.0  --   --    Liver Function Tests: No results for input(s): AST, ALT, ALKPHOS, BILITOT, PROT, ALBUMIN in the last 168 hours. No results for input(s): LIPASE, AMYLASE in the last 168 hours. No results for input(s): AMMONIA in the last 168 hours. CBC:  Recent Labs Lab 06/09/15 0548 06/10/15 1230 06/11/15 0256 06/12/15 0335 06/13/15 0341  WBC 13.6* 15.9* 16.9* 11.2* 8.6  HGB 12.4 13.8 13.1 12.5 12.4  HCT 37.0 40.5 37.8 36.9 36.2  MCV 86.0 85.4 84.9 84.4 84.4  PLT 244 273 270 277 306   Cardiac Enzymes:  Recent Labs Lab 06/10/15 1230 06/11/15 0031 06/11/15 0256  TROPONINI 0.03 0.06* 0.09*   BNP (last 3 results) No results for input(s): PROBNP in the last 8760 hours. CBG:  Recent Labs Lab 06/11/15 1349  GLUCAP 150*    Recent Results (from the past 240 hour(s))  Urine culture     Status: None   Collection Time: 06/08/15 10:18 AM  Result Value Ref Range Status   Specimen Description URINE, RANDOM  Final   Special Requests NONE  Final   Culture 10,000 COLONIES/mL YEAST  Final   Report Status 06/09/2015 FINAL  Final  MRSA PCR Screening     Status: None   Collection Time: 06/09/15 12:17 PM  Result Value Ref Range Status   MRSA by PCR NEGATIVE NEGATIVE Final    Comment:        The GeneXpert MRSA Assay (FDA approved for NASAL specimens only), is one component of a comprehensive MRSA colonization surveillance program. It is not intended to diagnose MRSA infection nor to guide or monitor treatment for MRSA infections.   Culture, blood (Routine X 2) w Reflex to ID Panel     Status: None (Preliminary result)    Collection Time: 06/10/15 12:25 PM  Result Value Ref Range Status   Specimen Description BLOOD RIGHT HAND  Final   Special Requests IN PEDIATRIC BOTTLE 1CC  Final   Culture NO GROWTH 4 DAYS  Final   Report Status PENDING  Incomplete  Culture, blood (Routine X 2) w Reflex to ID Panel     Status: None (Preliminary result)   Collection Time: 06/10/15 12:33 PM  Result Value Ref Range Status   Specimen Description BLOOD LEFT HAND  Final   Special Requests BOTTLES DRAWN AEROBIC ONLY 6CC  Final   Culture NO GROWTH 4 DAYS  Final   Report Status PENDING  Incomplete  C difficile quick scan w PCR reflex     Status: None   Collection Time: 06/11/15  9:04 AM  Result Value  Ref Range Status   C Diff antigen NEGATIVE NEGATIVE Final   C Diff toxin NEGATIVE NEGATIVE Final   C Diff interpretation Negative for toxigenic C. difficile  Final         Studies: No results found.      Scheduled Meds: . antiseptic oral rinse  7 mL Mouth Rinse BID  . dipyridamole-aspirin  1 capsule Oral BID  . enoxaparin (LOVENOX) injection  40 mg Subcutaneous Q24H  . feeding supplement  1 Container Oral BID BM  . guaiFENesin  1,200 mg Oral BID  . magic mouthwash w/lidocaine  10 mL Oral QID  . pantoprazole  40 mg Oral Daily  . predniSONE  30 mg Oral Q breakfast  . sertraline  25 mg Oral Daily  . thyroid  90 mg Oral QAC breakfast   Continuous Infusions:   Principal Problem:   Acute respiratory failure (HCC) Active Problems:   HYPERCHOLESTEROLEMIA   GERD   Mild dehydration   Hyponatremia   COPD exacerbation (HCC)   History of CVA (cerebrovascular accident)   IBS (irritable bowel syndrome)   H1N1 influenza   Tobacco use disorder   Acute pulmonary edema (HCC)   Acute respiratory failure with hypoxia (HCC)   Nausea vomiting and diarrhea   CAP (community acquired pneumonia)    Time spent: 20 minutes.    Vernell Leep, MD, FACP, FHM. [facility] Pager [phone]  If 7PM-7AM, please  contact night-coverage [email or web address] Password [email or web address] 06/15/2015, 1:30 PM    LOS: 5 days

## 2015-06-16 ENCOUNTER — Inpatient Hospital Stay (HOSPITAL_COMMUNITY): Payer: Medicare Other

## 2015-06-16 DIAGNOSIS — Z8673 Personal history of transient ischemic attack (TIA), and cerebral infarction without residual deficits: Secondary | ICD-10-CM

## 2015-06-16 LAB — BASIC METABOLIC PANEL
Anion gap: 7 (ref 5–15)
BUN: 9 mg/dL (ref 6–20)
CO2: 26 mmol/L (ref 22–32)
Calcium: 8.4 mg/dL — ABNORMAL LOW (ref 8.9–10.3)
Chloride: 97 mmol/L — ABNORMAL LOW (ref 101–111)
Creatinine, Ser: 0.7 mg/dL (ref 0.44–1.00)
GFR calc Af Amer: >60 mL/min (ref >60)
GFR calc non Af Amer: >60 mL/min (ref >60)
Glucose, Bld: 97 mg/dL (ref 65–99)
Potassium: 3.6 mmol/L (ref 3.5–5.1)
Sodium: 130 mmol/L — ABNORMAL LOW (ref 135–145)

## 2015-06-16 LAB — MAGNESIUM: Magnesium: 2 mg/dL (ref 1.7–2.4)

## 2015-06-16 MED ORDER — PREDNISONE 10 MG PO TABS: ORAL_TABLET | ORAL | Status: DC

## 2015-06-16 MED ORDER — ACETAMINOPHEN 325 MG PO TABS: 650.0000 mg | ORAL_TABLET | Freq: Four times a day (QID) | ORAL | Status: DC | PRN

## 2015-06-16 MED ORDER — ALBUTEROL SULFATE HFA 108 (90 BASE) MCG/ACT IN AERS: 2.0000 | INHALATION_SPRAY | Freq: Four times a day (QID) | RESPIRATORY_TRACT | Status: DC | PRN

## 2015-06-16 MED ORDER — PRO-STAT SUGAR FREE PO LIQD
30.0000 mL | Freq: Two times a day (BID) | ORAL | Status: DC
  Administered 2015-06-16: 30 mL via ORAL
  Filled 2015-06-16: qty 30

## 2015-06-16 NOTE — Discharge Instructions (Signed)

## 2015-06-16 NOTE — Progress Notes (Signed)
Called to evaluate patient for some wheezing and possible aspiration issues. Patient breathing okay upon arrival. BBS had slight exp wheeze in the bases with coarse crackles throughout. Patient stated she has had a lot of medicine to swallow and water and in the past has had to have esophageus stretching done and also history of a stroke. Treatment done and No change in the breath sounds. Discussed with RN what I had done and she was contacting MD about possible X ray to check for fluid. Patient had also mentioned possibly wanting a fluid pill. Will monitor as needed.

## 2015-06-16 NOTE — Evaluation (Signed)
Clinical/Bedside Swallow Evaluation Patient Details  Name: NESLY MERRIFIELD MRN: [id number] Date of Birth: [birth year]  Today's Date: 06/16/2015 Time: SLP Start Time (ACUTE ONLY): 1619 SLP Stop Time (ACUTE ONLY): 1638 SLP Time Calculation (min) (ACUTE ONLY): 19 min  Past Medical History:  Past Medical History  Diagnosis Date  . Anxiety   . Depression   . GERD (gastroesophageal reflux disease)   . Hypothyroidism   . Skin cancer of face   . Skin cancer of arm   . Allergy   . COPD (chronic obstructive pulmonary disease) ([place])   . Hyperlipidemia   . Hypertension   . Obesity   . DDD (degenerative disc disease), lumbar     mild  . Stroke ([place]) 07/2010    no permenant deficits  . Osteopenia   . Heart murmur   . Thrombocytopenia ([place])   . Female rectocele without uterine prolapse   . IBS (irritable bowel syndrome)   . Influenza A 06/2015   Past Surgical History:  Past Surgical History  Procedure Laterality Date  . Tubal ligation  1975  . Appendectomy    . Foot surgery  03/31/2009    R foot  . Emgs  7/04    atms and wrists neg  . Cholecystectomy  2004  . Foot surgery  2008, 2009, 2010    right foot   . Esophageal manometry N/A 03/17/2015    Procedure: ESOPHAGEAL MANOMETRY (EM);  Surgeon: Manus Gunning, MD;  Location: [facility] ENDOSCOPY;  Service: Gastroenterology;  Laterality: N/A;   HPI:  65 year old female patient with history of COPD, HTN, HLD, IBS, anxiety/depression, hypothyroid, tobacco abuse, presented to [facility] ED on 06/08/15 with 2-3 day history of nausea, vomiting, diarrhea, cough, DOE, subjective fevers, chills and intermittent headaches. Pt found to have influenza A (H1N1). Pt has a documented h/o esophageal dysphagia, including the need for stretching. Most recent barium swallow (3 completed in 2016) states that there was no airway compromise.   Assessment / Plan / Recommendation Clinical Impression  Pt's oropharyngeal swallow appears WFL. Her reported c/o frequent  eructation and solids getting "stuck" appear more consistent with a primary esophageal component, for which she has seen GI in the recent past. Recommend to continue with regular diet and thin liquids to allow pt to order foods that she knows she can handle. She verbalizes foods she needs to avoid and strategies she needs to use with Mod I. No acute SLP needs identified, but pt may wish to f/u with her GI doctor should symptoms persist or worsen.    Aspiration Risk  Mild aspiration risk    Diet Recommendation Regular;Thin liquid   Liquid Administration via: Cup;Straw Medication Administration: Whole meds with puree Supervision: Patient able to self feed;Intermittent supervision to cue for compensatory strategies Compensations: Slow rate;Small sips/bites;Follow solids with liquid Postural Changes: Seated upright at 90 degrees;Remain upright for at least 30 minutes after po intake    Other  Recommendations Recommended Consults: Consider GI evaluation Oral Care Recommendations: Oral care BID   Follow up Recommendations  None    Frequency and Duration            Prognosis        Swallow Study   General HPI: 65 year old female patient with history of COPD, HTN, HLD, IBS, anxiety/depression, hypothyroid, tobacco abuse, presented to [facility] ED on 06/08/15 with 2-3 day history of nausea, vomiting, diarrhea, cough, DOE, subjective fevers, chills and intermittent headaches. Pt found to have influenza A (H1N1). Pt has a  documented h/o esophageal dysphagia, including the need for stretching. Most recent barium swallow (3 completed in 2016) states that there was no airway compromise. Type of Study: Bedside Swallow Evaluation Previous Swallow Assessment: none in chart Diet Prior to this Study: Regular;Thin liquids Temperature Spikes Noted: No Respiratory Status: Room air History of Recent Intubation: No Behavior/Cognition: Alert;Cooperative;Pleasant mood Oral Cavity Assessment: Within Functional  Limits Oral Care Completed by SLP: No Oral Cavity - Dentition: Adequate natural dentition Vision: Functional for self-feeding Self-Feeding Abilities: Able to feed self Patient Positioning: Upright in bed Baseline Vocal Quality: Normal    Oral/Motor/Sensory Function Overall Oral Motor/Sensory Function: Within functional limits   Ice Chips Ice chips: Not tested   Thin Liquid Thin Liquid: Within functional limits Presentation: Cup;Self Fed;Straw    Nectar Thick Nectar Thick Liquid: Not tested   Honey Thick Honey Thick Liquid: Not tested   Puree Puree: Within functional limits Presentation: Self Fed;Spoon   Solid   GO   Solid: Within functional limits Presentation: Self Fed       Germain Osgood, M.A. CCC-SLP [phone]  Germain Osgood 06/16/2015,4:55 PM

## 2015-06-16 NOTE — Discharge Summary (Addendum)
Physician Discharge Summary  KONNER SHORES [id number] DOB: 1950-12-28 DOA: 06/08/2015  PCP: [name], MD  Admit date: 06/08/2015 Discharge date: 06/16/2015  Time spent: Greater than 30 minutes  Recommendations for Outpatient Follow-up:  1. Dr. [name], PCP in one week with repeat labs (CBC & BMP)  Discharge Diagnoses:  Principal Problem:   Acute respiratory failure ([place]) Active Problems:   HYPERCHOLESTEROLEMIA   GERD   Mild dehydration   Hyponatremia   COPD exacerbation (HCC)   History of CVA (cerebrovascular accident)   IBS (irritable bowel syndrome)   H1N1 influenza   Tobacco use disorder   Acute pulmonary edema (HCC)   Acute respiratory failure with hypoxia (HCC)   Nausea vomiting and diarrhea   CAP (community acquired pneumonia)   Discharge Condition: Improved & Stable  Diet recommendation: Heart healthy and diabetic diet.  Filed Weights   06/11/15 1352 06/13/15 0500 06/14/15 2100  Weight: 73.6 kg (162 lb 4.1 oz) 72.802 kg (160 lb 8 oz) 74.4 kg (164 lb 0.4 oz)    History of present illness:  65 year old female patient with history of COPD, HTN, HLD, IBS, anxiety/depression, hypothyroid, tobacco abuse, presented to [facility] ED on 06/08/15 with 2-3 day history of nausea, vomiting, diarrhea, cough, DOE, subjective fevers, chills and intermittent headaches. In the ED, febrile 102.20F, hypoxic in the 80s. Admitted initially to medical floor for acute hypoxic respiratory failure secondary to influenza, COPD and possible pneumonia. She developed progressive hypoxia and was transferred to ICU under CCM care. She improved and was transferred back to the floor and [place] service on 06/14/15.  Hospital Course:   Acute hypoxic respiratory failure secondary to flu A/H1 N1 pneumonia, possible COPD exacerbation - Treated with Tamiflu, steroids and levofloxacin-discontinued. - Clinically improved. Completed 5 days Tamiflu. Taper steroids rapidly. - Hypoxia resolved. Overnight 1/15,  patient states that she had too many pills to take and drank a lot of water following which she had some dyspnea and wheezing. She hasn't had any since this morning. She has ambulated the hall on room air and is saturating in the mid 90s per are in. Chest x-ray without acute findings.  Possible COPD exacerbation - History of tobacco abuse but no COPD on previous evaluations. - Oxygen, bronchodilator nebulizations and steroid taper. COPD exacerbation has resolved.  Tobacco abuse - Cessation counseled  Essential hypertension - Controlled. Resume amlodipine at discharge. Discontinue HCTZ.  Hyperlipidemia  Hypokalemia  - Replaced.  GERD/esophageal stenosis status post recent dilatation by Dr. [name] - Continue PPI. Diet as tolerated. - Due to concern about her swallowing difficulty, speech therapy was consulted and recommend regular diet and thin liquids.   Oral thrush - Minimal on exam. Resolved after a couple of days of Magic mouthwash.  Nausea, vomiting and diarrhea - ? Part of flu like illness. Nausea and vomiting have resolved.  - C. difficile testing negative. - Patient states that she may be having 1 episode of loose stools daily. Indicated that this may gradually resolved. Advised probiotic yogurts.   Sepsis secondary to flu A/H1 N1 - Sepsis physiology resolved. Blood cultures 2: Negative to date. C. difficile testing negative.  Hypothyroid - Continue Armour Thyroid  History of stroke - Aggrenox to continue  Depression - Continue Zoloft  Hyponatremia - Stable. Initially related to dehydration and HCTZ. Now? SIADH.Discontinued HCTZ indefinitely.  Minimally elevated troponin - Likely secondary to acute illness.    Consultants:  CCM-signed off  Procedures:  Catheter-discontinued  Antimicrobials:  Azithromycin 1 dose  Rocephin  1 dose  Levofloxacin 1/8 > 1/11  Tamiflu 1/9 > 1/14   Discharge Exam:  Complaints: Had some dyspnea overnight after  she took her pills with water. None this morning. Denies any other complaints. Anxious to go home.  Filed Vitals:   06/16/15 0024 06/16/15 0602 06/16/15 1432 06/16/15 1440  BP:  130/59 145/64   Pulse: 78 61 74   Temp:  97.7 F (36.5 C) 98.1 F (36.7 C)   TempSrc:   Oral   Resp: 22 19 19    Height:      Weight:      SpO2: 94% 98% 94% 92%    General exam: Pleasant middle-aged female sitting up comfortably in bed.  Respiratory system: Clear to auscultation. No increased work of breathing. Cardiovascular system: S1 & S2 heard, RRR. No JVD, murmurs, gallops, clicks or pedal edema. Gastrointestinal system: Abdomen is nondistended, soft and nontender. Normal bowel sounds heard. Central nervous system: Alert and oriented. No focal neurological deficits. Extremities: Symmetric 5 x 5 power.  Discharge Instructions      Discharge Instructions    Call MD for:  difficulty breathing, headache or visual disturbances    Complete by:  As directed      Call MD for:  extreme fatigue    Complete by:  As directed      Call MD for:  persistant dizziness or light-headedness    Complete by:  As directed      Call MD for:  persistant nausea and vomiting    Complete by:  As directed      Call MD for:  severe uncontrolled pain    Complete by:  As directed      Call MD for:  temperature >100.4    Complete by:  As directed      Diet - low sodium heart healthy    Complete by:  As directed      Increase activity slowly    Complete by:  As directed             Medication List    STOP taking these medications        BC HEADACHE POWDER PO     hydrochlorothiazide 25 MG tablet  Commonly known as:  HYDRODIURIL     rifaximin 550 MG Tabs tablet  Commonly known as:  XIFAXAN      TAKE these medications        acetaminophen 325 MG tablet  Commonly known as:  TYLENOL  Take 2 tablets (650 mg total) by mouth every 6 (six) hours as needed for mild pain, moderate pain, fever or headache (or Fever  >/= 101).     albuterol 108 (90 Base) MCG/ACT inhaler  Commonly known as:  PROVENTIL HFA;VENTOLIN HFA  Inhale 2 puffs into the lungs every 6 (six) hours as needed for wheezing or shortness of breath.     amLODipine 10 MG tablet  Commonly known as:  NORVASC  TAKE 1 TABLET (10 MG TOTAL) BY MOUTH DAILY.     ARMOUR THYROID 90 MG tablet  Generic drug:  thyroid  TAKE 1 TABLET (90 MG TOTAL) BY MOUTH DAILY.     cetirizine 10 MG tablet  Commonly known as:  ZYRTEC  Take 10 mg by mouth daily.     dipyridamole-aspirin 200-25 MG 12hr capsule  Commonly known as:  AGGRENOX  Take 1 capsule by mouth 2 (two) times daily.     ezetimibe 10 MG tablet  Commonly known as:  ZETIA  Take 1 tablet (10 mg total) by mouth daily.     ondansetron 4 MG tablet  Commonly known as:  ZOFRAN  Take 1 tablet (4 mg total) by mouth every 8 (eight) hours as needed for nausea.     polyethylene glycol powder powder  Commonly known as:  GLYCOLAX/MIRALAX  17 grams daily prn constipation.     predniSONE 10 MG tablet  Commonly known as:  DELTASONE  Take 3 tabs daily for 2 days, then 2 tabs daily for 2 days, then 1 tab daily for 2 days, then stop.     RABEprazole 20 MG tablet  Commonly known as:  ACIPHEX  Take 1 tablet (20 mg total) by mouth 2 (two) times daily.     sertraline 50 MG tablet  Commonly known as:  ZOLOFT  Take 0.5 tablets (25 mg total) by mouth daily.     Vitamin D-3 1000 units Caps  Take 2,000 Units by mouth daily.       Follow-up Information    Follow up with [name], MD. Schedule an appointment as soon as possible for a visit in 1 week.   Specialty:  Family Medicine   Why:  To be seen with repeat labs (CBC & BMP).   Contact information:   [place] [address] [phone]        The results of significant diagnostics from this hospitalization (including imaging, microbiology, ancillary and laboratory) are listed below for reference.    Significant  Diagnostic Studies: Dg Chest Port 1 View  06/16/2015  CLINICAL DATA:  65 year old female with shortness of breath EXAM: PORTABLE CHEST 1 VIEW COMPARISON:  Radiograph dated 06/12/2015 FINDINGS: The heart size and mediastinal contours are within normal limits. Both lungs are clear. The visualized skeletal structures are unremarkable. IMPRESSION: No active disease. Electronically Signed   By: Anner Crete M.D.   On: 06/16/2015 01:43   Dg Chest Port 1 View  06/12/2015  CLINICAL DATA:  Respiratory failure, shortness of breath, history of COPD, CVA, community-acquired pneumonia, current smoker. EXAM: PORTABLE CHEST 1 VIEW COMPARISON:  Portable chest x-ray of April 10, 2016 FINDINGS: The lungs are well-expanded. The pulmonary interstitial markings remain increased but are slightly less conspicuous overall. No discrete alveolar infiltrate is observed. The cardiac silhouette is mildly enlarged. The pulmonary vascularity is not clearly engorged. There is no pleural effusion or pneumothorax. The trachea is midline. The bony thorax exhibits no acute abnormality. IMPRESSION: Slight interval improvement in the pulmonary interstitial infiltrates. Mild cardiomegaly without definite pulmonary vascular congestion. Electronically Signed   By: David  Martinique M.D.   On: 06/12/2015 07:17   Dg Chest Port 1 View  06/11/2015  CLINICAL DATA:  Shortness of breath, mid abdominal pain EXAM: PORTABLE CHEST 1 VIEW COMPARISON:  06/10/2015 FINDINGS: There is diffuse bilateral interstitial thickening and alveolar airspace opacities. There is no pleural effusion or pneumothorax. There is stable cardiomegaly. There is no acute osseous abnormality. IMPRESSION: Bilateral diffuse interstitial and alveolar airspace opacities. Differential diagnosis includes pulmonary edema versus multilobar pneumonia. Electronically Signed   By: Kathreen Devoid   On: 06/11/2015 08:24   Dg Chest Port 1 View  06/10/2015  CLINICAL DATA:  Acute respiratory  failure with hypoxia EXAM: PORTABLE CHEST - 1 VIEW COMPARISON:  06/08/2015 FINDINGS: Cardiac shadow is stable. Diffuse bilateral infiltrates are now seen new from the prior exam. This likely represents congestive failure and pulmonary edema. No sizable effusion is noted. No bony abnormality is seen. IMPRESSION: Diffuse  bilateral infiltrates likely related to a pulmonary edema and congestive failure. Electronically Signed   By: Inez Catalina M.D.   On: 06/10/2015 12:01   Dg Chest Portable 1 View  06/08/2015  CLINICAL DATA:  Patient with cough and shortness of breath. EXAM: PORTABLE CHEST 1 VIEW COMPARISON:  Chest radiograph 01/25/2015. FINDINGS: Monitoring leads overlie the patient. Stable cardiac and mediastinal contours. Minimal heterogeneous opacities within the lung bases bilaterally. No pleural effusion or pneumothorax. IMPRESSION: Bibasilar opacities favored to represent atelectasis. Electronically Signed   By: Lovey Newcomer M.D.   On: 06/08/2015 08:25   Dg Abd Portable 1v  06/11/2015  CLINICAL DATA:  Shortness of breath, mid abdominal pain EXAM: PORTABLE ABDOMEN - 1 VIEW COMPARISON:  None. FINDINGS: Prior scratch at there is evidence prior cholecystectomy. There is no bowel dilatation to suggest obstruction. There is no evidence of pneumoperitoneum, portal venous gas or pneumatosis. There are no pathologic calcifications along the expected course of the ureters. There is a dextrocurvature of the thoracolumbar spine. IMPRESSION: Negative. Electronically Signed   By: Kathreen Devoid   On: 06/11/2015 08:25    Microbiology: Recent Results (from the past 240 hour(s))  Urine culture     Status: None   Collection Time: 06/08/15 10:18 AM  Result Value Ref Range Status   Specimen Description URINE, RANDOM  Final   Special Requests NONE  Final   Culture 10,000 COLONIES/mL YEAST  Final   Report Status 06/09/2015 FINAL  Final  MRSA PCR Screening     Status: None   Collection Time: 06/09/15 12:17 PM  Result  Value Ref Range Status   MRSA by PCR NEGATIVE NEGATIVE Final    Comment:        The GeneXpert MRSA Assay (FDA approved for NASAL specimens only), is one component of a comprehensive MRSA colonization surveillance program. It is not intended to diagnose MRSA infection nor to guide or monitor treatment for MRSA infections.   Culture, blood (Routine X 2) w Reflex to ID Panel     Status: None   Collection Time: 06/10/15 12:25 PM  Result Value Ref Range Status   Specimen Description BLOOD RIGHT HAND  Final   Special Requests IN PEDIATRIC BOTTLE 1CC  Final   Culture NO GROWTH 5 DAYS  Final   Report Status 06/15/2015 FINAL  Final  Culture, blood (Routine X 2) w Reflex to ID Panel     Status: None   Collection Time: 06/10/15 12:33 PM  Result Value Ref Range Status   Specimen Description BLOOD LEFT HAND  Final   Special Requests BOTTLES DRAWN AEROBIC ONLY 6CC  Final   Culture NO GROWTH 5 DAYS  Final   Report Status 06/15/2015 FINAL  Final  C difficile quick scan w PCR reflex     Status: None   Collection Time: 06/11/15  9:04 AM  Result Value Ref Range Status   C Diff antigen NEGATIVE NEGATIVE Final   C Diff toxin NEGATIVE NEGATIVE Final   C Diff interpretation Negative for toxigenic C. difficile  Final     Labs: Basic Metabolic Panel:  Recent Labs Lab 06/11/15 0256 06/11/15 1435 06/12/15 0335 06/13/15 0341 06/15/15 0600 06/16/15 0706  NA 131*  --  131* 132* 130* 130*  K 2.8* 3.9 3.5 3.7 2.8* 3.6  CL 94*  --  96* 95* 91* 97*  CO2 24  --  26 25 29 26   GLUCOSE 91  --  131* 176* 111* 97  BUN 9  --  13 14 13 9   CREATININE 0.64  --  0.68 0.55 0.65 0.70  CALCIUM 8.2*  --  8.5* 8.5* 8.3* 8.4*  MG  --   --  2.2  --   --  2.0  PHOS  --   --  3.0  --   --   --    Liver Function Tests: No results for input(s): AST, ALT, ALKPHOS, BILITOT, PROT, ALBUMIN in the last 168 hours. No results for input(s): LIPASE, AMYLASE in the last 168 hours. No results for input(s): AMMONIA in the  last 168 hours. CBC:  Recent Labs Lab 06/10/15 1230 06/11/15 0256 06/12/15 0335 06/13/15 0341  WBC 15.9* 16.9* 11.2* 8.6  HGB 13.8 13.1 12.5 12.4  HCT 40.5 37.8 36.9 36.2  MCV 85.4 84.9 84.4 84.4  PLT 273 270 277 306   Cardiac Enzymes:  Recent Labs Lab 06/10/15 1230 06/11/15 0031 06/11/15 0256  TROPONINI 0.03 0.06* 0.09*   BNP: BNP (last 3 results)  Recent Labs  06/10/15 1312 06/11/15 0820  BNP 118.1* 75.8    ProBNP (last 3 results) No results for input(s): PROBNP in the last 8760 hours.  CBG:  Recent Labs Lab 06/11/15 1349  GLUCAP 150*       Signed:  Vernell Leep, MD, FACP, FHM. [facility] Pager [phone]  If 7PM-7AM, please contact night-coverage [email or web address] Password [facility] 06/16/2015, 4:59 PM   Addendum (Responding to coding query)  Sepsis from Flu A/H1N1 and possible bacterial superinfection-present on admission.  Vernell Leep, MD, FACP, FHM. [facility] Pager [phone]  If 7PM-7AM, please contact night-coverage [email or web address] Password [facility] 06/17/2015, 5:19 PM

## 2015-06-16 NOTE — Progress Notes (Signed)
Patient called out complaining of shortness of breath. Upon arrival to room patient was experiencing labored breathing, expiratory wheezes and crackles. Started on O2 at 2L and contacted respiratory to evaluate patient. Breathing treatment was given with no change per respiratory. NP on call was notified and chest x-ray was ordered. Results are posted. Lungs looked clear per findings on x-ray. Kept patient on O2 throughout night, no more episodes of shortness of breath were noted. Will continue to monitor. Jimmie Molly, RN

## 2015-06-16 NOTE — Progress Notes (Signed)
AVS given to patient. IVs removed. Understanding demonstrated.  Transportation arranged by patient.

## 2015-06-16 NOTE — Progress Notes (Signed)
Nutrition Follow-up  DOCUMENTATION CODES:   Obesity unspecified  INTERVENTION:   -D/c Boost Breeze po TID, each supplement provides 250 kcal and 9 grams of protein, due to poor acceptance -30 ml Prostat BID, each supplement provides 100 kcals and 15 grams protein  NUTRITION DIAGNOSIS:   Inadequate oral intake related to acute illness as evidenced by per patient/family report.  Ongoing  GOAL:   Patient will meet greater than or equal to 90% of their needs  Unmet  MONITOR:   PO intake, Supplement acceptance, Weight trends, I & O's, Labs  REASON FOR ASSESSMENT:   Consult Assessment of nutrition requirement/status  ASSESSMENT:   65 year old female presents complaining of cough, nausea, vomiting, and diarrhea that began 2 days prior to admission. She has had some fever to 100 at home.  States that she's had approximately 15 episodes of nonbloody diarrhea and vomiting since onset. Influenza A (H1N1) positive.  Pt transferred from ICU to surgical floor on 06/13/15.  Spoke with pt at bedside. She complains of continued poor appetite due to nausea, diarrhea, and oral thrush. Meal completion variable; PO: 35-100%. Pt has been refusing the Boost Breeze supplement, stating it is too sweet. RD will d/c due to poor acceptance.   Pt identifies that thrush and nausea are the largest barriers to adequate intake. She reveals that strong food odors aggravate her nausea and this has been ongoing since PTA; she shares that she is unable to be indoors if foods are being prepared. This RD reviewed menu items with pt that are cold and have minimal odors in order to maximize intake.   Pt amenable to Prostat supplement. Reinforced importance of good meal and supplement intake to promote healing.   Labs reviewed: Na: 130.   Diet Order:  Diet Heart Room service appropriate?: Yes; Fluid consistency:: Thin  Skin:  Reviewed, no issues  Last BM:  06/15/15  Height:   Ht Readings from Last 1  Encounters:  06/11/15 5' (1.524 m)    Weight:   Wt Readings from Last 1 Encounters:  06/14/15 164 lb 0.4 oz (74.4 kg)    Ideal Body Weight:  45.45 kg  BMI:  Body mass index is 32.03 kg/(m^2).  Estimated Nutritional Needs:   Kcal:  1700-1900  Protein:  75-90 grams  Fluid:  1.7 - 1.9 L/day  EDUCATION NEEDS:   No education needs identified at this time  Lashanna Angelo A. Jimmye Norman, RD, LDN, CDE Pager: [phone] After hours Pager: [phone]

## 2015-06-17 ENCOUNTER — Telehealth: Payer: Self-pay | Admitting: *Deleted

## 2015-06-17 MED ORDER — NYSTATIN 100000 UNIT/ML MT SUSP: 5.0000 mL | Freq: Four times a day (QID) | OROMUCOSAL | Status: DC

## 2015-06-17 NOTE — Telephone Encounter (Signed)
Rx for nystatin sent in. Notify.

## 2015-06-17 NOTE — Telephone Encounter (Signed)
Ms. Sarr notified prescription for Nystatin to threat the thrush has been sent into her pharmacy.

## 2015-06-17 NOTE — Telephone Encounter (Signed)
Patient was treated with multiple antibiotics while in the hospital with pneumonia.  She states today she feels like she may have thrush.  She has white patches on her tongue and on the roof her mouth and throat.  It has caused great discomfort with eating and drinking.  She would like a prescription if possible.  She is scheduled for transitional care visit on 06/24/15.  Please advise.

## 2015-06-17 NOTE — Telephone Encounter (Signed)
Transition Care Management Follow-up Telephone Call   Date discharged? 06/16/15   How have you been since you were released from the hospital? Weak, no worse since discharge   Do you understand why you were in the hospital? yes   Do you understand the discharge instructions? yes   Where were you discharged to? home   Items Reviewed:  Medications reviewed: yes  Allergies reviewed: yes  Dietary changes reviewed: yes  Referrals reviewed: no   Functional Questionnaire:   Activities of Daily Living (ADLs):   She states they are independent in the following: ambulation, bathing and hygiene, feeding, continence, grooming, toileting and dressing States they require assistance with the following: none   Any transportation issues/concerns?: no   Any patient concerns? yes, thrush/difficulty eating   Confirmed importance and date/time of follow-up visits scheduled yes, 06/24/15 @ 1330  Provider Appointment booked with [name], MD  Confirmed with patient if condition begins to worsen call PCP or go to the ER.  Patient was given the office number and encouraged to call back with question or concerns.  : yes

## 2015-06-24 ENCOUNTER — Ambulatory Visit (INDEPENDENT_AMBULATORY_CARE_PROVIDER_SITE_OTHER): Payer: Medicare Other | Admitting: Family Medicine

## 2015-06-24 ENCOUNTER — Encounter: Payer: Self-pay | Admitting: Family Medicine

## 2015-06-24 VITALS — HR 72 | Temp 98.2°F | Ht 59.5 in | Wt 162.2 lb

## 2015-06-24 DIAGNOSIS — R300 Dysuria: Secondary | ICD-10-CM | POA: Diagnosis not present

## 2015-06-24 DIAGNOSIS — E871 Hypo-osmolality and hyponatremia: Secondary | ICD-10-CM

## 2015-06-24 DIAGNOSIS — J189 Pneumonia, unspecified organism: Secondary | ICD-10-CM | POA: Diagnosis not present

## 2015-06-24 DIAGNOSIS — J101 Influenza due to other identified influenza virus with other respiratory manifestations: Secondary | ICD-10-CM

## 2015-06-24 DIAGNOSIS — J9601 Acute respiratory failure with hypoxia: Secondary | ICD-10-CM | POA: Diagnosis not present

## 2015-06-24 DIAGNOSIS — J441 Chronic obstructive pulmonary disease with (acute) exacerbation: Secondary | ICD-10-CM

## 2015-06-24 DIAGNOSIS — R21 Rash and other nonspecific skin eruption: Secondary | ICD-10-CM

## 2015-06-24 LAB — POC URINALSYSI DIPSTICK (AUTOMATED)
Bilirubin, UA: NEGATIVE
Glucose, UA: NEGATIVE
Ketones, UA: NEGATIVE
Leukocytes, UA: NEGATIVE
Nitrite, UA: NEGATIVE
Protein, UA: NEGATIVE
Spec Grav, UA: >=1.03
Urobilinogen, UA: 0.2
pH, UA: 6

## 2015-06-24 MED ORDER — NYSTATIN 100000 UNIT/GM EX CREA: 1.0000 "application " | TOPICAL_CREAM | Freq: Two times a day (BID) | CUTANEOUS | Status: DC

## 2015-06-24 NOTE — Assessment & Plan Note (Signed)
Resolved

## 2015-06-24 NOTE — Assessment & Plan Note (Signed)
Due for re-eval off HCTZ, diarrhea resolved.

## 2015-06-24 NOTE — Assessment & Plan Note (Signed)
Resolved per symptoms. Re-eval cbc as requested by hospitalist.

## 2015-06-24 NOTE — Assessment & Plan Note (Addendum)
Likely candidal interttrigo treat with topical nystatin

## 2015-06-24 NOTE — Patient Instructions (Addendum)
As able start back on sertraline and amlodipine. Can use nausea medicaiton as needed. Follow BP at home to make sure not elevated > 140/90.   Stop at lab on way out.

## 2015-06-24 NOTE — Progress Notes (Signed)
Subjective:    Patient ID: Tiffany Orozco, female    DOB: 1950-11-20, 65 y.o.   MRN: [id number]  HPI   65 year old female presents following a hospitalization on  06/08/2015 (until 1/18) for influenza, community acquired pneumonia, acute respiratory failure with hypoxia, acute pulmonary edema, mild dehydration, COPD exacerbation.  She presented to ED with 2-3 days history of nausea, vomiting, diarrhea, cough, DOE, subjective fevers, chills and intermittent headaches. In the ED, febrile 102.8F, hypoxic in the 80s. Admitted initially to medical floor for acute hypoxic respiratory failure secondary to influenza, COPD and possible pneumonia. She developed progressive hypoxia and was transferred to ICU under CCM care. She improved and was transferred back to the floor and [place] service on 06/14/15.  Acute hypoxic respiratory failure secondary to flu A/H1 N1 pneumonia, possible COPD exacerbation - Treated with Tamiflu, steroids and levofloxacin. Did initially have azithromycin and rocephin x 1 dose. Hypoxia resolved.  Possible COPD exacerbation - History of tobacco abuse but no COPD on previous evaluations. - Oxygen, bronchodilator nebulizations and steroid taper. COPD exacerbation has resolved.  Hypokalemia  - Replaced.  GERD/esophageal stenosis status post recent dilatation by Dr. [name] - Continue PPI. Diet as tolerated. - Due to concern about her swallowing difficulty, speech therapy was consulted and recommend regular diet and thin liquids.   Oral thrush - Minimal on exam. Resolved after a couple of days of Magic mouthwash. Needs repeat cbc and CMET per hospitalist per discharge note.  Nausea, vomiting and diarrhea - ? Part of flu like illness. Nausea and vomiting have resolved.  - C. difficile testing negative. - Patient states that she may be having 1 episode of loose stools daily. Indicated that this may gradually resolved. Advised probiotic yogurts.   Sepsis secondary to flu  A/H1 N1 - Sepsis physiology resolved. Blood cultures 2: Negative to date. C. difficile testing negative.   Hyponatremia - Stable. Initially related to dehydration and HCTZ. Now? SIADH.Discontinued HCTZ indefinitely.  Minimally elevated troponin - Likely secondary to acute illness.   Recommended follow up cbc and CMET 1 week after d/c  Today she reports she is doing much better now. Still slightly nauseous, no BM  In last few days.   She is eating now, if using nausea med every 8 hours no emesis. No SOB, no wheeze.  She is weak all over.  Occ burning with urination but wonders if it is from all the irritation from diarrhea. She did have catherter in hospital.  BP slightly elevated off HCTZ. Has not been using regularly anyway. HAs not been taking amlodipine given throw is up at times. She feels it is up because she has been feeling ill. BP Readings from Last 3 Encounters:  06/16/15 145/64  05/30/15 100/60  05/15/15 148/72   She has noted rash on bilateral groin inner thighs, painful, gradually getting better... Ongoing since 1/10. Less swollen and puffy. She is applying neosporin and lotion. Skin peeling off. Not itching.  Ritta Slot has resolved.   Review of Systems  Constitutional: Negative for fever and fatigue.  HENT: Negative for ear pain.   Eyes: Negative for pain.  Respiratory: Negative for chest tightness and shortness of breath.   Cardiovascular: Negative for chest pain, palpitations and leg swelling.  Gastrointestinal: Positive for nausea. Negative for abdominal pain, diarrhea and rectal pain.  Genitourinary: Positive for dysuria and frequency.       Objective:   Physical Exam  Constitutional: Vital signs are normal. She appears well-developed and well-nourished.  She is cooperative.  Non-toxic appearance. She does not appear ill. No distress.  Weak appearing in NAD  HENT:  Head: Normocephalic.  Right Ear: Hearing, tympanic membrane, external ear and ear canal  normal. Tympanic membrane is not erythematous, not retracted and not bulging.  Left Ear: Hearing, tympanic membrane, external ear and ear canal normal. Tympanic membrane is not erythematous, not retracted and not bulging.  Nose: No mucosal edema or rhinorrhea. Right sinus exhibits no maxillary sinus tenderness and no frontal sinus tenderness. Left sinus exhibits no maxillary sinus tenderness and no frontal sinus tenderness.  Mouth/Throat: Uvula is midline, oropharynx is clear and moist and mucous membranes are normal.  Eyes: Conjunctivae, EOM and lids are normal. Pupils are equal, round, and reactive to light. Lids are everted and swept, no foreign bodies found.  Neck: Trachea normal and normal range of motion. Neck supple. Carotid bruit is not present. No thyroid mass and no thyromegaly present.  Cardiovascular: Normal rate, regular rhythm, S1 normal, S2 normal, normal heart sounds, intact distal pulses and normal pulses.  Exam reveals no gallop and no friction rub.   No murmur heard. Pulmonary/Chest: Effort normal and breath sounds normal. No tachypnea. No respiratory distress. She has no decreased breath sounds. She has no wheezes. She has no rhonchi. She has no rales.  Abdominal: Soft. Normal appearance and bowel sounds are normal. There is no tenderness.  Neurological: She is alert.  Skin: Skin is warm, dry and intact. No rash noted.  Psychiatric: Her speech is normal and behavior is normal. Judgment and thought content normal. Her mood appears not anxious. Cognition and memory are normal. She does not exhibit a depressed mood.          Assessment & Plan:

## 2015-06-24 NOTE — Assessment & Plan Note (Signed)
Urine clear. Likely due to irritation.

## 2015-06-25 LAB — CBC WITH DIFFERENTIAL/PLATELET
Basophils Absolute: 0.2 10*3/uL — ABNORMAL HIGH (ref 0.0–0.1)
Basophils Relative: 1.7 % (ref 0.0–3.0)
Eosinophils Absolute: 0 10*3/uL (ref 0.0–0.7)
Eosinophils Relative: 0.3 % (ref 0.0–5.0)
HCT: 42.4 % (ref 36.0–46.0)
Hemoglobin: 13.9 g/dL (ref 12.0–15.0)
Lymphocytes Relative: 26 % (ref 12.0–46.0)
Lymphs Abs: 2.3 10*3/uL (ref 0.7–4.0)
MCHC: 32.7 g/dL (ref 30.0–36.0)
MCV: 88.6 fl (ref 78.0–100.0)
Monocytes Absolute: 0.7 10*3/uL (ref 0.1–1.0)
Monocytes Relative: 8.3 % (ref 3.0–12.0)
Neutro Abs: 5.7 10*3/uL (ref 1.4–7.7)
Neutrophils Relative %: 63.7 % (ref 43.0–77.0)
Platelets: 546 10*3/uL — ABNORMAL HIGH (ref 150.0–400.0)
RBC: 4.79 Mil/uL (ref 3.87–5.11)
RDW: 15.5 % (ref 11.5–15.5)
WBC: 9 10*3/uL (ref 4.0–10.5)

## 2015-06-25 LAB — COMPREHENSIVE METABOLIC PANEL
ALT: 20 U/L (ref 0–35)
AST: 22 U/L (ref 0–37)
Albumin: 3.9 g/dL (ref 3.5–5.2)
Alkaline Phosphatase: 78 U/L (ref 39–117)
BUN: 14 mg/dL (ref 6–23)
CO2: 27 mEq/L (ref 19–32)
Calcium: 9.3 mg/dL (ref 8.4–10.5)
Chloride: 95 mEq/L — ABNORMAL LOW (ref 96–112)
Creatinine, Ser: 0.89 mg/dL (ref 0.40–1.20)
GFR: 67.3 mL/min (ref >60.00)
Glucose, Bld: 88 mg/dL (ref 70–99)
Potassium: 5.2 mEq/L — ABNORMAL HIGH (ref 3.5–5.1)
Sodium: 129 mEq/L — ABNORMAL LOW (ref 135–145)
Total Bilirubin: 0.3 mg/dL (ref 0.2–1.2)
Total Protein: 7.4 g/dL (ref 6.0–8.3)

## 2015-07-09 ENCOUNTER — Other Ambulatory Visit (INDEPENDENT_AMBULATORY_CARE_PROVIDER_SITE_OTHER): Payer: Medicare Other

## 2015-07-09 DIAGNOSIS — D473 Essential (hemorrhagic) thrombocythemia: Secondary | ICD-10-CM | POA: Diagnosis not present

## 2015-07-09 DIAGNOSIS — R7989 Other specified abnormal findings of blood chemistry: Secondary | ICD-10-CM

## 2015-07-09 DIAGNOSIS — E871 Hypo-osmolality and hyponatremia: Secondary | ICD-10-CM | POA: Diagnosis not present

## 2015-07-09 DIAGNOSIS — E875 Hyperkalemia: Secondary | ICD-10-CM

## 2015-07-09 LAB — BASIC METABOLIC PANEL
BUN: 11 mg/dL (ref 6–23)
CO2: 24 mEq/L (ref 19–32)
Calcium: 9.5 mg/dL (ref 8.4–10.5)
Chloride: 103 mEq/L (ref 96–112)
Creatinine, Ser: 0.73 mg/dL (ref 0.40–1.20)
GFR: 84.58 mL/min (ref >60.00)
Glucose, Bld: 93 mg/dL (ref 70–99)
Potassium: 4.5 mEq/L (ref 3.5–5.1)
Sodium: 136 mEq/L (ref 135–145)

## 2015-07-09 LAB — CBC WITH DIFFERENTIAL/PLATELET
Basophils Absolute: 0 10*3/uL (ref 0.0–0.1)
Basophils Relative: 0.4 % (ref 0.0–3.0)
Eosinophils Absolute: 0.2 10*3/uL (ref 0.0–0.7)
Eosinophils Relative: 2 % (ref 0.0–5.0)
HCT: 42.6 % (ref 36.0–46.0)
Hemoglobin: 14 g/dL (ref 12.0–15.0)
Lymphocytes Relative: 38.8 % (ref 12.0–46.0)
Lymphs Abs: 3.1 10*3/uL (ref 0.7–4.0)
MCHC: 32.8 g/dL (ref 30.0–36.0)
MCV: 87.4 fl (ref 78.0–100.0)
Monocytes Absolute: 0.8 10*3/uL (ref 0.1–1.0)
Monocytes Relative: 9.7 % (ref 3.0–12.0)
Neutro Abs: 3.9 10*3/uL (ref 1.4–7.7)
Neutrophils Relative %: 49.1 % (ref 43.0–77.0)
Platelets: 412 10*3/uL — ABNORMAL HIGH (ref 150.0–400.0)
RBC: 4.87 Mil/uL (ref 3.87–5.11)
RDW: 15.9 % — ABNORMAL HIGH (ref 11.5–15.5)
WBC: 8 10*3/uL (ref 4.0–10.5)

## 2015-07-21 DIAGNOSIS — Z1231 Encounter for screening mammogram for malignant neoplasm of breast: Secondary | ICD-10-CM | POA: Diagnosis not present

## 2015-07-23 ENCOUNTER — Encounter: Payer: Self-pay | Admitting: Family Medicine

## 2015-07-23 ENCOUNTER — Other Ambulatory Visit: Payer: Self-pay | Admitting: *Deleted

## 2015-07-23 MED ORDER — SERTRALINE HCL 50 MG PO TABS: 50.0000 mg | ORAL_TABLET | Freq: Every day | ORAL | Status: DC

## 2015-08-06 ENCOUNTER — Other Ambulatory Visit: Payer: Self-pay | Admitting: Family Medicine

## 2015-08-19 ENCOUNTER — Other Ambulatory Visit: Payer: Self-pay | Admitting: Acute Care

## 2015-08-19 DIAGNOSIS — F17210 Nicotine dependence, cigarettes, uncomplicated: Secondary | ICD-10-CM

## 2015-09-06 ENCOUNTER — Other Ambulatory Visit: Payer: Self-pay | Admitting: Family Medicine

## 2015-09-09 ENCOUNTER — Ambulatory Visit: Payer: Medicare Other | Admitting: Internal Medicine

## 2015-09-10 ENCOUNTER — Encounter: Payer: Self-pay | Admitting: Family Medicine

## 2015-09-10 ENCOUNTER — Ambulatory Visit (INDEPENDENT_AMBULATORY_CARE_PROVIDER_SITE_OTHER): Payer: Medicare Other | Admitting: Family Medicine

## 2015-09-10 VITALS — BP 122/74 | HR 73 | Temp 97.6°F | Wt 166.5 lb

## 2015-09-10 DIAGNOSIS — R112 Nausea with vomiting, unspecified: Secondary | ICD-10-CM

## 2015-09-10 MED ORDER — ONDANSETRON HCL 4 MG PO TABS: 4.0000 mg | ORAL_TABLET | Freq: Three times a day (TID) | ORAL | Status: DC | PRN

## 2015-09-10 NOTE — Assessment & Plan Note (Signed)
Deteriorated. Cyclic in nature. Followed by PCP and GI. eRx sent for zofran.

## 2015-09-10 NOTE — Progress Notes (Signed)
Pre visit review using our clinic review tool, if applicable. No additional management support is needed unless otherwise documented below in the visit note. 

## 2015-09-10 NOTE — Progress Notes (Signed)
Subjective:   Patient ID: Tiffany Orozco, female    DOB: 08-02-1950, 65 y.o.   MRN: [id number]  Tiffany Orozco is a pleasant 65 y.o. year old complex female pt of Dr. Diona Browner, new to me, who presents to clinic today with Emesis  on 09/10/2015  HPI: Per pt, followed by GI for chronic cyclical vomiting and belching. Last saw GI on 03/12/15.  Note reviewed.  Has had a significant work up. zofran has been effective.  Ran out of zofran and was told she needed to be seen today to get a refill.  Last vomited two days ago.  Seeing PCP next month.  Current Outpatient Prescriptions on File Prior to Visit  Medication Sig Dispense Refill  . acetaminophen (TYLENOL) 325 MG tablet Take 2 tablets (650 mg total) by mouth every 6 (six) hours as needed for mild pain, moderate pain, fever or headache (or Fever >/= 101).    Marland Kitchen albuterol (PROVENTIL HFA;VENTOLIN HFA) 108 (90 Base) MCG/ACT inhaler Inhale 2 puffs into the lungs every 6 (six) hours as needed for wheezing or shortness of breath. 1 Inhaler 0  . amLODipine (NORVASC) 10 MG tablet TAKE 1 TABLET (10 MG TOTAL) BY MOUTH DAILY. 30 tablet 5  . ARMOUR THYROID 90 MG tablet TAKE 1 TABLET (90 MG TOTAL) BY MOUTH DAILY. 90 tablet 3  . cetirizine (ZYRTEC) 10 MG tablet Take 10 mg by mouth daily.    . Cholecalciferol (VITAMIN D-3) 1000 UNITS CAPS Take 2,000 Units by mouth daily.    Marland Kitchen dipyridamole-aspirin (AGGRENOX) 200-25 MG 12hr capsule Take 1 capsule by mouth 2 (two) times daily. 180 capsule 1  . ezetimibe (ZETIA) 10 MG tablet Take 1 tablet (10 mg total) by mouth daily. 30 tablet 11  . nystatin (MYCOSTATIN) 100000 UNIT/ML suspension Take 5 mLs (500,000 Units total) by mouth 4 (four) times daily. 180 mL 0  . nystatin cream (MYCOSTATIN) Apply 1 application topically 2 (two) times daily. 30 g 0  . polyethylene glycol powder (GLYCOLAX/MIRALAX) powder MIX 1 CAPFUL (17 GRAMS) IN 8 OUNCES OF LIQUID AND DRINK DAILY AS NEEDED FOR CONSTIPATION. 527 g 2  .  RABEprazole (ACIPHEX) 20 MG tablet Take 1 tablet (20 mg total) by mouth 2 (two) times daily. 120 tablet 3  . sertraline (ZOLOFT) 50 MG tablet Take 1 tablet (50 mg total) by mouth daily. 90 tablet 1   No current facility-administered medications on file prior to visit.    Allergies  Allergen Reactions  . Lisinopril     Shortness of breath.  . Niacin     REACTION: dizziness  . Welchol [Colesevelam Hcl] Other (See Comments)    Diarrhea,Nausea, swelling in face  . Augmentin [Amoxicillin-Pot Clavulanate]     Nauseated. GI upset with taking.  . Codeine     keeps patient awake  . Oxycodone Hcl     Patient states this medication makes her hyper    Past Medical History  Diagnosis Date  . Anxiety   . Depression   . GERD (gastroesophageal reflux disease)   . Hypothyroidism   . Skin cancer of face   . Skin cancer of arm   . Allergy   . COPD (chronic obstructive pulmonary disease) ([place])   . Hyperlipidemia   . Hypertension   . Obesity   . DDD (degenerative disc disease), lumbar     mild  . Stroke ([place]) 07/2010    no permenant deficits  . Osteopenia   . Heart murmur   .  Thrombocytopenia ([place])   . Female rectocele without uterine prolapse   . IBS (irritable bowel syndrome)   . Influenza A 06/2015    Past Surgical History  Procedure Laterality Date  . Tubal ligation  1975  . Appendectomy    . Foot surgery  03/31/2009    R foot  . Emgs  7/04    atms and wrists neg  . Cholecystectomy  2004  . Foot surgery  2008, 2009, 2010    right foot   . Esophageal manometry N/A 03/17/2015    Procedure: ESOPHAGEAL MANOMETRY (EM);  Surgeon: Manus Gunning, MD;  Location: [facility];  Service: Gastroenterology;  Laterality: N/A;    Family History  Problem Relation Age of Onset  . Skin cancer Father   . Hypertension Mother     Social History   Social History  . Marital Status: Married    Spouse Name: Tivona Winningham  . Number of Children: 2  . 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 Smoking status: Current Every Day Smoker -- 1.00 packs/day for 31 years    Types: Cigarettes  . Smokeless tobacco: Never Used     Comment: May try when husband finished treatment for prostate cancer  . Alcohol Use: No  . Drug Use: No  . Sexual Activity: Yes    Birth Control/ Protection: None   Other Topics Concern  . Not on file   Social History Narrative   5/10 husband had open heart surgery, not working, 2 grandchildren, siblings living and healthy   The PMH, PSH, Social History, Family History, Medications, and allergies have been reviewed in [facility], and have been updated if relevant.   Review of Systems  Constitutional: Negative for fever.  Gastrointestinal: Positive for nausea and vomiting. Negative for abdominal pain, diarrhea, constipation and abdominal distention.  Genitourinary: Negative.   Musculoskeletal: Negative.        Objective:    BP 122/74 mmHg  Pulse 73  Temp(Src) 97.6 F (36.4 C) (Oral)  Wt 166 lb 8 oz (75.524 kg)  SpO2 97%   Physical Exam  Constitutional: She is oriented to person, place, and time. She appears well-nourished. No distress.  Belches multiple times  HENT:  Head: Normocephalic.  Eyes: Conjunctivae are normal.  Cardiovascular: Normal rate.   Pulmonary/Chest: Effort normal.  Abdominal: Soft.  Musculoskeletal: Normal range of motion.  Neurological: She is alert and oriented to person, place, and time. No cranial nerve deficit.  Skin: Skin is warm. She is not diaphoretic.  Psychiatric: She has a normal mood and affect. Her behavior is normal. Judgment and thought content normal.  Nursing note and vitals reviewed.         Assessment & Plan:   Nausea and vomiting, intractability of vomiting not specified, unspecified vomiting type No Follow-up on file.

## 2015-09-19 ENCOUNTER — Telehealth: Payer: Self-pay | Admitting: *Deleted

## 2015-09-19 NOTE — Telephone Encounter (Signed)
Oncology Nurse Navigator Documentation  Oncology Nurse Navigator Flowsheets 09/19/2015  Navigator Encounter Type Telephone  Telephone Outgoing Call  Barriers/Navigation Needs Education  Education Smoking cessation  Interventions Education Method  Education Method Verbal  Acuity Level 1  Time Spent with Patient 15   I called to check on patient and her smoking cessation.  Patient states that she is still smoking and unable to quit.  She states she is going through a lot and can't quit right now.  I offered some suggestions to help her quit.  I asked that she call me when she needs my help.  She was thankful for the call.

## 2015-09-26 ENCOUNTER — Encounter: Payer: Self-pay | Admitting: Primary Care

## 2015-09-26 ENCOUNTER — Ambulatory Visit (INDEPENDENT_AMBULATORY_CARE_PROVIDER_SITE_OTHER): Payer: Medicare Other | Admitting: Primary Care

## 2015-09-26 VITALS — BP 122/76 | HR 72 | Temp 98.0°F | Ht 59.5 in | Wt 170.4 lb

## 2015-09-26 DIAGNOSIS — J309 Allergic rhinitis, unspecified: Secondary | ICD-10-CM

## 2015-09-26 DIAGNOSIS — L659 Nonscarring hair loss, unspecified: Secondary | ICD-10-CM

## 2015-09-26 LAB — CBC WITH DIFFERENTIAL/PLATELET
Basophils Absolute: 0.1 10*3/uL (ref 0.0–0.1)
Basophils Relative: 0.6 % (ref 0.0–3.0)
Eosinophils Absolute: 0.3 10*3/uL (ref 0.0–0.7)
Eosinophils Relative: 2.9 % (ref 0.0–5.0)
HCT: 42.3 % (ref 36.0–46.0)
Hemoglobin: 14.1 g/dL (ref 12.0–15.0)
Lymphocytes Relative: 36.5 % (ref 12.0–46.0)
Lymphs Abs: 3.2 10*3/uL (ref 0.7–4.0)
MCHC: 33.5 g/dL (ref 30.0–36.0)
MCV: 85.6 fl (ref 78.0–100.0)
Monocytes Absolute: 0.7 10*3/uL (ref 0.1–1.0)
Monocytes Relative: 8.2 % (ref 3.0–12.0)
Neutro Abs: 4.5 10*3/uL (ref 1.4–7.7)
Neutrophils Relative %: 51.8 % (ref 43.0–77.0)
Platelets: 384 10*3/uL (ref 150.0–400.0)
RBC: 4.94 Mil/uL (ref 3.87–5.11)
RDW: 15 % (ref 11.5–15.5)
WBC: 8.8 10*3/uL (ref 4.0–10.5)

## 2015-09-26 LAB — COMPREHENSIVE METABOLIC PANEL
ALT: 22 U/L (ref 0–35)
AST: 17 U/L (ref 0–37)
Albumin: 4.1 g/dL (ref 3.5–5.2)
Alkaline Phosphatase: 98 U/L (ref 39–117)
BUN: 9 mg/dL (ref 6–23)
CO2: 26 mEq/L (ref 19–32)
Calcium: 9.6 mg/dL (ref 8.4–10.5)
Chloride: 98 mEq/L (ref 96–112)
Creatinine, Ser: 0.69 mg/dL (ref 0.40–1.20)
GFR: 90.21 mL/min (ref >60.00)
Glucose, Bld: 86 mg/dL (ref 70–99)
Potassium: 4.5 mEq/L (ref 3.5–5.1)
Sodium: 131 mEq/L — ABNORMAL LOW (ref 135–145)
Total Bilirubin: 0.2 mg/dL (ref 0.2–1.2)
Total Protein: 7.2 g/dL (ref 6.0–8.3)

## 2015-09-26 LAB — TSH: TSH: 0.93 u[IU]/mL (ref 0.35–4.50)

## 2015-09-26 LAB — T4, FREE: Free T4: 0.68 ng/dL (ref 0.60–1.60)

## 2015-09-26 LAB — T3, FREE: T3, Free: 4.1 pg/mL (ref 2.3–4.2)

## 2015-09-26 NOTE — Patient Instructions (Signed)
Complete lab work prior to leaving today. I will notify you of your results once received.   Try Flonase or Nasacort for ear fullness. Restart Zyrtec for several days.  It was a pleasure meeting you!

## 2015-09-26 NOTE — Progress Notes (Signed)
Subjective:    Patient ID: Tiffany Orozco, female    DOB: April 11, 1951, 65 y.o.   MRN: [id number]  HPI  Tiffany Orozco is a 65 year old female who presents today with multiple complaints.  1) Hair Loss: History of hypothyroidism and currently managed on Armour Thyroid 90 mcg. Her last TSH was normal in November 2016. Her hair loss has been worse over the past 1 week. She also reports fatigue that has been present since hospitalization in January 2017. Denies unexplained weight loss, fevers, cold intolerance. She has noticed a few sores to her scalp. No new soaps, hair care products, foods. She is brushing out clumps of hair.   2) Ear Fullness: Present to bilateral ears, more so to the left. History of allergic rhinitis and takes Zyrtec occasionally. She has not recently taken as it dries her up too much. Denies cough, fevers, sinus pressure.   Review of Systems  Constitutional: Positive for fatigue. Negative for unexpected weight change.  Respiratory: Negative for shortness of breath.   Cardiovascular: Negative for chest pain.  Endocrine: Negative for cold intolerance.  Skin:       Hair loss  Neurological: Negative for weakness and numbness.       Past Medical History  Diagnosis Date  . Anxiety   . Depression   . GERD (gastroesophageal reflux disease)   . Hypothyroidism   . Skin cancer of face   . Skin cancer of arm   . Allergy   . COPD (chronic obstructive pulmonary disease) ([place])   . Hyperlipidemia   . Hypertension   . Obesity   . DDD (degenerative disc disease), lumbar     mild  . Stroke ([place]) 07/2010    no permenant deficits  . Osteopenia   . Heart murmur   . Thrombocytopenia ([place])   . Female rectocele without uterine prolapse   . IBS (irritable bowel syndrome)   . Influenza A 06/2015     Social History   Social History  . Marital Status: Married    Spouse Name: Tiffany Orozco  . Number of Children: 2  . 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 Smoking status: Current Every Day Smoker -- 1.00 packs/day for 31 years    Types: Cigarettes  . Smokeless tobacco: Never Used     Comment: May try when husband finished treatment for prostate cancer  . Alcohol Use: No  . Drug Use: No  . Sexual Activity: Yes    Birth Control/ Protection: None   Other Topics Concern  . Not on file   Social History Narrative   50/10 husband had open heart surgery, not working, 2 grandchildren, siblings living and healthy    Past Surgical History  Procedure Laterality Date  . Tubal ligation  1975  . Appendectomy    . Foot surgery  03/31/2009    R foot  . Emgs  7/04    atms and wrists neg  . Cholecystectomy  2004  . Foot surgery  2008, 2009, 2010    right foot   . Esophageal manometry N/A 03/17/2015    Procedure: ESOPHAGEAL MANOMETRY (EM);  Surgeon: Manus Gunning, MD;  Location: [facility];  Service: Gastroenterology;  Laterality: N/A;    Family History  Problem Relation Age of Onset  . Skin cancer Father   . Hypertension Mother  Allergies  Allergen Reactions  . Lisinopril     Shortness of breath.  . Niacin     REACTION: dizziness  . Welchol [Colesevelam Hcl] Other (See Comments)    Diarrhea,Nausea, swelling in face  . Augmentin [Amoxicillin-Pot Clavulanate]     Nauseated. GI upset with taking.  . Codeine     keeps patient awake  . Oxycodone Hcl     Patient states this medication makes her hyper    Current Outpatient Prescriptions on File Prior to Visit  Medication Sig Dispense Refill  . amLODipine (NORVASC) 10 MG tablet TAKE 1 TABLET (10 MG TOTAL) BY MOUTH DAILY. 30 tablet 5  . ARMOUR THYROID 90 MG tablet TAKE 1 TABLET (90 MG TOTAL) BY MOUTH DAILY. 90 tablet 3  . ezetimibe (ZETIA) 10 MG tablet Take 1 tablet (10 mg total) by mouth daily. 30 tablet 11  . ondansetron (ZOFRAN) 4 MG tablet Take 1 tablet (4 mg total) by mouth every 8 (eight)  hours as needed for nausea. 60 tablet 1  . polyethylene glycol powder (GLYCOLAX/MIRALAX) powder MIX 1 CAPFUL (17 GRAMS) IN 8 OUNCES OF LIQUID AND DRINK DAILY AS NEEDED FOR CONSTIPATION. 527 g 2  . RABEprazole (ACIPHEX) 20 MG tablet Take 1 tablet (20 mg total) by mouth 2 (two) times daily. 120 tablet 3  . sertraline (ZOLOFT) 50 MG tablet Take 1 tablet (50 mg total) by mouth daily. 90 tablet 1  . acetaminophen (TYLENOL) 325 MG tablet Take 2 tablets (650 mg total) by mouth every 6 (six) hours as needed for mild pain, moderate pain, fever or headache (or Fever >/= 101). (Patient not taking: Reported on 09/26/2015)    . albuterol (PROVENTIL HFA;VENTOLIN HFA) 108 (90 Base) MCG/ACT inhaler Inhale 2 puffs into the lungs every 6 (six) hours as needed for wheezing or shortness of breath. (Patient not taking: Reported on 09/26/2015) 1 Inhaler 0  . cetirizine (ZYRTEC) 10 MG tablet Take 10 mg by mouth daily. Reported on 09/26/2015    . Cholecalciferol (VITAMIN D-3) 1000 UNITS CAPS Take 2,000 Units by mouth daily. Reported on 09/26/2015    . dipyridamole-aspirin (AGGRENOX) 200-25 MG 12hr capsule Take 1 capsule by mouth 2 (two) times daily. 180 capsule 1  . nystatin (MYCOSTATIN) 100000 UNIT/ML suspension Take 5 mLs (500,000 Units total) by mouth 4 (four) times daily. (Patient not taking: Reported on 09/26/2015) 180 mL 0  . nystatin cream (MYCOSTATIN) Apply 1 application topically 2 (two) times daily. (Patient not taking: Reported on 09/26/2015) 30 g 0   No current facility-administered medications on file prior to visit.    BP 122/76 mmHg  Pulse 72  Temp(Src) 98 F (36.7 C) (Oral)  Ht 4' 11.5" (1.511 m)  Wt 170 lb 6.4 oz (77.293 kg)  BMI 33.85 kg/m2  SpO2 98%    Objective:   Physical Exam  Constitutional: She appears well-nourished.  Neck: Neck supple. No thyromegaly present.  Cardiovascular: Normal rate and regular rhythm.   Pulmonary/Chest: Effort normal and breath sounds normal.  Skin: Skin is warm and  dry.  Moderate amount of hair loss. During exam accidentally pull some out. Patient with sample clump of hair from home.           Assessment & Plan:  Hair Loss:  History of in the past, more severe in the past 1 month. Moderate hair loss present today. Accidentally pulled some out during exam. No thyroidmegaly. Will recheck thyroid tests, CBC, CMP today to rule out any other cause. No alarm  signs which is reassuring.

## 2015-09-26 NOTE — Assessment & Plan Note (Signed)
Ear fullness bilaterally, likely caused by seasonal allergies. Will have her resume Zyrtec and start Flonase. Exam unremarkable.

## 2015-09-26 NOTE — Progress Notes (Signed)
Pre visit review using our clinic review tool, if applicable. No additional management support is needed unless otherwise documented below in the visit note. 

## 2015-10-01 ENCOUNTER — Encounter: Payer: Self-pay | Admitting: Gastroenterology

## 2015-10-01 ENCOUNTER — Ambulatory Visit (INDEPENDENT_AMBULATORY_CARE_PROVIDER_SITE_OTHER): Payer: Medicare Other | Admitting: Gastroenterology

## 2015-10-01 VITALS — BP 130/72 | HR 100 | Ht 59.5 in | Wt 172.0 lb

## 2015-10-01 DIAGNOSIS — K589 Irritable bowel syndrome without diarrhea: Secondary | ICD-10-CM

## 2015-10-01 DIAGNOSIS — R142 Eructation: Secondary | ICD-10-CM | POA: Diagnosis not present

## 2015-10-01 DIAGNOSIS — K219 Gastro-esophageal reflux disease without esophagitis: Secondary | ICD-10-CM | POA: Diagnosis not present

## 2015-10-01 MED ORDER — BACLOFEN 10 MG PO TABS
10.0000 mg | ORAL_TABLET | Freq: Three times a day (TID) | ORAL | Status: DC
Start: 2015-10-01 — End: 2016-01-30

## 2015-10-01 MED ORDER — RIFAXIMIN 550 MG PO TABS: 550.0000 mg | ORAL_TABLET | Freq: Three times a day (TID) | ORAL | Status: DC

## 2015-10-01 NOTE — Patient Instructions (Addendum)
You have been scheduled for a gastric emptying scan at [facility] on 10/09/2015  At 10:30am . Please arrive at least 15 minutes prior to your appointment for registration. Please make certain not to have anything to eat or drink after midnight the night before your test. Hold all stomach medications (ex: Zofran, phenergan, Reglan) 48 hours prior to your test. If you need to reschedule your appointment, please contact radiology scheduling at [phone]. _____________________________________________________________________ A gastric-emptying study measures how long it takes for food to move through your stomach. There are several ways to measure stomach emptying. In the most common test, you eat food that contains a small amount of radioactive material. A scanner that detects the movement of the radioactive material is placed over your abdomen to monitor the rate at which food leaves your stomach. This test normally takes about 2 hours to complete. _____________________________________________________________________  We have sent in baclofen and Xifaxian  ([facility]) to your pharmacy

## 2015-10-01 NOTE — Progress Notes (Signed)
HPI :  INTAKE VISIT: 65 y/o female previously followed by Dr. [name], new to me. She has a history of GERD and dysphagia, and IBS with significant abdominal bloating. She is here for follow up.   She endorses a history of heartburn. She is taking gaviscon PRN for this, but she thinks it makes her belch. She has been on a variety of PPIs - protonix, omeprazole, nexium, prevacid, and she has also been on zantac as well as other H2 blockers. She does not think any of these have stopped her symptoms in that they make her "belching" worse. She thinks they all cause her belching to be worse. She also thinks the heartburn is worse and she is nauseous on these regimens. She recently underwent a 24 HR pH impedance testing with a Demeester score of 37.1 with a strong positive symptom index.   She reports a history of dysphagia remotely but she has not had much problems with this recently. She had an EGD on 07/03/14 showing grade A esophagitis and had an empiric dilation done to which she had some benefit. She denies much problems with her swallowing at present time. 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   Otherwise in regards to her IBS, the patient had been given a course of rifaximin for 2 weeks for her gas and bloating. She reports she had a few months worth of benefit while on it. She has some alternating loose stools / constipation over time, with cramping in her abdomen. She has some improvement in her symptoms following a bowel movements. She reports she takes BC powder 2 times per day, on average, and takes aleve as well.   EGD 07/2014 - grade I esophagitis, dilation with SAvory dilator to 80m Colonoscopy - 2011 - normal 24 HR pH impedance testing - Demeester of 37.1 with positive symptom index Manometry - EGJ outflow obstruction, mildly elevated IRP  SINCE LAST VISIT:  She received rifaximin since our last visit and helped her IBS symptoms  significantly, particularly with her bloating. She reported significant benefit for her bloating and loose stools when she took it. Last received this a few months ago. Over time the response has waned and she is asking for another course.   She was given a trial of Aciphex 20mg  BID since our last visit, she thinks it made her symptoms worse of belching and she stopped it. 24 HR pH testing as above, she has pathologic acid reflux but no regimens have provided benefit. She reports worsening of symptoms with all PPIs and H2 blockers.  Her main complaint is belching at this time and regurgitation, which can be worse at night. She has rare dysphagia but doesn't bother her much. She does endorse some early satiety. No vomiting she endorses. She has some ongoing chronic nausea, she is not sure if this is related to eating. She takes zofran PRN for nausea and this helps. She reports she cannot lie down due to significant regurgitation. She occasionally vomits due to symptoms. She sleeps in a recliner to avoid nocturnal symptoms. She has ongoing bloating which can bother her as well.   She is concerned her fair is thin and falling out easily, this has been a progressive problem. She is seeing her primary care physician for this issue.  Past Medical History  Diagnosis Date  . Anxiety   . Depression   . GERD (gastroesophageal reflux disease)   . Hypothyroidism   .  Skin cancer of face   . Skin cancer of arm   . Allergy   . COPD (chronic obstructive pulmonary disease) ([place])   . Hyperlipidemia   . Hypertension   . Obesity   . DDD (degenerative disc disease), lumbar     mild  . Stroke ([place]) 07/2010    no permenant deficits  . Osteopenia   . Heart murmur   . Thrombocytopenia ([place])   . Female rectocele without uterine prolapse   . IBS (irritable bowel syndrome)   . Influenza A 06/2015     Past Surgical History  Procedure Laterality Date  . Tubal ligation  1975  . Appendectomy    . Foot surgery   03/31/2009    R foot  . Emgs  7/04    atms and wrists neg  . Cholecystectomy  2004  . Foot surgery  2008, 2009, 2010    right foot   . Esophageal manometry N/A 03/17/2015    Procedure: ESOPHAGEAL MANOMETRY (EM);  Surgeon: Manus Gunning, MD;  Location: [facility];  Service: Gastroenterology;  Laterality: N/A;   Family History  Problem Relation Age of Onset  . Skin cancer Father   . Hypertension Mother    Social History  Substance Use Topics  . Smoking status: Current Every Day Smoker -- 1.00 packs/day for 31 years    Types: Cigarettes  . Smokeless tobacco: Never Used     Comment: May try when husband finished treatment for prostate cancer  . Alcohol Use: No   Current Outpatient Prescriptions  Medication Sig Dispense Refill  . amLODipine (NORVASC) 10 MG tablet TAKE 1 TABLET (10 MG TOTAL) BY MOUTH DAILY. 30 tablet 5  . ARMOUR THYROID 90 MG tablet TAKE 1 TABLET (90 MG TOTAL) BY MOUTH DAILY. 90 tablet 3  . dipyridamole-aspirin (AGGRENOX) 200-25 MG 12hr capsule Take 1 capsule by mouth 2 (two) times daily. 180 capsule 1  . ezetimibe (ZETIA) 10 MG tablet Take 1 tablet (10 mg total) by mouth daily. 30 tablet 11  . ondansetron (ZOFRAN) 4 MG tablet Take 1 tablet (4 mg total) by mouth every 8 (eight) hours as needed for nausea. 60 tablet 1  . polyethylene glycol powder (GLYCOLAX/MIRALAX) powder MIX 1 CAPFUL (17 GRAMS) IN 8 OUNCES OF LIQUID AND DRINK DAILY AS NEEDED FOR CONSTIPATION. 527 g 2  . RABEprazole (ACIPHEX) 20 MG tablet Take 1 tablet (20 mg total) by mouth 2 (two) times daily. 120 tablet 3  . sertraline (ZOLOFT) 50 MG tablet Take 1 tablet (50 mg total) by mouth daily. 90 tablet 1   No current facility-administered medications for this visit.   Allergies  Allergen Reactions  . Lisinopril     Shortness of breath.  . Niacin     REACTION: dizziness  . Welchol [Colesevelam Hcl] Other (See Comments)    Diarrhea,Nausea, swelling in face  . Augmentin [Amoxicillin-Pot  Clavulanate]     Nauseated. GI upset with taking.  . Codeine     keeps patient awake  . Oxycodone Hcl     Patient states this medication makes her hyper     Review of Systems: All systems reviewed and negative except where noted in HPI.   Lab Results  Component Value Date   WBC 8.8 09/26/2015   HGB 14.1 09/26/2015   HCT 42.3 09/26/2015   MCV 85.6 09/26/2015   PLT 384.0 09/26/2015    Lab Results  Component Value Date   ALT 22 09/26/2015   AST 17 09/26/2015  ALKPHOS 98 09/26/2015   BILITOT 0.2 09/26/2015      Physical Exam: BP 130/72 mmHg  Pulse 100  Ht 4' 11.5" (1.511 m)  Wt 172 lb (78.019 kg)  BMI 34.17 kg/m2 Constitutional: Pleasant,well-developed, female in no acute distress. HEENT: Normocephalic and atraumatic. Conjunctivae are normal. No scleral icterus. Hair is thin Neck supple.  Cardiovascular: Normal rate, regular rhythm.  Pulmonary/chest: Effort normal and breath sounds normal. No wheezing, rales or rhonchi. Abdominal: Soft, nondistended, nontender. Bowel sounds active throughout. There are no masses palpable. No hepatomegaly. Extremities: no edema Lymphadenopathy: No cervical adenopathy noted. Neurological: Alert and oriented to person place and time. Skin: Skin is warm and dry. No rashes noted. Hair loss noted, brittle hair Psychiatric: Normal mood and affect. Behavior is normal.   ASSESSMENT AND PLAN: 65 y/o female with longstanding GERD, dysphagia, and IBS, here for follow up. Issues as outlined below.   GERD / belching - chronic symptoms with poor response to all PPIs to date, last attempt with aciphex not successful. We performed a 24 HR pH impedance study given her poor response to PPIs and H2blockers, and she had a high Demeester score and had a strong symptom index. Thus, she clearly has pathologic acid reflux, but unclear why she does not respond to PPIs historically. I discussed with her there is a difference between belching and heartburn, and  she may be interpreting belching as "worsening of reflux". I suspect she may likely have some supragastric belching and I discussed what this was with her. I offered her a trial of baclofen, particularly given her nocturnal symptoms to see if this helps. Recommend 10mg  qHS to start and titrate up to TID as tolerated. I hope this may help belching as well. Further, given her refractory symptoms and early satiety, will obtain a gastric emptying study to rule out gastroparesis. She agreed and will notify her of the results.   Dysphagia - Prior EGD reportedly normal but she had some benefit with empiric dilation. Manometry was performed showing suspected EGJ outflow obstruction but no evidence of achalasia. Her EGD showed no pathology at the GEJ or cardia to explain this. Given she has no dysphagia at present time, I would monitor for now, as treating outflow obstruction would likely make her reflux worse. If dysphagia recurs may consider a repeat EGD to ensure the LES and cardia is normal.   IBS - good response to rifaximin a few months ago but benefit has since wained. Will repeat another 2 weeks course. She has tested negative for celiac and prior colonoscopy unremarkable. She can follow up as needed for this issue. May consider a trial of VSL#3 if symptoms persist over time.  Hair loss - she will f/u with PCP regarding this, may consider dermatology evaluation.   Harmon Cellar, MD [facility] Pager [phone]

## 2015-10-06 ENCOUNTER — Telehealth: Payer: Self-pay | Admitting: Gastroenterology

## 2015-10-06 ENCOUNTER — Other Ambulatory Visit: Payer: Self-pay | Admitting: Family Medicine

## 2015-10-06 NOTE — Telephone Encounter (Signed)
Please reassure her that "stroke" is not a listed side effect of this drug (from review of the drug insert and reported side effects on [employer]). It can cause people to feel sedated, and can increase the risk of a seizure for those with a seizure history, and other temporary neurologic side effects, but I don't believe stroke is a listed side effect. Bentyl will not work for her symptoms which are refractory reflux and belching. I would recommend she try a lose dose at 10mg  qHS for a week or two and see if she tolerates it, and then she can increase to BID and TID as tolerated. Once taking multiple times per day, she should not stop it cold [place] if she needs to stop it, it needs to be slowly tapered. Thanks

## 2015-10-06 NOTE — Telephone Encounter (Signed)
Patient called c/o fluid retention in bilateral lower legs.  I explained that her refill was denied because she was taken off of the HCTZ.  She agrees and states it's because her sodium was low.  4/28 labs show decreased sodium as well.  Recommended ov for Dr. Diona Browner to evaluate but patient declines.  She states "I will take one of my husband's fluid pills and see Dr. Diona Browner on 5/26 (scheduled CPE/AWV)".

## 2015-10-06 NOTE — Telephone Encounter (Signed)
Pt states she went to pick up the script for baclofen and she is worried that it can cause stokes. Per her pharmacy the pt may want to try bentyl instead. Pt wants to know what Dr. [name] thinks. Please advise.

## 2015-10-06 NOTE — Telephone Encounter (Signed)
PLEASE NOTE: All timestamps contained within this report are represented as [place] Standard Time. CONFIDENTIALTY NOTICE: This fax transmission is intended only for the addressee. It contains information that is legally privileged, confidential or otherwise protected from use or disclosure. If you are not the intended recipient, you are strictly prohibited from reviewing, disclosing, copying using or disseminating any of this information or taking any action in reliance on or regarding this information. If you have received this fax in error, please notify us immediately by telephone so that we can arrange for its return to [place]. Phone: [phone], Toll-Free: [phone], Fax: [phone] Page: 1 of 2 Call Id: [id number] [place] Patient Name: Tiffany Orozco Gender: Female DOB: 05-21-1951 Age: 65 Y 11 M 25 D Return Phone Number: [phone] (Primary) Address: City/State/Zip: [place] Client [place] Night - Client Client Site [place] Physician [name] - MD Contact Type Call Who Is Calling Patient / Member / Family / Caregiver Call Type Triage / Clinical Relationship To Patient Self Return Phone Number [phone] (Primary) Chief Complaint BREATHING - shortness of breath or sounds breathless Reason for Call Symptomatic / Request for [place] states she recently had a stroke and has congested heart failure and is swelling bad in her chest area and she has shortness of breath and she needs her prescription refill. PreDisposition Call Doctor Translation No Nurse Assessment Nurse: Mancel Bale, RN, [place] Date/Time Eilene Ghazi Time): 10/06/2015 3:57:50 PM Confirm and document reason for call. If symptomatic, describe symptoms. You must click the next button to save text entered. ---Caller states that she is  having difficulty breathing like she has fluid building up. Caller states that she was really sick for a long time, and hasn't taken the diuretic since January. Caller states that she is having vomiting and diarrhea- but she is seeing the doctor for these symptoms and they are getting better. Caller states that the difficulty breathing has been getting worse over the last week. Caller states that she is having swelling in her feet and stomach. Has the patient traveled out of the country within the last 30 days? ---No Does the patient have any new or worsening symptoms? ---Yes Will a triage be completed? ---Yes Related visit to physician within the last 2 weeks? ---Yes Does the PT have any chronic conditions? (i.e. diabetes, asthma, etc.) ---Yes List chronic conditions. ---CHF, Stroke, Thyroid Issues, High blood Pressure, High Cholesterol. Is this a behavioral health or substance abuse call? ---No Guidelines Guideline Title Affirmed Question Affirmed Notes Nurse Date/Time Eilene Ghazi Time) Breathing Difficulty [1] MODERATE difficulty breathing Mancel Bale, RN, Cayla 10/06/2015 4:06:58 PM PLEASE NOTE: All timestamps contained within this report are represented as [place] Standard Time. CONFIDENTIALTY NOTICE: This fax transmission is intended only for the addressee. It contains information that is legally privileged, confidential or otherwise protected from use or disclosure. If you are not the intended recipient, you are strictly prohibited from reviewing, disclosing, copying using or disseminating any of this information or taking any action in reliance on or regarding this information. If you have received this fax in error, please notify us immediately by telephone so that we can arrange for its return to [place]. Phone: [phone], Toll-Free: [phone], Fax: [phone] Page: 2 of 2 Call Id: [id number] Guidelines Guideline Title Affirmed Question Affirmed Notes Nurse Date/Time  Eilene Ghazi Time) (e.g., speaks in phrases, SOB even at rest, pulse 100-120)  AND [2] NEWonset or WORSE than normal Disp. Time Eilene Ghazi Time) Disposition Final User 10/06/2015 3:55:12 PM Send to Urgent Queue Honor Loh 10/06/2015 4:26:03 PM Go to ED Now Yes Mancel Bale, RN, Cayla Caller Understands: Yes Disagree/Comply: Disagree Disagree/Comply Reason: Disagree with instructions Care Advice Given Per Guideline GO TO ED NOW: You need to be seen in the Emergency Department. Go to the ER at ___________ [place] now. Drive carefully. Comments User: Owens Shark, RN Date/Time Eilene Ghazi Time): 10/06/2015 4:27:45 PM Caller states that she was on the phone with a nurse in the office, and she was transferred to the after hours line. The nurse triaged and gave the recommendation, but the caller declined and insisted that she just needed her diurectic refilled. Referrals GO TO FACILITY REFUSED GO TO FACILITY UNDECIDED GO TO FACILITY REFUSED

## 2015-10-07 NOTE — Telephone Encounter (Signed)
Patient given recommendations. 

## 2015-10-07 NOTE — Telephone Encounter (Signed)
Left a message for patient to call back. 

## 2015-10-09 ENCOUNTER — Encounter (HOSPITAL_COMMUNITY)
Admission: RE | Admit: 2015-10-09 | Discharge: 2015-10-09 | Disposition: A | Discharge status: Home / Self Care | Payer: Medicare Other | Source: Ambulatory Visit | Attending: Gastroenterology | Admitting: Gastroenterology

## 2015-10-09 DIAGNOSIS — R142 Eructation: Secondary | ICD-10-CM | POA: Insufficient documentation

## 2015-10-09 DIAGNOSIS — K589 Irritable bowel syndrome without diarrhea: Secondary | ICD-10-CM | POA: Diagnosis not present

## 2015-10-09 DIAGNOSIS — K219 Gastro-esophageal reflux disease without esophagitis: Secondary | ICD-10-CM | POA: Diagnosis not present

## 2015-10-09 MED ORDER — TECHNETIUM TC 99M SULFUR COLLOID: 2.0600 | Freq: Once | INTRAVENOUS | Status: DC | PRN

## 2015-10-09 MED ORDER — TECHNETIUM TC 99M SULFUR COLLOID
2.0600 | Freq: Once | INTRAVENOUS | Status: AC | PRN
  Administered 2015-10-09: 2.06 via ORAL

## 2015-10-10 ENCOUNTER — Telehealth: Payer: Self-pay | Admitting: Gastroenterology

## 2015-10-10 NOTE — Telephone Encounter (Signed)
[facility] will contact the patient before shipment

## 2015-10-15 ENCOUNTER — Other Ambulatory Visit: Payer: Self-pay | Admitting: Family Medicine

## 2015-10-20 ENCOUNTER — Telehealth: Payer: Self-pay | Admitting: Family Medicine

## 2015-10-20 ENCOUNTER — Other Ambulatory Visit (INDEPENDENT_AMBULATORY_CARE_PROVIDER_SITE_OTHER): Payer: Medicare Other

## 2015-10-20 DIAGNOSIS — M8580 Other specified disorders of bone density and structure, unspecified site: Secondary | ICD-10-CM

## 2015-10-20 DIAGNOSIS — E7800 Pure hypercholesterolemia, unspecified: Secondary | ICD-10-CM

## 2015-10-20 LAB — COMPREHENSIVE METABOLIC PANEL
ALT: 17 U/L (ref 0–35)
AST: 14 U/L (ref 0–37)
Albumin: 4.1 g/dL (ref 3.5–5.2)
Alkaline Phosphatase: 97 U/L (ref 39–117)
BUN: 14 mg/dL (ref 6–23)
CO2: 26 mEq/L (ref 19–32)
Calcium: 9 mg/dL (ref 8.4–10.5)
Chloride: 103 mEq/L (ref 96–112)
Creatinine, Ser: 0.68 mg/dL (ref 0.40–1.20)
GFR: 91.72 mL/min (ref >60.00)
Glucose, Bld: 104 mg/dL — ABNORMAL HIGH (ref 70–99)
Potassium: 4.3 mEq/L (ref 3.5–5.1)
Sodium: 135 mEq/L (ref 135–145)
Total Bilirubin: 0.3 mg/dL (ref 0.2–1.2)
Total Protein: 6.5 g/dL (ref 6.0–8.3)

## 2015-10-20 LAB — LIPID PANEL
Cholesterol: 151 mg/dL (ref 0–200)
HDL: 33.8 mg/dL — ABNORMAL LOW (ref >39.00)
LDL Cholesterol: 86 mg/dL (ref 0–99)
NonHDL: 117.1
Total CHOL/HDL Ratio: 4
Triglycerides: 156 mg/dL — ABNORMAL HIGH (ref 0.0–149.0)
VLDL: 31.2 mg/dL (ref 0.0–40.0)

## 2015-10-20 LAB — VITAMIN D 25 HYDROXY (VIT D DEFICIENCY, FRACTURES): VITD: 22.2 ng/mL — ABNORMAL LOW (ref 30.00–100.00)

## 2015-10-20 NOTE — Telephone Encounter (Signed)
-----   Message from Marchia Bond sent at 10/10/2015 11:25 AM EDT ----- Regarding: Cpx labs Mon 5/22, need orders. Thanks! :-) Please order  future cpx labs for pt's upcoming lab appt. Thanks Aniceto Boss

## 2015-10-24 ENCOUNTER — Ambulatory Visit (INDEPENDENT_AMBULATORY_CARE_PROVIDER_SITE_OTHER): Payer: Medicare Other | Admitting: Family Medicine

## 2015-10-24 ENCOUNTER — Encounter: Payer: Self-pay | Admitting: Family Medicine

## 2015-10-24 VITALS — BP 128/68 | HR 91 | Temp 97.6°F | Ht 59.25 in | Wt 170.0 lb

## 2015-10-24 DIAGNOSIS — I10 Essential (primary) hypertension: Secondary | ICD-10-CM

## 2015-10-24 DIAGNOSIS — J449 Chronic obstructive pulmonary disease, unspecified: Secondary | ICD-10-CM

## 2015-10-24 DIAGNOSIS — Z0000 Encounter for general adult medical examination without abnormal findings: Secondary | ICD-10-CM | POA: Diagnosis not present

## 2015-10-24 DIAGNOSIS — R5382 Chronic fatigue, unspecified: Secondary | ICD-10-CM

## 2015-10-24 DIAGNOSIS — R110 Nausea: Secondary | ICD-10-CM | POA: Diagnosis not present

## 2015-10-24 DIAGNOSIS — E039 Hypothyroidism, unspecified: Secondary | ICD-10-CM | POA: Diagnosis not present

## 2015-10-24 DIAGNOSIS — F321 Major depressive disorder, single episode, moderate: Secondary | ICD-10-CM

## 2015-10-24 MED ORDER — HYDROCHLOROTHIAZIDE 25 MG PO TABS: 25.0000 mg | ORAL_TABLET | Freq: Every day | ORAL | Status: DC | PRN

## 2015-10-24 NOTE — Progress Notes (Signed)
I have personally reviewed the Medicare Annual Wellness questionnaire and have noted 1. The patient's medical and social history 2. Their use of alcohol, tobacco or illicit drugs 3. Their current medications and supplements 4. The patient's functional ability including ADL's, fall risks, home safety risks and hearing or visual             impairment. 5. Diet and physical activities 6. Evidence for depression or mood disorders 7.         Updated provider list Cognitive evaluation was performed and recorded on pt medicare questionnaire form. The patients weight, height, BMI and visual acuity have been recorded in the chart  I have made referrals, counseling and provided education to the patient based review of the above and I have provided the pt with a written personalized care plan for preventive services.     Depression, well controlled..  On sertraline  50 mg daily  She remains feeling weak and tired, falls asleep easily during the day, but problem staying asleep at night.  PHQ2 5/30  Chronic nausea, resolved, less belchng: Followed by  GI, better with protonix and  Xifaxan.  Hypertension:  Well controlled on amlodipine, but has not been on HCTZ given she was dehydrated. BP Readings from Last 3 Encounters:  10/24/15 128/68  10/01/15 130/72  09/26/15 122/76  Having swelling in legs off and on. Wants to restart HCTZ.  Hypothyroid: Well controlled on armour thyroid Lab Results  Component Value Date   TSH 0.93 09/26/2015   Elevated Cholesterol: HDL low, but LDL almost  at goal <70 Off of simvastatin (SE) and on zetia. HX of CVA on aggrenox. Lab Results  Component Value Date   CHOL 151 10/20/2015   HDL 33.80* 10/20/2015   LDLCALC 86 10/20/2015   LDLDIRECT 135.0 01/15/2015   TRIG 156.0* 10/20/2015   CHOLHDL 4 10/20/2015  Using medications without problems:None  Muscle aches: None   Diet compliance:  improved  Wt Readings from Last 3 Encounters:  10/24/15 170 lb  (77.111 kg)  10/01/15 172 lb (78.019 kg)  09/26/15 170 lb 6.4 oz (77.293 kg)  Exercise: walking 30 min a day  Social History /Family History/Past Medical History reviewed and updated if needed.  Review of Systems  Constitutional: Positive for fatigue. Negative for fever and unexpected weight change.  HENT: Negative for congestion, ear pain, sinus pressure, sneezing, sore throat and trouble swallowing.  Eyes: Negative for pain and itching.  Respiratory: Negative for cough, shortness of breath and wheezing.  Cardiovascular: Negative for chest pain, palpitations and leg swelling.  Gastrointestinal: Negative for nausea, abdominal pain, diarrhea, constipation and blood in stool.  Genitourinary: Negative for dysuria, hematuria, vaginal discharge, difficulty urinating and menstrual problem.  Skin: Negative for rash.  Neurological: Negative for syncope, weakness, light-headedness, numbness and headaches.  Psychiatric/Behavioral: Negative for confusion and dysphoric mood. The patient is not nervous/anxious.      Objective:  Physical Exam  Constitutional: Vital signs are normal. She appears well-developed and well-nourished. She is cooperative. Non-toxic appearance. She does not appear ill. No distress.  HENT:  Head: Normocephalic.  Right Ear: Hearing, tympanic membrane, external ear and ear canal normal.  Left Ear: Hearing, tympanic membrane, external ear and ear canal normal.  Nose: Nose normal.  Eyes: Conjunctivae, EOM and lids are normal. Pupils are equal, round, and reactive to light. Lids are everted and swept, no foreign bodies found.  Neck: Trachea normal and normal range of motion. Neck supple. Carotid bruit is not present. No  mass and no thyromegaly present.  Cardiovascular: Normal rate, regular rhythm, S1 normal, S2 normal, normal heart sounds and intact distal pulses. Exam reveals no gallop.  No murmur heard. Pulmonary/Chest: Effort normal and breath  sounds normal. No respiratory distress. She has no wheezes. She has no rhonchi. She has no rales.  Abdominal: Soft. Normal appearance and bowel sounds are normal. She exhibits no distension, no fluid wave, no abdominal bruit and no mass. There is no hepatosplenomegaly. There is no tenderness. There is no rebound, no guarding and no CVA tenderness. No hernia.  Genitourinary: per GYN  Lymphadenopathy:   She has no cervical adenopathy.   She has no axillary adenopathy.  Neurological: She is alert. She has normal strength. No cranial nerve deficit or sensory deficit.  Skin: Skin is warm, dry and intact. No rash noted.  Psychiatric: Her speech is normal and behavior is normal. Judgment normal. Her mood appears not anxious. Cognition and memory are normal. She does not exhibit a depressed mood.         Assessment & Plan:  The patient's preventative maintenance and recommended screening tests for an annual wellness exam were reviewed in full today. Brought up to date unless services declined.  Counselled on the importance of diet, exercise, and its role in overall health and mortality. The patient's FH and SH was reviewed, including their home life, tobacco status, and drug and alcohol status.   Vaccines: Uptodate  PAP/DVE:nml in 2013, pap no longer indicated. Plans to see GYN  For prolapsed uterus. Mammo: nml 07/2015 per GYN DXA: osteopenia 06/2011.. no significant change plan repeat in 2018. Colon: 04/2010, Dr. [name], nml, repeat in 10 years  Smoker:precontemplative 31 pack year history, refer to lung cancer screening program has repeat schedule 5/30. nml EKG in 07/2013.

## 2015-10-24 NOTE — Progress Notes (Signed)
Pre visit review using our clinic review tool, if applicable. No additional management support is needed unless otherwise documented below in the visit note. 

## 2015-10-24 NOTE — Patient Instructions (Addendum)
Work on low cholesterol diet and healthy eating. Continue regular exercise.  Can start B12 vitamin 1000 mcg daily.  Restart  HCTZ for swelling in legs as needed.  If energy remains low.. Follow up sooner.  See Derm for hair loss.

## 2015-10-28 ENCOUNTER — Ambulatory Visit (INDEPENDENT_AMBULATORY_CARE_PROVIDER_SITE_OTHER)
Admission: RE | Admit: 2015-10-28 | Discharge: 2015-10-28 | Disposition: A | Discharge status: Home / Self Care | Payer: Medicare Other | Source: Ambulatory Visit | Attending: Acute Care | Admitting: Acute Care

## 2015-10-28 DIAGNOSIS — F17210 Nicotine dependence, cigarettes, uncomplicated: Secondary | ICD-10-CM

## 2015-10-28 DIAGNOSIS — Z87891 Personal history of nicotine dependence: Secondary | ICD-10-CM | POA: Diagnosis not present

## 2015-11-11 ENCOUNTER — Telehealth: Payer: Self-pay

## 2015-11-11 NOTE — Telephone Encounter (Signed)
PLEASE NOTE: All timestamps contained within this report are represented as [place] Standard Time. CONFIDENTIALTY NOTICE: This fax transmission is intended only for the addressee. It contains information that is legally privileged, confidential or otherwise protected from use or disclosure. If you are not the intended recipient, you are strictly prohibited from reviewing, disclosing, copying using or disseminating any of this information or taking any action in reliance on or regarding this information. If you have received this fax in error, please notify us immediately by telephone so that we can arrange for its return to [place]. Phone: [phone], Toll-Free: [phone], Fax: [phone] Page: 1 of 2 Call Id: [id number] Lindsay Patient Name: Tiffany Orozco Gender: Female DOB: Mar 06, 1951 Age: 25 Y 30 D Return Phone Number: [phone] (Primary) Address: City/State/Zip: [place] Client [place] [facility] - Client Client Site [place] Physician [name] - MD Contact Type Call Who Is Calling Patient / Member / Family / Caregiver Call Type Triage / Clinical Relationship To Patient Self Return Phone Number [phone] (Primary) Chief Complaint Tick Bite Reason for Call Symptomatic / Request for [place] states she was bitten by a tick on her buttocks and now has blisters. PreDisposition Did not know what to do Translation No Nurse Assessment Nurse: Caprice Beaver, RN, Darla Date/Time (Eastern Time): 11/10/2015 5:42:48 PM Confirm and document reason for call. If symptomatic, describe symptoms. You must click the next button to save text entered. ---Caller states she was bitten by a tick on the buttock and has blisters on the area. Has the patient traveled out of the country within the last 30 days? ---Not  Applicable Does the patient have any new or worsening symptoms? ---Yes Will a triage be completed? ---Yes Related visit to physician within the last 2 weeks? ---No Does the PT have any chronic conditions? (i.e. diabetes, asthma, etc.) ---Yes List chronic conditions. ---Hypothyroid, Hyponatremia, Is this a behavioral health or substance abuse call? ---No Guidelines Guideline Title Affirmed Question Affirmed Notes Nurse Date/Time (Eastern Time) Tick Bite [1] 2 to 14 days following tick bite AND [2] fever AND [3] no rash or headache Caprice Beaver, RN, Darla 11/10/2015 5:44:38 PM Disp. Time Eilene Ghazi Time) Disposition Final User 11/10/2015 5:26:32 PM Send To RN Personal Dorene Sorrow 11/10/2015 5:49:55 PM See Physician within 24 Hours Yes Caprice Beaver, RN, Darla PLEASE NOTE: All timestamps contained within this report are represented as [place] Standard Time. CONFIDENTIALTY NOTICE: This fax transmission is intended only for the addressee. It contains information that is legally privileged, confidential or otherwise protected from use or disclosure. If you are not the intended recipient, you are strictly prohibited from reviewing, disclosing, copying using or disseminating any of this information or taking any action in reliance on or regarding this information. If you have received this fax in error, please notify us immediately by telephone so that we can arrange for its return to [place]. Phone: [phone], Toll-Free: [phone], Fax: [phone] Page: 2 of 2 Call Id: [id number] Caller Understands: Yes Disagree/Comply: Comply Care Advice Given Per Guideline SEE PHYSICIAN WITHIN 24 HOURS: * IF OFFICE WILL BE OPEN: You need to be seen within the next 24 hours. Call your doctor when the office opens, and make an appointment. CALL BACK IF: * You become worse. CARE ADVICE given per Tick Bites (Adult) guideline. Comments User: Verneda Skill, RN Date/Time Eilene Ghazi Time): 11/10/2015 5:52:09 PM MRSA Skin  Infection:  Signs & Symptoms. MRSA infections can appear as a small red bump, pimple, or boil. The area may be tender, swollen, or warm to the touch [email or web address]...[email or web address]-[email or web address] Caller having questions of MRSA on the skin. Above information given to caller. Referrals REFERRED TO PCP OFFICE

## 2015-11-11 NOTE — Telephone Encounter (Signed)
Pt has appt on 11/12/15 at 10:15 with Dr Lorelei Pont.

## 2015-11-12 ENCOUNTER — Ambulatory Visit: Payer: Medicare Other | Admitting: Family Medicine

## 2015-11-25 DIAGNOSIS — R5383 Other fatigue: Secondary | ICD-10-CM | POA: Insufficient documentation

## 2015-11-25 NOTE — Assessment & Plan Note (Signed)
Continue regular exercise.  Can start B12 vitamin 1000 mcg daily.   If energy remains low.. Follow up sooner.

## 2015-11-25 NOTE — Assessment & Plan Note (Signed)
Improved with PPI as well as SSRI.

## 2015-11-25 NOTE — Assessment & Plan Note (Signed)
Well controlled but will restart HCTZ in addition to the amlodipine given swelling in ankles.

## 2015-11-25 NOTE — Assessment & Plan Note (Signed)
Moderate control. Counseled to quit smoking.

## 2015-11-25 NOTE — Assessment & Plan Note (Signed)
Stable control on current sertraline dose.

## 2015-11-25 NOTE — Assessment & Plan Note (Signed)
Well controlled. Continue current medication.  

## 2015-12-15 ENCOUNTER — Telehealth: Payer: Self-pay | Admitting: Family Medicine

## 2015-12-15 ENCOUNTER — Other Ambulatory Visit: Payer: Self-pay | Admitting: Family Medicine

## 2015-12-15 NOTE — Telephone Encounter (Signed)
Pt called and would like to know why she was charged $70.00 for a lab visit.   Pt request call back. I informed pt this could take 2-3 buisness days as you will have to speak with billing dept 1st.  Call back # [phone]

## 2015-12-16 ENCOUNTER — Telehealth: Payer: Self-pay | Admitting: Family Medicine

## 2015-12-16 ENCOUNTER — Telehealth: Payer: Self-pay | Admitting: *Deleted

## 2015-12-16 NOTE — Telephone Encounter (Signed)
Vit D 2,000 units a day would be typical dosage

## 2015-12-16 NOTE — Telephone Encounter (Signed)
Tiffany Orozco notified to take Vit D3 2000 units daily per Dr. Lorelei Pont.

## 2015-12-16 NOTE — Telephone Encounter (Signed)
Called pt and let her know I added dx M85.9 as primary and requested a re-file to her ins.

## 2015-12-16 NOTE — Telephone Encounter (Signed)
Pt saw her lab results and wants to know if she should increase her vit D since it is low.  She request c/b [phone]  Thank you!

## 2015-12-16 NOTE — Telephone Encounter (Signed)
Spoke with Ms. Tiffany Orozco.  She states she hadn't taken any Vitamin D for a while but when she saw her results yesterday she started taking Vitamin D 400 mg twice a day.  Please advise.

## 2015-12-16 NOTE — Telephone Encounter (Signed)
Received fax from [place] requesting PA for Armour Thyroid 90 mg.  PA completed on CoverMyMeds.  Awaiting decision.

## 2015-12-16 NOTE — Telephone Encounter (Signed)
Possibly.. How much is she taking?

## 2015-12-18 NOTE — Telephone Encounter (Addendum)
PA approved until further notice per [facility].  [facility] [place] notified by fax.

## 2016-01-15 ENCOUNTER — Other Ambulatory Visit: Payer: Self-pay | Admitting: Family Medicine

## 2016-01-16 ENCOUNTER — Telehealth: Payer: Self-pay

## 2016-01-16 ENCOUNTER — Telehealth: Payer: Self-pay | Admitting: Family Medicine

## 2016-01-16 NOTE — Telephone Encounter (Signed)
Let pt know that an appt in next few weeks to discuss issue would be best. Pancreas was check in 2016 nml when she was having the same issues. I don't see clear reason to recheck.  I am okay with her coming in for labs prior to [place] for A1C.    I do not recommend she stop her Aggrenox and BP meds given past stroke. Other medications  (other that armour thyroid) okay to stop to see how she feels.

## 2016-01-16 NOTE — Telephone Encounter (Signed)
Pt states she's been sick for 3 years and nobody seems to know what's making her sic. She said she's had diarrhea for three weeks now. She wants you to know that she stopped all of her meds except for her armour thyroid. She would like her"pancreatic levels to be check and an A1C.

## 2016-01-19 NOTE — Telephone Encounter (Signed)
Tiffany Orozco notified as instructed by telephone.  She states she is taking her Aggrenox and her thyroid medicine.  She states today she is feeling a little better.  She states she is not vomiting and today she does not have a headache.  She is wondering if her symptoms could be contributed to her cholesterol medication or her stomach medication. I advised to continue taking her Aggrenox and Amour Thyroid as well as restart her Amlodipine per Dr. Diona Browner.  All other medications she can hold until she follows up with Dr. Diona Browner.  Lab appointment scheduled 01/30/2016 at 9:05 am and 02/05/2016 at 9:00 am with Dr. Diona Browner.  Tiffany Orozco is agreeable to plan.

## 2016-01-28 ENCOUNTER — Encounter: Payer: Self-pay | Admitting: Family Medicine

## 2016-01-28 ENCOUNTER — Telehealth: Payer: Self-pay | Admitting: Gastroenterology

## 2016-01-28 DIAGNOSIS — D485 Neoplasm of uncertain behavior of skin: Secondary | ICD-10-CM | POA: Diagnosis not present

## 2016-01-28 DIAGNOSIS — L820 Inflamed seborrheic keratosis: Secondary | ICD-10-CM | POA: Diagnosis not present

## 2016-01-28 DIAGNOSIS — L650 Telogen effluvium: Secondary | ICD-10-CM | POA: Diagnosis not present

## 2016-01-28 DIAGNOSIS — L570 Actinic keratosis: Secondary | ICD-10-CM | POA: Diagnosis not present

## 2016-01-28 NOTE — Telephone Encounter (Signed)
Patient states that she would like to transfer to another GI provider. She states that Dr. [name] "has not done anything for her nausea, vomiting, diarrhea, and acid reflux". She states that she does not know any of our other doctors. Dr. Henrene Pastor is Doc of the Day. Is transfer ok? Thanks.

## 2016-01-28 NOTE — Telephone Encounter (Signed)
Dr. [name] do you approve of transfer?

## 2016-01-28 NOTE — Telephone Encounter (Signed)
This is the first I have heard from her since I last saw her in May, at which time she reported improvement in IBS symptoms on rifaximin and we repeated another course. She has had an extensive evaluation for her symptoms to date to and has refractory / persistent reflux.   I would like the opportunity to see her in clinic to reassess her symptoms and discuss management. If she declines to see me again this you can see if someone else is willing to see her. Thanks

## 2016-01-28 NOTE — Telephone Encounter (Signed)
Patient is willing to see you again.  She will come in on Friday at 3:45

## 2016-01-30 ENCOUNTER — Other Ambulatory Visit (INDEPENDENT_AMBULATORY_CARE_PROVIDER_SITE_OTHER): Payer: Medicare Other

## 2016-01-30 ENCOUNTER — Ambulatory Visit (INDEPENDENT_AMBULATORY_CARE_PROVIDER_SITE_OTHER): Payer: Medicare Other | Admitting: Gastroenterology

## 2016-01-30 ENCOUNTER — Telehealth: Payer: Self-pay | Admitting: *Deleted

## 2016-01-30 ENCOUNTER — Encounter: Payer: Self-pay | Admitting: Gastroenterology

## 2016-01-30 VITALS — BP 144/68 | HR 88 | Ht <= 58 in | Wt 175.0 lb

## 2016-01-30 DIAGNOSIS — K589 Irritable bowel syndrome without diarrhea: Secondary | ICD-10-CM | POA: Diagnosis not present

## 2016-01-30 DIAGNOSIS — R7309 Other abnormal glucose: Secondary | ICD-10-CM

## 2016-01-30 DIAGNOSIS — R1310 Dysphagia, unspecified: Secondary | ICD-10-CM | POA: Diagnosis not present

## 2016-01-30 DIAGNOSIS — R140 Abdominal distension (gaseous): Secondary | ICD-10-CM

## 2016-01-30 DIAGNOSIS — K219 Gastro-esophageal reflux disease without esophagitis: Secondary | ICD-10-CM

## 2016-01-30 LAB — HEMOGLOBIN A1C: Hgb A1c MFr Bld: 6.3 % (ref 4.6–6.5)

## 2016-01-30 MED ORDER — COLESTIPOL HCL 1 G PO TABS: 1.0000 g | ORAL_TABLET | Freq: Two times a day (BID) | ORAL | 1 refills | Status: DC

## 2016-01-30 MED ORDER — DEXLANSOPRAZOLE 60 MG PO CPDR: 60.0000 mg | DELAYED_RELEASE_CAPSULE | Freq: Every day | ORAL | 1 refills | Status: DC

## 2016-01-30 MED ORDER — ONDANSETRON 8 MG PO TBDP: 8.0000 mg | ORAL_TABLET | Freq: Three times a day (TID) | ORAL | 3 refills | Status: DC | PRN

## 2016-01-30 MED ORDER — SUCRALFATE 1 GM/10ML PO SUSP: 1.0000 g | Freq: Two times a day (BID) | ORAL | 1 refills | Status: DC

## 2016-01-30 NOTE — Patient Instructions (Signed)
You have been scheduled for an endoscopy. Please follow written instructions given to you at your visit today. If you use inhalers (even only as needed), please bring them with you on the day of your procedure. Your physician has requested that you go to www.startemmi.com and enter the access code given to you at your visit today. This web site gives a general overview about your procedure. However, you should still follow specific instructions given to you by our office regarding your preparation for the procedure.   We have sent the following medications to your pharmacy for you to pick up at your convenience:Dexilant,Carafate,Zofran,Colestid    You will be contacted by our office prior to your procedure for directions on holding your Aggrenox .  If you do not hear from our office 1 week prior to your scheduled procedure, please call [phone] to discuss.  You will take a 81 mg asprin while off aggrenox

## 2016-01-30 NOTE — Telephone Encounter (Signed)
  01/30/2016   RE: JOHNSON TERRANA DOB: Feb 24, 1951 MRN: [id number]   Dear Dr Diona Browner,   We have scheduled the above patient for an endoscopic procedure. Our records show that she is on anticoagulation therapy.   Please advise as to how long the patient may come off her therapy of Aggrenox prior to the procedure, which is scheduled for 03/01/2016.  Please fax back/ or route the completed form to [place] at [phone].   Sincerely,    Genella Mech

## 2016-01-30 NOTE — Progress Notes (Signed)
HPI :  65 y/o female here for a follow up visit. She has a longstanding history of poorly controlled GERD, belching, and chronic loose stools / bloating attributed to IBS-D. She has had prior EGD, pH testing, manometry, colonoscopy, gastric emptying study, and barium swallow She has been on a variety of PPIs - protonix, omeprazole, nexium, prevacid, and aciphex, and she has also been on zantac as well as other H2 blockers. She does not think any of these have stopped her symptoms of pyrosis but that they make her "belching" worse. She has not been on Dexilant. She had a trial of baclofen since the past visit and it "made belching worse" and she stopped it.  She has since stopped PPI since her last visit and symptoms have persisted and worsening. She has problems with periodic vomiting. By this she means nocturnal reflux with regurgitation and vomiting. GERD is generally much worse when she lies down. She eats dinner around 5-6 PM and goes to bed around 11 or 12. She sleeps in a recliner and can't sleep in a bed due to symptoms. She otherwise has persistent belching - husband and wife report ongoing belching, nonstop during the day, stops at night when she sleep.  She has a lot of pyrosis.   At the last visit she reported dysphagia was not bothering her, but has since recurred. She is having dysphagia in the sternal notch area with solids. She otherwise has ongoing abdominal bloating and loose stools.Sshe is having multiple loose stools in the morning, and then not much the rest of the day. She took another course of rifaximin which she thinks provided benefit for several weeks but then it wore off. She is not taking anything for her diarrhea or reflux at the time, only her aggrenox and thyroid medication.    Prior workup: EGD 07/2014 - grade I esophagitis, dilation with SAvory dilator to 74m Colonoscopy - 2011 - normal 24 HR pH impedance testing - [place] of 37.1 with positive symptom  index Manometry - EGJ outflow obstruction, mildly elevated IRP Gastric emptying study normal Barium study 01/2015 - some dysmotility noted, ? Stricture near GEJ  Past Medical History:  Diagnosis Date  . Allergy   . Anxiety   . COPD (chronic obstructive pulmonary disease) ([place])   . DDD (degenerative disc disease), lumbar    mild  . Depression   . Female rectocele without uterine prolapse   . GERD (gastroesophageal reflux disease)   . Heart murmur   . Hyperlipidemia   . Hypertension   . Hypothyroidism   . IBS (irritable bowel syndrome)   . Influenza A 06/2015  . Obesity   . Osteopenia   . Skin cancer of arm   . Skin cancer of face   . Stroke ([place]) 07/2010   no permenant deficits  . Thrombocytopenia ([place])      Past Surgical History:  Procedure Laterality Date  . APPENDECTOMY    . CHOLECYSTECTOMY  2004  . EMGs  7/04   atms and wrists neg  . ESOPHAGEAL MANOMETRY N/A 03/17/2015   Procedure: ESOPHAGEAL MANOMETRY (EM);  Surgeon: Manus Gunning, MD;  Location: [facility];  Service: Gastroenterology;  Laterality: N/A;  . FOOT SURGERY  03/31/2009   R foot  . FOOT SURGERY  2008, 2009, 2010   right foot   . TUBAL LIGATION  1975   Family History  Problem Relation Age of Onset  . Skin cancer Father   . Hypertension Mother  Social History  Substance Use Topics  . Smoking status: Current Every Day Smoker    Packs/day: 1.00    Years: 31.00    Types: Cigarettes  . Smokeless tobacco: Never Used     Comment: May try when husband finished treatment for prostate cancer  . Alcohol use No   Current Outpatient Prescriptions  Medication Sig Dispense Refill  . ARMOUR THYROID 90 MG tablet TAKE 1 TABLET (90 MG TOTAL) BY MOUTH DAILY. 90 tablet 3  . dipyridamole-aspirin (AGGRENOX) 200-25 MG 12hr capsule TAKE 1 CAPSULE BY MOUTH 2 (TWO) TIMES DAILY. 180 capsule 1   No current facility-administered medications for this visit.    Allergies  Allergen Reactions  . Lisinopril      Shortness of breath.  . Niacin     REACTION: dizziness  . Welchol [Colesevelam Hcl] Other (See Comments)    Diarrhea,Nausea, swelling in face  . Augmentin [Amoxicillin-Pot Clavulanate]     Nauseated. GI upset with taking.  . Codeine     keeps patient awake  . Oxycodone Hcl     Patient states this medication makes her hyper     Review of Systems: All systems reviewed and negative except where noted in HPI.   Lab Results  Component Value Date   WBC 8.8 09/26/2015   HGB 14.1 09/26/2015   HCT 42.3 09/26/2015   MCV 85.6 09/26/2015   PLT 384.0 09/26/2015    Lab Results  Component Value Date   CREATININE 0.68 10/20/2015   BUN 14 10/20/2015   NA 135 10/20/2015   K 4.3 10/20/2015   CL 103 10/20/2015   CO2 26 10/20/2015    Lab Results  Component Value Date   ALT 17 10/20/2015   AST 14 10/20/2015   ALKPHOS 97 10/20/2015   BILITOT 0.3 10/20/2015     Physical Exam: BP (!) 144/68   Pulse 88   Ht 4\' 9"  (1.448 m)   Wt 175 lb (79.4 kg)   BMI 37.87 kg/m  Constitutional: Pleasant,well-developed, female in no acute distress. HEENT: Normocephalic and atraumatic. Conjunctivae are normal. No scleral icterus. Neck supple.  Cardiovascular: Normal rate, regular rhythm.  Pulmonary/chest: Effort normal and breath sounds normal. No wheezing, rales or rhonchi. Abdominal: Soft, nondistended, nontender. Bowel sounds active throughout. There are no masses palpable. No hepatomegaly. Extremities: no edema Lymphadenopathy: No cervical adenopathy noted. Neurological: Alert and oriented to person place and time. Skin: Skin is warm and dry. No rashes noted. Psychiatric: Normal mood and affect. Behavior is normal.   ASSESSMENT AND PLAN: 65 y/o female here for reassessment of the following issues:  GERD / belching / vomiting - extensive workup. She clearly has pathologic reflux on pH study which had a strong symptom index. She has been a variety of PPIs but state they all "make  belching worse" and doesn't tolerate it. H2 blockers do not work, a trial of baclofen was not successful. I spent a lot of time with the patient and her husband today discussing all of her symptoms. She is currently not on any antacid and has worsening symptoms. I offered her a trial of Dexilant as she has not yet been on this, and also will add liquid Carafate to use PRN as well as Zofran for her nausea. We discussed trying empiric Reglan but she wishes to hold off on that for now. Given her dysphagia with benefit from prior dilation, I offered her a repeat EGD with dilation. Will also reassess for active esophagitis. Moving forward, given  her refractory symptoms we can consider surgical treatment, however with her dysphagia and potential motility issue, this could potentially get worse with a surgery. We otherwise discussed there is a difference between belching and heartburn, and she may be interpreting belching as "worsening of reflux". I suspect she may likely have a component of supragastric belching as she belched throughout the visit today, but apparently per husband does not do this when sleeping. We may refer to behavioral health for biofeedback of supragastric belching / air swallowing moving forward. She agreed otherwise with EGD. She will hold aggrenox 3 days prior to the procedure if okay with prescribing provider and can use aspirin during this time. We otherwise discussed long term risks of PPIs given what has been in the news recently regarding risks of MI / CVA. More studies need to be done regarding this association to determine a true association and cause / effect. Given her significant symptoms, she agreed to short term trial of Dexilant to see if this provides any significant relief.   Dysphagia - unclear if due to subtle stricture, GEJ outflow obstruction noted on manometry, or other. Offered EGD with dilation as she has had benefit from this, will see how she responds.   IBS - prior good  response to Rifaximin but response has wained over time. I offered her a trial of colestid to use PRN, she agreed.   Arnold Cellar, MD [facility] Pager [phone]

## 2016-01-30 NOTE — Telephone Encounter (Signed)
Okay to hold aggrenox 3 days prior to the procedure and can use aspirin during this time.

## 2016-01-31 NOTE — Telephone Encounter (Signed)
Patient informed and verbalized understanding to hold aggrenox and to take aspirin.

## 2016-02-05 ENCOUNTER — Ambulatory Visit: Payer: Medicare Other | Admitting: Family Medicine

## 2016-02-06 ENCOUNTER — Encounter: Payer: Self-pay | Admitting: Family Medicine

## 2016-02-06 ENCOUNTER — Ambulatory Visit (INDEPENDENT_AMBULATORY_CARE_PROVIDER_SITE_OTHER): Payer: Medicare Other | Admitting: Family Medicine

## 2016-02-06 DIAGNOSIS — E039 Hypothyroidism, unspecified: Secondary | ICD-10-CM

## 2016-02-06 DIAGNOSIS — R110 Nausea: Secondary | ICD-10-CM | POA: Diagnosis not present

## 2016-02-06 DIAGNOSIS — I6339 Cerebral infarction due to thrombosis of other cerebral artery: Secondary | ICD-10-CM | POA: Diagnosis not present

## 2016-02-06 MED ORDER — SYNTHROID 125 MCG PO TABS: 125.0000 ug | ORAL_TABLET | Freq: Every day | ORAL | 11 refills | Status: DC

## 2016-02-06 MED ORDER — LEVOTHYROXINE SODIUM 125 MCG PO TABS: 125.0000 ug | ORAL_TABLET | Freq: Every day | ORAL | 11 refills | Status: DC

## 2016-02-06 NOTE — Assessment & Plan Note (Signed)
Stressed importance of continuing aggrenox.. Discussed different options given SE of HA.Marland Kitchen Pt not interested in changing.

## 2016-02-06 NOTE — Patient Instructions (Addendum)
Continue aggrenox.  Start synthroid  125 mcg daily.  Return for TSH in 2-3 weeks.

## 2016-02-06 NOTE — Progress Notes (Signed)
   Subjective:    Patient ID: Tiffany Orozco, female    DOB: 12/16/1950, 65 y.o.   MRN: [id number]  HPI   65 year old female with complicated medical history as well as chronic nausea and burping presents following stopping all her meds to feel better. She had noted nausea always started after starting her meds.   She reports her diarrhea is resolved and no emesis, less headaches. Nausea is better overall, burping is better overall.. Although coffee still makes it recur.  She still plans on GI dilating esophagus as still some issue swallowing.  She is only taking aggrenox for CVA.    Review of Systems  Constitutional: Negative for fatigue.  Respiratory: Negative for shortness of breath.   Cardiovascular: Negative for chest pain.       Objective:   Physical Exam  Constitutional: Vital signs are normal. She appears well-developed and well-nourished. She is cooperative.  Non-toxic appearance. She does not appear ill. No distress.  HENT:  Head: Normocephalic.  Right Ear: Hearing, tympanic membrane, external ear and ear canal normal. Tympanic membrane is not erythematous, not retracted and not bulging.  Left Ear: Hearing, tympanic membrane, external ear and ear canal normal. Tympanic membrane is not erythematous, not retracted and not bulging.  Nose: No mucosal edema or rhinorrhea. Right sinus exhibits no maxillary sinus tenderness and no frontal sinus tenderness. Left sinus exhibits no maxillary sinus tenderness and no frontal sinus tenderness.  Mouth/Throat: Uvula is midline, oropharynx is clear and moist and mucous membranes are normal.  Eyes: Conjunctivae, EOM and lids are normal. Pupils are equal, round, and reactive to light. Lids are everted and swept, no foreign bodies found.  Neck: Trachea normal and normal range of motion. Neck supple. Carotid bruit is not present. No thyroid mass and no thyromegaly present.  Cardiovascular: Normal rate, regular rhythm, S1 normal, S2 normal,  normal heart sounds, intact distal pulses and normal pulses.  Exam reveals no gallop and no friction rub.   No murmur heard. Pulmonary/Chest: Effort normal and breath sounds normal. No tachypnea. No respiratory distress. She has no decreased breath sounds. She has no wheezes. She has no rhonchi. She has no rales.  Abdominal: Soft. Normal appearance and bowel sounds are normal. There is no tenderness.  Neurological: She is alert.  Skin: Skin is warm, dry and intact. No rash noted.  Psychiatric: Her speech is normal and behavior is normal. Judgment and thought content normal. Her mood appears not anxious. Cognition and memory are normal. She does not exhibit a depressed mood.          Assessment & Plan:

## 2016-02-06 NOTE — Assessment & Plan Note (Signed)
Start on synthroid, check tsh in 2-3 weeks.

## 2016-02-06 NOTE — Assessment & Plan Note (Signed)
IMproved off multiple meds. Pt working on healthy eating and weight loss.

## 2016-02-06 NOTE — Addendum Note (Signed)
Addended by: Carter Kitten on: 02/06/2016 10:40 AM   Modules accepted: Orders

## 2016-02-06 NOTE — Progress Notes (Signed)
Pre visit review using our clinic review tool, if applicable. No additional management support is needed unless otherwise documented below in the visit note. 

## 2016-03-01 ENCOUNTER — Ambulatory Visit (AMBULATORY_SURGERY_CENTER): Payer: Medicare Other | Admitting: Gastroenterology

## 2016-03-01 ENCOUNTER — Telehealth: Payer: Self-pay | Admitting: Gastroenterology

## 2016-03-01 ENCOUNTER — Encounter: Payer: Self-pay | Admitting: Gastroenterology

## 2016-03-01 VITALS — BP 163/87 | HR 76 | Temp 99.1°F | Resp 13

## 2016-03-01 DIAGNOSIS — K219 Gastro-esophageal reflux disease without esophagitis: Secondary | ICD-10-CM | POA: Diagnosis not present

## 2016-03-01 DIAGNOSIS — R1310 Dysphagia, unspecified: Secondary | ICD-10-CM | POA: Diagnosis not present

## 2016-03-01 DIAGNOSIS — K3189 Other diseases of stomach and duodenum: Secondary | ICD-10-CM | POA: Diagnosis not present

## 2016-03-01 DIAGNOSIS — J449 Chronic obstructive pulmonary disease, unspecified: Secondary | ICD-10-CM | POA: Diagnosis not present

## 2016-03-01 DIAGNOSIS — K222 Esophageal obstruction: Secondary | ICD-10-CM | POA: Diagnosis not present

## 2016-03-01 DIAGNOSIS — Z8673 Personal history of transient ischemic attack (TIA), and cerebral infarction without residual deficits: Secondary | ICD-10-CM | POA: Diagnosis not present

## 2016-03-01 DIAGNOSIS — K319 Disease of stomach and duodenum, unspecified: Secondary | ICD-10-CM | POA: Diagnosis not present

## 2016-03-01 MED ORDER — SODIUM CHLORIDE 0.9 % IV SOLN: 500.0000 mL | INTRAVENOUS | Status: DC

## 2016-03-01 MED ORDER — ESOMEPRAZOLE MAGNESIUM 40 MG PO CPDR: 40.0000 mg | DELAYED_RELEASE_CAPSULE | Freq: Two times a day (BID) | ORAL | 3 refills | Status: DC

## 2016-03-01 MED ORDER — ESOMEPRAZOLE MAGNESIUM 40 MG PO PACK: PACK | ORAL | 3 refills | Status: DC

## 2016-03-01 NOTE — Progress Notes (Signed)
Patient has nausea and vomiting in recovery, Dr.Armbruster notified, verbal order given for zofran 4 mg IV. This was started by Everardo All but IV infiltrated so patient did not get any medications. Patient does not want new IV started, she states she has zofran at home and will take it at home. IV removed by Everardo All.

## 2016-03-01 NOTE — Progress Notes (Signed)
Called to room to assist during endoscopic procedure.  Patient ID and intended procedure confirmed with present staff. Received instructions for my participation in the procedure from the performing physician.  

## 2016-03-01 NOTE — Progress Notes (Signed)
A/ox3, pleased with MAC, report to RN 

## 2016-03-01 NOTE — Telephone Encounter (Signed)
Pt is having problems getting her Nexium and asking for a return call

## 2016-03-01 NOTE — Patient Instructions (Addendum)
Biopsies taken today. Result letter in your mail in 2-3 weeks. Pick up new medication Nexium from your pharmacy. Stop Dexilant.  If tolerated, try Zantac 150 mg twice a day. Along with carafate, and Gaviscon OTC as needed. Do NOT take ibuprofen, naproxen, or other non-steriodal anti-inflammatory medications.   YOU HAD AN ENDOSCOPIC PROCEDURE TODAY AT [facility]:   Refer to the procedure report that was given to you for any specific questions about what was found during the examination.  If the procedure report does not answer your questions, please call your gastroenterologist to clarify.  If you requested that your care partner not be given the details of your procedure findings, then the procedure report has been included in a sealed envelope for you to review at your convenience later.  YOU SHOULD EXPECT: Some feelings of bloating in the abdomen. Passage of more gas than usual.  Walking can help get rid of the air that was put into your GI tract during the procedure and reduce the bloating. If you had a lower endoscopy (such as a colonoscopy or flexible sigmoidoscopy) you may notice spotting of blood in your stool or on the toilet paper. If you underwent a bowel prep for your procedure, you may not have a normal bowel movement for a few days.  Please Note:  You might notice some irritation and congestion in your nose or some drainage.  This is from the oxygen used during your procedure.  There is no need for concern and it should clear up in a day or so.  SYMPTOMS TO REPORT IMMEDIATELY:    Following upper endoscopy (EGD)  Vomiting of blood or coffee ground material  New chest pain or pain under the shoulder blades  Painful or persistently difficult swallowing  New shortness of breath  Fever of 100F or higher  Black, tarry-looking stools  For urgent or emergent issues, a gastroenterologist can be reached at any hour by calling [phone].   DIET:  Dilation diet  today, see handout given. Nothing by mouth for 1 hour, then clear liquids for 1 hour, then soft diet rest of today!!  Drink plenty of fluids but you should avoid alcoholic beverages for 24 hours.  ACTIVITY:  You should plan to take it easy for the rest of today and you should NOT DRIVE or use heavy machinery until tomorrow (because of the sedation medicines used during the test).    FOLLOW UP: Our staff will call the number listed on your records the next business day following your procedure to check on you and address any questions or concerns that you may have regarding the information given to you following your procedure. If we do not reach you, we will leave a message.  However, if you are feeling well and you are not experiencing any problems, there is no need to return our call.  We will assume that you have returned to your regular daily activities without incident.  If any biopsies were taken you will be contacted by phone or by letter within the next 1-3 weeks.  Please call us at [phone] if you have not heard about the biopsies in 3 weeks.    SIGNATURES/CONFIDENTIALITY: You and/or your care partner have signed paperwork which will be entered into your electronic medical record.  These signatures attest to the fact that that the information above on your After Visit Summary has been reviewed and is understood.  Full responsibility of the confidentiality of this discharge  information lies with you and/or your care-partner. 

## 2016-03-01 NOTE — Telephone Encounter (Signed)
Spoke with patient. I called [facility] and clarified Nexium RX. Patient aware. Patient verbalizes understanding on how to take the Nexium.

## 2016-03-01 NOTE — Op Note (Signed)
[place] Patient Name: Tiffany Orozco Procedure Date: 03/01/2016 10:15 AM MRN: [id number] Endoscopist: Remo Lipps P. [name] , MD Age: 65 Referring MD:  Date of Birth: 06-Aug-1950 Gender: Female Account #: [id number] Procedure:                Upper GI endoscopy Indications:              Dysphagia, Heartburn - patient intolerant of                            several PPIs and H2 blockers in the past Medicines:                Monitored Anesthesia Care Procedure:                Pre-Anesthesia Assessment:                           - Prior to the procedure, a History and Physical                            was performed, and patient medications and                            allergies were reviewed. The patient's tolerance of                            previous anesthesia was also reviewed. The risks                            and benefits of the procedure and the sedation                            options and risks were discussed with the patient.                            All questions were answered, and informed consent                            was obtained. Prior Anticoagulants: The patient has                            taken antiplatelet medication, last dose was 3 days                            prior to procedure. ASA Grade Assessment: III - A                            patient with severe systemic disease. After                            reviewing the risks and benefits, the patient was                            deemed in satisfactory condition to undergo the  procedure.                           After obtaining informed consent, the endoscope was                            passed under direct vision. Throughout the                            procedure, the patient's blood pressure, pulse, and                            oxygen saturations were monitored continuously. The                            Model GIF-HQ190 [id number]) scope was  introduced                            through the mouth, and advanced to the second part                            of duodenum. The upper GI endoscopy was                            accomplished without difficulty. The patient                            tolerated the procedure well. Scope In: Scope Out: Findings:                 Esophagogastric landmarks were identified: the                            Z-line was found at 36 cm, the gastroesophageal                            junction was found at 36 cm and the upper extent of                            the gastric folds was found at 36 cm from the                            incisors.                           LA Grade C (one or more mucosal breaks continuous                            between tops of 2 or more mucosal folds, less than                            75% circumference) esophagitis was found in thie                            distal esophagus.  There was a very subtle Shatski                            ring noted in this area                           The exam of the esophagus was otherwise normal.                           A guidewire was placed and the scope was withdrawn.                            Dilation was performed in the entire esophagus with                            a Savary dilator with no resistance at 17 mm and 18                            mm. No mucosal wrents were appreciated after this                            intervention. Biopsies were taken with a cold                            forceps in the upper third of the esophagus, in the                            middle third of the esophagus and in the lower                            third of the esophagus for histology.                           Diffuse mild inflammation characterized by adherent                            blood, erosions and erythema was found in the                            entire examined stomach. Biopsies were taken with a                             cold forceps for Helicobacter pylori testing.                           One non-bleeding superficial duodenal ulcer with no                            stigmata of bleeding was found in the duodenal                            bulb. The lesion was 4 mm in largest dimension.  Diffuse mildly erythematous mucosa was found in the                            second portion of the duodenum. Biopsies were taken                            with a cold forceps for histology. Complications:            No immediate complications. Estimated blood loss:                            Minimal. Estimated Blood Loss:     Estimated blood loss was minimal. Impression:               - Esophagogastric landmarks identified.                           - LA Grade C reflux esophagitis.                           - Gastritis. Biopsied.                           - One non-bleeding duodenal ulcer with no stigmata                            of bleeding.                           - Erythematous duodenopathy. Biopsied.                           - Dilation performed in the entire esophagus for                            symptoms of dysphagia                           - Biopsies were taken with a cold forceps for                            histology in the upper third of the esophagus, in                            the middle third of the esophagus and in the lower                            third of the esophagus. Recommendation:           - Patient has a contact number available for                            emergencies. The signs and symptoms of potential                            delayed complications were discussed with the  patient. Return to normal activities tomorrow.                            Written discharge instructions were provided to the                            patient.                           - Resume previous diet.                            - Continue present medications                           - Trial of Dexilant if tolerated. If intolerant,                            would try Zantac 150mg  BID, along with liquid                            carafate every 6 hours (already prescribed), and                            Gaviscon OTC PRN                           - Resume aggrenox tonight                           - No ibuprofen, naproxen, or other non-steroidal                            anti-inflammatory drugs                           - Await pathology results. Remo Lipps P. Armbruster, MD 03/01/2016 10:44:41 AM This report has been signed electronically.

## 2016-03-01 NOTE — Addendum Note (Signed)
Addended by: Levonne Spiller on: 03/01/2016 04:39 PM   Modules accepted: Orders

## 2016-03-01 NOTE — Telephone Encounter (Signed)
Spoke with pt. She has picked up medication.

## 2016-03-02 ENCOUNTER — Telehealth: Payer: Self-pay | Admitting: *Deleted

## 2016-03-02 NOTE — Telephone Encounter (Signed)
  Follow up Call-  Call back number 03/01/2016 07/03/2014  Post procedure Call Back phone  # [phone]  Permission to leave phone message Yes Yes  Some recent data might be hidden     Patient questions:  Do you have a fever, pain , or abdominal swelling? Yes.   Pain Score  7 *  Have you tolerated food without any problems? Yes.    Have you been able to return to your normal activities? Yes.    Do you have any questions about your discharge instructions: Diet   No. Medications  No. Follow up visit  No.  Do you have questions or concerns about your Care? No.  Actions: * If pain score is 4 or above: Pain Complaint Variance Form Initiated.  Note sent to T Silvio Clayman RN.  Pt c/o hand pain at IV site-  "small knot where they first tried to start IV and large knot where IV was started."  Unable to describe size of "knot."  Rated hand pain as a "7-8."  No bruising, numbness or tingling to hand; pt just states that it aches.  I told her try warm compresses, alternating with ice if pain continues.  Advised to take Tylenol as needed.  Will call back if pain increases.  C/o slight sore throat, but able to swallow and eat without problems.

## 2016-03-10 ENCOUNTER — Encounter: Payer: Self-pay | Admitting: Gastroenterology

## 2016-03-20 DIAGNOSIS — K319 Disease of stomach and duodenum, unspecified: Secondary | ICD-10-CM | POA: Diagnosis not present

## 2016-03-20 DIAGNOSIS — K3189 Other diseases of stomach and duodenum: Secondary | ICD-10-CM | POA: Diagnosis not present

## 2016-04-12 ENCOUNTER — Ambulatory Visit: Payer: Medicare Other | Admitting: Cardiovascular Disease

## 2016-04-15 NOTE — Telephone Encounter (Signed)
Opened in error

## 2016-04-25 ENCOUNTER — Telehealth: Payer: Self-pay | Admitting: Family Medicine

## 2016-04-25 DIAGNOSIS — M8589 Other specified disorders of bone density and structure, multiple sites: Secondary | ICD-10-CM

## 2016-04-25 DIAGNOSIS — E039 Hypothyroidism, unspecified: Secondary | ICD-10-CM

## 2016-04-25 DIAGNOSIS — D473 Essential (hemorrhagic) thrombocythemia: Secondary | ICD-10-CM

## 2016-04-25 DIAGNOSIS — D75839 Thrombocytosis, unspecified: Secondary | ICD-10-CM

## 2016-04-25 DIAGNOSIS — E7800 Pure hypercholesterolemia, unspecified: Secondary | ICD-10-CM

## 2016-04-25 NOTE — Telephone Encounter (Signed)
-----   Message from Marchia Bond sent at 04/19/2016 10:57 AM EST ----- Regarding: 6 mo f/u labs Mon 11/27, need orders. Thanks! :-) Please order Future 6 mo f/u labs for pt's upcoming lab appt. Thanks Aniceto Boss

## 2016-04-25 NOTE — Telephone Encounter (Signed)
Labs ordered.

## 2016-04-26 ENCOUNTER — Other Ambulatory Visit (INDEPENDENT_AMBULATORY_CARE_PROVIDER_SITE_OTHER): Payer: Medicare Other

## 2016-04-26 DIAGNOSIS — E7800 Pure hypercholesterolemia, unspecified: Secondary | ICD-10-CM

## 2016-04-26 LAB — COMPREHENSIVE METABOLIC PANEL
ALT: 16 U/L (ref 0–35)
AST: 15 U/L (ref 0–37)
Albumin: 4 g/dL (ref 3.5–5.2)
Alkaline Phosphatase: 99 U/L (ref 39–117)
BUN: 16 mg/dL (ref 6–23)
CO2: 27 mEq/L (ref 19–32)
Calcium: 9.2 mg/dL (ref 8.4–10.5)
Chloride: 101 mEq/L (ref 96–112)
Creatinine, Ser: 0.79 mg/dL (ref 0.40–1.20)
GFR: 77.03 mL/min (ref >60.00)
Glucose, Bld: 97 mg/dL (ref 70–99)
Potassium: 4.5 mEq/L (ref 3.5–5.1)
Sodium: 136 mEq/L (ref 135–145)
Total Bilirubin: 0.4 mg/dL (ref 0.2–1.2)
Total Protein: 7.1 g/dL (ref 6.0–8.3)

## 2016-04-26 LAB — LIPID PANEL
Cholesterol: 209 mg/dL — ABNORMAL HIGH (ref 0–200)
HDL: 36.1 mg/dL — ABNORMAL LOW (ref >39.00)
LDL Cholesterol: 140 mg/dL — ABNORMAL HIGH (ref 0–99)
NonHDL: 172.72
Total CHOL/HDL Ratio: 6
Triglycerides: 165 mg/dL — ABNORMAL HIGH (ref 0.0–149.0)
VLDL: 33 mg/dL (ref 0.0–40.0)

## 2016-04-29 ENCOUNTER — Ambulatory Visit (INDEPENDENT_AMBULATORY_CARE_PROVIDER_SITE_OTHER): Payer: Medicare Other | Admitting: Cardiovascular Disease

## 2016-04-29 ENCOUNTER — Encounter: Payer: Self-pay | Admitting: Cardiovascular Disease

## 2016-04-29 ENCOUNTER — Encounter: Payer: Self-pay | Admitting: Family Medicine

## 2016-04-29 ENCOUNTER — Ambulatory Visit (INDEPENDENT_AMBULATORY_CARE_PROVIDER_SITE_OTHER): Payer: Medicare Other | Admitting: Family Medicine

## 2016-04-29 VITALS — BP 145/76 | HR 75 | Temp 97.7°F | Ht 59.25 in | Wt 175.5 lb

## 2016-04-29 VITALS — BP 140/60 | HR 72 | Ht 60.0 in | Wt 175.8 lb

## 2016-04-29 DIAGNOSIS — E7800 Pure hypercholesterolemia, unspecified: Secondary | ICD-10-CM

## 2016-04-29 DIAGNOSIS — Z7689 Persons encountering health services in other specified circumstances: Secondary | ICD-10-CM | POA: Diagnosis not present

## 2016-04-29 DIAGNOSIS — Z23 Encounter for immunization: Secondary | ICD-10-CM | POA: Diagnosis not present

## 2016-04-29 DIAGNOSIS — J301 Allergic rhinitis due to pollen: Secondary | ICD-10-CM

## 2016-04-29 DIAGNOSIS — I6339 Cerebral infarction due to thrombosis of other cerebral artery: Secondary | ICD-10-CM

## 2016-04-29 DIAGNOSIS — R0602 Shortness of breath: Secondary | ICD-10-CM

## 2016-04-29 DIAGNOSIS — I10 Essential (primary) hypertension: Secondary | ICD-10-CM

## 2016-04-29 MED ORDER — EZETIMIBE 10 MG PO TABS
10.0000 mg | ORAL_TABLET | Freq: Every day | ORAL | 3 refills | Status: DC
Start: 2016-04-29 — End: 2017-03-22

## 2016-04-29 MED ORDER — FLUTICASONE PROPIONATE 50 MCG/ACT NA SUSP: 2.0000 | Freq: Every day | NASAL | 6 refills | Status: DC

## 2016-04-29 NOTE — Progress Notes (Signed)
   Subjective:    Patient ID: Tiffany Orozco, female    DOB: Mar 01, 1951, 65 y.o.   MRN: [id number]  HPI    65 year old female presents for follow up 6 months.   She was feeling well unitl she left house today. Eyes watering, congestion, runny nose. Feels lightheaded.  Feeling chilly.  Passed trees being cut on the way here.  No SOB, no wheeze.  Cetrizine daily at night.  Hypertension:    Elevated on no medication. Was on HCTZ and amlodipine in past.  At home BP was 126-130/70-80s BP Readings from Last 3 Encounters:  04/29/16 (!) 145/76  03/01/16 (!) 163/87  02/06/16 138/74  Using medication without problems or lightheadedness: none Chest pain with exertion: none Edema:None Short of breath: None Average home BPs: see above Other issues: HAs appt with cardiologist today.  Elevated Cholesterol:  Pt high risk CVD given past CVA.   SE to statin in past.  Trial of zetia 2016: stopped given nausea.Faythe Ghee to restart. Welchol too expensive Lab Results  Component Value Date   CHOL 209 (H) 04/26/2016   HDL 36.10 (L) 04/26/2016   LDLCALC 140 (H) 04/26/2016   LDLDIRECT 135.0 01/15/2015   TRIG 165.0 (H) 04/26/2016   CHOLHDL 6 04/26/2016  Diet compliance: avoids greasy foods, low chol diet., limited with diet given chronic nausea Exercise:  Walks some Other complaints:   Review of Systems  Constitutional: Negative for fatigue and fever.  HENT: Negative for ear pain.   Eyes: Negative for pain.  Respiratory: Negative for chest tightness and shortness of breath.   Cardiovascular: Negative for chest pain, palpitations and leg swelling.  Gastrointestinal: Negative for abdominal pain.  Genitourinary: Negative for dysuria.       Objective:   Physical Exam  Constitutional: Vital signs are normal. She appears well-developed and well-nourished. She is cooperative.  Non-toxic appearance. She does not appear ill. No distress.  HENT:  Head: Normocephalic.  Right Ear: Hearing, tympanic  membrane, external ear and ear canal normal. Tympanic membrane is not erythematous, not retracted and not bulging.  Left Ear: Hearing, tympanic membrane, external ear and ear canal normal. Tympanic membrane is not erythematous, not retracted and not bulging.  Nose: Mucosal edema and rhinorrhea present. Right sinus exhibits no maxillary sinus tenderness and no frontal sinus tenderness. Left sinus exhibits no maxillary sinus tenderness and no frontal sinus tenderness.  Mouth/Throat: Uvula is midline, oropharynx is clear and moist and mucous membranes are normal.  Eyes: Conjunctivae, EOM and lids are normal. Pupils are equal, round, and reactive to light. Lids are everted and swept, no foreign bodies found.  Neck: Trachea normal and normal range of motion. Neck supple. Carotid bruit is not present. No thyroid mass and no thyromegaly present.  Cardiovascular: Normal rate, regular rhythm, S1 normal, S2 normal, normal heart sounds, intact distal pulses and normal pulses.  Exam reveals no gallop and no friction rub.   No murmur heard. Pulmonary/Chest: Effort normal and breath sounds normal. No tachypnea. No respiratory distress. She has no decreased breath sounds. She has no wheezes. She has no rhonchi. She has no rales.  Neurological: She is alert.  Skin: Skin is warm, dry and intact. No rash noted.  Psychiatric: Her speech is normal and behavior is normal. Judgment normal. Her mood appears not anxious. Cognition and memory are normal. She does not exhibit a depressed mood.          Assessment & Plan:

## 2016-04-29 NOTE — Assessment & Plan Note (Signed)
Follow BP at home.. Call if it is remaining > 140/90 in next 1-2 weeks. We may need to start back on medications.

## 2016-04-29 NOTE — Progress Notes (Signed)
Pre visit review using our clinic review tool, if applicable. No additional management support is needed unless otherwise documented below in the visit note. 

## 2016-04-29 NOTE — Progress Notes (Signed)
Cardiology Office Note   Date:  04/29/2016   ID:  Tiffany Orozco, DOB 1951/02/23, MRN [id number]  PCP:  [name], MD  Cardiologist:   Jenkins Rouge, MD   Chief Complaint  Patient presents with  . Establish Care      History of Present Illness: Tiffany Orozco is a 65 y.o. female who presents for evaluation of palpitations  And question of CHF She is a bit of a rambling historian. Complains mostly of GI illness and diarrhea that became better When she stopped all her meds. Just put back on cholesterol meds BP labile and up a bit but has Not been started back on BP meds. History of stroke in 2013 on Aggrenox . CVA thought due to small Vessel disease but got TPA.  Echo then showed mild AS with mean gradient of 9 mmHg. Has had cough and angioedema with ACE     Last echo 06/11/15 reviewed EF 60-65% mild LVH mean gradient 11 mmHg and peak 26 mmHg  Mild dyspnea with exertion no chest pain No palpitations on my interview.     Past Medical History:  Diagnosis Date  . Allergy   . Anxiety   . COPD (chronic obstructive pulmonary disease) ([place])   . DDD (degenerative disc disease), lumbar    mild  . Depression   . Female rectocele without uterine prolapse   . GERD (gastroesophageal reflux disease)   . Heart murmur   . Hyperlipidemia   . Hypertension   . Hypothyroidism   . IBS (irritable bowel syndrome)   . Influenza A 06/2015  . Obesity   . Osteopenia   . Skin cancer of arm   . Skin cancer of face   . Stroke ([place]) 07/2010   no permenant deficits  . Thrombocytopenia ([place])     Past Surgical History:  Procedure Laterality Date  . APPENDECTOMY    . CHOLECYSTECTOMY  2004  . EMGs  7/04   atms and wrists neg  . ESOPHAGEAL MANOMETRY N/A 03/17/2015   Procedure: ESOPHAGEAL MANOMETRY (EM);  Surgeon: Manus Gunning, MD;  Location: [facility];  Service: Gastroenterology;  Laterality: N/A;  . FOOT SURGERY  03/31/2009   R foot  . FOOT SURGERY  2008, 2009, 2010   right foot   . TUBAL LIGATION  1975     Current Outpatient Prescriptions  Medication Sig Dispense Refill  . ARMOUR THYROID 90 MG tablet TAKE 1 TABLET (90 MG TOTAL) BY MOUTH DAILY.  3  . Cholecalciferol (VITAMIN D3) 2000 units TABS Take 1 tablet by mouth daily.    . Coenzyme Q10 (COQ10 PO) Take 1 capsule by mouth daily.    Marland Kitchen dipyridamole-aspirin (AGGRENOX) 200-25 MG 12hr capsule TAKE 1 CAPSULE BY MOUTH 2 (TWO) TIMES DAILY. 180 capsule 1  . esomeprazole (NEXIUM) 40 MG capsule Take 1 capsule (40 mg total) by mouth 2 (two) times daily before a meal. 60 capsule 3  . ezetimibe (ZETIA) 10 MG tablet Take 1 tablet (10 mg total) by mouth daily. 90 tablet 3  . fluticasone (FLONASE) 50 MCG/ACT nasal spray Place 2 sprays into both nostrils daily. 16 g 6  . ondansetron (ZOFRAN-ODT) 8 MG disintegrating tablet     . SYNTHROID 125 MCG tablet Take 1 tablet (125 mcg total) by mouth daily before breakfast. 30 tablet 11   Current Facility-Administered Medications  Medication Dose Route Frequency Provider Last Rate Last Dose  . 0.9 %  sodium chloride infusion  500 mL Intravenous  Continuous Manus Gunning, MD        Allergies:   Lisinopril; Niacin; Welchol [colesevelam hcl]; Augmentin [amoxicillin-pot clavulanate]; Codeine; and Oxycodone hcl    Social History:  The patient  reports that she has been smoking Cigarettes.  She has a 31.00 pack-year smoking history. She has never used smokeless tobacco. She reports that she does not drink alcohol or use drugs.   Family History:  The patient's family history includes Hypertension in her mother; Skin cancer in her father.    ROS:  Please see the history of present illness.   Otherwise, review of systems are positive for none.   All other systems are reviewed and negative.    PHYSICAL EXAM: VS:  BP 140/60   Pulse 72   Ht 5' (1.524 m)   Wt 79.7 kg (175 lb 12.8 oz)   SpO2 98%   BMI 34.33 kg/m  , BMI Body mass index is 34.33 kg/m. Affect  appropriate Healthy:  appears stated age 48: normal Neck supple with no adenopathy JVP normal no bruits no thyromegaly Lungs clear with no wheezing and good diaphragmatic motion Heart:  S1/S2 SEM mild AS murmur, no rub, gallop or click PMI normal Abdomen: benighn, BS positve, no tenderness, no AAA no bruit.  No HSM or HJR Distal pulses intact with no bruits No edema Neuro non-focal Skin warm and dry No muscular weakness    EKG:   06/10/15 SR rate 98 nonspecific ST changes    Recent Labs: 06/11/2015: B Natriuretic Peptide 75.8 06/16/2015: Magnesium 2.0 09/26/2015: Hemoglobin 14.1; Platelets 384.0; TSH 0.93 04/26/2016: ALT 16; BUN 16; Creatinine, Ser 0.79; Potassium 4.5; Sodium 136    Lipid Panel    Component Value Date/Time   CHOL 209 (H) 04/26/2016 0812   TRIG 165.0 (H) 04/26/2016 0812   HDL 36.10 (L) 04/26/2016 0812   CHOLHDL 6 04/26/2016 0812   VLDL 33.0 04/26/2016 0812   LDLCALC 140 (H) 04/26/2016 0812   LDLDIRECT 135.0 01/15/2015 0925      Wt Readings from Last 3 Encounters:  04/29/16 79.7 kg (175 lb 12.8 oz)  04/29/16 79.6 kg (175 lb 8 oz)  02/06/16 79.3 kg (174 lb 12 oz)      Other studies Reviewed: Additional studies/ records that were reviewed today include: Cardiology notes echo 2013 Admission notes CT from CVA 2013 notes Dr Diona Browner.    ASSESSMENT AND PLAN:  1.  CHF:  No clinical documentation. EF normal Currently not fluid overloaded will f/u echo No mention of diastolic dysfunction on last echo 2. HTN:  Borderline will follow at home Discussed weight loss and low sodium diet Has had Good results with norvasc in past Can be restarted by primary in next few weeks if home readings high. 3. Chol:  Zetia started by primary today f/u labs with them target LDL 70 with stroke history 4. CVA: occurred while smoking continue Aggrenox 5. AS: mild f/u echo murmur is not impressive    Current medicines are reviewed at length with the patient today.  The  patient does not have concerns regarding medicines.  The following changes have been made:  no change  Labs/ tests ordered today include: Echo   Orders Placed This Encounter  Procedures  . ECHOCARDIOGRAM COMPLETE     Disposition:   FU with me PRN      Signed, Jenkins Rouge, MD  04/29/2016 4:51 PM    [facility] [address], [place], [place]  [address] Phone: [phone]; Fax: [phone]

## 2016-04-29 NOTE — Assessment & Plan Note (Signed)
Continue antihistamine and add nasal steroid spray.

## 2016-04-29 NOTE — Patient Instructions (Addendum)

## 2016-04-29 NOTE — Addendum Note (Signed)
Addended by: Carter Kitten on: 04/29/2016 12:15 PM   Modules accepted: Orders

## 2016-04-29 NOTE — Patient Instructions (Addendum)
Follow BP at home.. Call if > 140/90.  Restart zetia for cholesterol control.  Get back to regular exercise.. 150 min per week as goal  Use flonase  2 sprays per nostril daily in addition to  current allergy med.

## 2016-04-29 NOTE — Assessment & Plan Note (Signed)
Restart zetia. Pt refuses statin even though she is high risk for CVD given past SE.

## 2016-05-04 ENCOUNTER — Encounter: Payer: Self-pay | Admitting: Family Medicine

## 2016-05-04 ENCOUNTER — Telehealth: Payer: Self-pay | Admitting: Acute Care

## 2016-05-04 ENCOUNTER — Other Ambulatory Visit: Payer: Self-pay | Admitting: Acute Care

## 2016-05-04 DIAGNOSIS — F17210 Nicotine dependence, cigarettes, uncomplicated: Secondary | ICD-10-CM

## 2016-05-04 DIAGNOSIS — I2510 Atherosclerotic heart disease of native coronary artery without angina pectoris: Secondary | ICD-10-CM | POA: Insufficient documentation

## 2016-05-04 NOTE — Telephone Encounter (Signed)
The results of the low dose CT were reviewed with the patient who verbalized understanding.I explained that we will schedule her annual  follow up scan 09/2016. She verbalized understanding and had no further questions at completion of the call.

## 2016-05-27 ENCOUNTER — Other Ambulatory Visit: Payer: Self-pay

## 2016-05-27 ENCOUNTER — Ambulatory Visit (HOSPITAL_COMMUNITY): Payer: Medicare Other | Attending: Cardiovascular Disease

## 2016-05-27 DIAGNOSIS — R0602 Shortness of breath: Secondary | ICD-10-CM | POA: Insufficient documentation

## 2016-06-02 ENCOUNTER — Telehealth: Payer: Self-pay | Admitting: Cardiovascular Disease

## 2016-06-02 NOTE — Telephone Encounter (Signed)
Called patient with echo results.

## 2016-06-02 NOTE — Telephone Encounter (Signed)
Left message for patient to call back  

## 2016-06-02 NOTE — Telephone Encounter (Signed)
Called patient back with echo results. 

## 2016-06-02 NOTE — Telephone Encounter (Signed)
New message ° ° ° ° ° °Calling to get echo results °

## 2016-06-02 NOTE — Telephone Encounter (Signed)
New Message     Returning your call regarding Lab results

## 2016-06-09 ENCOUNTER — Other Ambulatory Visit: Payer: Self-pay | Admitting: Family Medicine

## 2016-07-07 ENCOUNTER — Other Ambulatory Visit: Payer: Self-pay | Admitting: Gastroenterology

## 2016-07-07 DIAGNOSIS — K219 Gastro-esophageal reflux disease without esophagitis: Secondary | ICD-10-CM

## 2016-07-15 ENCOUNTER — Encounter: Payer: Self-pay | Admitting: Family Medicine

## 2016-07-15 DIAGNOSIS — L814 Other melanin hyperpigmentation: Secondary | ICD-10-CM | POA: Diagnosis not present

## 2016-07-15 DIAGNOSIS — L853 Xerosis cutis: Secondary | ICD-10-CM | POA: Diagnosis not present

## 2016-07-15 DIAGNOSIS — D485 Neoplasm of uncertain behavior of skin: Secondary | ICD-10-CM | POA: Diagnosis not present

## 2016-07-15 DIAGNOSIS — D2261 Melanocytic nevi of right upper limb, including shoulder: Secondary | ICD-10-CM | POA: Diagnosis not present

## 2016-07-15 DIAGNOSIS — D225 Melanocytic nevi of trunk: Secondary | ICD-10-CM | POA: Diagnosis not present

## 2016-07-15 DIAGNOSIS — D2271 Melanocytic nevi of right lower limb, including hip: Secondary | ICD-10-CM | POA: Diagnosis not present

## 2016-07-15 DIAGNOSIS — D2272 Melanocytic nevi of left lower limb, including hip: Secondary | ICD-10-CM | POA: Diagnosis not present

## 2016-07-15 DIAGNOSIS — L570 Actinic keratosis: Secondary | ICD-10-CM | POA: Diagnosis not present

## 2016-07-15 DIAGNOSIS — L821 Other seborrheic keratosis: Secondary | ICD-10-CM | POA: Diagnosis not present

## 2016-08-25 ENCOUNTER — Encounter: Payer: Self-pay | Admitting: Family Medicine

## 2016-08-25 DIAGNOSIS — Z1231 Encounter for screening mammogram for malignant neoplasm of breast: Secondary | ICD-10-CM | POA: Diagnosis not present

## 2016-08-25 DIAGNOSIS — M8589 Other specified disorders of bone density and structure, multiple sites: Secondary | ICD-10-CM | POA: Diagnosis not present

## 2016-08-31 ENCOUNTER — Encounter: Payer: Self-pay | Admitting: Family Medicine

## 2016-09-02 ENCOUNTER — Encounter: Payer: Self-pay | Admitting: Family Medicine

## 2016-09-02 ENCOUNTER — Ambulatory Visit (INDEPENDENT_AMBULATORY_CARE_PROVIDER_SITE_OTHER): Payer: Medicare Other | Admitting: Family Medicine

## 2016-09-02 ENCOUNTER — Encounter: Payer: Self-pay | Admitting: *Deleted

## 2016-09-02 VITALS — BP 140/86 | HR 87 | Temp 98.5°F | Ht 65.0 in | Wt 179.8 lb

## 2016-09-02 DIAGNOSIS — I10 Essential (primary) hypertension: Secondary | ICD-10-CM | POA: Diagnosis not present

## 2016-09-02 DIAGNOSIS — E7800 Pure hypercholesterolemia, unspecified: Secondary | ICD-10-CM | POA: Diagnosis not present

## 2016-09-02 DIAGNOSIS — K580 Irritable bowel syndrome with diarrhea: Secondary | ICD-10-CM

## 2016-09-02 DIAGNOSIS — K209 Esophagitis, unspecified without bleeding: Secondary | ICD-10-CM | POA: Insufficient documentation

## 2016-09-02 DIAGNOSIS — K279 Peptic ulcer, site unspecified, unspecified as acute or chronic, without hemorrhage or perforation: Secondary | ICD-10-CM

## 2016-09-02 DIAGNOSIS — K2100 Gastro-esophageal reflux disease with esophagitis, without bleeding: Secondary | ICD-10-CM | POA: Insufficient documentation

## 2016-09-02 LAB — LIPID PANEL
Cholesterol: 159 mg/dL (ref 0–200)
HDL: 35.5 mg/dL — ABNORMAL LOW (ref >39.00)
NonHDL: 123.63
Total CHOL/HDL Ratio: 4
Triglycerides: 220 mg/dL — ABNORMAL HIGH (ref 0.0–149.0)
VLDL: 44 mg/dL — ABNORMAL HIGH (ref 0.0–40.0)

## 2016-09-02 LAB — LDL CHOLESTEROL, DIRECT: Direct LDL: 103 mg/dL

## 2016-09-02 MED ORDER — HYDROCHLOROTHIAZIDE 25 MG PO TABS: ORAL_TABLET | ORAL | 11 refills | Status: DC

## 2016-09-02 MED ORDER — AMLODIPINE BESYLATE 5 MG PO TABS
5.0000 mg | ORAL_TABLET | Freq: Every day | ORAL | 11 refills | Status: DC
Start: 2016-09-02 — End: 2017-06-16

## 2016-09-02 MED ORDER — DEXLANSOPRAZOLE 60 MG PO CPDR: 60.0000 mg | DELAYED_RELEASE_CAPSULE | Freq: Every day | ORAL | 11 refills | Status: DC

## 2016-09-02 NOTE — Progress Notes (Signed)
Pre visit review using our clinic review tool, if applicable. No additional management support is needed unless otherwise documented below in the visit note. 

## 2016-09-02 NOTE — Assessment & Plan Note (Signed)
Trial of low FODMAP diet and continue probiotics.

## 2016-09-02 NOTE — Assessment & Plan Note (Signed)
Encouraged pt to try trial of Dexilant as recommended by GI. Offered referral to tertiary center as pt feels PPI and H2 blockers causes belches.

## 2016-09-02 NOTE — Patient Instructions (Addendum)
Restart amlodipine 5 mg daily.  Follow BP at home.. Call > 140/90.  Refill HCTZ to use as needed for edema. Please stop at the lab to set up to have labs drawn. Stop nexium and restart dexilant.  Look into low FODMAP diet.  Keep follow up as scheduled.

## 2016-09-02 NOTE — Assessment & Plan Note (Signed)
Restart amlodipine 5 mg daily.  Follow BP at home.. Call > 140/90.  Refill HCTZ to use as needed for edema.

## 2016-09-02 NOTE — Assessment & Plan Note (Signed)
Encouraged pt to follow plan laid out by GI.

## 2016-09-02 NOTE — Progress Notes (Signed)
   Subjective:    Patient ID: Tiffany Orozco, female    DOB: [birth year], 66 y.o.   MRN: [id number]  HPI  66 year old female presents for follow up.   Hypertension:   Borderline control on no meds. Has not restarted HCTZ ( using only as needed 1-2 times a week) or amlodipine.  She has headache , vision changes when headache is elevated. BP Readings from Last 3 Encounters:  09/02/16 140/86  04/29/16 140/60  04/29/16 (!) 145/76  Using medication without problems or lightheadedness:  none Chest pain with exertion: none Edema: off and on. Short of breath: none Average home BPs:120/70s-158/90s Other issues: Pt established with Dr. Johnsie Cancel  on 03/2016.  Elevated Cholesterol: Now on zetia  Due for eval . Goal < 70 Lab Results  Component Value Date   CHOL 209 (H) 04/26/2016   HDL 36.10 (L) 04/26/2016   LDLCALC 140 (H) 04/26/2016   LDLDIRECT 135.0 01/15/2015   TRIG 165.0 (H) 04/26/2016   CHOLHDL 6 04/26/2016  Using medications without problems: None Muscle aches:  none Diet compliance: moderate Exercise: none Other complaints:    Review of Systems  Constitutional: Negative for fatigue.  HENT: Negative for ear pain.   Eyes: Negative for pain.  Respiratory: Negative for cough.   Cardiovascular: Positive for palpitations. Negative for chest pain and leg swelling.  Gastrointestinal: Positive for abdominal distention and abdominal pain. Negative for blood in stool.       Objective:   Physical Exam  Constitutional: Vital signs are normal. She appears well-developed and well-nourished. She is cooperative.  Non-toxic appearance. She does not appear ill. No distress.  HENT:  Head: Normocephalic.  Right Ear: Hearing, tympanic membrane, external ear and ear canal normal. Tympanic membrane is not erythematous, not retracted and not bulging.  Left Ear: Hearing, tympanic membrane, external ear and ear canal normal. Tympanic membrane is not erythematous, not retracted and not bulging.    Nose: Mucosal edema and rhinorrhea present. Right sinus exhibits no maxillary sinus tenderness and no frontal sinus tenderness. Left sinus exhibits no maxillary sinus tenderness and no frontal sinus tenderness.  Mouth/Throat: Uvula is midline, oropharynx is clear and moist and mucous membranes are normal.  Eyes: Conjunctivae, EOM and lids are normal. Pupils are equal, round, and reactive to light. Lids are everted and swept, no foreign bodies found.  Neck: Trachea normal and normal range of motion. Neck supple. Carotid bruit is not present. No thyroid mass and no thyromegaly present.  Cardiovascular: Normal rate, regular rhythm, S1 normal, S2 normal, normal heart sounds, intact distal pulses and normal pulses.  Exam reveals no gallop and no friction rub.   No murmur heard. Pulmonary/Chest: Effort normal and breath sounds normal. No tachypnea. No respiratory distress. She has no decreased breath sounds. She has no wheezes. She has no rhonchi. She has no rales.  Abdominal: There is no tenderness. There is no rigidity, no rebound, no guarding and no CVA tenderness. No hernia.  Feels bloated  Neurological: She is alert.  Skin: Skin is warm, dry and intact. No rash noted.  Psychiatric: Her speech is normal and behavior is normal. Judgment normal. Her mood appears not anxious. Cognition and memory are normal. She does not exhibit a depressed mood.          Assessment & Plan:

## 2016-10-15 ENCOUNTER — Other Ambulatory Visit: Payer: Self-pay | Admitting: Family Medicine

## 2016-10-15 NOTE — Telephone Encounter (Signed)
Last office visit 09/02/2016.  Not on current medication list.  Refill?

## 2016-10-27 ENCOUNTER — Telehealth: Payer: Self-pay

## 2016-10-27 NOTE — Telephone Encounter (Signed)
Pt left v/m; pt previously thought that armour thyroid was causing stomach issues; pt now knows it was a different med that was causing stomach issues. Pt wants to be changed back to armour thyroid. [facility].

## 2016-10-28 MED ORDER — ARMOUR THYROID 90 MG PO TABS: 90.0000 mg | ORAL_TABLET | Freq: Every day | ORAL | 11 refills | Status: DC

## 2016-10-28 NOTE — Telephone Encounter (Signed)
Tiffany Orozco notified as instructed by telephone.  Lab appointment scheduled for 11/25/16 at 11:00 am to recheck thyroid levels.

## 2016-10-28 NOTE — Telephone Encounter (Signed)
Let pt know I removed the levothyroxine from her chart and sent  In her previous dose of armour thyroid. She needs to make a 3-4 week lab visit for tsh, free t3 and free t4.

## 2016-10-29 ENCOUNTER — Ambulatory Visit (INDEPENDENT_AMBULATORY_CARE_PROVIDER_SITE_OTHER)
Admission: RE | Admit: 2016-10-29 | Discharge: 2016-10-29 | Disposition: A | Discharge status: Home / Self Care | Payer: Medicare Other | Source: Ambulatory Visit | Attending: Acute Care | Admitting: Acute Care

## 2016-10-29 DIAGNOSIS — F17210 Nicotine dependence, cigarettes, uncomplicated: Secondary | ICD-10-CM

## 2016-10-29 DIAGNOSIS — Z87891 Personal history of nicotine dependence: Secondary | ICD-10-CM | POA: Diagnosis not present

## 2016-11-02 ENCOUNTER — Encounter: Payer: Self-pay | Admitting: Family Medicine

## 2016-11-02 ENCOUNTER — Other Ambulatory Visit (HOSPITAL_COMMUNITY)
Admission: RE | Admit: 2016-11-02 | Discharge: 2016-11-02 | Disposition: A | Discharge status: Home / Self Care | Payer: Medicare Other | Source: Ambulatory Visit | Attending: Family Medicine | Admitting: Family Medicine

## 2016-11-02 ENCOUNTER — Telehealth: Payer: Self-pay | Admitting: Family Medicine

## 2016-11-02 ENCOUNTER — Ambulatory Visit (INDEPENDENT_AMBULATORY_CARE_PROVIDER_SITE_OTHER): Payer: Medicare Other | Admitting: Family Medicine

## 2016-11-02 VITALS — BP 133/87 | HR 97 | Ht 60.0 in | Wt 182.0 lb

## 2016-11-02 DIAGNOSIS — Z01419 Encounter for gynecological examination (general) (routine) without abnormal findings: Secondary | ICD-10-CM | POA: Diagnosis not present

## 2016-11-02 DIAGNOSIS — Z800 Family history of malignant neoplasm of digestive organs: Secondary | ICD-10-CM

## 2016-11-02 DIAGNOSIS — N816 Rectocele: Secondary | ICD-10-CM | POA: Diagnosis not present

## 2016-11-02 DIAGNOSIS — Z801 Family history of malignant neoplasm of trachea, bronchus and lung: Secondary | ICD-10-CM | POA: Diagnosis not present

## 2016-11-02 DIAGNOSIS — Z8041 Family history of malignant neoplasm of ovary: Secondary | ICD-10-CM

## 2016-11-02 DIAGNOSIS — Z808 Family history of malignant neoplasm of other organs or systems: Secondary | ICD-10-CM | POA: Diagnosis not present

## 2016-11-02 DIAGNOSIS — Z803 Family history of malignant neoplasm of breast: Secondary | ICD-10-CM | POA: Diagnosis not present

## 2016-11-02 NOTE — Telephone Encounter (Signed)
Pt has lab appt for thyroid test given she started back on armour thyroid.. Theses cannot be moved earilier.. No toher labs needed.

## 2016-11-02 NOTE — Assessment & Plan Note (Signed)
Given no need for manipulation for defecation, do not recommend surgical treatment at this time. She is not a great surgical candidate with her medical history and her continued smoking.

## 2016-11-02 NOTE — Progress Notes (Signed)
Subjective:     Tiffany Orozco is a 66 y.o. female and is here for a comprehensive physical exam. The patient reports problems - some prolapse. Reports some constipation and ? Rectocele. Previously seen and scheduled for LAVH and A and P repair with Dr. Hulan Fray, but had some health issues that left that out. Despite the rectocele, no manipulation is needed for defecation. Some vulvar sores occasionally  Social History   Social History  . Marital status: Married    Spouse name: Laquonda Welby  . Number of children: 2  . Years of education: N/A   Occupational History  . 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 Smoking status: Current Every Day Smoker    Packs/day: 1.00    Years: 31.00    Types: Cigarettes  . Smokeless tobacco: Never Used     Comment: May try when husband finished treatment for prostate cancer  . Alcohol use No  . Drug use: No  . Sexual activity: Yes    Birth control/ protection: None   Other Topics Concern  . Not on file   Social History Narrative   5/10 husband had open heart surgery, not working, 2 grandchildren, siblings living and healthy   Health Maintenance  Topic Date Due  . INFLUENZA VACCINE  12/29/2016  . MAMMOGRAM  08/26/2018  . TETANUS/TDAP  03/07/2019  . COLONOSCOPY  05/05/2020  . DEXA SCAN  Completed  . Hepatitis C Screening  Completed  . PNA vac Low Risk Adult  Completed    The following portions of the patient's history were reviewed and updated as appropriate: allergies, current medications, past family history, past medical history, past social history, past surgical history and problem list.  Review of Systems Pertinent items noted in HPI and remainder of comprehensive ROS otherwise negative.   Objective:    BP 133/87   Pulse 97   Ht 5' (1.524 m)   Wt 182 lb (82.6 kg)   BMI 35.54 kg/m  General appearance: alert, cooperative and appears stated age Head: Normocephalic, without  obvious abnormality, atraumatic Neck: no adenopathy, supple, symmetrical, trachea midline and thyroid not enlarged, symmetric, no tenderness/mass/nodules Lungs: clear to auscultation bilaterally Breasts: normal appearance, no masses or tenderness Heart: regular rate and rhythm Abdomen: soft, non-tender; bowel sounds normal; no masses,  no organomegaly Pelvic: cervix normal in appearance, external genitalia normal, no adnexal masses or tenderness, no cervical motion tenderness, uterus normal size, shape, and consistency and cystocele and rectocele present Extremities: Homans sign is negative, no sign of DVT Pulses: 2+ and symmetric Skin: Skin color, texture, turgor normal. No rashes or lesions Lymph nodes: Cervical, supraclavicular, and axillary nodes normal. Neurologic: Grossly normal    Assessment:     GYN female exam.      Plan:      Problem List Items Addressed This Visit      Unprioritized   Herniation of rectum into vagina    Given no need for manipulation for defecation, do not recommend surgical treatment at this time. She is not a great surgical candidate with her medical history and her continued smoking.       Other Visit Diagnoses    Well woman exam with routine gynecological exam    -  Primary   Relevant Orders   Cytology - PAP   Encounter for gynecological examination without abnormal finding       Family history of colon cancer  Relevant Orders   Genetic Screening   Family history of ovarian cancer       Hereditary cancer screening offered with increased cancer surveillance if needed.   Relevant Orders   Genetic Screening      See After Visit Summary for Counseling Recommendations

## 2016-11-02 NOTE — Telephone Encounter (Signed)
-----   Message from Eustace Pen, LPN sent at 08/29/4237 12:51 PM EDT ----- Regarding: Labs 6/6 Please place lab orders, if any. If none, please let me know.  Thank you.  Traditional Medicare

## 2016-11-02 NOTE — Patient Instructions (Signed)
Preventive Care 68 Years and Older, Female Preventive care refers to lifestyle choices and visits with your health care provider that can promote health and wellness. What does preventive care include?  A yearly physical exam. This is also called an annual well check.  Dental exams once or twice a year.  Routine eye exams. Ask your health care provider how often you should have your eyes checked.  Personal lifestyle choices, including: ? Daily care of your teeth and gums. ? Regular physical activity. ? Eating a healthy diet. ? Avoiding tobacco and drug use. ? Limiting alcohol use. ? Practicing safe sex. ? Taking low-dose aspirin every day. ? Taking vitamin and mineral supplements as recommended by your health care provider. What happens during an annual well check? The services and screenings done by your health care provider during your annual well check will depend on your age, overall health, lifestyle risk factors, and family history of disease. Counseling Your health care provider may ask you questions about your:  Alcohol use.  Tobacco use.  Drug use.  Emotional well-being.  Home and relationship well-being.  Sexual activity.  Eating habits.  History of falls.  Memory and ability to understand (cognition).  Work and work environment.  Reproductive health.  Screening You may have the following tests or measurements:  Height, weight, and BMI.  Blood pressure.  Lipid and cholesterol levels. These may be checked every 5 years, or more frequently if you are over 50 years old.  Skin check.  Lung cancer screening. You may have this screening every year starting at age 55 if you have a 30-pack-year history of smoking and currently smoke or have quit within the past 15 years.  Fecal occult blood test (FOBT) of the stool. You may have this test every year starting at age 50.  Flexible sigmoidoscopy or colonoscopy. You may have a sigmoidoscopy every 5 years or  a colonoscopy every 10 years starting at age 50.  Hepatitis C blood test.  Hepatitis B blood test.  Sexually transmitted disease (STD) testing.  Diabetes screening. This is done by checking your blood sugar (glucose) after you have not eaten for a while (fasting). You may have this done every 1-3 years.  Bone density scan. This is done to screen for osteoporosis. You may have this done starting at age 68.  Mammogram. This may be done every 1-2 years. Talk to your health care provider about how often you should have regular mammograms.  Talk with your health care provider about your test results, treatment options, and if necessary, the need for more tests. Vaccines Your health care provider may recommend certain vaccines, such as:  Influenza vaccine. This is recommended every year.  Tetanus, diphtheria, and acellular pertussis (Tdap, Td) vaccine. You may need a Td booster every 10 years.  Varicella vaccine. You may need this if you have not been vaccinated.  Zoster vaccine. You may need this after age 60.  Measles, mumps, and rubella (MMR) vaccine. You may need at least one dose of MMR if you were born in 1957 or later. You may also need a second dose.  Pneumococcal 13-valent conjugate (PCV13) vaccine. One dose is recommended after age 68.  Pneumococcal polysaccharide (PPSV23) vaccine. One dose is recommended after age 68.  Meningococcal vaccine. You may need this if you have certain conditions.  Hepatitis A vaccine. You may need this if you have certain conditions or if you travel or work in places where you may be exposed to hepatitis   A.  Hepatitis B vaccine. You may need this if you have certain conditions or if you travel or work in places where you may be exposed to hepatitis B.  Haemophilus influenzae type b (Hib) vaccine. You may need this if you have certain conditions.  Talk to your health care provider about which screenings and vaccines you need and how often you  need them. This information is not intended to replace advice given to you by your health care provider. Make sure you discuss any questions you have with your health care provider. Document Released: 06/13/2015 Document Revised: 02/04/2016 Document Reviewed: 03/18/2015 Elsevier Interactive Patient Education  2017 [employer].

## 2016-11-02 NOTE — Progress Notes (Signed)
Last pap 2013 - normal with negative HPV

## 2016-11-03 ENCOUNTER — Ambulatory Visit (INDEPENDENT_AMBULATORY_CARE_PROVIDER_SITE_OTHER): Payer: Medicare Other

## 2016-11-03 VITALS — BP 140/70 | HR 90 | Temp 97.8°F | Ht 59.25 in | Wt 183.5 lb

## 2016-11-03 DIAGNOSIS — Z0000 Encounter for general adult medical examination without abnormal findings: Secondary | ICD-10-CM | POA: Diagnosis not present

## 2016-11-03 NOTE — Progress Notes (Signed)
Pre visit review using our clinic review tool, if applicable. No additional management support is needed unless otherwise documented below in the visit note. 

## 2016-11-03 NOTE — Progress Notes (Signed)
PCP notes:   Health maintenance:  No gaps identified.  Abnormal screenings:   Hearing - failed Mini-Cog score: 19/20  Patient concerns:   None  Nurse concerns:  None  Next PCP appt:   11/09/16 @ 1115

## 2016-11-03 NOTE — Progress Notes (Signed)
Subjective:   Tiffany Orozco is a 66 y.o. female who presents for Medicare Annual (Subsequent) preventive examination.  Review of Systems:  N/A Cardiac Risk Factors include: advanced age (>92men, >65 women);hypertension;dyslipidemia;obesity (BMI >30kg/m2);smoking/ tobacco exposure     Objective:     Vitals: BP 140/70 (BP Location: Right Arm, Patient Position: Sitting, Cuff Size: Normal)   Pulse 90   Temp 97.8 F (36.6 C) (Oral)   Ht 4' 11.25" (1.505 m) Comment: no shoes  Wt 183 lb 8 oz (83.2 kg)   SpO2 96%   BMI 36.75 kg/m   Body mass index is 36.75 kg/m.   Tobacco History  Smoking Status  . Current Every Day Smoker  . Packs/day: 1.00  . Years: 31.00  . Types: Cigarettes  Smokeless Tobacco  . Never Used    Comment: May try when husband finished treatment for prostate cancer     Ready to quit: No Counseling given: No   Past Medical History:  Diagnosis Date  . Allergy   . Anxiety   . COPD (chronic obstructive pulmonary disease) ([place])   . DDD (degenerative disc disease), lumbar    mild  . Depression   . Female rectocele without uterine prolapse   . GERD (gastroesophageal reflux disease)   . Heart murmur   . Hyperlipidemia   . Hypertension   . Hypothyroidism   . IBS (irritable bowel syndrome)   . Influenza A 06/2015  . Obesity   . Osteopenia   . Skin cancer of arm   . Skin cancer of face   . Stroke ([place]) 07/2010   no permenant deficits  . Thrombocytopenia ([place])    Past Surgical History:  Procedure Laterality Date  . APPENDECTOMY    . CHOLECYSTECTOMY  2004  . EMGs  7/04   atms and wrists neg  . ESOPHAGEAL MANOMETRY N/A 03/17/2015   Procedure: ESOPHAGEAL MANOMETRY (EM);  Surgeon: Manus Gunning, MD;  Location: [facility];  Service: Gastroenterology;  Laterality: N/A;  . FOOT SURGERY  03/31/2009   R foot  . FOOT SURGERY  2008, 2009, 2010   right foot   . TUBAL LIGATION  1975   Family History  Problem Relation Age of Onset  . Skin  cancer Father   . Hypertension Mother   . Cancer Brother        lung  . Cancer Sister        colon  . Cancer Sister        ovarian and esophageal   History  Sexual Activity  . Sexual activity: Yes  . Birth control/ protection: None    Outpatient Encounter Prescriptions as of 11/03/2016  Medication Sig  . amLODipine (NORVASC) 5 MG tablet Take 1 tablet (5 mg total) by mouth daily.  Francia Greaves THYROID 90 MG tablet Take 1 tablet (90 mg total) by mouth daily.  . Cholecalciferol (VITAMIN D3) 2000 units TABS Take 1 tablet by mouth daily.  . Coenzyme Q10 (COQ10 PO) Take 1 capsule by mouth daily.  Marland Kitchen dexlansoprazole (DEXILANT) 60 MG capsule Take 1 capsule (60 mg total) by mouth daily.  Marland Kitchen dipyridamole-aspirin (AGGRENOX) 200-25 MG 12hr capsule TAKE 1 CAPSULE BY MOUTH 2 (TWO) TIMES DAILY.  Marland Kitchen ezetimibe (ZETIA) 10 MG tablet Take 1 tablet (10 mg total) by mouth daily.  . fluticasone (FLONASE) 50 MCG/ACT nasal spray Place 2 sprays into both nostrils daily.  . hydrochlorothiazide (HYDRODIURIL) 25 MG tablet 1 tab po daily prn swelling  . Iodine, Kelp, (  KELP PO) Take 600 mg by mouth daily.  . ondansetron (ZOFRAN-ODT) 8 MG disintegrating tablet   . polyethylene glycol powder (GLYCOLAX/MIRALAX) powder MIX 1 CAPFUL (17 GRAMS) IN 8 OUNCES OF LIQUID AND DRINK DAILY AS NEEDED FOR CONSTIPATION.   No facility-administered encounter medications on file as of 11/03/2016.     Activities of Daily Living In your present state of health, do you have any difficulty performing the following activities: 11/03/2016  Hearing? Y  Vision? Y  Difficulty concentrating or making decisions? Y  Walking or climbing stairs? N  Dressing or bathing? N  Doing errands, shopping? N  Preparing Food and eating ? N  Using the Toilet? N  In the past six months, have you accidently leaked urine? Y  Do you have problems with loss of bowel control? N  Managing your Medications? N  Managing your Finances? N  Housekeeping or managing your  Housekeeping? N  Some recent data might be hidden    Patient Care Team: Jinny Sanders, MD as PCP - General    Assessment:     Hearing Screening   125Hz  250Hz  500Hz  1000Hz  2000Hz  3000Hz  4000Hz  6000Hz  8000Hz   Right ear:   0 40 40  40    Left ear:   0 40 40  40    Vision Screening Comments: Last vision exam in Dec 2017 @ [place] and Dietary recommendations Current Exercise Habits: The patient does not participate in regular exercise at present, Exercise limited by: None identified  Goals    . Increase water intake          Starting 11/03/16, I will attempt to drink at least 6-8 glasses of water daily.       Fall Risk Fall Risk  11/03/2016 10/24/2015 10/15/2014 10/16/2013  Falls in the past year? No No No No   Depression Screen PHQ 2/9 Scores 11/03/2016 10/28/2015 10/15/2014 10/16/2013  PHQ - 2 Score 0 0 0 3  PHQ- 9 Score - 5 - 14     Cognitive Function MMSE - Mini Mental State Exam 11/03/2016  Orientation to time 5  Orientation to Place 5  Registration 3  Attention/ Calculation 0  Recall 2  Recall-comments pt was unable to recall 1 of 3 words  Language- name 2 objects 0  Language- repeat 1  Language- follow 3 step command 3  Language- read & follow direction 0  Write a sentence 0  Copy design 0  Total score 19     PLEASE NOTE: A Mini-Cog screen was completed. Maximum score is 20. A value of 0 denotes this part of [place]stein MMSE was not completed or the patient failed this part of the Mini-Cog screening.   Mini-Cog Screening Orientation to Time - Max 5 pts Orientation to Place - Max 5 pts Registration - Max 3 pts Recall - Max 3 pts Language Repeat - Max 1 pts Language Follow 3 Step Command - Max 3 pts     Immunization History  Administered Date(s) Administered  . Influenza Split 02/18/2011, 03/03/2012  . Influenza Whole 03/06/2007, 03/11/2008, 03/06/2009, 02/24/2010  . Influenza,inj,Quad PF,36+ Mos 02/16/2013, 03/22/2014, 02/25/2015,  04/29/2016  . Pneumococcal Conjugate-13 10/15/2014  . Pneumococcal Polysaccharide-23 10/16/2013  . Td 03/11/1997, 03/06/2009  . Zoster 05/18/2011   Screening Tests Health Maintenance  Topic Date Due  . INFLUENZA VACCINE  12/29/2016  . MAMMOGRAM  08/26/2018  . TETANUS/TDAP  03/07/2019  . COLONOSCOPY  05/05/2020  . DEXA SCAN  Completed  .  Hepatitis C Screening  Completed  . PNA vac Low Risk Adult  Completed      Plan:   I have personally reviewed and addressed the Medicare Annual Wellness questionnaire and have noted the following in the patient's chart:  A. Medical and social history B. Use of alcohol, tobacco or illicit drugs  C. Current medications and supplements D. Functional ability and status E.  Nutritional status F.  Physical activity G. Advance directives H. List of other physicians I.  Hospitalizations, surgeries, and ER visits in previous 12 months J.  [place] to include hearing, vision, cognitive, depression L. Referrals and appointments - none  In addition, I have reviewed and discussed with patient certain preventive protocols, quality metrics, and best practice recommendations. A written personalized care plan for preventive services as well as general preventive health recommendations were provided to patient.  See attached scanned questionnaire for additional information.   Signed,   Lindell Noe, MHA, BS, LPN Health Coach

## 2016-11-03 NOTE — Progress Notes (Signed)
I reviewed health advisor's note, was available for consultation, and agree with documentation and plan.   Signed,  Yusif Gnau T. Jennilee Demarco, MD  

## 2016-11-03 NOTE — Patient Instructions (Signed)
Tiffany Orozco , Thank you for taking time to come for your Medicare Wellness Visit. I appreciate your ongoing commitment to your health goals. Please review the following plan we discussed and let me know if I can assist you in the future.   These are the goals we discussed: Goals    . Increase water intake          Starting 11/03/16, I will attempt to drink at least 6-8 glasses of water daily.        This is a list of the screening recommended for you and due dates:  Health Maintenance  Topic Date Due  . Flu Shot  12/29/2016  . Mammogram  08/26/2018  . Tetanus Vaccine  03/07/2019  . Colon Cancer Screening  05/05/2020  . DEXA scan (bone density measurement)  Completed  .  Hepatitis C: One time screening is recommended by Center for Disease Control  (CDC) for  adults born from [birth year] through 1967.   Completed  . Pneumonia vaccines  Completed   Preventive Care for Adults  A healthy lifestyle and preventive care can promote health and wellness. Preventive health guidelines for adults include the following key practices.  . A routine yearly physical is a good way to check with your health care provider about your health and preventive screening. It is a chance to share any concerns and updates on your health and to receive a thorough exam.  . Visit your dentist for a routine exam and preventive care every 6 months. Brush your teeth twice a day and floss once a day. Good oral hygiene prevents tooth decay and gum disease.  . The frequency of eye exams is based on your age, health, family medical history, use  of contact lenses, and other factors. Follow your health care provider's ecommendations for frequency of eye exams.  . Eat a healthy diet. Foods like vegetables, fruits, whole grains, low-fat dairy products, and lean protein foods contain the nutrients you need without too many calories. Decrease your intake of foods high in solid fats, added sugars, and salt. Eat the right amount of  calories for you. Get information about a proper diet from your health care provider, if necessary.  . Regular physical exercise is one of the most important things you can do for your health. Most adults should get at least 150 minutes of moderate-intensity exercise (any activity that increases your heart rate and causes you to sweat) each week. In addition, most adults need muscle-strengthening exercises on 2 or more days a week.  Silver Sneakers may be a benefit available to you. To determine eligibility, you may visit the website: www.silversneakers.com or contact program at 717 269 7100 Mon-Fri between 8AM-8PM.   . Maintain a healthy weight. The body mass index (BMI) is a screening tool to identify possible weight problems. It provides an estimate of body fat based on height and weight. Your health care provider can find your BMI and can help you achieve or maintain a healthy weight.   For adults 20 years and older: ? A BMI below 18.5 is considered underweight. ? A BMI of 18.5 to 24.9 is normal. ? A BMI of 25 to 29.9 is considered overweight. ? A BMI of 30 and above is considered obese.   . Maintain normal blood lipids and cholesterol levels by exercising and minimizing your intake of saturated fat. Eat a balanced diet with plenty of fruit and vegetables. Blood tests for lipids and cholesterol should begin at age 38 and  be repeated every 5 years. If your lipid or cholesterol levels are high, you are over 50, or you are at high risk for heart disease, you may need your cholesterol levels checked more frequently. Ongoing high lipid and cholesterol levels should be treated with medicines if diet and exercise are not working.  . If you smoke, find out from your health care provider how to quit. If you do not use tobacco, please do not start.  . If you choose to drink alcohol, please do not consume more than 2 drinks per day. One drink is considered to be 12 ounces (355 mL) of beer, 5 ounces  (148 mL) of wine, or 1.5 ounces (44 mL) of liquor.  . If you are 66-54 years old, ask your health care provider if you should take aspirin to prevent strokes.  . Use sunscreen. Apply sunscreen liberally and repeatedly throughout the day. You should seek shade when your shadow is shorter than you. Protect yourself by wearing long sleeves, pants, a wide-brimmed hat, and sunglasses year round, whenever you are outdoors.  . Once a month, do a whole body skin exam, using a mirror to look at the skin on your back. Tell your health care provider of new moles, moles that have irregular borders, moles that are larger than a pencil eraser, or moles that have changed in shape or color.

## 2016-11-04 ENCOUNTER — Other Ambulatory Visit: Payer: Self-pay | Admitting: Acute Care

## 2016-11-04 DIAGNOSIS — F17210 Nicotine dependence, cigarettes, uncomplicated: Secondary | ICD-10-CM

## 2016-11-04 LAB — CYTOLOGY - PAP
Diagnosis: NEGATIVE
HPV: NOT DETECTED

## 2016-11-09 ENCOUNTER — Encounter: Payer: Medicare Other | Admitting: Family Medicine

## 2016-11-23 ENCOUNTER — Encounter: Payer: Medicare Other | Admitting: Family Medicine

## 2016-11-23 ENCOUNTER — Telehealth: Payer: Self-pay | Admitting: Family Medicine

## 2016-11-23 NOTE — Telephone Encounter (Signed)
Okay to schedule her sooner, but she definitely needs a 30 min slot or hold one last 15 min slot same day.

## 2016-11-23 NOTE — Telephone Encounter (Signed)
Pt called - she cancelled her part 2 cpe (medicare)  appt when she came in this morning and wants to reschedule.  She does not want to wait until 08/24 for a cpe slot and does not think that she needs a 30 min appt ans she states the doctor will not examine her sine she has already had a pap mammogram and bone density.  Do you see a place I can schedule her that is sooner? Thanks

## 2016-11-24 ENCOUNTER — Encounter: Payer: Self-pay | Admitting: *Deleted

## 2016-11-25 ENCOUNTER — Other Ambulatory Visit (INDEPENDENT_AMBULATORY_CARE_PROVIDER_SITE_OTHER): Payer: Medicare Other

## 2016-11-25 ENCOUNTER — Telehealth: Payer: Self-pay | Admitting: Family Medicine

## 2016-11-25 ENCOUNTER — Other Ambulatory Visit: Payer: Self-pay | Admitting: Family Medicine

## 2016-11-25 DIAGNOSIS — E039 Hypothyroidism, unspecified: Secondary | ICD-10-CM

## 2016-11-25 LAB — T4, FREE: Free T4: 0.65 ng/dL (ref 0.60–1.60)

## 2016-11-25 LAB — T3, FREE: T3, Free: 4.1 pg/mL (ref 2.3–4.2)

## 2016-11-25 LAB — TSH: TSH: 0.6 u[IU]/mL (ref 0.35–4.50)

## 2016-11-25 NOTE — Telephone Encounter (Signed)
-----   Message from Ellamae Sia sent at 11/25/2016 11:03 AM EDT ----- Regarding: Lab orders asap/ add orders??? Pt came in for Thyroid tests and wants everything done, she said it was discussed during [facility] appt and was all supposed to be done today. I drew extra blood for test you may want to add, T

## 2016-12-04 ENCOUNTER — Other Ambulatory Visit: Payer: Self-pay | Admitting: Family Medicine

## 2016-12-09 ENCOUNTER — Ambulatory Visit (INDEPENDENT_AMBULATORY_CARE_PROVIDER_SITE_OTHER): Payer: Medicare Other | Admitting: Family Medicine

## 2016-12-09 ENCOUNTER — Encounter: Payer: Self-pay | Admitting: *Deleted

## 2016-12-09 ENCOUNTER — Encounter: Payer: Self-pay | Admitting: Family Medicine

## 2016-12-09 VITALS — BP 128/68 | HR 95 | Temp 98.2°F | Ht 59.25 in | Wt 185.5 lb

## 2016-12-09 DIAGNOSIS — Z0000 Encounter for general adult medical examination without abnormal findings: Secondary | ICD-10-CM

## 2016-12-09 DIAGNOSIS — E7800 Pure hypercholesterolemia, unspecified: Secondary | ICD-10-CM | POA: Diagnosis not present

## 2016-12-09 DIAGNOSIS — R0609 Other forms of dyspnea: Secondary | ICD-10-CM | POA: Diagnosis not present

## 2016-12-09 DIAGNOSIS — J449 Chronic obstructive pulmonary disease, unspecified: Secondary | ICD-10-CM | POA: Diagnosis not present

## 2016-12-09 DIAGNOSIS — Z0001 Encounter for general adult medical examination with abnormal findings: Secondary | ICD-10-CM

## 2016-12-09 DIAGNOSIS — I10 Essential (primary) hypertension: Secondary | ICD-10-CM | POA: Diagnosis not present

## 2016-12-09 DIAGNOSIS — E039 Hypothyroidism, unspecified: Secondary | ICD-10-CM

## 2016-12-09 DIAGNOSIS — F321 Major depressive disorder, single episode, moderate: Secondary | ICD-10-CM | POA: Diagnosis not present

## 2016-12-09 DIAGNOSIS — M8589 Other specified disorders of bone density and structure, multiple sites: Secondary | ICD-10-CM | POA: Diagnosis not present

## 2016-12-09 DIAGNOSIS — R5383 Other fatigue: Secondary | ICD-10-CM

## 2016-12-09 DIAGNOSIS — E559 Vitamin D deficiency, unspecified: Secondary | ICD-10-CM

## 2016-12-09 DIAGNOSIS — R0600 Dyspnea, unspecified: Secondary | ICD-10-CM

## 2016-12-09 DIAGNOSIS — E538 Deficiency of other specified B group vitamins: Secondary | ICD-10-CM

## 2016-12-09 LAB — CBC WITH DIFFERENTIAL/PLATELET
Basophils Absolute: 0.1 10*3/uL (ref 0.0–0.1)
Basophils Relative: 0.9 % (ref 0.0–3.0)
Eosinophils Absolute: 0.1 10*3/uL (ref 0.0–0.7)
Eosinophils Relative: 1.4 % (ref 0.0–5.0)
HCT: 43.7 % (ref 36.0–46.0)
Hemoglobin: 14.8 g/dL (ref 12.0–15.0)
Lymphocytes Relative: 31.4 % (ref 12.0–46.0)
Lymphs Abs: 2.7 10*3/uL (ref 0.7–4.0)
MCHC: 33.9 g/dL (ref 30.0–36.0)
MCV: 86.5 fl (ref 78.0–100.0)
Monocytes Absolute: 0.7 10*3/uL (ref 0.1–1.0)
Monocytes Relative: 8.6 % (ref 3.0–12.0)
Neutro Abs: 4.9 10*3/uL (ref 1.4–7.7)
Neutrophils Relative %: 57.7 % (ref 43.0–77.0)
Platelets: 422 10*3/uL — ABNORMAL HIGH (ref 150.0–400.0)
RBC: 5.05 Mil/uL (ref 3.87–5.11)
RDW: 14.7 % (ref 11.5–15.5)
WBC: 8.5 10*3/uL (ref 4.0–10.5)

## 2016-12-09 LAB — VITAMIN B12: Vitamin B-12: 821 pg/mL (ref 211–911)

## 2016-12-09 LAB — VITAMIN D 25 HYDROXY (VIT D DEFICIENCY, FRACTURES): VITD: 41.71 ng/mL (ref 30.00–100.00)

## 2016-12-09 NOTE — Assessment & Plan Note (Signed)
Well controlled. Continue current medication.  

## 2016-12-09 NOTE — Assessment & Plan Note (Signed)
Likely multipfactorial. At least in part to deconditioning and obesity.  Spirometry today shows no COPD.

## 2016-12-09 NOTE — Assessment & Plan Note (Addendum)
Inadequate control,but better on zetia. Pt refuses statin despite risk.

## 2016-12-09 NOTE — Assessment & Plan Note (Signed)
Moderate control on no med. She does report some anxiety but seems more situational.

## 2016-12-09 NOTE — Progress Notes (Signed)
Subjective:    Patient ID: Tiffany Orozco, female    DOB: [birth year], 66 y.o.   MRN: [id number]  HPI   The patient presents for annual medicare wellness, complete physical and review of chronic health problems. He/She also has the following acute concerns today: she is fatigued, both hips and knees hurt.  The patient saw Candis Musa, LPN for medicare wellness. Note reviewed in detail and important notes copied below. Health maintenance: No gaps identified. Abnormal screenings:  Hearing - failed Mini-Cog score: 19/20   TODAY: She sees Dr. Kennon Rounds for GYN exam.. Last OV 11/02/2016 Last pap 2013 - normal with negative HPV  GI issues: ON FODMAP diet, changed to dexilant  Depression, major moderate:  Stable control.. On no medication.  PHQ2 0 on 11/03/2016  Hypertension: Good control on HCTZ prn and amlodipine 5 mg daily BP Readings from Last 3 Encounters:  12/09/16 128/68  11/02/16 133/87  09/02/16 140/86  Using medication without problems or lightheadedness:  none Chest pain with exertion:none Edema:none Short of breath: with exertion Average home BPs: good control Other issues:  Hypothyroid: On armour thyroid   Lab Results  Component Value Date   TSH 0.60 11/25/2016   Elevated Cholesterol: Inadequate control.. On zetia.  Hx of CVA onaggrenox. Lab Results  Component Value Date   CHOL 159 09/02/2016   HDL 35.50 (L) 09/02/2016   LDLCALC 140 (H) 04/26/2016   LDLDIRECT 103.0 09/02/2016   TRIG 220.0 (H) 09/02/2016   CHOLHDL 4 09/02/2016  Using medications without problems: none Muscle aches:  none Diet compliance: good control Exercise: none Other complaints:   COPD, likely.. But no clear spirometry to prove. Tobacco abuse > 30 years, current  She is short of breath and coughing in mornings. Has tried allergies medicine.  She is still smoking. Pre-contemplative for cessation.    Wt Readings from Last 3 Encounters:  12/09/16 185 lb 8 oz (84.1 kg)  11/02/16  182 lb (82.6 kg)  09/02/16 179 lb 12 oz (81.5 kg)     Social History /Family History/Past Medical History reviewed in detail and updated in EMR if needed. Blood pressure 128/68, pulse 95, temperature 98.2 F (36.8 C), temperature source Oral, height 4' 11.25" (1.505 m), weight 185 lb 8 oz (84.1 kg).  Review of Systems  Constitutional: Positive for fatigue. Negative for fever.  HENT: Negative for ear pain.   Eyes: Negative for pain.  Respiratory: Positive for cough and shortness of breath. Negative for wheezing.   Cardiovascular: Negative for chest pain, palpitations and leg swelling.  Gastrointestinal: Negative for abdominal distention.  Genitourinary: Negative for dysuria.  Musculoskeletal: Positive for arthralgias and back pain.  Neurological: Negative for dizziness and light-headedness.  Psychiatric/Behavioral: Positive for agitation.       Objective:   Physical Exam  Constitutional: Vital signs are normal. She appears well-developed and well-nourished. She is cooperative.  Non-toxic appearance. She does not appear ill. No distress.  Obese female in NAD  HENT:  Head: Normocephalic.  Right Ear: Hearing, tympanic membrane, external ear and ear canal normal.  Left Ear: Hearing, tympanic membrane, external ear and ear canal normal.  Nose: Nose normal.  Eyes: Pupils are equal, round, and reactive to light. Conjunctivae, EOM and lids are normal. Lids are everted and swept, no foreign bodies found.  Neck: Trachea normal and normal range of motion. Neck supple. Carotid bruit is not present. No thyroid mass and no thyromegaly present.  Cardiovascular: Normal rate, regular rhythm, S1 normal, S2 normal, normal  heart sounds and intact distal pulses.  Exam reveals no gallop.   No murmur heard. Pulmonary/Chest: Effort normal and breath sounds normal. No respiratory distress. She has no wheezes. She has no rhonchi. She has no rales.  Abdominal: Soft. Normal appearance and bowel sounds are  normal. She exhibits no distension, no fluid wave, no abdominal bruit and no mass. There is no hepatosplenomegaly. There is no tenderness. There is no rebound, no guarding and no CVA tenderness. No hernia.  Lymphadenopathy:    She has no cervical adenopathy.    She has no axillary adenopathy.  Neurological: She is alert. She has normal strength. No cranial nerve deficit or sensory deficit.  Skin: Skin is warm, dry and intact. No rash noted.  Psychiatric: Her speech is normal and behavior is normal. Judgment normal. Her mood appears not anxious. Cognition and memory are normal. She does not exhibit a depressed mood.          Assessment & Plan:  The patient's preventative maintenance and recommended screening tests for an annual wellness exam were reviewed in full today. Brought up to date unless services declined.  Counselled on the importance of diet, exercise, and its role in overall health and mortality. The patient's FH and SH was reviewed, including their home life, tobacco status, and drug and alcohol status.   Vaccines: Uptodate  PAP/DVE:nml in 2013, followed by GYN.  No current symptoms of prolapsed uterus Mammo: nml 07/2016 per GYN DXA: osteopenia 07/2016.. no significant change plan repeat in 2023 Colon: 04/2010, Dr. [name], nml, repeat in 10 years  Smoker:precontemplative >30 pack year history,  [place] lung cancer screening program.last CT chest 10/29/2016 nml EKG in 07/2013.

## 2016-12-09 NOTE — Patient Instructions (Addendum)
Quit smoking.  Start regular exercise for energy.  Please stop at the lab to have labs drawn.

## 2016-12-09 NOTE — Assessment & Plan Note (Signed)
Eval with labs including B12, Vit D given hx of deficiency and poor diet as well as cbc.   Nml thyroid.

## 2016-12-20 ENCOUNTER — Other Ambulatory Visit: Payer: Self-pay

## 2017-02-04 ENCOUNTER — Other Ambulatory Visit: Payer: Self-pay | Admitting: Family Medicine

## 2017-02-04 MED ORDER — FLUTICASONE-SALMETEROL 250-50 MCG/DOSE IN AEPB: 1.0000 | INHALATION_SPRAY | Freq: Two times a day (BID) | RESPIRATORY_TRACT | 11 refills | Status: DC

## 2017-02-04 NOTE — Addendum Note (Signed)
Addended by: Carter Kitten on: 02/04/2017 12:48 PM   Modules accepted: Orders

## 2017-02-04 NOTE — Telephone Encounter (Signed)
Last office visit 12/09/2016.  Ventolin Inhaler is not on current medication list.  Refill?

## 2017-02-04 NOTE — Telephone Encounter (Signed)
Okay to refill for advair as well for 1 year

## 2017-02-04 NOTE — Telephone Encounter (Signed)
Please also see note about Advair.

## 2017-02-04 NOTE — Telephone Encounter (Signed)
We also received a fax from [facility] stating patient is requesting Rx for Advair.  Not on medication list either.

## 2017-03-03 ENCOUNTER — Ambulatory Visit (INDEPENDENT_AMBULATORY_CARE_PROVIDER_SITE_OTHER): Payer: Medicare Other

## 2017-03-03 DIAGNOSIS — Z23 Encounter for immunization: Secondary | ICD-10-CM | POA: Diagnosis not present

## 2017-03-22 ENCOUNTER — Other Ambulatory Visit: Payer: Self-pay | Admitting: Family Medicine

## 2017-05-01 IMAGING — CR DG CHEST 1V PORT
1 series · 1 of 1 positions shown · non-contrast
Comparison: Chest radiograph 01/25/2015.

CLINICAL DATA: Patient with cough and shortness of breath.

EXAM:
PORTABLE CHEST 1 VIEW

[AP]
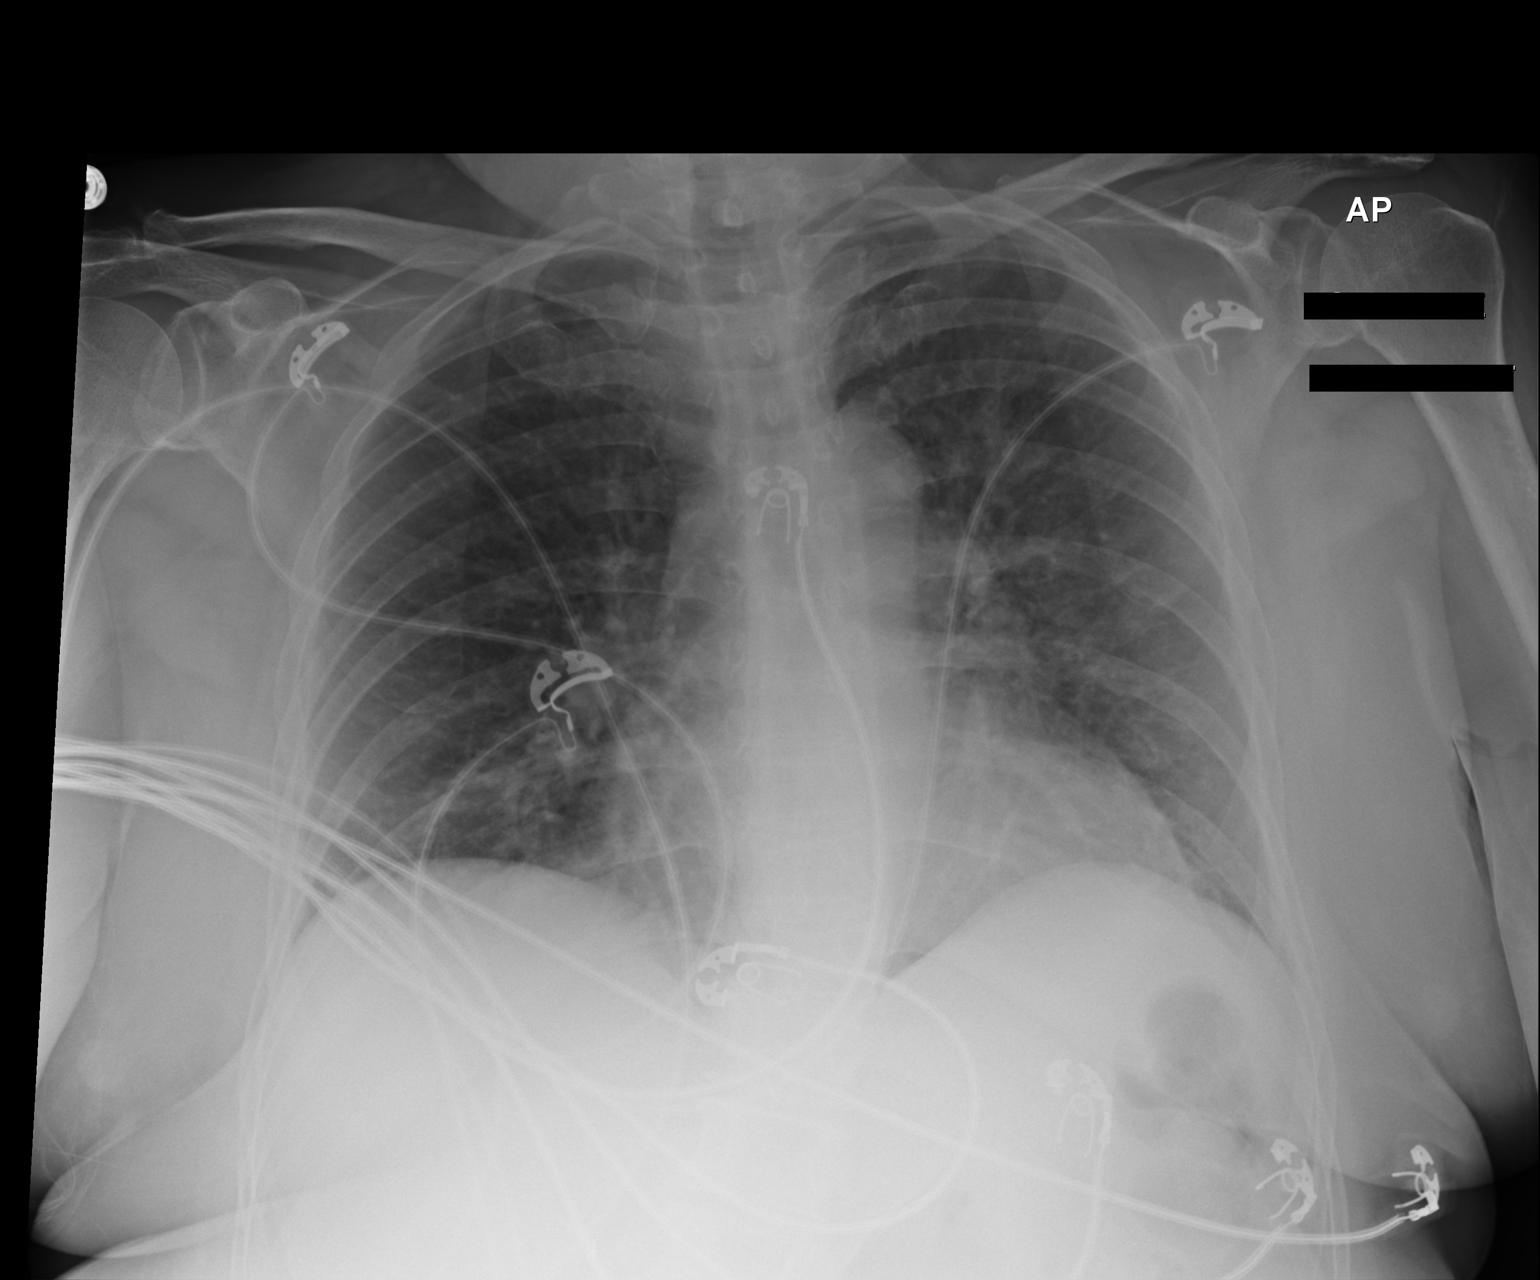

[1 of 1 positions shown; findings below may reference images not displayed]

FINDINGS: Monitoring leads overlie the patient. Stable cardiac and mediastinal
contours. Minimal heterogeneous opacities within the lung bases
bilaterally. No pleural effusion or pneumothorax.
IMPRESSION: Bibasilar opacities favored to represent atelectasis.

## 2017-05-03 IMAGING — CR DG CHEST 1V PORT
1 series · 1 of 1 positions shown · non-contrast
Comparison: 06/08/2015

CLINICAL DATA: Acute respiratory failure with hypoxia

EXAM:
PORTABLE CHEST - 1 VIEW

[AP]
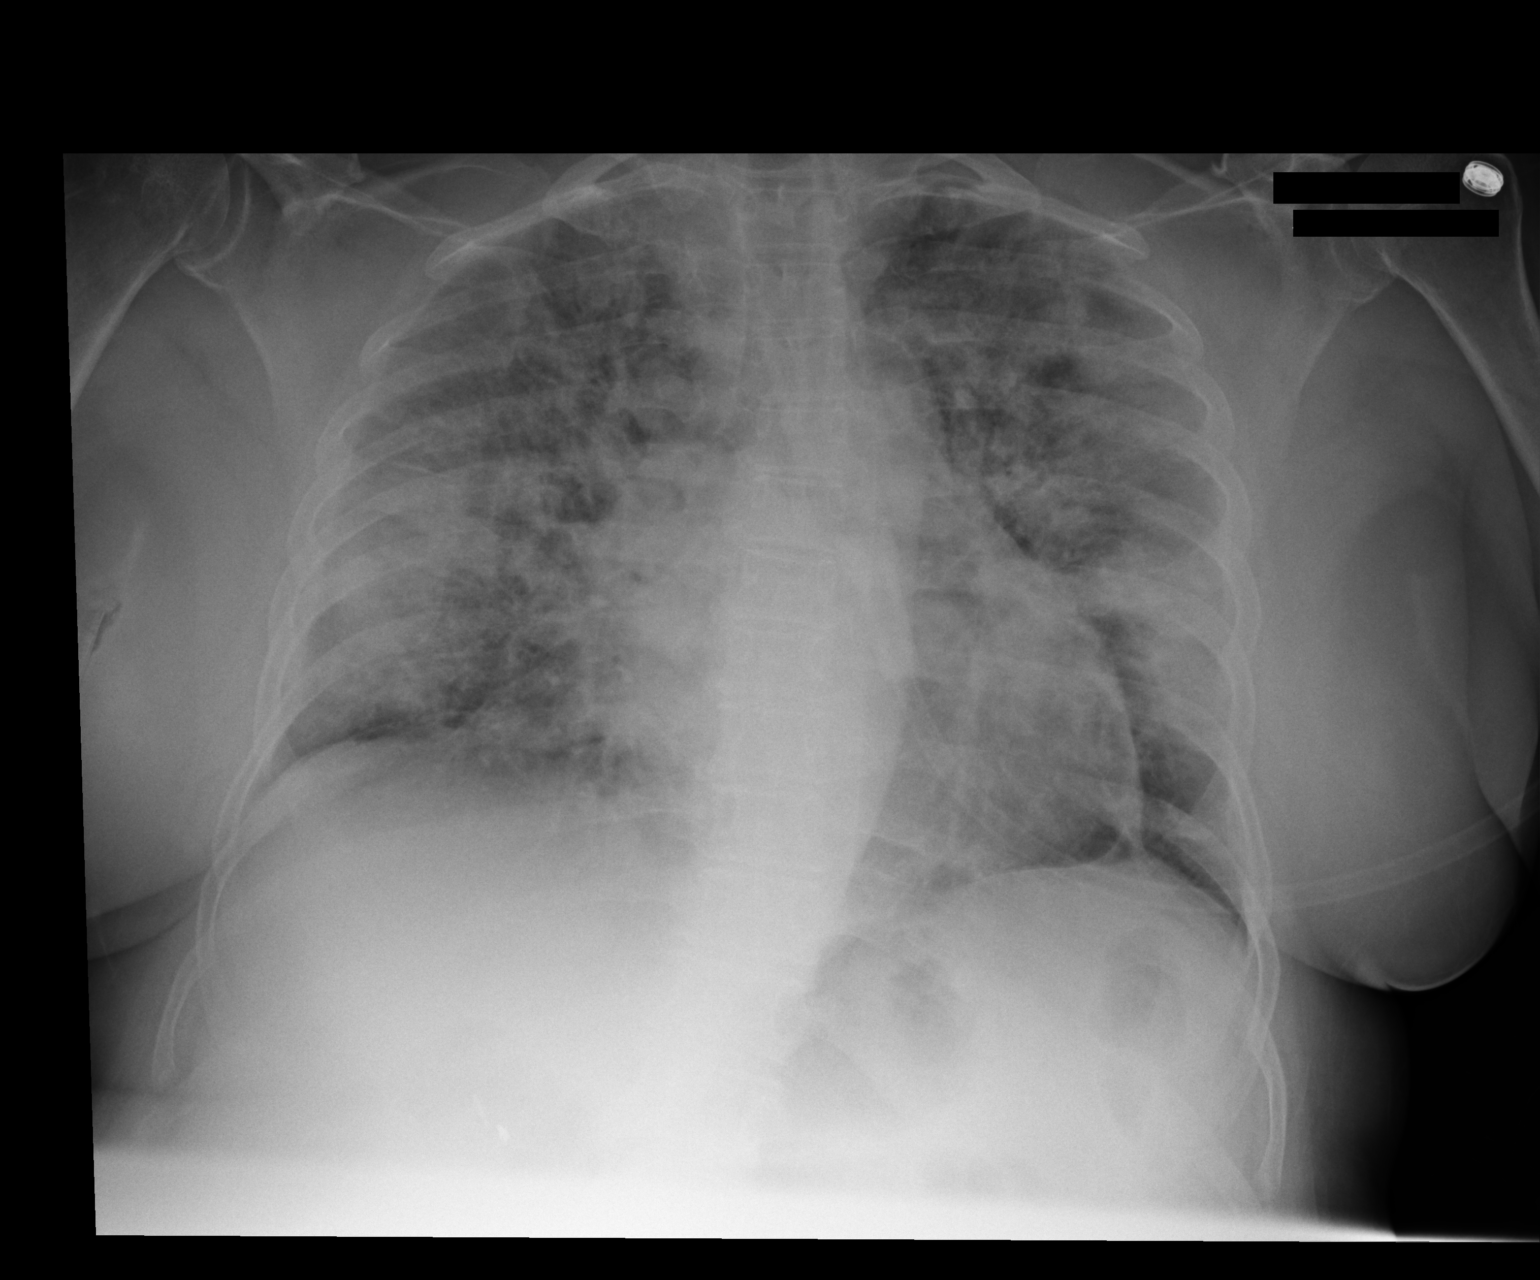

[1 of 1 positions shown; findings below may reference images not displayed]

FINDINGS: Cardiac shadow is stable. Diffuse bilateral infiltrates are now seen
new from the prior exam. This likely represents congestive failure
and pulmonary edema. No sizable effusion is noted. No bony
abnormality is seen.
IMPRESSION: Diffuse bilateral infiltrates likely related to a pulmonary edema
and congestive failure.

## 2017-05-10 ENCOUNTER — Ambulatory Visit: Payer: Medicare Other | Admitting: Family Medicine

## 2017-05-11 ENCOUNTER — Ambulatory Visit (INDEPENDENT_AMBULATORY_CARE_PROVIDER_SITE_OTHER): Payer: Medicare Other | Admitting: Family Medicine

## 2017-05-11 ENCOUNTER — Encounter: Payer: Self-pay | Admitting: Family Medicine

## 2017-05-11 VITALS — BP 124/74 | HR 80 | Temp 98.6°F | Ht 59.25 in | Wt 177.5 lb

## 2017-05-11 DIAGNOSIS — J069 Acute upper respiratory infection, unspecified: Secondary | ICD-10-CM | POA: Diagnosis not present

## 2017-05-11 MED ORDER — DOXYCYCLINE HYCLATE 100 MG PO TABS: 100.0000 mg | ORAL_TABLET | Freq: Two times a day (BID) | ORAL | 0 refills | Status: AC

## 2017-05-11 NOTE — Progress Notes (Signed)
Dr. Frederico Hamman T. Gad Aymond, MD, [facility] [place], [address] Phone: [phone] Fax: [phone]  05/11/2017  Patient: Tiffany Orozco, MRN: [id number], DOB: 1950/09/14, 66 y.o.  Primary Physician:  Jinny Sanders, MD   Chief Complaint  Patient presents with  . Cough    productive/clear, symptoms started Tuesday   Subjective:   Tiffany Orozco is a 66 y.o. very pleasant female patient who presents with the following:  Has some allergy / sinus symptoms. Neighbors were burning leaves. Smokes < 1 PPD, half or less.  Sometimes uses albuterol with using the pollen.  Very pleasant lady, she does have a history of significant smoking.  She does not use any routine, regular inhalers, but she does use an occasional albuterol.  She has an 8-day history of some coughing, production of sputum, and some upper respiratory symptoms.  Past Medical History, Surgical History, Social History, Family History, Problem List, Medications, and Allergies have been reviewed and updated if relevant.  Patient Active Problem List   Diagnosis Date Noted  . Esophagitis 09/02/2016  . PUD (peptic ulcer disease) 09/02/2016  . Coronary atherosclerosis of native coronary artery 05/04/2016  . Tobacco abuse 06/09/2015  . History of CVA (cerebrovascular accident) 06/08/2015  . IBS (irritable bowel syndrome) 06/08/2015  . Chronic nausea 01/17/2015  . Hyponatremia 10/15/2014  . Chronic back pain 07/12/2014  . Dysphagia 04/19/2014  . Fatigue 09/12/2012  . HTN (hypertension) 11/30/2011  . Herniation of rectum into vagina 08/29/2011  . Thrombocytosis ([place]) 06/08/2011  . Dyspnea 03/23/2011  . CVA (cerebrovascular accident) ([place]) 09/29/2010  . CONSTIPATION, CHRONIC 03/19/2010  . Osteopenia 03/19/2010  . MURMUR 03/20/2009  . UNSPECIFIED CARDIAC DYSRHYTHMIA 03/06/2009  . Allergic rhinitis 08/14/2007  . Hypothyroidism 02/27/2007  .  HYPERCHOLESTEROLEMIA 02/27/2007  . Generalized anxiety disorder 02/27/2007  . Depression, major, single episode, moderate ([place]) 02/27/2007  . GERD 02/27/2007    Past Medical History:  Diagnosis Date  . Allergy   . Anxiety   . COPD (chronic obstructive pulmonary disease) ([place])   . DDD (degenerative disc disease), lumbar    mild  . Depression   . Female rectocele without uterine prolapse   . GERD (gastroesophageal reflux disease)   . Heart murmur   . Hyperlipidemia   . Hypertension   . Hypothyroidism   . IBS (irritable bowel syndrome)   . Influenza A 06/2015  . Obesity   . Osteopenia   . Skin cancer of arm   . Skin cancer of face   . Stroke ([place]) 07/2010   no permenant deficits  . Thrombocytopenia ([place])     Past Surgical History:  Procedure Laterality Date  . APPENDECTOMY    . CHOLECYSTECTOMY  2004  . EMGs  7/04   atms and wrists neg  . ESOPHAGEAL MANOMETRY N/A 03/17/2015   Procedure: ESOPHAGEAL MANOMETRY (EM);  Surgeon: Manus Gunning, MD;  Location: [facility];  Service: Gastroenterology;  Laterality: N/A;  . FOOT SURGERY  03/31/2009   R foot  . FOOT SURGERY  2008, 2009, 2010   right foot   . TUBAL LIGATION  1975    Social History   Socioeconomic History  . Marital status: Married    Spouse name: Chanta Bauers  . Number of children: 2  . Years of education: Not on file  . Highest education level: Not on file  Social Needs  . Financial resource strain: Not on file  . Food  insecurity - worry: Not on file  . Food insecurity - inability: Not on file  . Transportation needs - medical: Not on file  . Transportation needs - non-medical: Not on file  Occupational History  . Occupation: bookkeeper    Fish farm manager: UNEMPLOYED    Comment: 10/10 not working x 9 years  Tobacco Use  . Smoking status: Current Every Day Smoker    Packs/day: 1.00    Years: 31.00    Pack years: 31.00    Types: Cigarettes  . Smokeless tobacco: Never Used  . Tobacco comment: May  try when husband finished treatment for prostate cancer  Substance and Sexual Activity  . Alcohol use: No    Alcohol/week: 0.0 oz  . Drug use: No  . Sexual activity: Yes    Birth control/protection: None  Other Topics Concern  . Not on file  Social History Narrative   5/10 husband had open heart surgery, not working, 2 grandchildren, siblings living and healthy    Family History  Problem Relation Age of Onset  . Skin cancer Father   . Hypertension Mother   . Cancer Brother        lung  . Cancer Sister        colon  . Cancer Sister        ovarian and esophageal    Allergies  Allergen Reactions  . Lisinopril     Shortness of breath.  . Niacin     REACTION: dizziness  . Welchol [Colesevelam Hcl] Other (See Comments)    Diarrhea,Nausea, swelling in face  . Augmentin [Amoxicillin-Pot Clavulanate]     Nauseated. GI upset with taking.  . Codeine     keeps patient awake  . Oxycodone Hcl     Patient states this medication makes her hyper    Medication list reviewed and updated in full in [place].  ROS: GEN: Acute illness details above GI: Tolerating PO intake GU: maintaining adequate hydration and urination Pulm: No SOB Interactive and getting along well at home.  Otherwise, ROS is as per the HPI.  Objective:   BP 124/74   Pulse 80   Temp 98.6 F (37 C) (Oral)   Ht 4' 11.25" (1.505 m)   Wt 177 lb 8 oz (80.5 kg)   SpO2 97%   BMI 35.55 kg/m    GEN: A and O x 3. WDWN. NAD.    ENT: Nose clear, ext NML.  No LAD.  No JVD.  TM's clear. Oropharynx clear.  PULM: Normal WOB, no distress. No crackles, wheezes, rhonchi. CV: RRR, no M/G/R, No rubs, No JVD.   EXT: warm and well-perfused, No c/c/e. PSYCH: Pleasant and conversant.    Laboratory and Imaging Data:  Assessment and Plan:   URI, acute  Likely URI.  Supportive care.  I gave her a written prescription for some antibiotics because she worsens over the next few days and a smoker.  Continue with  Flonase as well as as needed albuterol.  Follow-up: No Follow-up on file.  Future Appointments  Date Time Provider [place]  06/10/2017  9:45 AM Jinny Sanders, MD [facility]  12/14/2017  9:45 AM Barnetta Hammersmith, Venetia Maxon, LPN [facility]  5/32/9924 10:00 AM Jinny Sanders, MD [facility]    Meds ordered this encounter  Medications  . doxycycline (VIBRA-TABS) 100 MG tablet    Sig: Take 1 tablet (100 mg total) by mouth 2 (two) times daily for 10 days.    Dispense:  20 tablet    Refill:  0   Medications Discontinued During This Encounter  Medication Reason  . Fluticasone-Salmeterol (ADVAIR DISKUS) 250-50 MCG/DOSE AEPB Error    Signed,  Olusegun Gerstenberger T. Ilario Dhaliwal, MD   Allergies as of 05/11/2017      Reactions   Lisinopril    Shortness of breath.   Niacin    REACTION: dizziness   Welchol [colesevelam Hcl] Other (See Comments)   Diarrhea,Nausea, swelling in face   Augmentin [amoxicillin-pot Clavulanate]    Nauseated. GI upset with taking.   Codeine    keeps patient awake   Oxycodone Hcl    Patient states this medication makes her hyper      Medication List        Accurate as of 05/11/17 10:38 AM. Always use your most recent med list.          albuterol 108 (90 Base) MCG/ACT inhaler Commonly known as:  PROVENTIL HFA;VENTOLIN HFA Inhale 2 puffs into the lungs every 6 (six) hours as needed for wheezing or shortness of breath.   amLODipine 5 MG tablet Commonly known as:  NORVASC Take 1 tablet (5 mg total) by mouth daily.   ARMOUR THYROID 90 MG tablet Generic drug:  thyroid Take 1 tablet (90 mg total) by mouth daily.   COQ10 PO Take 1 capsule by mouth daily.   dexlansoprazole 60 MG capsule Commonly known as:  DEXILANT Take 1 capsule (60 mg total) by mouth daily.   dipyridamole-aspirin 200-25 MG 12hr capsule Commonly known as:  AGGRENOX TAKE 1 CAPSULE BY MOUTH 2 (TWO) TIMES DAILY.   doxycycline 100 MG tablet Commonly known as:  VIBRA-TABS Take 1 tablet  (100 mg total) by mouth 2 (two) times daily for 10 days.   ezetimibe 10 MG tablet Commonly known as:  ZETIA TAKE 1 TABLET (10 MG TOTAL) BY MOUTH DAILY.   fluticasone 50 MCG/ACT nasal spray Commonly known as:  FLONASE Place 2 sprays into both nostrils daily.   hydrochlorothiazide 25 MG tablet Commonly known as:  HYDRODIURIL 1 tab po daily prn swelling   KELP PO Take 600 mg by mouth daily.   ondansetron 8 MG disintegrating tablet Commonly known as:  ZOFRAN-ODT   polyethylene glycol powder powder Commonly known as:  GLYCOLAX/MIRALAX MIX 1 CAPFUL (17 GRAMS) IN 8 OUNCES OF LIQUID AND DRINK DAILY AS NEEDED FOR CONSTIPATION.   Vitamin D3 2000 units Tabs Take 1 tablet by mouth daily.

## 2017-05-17 ENCOUNTER — Ambulatory Visit: Payer: Self-pay | Admitting: *Deleted

## 2017-05-17 NOTE — Telephone Encounter (Signed)
Pt seen in office on 12/12 for cough. Placed on antibiotics, started 12/12 will complete course 12/22.  States "no better." No fever.  Advised to finish antibiotics and home care advice per protocol given. Pt voices understanding.  Reason for Disposition . Cough  Answer Assessment - Initial Assessment Questions 1. ONSET: "When did the cough begin?"     12/6 2. SEVERITY: "How bad is the cough today?"      Worse at night, moderate 3. RESPIRATORY DISTRESS: "Describe your breathing."      Mild shortness of breath with exertion 4. FEVER: "Do you have a fever?" If so, ask: "What is your temperature, how was it measured, and when did it start?"     96.0 this AM, baseline temp 97.6 5. SPUTUM: "Describe the color of your sputum" (clear, white, yellow, green)     Thick, clear, intermittent 6. HEMOPTYSIS: "Are you coughing up any blood?" If so ask: "How much?" (flecks, streaks, tablespoons, etc.)     Flecks of "blood" with heavy cough 7. CARDIAC HISTORY: "Do you have any history of heart disease?" (e.g., heart attack, congestive heart failure)      Unsure 8. LUNG HISTORY: "Do you have any history of lung disease?"  (e.g., pulmonary embolus, asthma, emphysema)     Chronic bronchitis, last episode 2 years ago. 9. PE RISK FACTORS: "Do you have a history of blood clots?" (or: recent major surgery, recent prolonged travel, bedridden )     No 10. OTHER SYMPTOMS: "Do you have any other symptoms?" (e.g., runny nose, wheezing, chest pain)       Pain in back, between shoulder blades with strong cough. 6/10 11. PREGNANCY: "Is there any chance you are pregnant?" "When was your last menstrual period?"       n/a 12. TRAVEL: "Have you traveled out of the country in the last month?" (e.g., travel history, exposures)       No  Protocols used: [place]

## 2017-05-18 ENCOUNTER — Other Ambulatory Visit: Payer: Self-pay | Admitting: Family Medicine

## 2017-06-06 ENCOUNTER — Ambulatory Visit (INDEPENDENT_AMBULATORY_CARE_PROVIDER_SITE_OTHER): Payer: Medicare Other | Admitting: Family Medicine

## 2017-06-06 ENCOUNTER — Encounter: Payer: Self-pay | Admitting: Family Medicine

## 2017-06-06 VITALS — BP 124/60 | HR 81 | Temp 97.3°F | Wt 179.5 lb

## 2017-06-06 DIAGNOSIS — E039 Hypothyroidism, unspecified: Secondary | ICD-10-CM | POA: Diagnosis not present

## 2017-06-06 DIAGNOSIS — M940 Chondrocostal junction syndrome [Tietze]: Secondary | ICD-10-CM | POA: Diagnosis not present

## 2017-06-06 DIAGNOSIS — R5383 Other fatigue: Secondary | ICD-10-CM | POA: Diagnosis not present

## 2017-06-06 LAB — CBC WITH DIFFERENTIAL/PLATELET
Basophils Absolute: 0.1 10*3/uL (ref 0.0–0.1)
Basophils Relative: 0.9 % (ref 0.0–3.0)
Eosinophils Absolute: 0.1 10*3/uL (ref 0.0–0.7)
Eosinophils Relative: 1.7 % (ref 0.0–5.0)
HCT: 43.1 % (ref 36.0–46.0)
Hemoglobin: 14.1 g/dL (ref 12.0–15.0)
Lymphocytes Relative: 30.5 % (ref 12.0–46.0)
Lymphs Abs: 2.2 10*3/uL (ref 0.7–4.0)
MCHC: 32.8 g/dL (ref 30.0–36.0)
MCV: 88.3 fl (ref 78.0–100.0)
Monocytes Absolute: 0.6 10*3/uL (ref 0.1–1.0)
Monocytes Relative: 8.6 % (ref 3.0–12.0)
Neutro Abs: 4.1 10*3/uL (ref 1.4–7.7)
Neutrophils Relative %: 58.3 % (ref 43.0–77.0)
Platelets: 352 10*3/uL (ref 150.0–400.0)
RBC: 4.88 Mil/uL (ref 3.87–5.11)
RDW: 16.1 % — ABNORMAL HIGH (ref 11.5–15.5)
WBC: 7.1 10*3/uL (ref 4.0–10.5)

## 2017-06-06 LAB — TSH: TSH: 0.83 u[IU]/mL (ref 0.35–4.50)

## 2017-06-06 NOTE — Patient Instructions (Addendum)
Please take 1-2 Alleve every 12 hours if your stomach can tolerate Can also apply heat to affected area Follow up with Dr. Diona Browner  Costochondritis Costochondritis is swelling and irritation (inflammation) of the tissue (cartilage) that connects your ribs to your breastbone (sternum). This causes pain in the front of your chest. Usually, the pain:  Starts gradually.  Is in more than one rib.  This condition usually goes away on its own over time. Follow these instructions at home:  Do not do anything that makes your pain worse.  If directed, put ice on the painful area: ? Put ice in a plastic bag. ? Place a towel between your skin and the bag. ? Leave the ice on for 20 minutes, 2-3 times a day.  If directed, put heat on the affected area as often as told by your doctor. Use the heat source that your doctor tells you to use, such as a moist heat pack or a heating pad. ? Place a towel between your skin and the heat source. ? Leave the heat on for 20-30 minutes. ? Take off the heat if your skin turns bright red. This is very important if you cannot feel pain, heat, or cold. You may have a greater risk of getting burned.  Take over-the-counter and prescription medicines only as told by your doctor.  Return to your normal activities as told by your doctor. Ask your doctor what activities are safe for you.  Keep all follow-up visits as told by your doctor. This is important. Contact a doctor if:  You have chills or a fever.  Your pain does not go away or it gets worse.  You have a cough that does not go away. Get help right away if:  You are short of breath. This information is not intended to replace advice given to you by your health care provider. Make sure you discuss any questions you have with your health care provider. Document Released: 11/03/2007 Document Revised: 12/05/2015 Document Reviewed: 09/10/2015 Elsevier Interactive Patient Education  [employer].

## 2017-06-06 NOTE — Progress Notes (Signed)
Subjective:    Patient ID: Tiffany Orozco, female    DOB: [birth year], 67 y.o.   MRN: [id number]  HPI This is a 67 yo female who presents today with continued nasal congestion, cough, fluid in ears. Chest feels sore from coughing.  Was seen 05/11/17 and was given doxycycline 100 mg x 10 days. Congestion improved.  Has had nausea x 1-2 weeks, has had this off and on for several years. Takes dexilant daily. Avoids triggers. Some relief with ondansetron. Has had 3-4 episodes of diarrhea every day, watery. No diarrhea today. Some cramping abdominal pain, some relief with BM.   Past Medical History:  Diagnosis Date  . Allergy   . Anxiety   . COPD (chronic obstructive pulmonary disease) ([place])   . DDD (degenerative disc disease), lumbar    mild  . Depression   . Female rectocele without uterine prolapse   . GERD (gastroesophageal reflux disease)   . Heart murmur   . Hyperlipidemia   . Hypertension   . Hypothyroidism   . IBS (irritable bowel syndrome)   . Influenza A 06/2015  . Obesity   . Osteopenia   . Skin cancer of arm   . Skin cancer of face   . Stroke ([place]) 07/2010   no permenant deficits  . Thrombocytopenia ([place])    Past Surgical History:  Procedure Laterality Date  . APPENDECTOMY    . CHOLECYSTECTOMY  2004  . EMGs  7/04   atms and wrists neg  . ESOPHAGEAL MANOMETRY N/A 03/17/2015   Procedure: ESOPHAGEAL MANOMETRY (EM);  Surgeon: Manus Gunning, MD;  Location: [facility];  Service: Gastroenterology;  Laterality: N/A;  . FOOT SURGERY  03/31/2009   R foot  . FOOT SURGERY  2008, 2009, 2010   right foot   . TUBAL LIGATION  1975   Family History  Problem Relation Age of Onset  . Skin cancer Father   . Hypertension Mother   . Cancer Brother        lung  . Cancer Sister        colon  . Cancer Sister        ovarian and esophageal   Social History   Tobacco Use  . Smoking status: Current Every Day Smoker    Packs/day: 1.00    Years: 31.00    Pack years:  31.00    Types: Cigarettes  . Smokeless tobacco: Never Used  . Tobacco comment: May try when husband finished treatment for prostate cancer  Substance Use Topics  . Alcohol use: No    Alcohol/week: 0.0 oz  . Drug use: No      Review of Systems Per HPI    Objective:   Physical Exam  Constitutional: She is oriented to person, place, and time. She appears well-developed and well-nourished. No distress.  HENT:  Head: Normocephalic and atraumatic.  Mouth/Throat: Oropharynx is clear and moist.  Eyes: Conjunctivae are normal.  Neck: Normal range of motion. Neck supple.  Cardiovascular: Normal rate, regular rhythm and normal heart sounds.  Pulmonary/Chest: Effort normal and breath sounds normal. She exhibits tenderness (over sternum and left medial rib cage).  Lymphadenopathy:    She has no cervical adenopathy.  Neurological: She is alert and oriented to person, place, and time.  Skin: Skin is warm and dry. She is not diaphoretic.  Psychiatric: She has a normal mood and affect. Her behavior is normal. Judgment and thought content normal.  Vitals reviewed.     BP 124/60 (  BP Location: Right Arm, Patient Position: Sitting, Cuff Size: Large)   Pulse 81   Temp (!) 97.3 F (36.3 C) (Oral)   Wt 179 lb 8 oz (81.4 kg)   SpO2 97%   BMI 35.95 kg/m  Wt Readings from Last 3 Encounters:  06/06/17 179 lb 8 oz (81.4 kg)  05/11/17 177 lb 8 oz (80.5 kg)  12/09/16 185 lb 8 oz (84.1 kg)       Assessment & Plan:  1. Costochondritis - Provided written and verbal information regarding diagnosis and treatment. - otc NSAIDs as tolerated  2. Other fatigue - CBC with Differential  3. Hypothyroidism, unspecified type - TSH  - follow up with Dr. Diona Browner as scheduled  Clarene Reamer, FNP-BC  [place] [facility], [place]  06/08/2017 4:47 PM

## 2017-06-07 ENCOUNTER — Other Ambulatory Visit: Payer: Self-pay | Admitting: Family Medicine

## 2017-06-10 ENCOUNTER — Encounter: Payer: Self-pay | Admitting: Family Medicine

## 2017-06-10 ENCOUNTER — Ambulatory Visit (INDEPENDENT_AMBULATORY_CARE_PROVIDER_SITE_OTHER): Payer: Medicare Other | Admitting: Family Medicine

## 2017-06-10 ENCOUNTER — Other Ambulatory Visit: Payer: Self-pay

## 2017-06-10 VITALS — BP 124/70 | HR 80 | Temp 98.2°F | Ht 59.25 in | Wt 178.0 lb

## 2017-06-10 DIAGNOSIS — E039 Hypothyroidism, unspecified: Secondary | ICD-10-CM

## 2017-06-10 DIAGNOSIS — R110 Nausea: Secondary | ICD-10-CM

## 2017-06-10 DIAGNOSIS — F321 Major depressive disorder, single episode, moderate: Secondary | ICD-10-CM | POA: Diagnosis not present

## 2017-06-10 DIAGNOSIS — I10 Essential (primary) hypertension: Secondary | ICD-10-CM

## 2017-06-10 MED ORDER — ONDANSETRON 8 MG PO TBDP: 8.0000 mg | ORAL_TABLET | Freq: Three times a day (TID) | ORAL | 2 refills | Status: DC | PRN

## 2017-06-10 MED ORDER — SERTRALINE HCL 50 MG PO TABS: 50.0000 mg | ORAL_TABLET | Freq: Every day | ORAL | 3 refills | Status: DC

## 2017-06-10 NOTE — Assessment & Plan Note (Signed)
Well controlled. Continue current medication.  

## 2017-06-10 NOTE — Patient Instructions (Addendum)
Stop ibuprofen as soon as able given GI issues.  Working exercies , weight loss and healthy eating.  Start back sertraline 50 mg daily at bedtime.. Make follow up in 4 weeks if mood is not improving or SE.

## 2017-06-10 NOTE — Progress Notes (Signed)
Subjective:    Patient ID: Tiffany Orozco, female    DOB: 12-21-1950, 67 y.o.   MRN: [id number]  HPI    67 year old female presents for 6 months follow up of multiple issues.  Major depression: Symptoms have started to worse over the winter since 02/2017.Marland Kitchen Decreased motivation.  Was on sertraline in past  And felt better.. More motivated.  She wishes to restart.   Also seen on 1/7 with  Nasal congestion, cough costochondritis...  Chest pain mproved now with ibuprofen. Congestion improving over time.  Hypothyroid:  At goal recent check on current dose Lab Results  Component Value Date   TSH 0.83 06/06/2017   Hypertension:    Good control on  HCTZ, amlodipine BP Readings from Last 3 Encounters:  06/10/17 124/70  06/06/17 124/60  05/11/17 124/74  Using medication without problems or lightheadedness:  none Chest pain with exertion: none Edema:none Short of breath: none Average home BPs: Other issues:  Chronic nausea due to GERD, constipation, hx esophagitis and PUD: followed by GI, need ondansetron refilled.  Blood pressure 124/70, pulse 80, temperature 98.2 F (36.8 C), temperature source Oral, height 4' 11.25" (1.505 m), weight 178 lb (80.7 kg). Social History /Family History/Past Medical History reviewed in detail and updated in EMR if needed.  Review of Systems  Constitutional: Negative for fatigue and fever.  HENT: Negative for congestion.   Eyes: Negative for pain.  Respiratory: Negative for cough and shortness of breath.   Cardiovascular: Positive for chest pain. Negative for palpitations and leg swelling.  Gastrointestinal: Negative for abdominal pain.  Genitourinary: Negative for dysuria and vaginal bleeding.  Musculoskeletal: Negative for back pain.  Neurological: Negative for syncope, light-headedness and headaches.  Psychiatric/Behavioral: Positive for dysphoric mood. Negative for self-injury and suicidal ideas. The patient is not nervous/anxious.       Objective:   Physical Exam  Constitutional: Vital signs are normal. She appears well-developed and well-nourished. She is cooperative.  Non-toxic appearance. She does not appear ill. No distress.  HENT:  Head: Normocephalic.  Right Ear: Hearing, tympanic membrane, external ear and ear canal normal. Tympanic membrane is not erythematous, not retracted and not bulging.  Left Ear: Hearing, tympanic membrane, external ear and ear canal normal. Tympanic membrane is not erythematous, not retracted and not bulging.  Nose: No mucosal edema or rhinorrhea. Right sinus exhibits no maxillary sinus tenderness and no frontal sinus tenderness. Left sinus exhibits no maxillary sinus tenderness and no frontal sinus tenderness.  Mouth/Throat: Uvula is midline, oropharynx is clear and moist and mucous membranes are normal.  Eyes: Conjunctivae, EOM and lids are normal. Pupils are equal, round, and reactive to light. Lids are everted and swept, no foreign bodies found.  Neck: Trachea normal and normal range of motion. Neck supple. Carotid bruit is not present. No thyroid mass and no thyromegaly present.  Cardiovascular: Normal rate, regular rhythm, S1 normal, S2 normal, normal heart sounds, intact distal pulses and normal pulses. Exam reveals no gallop and no friction rub.  No murmur heard. Pulmonary/Chest: Effort normal and breath sounds normal. No tachypnea. No respiratory distress. She has no decreased breath sounds. She has no wheezes. She has no rhonchi. She has no rales.  Abdominal: Soft. Normal appearance and bowel sounds are normal. There is no tenderness.  Neurological: She is alert.  Skin: Skin is warm, dry and intact. No rash noted.  Psychiatric: Her speech is normal and behavior is normal. Judgment and thought content normal. Her mood appears not  anxious. Cognition and memory are normal. She does not exhibit a depressed mood.          Assessment & Plan:

## 2017-06-10 NOTE — Assessment & Plan Note (Signed)
Limit NSAIDs as able now chest pain improving from costochondritis.  refilled ondansetron.

## 2017-06-10 NOTE — Assessment & Plan Note (Signed)
Restart sertraline and follow up if not improving. She did well on this med in , stopped only when she was ill.

## 2017-06-16 ENCOUNTER — Other Ambulatory Visit: Payer: Self-pay | Admitting: Family Medicine

## 2017-07-12 ENCOUNTER — Other Ambulatory Visit: Payer: Self-pay

## 2017-07-12 ENCOUNTER — Ambulatory Visit (INDEPENDENT_AMBULATORY_CARE_PROVIDER_SITE_OTHER): Payer: Medicare Other | Admitting: Family Medicine

## 2017-07-12 ENCOUNTER — Encounter: Payer: Self-pay | Admitting: Family Medicine

## 2017-07-12 DIAGNOSIS — J209 Acute bronchitis, unspecified: Secondary | ICD-10-CM

## 2017-07-12 MED ORDER — GUAIFENESIN-CODEINE 100-10 MG/5ML PO SYRP
5.0000 mL | ORAL_SOLUTION | Freq: Every evening | ORAL | 0 refills | Status: DC | PRN
Start: 2017-07-12 — End: 2017-12-14

## 2017-07-12 MED ORDER — AZITHROMYCIN 250 MG PO TABS: ORAL_TABLET | ORAL | 0 refills | Status: DC

## 2017-07-12 NOTE — Progress Notes (Signed)
   Subjective:    Patient ID: Tiffany Orozco, female    DOB: 05-Dec-1950, 68 y.o.   MRN: [id number]  Cough  This is a new problem. The current episode started in the past 7 days ( 5 days). The problem has been gradually worsening. The cough is productive of purulent sputum ( dark green). Associated symptoms include ear congestion, ear pain, a fever, headaches, nasal congestion, postnasal drip, a sore throat, shortness of breath and wheezing. Pertinent negatives include no rash. Associated symptoms comments: Left ear pain. The symptoms are aggravated by lying down. Risk factors for lung disease include smoking/tobacco exposure. Treatments tried: has not tried her albuterol inhaler. Her past medical history is significant for bronchitis and environmental allergies. There is no history of asthma.  Diarrhea   Associated symptoms include coughing, a fever, headaches and vomiting.  Emesis   Associated symptoms include coughing, diarrhea, a fever and headaches.  Fever   Associated symptoms include coughing, diarrhea, ear pain, headaches, a sore throat, vomiting and wheezing. Pertinent negatives include no rash.  Headache   Associated symptoms include coughing, ear pain, a fever, a sore throat and vomiting.   Blood pressure 140/66, pulse 85, temperature 98.3 F (36.8 C), temperature source Oral, height 4' 11.25" (1.505 m), weight 179 lb 4 oz (81.3 kg), SpO2 96 %. Low grade temp in last few days.   Review of Systems  Constitutional: Positive for fever.  HENT: Positive for ear pain, postnasal drip and sore throat.   Respiratory: Positive for cough, shortness of breath and wheezing.   Gastrointestinal: Positive for diarrhea and vomiting.  Skin: Negative for rash.  Allergic/Immunologic: Positive for environmental allergies.  Neurological: Positive for headaches.       Objective:   Physical Exam  Constitutional: Vital signs are normal. She appears well-developed and well-nourished. She is  cooperative.  Non-toxic appearance. She does not appear ill. No distress.  HENT:  Head: Normocephalic.  Right Ear: Hearing, tympanic membrane, external ear and ear canal normal. Tympanic membrane is not erythematous, not retracted and not bulging.  Left Ear: Hearing, tympanic membrane, external ear and ear canal normal. Tympanic membrane is not erythematous, not retracted and not bulging.  Nose: Mucosal edema and rhinorrhea present. Right sinus exhibits no maxillary sinus tenderness and no frontal sinus tenderness. Left sinus exhibits no maxillary sinus tenderness and no frontal sinus tenderness.  Mouth/Throat: Uvula is midline, oropharynx is clear and moist and mucous membranes are normal.  Eyes: Conjunctivae, EOM and lids are normal. Pupils are equal, round, and reactive to light. Lids are everted and swept, no foreign bodies found.  Neck: Trachea normal and normal range of motion. Neck supple. Carotid bruit is not present. No thyroid mass and no thyromegaly present.  Cardiovascular: Normal rate, regular rhythm, S1 normal, S2 normal, normal heart sounds, intact distal pulses and normal pulses. Exam reveals no gallop and no friction rub.  No murmur heard. Pulmonary/Chest: Effort normal and breath sounds normal. No tachypnea. No respiratory distress. She has no decreased breath sounds. She has no wheezes. She has no rhonchi. She has no rales.   Constant cough, scattered wheeze  Neurological: She is alert.  Skin: Skin is warm, dry and intact. No rash noted.  Psychiatric: Her speech is normal and behavior is normal. Judgment normal. Her mood appears not anxious. Cognition and memory are normal. She does not exhibit a depressed mood.          Assessment & Plan:

## 2017-07-12 NOTE — Assessment & Plan Note (Signed)
Likely bacterial given new elevated temp, severity of illness and tobacco history ( possible COPD and has new sputum change.   Treat with antibiotics and bronchodilator. Cough suppressant prn.. Use codeine.Marland Kitchenonly SE is keeps her up.. Will use during day prn.

## 2017-07-12 NOTE — Patient Instructions (Addendum)
Keep up with fluids.  Can use cough suppressant at night.  Complete antibiotics.  use albuterol as needed for coughing fits, wheeze and shortness of breath.  Go to  ER if severe shortness of breath or cannot  Keep down meds, fluids.

## 2017-08-18 ENCOUNTER — Other Ambulatory Visit: Payer: Self-pay | Admitting: *Deleted

## 2017-08-18 MED ORDER — ALBUTEROL SULFATE HFA 108 (90 BASE) MCG/ACT IN AERS: 2.0000 | INHALATION_SPRAY | Freq: Four times a day (QID) | RESPIRATORY_TRACT | 11 refills | Status: DC | PRN

## 2017-08-18 NOTE — Telephone Encounter (Signed)
Received fax from [facility] stating they spoke with Tiffany Orozco about her asthma care and noticed that she previously received a daily controller therapy but has not filled it at a [facility] pharmacy in the last 180 days.  Your patient would like [place] to reach out on their behalf to determine if it is appropriate to restart the daily controller therapy.  Ok to refill per Dr. Diona Browner.

## 2017-08-31 ENCOUNTER — Encounter: Payer: Self-pay | Admitting: Radiology

## 2017-09-08 ENCOUNTER — Other Ambulatory Visit: Payer: Self-pay | Admitting: *Deleted

## 2017-09-08 ENCOUNTER — Other Ambulatory Visit: Payer: Self-pay | Admitting: Family Medicine

## 2017-09-08 MED ORDER — SERTRALINE HCL 50 MG PO TABS: 50.0000 mg | ORAL_TABLET | Freq: Every day | ORAL | 1 refills | Status: DC

## 2017-09-13 ENCOUNTER — Other Ambulatory Visit: Payer: Self-pay | Admitting: Family Medicine

## 2017-09-21 ENCOUNTER — Other Ambulatory Visit: Payer: Self-pay | Admitting: Family Medicine

## 2017-10-31 ENCOUNTER — Inpatient Hospital Stay: Admission: RE | Admit: 2017-10-31 | Payer: Medicare Other | Source: Ambulatory Visit

## 2017-11-01 ENCOUNTER — Ambulatory Visit (INDEPENDENT_AMBULATORY_CARE_PROVIDER_SITE_OTHER)
Admission: RE | Admit: 2017-11-01 | Discharge: 2017-11-01 | Disposition: A | Discharge status: Home / Self Care | Payer: Medicare Other | Source: Ambulatory Visit | Attending: Acute Care | Admitting: Acute Care

## 2017-11-01 DIAGNOSIS — F17210 Nicotine dependence, cigarettes, uncomplicated: Secondary | ICD-10-CM | POA: Diagnosis not present

## 2017-11-07 ENCOUNTER — Ambulatory Visit: Payer: Medicare Other

## 2017-11-09 ENCOUNTER — Other Ambulatory Visit: Payer: Self-pay | Admitting: Acute Care

## 2017-11-09 DIAGNOSIS — F17210 Nicotine dependence, cigarettes, uncomplicated: Secondary | ICD-10-CM

## 2017-11-09 DIAGNOSIS — Z122 Encounter for screening for malignant neoplasm of respiratory organs: Secondary | ICD-10-CM

## 2017-11-15 ENCOUNTER — Encounter: Payer: Medicare Other | Admitting: Family Medicine

## 2017-12-13 ENCOUNTER — Telehealth: Payer: Self-pay | Admitting: Family Medicine

## 2017-12-13 DIAGNOSIS — E7800 Pure hypercholesterolemia, unspecified: Secondary | ICD-10-CM

## 2017-12-13 DIAGNOSIS — D473 Essential (hemorrhagic) thrombocythemia: Secondary | ICD-10-CM

## 2017-12-13 DIAGNOSIS — E039 Hypothyroidism, unspecified: Secondary | ICD-10-CM

## 2017-12-13 DIAGNOSIS — D75839 Thrombocytosis, unspecified: Secondary | ICD-10-CM

## 2017-12-13 NOTE — Telephone Encounter (Signed)
-----   Message from Eustace Pen, LPN sent at 0/09/2589  3:24 PM EDT ----- Regarding: Labs 7/17 Lab orders needed. Thank you.  Insurance:  [employer]

## 2017-12-14 ENCOUNTER — Ambulatory Visit: Payer: Medicare Other

## 2017-12-14 ENCOUNTER — Ambulatory Visit (INDEPENDENT_AMBULATORY_CARE_PROVIDER_SITE_OTHER): Payer: Medicare Other

## 2017-12-14 VITALS — BP 120/78 | HR 79 | Temp 97.9°F | Ht 59.0 in | Wt 188.2 lb

## 2017-12-14 DIAGNOSIS — E7800 Pure hypercholesterolemia, unspecified: Secondary | ICD-10-CM

## 2017-12-14 DIAGNOSIS — Z0000 Encounter for general adult medical examination without abnormal findings: Secondary | ICD-10-CM

## 2017-12-14 LAB — COMPREHENSIVE METABOLIC PANEL
ALT: 18 U/L (ref 0–35)
AST: 15 U/L (ref 0–37)
Albumin: 4.2 g/dL (ref 3.5–5.2)
Alkaline Phosphatase: 102 U/L (ref 39–117)
BUN: 9 mg/dL (ref 6–23)
CO2: 28 mEq/L (ref 19–32)
Calcium: 9.1 mg/dL (ref 8.4–10.5)
Chloride: 92 mEq/L — ABNORMAL LOW (ref 96–112)
Creatinine, Ser: 0.72 mg/dL (ref 0.40–1.20)
GFR: 85.32 mL/min (ref >60.00)
Glucose, Bld: 101 mg/dL — ABNORMAL HIGH (ref 70–99)
Potassium: 4.5 mEq/L (ref 3.5–5.1)
Sodium: 128 mEq/L — ABNORMAL LOW (ref 135–145)
Total Bilirubin: 0.4 mg/dL (ref 0.2–1.2)
Total Protein: 7.6 g/dL (ref 6.0–8.3)

## 2017-12-14 LAB — LIPID PANEL
Cholesterol: 151 mg/dL (ref 0–200)
HDL: 39.9 mg/dL (ref >39.00)
LDL Cholesterol: 82 mg/dL (ref 0–99)
NonHDL: 111.08
Total CHOL/HDL Ratio: 4
Triglycerides: 146 mg/dL (ref 0.0–149.0)
VLDL: 29.2 mg/dL (ref 0.0–40.0)

## 2017-12-14 NOTE — Progress Notes (Signed)
I reviewed health advisor's note, was available for consultation, and agree with documentation and plan.   Signed,  Viona Hosking T. Ary Lavine, MD  

## 2017-12-14 NOTE — Progress Notes (Signed)
PCP notes:   Health maintenance:  No gaps identified.  Abnormal screenings:   Depression score: 5 Depression screen [facility] 2/9 12/14/2017 06/10/2017 11/03/2016 10/28/2015 10/15/2014  Decreased Interest 0 3 0 0 0  Down, Depressed, Hopeless 1 2 0 - 0  PHQ - 2 Score 1 5 0 0 0  Altered sleeping 0 3 - 1 -  Tired, decreased energy 1 3 - 3 -  Change in appetite 1 3 - - -  Feeling bad or failure about yourself  1 1 - 0 -  Trouble concentrating 0 2 - 1 -  Moving slowly or fidgety/restless 1 2 - 0 -  Suicidal thoughts 0 0 - 0 -  PHQ-9 Score 5 19 - 5 -  Difficult doing work/chores Not difficult at all Somewhat difficult - - -  Some recent data might be hidden    Patient concerns:   Patient reports stopping Zoloft approx. 2 mths ago because she states she became extremely paranoid.  Nurse concerns:  None  Next PCP appt:   12/20/17 @ 1000

## 2017-12-14 NOTE — Progress Notes (Signed)
Subjective:   Tiffany Orozco is a 67 y.o. female who presents for Medicare Annual (Subsequent) preventive examination.  Review of Systems:  N/A Cardiac Risk Factors include: advanced age (>56men, >21 women);hypertension;obesity (BMI >30kg/m2);dyslipidemia;smoking/ tobacco exposure     Objective:     Vitals: BP 120/78 (BP Location: Right Arm, Patient Position: Sitting, Cuff Size: Normal)   Pulse 79   Temp 97.9 F (36.6 C) (Oral)   Ht 4\' 11"  (1.499 m) Comment: no shoes  Wt 188 lb 4 oz (85.4 kg)   SpO2 97%   BMI 38.02 kg/m   Body mass index is 38.02 kg/m.  Advanced Directives 12/14/2017 11/03/2016 06/08/2015 01/25/2015 07/03/2014  Does Patient Have a Medical Advance Directive? Yes No No No No  Does patient want to make changes to medical advance directive? Yes (MAU/Ambulatory/Procedural Areas - Information given) - - - -    Tobacco Social History   Tobacco Use  Smoking Status Current Every Day Smoker  . Packs/day: 1.00  . Years: 31.00  . Pack years: 31.00  . Types: Cigarettes  Smokeless Tobacco Never Used  Tobacco Comment   May try when husband finished treatment for prostate cancer     Ready to quit: No Counseling given: No Comment: May try when husband finished treatment for prostate cancer   Clinical Intake:  Pre-visit preparation completed: Yes  Pain : 0-10 Pain Score: 8  Pain Type: Chronic pain Pain Location: Generalized(lower back, bilateral hips) Pain Orientation: Left, Right, Lower Pain Descriptors / Indicators: Constant Pain Onset: More than a month ago Pain Frequency: Constant     Nutritional Status: BMI > 30  Obese Nutritional Risks: None Diabetes: No  How often do you need to have someone help you when you read instructions, pamphlets, or other written materials from your doctor or pharmacy?: 1 - Never  Interpreter Needed?: No  Comments: pt lives with spouse Information entered by :: LPinson, LPN  Past Medical History:  Diagnosis Date  .  Allergy   . Anxiety   . COPD (chronic obstructive pulmonary disease) ([place])   . DDD (degenerative disc disease), lumbar    mild  . Depression   . Female rectocele without uterine prolapse   . GERD (gastroesophageal reflux disease)   . Heart murmur   . Hyperlipidemia   . Hypertension   . Hypothyroidism   . IBS (irritable bowel syndrome)   . Influenza A 06/2015  . Obesity   . Osteopenia   . Skin cancer of arm   . Skin cancer of face   . Stroke ([place]) 07/2010   no permenant deficits  . Thrombocytopenia ([place])    Past Surgical History:  Procedure Laterality Date  . APPENDECTOMY    . CHOLECYSTECTOMY  2004  . EMGs  7/04   atms and wrists neg  . ESOPHAGEAL MANOMETRY N/A 03/17/2015   Procedure: ESOPHAGEAL MANOMETRY (EM);  Surgeon: Manus Gunning, MD;  Location: [facility];  Service: Gastroenterology;  Laterality: N/A;  . FOOT SURGERY  03/31/2009   R foot  . FOOT SURGERY  2008, 2009, 2010   right foot   . TUBAL LIGATION  1975   Family History  Problem Relation Age of Onset  . Skin cancer Father   . Hypertension Mother   . Cancer Brother        lung  . Cancer Sister        colon  . Cancer Sister        ovarian and esophageal  Social History   Socioeconomic History  . Marital status: Married    Spouse name: Yamili Lichtenwalner  . Number of children: 2  . Years of education: Not on file  . Highest education level: Not on file  Occupational History  . Occupation: bookkeeper    Fish farm manager: UNEMPLOYED    Comment: 10/10 not working x 9 years  Social Needs  . Financial resource strain: Not on file  . Food insecurity:    Worry: Not on file    Inability: Not on file  . Transportation needs:    Medical: Not on file    Non-medical: Not on file  Tobacco Use  . Smoking status: Current Every Day Smoker    Packs/day: 1.00    Years: 31.00    Pack years: 31.00    Types: Cigarettes  . Smokeless tobacco: Never Used  . Tobacco comment: May try when husband finished treatment  for prostate cancer  Substance and Sexual Activity  . Alcohol use: No    Alcohol/week: 0.0 oz  . Drug use: No  . Sexual activity: Yes    Birth control/protection: None  Lifestyle  . Physical activity:    Days per week: Not on file    Minutes per session: Not on file  . Stress: Not on file  Relationships  . Social connections:    Talks on phone: Not on file    Gets together: Not on file    Attends religious service: Not on file    Active member of club or organization: Not on file    Attends meetings of clubs or organizations: Not on file    Relationship status: Not on file  Other Topics Concern  . Not on file  Social History Narrative   5/10 husband had open heart surgery, not working, 2 grandchildren, siblings living and healthy    Outpatient Encounter Medications as of 12/14/2017  Medication Sig  . albuterol (PROVENTIL HFA;VENTOLIN HFA) 108 (90 Base) MCG/ACT inhaler Inhale 2 puffs into the lungs every 6 (six) hours as needed for wheezing or shortness of breath.  Marland Kitchen amLODipine (NORVASC) 5 MG tablet TAKE 1 TABLET (5 MG TOTAL) BY MOUTH DAILY.  Marland Kitchen ARMOUR THYROID 90 MG tablet TAKE 1 TABLET (90 MG TOTAL) BY MOUTH DAILY.  Marland Kitchen Cholecalciferol (VITAMIN D3) 2000 units TABS Take 1 tablet by mouth daily.  . Coenzyme Q10 (COQ10 PO) Take 1 capsule by mouth daily.  Marland Kitchen DEXILANT 60 MG capsule TAKE 1 CAPSULE (60 MG TOTAL) BY MOUTH DAILY.  Marland Kitchen dipyridamole-aspirin (AGGRENOX) 200-25 MG 12hr capsule TAKE 1 CAPSULE BY MOUTH 2 (TWO) TIMES DAILY.  Marland Kitchen ezetimibe (ZETIA) 10 MG tablet TAKE 1 TABLET (10 MG TOTAL) BY MOUTH DAILY.  . fluticasone (FLONASE) 50 MCG/ACT nasal spray SPRAY 2 SPRAYS INTO EACH NOSTRIL EVERY DAY  . hydrochlorothiazide (HYDRODIURIL) 25 MG tablet 1 tab po daily prn swelling  . Iodine, Kelp, (KELP PO) Take 600 mg by mouth daily.  . ondansetron (ZOFRAN-ODT) 8 MG disintegrating tablet Take 1 tablet (8 mg total) by mouth every 8 (eight) hours as needed for nausea or vomiting.  . polyethylene  glycol powder (GLYCOLAX/MIRALAX) powder MIX 1 CAPFUL (17 GRAMS) IN 8 OUNCES OF LIQUID AND DRINK DAILY AS NEEDED FOR CONSTIPATION.  . [DISCONTINUED] azithromycin (ZITHROMAX) 250 MG tablet 2 tab po x 1 day then 1 tab po daily  . [DISCONTINUED] guaiFENesin-codeine (ROBITUSSIN AC) 100-10 MG/5ML syrup Take 5-10 mLs by mouth at bedtime as needed for cough.  . sertraline (ZOLOFT) 50 MG  tablet Take 1 tablet (50 mg total) by mouth at bedtime. (Patient not taking: Reported on 12/14/2017)   No facility-administered encounter medications on file as of 12/14/2017.     Activities of Daily Living In your present state of health, do you have any difficulty performing the following activities: 12/14/2017  Hearing? N  Vision? N  Difficulty concentrating or making decisions? N  Walking or climbing stairs? Y  Dressing or bathing? N  Doing errands, shopping? N  Preparing Food and eating ? N  Using the Toilet? N  In the past six months, have you accidently leaked urine? N  Do you have problems with loss of bowel control? N  Managing your Medications? N  Managing your Finances? N  Housekeeping or managing your Housekeeping? N  Some recent data might be hidden    Patient Care Team: Jinny Sanders, MD as PCP - General    Assessment:   This is a routine wellness examination for [place].   Hearing Screening   125Hz  250Hz  500Hz  1000Hz  2000Hz  3000Hz  4000Hz  6000Hz  8000Hz   Right ear:   40 40 40  40    Left ear:   40 40 40  40    Vision Screening Comments: Vision exam in December 2018    Exercise Activities and Dietary recommendations Current Exercise Habits: The patient does not participate in regular exercise at present, Exercise limited by: orthopedic condition(s)  Goals    . Patient Stated     Starting 12/14/2017, I will continue to take medications as prescribed.        Fall Risk Fall Risk  12/14/2017 12/20/2016 11/03/2016 10/24/2015 10/15/2014  Falls in the past year? No No No No No  Comment - Emmi  Telephone Survey: data to providers prior to load - - -   Depression Screen PHQ 2/9 Scores 12/14/2017 06/10/2017 11/03/2016 10/28/2015  PHQ - 2 Score 1 5 0 0  PHQ- 9 Score 5 19 - 5     Cognitive Function MMSE - Mini Mental State Exam 12/14/2017 11/03/2016  Orientation to time 5 5  Orientation to Place 5 5  Registration 3 3  Attention/ Calculation 0 0  Recall 3 2  Recall-comments - pt was unable to recall 1 of 3 words  Language- name 2 objects 0 0  Language- repeat 1 1  Language- follow 3 step command 3 3  Language- read & follow direction 0 0  Write a sentence 0 0  Copy design 0 0  Total score 20 19     PLEASE NOTE: A Mini-Cog screen was completed. Maximum score is 20. A value of 0 denotes this part of [place] MMSE was not completed or the patient failed this part of the Mini-Cog screening.   Mini-Cog Screening Orientation to Time - Max 5 pts Orientation to Place - Max 5 pts Registration - Max 3 pts Recall - Max 3 pts Language Repeat - Max 1 pts Language Follow 3 Step Command - Max 3 pts     Immunization History  Administered Date(s) Administered  . Influenza Split 02/18/2011, 03/03/2012  . Influenza Whole 03/06/2007, 03/11/2008, 03/06/2009, 02/24/2010  . Influenza,inj,Quad PF,6+ Mos 02/16/2013, 03/22/2014, 02/25/2015, 04/29/2016, 03/03/2017  . Pneumococcal Conjugate-13 10/15/2014  . Pneumococcal Polysaccharide-23 10/16/2013  . Td 03/11/1997, 03/06/2009  . Zoster 05/18/2011   Screening Tests Health Maintenance  Topic Date Due  . INFLUENZA VACCINE  12/29/2017  . MAMMOGRAM  08/26/2018  . TETANUS/TDAP  03/07/2019  . COLONOSCOPY  05/05/2020  . DEXA SCAN  Completed  . Hepatitis C Screening  Completed  . PNA vac Low Risk Adult  Completed       Plan:   I have personally reviewed, addressed, and noted the following in the patient's chart:  A. Medical and social history B. Use of alcohol, tobacco or illicit drugs  C. Current medications and supplements D. Functional  ability and status E.  Nutritional status F.  Physical activity G. Advance directives H. List of other physicians I.  Hospitalizations, surgeries, and ER visits in previous 12 months J.  [place] to include hearing, vision, cognitive, depression L. Referrals and appointments - none  In addition, I have reviewed and discussed with patient certain preventive protocols, quality metrics, and best practice recommendations. A written personalized care plan for preventive services as well as general preventive health recommendations were provided to patient.  See attached scanned questionnaire for additional information.   Signed,   Lindell Noe, MHA, BS, LPN Health Coach

## 2017-12-14 NOTE — Patient Instructions (Signed)
Ms. Zazueta , Thank you for taking time to come for your Medicare Wellness Visit. I appreciate your ongoing commitment to your health goals. Please review the following plan we discussed and let me know if I can assist you in the future.   These are the goals we discussed: Goals    . Patient Stated     Starting 12/14/2017, I will continue to take medications as prescribed.        This is a list of the screening recommended for you and due dates:  Health Maintenance  Topic Date Due  . Flu Shot  12/29/2017  . Mammogram  08/26/2018  . Tetanus Vaccine  03/07/2019  . Colon Cancer Screening  05/05/2020  . DEXA scan (bone density measurement)  Completed  .  Hepatitis C: One time screening is recommended by Center for Disease Control  (CDC) for  adults born from [birth year] through 1967.   Completed  . Pneumonia vaccines  Completed   Preventive Care for Adults  A healthy lifestyle and preventive care can promote health and wellness. Preventive health guidelines for adults include the following key practices.  . A routine yearly physical is a good way to check with your health care provider about your health and preventive screening. It is a chance to share any concerns and updates on your health and to receive a thorough exam.  . Visit your dentist for a routine exam and preventive care every 6 months. Brush your teeth twice a day and floss once a day. Good oral hygiene prevents tooth decay and gum disease.  . The frequency of eye exams is based on your age, health, family medical history, use  of contact lenses, and other factors. Follow your health care provider's recommendations for frequency of eye exams.  . Eat a healthy diet. Foods like vegetables, fruits, whole grains, low-fat dairy products, and lean protein foods contain the nutrients you need without too many calories. Decrease your intake of foods high in solid fats, added sugars, and salt. Eat the right amount of calories for you. Get  information about a proper diet from your health care provider, if necessary.  . Regular physical exercise is one of the most important things you can do for your health. Most adults should get at least 150 minutes of moderate-intensity exercise (any activity that increases your heart rate and causes you to sweat) each week. In addition, most adults need muscle-strengthening exercises on 2 or more days a week.  Silver Sneakers may be a benefit available to you. To determine eligibility, you may visit the website: www.silversneakers.com or contact program at 306-520-6958 Mon-Fri between 8AM-8PM.   . Maintain a healthy weight. The body mass index (BMI) is a screening tool to identify possible weight problems. It provides an estimate of body fat based on height and weight. Your health care provider can find your BMI and can help you achieve or maintain a healthy weight.   For adults 20 years and older: ? A BMI below 18.5 is considered underweight. ? A BMI of 18.5 to 24.9 is normal. ? A BMI of 25 to 29.9 is considered overweight. ? A BMI of 30 and above is considered obese.   . Maintain normal blood lipids and cholesterol levels by exercising and minimizing your intake of saturated fat. Eat a balanced diet with plenty of fruit and vegetables. Blood tests for lipids and cholesterol should begin at age 62 and be repeated every 5 years. If your lipid or cholesterol  levels are high, you are over 50, or you are at high risk for heart disease, you may need your cholesterol levels checked more frequently. Ongoing high lipid and cholesterol levels should be treated with medicines if diet and exercise are not working.  . If you smoke, find out from your health care provider how to quit. If you do not use tobacco, please do not start.  . If you choose to drink alcohol, please do not consume more than 2 drinks per day. One drink is considered to be 12 ounces (355 mL) of beer, 5 ounces (148 mL) of wine, or 1.5  ounces (44 mL) of liquor.  . If you are 62-50 years old, ask your health care provider if you should take aspirin to prevent strokes.  . Use sunscreen. Apply sunscreen liberally and repeatedly throughout the day. You should seek shade when your shadow is shorter than you. Protect yourself by wearing long sleeves, pants, a wide-brimmed hat, and sunglasses year round, whenever you are outdoors.  . Once a month, do a whole body skin exam, using a mirror to look at the skin on your back. Tell your health care provider of new moles, moles that have irregular borders, moles that are larger than a pencil eraser, or moles that have changed in shape or color.

## 2017-12-16 ENCOUNTER — Other Ambulatory Visit: Payer: Self-pay | Admitting: Family Medicine

## 2017-12-20 ENCOUNTER — Encounter: Payer: Self-pay | Admitting: Family Medicine

## 2017-12-20 ENCOUNTER — Ambulatory Visit (INDEPENDENT_AMBULATORY_CARE_PROVIDER_SITE_OTHER): Payer: Medicare Other | Admitting: Family Medicine

## 2017-12-20 VITALS — BP 120/64 | HR 89 | Temp 97.6°F | Ht 59.0 in | Wt 186.5 lb

## 2017-12-20 DIAGNOSIS — E7800 Pure hypercholesterolemia, unspecified: Secondary | ICD-10-CM | POA: Diagnosis not present

## 2017-12-20 DIAGNOSIS — D473 Essential (hemorrhagic) thrombocythemia: Secondary | ICD-10-CM

## 2017-12-20 DIAGNOSIS — I10 Essential (primary) hypertension: Secondary | ICD-10-CM

## 2017-12-20 DIAGNOSIS — Z8673 Personal history of transient ischemic attack (TIA), and cerebral infarction without residual deficits: Secondary | ICD-10-CM | POA: Diagnosis not present

## 2017-12-20 DIAGNOSIS — F321 Major depressive disorder, single episode, moderate: Secondary | ICD-10-CM | POA: Diagnosis not present

## 2017-12-20 DIAGNOSIS — E039 Hypothyroidism, unspecified: Secondary | ICD-10-CM

## 2017-12-20 DIAGNOSIS — E871 Hypo-osmolality and hyponatremia: Secondary | ICD-10-CM | POA: Diagnosis not present

## 2017-12-20 DIAGNOSIS — D75839 Thrombocytosis, unspecified: Secondary | ICD-10-CM

## 2017-12-20 LAB — BASIC METABOLIC PANEL
BUN: 10 mg/dL (ref 6–23)
CO2: 28 mEq/L (ref 19–32)
Calcium: 9.2 mg/dL (ref 8.4–10.5)
Chloride: 98 mEq/L (ref 96–112)
Creatinine, Ser: 0.97 mg/dL (ref 0.40–1.20)
GFR: 60.48 mL/min (ref >60.00)
Glucose, Bld: 101 mg/dL — ABNORMAL HIGH (ref 70–99)
Potassium: 4.5 mEq/L (ref 3.5–5.1)
Sodium: 133 mEq/L — ABNORMAL LOW (ref 135–145)

## 2017-12-20 NOTE — Assessment & Plan Note (Signed)
Resolved last check 6 months ago.

## 2017-12-20 NOTE — Assessment & Plan Note (Signed)
Well controlled. Continue current medication. Encouraged exercise, weight loss, healthy eating habits.  

## 2017-12-20 NOTE — Progress Notes (Signed)
Subjective:    Patient ID: Tiffany Orozco, female    DOB: 1950/11/18, 67 y.o.   MRN: [id number]  HPI  The patient presents for  complete physical and review of chronic health problems. He/She also has the following acute concerns today:  The patient saw Candis Musa, LPN for medicare wellness. Note reviewed in detail and important notes copied below. Health maintenance:  No gaps identified.  Abnormal screenings:   Depression score: 5 Depression screen [facility] 2/9 12/14/2017 06/10/2017 11/03/2016 10/28/2015 10/15/2014  Decreased Interest 0 3 0 0 0  Down, Depressed, Hopeless 1 2 0 - 0  PHQ - 2 Score 1 5 0 0 0  Altered sleeping 0 3 - 1 -  Tired, decreased energy 1 3 - 3 -  Change in appetite 1 3 - - -  Feeling bad or failure about yourself  1 1 - 0 -  Trouble concentrating 0 2 - 1 -  Moving slowly or fidgety/restless 1 2 - 0 -  Suicidal thoughts 0 0 - 0 -  PHQ-9 Score 5 19 - 5 -  Difficult doing work/chores Not difficult at all Somewhat difficult - - -  Some recent data might be hidden   Patient concerns:  Patient reports stopping Zoloft approx. 2 mths ago because she states she became extremely paranoid.    Today 12/20/17  MDD: Good  Control per PHQ9 off zoloft. Stopped given it was making her feel paranoid.   Decreased.  She feels that she has very decreased motivation. No desire to do things. Wt Readings from Last 3 Encounters:  12/20/17 186 lb 8 oz (84.6 kg)  12/14/17 188 lb 4 oz (85.4 kg)  07/12/17 179 lb 4 oz (81.3 kg)    Hypertension:    Good control on HCTZ prn and amlodipine. BP Readings from Last 3 Encounters:  12/20/17 120/64  12/14/17 120/78  07/12/17 140/66  Using medication without problems or lightheadedness: none Chest pain with exertion:none Edema:none stable Short of breath: Average home BPs: Other issues:   Chronic  Moderate Hyponatremia ( she is symptomatic with headache, forgetful,  fatigue) ( has had similar in past as well , improved  in past with increae in salt in diet); she continues to have chronic intermittent emesis followed by GI.Marland Kitchen But this is not worse lately.. Not clearly the cause of hyponatremia. She has not take HCTZ recently.. Last time was 2 months ago.  She trys to eat a lot of salt in her diet.  Does not drink a  Lot of water  She has been off sertaline for 2-3 months.  She is a smoker  Hypothyroid  Stable at last check on armour thyroid Lab Results  Component Value Date   TSH 0.83 06/06/2017    Elevated Cholesterol:  LDL almost at goal < 70 on zetia. Improved form last year.  HX of CVA on aggrenox. Has been avoiding meat in general given chronic nausea/emesis. Lab Results  Component Value Date   CHOL 151 12/14/2017   HDL 39.90 12/14/2017   LDLCALC 82 12/14/2017   LDLDIRECT 103.0 09/02/2016   TRIG 146.0 12/14/2017   CHOLHDL 4 12/14/2017  Using medications without problems: Muscle aches:  Diet compliance: good Exercise:none Other complaints:   Smoker  Likely COPD:  Using albuterol prn.     Social History /Family History/Past Medical History reviewed in detail and updated in EMR if needed. Blood pressure 120/64, pulse 89, temperature 97.6 F (36.4 C), temperature source Oral, height 4\' 11"  (  1.499 m), weight 186 lb 8 oz (84.6 kg), SpO2 97 %.  Review of Systems  Constitutional: Negative for fatigue and fever.  HENT: Negative for congestion.   Eyes: Negative for pain.  Respiratory: Positive for shortness of breath. Negative for cough.   Cardiovascular: Negative for chest pain, palpitations and leg swelling.  Gastrointestinal: Negative for abdominal pain.  Genitourinary: Negative for dysuria and vaginal bleeding.  Musculoskeletal: Negative for back pain.  Neurological: Positive for weakness and headaches. Negative for syncope and light-headedness.  Psychiatric/Behavioral: Negative for dysphoric mood.       Objective:   Physical Exam  Constitutional: Vital signs are normal. She  appears well-developed and well-nourished. She is cooperative.  Non-toxic appearance. She does not appear ill. No distress.  HENT:  Head: Normocephalic.  Right Ear: Hearing, tympanic membrane, external ear and ear canal normal.  Left Ear: Hearing, tympanic membrane, external ear and ear canal normal.  Nose: Nose normal.  Eyes: Pupils are equal, round, and reactive to light. Conjunctivae, EOM and lids are normal. Lids are everted and swept, no foreign bodies found.  Neck: Trachea normal and normal range of motion. Neck supple. Carotid bruit is not present. No thyroid mass and no thyromegaly present.  Cardiovascular: Normal rate, regular rhythm, S1 normal, S2 normal, normal heart sounds and intact distal pulses. Exam reveals no gallop.  No murmur heard. Pulmonary/Chest: Effort normal and breath sounds normal. No respiratory distress. She has no wheezes. She has no rhonchi. She has no rales.  Abdominal: Soft. Normal appearance and bowel sounds are normal. She exhibits no distension, no fluid wave, no abdominal bruit and no mass. There is no hepatosplenomegaly. There is no tenderness. There is no rebound, no guarding and no CVA tenderness. No hernia.  Lymphadenopathy:    She has no cervical adenopathy.    She has no axillary adenopathy.  Neurological: She is alert. She has normal strength. No cranial nerve deficit or sensory deficit.  Skin: Skin is warm, dry and intact. No rash noted.  Psychiatric: Her speech is normal and behavior is normal. Judgment normal. Her mood appears not anxious. Cognition and memory are normal. She does not exhibit a depressed mood.          Assessment & Plan:  The patient's preventative maintenance and recommended screening tests for an annual wellness exam were reviewed in full today. Brought up to date unless services declined.  Counselled on the importance of diet, exercise, and its role in overall health and mortality. The patient's FH and SH was reviewed,  including their home life, tobacco status, and drug and alcohol status.   Vaccines: Uptodate  PAP/DVE:nml in 2013, followed by GYN.  No current symptoms of prolapsed uterus. Mammo: nml 07/2016 per GYN DXA: osteopenia 07/2016.. no significant change plan repeat in 2023 Colon: 04/2010, Dr. [name], nml, repeat in 10 years  Smoker:precontemplative >30 pack year history,  [place] lung cancer screening [place].last CT chest 10/29/2017: Lung-RADS 2, benign appearance or behavior. nml EKG in 07/2013.

## 2017-12-20 NOTE — Assessment & Plan Note (Addendum)
Stable control per pt and PHQ9 off medication. Some fatigue and motivation issues may be due to hyponatremia

## 2017-12-20 NOTE — Patient Instructions (Addendum)
Increase  exercise as able. Increase salt further in diet, restrict fluids further. Please stop at the lab to have labs drawn.

## 2017-12-20 NOTE — Assessment & Plan Note (Signed)
Moderate with some milder symptoms.  Restrict liquids and increase salt in diet.  Begin hyponatremia work up with urine studies and recheck salt.  Pt not to take SSRI or HCTZ ... Has not taken recently.

## 2017-12-20 NOTE — Assessment & Plan Note (Signed)
Well controlled. Continue current medication.  

## 2017-12-20 NOTE — Assessment & Plan Note (Signed)
ON aggrenox.  LDL gaol < 70

## 2017-12-22 LAB — OSMOLALITY, URINE: Osmolality, Ur: 284 mOsm/kg (ref 50–1200)

## 2017-12-22 LAB — SODIUM, URINE, RANDOM: Sodium, Ur: 35 mmol/L (ref 28–272)

## 2018-02-28 ENCOUNTER — Other Ambulatory Visit: Payer: Self-pay | Admitting: Family Medicine

## 2018-03-14 ENCOUNTER — Other Ambulatory Visit: Payer: Self-pay | Admitting: Family Medicine

## 2018-03-16 ENCOUNTER — Ambulatory Visit (INDEPENDENT_AMBULATORY_CARE_PROVIDER_SITE_OTHER): Payer: Medicare Other

## 2018-03-16 ENCOUNTER — Telehealth: Payer: Self-pay | Admitting: Family Medicine

## 2018-03-16 DIAGNOSIS — Z23 Encounter for immunization: Secondary | ICD-10-CM

## 2018-03-16 NOTE — Telephone Encounter (Signed)
Pt dropped off parking placard form. Placed in Rx tower

## 2018-03-16 NOTE — Telephone Encounter (Signed)
Form completed and placed in Dr. Bedsole's in box for signature. 

## 2018-03-17 ENCOUNTER — Other Ambulatory Visit: Payer: Self-pay | Admitting: Family Medicine

## 2018-03-20 NOTE — Telephone Encounter (Signed)
Left message for Tiffany Orozco that her handicap placard application is ready to be picked up at the front desk.

## 2018-03-28 ENCOUNTER — Other Ambulatory Visit: Payer: Self-pay | Admitting: Family Medicine

## 2018-04-26 ENCOUNTER — Other Ambulatory Visit: Payer: Self-pay | Admitting: Family Medicine

## 2018-05-29 ENCOUNTER — Ambulatory Visit (INDEPENDENT_AMBULATORY_CARE_PROVIDER_SITE_OTHER)
Admission: RE | Admit: 2018-05-29 | Discharge: 2018-05-29 | Disposition: A | Discharge status: Home / Self Care | Payer: Medicare Other | Source: Ambulatory Visit | Attending: Internal Medicine | Admitting: Internal Medicine

## 2018-05-29 ENCOUNTER — Ambulatory Visit (INDEPENDENT_AMBULATORY_CARE_PROVIDER_SITE_OTHER): Payer: Medicare Other | Admitting: Internal Medicine

## 2018-05-29 ENCOUNTER — Encounter: Payer: Self-pay | Admitting: Internal Medicine

## 2018-05-29 VITALS — BP 122/80 | HR 88 | Temp 98.1°F | Resp 30 | Ht 59.0 in | Wt 183.0 lb

## 2018-05-29 DIAGNOSIS — J42 Unspecified chronic bronchitis: Secondary | ICD-10-CM

## 2018-05-29 DIAGNOSIS — J209 Acute bronchitis, unspecified: Secondary | ICD-10-CM | POA: Diagnosis not present

## 2018-05-29 DIAGNOSIS — R05 Cough: Secondary | ICD-10-CM | POA: Diagnosis not present

## 2018-05-29 MED ORDER — AZITHROMYCIN 250 MG PO TABS: ORAL_TABLET | ORAL | 0 refills | Status: DC

## 2018-05-29 MED ORDER — IPRATROPIUM-ALBUTEROL 0.5-2.5 (3) MG/3ML IN SOLN
3.0000 mL | Freq: Once | RESPIRATORY_TRACT | Status: AC
  Administered 2018-05-29: 3 mL via RESPIRATORY_TRACT

## 2018-05-29 NOTE — Assessment & Plan Note (Addendum)
Has chronic bronchitis by history (and ??emphysema as well) Now with persistent cough, dyspnea, tight and wheezy Doesn't tolerate steroids Will try duoneb and check CXR May have sinusitis as well  CXR looks fine Will treat with z pak She will start the inhaler

## 2018-05-29 NOTE — Progress Notes (Signed)
Subjective:    Patient ID: Tiffany Orozco, female    DOB: [birth year], 67 y.o.   MRN: [id number]  HPI Here due to respiratory illness  Started over a month ago Started with nausea and diarrhea "a long time ago" Noted cough, stuffy nose, sore throat Coughs up sputum with "air bubbles' Lots of nasal discharge  Chest is very sore---hurts front to back Trouble sleeping---"smothering" sensation  Has SOB---but hasn't tried the inhaler Low grade fever intermittently Uses aleve and mucinex Still smokes----gets regular bouts of bronchitis Has stopped due to the illness for now Some cold chills  Current Outpatient Medications on File Prior to Visit  Medication Sig Dispense Refill  . albuterol (PROVENTIL HFA;VENTOLIN HFA) 108 (90 Base) MCG/ACT inhaler Inhale 2 puffs into the lungs every 6 (six) hours as needed for wheezing or shortness of breath. 18 g 11  . amLODipine (NORVASC) 5 MG tablet TAKE 1 TABLET BY MOUTH EVERY DAY 90 tablet 1  . ARMOUR THYROID 90 MG tablet TAKE 1 TABLET BY MOUTH EVERY DAY 90 tablet 1  . Cholecalciferol (VITAMIN D3) 2000 units TABS Take 1 tablet by mouth daily.    . Coenzyme Q10 (COQ10 PO) Take 1 capsule by mouth daily.    Marland Kitchen DEXILANT 60 MG capsule TAKE 1 CAPSULE BY MOUTH EVERY DAY 90 capsule 3  . dipyridamole-aspirin (AGGRENOX) 200-25 MG 12hr capsule TAKE 1 CAPSULE BY MOUTH 2 (TWO) TIMES DAILY. 180 capsule 1  . ezetimibe (ZETIA) 10 MG tablet TAKE 1 TABLET (10 MG TOTAL) BY MOUTH DAILY. 90 tablet 1  . fluticasone (FLONASE) 50 MCG/ACT nasal spray SPRAY 2 SPRAYS INTO EACH NOSTRIL EVERY DAY 48 g 1  . hydrochlorothiazide (HYDRODIURIL) 25 MG tablet 1 tab po daily prn swelling 30 tablet 11  . Iodine, Kelp, (KELP PO) Take 600 mg by mouth daily.    . ondansetron (ZOFRAN-ODT) 8 MG disintegrating tablet Take 1 tablet (8 mg total) by mouth every 8 (eight) hours as needed for nausea or vomiting. 20 tablet 2  . polyethylene glycol powder (GLYCOLAX/MIRALAX) powder MIX 1 CAPFUL  (17 GRAMS) IN 8 OUNCES OF LIQUID AND DRINK DAILY AS NEEDED FOR CONSTIPATION. 527 g 0   No current facility-administered medications on file prior to visit.     Allergies  Allergen Reactions  . Lisinopril     Shortness of breath.  . Niacin     REACTION: dizziness  . Welchol [Colesevelam Hcl] Other (See Comments)    Diarrhea,Nausea, swelling in face  . Augmentin [Amoxicillin-Pot Clavulanate]     Nauseated. GI upset with taking.  . Codeine     keeps patient awake  . Oxycodone Hcl     Patient states this medication makes her hyper    Past Medical History:  Diagnosis Date  . Allergy   . Anxiety   . COPD (chronic obstructive pulmonary disease) ([place])   . DDD (degenerative disc disease), lumbar    mild  . Depression   . Female rectocele without uterine prolapse   . GERD (gastroesophageal reflux disease)   . Heart murmur   . Hyperlipidemia   . Hypertension   . Hypothyroidism   . IBS (irritable bowel syndrome)   . Influenza A 06/2015  . Obesity   . Osteopenia   . Skin cancer of arm   . Skin cancer of face   . Stroke ([place]) 07/2010   no permenant deficits  . Thrombocytopenia ([place])     Past Surgical History:  Procedure Laterality Date  .  APPENDECTOMY    . CHOLECYSTECTOMY  2004  . EMGs  7/04   atms and wrists neg  . ESOPHAGEAL MANOMETRY N/A 03/17/2015   Procedure: ESOPHAGEAL MANOMETRY (EM);  Surgeon: Manus Gunning, MD;  Location: [facility];  Service: Gastroenterology;  Laterality: N/A;  . FOOT SURGERY  03/31/2009   R foot  . FOOT SURGERY  2008, 2009, 2010   right foot   . TUBAL LIGATION  1975    Family History  Problem Relation Age of Onset  . Skin cancer Father   . Hypertension Mother   . Cancer Brother        lung  . Cancer Sister        colon  . Cancer Sister        ovarian and esophageal    Social History   Socioeconomic History  . Marital status: Married    Spouse name: Saamiya Jeppsen  . Number of children: 2  . Years of education: Not on  file  . Highest education level: Not on file  Occupational History  . Occupation: bookkeeper    Fish farm manager: UNEMPLOYED    Comment: 10/10 not working x 9 years  Social Needs  . Financial resource strain: Not on file  . Food insecurity:    Worry: Not on file    Inability: Not on file  . Transportation needs:    Medical: Not on file    Non-medical: Not on file  Tobacco Use  . Smoking status: Current Every Day Smoker    Packs/day: 1.00    Years: 31.00    Pack years: 31.00    Types: Cigarettes  . Smokeless tobacco: Never Used  . Tobacco comment: States she is not smoking during current illness 05/29/18 sb  Substance and Sexual Activity  . Alcohol use: No    Alcohol/week: 0.0 standard drinks  . Drug use: No  . Sexual activity: Yes    Birth control/protection: None  Lifestyle  . Physical activity:    Days per week: Not on file    Minutes per session: Not on file  . Stress: Not on file  Relationships  . Social connections:    Talks on phone: Not on file    Gets together: Not on file    Attends religious service: Not on file    Active member of club or organization: Not on file    Attends meetings of clubs or organizations: Not on file    Relationship status: Not on file  . Intimate partner violence:    Fear of current or ex partner: Not on file    Emotionally abused: Not on file    Physically abused: Not on file    Forced sexual activity: Not on file  Other Topics Concern  . Not on file  Social History Narrative   5/10 husband had open heart surgery, not working, 2 grandchildren, siblings living and healthy   Review of Systems Stomach swells up after eating Easy nausea for a long time--belches a lot    Objective:   Physical Exam  Constitutional: No distress.  Hacking cough but not true distress  HENT:  Mouth/Throat: Oropharynx is clear and moist. No oropharyngeal exudate.  Mild maxillary tenderness Mild nasal congestion TMs normal  Neck: No thyromegaly present.    Respiratory: She has no rales.  Decreased breath sounds Prolonged expiration and some exp wheezes   Lymphadenopathy:    She has no cervical adenopathy.  Assessment & Plan:

## 2018-05-30 ENCOUNTER — Telehealth: Payer: Self-pay | Admitting: Family Medicine

## 2018-05-30 NOTE — Telephone Encounter (Signed)
Pt want to know results of her xrays from yesterday, please advise.

## 2018-06-01 NOTE — Telephone Encounter (Signed)
Spoke to pt

## 2018-06-01 NOTE — Telephone Encounter (Signed)
Please let her know that the CXR was normal

## 2018-06-01 NOTE — Telephone Encounter (Signed)
I do not have the results from Dr Silvio Pate. Will send this note to him.

## 2018-06-02 ENCOUNTER — Telehealth: Payer: Self-pay | Admitting: *Deleted

## 2018-06-02 ENCOUNTER — Other Ambulatory Visit: Payer: Self-pay | Admitting: Family Medicine

## 2018-06-02 MED ORDER — GUAIFENESIN-CODEINE 100-10 MG/5ML PO SYRP: 5.0000 mL | ORAL_SOLUTION | Freq: Every evening | ORAL | 0 refills | Status: DC | PRN

## 2018-06-02 MED ORDER — BENZONATATE 200 MG PO CAPS: 200.0000 mg | ORAL_CAPSULE | Freq: Two times a day (BID) | ORAL | 0 refills | Status: DC | PRN

## 2018-06-02 NOTE — Telephone Encounter (Signed)
Call  Will send in cough medication (  Sent benzonatate given allergy/SE to codeine and oxycodone) but if not improving after completed antibiotics.Marland Kitchen needs to follow up with appt.

## 2018-06-02 NOTE — Telephone Encounter (Signed)
Ms. Tiffany Orozco notified as instructed by telephone.  She states the Benzonatate cost too much.  She states she tolerates cough syrup with codeine.  The codeine in that does to seem to bother her.

## 2018-06-02 NOTE — Addendum Note (Signed)
Addended by: [name] E on: 06/02/2018 01:44 PM   Modules accepted: Orders

## 2018-06-02 NOTE — Telephone Encounter (Signed)
See below

## 2018-06-02 NOTE — Telephone Encounter (Signed)
Spoke to pt who states she was seen 12/30 and given a zpak. She has started it as well as the inhaler, as directed, but reports that her cough has worsened. She is now "coughing up lots of stuff" and is wanting to know if she can have a cough med sent to [facility]. pls advise

## 2018-07-19 ENCOUNTER — Encounter: Payer: Self-pay | Admitting: Family Medicine

## 2018-07-19 ENCOUNTER — Ambulatory Visit (INDEPENDENT_AMBULATORY_CARE_PROVIDER_SITE_OTHER): Payer: Medicare Other | Admitting: Family Medicine

## 2018-07-19 VITALS — BP 138/72 | HR 89 | Temp 98.5°F | Ht 59.0 in | Wt 187.5 lb

## 2018-07-19 DIAGNOSIS — F339 Major depressive disorder, recurrent, unspecified: Secondary | ICD-10-CM | POA: Diagnosis not present

## 2018-07-19 DIAGNOSIS — Z720 Tobacco use: Secondary | ICD-10-CM | POA: Diagnosis not present

## 2018-07-19 DIAGNOSIS — J0100 Acute maxillary sinusitis, unspecified: Secondary | ICD-10-CM

## 2018-07-19 DIAGNOSIS — E038 Other specified hypothyroidism: Secondary | ICD-10-CM

## 2018-07-19 LAB — T3, FREE: T3, Free: 6 pg/mL — ABNORMAL HIGH (ref 2.3–4.2)

## 2018-07-19 LAB — T4, FREE: Free T4: 0.81 ng/dL (ref 0.60–1.60)

## 2018-07-19 LAB — TSH: TSH: 1.1 u[IU]/mL (ref 0.35–4.50)

## 2018-07-19 MED ORDER — AMOXICILLIN 500 MG PO CAPS: 1000.0000 mg | ORAL_CAPSULE | Freq: Two times a day (BID) | ORAL | 0 refills | Status: AC

## 2018-07-19 MED ORDER — BUPROPION HCL ER (XL) 150 MG PO TB24: 150.0000 mg | ORAL_TABLET | Freq: Every day | ORAL | 3 refills | Status: DC

## 2018-07-19 NOTE — Progress Notes (Signed)
Dr. Frederico Hamman T. Beyla Loney, MD, [facility] [place], [address] Phone: [phone] Fax: [phone]  07/19/2018  Patient: Tiffany Orozco, MRN: [id number], DOB: 1950/10/20, 68 y.o.  Primary Physician:  Jinny Sanders, MD   Chief Complaint  Patient presents with  . Otalgia  . Sore Throat  . Sinus Drainage  . Headache    Right side of head   Subjective:   Tiffany Orozco is a 68 y.o. very pleasant female patient who presents with the following:  R ear is really hurting and having some pain acutely.  Stabbing and shooting pain and up in the R side of face. Worse with position changes and worse with cold chills and worse with bending over. Pain at R max sinus.  Using her flonase. Using her certrizine.   She also tells me that she is feeling down and depressed, that she has some anhedonia.  She tells me that she has a history of some mild depression off and on, and she also has a history of some seasonal affective disorder.  She denies SI or HI.  She also has a history of tobacco abuse.  Past Medical History, Surgical History, Social History, Family History, Problem List, Medications, and Allergies have been reviewed and updated if relevant.  Patient Active Problem List   Diagnosis Date Noted  . Acute exacerbation of chronic bronchitis ([place]) 05/29/2018  . Esophagitis 09/02/2016  . PUD (peptic ulcer disease) 09/02/2016  . Coronary atherosclerosis of native coronary artery 05/04/2016  . Tobacco abuse 06/09/2015  . History of CVA (cerebrovascular accident) 06/08/2015  . IBS (irritable bowel syndrome) 06/08/2015  . Chronic nausea 01/17/2015  . Chronic back pain 07/12/2014  . Dysphagia 04/19/2014  . Acute bronchitis 02/13/2014  . Fatigue 09/12/2012  . HTN (hypertension) 11/30/2011  . Herniation of rectum into vagina 08/29/2011  . Thrombocytosis ([place]) 06/08/2011  . Hyponatremia 03/26/2011  . Dyspnea 03/23/2011  .  CONSTIPATION, CHRONIC 03/19/2010  . Osteopenia 03/19/2010  . MURMUR 03/20/2009  . UNSPECIFIED CARDIAC DYSRHYTHMIA 03/06/2009  . Allergic rhinitis 08/14/2007  . Hypothyroidism 02/27/2007  . HYPERCHOLESTEROLEMIA 02/27/2007  . Generalized anxiety disorder 02/27/2007  . Depression, major, single episode, moderate ([place]) 02/27/2007  . GERD 02/27/2007    Past Medical History:  Diagnosis Date  . Allergy   . Anxiety   . COPD (chronic obstructive pulmonary disease) ([place])   . DDD (degenerative disc disease), lumbar    mild  . Depression   . Female rectocele without uterine prolapse   . GERD (gastroesophageal reflux disease)   . Heart murmur   . Hyperlipidemia   . Hypertension   . Hypothyroidism   . IBS (irritable bowel syndrome)   . Influenza A 06/2015  . Obesity   . Osteopenia   . Skin cancer of arm   . Skin cancer of face   . Stroke ([place]) 07/2010   no permenant deficits  . Thrombocytopenia ([place])     Past Surgical History:  Procedure Laterality Date  . APPENDECTOMY    . CHOLECYSTECTOMY  2004  . EMGs  7/04   atms and wrists neg  . ESOPHAGEAL MANOMETRY N/A 03/17/2015   Procedure: ESOPHAGEAL MANOMETRY (EM);  Surgeon: Manus Gunning, MD;  Location: [facility];  Service: Gastroenterology;  Laterality: N/A;  . FOOT SURGERY  03/31/2009   R foot  . FOOT SURGERY  2008, 2009, 2010   right foot   . [place]  Social History   Socioeconomic History  . Marital status: Married    Spouse name: Rakiya Krawczyk  . Number of children: 2  . Years of education: Not on file  . Highest education level: Not on file  Occupational History  . Occupation: bookkeeper    Fish farm manager: UNEMPLOYED    Comment: 10/10 not working x 9 years  Social Needs  . Financial resource strain: Not on file  . Food insecurity:    Worry: Not on file    Inability: Not on file  . Transportation needs:    Medical: Not on file    Non-medical: Not on file  Tobacco Use  . Smoking status:  Current Every Day Smoker    Packs/day: 1.00    Years: 31.00    Pack years: 31.00    Types: Cigarettes  . Smokeless tobacco: Never Used  . Tobacco comment: States she is not smoking during current illness 05/29/18 sb  Substance and Sexual Activity  . Alcohol use: No    Alcohol/week: 0.0 standard drinks  . Drug use: No  . Sexual activity: Yes    Birth control/protection: None  Lifestyle  . Physical activity:    Days per week: Not on file    Minutes per session: Not on file  . Stress: Not on file  Relationships  . Social connections:    Talks on phone: Not on file    Gets together: Not on file    Attends religious service: Not on file    Active member of club or organization: Not on file    Attends meetings of clubs or organizations: Not on file    Relationship status: Not on file  . Intimate partner violence:    Fear of current or ex partner: Not on file    Emotionally abused: Not on file    Physically abused: Not on file    Forced sexual activity: Not on file  Other Topics Concern  . Not on file  Social History Narrative   5/10 husband had open heart surgery, not working, 2 grandchildren, siblings living and healthy    Family History  Problem Relation Age of Onset  . Skin cancer Father   . Hypertension Mother   . Cancer Brother        lung  . Cancer Sister        colon  . Cancer Sister        ovarian and esophageal    Allergies  Allergen Reactions  . Lisinopril     Shortness of breath.  . Niacin     REACTION: dizziness  . Welchol [Colesevelam Hcl] Other (See Comments)    Diarrhea,Nausea, swelling in face  . Augmentin [Amoxicillin-Pot Clavulanate]     Nauseated. GI upset with taking.  . Codeine     keeps patient awake  . Oxycodone Hcl     Patient states this medication makes her hyper    Medication list reviewed and updated in full in [facility].  ROS: GEN: Acute illness details above GI: Tolerating PO intake GU: maintaining adequate hydration  and urination Pulm: No SOB Interactive and getting along well at home.  Otherwise, ROS is as per the HPI.  Objective:   BP 138/72   Pulse 89   Temp 98.5 F (36.9 C) (Oral)   Ht 4\' 11"  (1.499 m)   Wt 187 lb 8 oz (85 kg)   BMI 37.87 kg/m    Gen: WDWN, NAD; alert,appropriate and cooperative throughout exam  HEENT: Normocephalic and atraumatic. Throat clear, w/o exudate, no LAD, R TM clear, L TM - good landmarks, No fluid present. rhinnorhea.  Left frontal and maxillary sinuses: non-Tender Right frontal and maxillary sinuses: Tender max   Neck: No ant or post LAD  CV: RRR, No M/G/R  Pulm: Breathing comfortably in no resp distress. no w/c/r  Abd: S,NT,ND,+BS Extr: no c/c/e Psych: full affect, pleasant    Laboratory and Imaging Data:  Assessment and Plan:   Acute non-recurrent maxillary sinusitis  Other specified hypothyroidism - Plan: TSH, T3, free, T4, free  Depression, recurrent (HCC)  Tobacco abuse  Classic maxillary sinusitis.  It sounds as if her depression has returned, and I am going to restart her on Wellbutrin and have her follow-up with her primary care doctor.  Hypothyroidism, no recent thyroid studies, so we will recheck these today, which certainly could be making a difference with her depression.  Follow-up: Return for follow-up 6-8 weeks with Dr. Diona Browner.  Meds ordered this encounter  Medications  . amoxicillin (AMOXIL) 500 MG capsule    Sig: Take 2 capsules (1,000 mg total) by mouth 2 (two) times daily for 10 days.    Dispense:  40 capsule    Refill:  0  . buPROPion (WELLBUTRIN XL) 150 MG 24 hr tablet    Sig: Take 1 tablet (150 mg total) by mouth daily.    Dispense:  30 tablet    Refill:  3   Orders Placed This Encounter  Procedures  . TSH  . T3, free  . T4, free    Signed,  Tyrena Gohr T. Jaime Grizzell, MD   Outpatient Encounter Medications as of 07/19/2018  Medication Sig  . albuterol (PROVENTIL HFA;VENTOLIN HFA) 108 (90 Base) MCG/ACT  inhaler Inhale 2 puffs into the lungs every 6 (six) hours as needed for wheezing or shortness of breath.  Marland Kitchen amLODipine (NORVASC) 5 MG tablet TAKE 1 TABLET BY MOUTH EVERY DAY  . ARMOUR THYROID 90 MG tablet TAKE 1 TABLET BY MOUTH EVERY DAY  . Cholecalciferol (VITAMIN D3) 2000 units TABS Take 1 tablet by mouth daily.  . Coenzyme Q10 (COQ10 PO) Take 1 capsule by mouth daily.  Marland Kitchen DEXILANT 60 MG capsule TAKE 1 CAPSULE BY MOUTH EVERY DAY  . dipyridamole-aspirin (AGGRENOX) 200-25 MG 12hr capsule TAKE 1 CAPSULE BY MOUTH 2 (TWO) TIMES DAILY.  Marland Kitchen ezetimibe (ZETIA) 10 MG tablet TAKE 1 TABLET (10 MG TOTAL) BY MOUTH DAILY.  . fluticasone (FLONASE) 50 MCG/ACT nasal spray SPRAY 2 SPRAYS INTO EACH NOSTRIL EVERY DAY  . guaiFENesin-codeine (ROBITUSSIN AC) 100-10 MG/5ML syrup Take 5-10 mLs by mouth at bedtime as needed for cough.  . hydrochlorothiazide (HYDRODIURIL) 25 MG tablet 1 tab po daily prn swelling  . Iodine, Kelp, (KELP PO) Take 600 mg by mouth daily.  . ondansetron (ZOFRAN-ODT) 8 MG disintegrating tablet Take 1 tablet (8 mg total) by mouth every 8 (eight) hours as needed for nausea or vomiting.  . polyethylene glycol powder (GLYCOLAX/MIRALAX) powder MIX 1 CAPFUL (17 GRAMS) IN 8 OUNCES OF LIQUID AND DRINK DAILY AS NEEDED FOR CONSTIPATION.  Marland Kitchen amoxicillin (AMOXIL) 500 MG capsule Take 2 capsules (1,000 mg total) by mouth 2 (two) times daily for 10 days.  Marland Kitchen buPROPion (WELLBUTRIN XL) 150 MG 24 hr tablet Take 1 tablet (150 mg total) by mouth daily.  . [DISCONTINUED] azithromycin (ZITHROMAX Z-PAK) 250 MG tablet Take 2 tablets (500 mg) on  Day 1,  followed by 1 tablet (250 mg) once daily on Days 2 through  5.   No facility-administered encounter medications on file as of 07/19/2018.

## 2018-07-20 NOTE — Addendum Note (Signed)
Addended by: Carter Kitten on: 07/20/2018 10:21 AM   Modules accepted: Orders

## 2018-07-27 ENCOUNTER — Telehealth: Payer: Self-pay

## 2018-07-27 NOTE — Telephone Encounter (Signed)
Pt was seen 07/19/18; pt still has 2 more days taking med; pt is sleepy; having head congestion and fluid in ears is really bothering pt; pt feels light headed and like her head is in a drum. Ears are popping. No cough and no fever. Pt wants to know if can get another med to help clear up ears sent to [facility]. Pt would prefer to have med called in instead of being seen or will it just take more time for ears to clear. Please advise.

## 2018-07-27 NOTE — Telephone Encounter (Signed)
I would be sure that she is doing her Flonase, 2 puffs in each nostril once a day  For the next 3 or 4 days, then I would add some OTC nasal afrin 2 puffs in each nostril twice a day  Aside from this, not much that will definitely clear this up - time should help heal

## 2018-07-28 NOTE — Telephone Encounter (Signed)
[name] notified as instructed by telephone.  Patient states understanding.

## 2018-08-04 ENCOUNTER — Encounter: Payer: Self-pay | Admitting: Family Medicine

## 2018-08-04 ENCOUNTER — Ambulatory Visit (INDEPENDENT_AMBULATORY_CARE_PROVIDER_SITE_OTHER): Payer: Medicare Other | Admitting: Family Medicine

## 2018-08-04 ENCOUNTER — Ambulatory Visit: Payer: Medicare Other | Admitting: Family Medicine

## 2018-08-04 DIAGNOSIS — F321 Major depressive disorder, single episode, moderate: Secondary | ICD-10-CM

## 2018-08-04 DIAGNOSIS — H6983 Other specified disorders of Eustachian tube, bilateral: Secondary | ICD-10-CM

## 2018-08-04 MED ORDER — METHYLPREDNISOLONE ACETATE 40 MG/ML IJ SUSP
40.0000 mg | Freq: Once | INTRAMUSCULAR | Status: AC
  Administered 2018-08-04: 40 mg via INTRAMUSCULAR

## 2018-08-04 MED ORDER — ALBUTEROL SULFATE HFA 108 (90 BASE) MCG/ACT IN AERS: 2.0000 | INHALATION_SPRAY | Freq: Four times a day (QID) | RESPIRATORY_TRACT | 11 refills | Status: DC | PRN

## 2018-08-04 NOTE — Patient Instructions (Addendum)
Given steroid shot today.  Nasal saline spray before flonase then flonase 2 spray per nostril.  Call if any fever, or ear pain.

## 2018-08-04 NOTE — Progress Notes (Signed)
Subjective:    Patient ID: Tiffany Orozco, female    DOB: Jul 02, 1950, 68 y.o.   MRN: [id number]  HPI  68 year old female pt presents for  Follow up sinusitis, nonresolving.  Seen by Dr. Lorelei Pont on 2/19 Dx with classic maxillary sinusitis and treated with  Amoxicillin.  Also treated for bronchtis  Restarted on Wellbutrin at the same time given recurrence of depression.  She feel  Much better overall.   Today she reports:  Continued fullness in ear.. cannot ear, not pain  Copious clear mucus  Pain in face improved. No fever. Mild cough. She feels some short breath due to congestion, no wheeze. Not requiring albuterol now.  Using mucinex DM and flonase 2 sprays daily. Taking zyrtec daily.  Social History /Family History/Past Medical History reviewed in detail and updated in EMR if needed. Blood pressure 122/68, pulse 86, temperature 97.6 F (36.4 C), temperature source Oral, height 4\' 11"  (1.499 m), weight 186 lb 8 oz (84.6 kg), SpO2 97 %.  Review of Systems  Constitutional: Negative for fatigue and fever.  HENT: Negative for congestion.   Eyes: Negative for pain.  Respiratory: Negative for cough and shortness of breath.   Cardiovascular: Negative for chest pain, palpitations and leg swelling.  Gastrointestinal: Negative for abdominal pain.  Genitourinary: Negative for dysuria and vaginal bleeding.  Musculoskeletal: Negative for back pain.  Neurological: Negative for syncope, light-headedness and headaches.  Psychiatric/Behavioral: Negative for dysphoric mood.       Objective:   Physical Exam Constitutional:      General: She is not in acute distress.    Appearance: Normal appearance. She is well-developed. She is not ill-appearing or toxic-appearing.  HENT:     Head: Normocephalic.     Right Ear: Hearing, ear canal and external ear normal. A middle ear effusion is present. Tympanic membrane is not injected, scarred, erythematous, retracted or bulging.     Left Ear:  Hearing, ear canal and external ear normal. A middle ear effusion is present. Tympanic membrane is not injected, scarred, erythematous, retracted or bulging.     Nose: No mucosal edema or rhinorrhea.     Right Sinus: No maxillary sinus tenderness or frontal sinus tenderness.     Left Sinus: No maxillary sinus tenderness or frontal sinus tenderness.     Mouth/Throat:     Pharynx: Uvula midline.  Eyes:     General: Lids are normal. Lids are everted, no foreign bodies appreciated.     Conjunctiva/sclera: Conjunctivae normal.     Pupils: Pupils are equal, round, and reactive to light.  Neck:     Musculoskeletal: Normal range of motion and neck supple.     Thyroid: No thyroid mass or thyromegaly.     Vascular: No carotid bruit.     Trachea: Trachea normal.  Cardiovascular:     Rate and Rhythm: Normal rate and regular rhythm.     Pulses: Normal pulses.     Heart sounds: Normal heart sounds, S1 normal and S2 normal. No murmur. No friction rub. No gallop.   Pulmonary:     Effort: Pulmonary effort is normal. No tachypnea or respiratory distress.     Breath sounds: Normal breath sounds. No decreased breath sounds, wheezing, rhonchi or rales.  Abdominal:     General: Bowel sounds are normal.     Palpations: Abdomen is soft.     Tenderness: There is no abdominal tenderness.  Skin:    General: Skin is warm and dry.  Findings: No rash.  Neurological:     Mental Status: She is alert.  Psychiatric:        Mood and Affect: Mood is not anxious or depressed.        Speech: Speech normal.        Behavior: Behavior normal. Behavior is cooperative.        Thought Content: Thought content normal.        Judgment: Judgment normal.           Assessment & Plan:

## 2018-08-04 NOTE — Assessment & Plan Note (Signed)
No clear sign of curent infection.. resolved.  remaining  effussion in middle ears.  Treat with steroid injection ( pt requested injection over oral for compliance)  Followed by topical nasal steriod spray and irrigation.

## 2018-08-04 NOTE — Addendum Note (Signed)
Addended by: Carter Kitten on: 08/04/2018 09:13 AM   Modules accepted: Orders

## 2018-08-04 NOTE — Assessment & Plan Note (Signed)
Improved control back on wellbutrin.

## 2018-08-09 ENCOUNTER — Telehealth: Payer: Self-pay

## 2018-08-09 DIAGNOSIS — T7840XD Allergy, unspecified, subsequent encounter: Secondary | ICD-10-CM

## 2018-08-09 MED ORDER — AZELASTINE HCL 0.1 % NA SOLN: 1.0000 | Freq: Two times a day (BID) | NASAL | 3 refills | Status: DC

## 2018-08-09 MED ORDER — AZELASTINE HCL 0.1 % NA SOLN: 1.0000 | Freq: Two times a day (BID) | NASAL | Status: DC

## 2018-08-09 MED ORDER — LEVOFLOXACIN 500 MG PO TABS: 500.0000 mg | ORAL_TABLET | Freq: Every day | ORAL | 0 refills | Status: DC

## 2018-08-09 NOTE — Telephone Encounter (Signed)
Pt left v/m; pt has been seen x 4 in last several months with ears being stopped up.pt starting to have drainage at back of throat and throat feels raw. Pt last seen 08/04/18; pt still has stopped up ears,prod cough with yellow green phlegm;no fever, wheezing or SOB; ears popping occasionally.Pt feels off balanced at times. Pt said sister had same symptoms and took Levofloxacin and azelastine Hcl spray and that helped pt's sister. Pt wants to know if could get Levofloxacin and azelastine [facility]. Pt does not want to schedule another appt.

## 2018-08-09 NOTE — Telephone Encounter (Signed)
Given progression of symptoms and length of course of illness.. broadening antibiotics is appropriate. Changing nasal spray to different med also appropriate will send in Rx s. Let pt know if not improving to follow up.

## 2018-08-10 ENCOUNTER — Other Ambulatory Visit: Payer: Self-pay | Admitting: Family Medicine

## 2018-08-10 NOTE — Telephone Encounter (Signed)
Patient notified as instructed by telephone.  She picked up the prescriptions last night.

## 2018-08-16 ENCOUNTER — Telehealth: Payer: Self-pay | Admitting: Family Medicine

## 2018-08-16 NOTE — Telephone Encounter (Signed)
Pt calling and stating she is still not feeling well and ears are still stopped up from 3.6.20 visit. Pt want call back from [place]

## 2018-08-17 NOTE — Telephone Encounter (Signed)
Please call and ask.. fever?, unilateral face pain?, ear pain? green copius mucus?

## 2018-08-17 NOTE — Telephone Encounter (Signed)
Spoke with Ms. Tiffany Orozco.  She states her main complaint is that her ears are stopped up and she has had a headache.  She finished the Levaquin yesterday.  She is using Flonase in the morning and azelastine at night.  She denies and fever or unilateral face pain.  She only had the green mucus once.  She just wants to know what she can do for her ears.

## 2018-08-17 NOTE — Telephone Encounter (Signed)
Tiffany Orozco notified as instructed by telephone.  She couldn't really say whether the steroid injection helped or not.  She is scheduled to be seen on 08/31/2018 for follow up so she will just keep doing the nasal sprays until her appointment next week.

## 2018-08-17 NOTE — Telephone Encounter (Signed)
Not much other than nasal saline and flonase. Did the steroid injection at the OV help at all? If so we could repeat a course of oral prednisone.

## 2018-08-29 ENCOUNTER — Other Ambulatory Visit (INDEPENDENT_AMBULATORY_CARE_PROVIDER_SITE_OTHER): Payer: Medicare Other

## 2018-08-29 ENCOUNTER — Other Ambulatory Visit: Payer: Medicare Other

## 2018-08-29 ENCOUNTER — Other Ambulatory Visit: Payer: Self-pay

## 2018-08-29 DIAGNOSIS — E038 Other specified hypothyroidism: Secondary | ICD-10-CM

## 2018-08-29 LAB — T4, FREE: Free T4: 0.82 ng/dL (ref 0.60–1.60)

## 2018-08-29 LAB — TSH: TSH: 2.37 u[IU]/mL (ref 0.35–4.50)

## 2018-08-29 LAB — T3, FREE: T3, Free: 4.7 pg/mL — ABNORMAL HIGH (ref 2.3–4.2)

## 2018-08-30 ENCOUNTER — Telehealth: Payer: Self-pay | Admitting: *Deleted

## 2018-08-30 NOTE — Telephone Encounter (Signed)
Mrs. Sassi notified as instructed by telephone.  Patient states understanding.

## 2018-08-30 NOTE — Telephone Encounter (Signed)
Antibiotics are probably not needed at this time as long as no fever or ear pain (fullness is okay) It will just take time for fluid to resolve. If not improving in 1-2 weeks.. she will need at least a phone visit/ facetime to determine what to do next.. referral. In office visit etc.

## 2018-08-30 NOTE — Telephone Encounter (Signed)
Called Tiffany Orozco to discuss thyroid lab results.  While on the phone, patient states she is still having ear fullness.  She finished the Levaquin.  She still continues to use the Flonase, Astelin and Nasal Saline.  She has also started taking Comtrex DM.  She is asking if Dr. Diona Browner would want to call her in another round of antibiotics.  I offered her a WebEx appointment but she does not have capability to do a WebEx visit.  I offered phone visit but patient would like a message sent to Dr. Diona Browner first for her recommendations.

## 2018-08-31 ENCOUNTER — Telehealth: Payer: Self-pay | Admitting: Family Medicine

## 2018-08-31 ENCOUNTER — Ambulatory Visit: Payer: Medicare Other | Admitting: Family Medicine

## 2018-08-31 DIAGNOSIS — E039 Hypothyroidism, unspecified: Secondary | ICD-10-CM

## 2018-08-31 MED ORDER — THYROID 60 MG PO TABS: 60.0000 mg | ORAL_TABLET | Freq: Every day | ORAL | 11 refills | Status: DC

## 2018-08-31 NOTE — Telephone Encounter (Signed)
-----   Message from Helene Shoe, LPN sent at 07/09/7679 11:06 AM EDT ----- Pt called back today and said she answered no to palpitations on 08/30/18 but that was not accurate;pt said she ignores things sometimes; pt said at night pt wakes up with fast and irregular heart beat; pt does not know how fast heart is beating. Pt also having slight h/a and lightheadedness today; pt thinks may be related to ear issues and bending over. Pt took BP today and BP was 152/85 P 84 after rest and reck BP 138/86 P 90. Pt wanted to call and give updated info. Pt request cb with further instructions after Dr Diona Browner reviews.

## 2018-08-31 NOTE — Addendum Note (Signed)
Addended by: Carter Kitten on: 08/31/2018 12:51 PM   Modules accepted: Orders

## 2018-08-31 NOTE — Telephone Encounter (Signed)
Tiffany Orozco notified as instructed by telephone.  She would like to alternate the 60 & 90 mg tablets.  Lab appointment scheduled for 10/05/2018 at 11:00 am to recheck thyroid labs.  Future order in Epic.  Medication list updated.

## 2018-08-31 NOTE — Telephone Encounter (Signed)
Call   She should lower armour thyroid to 60 mg daily. If she is hesitant to dropping this so much she can instead do every other day 60/90/60/90 etc. I have sent in new rx.  needs now to have labs for thyroid in 4-6 weeks.

## 2018-09-06 ENCOUNTER — Other Ambulatory Visit: Payer: Self-pay | Admitting: Family Medicine

## 2018-10-05 ENCOUNTER — Other Ambulatory Visit: Payer: Medicare Other

## 2018-10-05 ENCOUNTER — Other Ambulatory Visit (INDEPENDENT_AMBULATORY_CARE_PROVIDER_SITE_OTHER): Payer: Medicare Other

## 2018-10-05 DIAGNOSIS — E039 Hypothyroidism, unspecified: Secondary | ICD-10-CM

## 2018-10-06 LAB — TSH: TSH: 2.24 u[IU]/mL (ref 0.35–4.50)

## 2018-10-06 LAB — T3, FREE: T3, Free: 3.7 pg/mL (ref 2.3–4.2)

## 2018-10-06 LAB — T4, FREE: Free T4: 0.8 ng/dL (ref 0.60–1.60)

## 2018-10-10 ENCOUNTER — Other Ambulatory Visit: Payer: Self-pay | Admitting: Family Medicine

## 2018-10-19 ENCOUNTER — Ambulatory Visit: Payer: Medicare Other | Admitting: Podiatry

## 2018-10-26 ENCOUNTER — Ambulatory Visit (INDEPENDENT_AMBULATORY_CARE_PROVIDER_SITE_OTHER): Payer: Medicare Other

## 2018-10-26 ENCOUNTER — Other Ambulatory Visit: Payer: Self-pay | Admitting: Podiatry

## 2018-10-26 ENCOUNTER — Encounter: Payer: Self-pay | Admitting: Podiatry

## 2018-10-26 ENCOUNTER — Ambulatory Visit (INDEPENDENT_AMBULATORY_CARE_PROVIDER_SITE_OTHER): Payer: Medicare Other | Admitting: Podiatry

## 2018-10-26 ENCOUNTER — Other Ambulatory Visit: Payer: Self-pay

## 2018-10-26 VITALS — BP 133/73 | HR 85 | Temp 97.7°F

## 2018-10-26 DIAGNOSIS — M2041 Other hammer toe(s) (acquired), right foot: Secondary | ICD-10-CM

## 2018-10-26 DIAGNOSIS — M79674 Pain in right toe(s): Secondary | ICD-10-CM | POA: Diagnosis not present

## 2018-10-26 DIAGNOSIS — M7662 Achilles tendinitis, left leg: Secondary | ICD-10-CM | POA: Diagnosis not present

## 2018-10-26 DIAGNOSIS — M722 Plantar fascial fibromatosis: Secondary | ICD-10-CM

## 2018-10-26 MED ORDER — TRIAMCINOLONE ACETONIDE 10 MG/ML IJ SUSP
10.0000 mg | Freq: Once | INTRAMUSCULAR | Status: AC
  Administered 2018-10-26: 10 mg

## 2018-10-26 NOTE — Patient Instructions (Signed)

## 2018-10-28 NOTE — Progress Notes (Signed)
Subjective:   Patient ID: Tiffany Orozco, female   DOB: 69 y.o.   MRN: [id number]   HPI Patient states she is developed a lot of pain in the back of her left heel and mildly on the bottom and states that it is hard for her to be ambulatory or shoe gear.  Does not remember injury and also gets some discomfort of her right fifth toe as she had had surgery on it years ago.  Patient does not smoke and likes to be active   Review of Systems  All other systems reviewed and are negative.       Objective:  Physical Exam Vitals signs and nursing note reviewed.  Constitutional:      Appearance: She is well-developed.  Pulmonary:     Effort: Pulmonary effort is normal.  Musculoskeletal: Normal range of motion.  Skin:    General: Skin is warm.  Neurological:     Mental Status: She is alert.     Neurovascular status found to be intact muscle strength is adequate range of motion was within normal limits with moderate equinus condition bilateral.  Exquisite discomfort posterior lateral aspect left heel at the insertion Achilles with no central medial pain noted currently.  Patient is found to have good digital perfusion with moderate plantar heel pain and a keratotic lesion fifth digit right with rotation of the toe and what appears to be bone removal of the toe of a number of years ago       Assessment:  Acute Achilles tendinitis left lateral side with mild medial pain and plantar fasciitis of a mild nature along with digital deformity fifth right     Plan:  H&P condition discussed at great length and due to the acute nature I did do a careful injection after discussing chances for rupture.  I did do sterile prep injected the lateral Achilles insertion 3 mg dexamethasone Kenalog 5 mg Xylocaine and applied air fracture walker to completely immobilize.  I then for the fifth digit right discussed possibility for revisional arthroplasty or partial syndactylization procedure  X-ray indicates  plantar and posterior spur formation left and history of digital bone removal fifth digit right with arthroplasty of the lesser digits

## 2018-11-01 ENCOUNTER — Other Ambulatory Visit: Payer: Self-pay | Admitting: Family Medicine

## 2018-11-02 ENCOUNTER — Other Ambulatory Visit: Payer: Self-pay | Admitting: Family Medicine

## 2018-11-07 ENCOUNTER — Ambulatory Visit: Payer: Medicare Other | Admitting: Family Medicine

## 2018-11-07 ENCOUNTER — Ambulatory Visit (INDEPENDENT_AMBULATORY_CARE_PROVIDER_SITE_OTHER): Payer: Medicare Other | Admitting: Family Medicine

## 2018-11-07 ENCOUNTER — Encounter: Payer: Self-pay | Admitting: Family Medicine

## 2018-11-07 VITALS — Ht 59.0 in | Wt 184.0 lb

## 2018-11-07 DIAGNOSIS — Z1231 Encounter for screening mammogram for malignant neoplasm of breast: Secondary | ICD-10-CM | POA: Diagnosis not present

## 2018-11-07 DIAGNOSIS — F321 Major depressive disorder, single episode, moderate: Secondary | ICD-10-CM | POA: Diagnosis not present

## 2018-11-07 DIAGNOSIS — F411 Generalized anxiety disorder: Secondary | ICD-10-CM

## 2018-11-07 DIAGNOSIS — M8589 Other specified disorders of bone density and structure, multiple sites: Secondary | ICD-10-CM

## 2018-11-07 DIAGNOSIS — L570 Actinic keratosis: Secondary | ICD-10-CM | POA: Diagnosis not present

## 2018-11-07 DIAGNOSIS — R0609 Other forms of dyspnea: Secondary | ICD-10-CM | POA: Diagnosis not present

## 2018-11-07 DIAGNOSIS — E038 Other specified hypothyroidism: Secondary | ICD-10-CM | POA: Diagnosis not present

## 2018-11-07 DIAGNOSIS — R0600 Dyspnea, unspecified: Secondary | ICD-10-CM

## 2018-11-07 NOTE — Assessment & Plan Note (Signed)
Feeling much better on less thyroid medication armour 60

## 2018-11-07 NOTE — Assessment & Plan Note (Signed)
Excellent control on wellbutrin

## 2018-11-07 NOTE — Assessment & Plan Note (Signed)
Good control on wellbutrin. No SE.

## 2018-11-07 NOTE — Progress Notes (Addendum)
VIRTUAL VISIT Due to national recommendations of social distancing due to [place] 19, a virtual visit is felt to be most appropriate for this patient at this time.   I connected with the patient on 11/07/18 at  9:00 AM EDT by virtual telehealth platform and verified that I am speaking with the correct person using two identifiers.   Interactive audio and video telecommunications were attempted between this provider and patient, however failed, due to patient having technical difficulties OR patient did not have access to video capability.  We continued and completed visit with audio only.    I discussed the limitations, risks, security and privacy concerns of performing an evaluation and management service by  virtual telehealth platform and the availability of in person appointments. I also discussed with the patient that there may be a patient responsible charge related to this service. The patient expressed understanding and agreed to proceed.  Patient location: Home Provider Location: [facility] Participants: [name] and [name]   Chief Complaint  Patient presents with  . Follow-up    Mood    History of Present Illness: 68 year old female presents for follow up MDD and GAD,   She reports she is doing very well  back on wellbutrin. When she was sick she was not taking this and felt much worse.  She is sleeping well at night.Marland Kitchen getting 7 hours. She reports no anxiety, nervousness.  NO side effects.  Ear issues now improved. Depression screen [facility] 2/9 11/07/2018 12/14/2017 06/10/2017  Decreased Interest 0 0 3  Down, Depressed, Hopeless 0 1 2  PHQ - 2 Score 0 1 5  Altered sleeping 0 0 3  Tired, decreased energy 1 1 3   Change in appetite 0 1 3  Feeling bad or failure about yourself  0 1 1  Trouble concentrating 0 0 2  Moving slowly or fidgety/restless 0 1 2  Suicidal thoughts 0 0 0  PHQ-9 Score 1 5 19   Difficult doing work/chores Not difficult at all Not  difficult at all Somewhat difficult  Some recent data might be hidden   She feels taking thyroid med to 60 mg daily.. has helped a lot. Heart palpitations are improved.  COVID 19 screen No recent travel or known exposure to COVID19 The patient denies respiratory symptoms of COVID 19 at this time.  The importance of social distancing was discussed today.   Review of Systems  Constitutional: Negative for chills and fever.  HENT: Negative for congestion and ear pain.   Eyes: Negative for pain and redness.  Respiratory: Negative for cough and shortness of breath.   Cardiovascular: Negative for chest pain, palpitations and leg swelling.  Gastrointestinal: Negative for abdominal pain, blood in stool, constipation, diarrhea, nausea and vomiting.  Genitourinary: Negative for dysuria.  Musculoskeletal: Negative for falls and myalgias.  Skin: Negative for rash.  Neurological: Negative for dizziness.  Psychiatric/Behavioral: Negative for depression. The patient is not nervous/anxious.       Past Medical History:  Diagnosis Date  . Allergy   . Anxiety   . COPD (chronic obstructive pulmonary disease) ([place])   . DDD (degenerative disc disease), lumbar    mild  . Depression   . Female rectocele without uterine prolapse   . GERD (gastroesophageal reflux disease)   . Heart murmur   . Hyperlipidemia   . Hypertension   . Hypothyroidism   . IBS (irritable bowel syndrome)   . Influenza A 06/2015  . Obesity   .  Osteopenia   . Skin cancer of arm   . Skin cancer of face   . Stroke ([place]) 07/2010   no permenant deficits  . Thrombocytopenia ([place])     reports that she has been smoking cigarettes. She has a 31.00 pack-year smoking history. She has never used smokeless tobacco. She reports that she does not drink alcohol or use drugs.   Current Outpatient Medications:  .  albuterol (PROVENTIL HFA;VENTOLIN HFA) 108 (90 Base) MCG/ACT inhaler, Inhale 2 puffs into the lungs every 6 (six) hours as needed  for wheezing or shortness of breath., Disp: 18 g, Rfl: 11 .  amLODipine (NORVASC) 5 MG tablet, TAKE 1 TABLET BY MOUTH EVERY DAY, Disp: 90 tablet, Rfl: 1 .  ARMOUR THYROID 90 MG tablet, Take one tablet every other day alternating with 60 mg tablets, Disp: 45 tablet, Rfl: 1 .  azelastine (ASTELIN) 0.1 % nasal spray, PLACE 1 SPRAY INTO BOTH NOSTRILS 2 (TWO) TIMES DAILY. USE IN EACH NOSTRIL AS DIRECTED, Disp: 30 mL, Rfl: 2 .  buPROPion (WELLBUTRIN XL) 150 MG 24 hr tablet, TAKE 1 TABLET BY MOUTH EVERY DAY, Disp: 90 tablet, Rfl: 0 .  Cholecalciferol (VITAMIN D3) 2000 units TABS, Take 1 tablet by mouth daily., Disp: , Rfl:  .  Coenzyme Q10 (COQ10 PO), Take 1 capsule by mouth daily., Disp: , Rfl:  .  DEXILANT 60 MG capsule, TAKE 1 CAPSULE BY MOUTH EVERY DAY, Disp: 90 capsule, Rfl: 3 .  dipyridamole-aspirin (AGGRENOX) 200-25 MG 12hr capsule, TAKE 1 CAPSULE BY MOUTH 2 (TWO) TIMES DAILY., Disp: 180 capsule, Rfl: 1 .  ezetimibe (ZETIA) 10 MG tablet, TAKE 1 TABLET (10 MG TOTAL) BY MOUTH DAILY., Disp: 90 tablet, Rfl: 1 .  fluticasone (FLONASE) 50 MCG/ACT nasal spray, SPRAY 2 SPRAYS INTO EACH NOSTRIL EVERY DAY, Disp: 48 g, Rfl: 1 .  hydrochlorothiazide (HYDRODIURIL) 25 MG tablet, 1 tab po daily prn swelling, Disp: 30 tablet, Rfl: 11 .  Iodine, Kelp, (KELP PO), Take 600 mg by mouth daily., Disp: , Rfl:  .  polyethylene glycol powder (GLYCOLAX/MIRALAX) powder, MIX 1 CAPFUL (17 GRAMS) IN 8 OUNCES OF LIQUID AND DRINK DAILY AS NEEDED FOR CONSTIPATION., Disp: 527 g, Rfl: 0 .  thyroid (ARMOUR) 60 MG tablet, Take 60 mg by mouth every other day. Alternate with 90 mg tablets, Disp: , Rfl:    Observations/Objective: Height 4\' 11"  (1.499 m), weight 184 lb (83.5 kg).  Physical Exam  Physical Exam Constitutional:      General: The patient is not in acute distress. Pulmonary:     Effort: Pulmonary effort is normal. No respiratory distress.  Neurological:     Mental Status: The patient is alert and oriented to person,  place, and time.  Psychiatric:        Mood and Affect: Mood normal.        Behavior: Behavior normal.   Assessment and Plan Hypothyroidism Feeling much better on less thyroid medication armour 60  Depression, major, single episode, moderate (HCC) Excellent control on wellbutrin  Generalized anxiety disorder Good control on wellbutrin. No SE.  Dyspnea IMproved on less thyroid med.     I discussed the assessment and treatment plan with the patient. The patient was provided an opportunity to ask questions and all were answered. The patient agreed with the plan and demonstrated an understanding of the instructions.   The patient was advised to call back or seek an in-person evaluation if the symptoms worsen or if the condition fails to improve  as anticipated.  Total visit time 30 minutes, > 50% spent counseling and cordinating patients care.     [name], MD

## 2018-11-07 NOTE — Assessment & Plan Note (Signed)
IMproved on less thyroid med.

## 2018-11-09 ENCOUNTER — Telehealth: Payer: Self-pay | Admitting: Family Medicine

## 2018-11-09 DIAGNOSIS — Z1231 Encounter for screening mammogram for malignant neoplasm of breast: Secondary | ICD-10-CM

## 2018-11-09 NOTE — Telephone Encounter (Signed)
Thanks Done  

## 2018-11-09 NOTE — Telephone Encounter (Signed)
[id number] for 3D screening.

## 2018-11-09 NOTE — Addendum Note (Signed)
Addended byEliezer Lofts E on: 11/09/2018 12:20 PM   Modules accepted: Orders

## 2018-11-09 NOTE — Telephone Encounter (Signed)
-----   Message from [facility] sent at 11/08/2018  9:18 AM EDT ----- Regarding: 3D Mammo Order Dr. Diona Browner  Can you please change mammo order to 3D screening?  Thanks  Charmaine

## 2018-11-09 NOTE — Telephone Encounter (Signed)
I need an IMG number..the only  3 D screening mammogram that comes up is w/implants. Thanks!

## 2018-11-14 NOTE — Progress Notes (Signed)
Done.. so do I need to put time for ALL video fails? I thought we were told not to and  to just code as reg OV as we normally would.

## 2018-11-16 ENCOUNTER — Other Ambulatory Visit: Payer: Self-pay

## 2018-11-16 ENCOUNTER — Encounter: Payer: Self-pay | Admitting: Podiatry

## 2018-11-16 ENCOUNTER — Ambulatory Visit (INDEPENDENT_AMBULATORY_CARE_PROVIDER_SITE_OTHER): Payer: Medicare Other | Admitting: Podiatry

## 2018-11-16 VITALS — Temp 97.7°F

## 2018-11-16 DIAGNOSIS — M7662 Achilles tendinitis, left leg: Secondary | ICD-10-CM | POA: Diagnosis not present

## 2018-11-23 NOTE — Progress Notes (Signed)
Subjective:   Patient ID: Tiffany Orozco, female   DOB: 68 y.o.   MRN: [id number]   HPI Patient states her left foot is feeling quite a bit better with only mild discomfort if she is walk for too long   ROS      Objective:  Physical Exam  Neurovascular status intact with inflammation of the left lateral foot that is improved by about 70% with pain still present upon deep palpation     Assessment:  Acute tendinitis left with inflammation fluid buildup noted     Plan:  H&P condition reviewed and today I went ahead and did careful injection left lateral foot 3 mg Kenalog 5 mg Xylocaine advised on anti-inflammatories physical therapy ice therapy and reappoint to recheck again in the next several weeks

## 2018-12-08 ENCOUNTER — Other Ambulatory Visit: Payer: Self-pay | Admitting: *Deleted

## 2018-12-08 ENCOUNTER — Other Ambulatory Visit: Payer: Self-pay | Admitting: Family Medicine

## 2018-12-08 DIAGNOSIS — Z87891 Personal history of nicotine dependence: Secondary | ICD-10-CM

## 2018-12-08 DIAGNOSIS — F17210 Nicotine dependence, cigarettes, uncomplicated: Secondary | ICD-10-CM

## 2018-12-08 DIAGNOSIS — Z122 Encounter for screening for malignant neoplasm of respiratory organs: Secondary | ICD-10-CM

## 2018-12-18 ENCOUNTER — Encounter: Payer: Self-pay | Admitting: Family Medicine

## 2018-12-18 DIAGNOSIS — Z1231 Encounter for screening mammogram for malignant neoplasm of breast: Secondary | ICD-10-CM | POA: Diagnosis not present

## 2018-12-18 DIAGNOSIS — Z8701 Personal history of pneumonia (recurrent): Secondary | ICD-10-CM | POA: Diagnosis not present

## 2018-12-18 DIAGNOSIS — M8589 Other specified disorders of bone density and structure, multiple sites: Secondary | ICD-10-CM | POA: Diagnosis not present

## 2018-12-18 DIAGNOSIS — I639 Cerebral infarction, unspecified: Secondary | ICD-10-CM | POA: Diagnosis not present

## 2018-12-18 LAB — HM MAMMOGRAPHY

## 2018-12-20 ENCOUNTER — Other Ambulatory Visit: Payer: Medicare Other

## 2018-12-20 ENCOUNTER — Other Ambulatory Visit (INDEPENDENT_AMBULATORY_CARE_PROVIDER_SITE_OTHER): Payer: Medicare Other

## 2018-12-20 ENCOUNTER — Telehealth: Payer: Self-pay | Admitting: Family Medicine

## 2018-12-20 ENCOUNTER — Other Ambulatory Visit: Payer: Self-pay

## 2018-12-20 ENCOUNTER — Ambulatory Visit: Payer: Medicare Other

## 2018-12-20 DIAGNOSIS — E038 Other specified hypothyroidism: Secondary | ICD-10-CM

## 2018-12-20 DIAGNOSIS — E7800 Pure hypercholesterolemia, unspecified: Secondary | ICD-10-CM

## 2018-12-20 DIAGNOSIS — M8589 Other specified disorders of bone density and structure, multiple sites: Secondary | ICD-10-CM

## 2018-12-20 LAB — COMPREHENSIVE METABOLIC PANEL
ALT: 18 U/L (ref 0–35)
AST: 15 U/L (ref 0–37)
Albumin: 4 g/dL (ref 3.5–5.2)
Alkaline Phosphatase: 100 U/L (ref 39–117)
BUN: 6 mg/dL (ref 6–23)
CO2: 23 mEq/L (ref 19–32)
Calcium: 8.8 mg/dL (ref 8.4–10.5)
Chloride: 99 mEq/L (ref 96–112)
Creatinine, Ser: 0.67 mg/dL (ref 0.40–1.20)
GFR: 86.97 mL/min (ref >60.00)
Glucose, Bld: 107 mg/dL — ABNORMAL HIGH (ref 70–99)
Potassium: 4.4 mEq/L (ref 3.5–5.1)
Sodium: 130 mEq/L — ABNORMAL LOW (ref 135–145)
Total Bilirubin: 0.4 mg/dL (ref 0.2–1.2)
Total Protein: 6.3 g/dL (ref 6.0–8.3)

## 2018-12-20 LAB — LIPID PANEL
Cholesterol: 146 mg/dL (ref 0–200)
HDL: 37.8 mg/dL — ABNORMAL LOW (ref >39.00)
LDL Cholesterol: 76 mg/dL (ref 0–99)
NonHDL: 107.98
Total CHOL/HDL Ratio: 4
Triglycerides: 159 mg/dL — ABNORMAL HIGH (ref 0.0–149.0)
VLDL: 31.8 mg/dL (ref 0.0–40.0)

## 2018-12-20 LAB — VITAMIN D 25 HYDROXY (VIT D DEFICIENCY, FRACTURES): VITD: 34.89 ng/mL (ref 30.00–100.00)

## 2018-12-20 NOTE — Telephone Encounter (Signed)
Labs ordered.

## 2018-12-26 ENCOUNTER — Ambulatory Visit (INDEPENDENT_AMBULATORY_CARE_PROVIDER_SITE_OTHER): Payer: Medicare Other | Admitting: Family Medicine

## 2018-12-26 ENCOUNTER — Encounter: Payer: Self-pay | Admitting: Family Medicine

## 2018-12-26 ENCOUNTER — Other Ambulatory Visit: Payer: Self-pay

## 2018-12-26 VITALS — BP 130/68 | HR 80 | Temp 97.8°F | Ht 59.5 in | Wt 193.5 lb

## 2018-12-26 DIAGNOSIS — E871 Hypo-osmolality and hyponatremia: Secondary | ICD-10-CM | POA: Diagnosis not present

## 2018-12-26 DIAGNOSIS — E7800 Pure hypercholesterolemia, unspecified: Secondary | ICD-10-CM | POA: Diagnosis not present

## 2018-12-26 DIAGNOSIS — Z0000 Encounter for general adult medical examination without abnormal findings: Secondary | ICD-10-CM

## 2018-12-26 DIAGNOSIS — E039 Hypothyroidism, unspecified: Secondary | ICD-10-CM

## 2018-12-26 DIAGNOSIS — F321 Major depressive disorder, single episode, moderate: Secondary | ICD-10-CM | POA: Diagnosis not present

## 2018-12-26 DIAGNOSIS — I10 Essential (primary) hypertension: Secondary | ICD-10-CM | POA: Diagnosis not present

## 2018-12-26 NOTE — Assessment & Plan Note (Signed)
Good control on armour thyroid.

## 2018-12-26 NOTE — Patient Instructions (Addendum)
Get back to alternating armour thyroid 60/90.  Make sure wellbutrin daily. Increase sodium in diet.  Ms. Lienemann , Thank you for taking time to come for your Medicare Wellness Visit. I appreciate your ongoing commitment to your health goals. Please review the following plan we discussed and let me know if I can assist you in the future.    GOAL: Get back to regular exercise as able.    This is a list of the screening recommended for you and due dates:  Health Maintenance  Topic Date Due  . Mammogram  08/26/2018  . Flu Shot  12/30/2018  . Tetanus Vaccine  03/07/2019  . Colon Cancer Screening  05/05/2020  . DEXA scan (bone density measurement)  Completed  .  Hepatitis C: One time screening is recommended by Center for Disease Control  (CDC) for  adults born from [birth year] through 1967.   Completed  . Pneumonia vaccines  Completed

## 2018-12-26 NOTE — Assessment & Plan Note (Addendum)
Almost at goal given hx of CVA. Zetia, intolerant of statins.

## 2018-12-26 NOTE — Progress Notes (Signed)
Chief Complaint  Patient presents with  . Medicare Wellness    History of Present Illness: HPI  The patient presents for annual medicare wellness, complete physical and review of chronic health problems. He/She also has the following acute concerns today: none   Seeing Dr. Paulla Dolly for left ankle..tendonopathy.. supposed to be wearning boot on left foot.  I have personally reviewed the Medicare Annual Wellness questionnaire and have noted 1. The patient's medical and social history 2. Their use of alcohol, tobacco or illicit drugs 3. Their current medications and supplements 4. The patient's functional ability including ADL's, fall risks, home safety risks and hearing or visual             impairment. 5. Diet and physical activities 6. Evidence for depression or mood disorders 7.         Updated provider list Cognitive evaluation was performed and recorded on pt medicare questionnaire form. The patients weight, height, BMI and visual acuity have been recorded in the chart  I have made referrals, counseling and provided education to the patient based review of the above and I have provided the pt with a written personalized care plan for preventive services.   Documentation of this information was scanned into the electronic record under the media tab.   Advance directives and end of life planning reviewed in detail with patient and documented in EMR. Patient given handout on advance care directives if needed. HCPOA and living will updated if needed. Fall Risk  12/26/2018 12/14/2017 12/20/2016 11/03/2016 10/24/2015  Falls in the past year? 0 No No No No  Comment - - Emmi Telephone Survey: data to providers prior to load - -   MDD Doing better overall back on wellbutrin.   Office Visit from 12/26/2018 in [place] at [facility] Total Score  0       Hearing Screening   Method: Audiometry   125Hz  250Hz  500Hz  1000Hz  2000Hz  3000Hz  4000Hz  6000Hz  8000Hz   Right ear:   20 20  20  20     Left ear:   20 20 20  20       Visual Acuity Screening   Right eye Left eye Both eyes  Without correction:     With correction: 20/30 20/20 20/15    Elevated Cholesterol:  Almost at goal < 70 on zetia Lab Results  Component Value Date   CHOL 146 12/20/2018   HDL 37.80 (L) 12/20/2018   LDLCALC 76 12/20/2018   LDLDIRECT 103.0 09/02/2016   TRIG 159.0 (H) 12/20/2018   CHOLHDL 4 12/20/2018  Using medications without problems: Muscle aches:  Diet compliance: moderate Exercise: none Other complaints: HX of CVA  Wt Readings from Last 3 Encounters:  12/26/18 193 lb 8 oz (87.8 kg)  11/07/18 184 lb (83.5 kg)  08/04/18 186 lb 8 oz (84.6 kg)    Hypothyroid  Well controlled last check on armour thyroid Lab Results  Component Value Date   TSH 2.24 10/05/2018   Hypertension:  At goal on HCTZ and amlodipine   BP Readings from Last 3 Encounters:  12/26/18 130/68  10/26/18 133/73  08/04/18 122/68  Using medication without problems or lightheadedness:  none Chest pain with exertion:none Edema:none Short of breath:none Average home BPs: Other issues: Low sodium.. recurrent emesis ( none recent) has not been eating great and on HCTZ  Patient Care Team: Jinny Sanders, MD as PCP - General  COVID 19 screen No recent travel or known exposure to [place] The patient denies  respiratory symptoms of COVID 19 at this time.  The importance of social distancing was discussed today.   Review of Systems  Constitutional: Negative for chills and fever.  HENT: Negative for congestion and ear pain.   Eyes: Negative for pain and redness.  Respiratory: Negative for cough and shortness of breath.   Cardiovascular: Negative for chest pain, palpitations and leg swelling.  Gastrointestinal: Negative for abdominal pain, blood in stool, constipation, diarrhea, nausea and vomiting.  Genitourinary: Negative for dysuria.  Musculoskeletal: Negative for falls and myalgias.  Skin: Negative for rash.   Neurological: Negative for dizziness.  Psychiatric/Behavioral: Negative for depression. The patient is not nervous/anxious.       Past Medical History:  Diagnosis Date  . Allergy   . Anxiety   . COPD (chronic obstructive pulmonary disease) ([place])   . DDD (degenerative disc disease), lumbar    mild  . Depression   . Female rectocele without uterine prolapse   . GERD (gastroesophageal reflux disease)   . Heart murmur   . Hyperlipidemia   . Hypertension   . Hypothyroidism   . IBS (irritable bowel syndrome)   . Influenza A 06/2015  . Obesity   . Osteopenia   . Skin cancer of arm   . Skin cancer of face   . Stroke ([place]) 07/2010   no permenant deficits  . Thrombocytopenia ([place])     reports that she has been smoking cigarettes. She has a 31.00 pack-year smoking history. She has never used smokeless tobacco. She reports that she does not drink alcohol or use drugs.   Current Outpatient Medications:  .  albuterol (PROVENTIL HFA;VENTOLIN HFA) 108 (90 Base) MCG/ACT inhaler, Inhale 2 puffs into the lungs every 6 (six) hours as needed for wheezing or shortness of breath., Disp: 18 g, Rfl: 11 .  amLODipine (NORVASC) 5 MG tablet, TAKE 1 TABLET BY MOUTH EVERY DAY, Disp: 90 tablet, Rfl: 1 .  azelastine (ASTELIN) 0.1 % nasal spray, PLACE 1 SPRAY INTO BOTH NOSTRILS 2 (TWO) TIMES DAILY. USE IN EACH NOSTRIL AS DIRECTED, Disp: 30 mL, Rfl: 2 .  buPROPion (WELLBUTRIN XL) 150 MG 24 hr tablet, TAKE 1 TABLET BY MOUTH EVERY DAY, Disp: 90 tablet, Rfl: 0 .  Cholecalciferol (VITAMIN D3) 2000 units TABS, Take 1 tablet by mouth daily., Disp: , Rfl:  .  Coenzyme Q10 (COQ10 PO), Take 1 capsule by mouth daily., Disp: , Rfl:  .  DEXILANT 60 MG capsule, TAKE 1 CAPSULE BY MOUTH EVERY DAY, Disp: 90 capsule, Rfl: 3 .  dipyridamole-aspirin (AGGRENOX) 200-25 MG 12hr capsule, TAKE 1 CAPSULE BY MOUTH 2 (TWO) TIMES DAILY., Disp: 180 capsule, Rfl: 1 .  ezetimibe (ZETIA) 10 MG tablet, TAKE 1 TABLET (10 MG TOTAL) BY MOUTH  DAILY., Disp: 90 tablet, Rfl: 0 .  fluticasone (FLONASE) 50 MCG/ACT nasal spray, SPRAY 2 SPRAYS INTO EACH NOSTRIL EVERY DAY, Disp: 48 g, Rfl: 1 .  hydrochlorothiazide (HYDRODIURIL) 25 MG tablet, 1 tab po daily prn swelling, Disp: 30 tablet, Rfl: 11 .  Iodine, Kelp, (KELP PO), Take 600 mg by mouth daily., Disp: , Rfl:  .  polyethylene glycol powder (GLYCOLAX/MIRALAX) powder, MIX 1 CAPFUL (17 GRAMS) IN 8 OUNCES OF LIQUID AND DRINK DAILY AS NEEDED FOR CONSTIPATION., Disp: 527 g, Rfl: 0 .  thyroid (ARMOUR) 60 MG tablet, Take 60 mg by mouth every other day. Alternate with 90 mg tablets, Disp: , Rfl:  .  ARMOUR THYROID 90 MG tablet, Take one tablet every other day alternating with  60 mg tablets (Patient not taking: Reported on 12/26/2018), Disp: 45 tablet, Rfl: 1   Observations/Objective: Blood pressure 130/68, pulse 80, temperature 97.8 F (36.6 C), temperature source Oral, height 4' 11.5" (1.511 m), weight 193 lb 8 oz (87.8 kg), SpO2 96 %.  Physical Exam Constitutional:      General: She is not in acute distress.    Appearance: Normal appearance. She is well-developed. She is obese. She is not ill-appearing or toxic-appearing.  HENT:     Head: Normocephalic.     Right Ear: Hearing, tympanic membrane, ear canal and external ear normal.     Left Ear: Hearing, tympanic membrane, ear canal and external ear normal.     Nose: Nose normal.  Eyes:     General: Lids are normal. Lids are everted, no foreign bodies appreciated.     Conjunctiva/sclera: Conjunctivae normal.     Pupils: Pupils are equal, round, and reactive to light.  Neck:     Musculoskeletal: Normal range of motion and neck supple.     Thyroid: No thyroid mass or thyromegaly.     Vascular: No carotid bruit.     Trachea: Trachea normal.  Cardiovascular:     Rate and Rhythm: Normal rate and regular rhythm.     Heart sounds: Normal heart sounds, S1 normal and S2 normal. No murmur. No gallop.   Pulmonary:     Effort: Pulmonary effort is  normal. No respiratory distress.     Breath sounds: Normal breath sounds. No wheezing, rhonchi or rales.  Abdominal:     General: Bowel sounds are normal. There is no distension or abdominal bruit.     Palpations: Abdomen is soft. There is no fluid wave or mass.     Tenderness: There is no abdominal tenderness. There is no guarding or rebound.     Hernia: No hernia is present.  Lymphadenopathy:     Cervical: No cervical adenopathy.  Skin:    General: Skin is warm and dry.     Findings: No rash.  Neurological:     Mental Status: She is alert.     Cranial Nerves: No cranial nerve deficit.     Sensory: No sensory deficit.  Psychiatric:        Mood and Affect: Mood is not anxious or depressed.        Speech: Speech normal.        Behavior: Behavior normal. Behavior is cooperative.        Judgment: Judgment normal.      Assessment and Plan  Vaccines: Uptodate  PAP/DVE:not indicated No current symptoms of prolapsed uterus. Mammo: nml3/2018, pending DXA: osteopenia3/2018.. no significant change plan repeat in 2023 Colon: 04/2010, Dr. [name], nml, repeat in 10 years  Smoker:precontemplative>30pack year history, [place] chest 10/29/2017: Lung-RADS 2, benign appearance or behavior. nml EKG in 07/2013.   The patient's preventative maintenance and recommended screening tests for an annual wellness exam were reviewed in full today. Brought up to date unless services declined.  Counselled on the importance of diet, exercise, and its role in overall health and mortality. The patient's FH and SH was reviewed, including their home life, tobacco status, and drug and alcohol status.     [name], MD

## 2018-12-26 NOTE — Assessment & Plan Note (Signed)
At goal on HCTZ and amlodipine

## 2018-12-26 NOTE — Assessment & Plan Note (Signed)
Recurrent emesis likely contributing.  On HCTZ limit to prn swelling only.  No sodium restriction and decrease water intake some.

## 2018-12-26 NOTE — Assessment & Plan Note (Signed)
Well controlled. Continue current medication.  

## 2019-01-18 ENCOUNTER — Ambulatory Visit (INDEPENDENT_AMBULATORY_CARE_PROVIDER_SITE_OTHER)
Admission: RE | Admit: 2019-01-18 | Discharge: 2019-01-18 | Disposition: A | Discharge status: Home / Self Care | Payer: Medicare Other | Source: Ambulatory Visit | Attending: Acute Care | Admitting: Acute Care

## 2019-01-18 ENCOUNTER — Other Ambulatory Visit: Payer: Self-pay

## 2019-01-18 DIAGNOSIS — F17210 Nicotine dependence, cigarettes, uncomplicated: Secondary | ICD-10-CM

## 2019-01-18 DIAGNOSIS — Z122 Encounter for screening for malignant neoplasm of respiratory organs: Secondary | ICD-10-CM

## 2019-01-18 DIAGNOSIS — Z87891 Personal history of nicotine dependence: Secondary | ICD-10-CM

## 2019-01-19 ENCOUNTER — Other Ambulatory Visit: Payer: Self-pay | Admitting: *Deleted

## 2019-01-19 DIAGNOSIS — Z122 Encounter for screening for malignant neoplasm of respiratory organs: Secondary | ICD-10-CM

## 2019-01-19 DIAGNOSIS — Z87891 Personal history of nicotine dependence: Secondary | ICD-10-CM

## 2019-01-19 DIAGNOSIS — F17210 Nicotine dependence, cigarettes, uncomplicated: Secondary | ICD-10-CM

## 2019-01-24 ENCOUNTER — Other Ambulatory Visit: Payer: Self-pay | Admitting: Family Medicine

## 2019-02-09 ENCOUNTER — Telehealth: Payer: Self-pay | Admitting: Family Medicine

## 2019-02-09 ENCOUNTER — Encounter: Payer: Self-pay | Admitting: Family Medicine

## 2019-02-09 NOTE — Telephone Encounter (Signed)
I have chest CT and bone density... no mammogram

## 2019-02-09 NOTE — Telephone Encounter (Signed)
Patient would like to know if you ever received the results from her Mammogram, bone density and chest xray. She had contacted those office before to have them resend these over to you but she never heard back so she wasn't sure if they actually tried resending them

## 2019-02-09 NOTE — Telephone Encounter (Signed)
Requested mammogram results from [place].  Received.  Results of bone density, chest CT and mammogram discussed with Mrs. Tiffany Orozco.

## 2019-03-03 ENCOUNTER — Other Ambulatory Visit: Payer: Self-pay | Admitting: Family Medicine

## 2019-03-04 ENCOUNTER — Other Ambulatory Visit: Payer: Self-pay | Admitting: Family Medicine

## 2019-03-09 ENCOUNTER — Other Ambulatory Visit: Payer: Self-pay | Admitting: Family Medicine

## 2019-03-13 ENCOUNTER — Other Ambulatory Visit: Payer: Self-pay | Admitting: Family Medicine

## 2019-03-27 ENCOUNTER — Telehealth: Payer: Self-pay | Admitting: *Deleted

## 2019-03-27 ENCOUNTER — Ambulatory Visit (INDEPENDENT_AMBULATORY_CARE_PROVIDER_SITE_OTHER): Payer: Medicare Other

## 2019-03-27 DIAGNOSIS — Z23 Encounter for immunization: Secondary | ICD-10-CM

## 2019-03-27 NOTE — Telephone Encounter (Signed)
I spoke with pt; pt got the high dose flu shot today around 11 AM and around 12:30 pt noticed a h/a and now pain level for h/a is 9-10 with some pressure behind eyes but no vision problems. Pt has lightheadedness when moves around and SOB when bending over at dryer. No weakness in extremities.Pt sat down and SOB went away and pt is not lightheaded when sitting. Pt said she is going to rest this afternoon. No swelling of lips, mouth, tongue or throat and no difficulty breathing or swallowing. Dr Diona Browner OK to take Tylenol and if h/a does no improve or pt develops CP,SOB or dizziness pt needs to go to ED. Pt voiced understanding of ED precautions and will try Tylenol 500 mg taking 2 caps. Pt will cb with update if needed. FYI to Dr Diona Browner.

## 2019-03-27 NOTE — Telephone Encounter (Signed)
Pt said she received her HD f/u vaccine today and at 12:30 developed a severe Headache, it hurts to move around, pt wants to know what med can she takes to help it, its a  9/10 on pain scale, no confusion, no weakness, pt is behind eyes and top of head and having neck pain, given severity of HA transferred to [facility] to eval

## 2019-03-27 NOTE — Telephone Encounter (Signed)
Noted. Agree with recommendations.  

## 2019-04-03 ENCOUNTER — Encounter: Payer: Self-pay | Admitting: Family Medicine

## 2019-04-03 ENCOUNTER — Ambulatory Visit (INDEPENDENT_AMBULATORY_CARE_PROVIDER_SITE_OTHER): Payer: Medicare Other | Admitting: Family Medicine

## 2019-04-03 ENCOUNTER — Other Ambulatory Visit: Payer: Self-pay

## 2019-04-03 VITALS — BP 156/92 | HR 77 | Temp 97.9°F | Ht 59.5 in | Wt 191.8 lb

## 2019-04-03 DIAGNOSIS — H833X3 Noise effects on inner ear, bilateral: Secondary | ICD-10-CM | POA: Diagnosis not present
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
## 2019-04-03 NOTE — Assessment & Plan Note (Signed)
No eardrum rupture. Protect ear.. can take  2 week to resolve.. or may be permanent. Calll for ENT referal if npot resolving with time.  Can use  Extra strength tylenol for pain

## 2019-04-13 ENCOUNTER — Telehealth: Payer: Self-pay | Admitting: Family Medicine

## 2019-04-13 DIAGNOSIS — H833X3 Noise effects on inner ear, bilateral: Secondary | ICD-10-CM

## 2019-04-13 NOTE — Telephone Encounter (Signed)
Referral made 

## 2019-04-13 NOTE — Telephone Encounter (Signed)
Patient was in the office on 11/3 for trouble with her ears. She was advised to call back if thing have not improved so she can be referred to an ENT office  She stated that it is not improving and would like the referral sent over so she can see someone at the ENT office

## 2019-04-16 NOTE — Telephone Encounter (Signed)
Pt said ears are no better and wants to know status of referral. I advised pt I would send note to [facility] and request referral started. UC precautions given and pt voiced understanding. FYI to [facility].

## 2019-04-17 DIAGNOSIS — H6983 Other specified disorders of Eustachian tube, bilateral: Secondary | ICD-10-CM | POA: Diagnosis not present

## 2019-04-17 DIAGNOSIS — H833X3 Noise effects on inner ear, bilateral: Secondary | ICD-10-CM | POA: Diagnosis not present

## 2019-04-17 DIAGNOSIS — H903 Sensorineural hearing loss, bilateral: Secondary | ICD-10-CM | POA: Diagnosis not present

## 2019-04-17 DIAGNOSIS — H9313 Tinnitus, bilateral: Secondary | ICD-10-CM | POA: Diagnosis not present

## 2019-04-25 ENCOUNTER — Other Ambulatory Visit: Payer: Self-pay

## 2019-05-03 ENCOUNTER — Telehealth: Payer: Self-pay | Admitting: Family Medicine

## 2019-05-03 NOTE — Telephone Encounter (Signed)
Refill for Dexilantt has already been sent to her pharmacy today.

## 2019-05-03 NOTE — Telephone Encounter (Signed)
Patient would like this sent as soon as possible She is completely out of medication

## 2019-05-22 ENCOUNTER — Telehealth: Payer: Self-pay
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
## 2019-05-23 ENCOUNTER — Ambulatory Visit (INDEPENDENT_AMBULATORY_CARE_PROVIDER_SITE_OTHER): Payer: Medicare Other | Admitting: Family Medicine

## 2019-05-23 ENCOUNTER — Encounter: Payer: Self-pay | Admitting: Family Medicine

## 2019-05-23 ENCOUNTER — Other Ambulatory Visit: Payer: Self-pay

## 2019-05-23 DIAGNOSIS — J0100 Acute maxillary sinusitis, unspecified: Secondary | ICD-10-CM | POA: Diagnosis not present

## 2019-05-23 MED ORDER — AMOXICILLIN 875 MG PO TABS: 875.0000 mg | ORAL_TABLET | Freq: Two times a day (BID) | ORAL | 0 refills | Status: AC
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
## 2019-05-23 NOTE — Telephone Encounter (Signed)
Reasonable plan of care and I will see the patient today

## 2019-06-24 ENCOUNTER — Other Ambulatory Visit: Payer: Self-pay | Admitting: Family Medicine

## 2019-07-27 ENCOUNTER — Ambulatory Visit: Payer: Medicare Other | Attending: Internal Medicine

## 2019-07-27 DIAGNOSIS — Z23 Encounter for immunization: Secondary | ICD-10-CM | POA: Insufficient documentation

## 2019-07-27 NOTE — Progress Notes (Signed)
   Covid-19 Vaccination Clinic  Name:  [name]    MRN: [id number] DOB: [birth year]  07/27/2019  [name] was observed post Covid-19 immunization for 15 minutes without incidence. She was provided with Vaccine Information Sheet and instruction to access the V-Safe system.   [name] was instructed to call 911 with any severe reactions post vaccine: Marland Kitchen Difficulty breathing  . Swelling of your face and throat  . A fast heartbeat  . A bad rash all over your body  . Dizziness and weakness    Immunizations Administered    Name Date Dose VIS Date Route   Pfizer COVID-19 Vaccine 07/27/2019 11:28 AM 0.3 mL 05/11/2019 Intramuscular   Manufacturer: [place]   Lot: [id number]   [place]: [id number]

## 2019-08-21 ENCOUNTER — Ambulatory Visit: Payer: Medicare Other | Attending: Internal Medicine

## 2019-08-21 DIAGNOSIS — Z23 Encounter for immunization: Secondary | ICD-10-CM

## 2019-08-21 NOTE — Progress Notes (Signed)
   Covid-19 Vaccination Clinic  Name:  GENEVE PELLICANE    MRN: [id number] DOB: 1951/03/23  08/21/2019  Ms. Ephraim was observed post Covid-19 immunization for 15 minutes without incident. She was provided with Vaccine Information Sheet and instruction to access the V-Safe system.   Ms. Omoto was instructed to call 911 with any severe reactions post vaccine: Marland Kitchen Difficulty breathing  . Swelling of face and throat  . A fast heartbeat  . A bad rash all over body  . Dizziness and weakness   Immunizations Administered    Name Date Dose VIS Date Route   Pfizer COVID-19 Vaccine 08/21/2019  3:54 PM 0.3 mL 05/11/2019 Intramuscular   Manufacturer: [place]   Lot: [id number]   [place]: [id number]

## 2019-09-01 ENCOUNTER — Other Ambulatory Visit: Payer: Self-pay | Admitting: Family Medicine

## 2019-09-03 ENCOUNTER — Telehealth: Payer: Self-pay

## 2019-09-03 NOTE — Telephone Encounter (Signed)
Contacted pt and she reports she has already made an apt to see her dentist. Advised if she needs apt with Dr. Diona Browner to contact the office. Pt verbalized understanding.

## 2019-09-03 NOTE — Telephone Encounter (Signed)
Noted  

## 2019-09-03 NOTE — Telephone Encounter (Signed)
[facility] Night - Client TELEPHONE ADVICE RECORD [facility] Patient Name: Tiffany Orozco Gender: Female DOB: [birth year] Age: 69 Y 10 M 20 D Return Phone Number: [phone] (Primary) Address: City/State/Zip: [place] [address] Client [facility] Night - Client Client Site [place] Physician [name] - MD Contact Type Call Who Is Calling Patient / Member / Family / Caregiver Call Type Triage / Clinical Relationship To Patient Self Return Phone Number [phone] (Primary) Chief Complaint Dental / Gum Symptom Reason for Call Symptomatic / Request for [place] states she has started grinding her teeth at night. Caller states the pain is so bad now that she is not able to sleep and her ear feels like it is congested. Caller states she is not able to touch her teeth together without pain. Translation No Nurse Assessment Nurse: Jimmye Norman, RN, Olin Hauser Date/Time Eilene Ghazi Time): 08/31/2019 10:20:04 AM Confirm and document reason for call. If symptomatic, describe symptoms. ---caller states having bottom left gum and tooth pain going to left ear and left jaw bone started early in week with eating popcorn. Thinks she has a low grade temperature, and also has a headache. Pain 8/10 taking tylenol. grinding teeth with sleep Has the patient had close contact with a person known or suspected to have the novel coronavirus illness OR traveled / lives in area with major community spread (including international travel) in the last 14 days from the onset of symptoms? * If Asymptomatic, screen for exposure and travel within the last 14 days. ---No Does the patient have any new or worsening symptoms? ---Yes Will a triage be completed? ---Yes Related visit to physician within the last 2 weeks? ---No Does the PT have any chronic conditions? (i.e. diabetes, asthma, this includes  High risk factors for pregnancy, etc.) ---Yes List chronic conditions. ---thyroid issues, obese, stroke 2020 Is this a behavioral health or substance abuse call? ---No Guidelines Guideline Title Affirmed Question Affirmed Notes Nurse Date/Time (Eastern Time) Mouth Pain [1] Face is swollen AND [2] fever Ruby Cola 08/31/2019 10:24:20 AMPLEASE NOTE: All timestamps contained within this report are represented as [place] Standard Time. CONFIDENTIALTY NOTICE: This fax transmission is intended only for the addressee. It contains information that is legally privileged, confidential or otherwise protected from use or disclosure. If you are not the intended recipient, you are strictly prohibited from reviewing, disclosing, copying using or disseminating any of this information or taking any action in reliance on or regarding this information. If you have received this fax in error, please notify us immediately by telephone so that we can arrange for its return to [place]. Phone: [phone], Toll-Free: [phone], Fax: [phone] Page: 2 of 2 Call Id: [id number] [place]. Time Eilene Ghazi Time) Disposition Final User 08/31/2019 10:33:53 AM Go to ED Now (or PCP triage) Yes Jimmye Norman, RN, Otho Najjar Disagree/Comply Disagree Caller Understands Yes PreDisposition Did not know what to do Care Advice Given Per Guideline PAIN AND FEVER MEDICINES: * IBUPROFEN (E.G., MOTRIN, ADVIL): Take 400 mg (two 200 mg pills) by mouth every 6 hours. The most you should take each day is 1,200 mg (six 200 mg pills), unless your doctor has told you to take more. CARE ADVICE given per Mouth Pain (Adult) guideline. Comments User: Tempie Donning, RN Date/Time Eilene Ghazi Time): 08/31/2019 10:33:23 AM since outcome was to be evaluated, nurse did not call in mortin (standing order). nurse told pt she could take Mortin OTC. User: Tempie Donning,  RN Date/Time Eilene Ghazi Time): 08/31/2019 10:34:38 AM caller disconnected before nurse  could tell her about Sat. Virtual visits. Referrals GO TO FACILITY REFUSED

## 2019-09-07 ENCOUNTER — Encounter (HOSPITAL_COMMUNITY): Payer: Self-pay

## 2019-09-07 ENCOUNTER — Ambulatory Visit (HOSPITAL_COMMUNITY)
Admission: EM | Admit: 2019-09-07 | Discharge: 2019-09-07 | Disposition: A | Discharge status: Home / Self Care | Payer: Medicare Other | Attending: Urgent Care | Admitting: Urgent Care

## 2019-09-07 ENCOUNTER — Other Ambulatory Visit: Payer: Self-pay

## 2019-09-07 DIAGNOSIS — K029 Dental caries, unspecified: Secondary | ICD-10-CM

## 2019-09-07 DIAGNOSIS — K089 Disorder of teeth and supporting structures, unspecified: Secondary | ICD-10-CM

## 2019-09-07 DIAGNOSIS — K047 Periapical abscess without sinus: Secondary | ICD-10-CM

## 2019-09-07 MED ORDER — AMOXICILLIN-POT CLAVULANATE 875-125 MG PO TABS: 1.0000 | ORAL_TABLET | Freq: Two times a day (BID) | ORAL | 0 refills | Status: DC

## 2019-09-07 MED ORDER — TRAMADOL HCL 50 MG PO TABS: 50.0000 mg | ORAL_TABLET | Freq: Four times a day (QID) | ORAL | 0 refills | Status: DC | PRN

## 2019-09-07 NOTE — Telephone Encounter (Signed)
Patient called back She stated she has called her dentist and has left several messages but no one has returned her call. Patient said pain is now radiating up into her ear and patient stated that it is a very sharpe pain. Patient was advised that we have no appointments opened in the office today. She stated she really needed to be seen since the pain is moving to her ear. She is unable to eat or drink hot or cold liquids because it is so painful. Advised UC to for eval since the pain in radiating into her ear. Patient voiced understanding and will go to [facility] health urgent care near [facility] [place]

## 2019-09-07 NOTE — ED Triage Notes (Addendum)
Pt c/o 10/10 left lower tooth pain and the pain is in her left earx1 wk. Pt states trouble chewing on that side

## 2019-09-07 NOTE — ED Provider Notes (Signed)
[facility]   MRN: [id number] DOB: May 08, 1951  Subjective:   Tiffany Orozco is a 69 y.o. female presenting for 1 week hx of acute onset persistent and worsening left-sided lower and upper dental pain.  Patient states that symptoms started after she ate popcorn and she did get a kernel that she thought was there out.  She admits that in the past year she has had worsening dental issues but was not able to get in with her dentist.  The pain is severe now radiates to the left side of her face to her ear.  Feels like she has facial swelling of the left side.  No current facility-administered medications for this encounter.  Current Outpatient Medications:  .  albuterol (PROVENTIL HFA;VENTOLIN HFA) 108 (90 Base) MCG/ACT inhaler, Inhale 2 puffs into the lungs every 6 (six) hours as needed for wheezing or shortness of breath., Disp: 18 g, Rfl: 11 .  amLODipine (NORVASC) 5 MG tablet, TAKE 1 TABLET BY MOUTH EVERY DAY, Disp: 90 tablet, Rfl: 0 .  ARMOUR THYROID 60 MG tablet, TAKE 1 TABLET (60 MG TOTAL) BY MOUTH DAILY BEFORE BREAKFAST., Disp: 90 tablet, Rfl: 0 .  ARMOUR THYROID 90 MG tablet, TAKE ONE TABLET EVERY OTHER DAY ALTERNATING WITH 60 MG TABLETS, Disp: 45 tablet, Rfl: 0 .  azelastine (ASTELIN) 0.1 % nasal spray, PLACE 1 SPRAY INTO BOTH NOSTRILS 2 (TWO) TIMES DAILY., Disp: 30 mL, Rfl: 5 .  buPROPion (WELLBUTRIN XL) 150 MG 24 hr tablet, TAKE 1 TABLET BY MOUTH EVERY DAY, Disp: 90 tablet, Rfl: 1 .  Cholecalciferol (VITAMIN D3) 2000 units TABS, Take 1 tablet by mouth daily., Disp: , Rfl:  .  Coenzyme Q10 (COQ10 PO), Take 1 capsule by mouth daily., Disp: , Rfl:  .  DEXILANT 60 MG capsule, TAKE 1 CAPSULE BY MOUTH EVERY DAY, Disp: 90 capsule, Rfl: 3 .  dipyridamole-aspirin (AGGRENOX) 200-25 MG 12hr capsule, TAKE 1 CAPSULE BY MOUTH TWICE A DAY, Disp: 180 capsule, Rfl: 0 .  ezetimibe (ZETIA) 10 MG tablet, TAKE 1 TABLET (10 MG TOTAL) BY MOUTH DAILY., Disp: 90 tablet, Rfl: 3 .  fluticasone  (FLONASE) 50 MCG/ACT nasal spray, SPRAY 2 SPRAYS INTO EACH NOSTRIL EVERY DAY, Disp: 48 g, Rfl: 1 .  hydrochlorothiazide (HYDRODIURIL) 25 MG tablet, 1 tab po daily prn swelling, Disp: 30 tablet, Rfl: 11 .  Iodine, Kelp, (KELP PO), Take 600 mg by mouth daily., Disp: , Rfl:  .  polyethylene glycol powder (GLYCOLAX/MIRALAX) powder, MIX 1 CAPFUL (17 GRAMS) IN 8 OUNCES OF LIQUID AND DRINK DAILY AS NEEDED FOR CONSTIPATION., Disp: 527 g, Rfl: 0   Allergies  Allergen Reactions  . Lisinopril     Shortness of breath.  . Niacin     REACTION: dizziness  . Welchol [Colesevelam Hcl] Other (See Comments)    Diarrhea,Nausea, swelling in face  . Augmentin [Amoxicillin-Pot Clavulanate]     Nauseated. GI upset with taking.  . Codeine     keeps patient awake  . Oxycodone Hcl     Patient states this medication makes her hyper    Past Medical History:  Diagnosis Date  . Allergy   . Anxiety   . COPD (chronic obstructive pulmonary disease) ([place])   . DDD (degenerative disc disease), lumbar    mild  . Depression   . Female rectocele without uterine prolapse   . GERD (gastroesophageal reflux disease)   . Heart murmur   . Hyperlipidemia   . Hypertension   . Hypothyroidism   .  IBS (irritable bowel syndrome)   . Influenza A 06/2015  . Obesity   . Osteopenia   . Skin cancer of arm   . Skin cancer of face   . Stroke ([place]) 07/2010   no permenant deficits  . Thrombocytopenia ([place])      Past Surgical History:  Procedure Laterality Date  . APPENDECTOMY    . CHOLECYSTECTOMY  2004  . EMGs  7/04   atms and wrists neg  . ESOPHAGEAL MANOMETRY N/A 03/17/2015   Procedure: ESOPHAGEAL MANOMETRY (EM);  Surgeon: Manus Gunning, MD;  Location: [facility];  Service: Gastroenterology;  Laterality: N/A;  . FOOT SURGERY  03/31/2009   R foot  . FOOT SURGERY  2008, 2009, 2010   right foot   . TUBAL LIGATION  1975    Family History  Problem Relation Age of Onset  . Skin cancer Father   . Hypertension  Mother   . Cancer Brother        lung  . Cancer Sister        colon  . Cancer Sister        ovarian and esophageal    Social History   Tobacco Use  . Smoking status: Current Every Day Smoker    Packs/day: 1.00    Years: 31.00    Pack years: 31.00    Types: Cigarettes  . Smokeless tobacco: Never Used  . Tobacco comment: States she is not smoking during current illness 05/29/18 sb  Substance Use Topics  . Alcohol use: No    Alcohol/week: 0.0 standard drinks  . Drug use: No    ROS   Objective:   Vitals: BP (!) 156/72   Pulse 90   Temp 98.2 F (36.8 C) (Oral)   Resp 18   Ht 4\' 9"  (1.448 m)   Wt 180 lb (81.6 kg)   SpO2 98%   BMI 38.95 kg/m   Physical Exam Constitutional:      General: She is not in acute distress.    Appearance: Normal appearance. She is well-developed. She is not ill-appearing, toxic-appearing or diaphoretic.  HENT:     Head: Normocephalic and atraumatic.      Left Ear: Tympanic membrane, ear canal and external ear normal. There is no impacted cerumen.     Nose: Nose normal.     Mouth/Throat:     Mouth: Mucous membranes are moist.     Pharynx: Oropharynx is clear.   Eyes:     General: No scleral icterus.       Right eye: No discharge.        Left eye: No discharge.     Extraocular Movements: Extraocular movements intact.     Pupils: Pupils are equal, round, and reactive to light.  Cardiovascular:     Rate and Rhythm: Normal rate.  Pulmonary:     Effort: Pulmonary effort is normal.  Skin:    General: Skin is warm and dry.  Neurological:     General: No focal deficit present.     Mental Status: She is alert and oriented to person, place, and time.  Psychiatric:        Mood and Affect: Mood normal.        Behavior: Behavior normal.        Thought Content: Thought content normal.        Judgment: Judgment normal.      Assessment and Plan :   1. Dental infection   2. Pain due to  dental caries   3. Poor dentition     Start  Augmentin for dental infection/abscess, schedule Tylenol for pain control, use tramadol for breakthrough pain. Emphasized need for dental surgeon consult. Counseled patient on potential for adverse effects with medications prescribed/recommended today, strict ER and return-to-clinic precautions discussed, patient verbalized understanding.    Jaynee Eagles, [place] 09/07/19 1858

## 2019-09-07 NOTE — Discharge Instructions (Addendum)
Please schedule Tylenol at 500 mg - 650 mg once every 6 hours as needed for aches and pains.  If you still have pain despite taking Tylenol regularly, this is breakthrough pain.  You can use tramadol once every 6 hours for this.  Once your pain is better controlled, switch back to just Tylenol.    [facility] [phone] extension [phone]  Dr. Donn Pierini [phone] [place] [phone] [address] [facility] [place].  [facility] [phone] extension [id number] [address], [place], [place], [address] First come first serve for the first 10 clients.  May do simple extractions only, no wisdom teeth or surgery.  You may try the second for Thursday of the month starting at [employer] You may call the school to see if they are still helping to provide dental care for emergent cases.

## 2019-09-09 ENCOUNTER — Telehealth (HOSPITAL_COMMUNITY): Payer: Self-pay | Admitting: Urgent Care

## 2019-09-09 ENCOUNTER — Telehealth (HOSPITAL_COMMUNITY): Payer: Self-pay

## 2019-09-09 MED ORDER — ONDANSETRON 8 MG PO TBDP: 8.0000 mg | ORAL_TABLET | Freq: Three times a day (TID) | ORAL | 0 refills | Status: DC | PRN

## 2019-09-09 NOTE — Telephone Encounter (Signed)
Pt states she vomited after she took the Amoxicillin-clavulanate. Provider reviewed pt charts and sent ondansetron. Pt was told to have the medication with food and start taking the ondansetron. Pt was told if thsi is not helping, she needs to call back and the provider will change the antibiotic, pt understood and agree.

## 2019-09-09 NOTE — Telephone Encounter (Signed)
Patient having n/v with amoxicillin. Will use Zofran. If this does not help, switch antibiotic to clindamycin.

## 2019-09-10 NOTE — Telephone Encounter (Signed)
Noted  

## 2019-09-23 ENCOUNTER — Other Ambulatory Visit: Payer: Self-pay | Admitting: Family Medicine

## 2019-11-06 ENCOUNTER — Telehealth: Payer: Self-pay | Admitting: Gastroenterology

## 2019-11-06 ENCOUNTER — Encounter: Payer: Self-pay | Admitting: Physician Assistant

## 2019-11-06 NOTE — Telephone Encounter (Signed)
Okay with me if okay with Dr. Loletha Carrow

## 2019-11-06 NOTE — Telephone Encounter (Signed)
Hi Dr. [name], this patient would like to continue her Gi care with Dr. Loletha Carrow because her husband is a patient of his. She last saw you in 2017. Are you ok with the switch? Thank you.

## 2019-11-07 ENCOUNTER — Encounter: Payer: Self-pay | Admitting: Gastroenterology

## 2019-11-07 NOTE — Telephone Encounter (Signed)
New consult sch on 7/27 at 10:20am with Dr. Loletha Carrow.

## 2019-11-07 NOTE — Telephone Encounter (Signed)
Yes, of course, thanks for asking.  New to me and last seen in 2017, so would need to go in a new patient office slot for me.

## 2019-11-20 ENCOUNTER — Telehealth: Payer: Self-pay | Admitting: Family Medicine

## 2019-11-20 DIAGNOSIS — E039 Hypothyroidism, unspecified: Secondary | ICD-10-CM

## 2019-11-20 NOTE — Telephone Encounter (Signed)
Pt states that she found out that her elevated HR is possibly not due to her Thyroid medication but due to type of coffee that she was drinking - it was a stronger coffee and when she switched back to her normal brand of coffee her HR leveled out.  Pt is wanting to continue on her Thyroid medication at 90mg  daily. Wants an Rx sent to the pharmacy for this.   Please advise, thanks.

## 2019-11-21 NOTE — Telephone Encounter (Signed)
Okay to make cahnge as requested.. send in rx for 3 months to pharmacy. Have her come back in in 4 weeks to recheck TSH.

## 2019-11-22 MED ORDER — ARMOUR THYROID 90 MG PO TABS: ORAL_TABLET | ORAL | 0 refills | Status: DC

## 2019-11-22 NOTE — Addendum Note (Signed)
Addended by: Carter Kitten on: 11/22/2019 10:03 AM   Modules accepted: Orders

## 2019-11-22 NOTE — Addendum Note (Signed)
Addended by: Carter Kitten on: 11/22/2019 09:05 AM   Modules accepted: Orders

## 2019-11-22 NOTE — Telephone Encounter (Addendum)
Tiffany Orozco notified as instructed by telephone.  Patient states understanding.  She will call back to schedule lab appointment in 4 weeks.  Medication list updated.  Rx sent to pharmacy with new dosing instructions/quantity.  Future orders entered in Epic for TSH.

## 2019-11-22 NOTE — Addendum Note (Signed)
Addended by: Carter Kitten on: 11/22/2019 10:02 AM   Modules accepted: Orders

## 2019-11-25 ENCOUNTER — Other Ambulatory Visit: Payer: Self-pay | Admitting: Family Medicine

## 2019-11-26 NOTE — Telephone Encounter (Signed)
Please schedule MWV with nurse and CPE with Dr. Diona Browner for end of July/first of August.

## 2019-11-29 ENCOUNTER — Ambulatory Visit: Payer: Medicare Other | Admitting: Physician Assistant

## 2019-12-25 ENCOUNTER — Ambulatory Visit (INDEPENDENT_AMBULATORY_CARE_PROVIDER_SITE_OTHER): Payer: Medicare Other | Admitting: Gastroenterology

## 2019-12-25 ENCOUNTER — Other Ambulatory Visit (INDEPENDENT_AMBULATORY_CARE_PROVIDER_SITE_OTHER): Payer: Medicare Other

## 2019-12-25 ENCOUNTER — Other Ambulatory Visit: Payer: Self-pay

## 2019-12-25 ENCOUNTER — Encounter: Payer: Self-pay | Admitting: Gastroenterology

## 2019-12-25 VITALS — BP 122/70 | HR 74 | Ht 59.0 in | Wt 182.0 lb

## 2019-12-25 DIAGNOSIS — K2100 Gastro-esophageal reflux disease with esophagitis, without bleeding: Secondary | ICD-10-CM | POA: Diagnosis not present

## 2019-12-25 DIAGNOSIS — E039 Hypothyroidism, unspecified: Secondary | ICD-10-CM | POA: Diagnosis not present

## 2019-12-25 DIAGNOSIS — R142 Eructation: Secondary | ICD-10-CM

## 2019-12-25 DIAGNOSIS — K582 Mixed irritable bowel syndrome: Secondary | ICD-10-CM | POA: Diagnosis not present

## 2019-12-25 LAB — TSH: TSH: 2.2 u[IU]/mL (ref 0.35–4.50)

## 2019-12-25 NOTE — Progress Notes (Signed)
[facility] Consult Note:  History: Tiffany Orozco 12/25/2019  Referring provider: Jinny Sanders, MD  Reason for consult/chief complaint: Nausea (constantly nauseous and vomitting when she has food stomach )   Subjective  HPI: From September 2017 office note by Dr. [name]: "GERD / belching / vomiting - extensive workup. She clearly has pathologic reflux on pH study which had a strong symptom index. She has been a variety of PPIs but state they all "make belching worse" and doesn't tolerate it. H2 blockers do not work, a trial of baclofen was not successful. I spent a lot of time with the patient and her husband today discussing all of her symptoms. She is currently not on any antacid and has worsening symptoms. I offered her a trial of Dexilant as she has not yet been on this, and also will add liquid Carafate to use PRN as well as Zofran for her nausea. We discussed trying empiric Reglan but she wishes to hold off on that for now. Given her dysphagia with benefit from prior dilation, I offered her a repeat EGD with dilation. Will also reassess for active esophagitis. Moving forward, given her refractory symptoms we can consider surgical treatment, however with her dysphagia and potential motility issue, this could potentially get worse with a surgery. We otherwise discussed there is a difference between belching and heartburn, and she may be interpreting belching as "worsening of reflux". I suspect she may likely have a component of supragastric belching as she belched throughout the visit today, but apparently per husband does not do this when sleeping. We may refer to behavioral health for biofeedback of supragastric belching / air swallowing moving forward. She agreed otherwise with EGD. She will hold aggrenox 3 days prior to the procedure if okay with prescribing provider and can use aspirin during this time. We otherwise discussed long term risks of PPIs given what  has been in the news recently regarding risks of MI / CVA. More studies need to be done regarding this association to determine a true association and cause / effect. Given her significant symptoms, she agreed to short term trial of Dexilant to see if this provides any significant relief. "  Had upper endoscopy October 2017 showing LA grade C reflux esophagitis with a subtle stricture, wire-guided dilation performed.  Then trial of Dexilant recommended.  Former patient of Dr. [name] before seeing Dr. [name] ___________________________  Patient called the office recently with ongoing symptoms and wished to change providers because her husband sees me.  ___________________________________  I saw this patient for another opinion within our practice for her chronic digestive symptoms.  She is a tangential historian, but is near as I can determine has similar symptoms to what was happening when she saw Dr. [name] and Dr. [name].  She is mostly bothered by frequent and sometimes constant belching.  If she just smells certain foods she gag and begin to belch and be nauseated.  Sometimes belching episodes then lead to what she describes as vomiting, but sound like regurgitation.  She does not seem to have dysphagia or odynophagia.  She question whether she should still be on Dexilant and what else might be done for her chronic belching.  She did not seem to recall the clinical impressions of Dr. [name] as outlined in his comprehensive notes. Tiffany Orozco recalls getting some antibiotics on a couple occasions, and thought perhaps this helped her upper digestive symptoms and wondered if she should do so again.  Her bowel  habits still tend to alternate between constipation and diarrhea and she has chronic bloating.  Lastly, she had recently noticed an intermittent protrusion in the mid right side of her abdomen and wondered if there was a hernia.    ROS:  Review of Systems  Constitutional: Positive  for fatigue. Negative for appetite change and unexpected weight change.  HENT: Negative for mouth sores and voice change.   Eyes: Negative for pain and redness.  Respiratory: Positive for cough and shortness of breath.   Cardiovascular: Negative for chest pain and palpitations.  Genitourinary: Negative for dysuria and hematuria.  Musculoskeletal: Negative for arthralgias and myalgias.  Skin: Negative for pallor and rash.  Neurological: Negative for weakness and headaches.  Hematological: Negative for adenopathy.  Psychiatric/Behavioral:       Anxiety     Past Medical History: Past Medical History:  Diagnosis Date  . Allergy   . Anxiety   . COPD (chronic obstructive pulmonary disease) ([place])   . DDD (degenerative disc disease), lumbar    mild  . Depression   . Female rectocele without uterine prolapse   . GERD (gastroesophageal reflux disease)   . Hearing loss   . Heart murmur   . Hyperlipidemia   . Hypertension   . Hypothyroidism   . IBS (irritable bowel syndrome)   . Influenza A 06/2015  . Obesity   . Osteopenia   . Plantar fasciitis   . Skin cancer of arm   . Skin cancer of face   . Stroke ([facility]) 07/2010   no permenant deficits  . Thrombocytopenia ([place])      Past Surgical History: Past Surgical History:  Procedure Laterality Date  . APPENDECTOMY    . CHOLECYSTECTOMY  2004  . CYST EXCISION    . EMGs  7/04   atms and wrists neg  . ESOPHAGEAL MANOMETRY N/A 03/17/2015   Procedure: ESOPHAGEAL MANOMETRY (EM);  Surgeon: Manus Gunning, MD;  Location: [facility];  Service: Gastroenterology;  Laterality: N/A;  . FOOT SURGERY  03/31/2009   R foot  . FOOT SURGERY  2008, 2009, 2010   right foot   . TUBAL LIGATION  1975     Family History: Family History  Problem Relation Age of Onset  . Skin cancer Father   . Hypertension Mother   . Lung cancer Brother   . Colon cancer Sister   . Esophageal cancer Sister   . Ovarian cancer Sister   . Stomach cancer  Neg Hx   . Pancreatic cancer Neg Hx     Social History: Social History   Socioeconomic History  . Marital status: Married    Spouse name: [name]  . Number of children: 2  . Years of education: Not on file  . Highest education level: Not on file  Occupational History  . Occupation: bookkeeper    Fish farm manager: UNEMPLOYED    Comment: 10/10 not working x 9 years  Tobacco Use  . Smoking status: Current Every Day Smoker    Packs/day: 1.00    Years: 31.00    Pack years: 31.00    Types: Cigarettes  . Smokeless tobacco: Never Used  . Tobacco comment: States she is not smoking during current illness 05/29/18 sb  Vaping Use  . Vaping Use: Never used  Substance and Sexual Activity  . Alcohol use: No    Alcohol/week: 0.0 standard drinks  . Drug use: No  . Sexual activity: Yes    Birth control/protection: None  Other Topics Concern  .  Not on file  Social History Narrative   5/10 husband had open heart surgery, not working, 2 grandchildren, siblings living and healthy   Social Determinants of Health   Financial Resource Strain:   . Difficulty of Paying Living Expenses:   Food Insecurity:   . Worried About Charity fundraiser in the Last Year:   . Arboriculturist in the Last Year:   Transportation Needs:   . Film/video editor (Medical):   Marland Kitchen Lack of Transportation (Non-Medical):   Physical Activity:   . Days of Exercise per Week:   . Minutes of Exercise per Session:   Stress:   . Feeling of Stress :   Social Connections:   . Frequency of Communication with Friends and Family:   . Frequency of Social Gatherings with Friends and Family:   . Attends Religious Services:   . Active Member of Clubs or Organizations:   . Attends Archivist Meetings:   Marland Kitchen Marital Status:     Allergies: Allergies  Allergen Reactions  . Lisinopril     Shortness of breath.  . Niacin     REACTION: dizziness  . Welchol [Colesevelam Hcl] Other (See Comments)     Diarrhea,Nausea, swelling in face  . Augmentin [Amoxicillin-Pot Clavulanate]     Nauseated. GI upset with taking.  . Codeine     keeps patient awake  . Oxycodone Hcl     Patient states this medication makes her hyper    Outpatient Meds: Current Outpatient Medications  Medication Sig Dispense Refill  . albuterol (PROVENTIL HFA;VENTOLIN HFA) 108 (90 Base) MCG/ACT inhaler Inhale 2 puffs into the lungs every 6 (six) hours as needed for wheezing or shortness of breath. 18 g 11  . amLODipine (NORVASC) 5 MG tablet TAKE 1 TABLET BY MOUTH EVERY DAY 90 tablet 0  . ARMOUR THYROID 90 MG tablet Take one tablet by mouth daily. 90 tablet 0  . azelastine (ASTELIN) 0.1 % nasal spray PLACE 1 SPRAY INTO BOTH NOSTRILS 2 (TWO) TIMES DAILY. 30 mL 5  . Cholecalciferol (VITAMIN D3) 2000 units TABS Take 1 tablet by mouth daily.    . Coenzyme Q10 (COQ10 PO) Take 1 capsule by mouth daily.    Marland Kitchen DEXILANT 60 MG capsule TAKE 1 CAPSULE BY MOUTH EVERY DAY 90 capsule 3  . dipyridamole-aspirin (AGGRENOX) 200-25 MG 12hr capsule TAKE 1 CAPSULE BY MOUTH TWICE A DAY 180 capsule 0  . ezetimibe (ZETIA) 10 MG tablet TAKE 1 TABLET (10 MG TOTAL) BY MOUTH DAILY. 90 tablet 3  . fluticasone (FLONASE) 50 MCG/ACT nasal spray SPRAY 2 SPRAYS INTO EACH NOSTRIL EVERY DAY 48 g 1  . hydrochlorothiazide (HYDRODIURIL) 25 MG tablet 1 tab po daily prn swelling 30 tablet 11  . Iodine, Kelp, (KELP PO) Take 600 mg by mouth daily.    . ondansetron (ZOFRAN-ODT) 8 MG disintegrating tablet Take 1 tablet (8 mg total) by mouth every 8 (eight) hours as needed for nausea or vomiting. 20 tablet 0  . polyethylene glycol powder (GLYCOLAX/MIRALAX) powder MIX 1 CAPFUL (17 GRAMS) IN 8 OUNCES OF LIQUID AND DRINK DAILY AS NEEDED FOR CONSTIPATION. 527 g 0  . traMADol (ULTRAM) 50 MG tablet Take 1 tablet (50 mg total) by mouth every 6 (six) hours as needed. 20 tablet 0   No current facility-administered medications for this visit.       ___________________________________________________________________ Objective   Exam:  BP 122/70   Pulse 74   Ht 4\' 11"  (1.499 m)  Wt 182 lb (82.6 kg)   BMI 36.76 kg/m    General: Normal vocal quality, belches frequently during exam.  Eyes: sclera anicteric, no redness  ENT: oral mucosa moist without lesions, no cervical or supraclavicular lymphadenopathy  CV: RRR without murmur, S1/S2, no JVD, no peripheral edema  Resp: clear to auscultation bilaterally, normal RR and effort noted  GI: soft, no tenderness, with active bowel sounds. No guarding or palpable organomegaly noted.  Small and reducible abdominal wall hernia to the right of the umbilicus.  Skin; warm and dry, no rash or jaundice noted  Neuro: awake, alert and oriented x 3. Normal gross motor function and fluent speech  Labs: Last EKG Jan 2017   Radiologic Studies:  Multiple previous endoscopic, manometric and radiologic studies reviewed.  Assessment: Encounter Diagnoses  Name Primary?  . Gastroesophageal reflux disease with esophagitis without hemorrhage Yes  . Belching   . Irritable bowel syndrome with both constipation and diarrhea     This patient has a longstanding constellation of symptoms that she reports are really better now than they were a few years ago.  I agree with Dr. Doyne Keel assessment this patient has some kind of an upper digestive dysmotility as well as a behavioral issue of supra gastric belching and aerophagia. I did my best to explain how acid suppression medication cannot improve those, but there is still benefit from the medicine to help protect esophagus against the effects of reflux.  She wondered if there were other medicines and then that that might be better.  I pointed out that she had been on numerous PPIs and H2 blockers over the years that were apparently not tolerated in one way or another.  I do not think she has gastroparesis.  She clearly has a  hypersensitive gag reflex with nausea, then the repetitive belching seems to trigger regurgitation leading to the esophagitis seen on previous endoscopy.  Given that, I agree entirely that I would not pursue surgical management of her reflux.  Plan:  Recommended she continue Dexilant, and did my best to explain the nature of supra gastric belching and aerophagia, as Dr. [name] had done on multiple prior occasions.   No indication for antibiotic therapy.  See our practice as needed.  She appears to be due for a screening colonoscopy later this year, and recall will be sent at that time.  Thank you for the courtesy of this consult.  Please call me with any questions or concerns.  Nelida Meuse III  CC: Referring provider noted above

## 2019-12-25 NOTE — Patient Instructions (Signed)
If you are age 71 or older, your body mass index should be between 23-30. Your Body mass index is 36.76 kg/m. If this is out of the aforementioned range listed, please consider follow up with your Primary Care Provider.  If you are age 34 or younger, your body mass index should be between 19-25. Your Body mass index is 36.76 kg/m. If this is out of the aformentioned range listed, please consider follow up with your Primary Care Provider.   Follow up as needed. [phone]  It was a pleasure to see you today!  Dr. Loletha Carrow

## 2019-12-26 ENCOUNTER — Encounter: Payer: Self-pay | Admitting: *Deleted

## 2020-01-23 DIAGNOSIS — Z1231 Encounter for screening mammogram for malignant neoplasm of breast: Secondary | ICD-10-CM | POA: Diagnosis not present

## 2020-01-23 LAB — HM MAMMOGRAPHY

## 2020-01-29 ENCOUNTER — Ambulatory Visit
Admission: RE | Admit: 2020-01-29 | Discharge: 2020-01-29 | Disposition: A | Discharge status: Home / Self Care | Payer: Medicare Other | Source: Ambulatory Visit | Attending: Acute Care | Admitting: Acute Care

## 2020-01-29 DIAGNOSIS — F17210 Nicotine dependence, cigarettes, uncomplicated: Secondary | ICD-10-CM | POA: Diagnosis not present

## 2020-01-29 DIAGNOSIS — Z87891 Personal history of nicotine dependence: Secondary | ICD-10-CM | POA: Diagnosis not present

## 2020-01-29 DIAGNOSIS — Z136 Encounter for screening for cardiovascular disorders: Secondary | ICD-10-CM | POA: Diagnosis not present

## 2020-01-29 DIAGNOSIS — Z122 Encounter for screening for malignant neoplasm of respiratory organs: Secondary | ICD-10-CM

## 2020-02-07 NOTE — Progress Notes (Signed)

## 2020-02-08 ENCOUNTER — Other Ambulatory Visit: Payer: Self-pay | Admitting: *Deleted

## 2020-02-08 DIAGNOSIS — Z87891 Personal history of nicotine dependence: Secondary | ICD-10-CM

## 2020-02-08 DIAGNOSIS — F17210 Nicotine dependence, cigarettes, uncomplicated: Secondary | ICD-10-CM

## 2020-02-12 ENCOUNTER — Encounter: Payer: Self-pay | Admitting: Family Medicine

## 2020-02-12 DIAGNOSIS — R928 Other abnormal and inconclusive findings on diagnostic imaging of breast: Secondary | ICD-10-CM | POA: Diagnosis not present

## 2020-02-15 ENCOUNTER — Encounter: Payer: Self-pay | Admitting: Family Medicine

## 2020-02-28 ENCOUNTER — Ambulatory Visit: Payer: Medicare Other

## 2020-02-29 ENCOUNTER — Other Ambulatory Visit: Payer: Self-pay

## 2020-02-29 ENCOUNTER — Encounter: Payer: Self-pay | Admitting: Family Medicine

## 2020-02-29 ENCOUNTER — Ambulatory Visit (INDEPENDENT_AMBULATORY_CARE_PROVIDER_SITE_OTHER): Payer: Medicare Other | Admitting: Family Medicine

## 2020-02-29 VITALS — BP 120/70 | HR 85 | Temp 97.9°F | Ht 59.0 in | Wt 177.2 lb

## 2020-02-29 DIAGNOSIS — R1905 Periumbilic swelling, mass or lump: Secondary | ICD-10-CM

## 2020-02-29 DIAGNOSIS — F321 Major depressive disorder, single episode, moderate: Secondary | ICD-10-CM

## 2020-02-29 DIAGNOSIS — E559 Vitamin D deficiency, unspecified: Secondary | ICD-10-CM

## 2020-02-29 DIAGNOSIS — E7800 Pure hypercholesterolemia, unspecified: Secondary | ICD-10-CM

## 2020-02-29 DIAGNOSIS — Z0000 Encounter for general adult medical examination without abnormal findings: Secondary | ICD-10-CM

## 2020-02-29 DIAGNOSIS — I10 Essential (primary) hypertension: Secondary | ICD-10-CM | POA: Diagnosis not present

## 2020-02-29 DIAGNOSIS — E038 Other specified hypothyroidism: Secondary | ICD-10-CM

## 2020-02-29 DIAGNOSIS — E538 Deficiency of other specified B group vitamins: Secondary | ICD-10-CM

## 2020-02-29 LAB — LIPID PANEL
Cholesterol: 144 mg/dL (ref 0–200)
HDL: 39.3 mg/dL (ref >39.00)
LDL Cholesterol: 78 mg/dL (ref 0–99)
NonHDL: 104.92
Total CHOL/HDL Ratio: 4
Triglycerides: 135 mg/dL (ref 0.0–149.0)
VLDL: 27 mg/dL (ref 0.0–40.0)

## 2020-02-29 LAB — COMPREHENSIVE METABOLIC PANEL
ALT: 17 U/L (ref 0–35)
AST: 16 U/L (ref 0–37)
Albumin: 4.3 g/dL (ref 3.5–5.2)
Alkaline Phosphatase: 102 U/L (ref 39–117)
BUN: 11 mg/dL (ref 6–23)
CO2: 27 mEq/L (ref 19–32)
Calcium: 9.2 mg/dL (ref 8.4–10.5)
Chloride: 89 mEq/L — ABNORMAL LOW (ref 96–112)
Creatinine, Ser: 0.74 mg/dL (ref 0.40–1.20)
GFR: 77.28 mL/min (ref >60.00)
Glucose, Bld: 87 mg/dL (ref 70–99)
Potassium: 4 mEq/L (ref 3.5–5.1)
Sodium: 126 mEq/L — ABNORMAL LOW (ref 135–145)
Total Bilirubin: 0.5 mg/dL (ref 0.2–1.2)
Total Protein: 7.5 g/dL (ref 6.0–8.3)

## 2020-02-29 LAB — VITAMIN B12: Vitamin B-12: 573 pg/mL (ref 211–911)

## 2020-02-29 LAB — VITAMIN D 25 HYDROXY (VIT D DEFICIENCY, FRACTURES): VITD: 79.6 ng/mL (ref 30.00–100.00)

## 2020-02-29 MED ORDER — ARMOUR THYROID 90 MG PO TABS
ORAL_TABLET | ORAL | 3 refills | Status: DC
Start: 2020-02-29 — End: 2021-03-31

## 2020-02-29 MED ORDER — ALBUTEROL SULFATE HFA 108 (90 BASE) MCG/ACT IN AERS: 2.0000 | INHALATION_SPRAY | Freq: Four times a day (QID) | RESPIRATORY_TRACT | 11 refills | Status: DC | PRN

## 2020-02-29 MED ORDER — FLUTICASONE PROPIONATE 50 MCG/ACT NA SUSP: NASAL | 3 refills | Status: DC

## 2020-02-29 MED ORDER — HYDROCHLOROTHIAZIDE 25 MG PO TABS
ORAL_TABLET | ORAL | 3 refills | Status: DC
Start: 2020-02-29 — End: 2020-04-03

## 2020-02-29 MED ORDER — EZETIMIBE 10 MG PO TABS: 10.0000 mg | ORAL_TABLET | Freq: Every day | ORAL | 3 refills | Status: DC

## 2020-02-29 MED ORDER — POLYETHYLENE GLYCOL 3350 17 GM/SCOOP PO POWD
ORAL | 3 refills | Status: DC
Start: 2020-02-29 — End: 2020-05-08

## 2020-02-29 MED ORDER — DEXILANT 60 MG PO CPDR: 60.0000 mg | DELAYED_RELEASE_CAPSULE | Freq: Every day | ORAL | 3 refills | Status: DC

## 2020-02-29 MED ORDER — ASPIRIN-DIPYRIDAMOLE ER 25-200 MG PO CP12: 1.0000 | ORAL_CAPSULE | Freq: Two times a day (BID) | ORAL | 3 refills | Status: DC

## 2020-02-29 MED ORDER — AMLODIPINE BESYLATE 5 MG PO TABS: 5.0000 mg | ORAL_TABLET | Freq: Every day | ORAL | 3 refills | Status: DC

## 2020-02-29 NOTE — Progress Notes (Signed)
Chief Complaint  Patient presents with  . Medicare Wellness    History of Present Illness: HPI  The patient presents for annual medicare wellness, complete physical and review of chronic health problems. He/She also has the following acute concerns today:  Noted 1 year ago.Marland Kitchen after constipation.. noted firmness and bruising in fatty tissue of abdomen. Bruising resolved but still has firmer area. Uncomfortable at tissue if pants putting pressure.  I have personally reviewed the Medicare Annual Wellness questionnaire and have noted 1. The patient's medical and social history 2. Their use of alcohol, tobacco or illicit drugs 3. Their current medications and supplements 4. The patient's functional ability including ADL's, fall risks, home safety risks and hearing or visual             impairment. 5. Diet and physical activities 6. Evidence for depression or mood disorders 7.         Updated provider list Cognitive evaluation was performed and recorded on pt medicare questionnaire form. The patients weight, height, BMI and visual acuity have been recorded in the chart  I have made referrals, counseling and provided education to the patient based review of the above and I have provided the pt with a written personalized care plan for preventive services.   Documentation of this information was scanned into the electronic record under the media tab.  Advance directives and end of life planning reviewed in detail with patient and documented in EMR. Patient given handout on advance care directives if needed. HCPOA and living will updated if needed.   Hearing Screening   Method: Audiometry   125Hz  250Hz  500Hz  1000Hz  2000Hz  3000Hz  4000Hz  6000Hz  8000Hz   Right ear:   0 0 20  20    Left ear:   0 0 20  20      Visual Acuity Screening   Right eye Left eye Both eyes  Without correction:     With correction: 20/25 20/25 20/15     Fall Risk  02/29/2020 04/25/2019 12/26/2018 12/14/2017 12/20/2016   Falls in the past year? 1 0 0 No No  Comment - Emmi Telephone Survey: data to providers prior to load - - Emmi Telephone Survey: data to providers prior to load  Number falls in past yr: 0 - - - -  Injury with Fall? 0 - - - -      Office Visit from 12/26/2018 in [place] at [facility]  PHQ-2 Total Score 0     MDD : well controlled on wellbutrin XL  Elevated Cholesterol:  Due for re-eval Lab Results  Component Value Date   CHOL 146 12/20/2018   HDL 37.80 (L) 12/20/2018   LDLCALC 76 12/20/2018   LDLDIRECT 103.0 09/02/2016   TRIG 159.0 (H) 12/20/2018   CHOLHDL 4 12/20/2018  Using medications without problems: Muscle aches:  Diet compliance: Exercise: Other complaints:  Hypertension:    At goal on current regimen. BP Readings from Last 3 Encounters:  02/29/20 120/70  12/25/19 122/70  09/07/19 (!) 156/72  Using medication without problems or lightheadedness:  Chest pain with exertion: Edema: Short of breath: Average home BPs: Other issues:  Hypothyrid.. stable last check. Lab Results  Component Value Date   TSH 2.20 12/25/2019      This visit occurred during the SARS-CoV-2 public health emergency.  Safety protocols were in place, including screening questions prior to the visit, additional usage of staff PPE, and extensive cleaning of exam room while observing appropriate contact time as indicated for disinfecting  solutions.   COVID 19 screen:  No recent travel or known exposure to COVID19 The patient denies respiratory symptoms of COVID 19 at this time. The importance of social distancing was discussed today.     Review of Systems  Constitutional: Negative for chills and fever.  HENT: Negative for congestion and ear pain.   Eyes: Negative for pain and redness.  Respiratory: Negative for cough and shortness of breath.   Cardiovascular: Negative for chest pain, palpitations and leg swelling.  Gastrointestinal: Negative for abdominal pain, blood in  stool, constipation, diarrhea, nausea and vomiting.  Genitourinary: Negative for dysuria.  Musculoskeletal: Negative for falls and myalgias.  Skin: Negative for rash.  Neurological: Negative for dizziness.  Psychiatric/Behavioral: Negative for depression. The patient is not nervous/anxious.       Past Medical History:  Diagnosis Date  . Allergy   . Anxiety   . COPD (chronic obstructive pulmonary disease) ([place])   . DDD (degenerative disc disease), lumbar    mild  . Depression   . Female rectocele without uterine prolapse   . GERD (gastroesophageal reflux disease)   . Hearing loss   . Heart murmur   . Hyperlipidemia   . Hypertension   . Hypothyroidism   . IBS (irritable bowel syndrome)   . Influenza A 06/2015  . Obesity   . Osteopenia   . Plantar fasciitis   . Skin cancer of arm   . Skin cancer of face   . Stroke ([place]) 07/2010   no permenant deficits  . Thrombocytopenia ([place])     reports that she has been smoking cigarettes. She has a 31.00 pack-year smoking history. She has never used smokeless tobacco. She reports that she does not drink alcohol and does not use drugs.   Current Outpatient Medications:  .  albuterol (PROVENTIL HFA;VENTOLIN HFA) 108 (90 Base) MCG/ACT inhaler, Inhale 2 puffs into the lungs every 6 (six) hours as needed for wheezing or shortness of breath., Disp: 18 g, Rfl: 11 .  amLODipine (NORVASC) 5 MG tablet, TAKE 1 TABLET BY MOUTH EVERY DAY, Disp: 90 tablet, Rfl: 0 .  ARMOUR THYROID 90 MG tablet, Take one tablet by mouth daily., Disp: 90 tablet, Rfl: 0 .  azelastine (ASTELIN) 0.1 % nasal spray, PLACE 1 SPRAY INTO BOTH NOSTRILS 2 (TWO) TIMES DAILY., Disp: 30 mL, Rfl: 5 .  Cholecalciferol (VITAMIN D3) 2000 units TABS, Take 1 tablet by mouth daily., Disp: , Rfl:  .  Coenzyme Q10 (COQ10 PO), Take 1 capsule by mouth daily., Disp: , Rfl:  .  DEXILANT 60 MG capsule, TAKE 1 CAPSULE BY MOUTH EVERY DAY, Disp: 90 capsule, Rfl: 3 .  dipyridamole-aspirin (AGGRENOX)  200-25 MG 12hr capsule, TAKE 1 CAPSULE BY MOUTH TWICE A DAY, Disp: 180 capsule, Rfl: 0 .  ezetimibe (ZETIA) 10 MG tablet, TAKE 1 TABLET (10 MG TOTAL) BY MOUTH DAILY., Disp: 90 tablet, Rfl: 3 .  fluticasone (FLONASE) 50 MCG/ACT nasal spray, SPRAY 2 SPRAYS INTO EACH NOSTRIL EVERY DAY, Disp: 48 g, Rfl: 1 .  hydrochlorothiazide (HYDRODIURIL) 25 MG tablet, 1 tab po daily prn swelling, Disp: 30 tablet, Rfl: 11 .  Iodine, Kelp, (KELP PO), Take 600 mg by mouth daily., Disp: , Rfl:  .  ondansetron (ZOFRAN-ODT) 8 MG disintegrating tablet, Take 1 tablet (8 mg total) by mouth every 8 (eight) hours as needed for nausea or vomiting., Disp: 20 tablet, Rfl: 0 .  polyethylene glycol powder (GLYCOLAX/MIRALAX) powder, MIX 1 CAPFUL (17 GRAMS) IN 8 OUNCES OF LIQUID  AND DRINK DAILY AS NEEDED FOR CONSTIPATION., Disp: 527 g, Rfl: 0 .  traMADol (ULTRAM) 50 MG tablet, Take 1 tablet (50 mg total) by mouth every 6 (six) hours as needed., Disp: 20 tablet, Rfl: 0   Observations/Objective: Blood pressure 120/70, pulse 85, temperature 97.9 F (36.6 C), temperature source Temporal, height 4\' 11"  (1.499 m), weight 177 lb 4 oz (80.4 kg), SpO2 97 %.  Physical Exam Constitutional:      General: She is not in acute distress.    Appearance: Normal appearance. She is well-developed. She is obese. She is not ill-appearing or toxic-appearing.  HENT:     Head: Normocephalic.     Right Ear: Hearing, tympanic membrane, ear canal and external ear normal.     Left Ear: Hearing, tympanic membrane, ear canal and external ear normal.     Nose: Nose normal.  Eyes:     General: Lids are normal. Lids are everted, no foreign bodies appreciated.     Conjunctiva/sclera: Conjunctivae normal.     Pupils: Pupils are equal, round, and reactive to light.  Neck:     Thyroid: No thyroid mass or thyromegaly.     Vascular: Carotid bruit present.     Trachea: Trachea normal.  Cardiovascular:     Rate and Rhythm: Normal rate and regular rhythm.      Heart sounds: Normal heart sounds, S1 normal and S2 normal. No murmur heard.  No gallop.   Pulmonary:     Effort: Pulmonary effort is normal. No respiratory distress.     Breath sounds: Normal breath sounds. No wheezing, rhonchi or rales.  Abdominal:     General: Bowel sounds are normal. There is no distension or abdominal bruit.     Palpations: Abdomen is soft. There is no fluid wave or mass.     Tenderness: There is no abdominal tenderness. There is no guarding or rebound.     Hernia: No hernia is present.  Musculoskeletal:     Cervical back: Normal range of motion and neck supple.  Lymphadenopathy:     Cervical: No cervical adenopathy.  Skin:    General: Skin is warm and dry.     Findings: No rash.  Neurological:     Mental Status: She is alert.     Cranial Nerves: No cranial nerve deficit.     Sensory: No sensory deficit.  Psychiatric:        Mood and Affect: Mood is not anxious or depressed.        Speech: Speech normal.        Behavior: Behavior normal. Behavior is cooperative.        Judgment: Judgment normal.      Assessment and Plan The patient's preventative maintenance and recommended screening tests for an annual wellness exam were reviewed in full today. Brought up to date unless services declined.  Counselled on the importance of diet, exercise, and its role in overall health and mortality. The patient's FH and SH was reviewed, including their home life, tobacco status, and drug and alcohol status.   Vaccines: Uptodate  Including COVID series, refused flu PAP/DVE:not indicated No current symptoms of prolapsed uterus. Mammo:12/2019 DXA: osteopenia7/2020 Colon: 04/2010, Dr. [name], nml, repeat in 10 years . Smoker:precontemplative>30pack year history[place] chest [place]   Hypothyroidism Well controlled. Continue current medication.   HYPERCHOLESTEROLEMIA Due for re-eval.  HTN (hypertension) Well controlled.  Continue current medication.   Depression, major, single episode, moderate ([place]) Well controlled. Continue current  medication.     [name], MD

## 2020-02-29 NOTE — Assessment & Plan Note (Addendum)
Well controlled. Continue current medication.  

## 2020-02-29 NOTE — Assessment & Plan Note (Signed)
Well controlled. Continue current medication.  

## 2020-02-29 NOTE — Assessment & Plan Note (Signed)
Due for re-eval. 

## 2020-02-29 NOTE — Patient Instructions (Addendum)
Please stop at the lab to have labs drawn.  Get flu shot!  We will call to set up abdominal US to look at the mass near your belly button.

## 2020-03-04 ENCOUNTER — Ambulatory Visit: Payer: Medicare Other

## 2020-03-07 ENCOUNTER — Telehealth: Payer: Self-pay

## 2020-03-07 NOTE — Telephone Encounter (Signed)
Tiffany Orozco notified as instructed by telephone.  Patient states understanding.

## 2020-03-07 NOTE — Telephone Encounter (Signed)
Okay to take zofran.  If BP> 140/90 restart amlodipine. If reflux restart dexilant... should not cause nausea but instead help.

## 2020-03-07 NOTE — Telephone Encounter (Signed)
Pt called triage line to say she stopped the dexilant, hydrochlorothiazide, and amlodipine. She is having a lot of nausea. She is asking if it is okay for her to take ondansteron while she is waiting to have the US done on Monday. She had to take one last night. Please advise at [phone].

## 2020-03-10 ENCOUNTER — Other Ambulatory Visit: Payer: Self-pay

## 2020-03-10 ENCOUNTER — Ambulatory Visit
Admission: RE | Admit: 2020-03-10 | Discharge: 2020-03-10 | Disposition: A | Discharge status: Home / Self Care | Payer: Medicare Other | Source: Ambulatory Visit | Attending: Family Medicine | Admitting: Family Medicine

## 2020-03-10 ENCOUNTER — Telehealth: Payer: Self-pay | Admitting: Family Medicine

## 2020-03-10 ENCOUNTER — Other Ambulatory Visit (INDEPENDENT_AMBULATORY_CARE_PROVIDER_SITE_OTHER): Payer: Medicare Other

## 2020-03-10 ENCOUNTER — Other Ambulatory Visit: Payer: Self-pay | Admitting: Family Medicine

## 2020-03-10 DIAGNOSIS — R1905 Periumbilic swelling, mass or lump: Secondary | ICD-10-CM

## 2020-03-10 DIAGNOSIS — E871 Hypo-osmolality and hyponatremia: Secondary | ICD-10-CM

## 2020-03-10 DIAGNOSIS — K429 Umbilical hernia without obstruction or gangrene: Secondary | ICD-10-CM | POA: Diagnosis not present

## 2020-03-10 LAB — BASIC METABOLIC PANEL
BUN: 13 mg/dL (ref 6–23)
CO2: 28 mEq/L (ref 19–32)
Calcium: 9 mg/dL (ref 8.4–10.5)
Chloride: 97 mEq/L (ref 96–112)
Creatinine, Ser: 0.75 mg/dL (ref 0.40–1.20)
GFR: 79.96 mL/min (ref >60.00)
Glucose, Bld: 118 mg/dL — ABNORMAL HIGH (ref 70–99)
Potassium: 4 mEq/L (ref 3.5–5.1)
Sodium: 133 mEq/L — ABNORMAL LOW (ref 135–145)

## 2020-03-10 NOTE — Telephone Encounter (Signed)
-----   Message from Cloyd Stagers, RT sent at 03/03/2020  1:54 PM EDT ----- Regarding: Lab Orders for Monday 10.11.2021 Please place lab orders for Monday 10.11.2021, appt nots state "Na" Thank you, Dyke Maes RT(R)

## 2020-03-13 ENCOUNTER — Telehealth: Payer: Self-pay

## 2020-03-13 DIAGNOSIS — K429 Umbilical hernia without obstruction or gangrene: Secondary | ICD-10-CM

## 2020-03-13 MED ORDER — ONDANSETRON 8 MG PO TBDP: 8.0000 mg | ORAL_TABLET | Freq: Three times a day (TID) | ORAL | 0 refills | Status: DC | PRN

## 2020-03-13 NOTE — Telephone Encounter (Signed)
Lab results and abdominal ultrasound results discussed with patient.  She would like to be referred to a surgeon for consultation.  She prefers [place] location.  She would also like refill on her zofran.  Last refilled 09/09/2019 for #20 with no refills.

## 2020-03-13 NOTE — Telephone Encounter (Signed)
Pt has called asking for her resent labs a [place] results.

## 2020-03-17 ENCOUNTER — Telehealth: Payer: Self-pay | Admitting: *Deleted

## 2020-03-17 MED ORDER — ONDANSETRON HCL 8 MG PO TABS: 8.0000 mg | ORAL_TABLET | Freq: Three times a day (TID) | ORAL | 0 refills | Status: DC | PRN

## 2020-03-17 NOTE — Telephone Encounter (Signed)
Spoke to pt and notified her of new Rx sent in.

## 2020-03-17 NOTE — Telephone Encounter (Signed)
Patient states her medication is making her sick, the Zofran-ODT. The taste of it sets off nausea and dry heaves.  Patient states she thinks she needs the tablet that she swallows vs the disintegrating tablet.  Patient has had extreme diarrhea x 4 days and has lost 9 lbs.  Patient says she has no fever, or other symptoms.  Patient says she is awaiting a referral for this condition but is in need of this medication now.

## 2020-03-17 NOTE — Telephone Encounter (Signed)
I am not sure this is the issue, but I have sent ina rx for the non disintegrating tablet.

## 2020-03-18 ENCOUNTER — Ambulatory Visit (INDEPENDENT_AMBULATORY_CARE_PROVIDER_SITE_OTHER): Payer: Medicare Other | Admitting: General Surgery

## 2020-03-18 ENCOUNTER — Other Ambulatory Visit: Payer: Self-pay

## 2020-03-18 ENCOUNTER — Encounter: Payer: Self-pay | Admitting: General Surgery

## 2020-03-18 VITALS — BP 147/80 | HR 87 | Temp 98.0°F | Ht <= 58 in | Wt 175.0 lb

## 2020-03-18 DIAGNOSIS — K429 Umbilical hernia without obstruction or gangrene: Secondary | ICD-10-CM

## 2020-03-18 NOTE — Progress Notes (Signed)
Request for Medical Clearance and to stop Aggrenox 1 week prior to surgery faxed to Dr Balinda Quails office. [place]: [phone] Fax: [phone]

## 2020-03-18 NOTE — Patient Instructions (Addendum)
You have requested for your Umbilical Hernia be repaired. This will be scheduled at [facility] with Dr Celine Ahr.  Please see your (blue)pre-care sheet for information. Our surgery scheduler will be in contact with you to look at surgery dates and go over information.   You will need to be off of your Aggrenox for one week prior to surgery. We will get clearance from Dr Diona Browner for this.   You will need to arrange to be off work for 1-2 weeks but will have to have a lifting restriction of no more than 15 lbs for 6 weeks following your surgery.   Umbilical Hernia, Adult A hernia is a bulge of tissue that pushes through an opening between muscles. An umbilical hernia happens in the abdomen, near the belly button (umbilicus). The hernia may contain tissues from the small intestine, large intestine, or fatty tissue covering the intestines (omentum). Umbilical hernias in adults tend to get worse over time, and they require surgical treatment. There are several types of umbilical hernias. You may have:  A hernia located just above or below the umbilicus (indirect hernia). This is the most common type of umbilical hernia in adults.  A hernia that forms through an opening formed by the umbilicus (direct hernia).  A hernia that comes and goes (reducible hernia). A reducible hernia may be visible only when you strain, lift something heavy, or cough. This type of hernia can be pushed back into the abdomen (reduced).  A hernia that traps abdominal tissue inside the hernia (incarcerated hernia). This type of hernia cannot be reduced.  A hernia that cuts off blood flow to the tissues inside the hernia (strangulated hernia). The tissues can start to die if this happens. This type of hernia requires emergency treatment.  What are the causes? An umbilical hernia happens when tissue inside the abdomen presses on a weak area of the abdominal muscles. What increases the risk? You may have a greater risk of  this condition if you:  Are obese.  Have had several pregnancies.  Have a buildup of fluid inside your abdomen (ascites).  Have had surgery that weakens the abdominal muscles.  What are the signs or symptoms? The main symptom of this condition is a painless bulge at or near the belly button. A reducible hernia may be visible only when you strain, lift something heavy, or cough. Other symptoms may include:  Dull pain.  A feeling of pressure.  Symptoms of a strangulated hernia may include:  Pain that gets increasingly worse.  Nausea and vomiting.  Pain when pressing on the hernia.  Skin over the hernia becoming red or purple.  Constipation.  Blood in the stool.  How is this diagnosed? This condition may be diagnosed based on:  A physical exam. You may be asked to cough or strain while standing. These actions increase the pressure inside your abdomen and force the hernia through the opening in your muscles. Your health care provider may try to reduce the hernia by pressing on it.  Your symptoms and medical history.  How is this treated? Surgery is the only treatment for an umbilical hernia. Surgery for a strangulated hernia is done as soon as possible. If you have a small hernia that is not incarcerated, you may need to lose weight before having surgery. Follow these instructions at home:  Lose weight, if told by your health care provider.  Do not try to push the hernia back in.  Watch your hernia for any changes  in color or size. Tell your health care provider if any changes occur.  You may need to avoid activities that increase pressure on your hernia.  Do not lift anything that is heavier than 10 lb (4.5 kg) until your health care provider says that this is safe.  Take over-the-counter and prescription medicines only as told by your health care provider.  Keep all follow-up visits as told by your health care provider. This is important. Contact a health care  provider if:  Your hernia gets larger.  Your hernia becomes painful. Get help right away if:  You develop sudden, severe pain near the area of your hernia.  You have pain as well as nausea or vomiting.  You have pain and the skin over your hernia changes color.  You develop a fever. This information is not intended to replace advice given to you by your health care provider. Make sure you discuss any questions you have with your health care provider. Document Released: 10/17/2015 Document Revised: 01/18/2016 Document Reviewed: 10/17/2015 Elsevier Interactive Patient Education  [employer].

## 2020-03-18 NOTE — Progress Notes (Signed)
Patient ID: Tiffany Orozco, female   DOB: Feb 28, 1951, 69 y.o.   MRN: [id number]  Chief Complaint  Patient presents with  . New Patient (Initial Visit)    Ventral Hernia    HPI Tiffany Orozco is a 69 y.o. female.   She has been referred for further evaluation of a ventral hernia.  She reports that she has had it for at least 5 years.  About 2 years ago, it began to feel uncomfortable.  She endorses discomfort at the site without radiation.  It does bulge but she is able to reduce it.  It bothers her most when she is sitting or walking for a long time.  She does endorse some nausea, along with diarrhea.  She has previously had 3 abdominal operations including a cholecystectomy in 2003, tubal ligation, and an appendectomy.   Past Medical History:  Diagnosis Date  . Allergy   . Anxiety   . COPD (chronic obstructive pulmonary disease) ([place])   . DDD (degenerative disc disease), lumbar    mild  . Depression   . Female rectocele without uterine prolapse   . GERD (gastroesophageal reflux disease)   . Hearing loss   . Heart murmur   . Hyperlipidemia   . Hypertension   . Hypothyroidism   . IBS (irritable bowel syndrome)   . Influenza A 06/2015  . Obesity   . Osteopenia   . Plantar fasciitis   . Skin cancer of arm   . Skin cancer of face   . Stroke ([place]) 07/2010   no permenant deficits  . Thrombocytopenia ([place])     Past Surgical History:  Procedure Laterality Date  . APPENDECTOMY    . CHOLECYSTECTOMY  2004  . CYST EXCISION    . EMGs  7/04   atms and wrists neg  . ESOPHAGEAL MANOMETRY N/A 03/17/2015   Procedure: ESOPHAGEAL MANOMETRY (EM);  Surgeon: Manus Gunning, MD;  Location: [facility];  Service: Gastroenterology;  Laterality: N/A;  . FOOT SURGERY  03/31/2009   R foot  . FOOT SURGERY  2008, 2009, 2010   right foot   . TUBAL LIGATION  1975    Family History  Problem Relation Age of Onset  . Skin cancer Father   . Hypertension Mother   . Lung cancer Brother    . Colon cancer Sister   . Esophageal cancer Sister   . Ovarian cancer Sister   . Stomach cancer Neg Hx   . Pancreatic cancer Neg Hx     Social History Social History   Tobacco Use  . Smoking status: Current Every Day Smoker    Packs/day: 1.00    Years: 31.00    Pack years: 31.00    Types: Cigarettes  . Smokeless tobacco: Never Used  . Tobacco comment: States she is not smoking during current illness 05/29/18 sb  Vaping Use  . Vaping Use: Never used  Substance Use Topics  . Alcohol use: No    Alcohol/week: 0.0 standard drinks  . Drug use: No    Allergies  Allergen Reactions  . Lisinopril     Shortness of breath.  . Niacin     REACTION: dizziness  . Welchol [Colesevelam Hcl] Other (See Comments)    Diarrhea,Nausea, swelling in face  . Augmentin [Amoxicillin-Pot Clavulanate]     Nauseated. GI upset with taking.  . Codeine     keeps patient awake  . Oxycodone Hcl     Patient states this medication makes her hyper  Current Outpatient Medications  Medication Sig Dispense Refill  . albuterol (VENTOLIN HFA) 108 (90 Base) MCG/ACT inhaler Inhale 2 puffs into the lungs every 6 (six) hours as needed for wheezing or shortness of breath. 18 g 11  . ARMOUR THYROID 90 MG tablet Take one tablet by mouth daily. 90 tablet 3  . azelastine (ASTELIN) 0.1 % nasal spray PLACE 1 SPRAY INTO BOTH NOSTRILS 2 (TWO) TIMES DAILY. 30 mL 5  . Cholecalciferol (VITAMIN D3) 2000 units TABS Take 1 tablet by mouth daily.    . Coenzyme Q10 (COQ10 PO) Take 1 capsule by mouth daily.    Marland Kitchen dipyridamole-aspirin (AGGRENOX) 200-25 MG 12hr capsule Take 1 capsule by mouth 2 (two) times daily. 180 capsule 3  . ezetimibe (ZETIA) 10 MG tablet Take 1 tablet (10 mg total) by mouth daily. 90 tablet 3  . fluticasone (FLONASE) 50 MCG/ACT nasal spray SPRAY 2 SPRAYS INTO EACH NOSTRIL EVERY DAY 48 g 3  . Iodine, Kelp, (KELP PO) Take 600 mg by mouth daily.    . ondansetron (ZOFRAN) 8 MG tablet Take 1 tablet (8 mg  total) by mouth every 8 (eight) hours as needed for nausea or vomiting. 20 tablet 0  . amLODipine (NORVASC) 5 MG tablet Take 1 tablet (5 mg total) by mouth daily. (Patient not taking: Reported on 03/18/2020) 90 tablet 3  . dexlansoprazole (DEXILANT) 60 MG capsule Take 1 capsule (60 mg total) by mouth daily. (Patient not taking: Reported on 03/18/2020) 90 capsule 3  . hydrochlorothiazide (HYDRODIURIL) 25 MG tablet 1 tab po daily prn swelling (Patient not taking: Reported on 03/18/2020) 90 tablet 3  . polyethylene glycol powder (GLYCOLAX/MIRALAX) 17 GM/SCOOP powder MIX 1 CAPFUL (17 GRAMS) IN 8 OUNCES OF LIQUID AND DRINK DAILY AS NEEDED FOR CONSTIPATION. (Patient not taking: Reported on 03/18/2020) 527 g 3   No current facility-administered medications for this visit.    Review of Systems Review of Systems  HENT: Positive for hearing loss and tinnitus.   Respiratory: Positive for shortness of breath.   Cardiovascular: Positive for palpitations.       Orthopnea  Gastrointestinal: Positive for abdominal pain, diarrhea and nausea.       GERD  Skin:       Pruritus  Neurological: Positive for dizziness, speech difficulty and headaches.  Psychiatric/Behavioral: Positive for dysphoric mood.  All other systems reviewed and are negative.   Blood pressure (!) 147/80, pulse 87, temperature 98 F (36.7 C), height 4\' 9"  (1.448 m), weight 175 lb (79.4 kg), SpO2 97 %.  Physical Exam Physical Exam Constitutional:      General: She is not in acute distress.    Appearance: She is obese.  HENT:     Head: Normocephalic and atraumatic.     Nose:     Comments: Covered with a mask    Mouth/Throat:     Comments: Covered with a mask Eyes:     General: No scleral icterus.       Right eye: No discharge.        Left eye: No discharge.  Neck:     Comments: No palpable cervical or supraclavicular lymphadenopathy.  The trachea is midline.  No thyromegaly or dominant thyroid masses appreciated.  The gland  moves freely with deglutition. Cardiovascular:     Rate and Rhythm: Normal rate and regular rhythm.  Pulmonary:     Effort: Pulmonary effort is normal. No respiratory distress.     Breath sounds: Normal breath sounds.  Abdominal:  General: Bowel sounds are normal.     Palpations: Abdomen is soft.     Hernia: A hernia is present.     Comments: There is a roughly 8 cm fat bulge adjacent to the umbilicus.  I am unable to palpate the fascial defect, but I suspect this is a periumbilical hernia.  Genitourinary:    Comments: Deferred Musculoskeletal:        General: No swelling or tenderness.  Skin:    General: Skin is warm and dry.  Neurological:     General: No focal deficit present.     Mental Status: She is alert.  Psychiatric:        Mood and Affect: Mood normal.        Behavior: Behavior normal.     Data Reviewed An abdominal ultrasound was performed on March 10, 2020.  I reviewed the images and concur with the radiologist impression which is copied here:  CLINICAL DATA:  Anterior abdominal subcutaneous mass in the periumbilical region.  EXAM: ULTRASOUND ABDOMEN LIMITED  COMPARISON:  CT abdomen 03/19/2011.  FINDINGS: Scanning in the region of concern shows a 7.1 x 4.1 x 6.4 cm echogenic region consistent with the a periumbilical hernia containing only fat. Hernia defect itself measures approximately 1.7 cm in diameter. No sign of herniated bowel.  IMPRESSION: Fat containing periumbilical hernia. This has enlarged since 2012 when it measured about 3.5 cm in maximal dimension.  I also reviewed the CT scan referenced above, performed March 19, 2011  Assessment This is a 69 year old woman with a periumbilical hernia.  It has become increasingly symptomatic.  She also complains of a number of other symptoms including frequent belching, nausea, vomiting, and diarrhea.  I do not think these are in any way related to the hernia.  Plan I have offered her  robot-assisted laparoscopic repair of her hernia.  I discussed the risks of the operation with her.  These include, but are not limited to, bleeding, infection, damage to surrounding tissues or structures, hernia recurrence, mesh complications, problems with wound healing, need to convert to an open operation.  She has agreed to accept these risks and we will work on getting her scheduled for surgery.    Fredirick Maudlin 03/18/2020, 2:29 PM

## 2020-03-18 NOTE — H&P (View-Only) (Signed)
Patient ID: Tiffany Orozco, female   DOB: Oct 07, 1950, 69 y.o.   MRN: [id number]  Chief Complaint  Patient presents with  . New Patient (Initial Visit)    Ventral Hernia    HPI Tiffany Orozco is a 69 y.o. female.   She has been referred for further evaluation of a ventral hernia.  She reports that she has had it for at least 5 years.  About 2 years ago, it began to feel uncomfortable.  She endorses discomfort at the site without radiation.  It does bulge but she is able to reduce it.  It bothers her most when she is sitting or walking for a long time.  She does endorse some nausea, along with diarrhea.  She has previously had 3 abdominal operations including a cholecystectomy in 2003, tubal ligation, and an appendectomy.   Past Medical History:  Diagnosis Date  . Allergy   . Anxiety   . COPD (chronic obstructive pulmonary disease) ([place])   . DDD (degenerative disc disease), lumbar    mild  . Depression   . Female rectocele without uterine prolapse   . GERD (gastroesophageal reflux disease)   . Hearing loss   . Heart murmur   . Hyperlipidemia   . Hypertension   . Hypothyroidism   . IBS (irritable bowel syndrome)   . Influenza A 06/2015  . Obesity   . Osteopenia   . Plantar fasciitis   . Skin cancer of arm   . Skin cancer of face   . Stroke ([place]) 07/2010   no permenant deficits  . Thrombocytopenia ([place])     Past Surgical History:  Procedure Laterality Date  . APPENDECTOMY    . CHOLECYSTECTOMY  2004  . CYST EXCISION    . EMGs  7/04   atms and wrists neg  . ESOPHAGEAL MANOMETRY N/A 03/17/2015   Procedure: ESOPHAGEAL MANOMETRY (EM);  Surgeon: Manus Gunning, MD;  Location: [facility];  Service: Gastroenterology;  Laterality: N/A;  . FOOT SURGERY  03/31/2009   R foot  . FOOT SURGERY  2008, 2009, 2010   right foot   . TUBAL LIGATION  1975    Family History  Problem Relation Age of Onset  . Skin cancer Father   . Hypertension Mother   . Lung cancer Brother    . Colon cancer Sister   . Esophageal cancer Sister   . Ovarian cancer Sister   . Stomach cancer Neg Hx   . Pancreatic cancer Neg Hx     Social History Social History   Tobacco Use  . Smoking status: Current Every Day Smoker    Packs/day: 1.00    Years: 31.00    Pack years: 31.00    Types: Cigarettes  . Smokeless tobacco: Never Used  . Tobacco comment: States she is not smoking during current illness 05/29/18 sb  Vaping Use  . Vaping Use: Never used  Substance Use Topics  . Alcohol use: No    Alcohol/week: 0.0 standard drinks  . Drug use: No    Allergies  Allergen Reactions  . Lisinopril     Shortness of breath.  . Niacin     REACTION: dizziness  . Welchol [Colesevelam Hcl] Other (See Comments)    Diarrhea,Nausea, swelling in face  . Augmentin [Amoxicillin-Pot Clavulanate]     Nauseated. GI upset with taking.  . Codeine     keeps patient awake  . Oxycodone Hcl     Patient states this medication makes her hyper  Current Outpatient Medications  Medication Sig Dispense Refill  . albuterol (VENTOLIN HFA) 108 (90 Base) MCG/ACT inhaler Inhale 2 puffs into the lungs every 6 (six) hours as needed for wheezing or shortness of breath. 18 g 11  . ARMOUR THYROID 90 MG tablet Take one tablet by mouth daily. 90 tablet 3  . azelastine (ASTELIN) 0.1 % nasal spray PLACE 1 SPRAY INTO BOTH NOSTRILS 2 (TWO) TIMES DAILY. 30 mL 5  . Cholecalciferol (VITAMIN D3) 2000 units TABS Take 1 tablet by mouth daily.    . Coenzyme Q10 (COQ10 PO) Take 1 capsule by mouth daily.    Marland Kitchen dipyridamole-aspirin (AGGRENOX) 200-25 MG 12hr capsule Take 1 capsule by mouth 2 (two) times daily. 180 capsule 3  . ezetimibe (ZETIA) 10 MG tablet Take 1 tablet (10 mg total) by mouth daily. 90 tablet 3  . fluticasone (FLONASE) 50 MCG/ACT nasal spray SPRAY 2 SPRAYS INTO EACH NOSTRIL EVERY DAY 48 g 3  . Iodine, Kelp, (KELP PO) Take 600 mg by mouth daily.    . ondansetron (ZOFRAN) 8 MG tablet Take 1 tablet (8 mg  total) by mouth every 8 (eight) hours as needed for nausea or vomiting. 20 tablet 0  . amLODipine (NORVASC) 5 MG tablet Take 1 tablet (5 mg total) by mouth daily. (Patient not taking: Reported on 03/18/2020) 90 tablet 3  . dexlansoprazole (DEXILANT) 60 MG capsule Take 1 capsule (60 mg total) by mouth daily. (Patient not taking: Reported on 03/18/2020) 90 capsule 3  . hydrochlorothiazide (HYDRODIURIL) 25 MG tablet 1 tab po daily prn swelling (Patient not taking: Reported on 03/18/2020) 90 tablet 3  . polyethylene glycol powder (GLYCOLAX/MIRALAX) 17 GM/SCOOP powder MIX 1 CAPFUL (17 GRAMS) IN 8 OUNCES OF LIQUID AND DRINK DAILY AS NEEDED FOR CONSTIPATION. (Patient not taking: Reported on 03/18/2020) 527 g 3   No current facility-administered medications for this visit.    Review of Systems Review of Systems  HENT: Positive for hearing loss and tinnitus.   Respiratory: Positive for shortness of breath.   Cardiovascular: Positive for palpitations.       Orthopnea  Gastrointestinal: Positive for abdominal pain, diarrhea and nausea.       GERD  Skin:       Pruritus  Neurological: Positive for dizziness, speech difficulty and headaches.  Psychiatric/Behavioral: Positive for dysphoric mood.  All other systems reviewed and are negative.   Blood pressure (!) 147/80, pulse 87, temperature 98 F (36.7 C), height 4\' 9"  (1.448 m), weight 175 lb (79.4 kg), SpO2 97 %.  Physical Exam Physical Exam Constitutional:      General: She is not in acute distress.    Appearance: She is obese.  HENT:     Head: Normocephalic and atraumatic.     Nose:     Comments: Covered with a mask    Mouth/Throat:     Comments: Covered with a mask Eyes:     General: No scleral icterus.       Right eye: No discharge.        Left eye: No discharge.  Neck:     Comments: No palpable cervical or supraclavicular lymphadenopathy.  The trachea is midline.  No thyromegaly or dominant thyroid masses appreciated.  The gland  moves freely with deglutition. Cardiovascular:     Rate and Rhythm: Normal rate and regular rhythm.  Pulmonary:     Effort: Pulmonary effort is normal. No respiratory distress.     Breath sounds: Normal breath sounds.  Abdominal:  General: Bowel sounds are normal.     Palpations: Abdomen is soft.     Hernia: A hernia is present.     Comments: There is a roughly 8 cm fat bulge adjacent to the umbilicus.  I am unable to palpate the fascial defect, but I suspect this is a periumbilical hernia.  Genitourinary:    Comments: Deferred Musculoskeletal:        General: No swelling or tenderness.  Skin:    General: Skin is warm and dry.  Neurological:     General: No focal deficit present.     Mental Status: She is alert.  Psychiatric:        Mood and Affect: Mood normal.        Behavior: Behavior normal.     Data Reviewed An abdominal ultrasound was performed on March 10, 2020.  I reviewed the images and concur with the radiologist impression which is copied here:  CLINICAL DATA:  Anterior abdominal subcutaneous mass in the periumbilical region.  EXAM: ULTRASOUND ABDOMEN LIMITED  COMPARISON:  CT abdomen 03/19/2011.  FINDINGS: Scanning in the region of concern shows a 7.1 x 4.1 x 6.4 cm echogenic region consistent with the a periumbilical hernia containing only fat. Hernia defect itself measures approximately 1.7 cm in diameter. No sign of herniated bowel.  IMPRESSION: Fat containing periumbilical hernia. This has enlarged since 2012 when it measured about 3.5 cm in maximal dimension.  I also reviewed the CT scan referenced above, performed March 19, 2011  Assessment This is a 69 year old woman with a periumbilical hernia.  It has become increasingly symptomatic.  She also complains of a number of other symptoms including frequent belching, nausea, vomiting, and diarrhea.  I do not think these are in any way related to the hernia.  Plan I have offered her  robot-assisted laparoscopic repair of her hernia.  I discussed the risks of the operation with her.  These include, but are not limited to, bleeding, infection, damage to surrounding tissues or structures, hernia recurrence, mesh complications, problems with wound healing, need to convert to an open operation.  She has agreed to accept these risks and we will work on getting her scheduled for surgery.    Fredirick Maudlin 03/18/2020, 2:29 PM

## 2020-03-19 ENCOUNTER — Telehealth: Payer: Self-pay | Admitting: General Surgery

## 2020-03-19 NOTE — Telephone Encounter (Signed)
Patient has been advised of Pre-Admission date/time, COVID Testing date and Surgery date.  Surgery Date: 04/14/20 Preadmission Testing Date: 04/07/20 (phone 1p-5p) Covid Testing Date: 04/10/20 - patient advised to go to the [facility] ([place] between 8a-1p   Patient has been made aware to call [phone], between 1-3:00pm the day before surgery, to find out what time to arrive for surgery.

## 2020-03-19 NOTE — Telephone Encounter (Signed)
Per Dr.Cannon to notify patient to that she would need to make an follow up appointment with her Gastroenterologist as the nausea, she is currently experiencing is not related to her hernia. Patient agreed with Dr.Cannon recommendations. Patient denies having any vomiting and patient states the prescription Zofran isn't as helpful.

## 2020-03-19 NOTE — Telephone Encounter (Signed)
Patient states that she is having a continuous feeling with nausea.  No vomiting, just states nausea all the time.  She is taking Zofran 8 mg Q8H and this is not working for her.  She is wondering if there is anything else she can take.  Please call her.  Thank you.

## 2020-03-21 ENCOUNTER — Telehealth: Payer: Self-pay | Admitting: Family Medicine

## 2020-03-21 NOTE — Telephone Encounter (Signed)
Surgical Clearance, office note and labs faxed to  [facility] at [phone].

## 2020-03-21 NOTE — Telephone Encounter (Signed)
Hospital called to get surgical clearance they sent a fax on Tuesday.

## 2020-03-21 NOTE — Telephone Encounter (Signed)
Clearance in outbox.   Note: Pt with recent AMW, reviewed labs. No CP , no SOB at that time.  Hx of CAD, CVA and smoking... moderate risk  but low invasive surgery

## 2020-03-24 NOTE — Progress Notes (Signed)
Medical Clearance has been received from Dr [name]. Patient may hold Aggrenox for 1 week prior to surgery. The patient is cleared at Medium risk for surgery.

## 2020-03-25 ENCOUNTER — Emergency Department (HOSPITAL_COMMUNITY): Payer: Medicare Other

## 2020-03-25 ENCOUNTER — Emergency Department (HOSPITAL_COMMUNITY)
Admission: EM | Admit: 2020-03-25 | Discharge: 2020-03-25 | Disposition: A | Discharge status: Home / Self Care | Payer: Medicare Other | Attending: Emergency Medicine | Admitting: Emergency Medicine

## 2020-03-25 ENCOUNTER — Telehealth: Payer: Self-pay

## 2020-03-25 ENCOUNTER — Encounter (HOSPITAL_COMMUNITY): Payer: Self-pay

## 2020-03-25 ENCOUNTER — Other Ambulatory Visit: Payer: Self-pay

## 2020-03-25 DIAGNOSIS — Z85828 Personal history of other malignant neoplasm of skin: Secondary | ICD-10-CM | POA: Insufficient documentation

## 2020-03-25 DIAGNOSIS — R10816 Epigastric abdominal tenderness: Secondary | ICD-10-CM | POA: Insufficient documentation

## 2020-03-25 DIAGNOSIS — I10 Essential (primary) hypertension: Secondary | ICD-10-CM | POA: Insufficient documentation

## 2020-03-25 DIAGNOSIS — Z79899 Other long term (current) drug therapy: Secondary | ICD-10-CM | POA: Insufficient documentation

## 2020-03-25 DIAGNOSIS — R109 Unspecified abdominal pain: Secondary | ICD-10-CM

## 2020-03-25 DIAGNOSIS — R42 Dizziness and giddiness: Secondary | ICD-10-CM | POA: Diagnosis not present

## 2020-03-25 DIAGNOSIS — R10815 Periumbilic abdominal tenderness: Secondary | ICD-10-CM | POA: Insufficient documentation

## 2020-03-25 DIAGNOSIS — K921 Melena: Secondary | ICD-10-CM | POA: Insufficient documentation

## 2020-03-25 DIAGNOSIS — J449 Chronic obstructive pulmonary disease, unspecified: Secondary | ICD-10-CM | POA: Diagnosis not present

## 2020-03-25 DIAGNOSIS — R197 Diarrhea, unspecified: Secondary | ICD-10-CM | POA: Insufficient documentation

## 2020-03-25 DIAGNOSIS — Z79890 Hormone replacement therapy (postmenopausal): Secondary | ICD-10-CM | POA: Insufficient documentation

## 2020-03-25 DIAGNOSIS — F17210 Nicotine dependence, cigarettes, uncomplicated: Secondary | ICD-10-CM | POA: Diagnosis not present

## 2020-03-25 DIAGNOSIS — R0602 Shortness of breath: Secondary | ICD-10-CM | POA: Diagnosis not present

## 2020-03-25 DIAGNOSIS — E039 Hypothyroidism, unspecified: Secondary | ICD-10-CM | POA: Diagnosis not present

## 2020-03-25 DIAGNOSIS — I517 Cardiomegaly: Secondary | ICD-10-CM | POA: Diagnosis not present

## 2020-03-25 LAB — BASIC METABOLIC PANEL
Anion gap: 9 (ref 5–15)
BUN: 9 mg/dL (ref 8–23)
CO2: 22 mmol/L (ref 22–32)
Calcium: 9 mg/dL (ref 8.9–10.3)
Chloride: 99 mmol/L (ref 98–111)
Creatinine, Ser: 0.69 mg/dL (ref 0.44–1.00)
GFR, Estimated: >60 mL/min (ref >60)
Glucose, Bld: 108 mg/dL — ABNORMAL HIGH (ref 70–99)
Potassium: 4.4 mmol/L (ref 3.5–5.1)
Sodium: 130 mmol/L — ABNORMAL LOW (ref 135–145)

## 2020-03-25 LAB — URINALYSIS, ROUTINE W REFLEX MICROSCOPIC
Bilirubin Urine: NEGATIVE
Glucose, UA: NEGATIVE mg/dL
Hgb urine dipstick: NEGATIVE
Ketones, ur: NEGATIVE mg/dL
Leukocytes,Ua: NEGATIVE
Nitrite: NEGATIVE
Protein, ur: NEGATIVE mg/dL
Specific Gravity, Urine: 1.015 (ref 1.005–1.030)
pH: 5 (ref 5.0–8.0)

## 2020-03-25 LAB — TYPE AND SCREEN
ABO/RH(D): A POS
Antibody Screen: NEGATIVE

## 2020-03-25 LAB — CBC
HCT: 43.5 % (ref 36.0–46.0)
Hemoglobin: 13.8 g/dL (ref 12.0–15.0)
MCH: 26.3 pg (ref 26.0–34.0)
MCHC: 31.7 g/dL (ref 30.0–36.0)
MCV: 82.9 fL (ref 80.0–100.0)
Platelets: 465 10*3/uL — ABNORMAL HIGH (ref 150–400)
RBC: 5.25 MIL/uL — ABNORMAL HIGH (ref 3.87–5.11)
RDW: 16.4 % — ABNORMAL HIGH (ref 11.5–15.5)
WBC: 9.7 10*3/uL (ref 4.0–10.5)
nRBC: 0 % (ref 0.0–0.2)

## 2020-03-25 LAB — HEPATIC FUNCTION PANEL
ALT: 21 U/L (ref 0–44)
AST: 22 U/L (ref 15–41)
Albumin: 3.6 g/dL (ref 3.5–5.0)
Alkaline Phosphatase: 86 U/L (ref 38–126)
Bilirubin, Direct: <0.1 mg/dL (ref 0.0–0.2)
Total Bilirubin: 0.5 mg/dL (ref 0.3–1.2)
Total Protein: 6.6 g/dL (ref 6.5–8.1)

## 2020-03-25 LAB — PROTIME-INR
INR: 1 (ref 0.8–1.2)
Prothrombin Time: 12.7 seconds (ref 11.4–15.2)

## 2020-03-25 LAB — POC OCCULT BLOOD, ED: Fecal Occult Bld: NEGATIVE

## 2020-03-25 MED ORDER — FAMOTIDINE IN NACL 20-0.9 MG/50ML-% IV SOLN
20.0000 mg | Freq: Once | INTRAVENOUS | Status: AC
  Administered 2020-03-25: 20 mg via INTRAVENOUS
  Filled 2020-03-25: qty 50

## 2020-03-25 MED ORDER — SODIUM CHLORIDE 0.9 % IV BOLUS (SEPSIS)
1000.0000 mL | Freq: Once | INTRAVENOUS | Status: AC
  Administered 2020-03-25: 1000 mL via INTRAVENOUS

## 2020-03-25 MED ORDER — OMEPRAZOLE 20 MG PO CPDR: 20.0000 mg | DELAYED_RELEASE_CAPSULE | Freq: Every day | ORAL | 0 refills | Status: DC

## 2020-03-25 MED ORDER — SODIUM CHLORIDE 0.9 % IV SOLN: 1000.0000 mL | INTRAVENOUS | Status: DC

## 2020-03-25 NOTE — Telephone Encounter (Signed)
Pt said she has had liquid diarrhea 4 - 15 x a day that is sometimes colored red and nausea since 03/14/20. Na is low and pt thinks she may be dehydrated.pt urine is darker than usual and is not voiding the same amt as usual (pt voiding less) pt has lost 6-7 lbs.pt has dull consistent pain above naval and when pt tries to eat anything the pain worsens in abd.mouth has dry mouth all the time. Now BP is 141/86 P 85.pt does not have fever. Pt is going to either [facility] ED or [facility] ED. Pt is going to get someone to take her to and ED. FYI to Dr Diona Browner.

## 2020-03-25 NOTE — Telephone Encounter (Signed)
Agree with ER visit given bloody diarrhea

## 2020-03-25 NOTE — Discharge Instructions (Signed)
Take the antacid to see if that helps with your abdominal discomfort.  Continue antinausea medication.  Follow-up with your primary care doctor and consider seeing a GI doctor for further evaluation

## 2020-03-25 NOTE — ED Triage Notes (Signed)
Pt has multiple symptoms: Abd pain, sob, diarrhea and dizziness. Pt is scheduled to have hernia surgery next month. resp e.u

## 2020-03-25 NOTE — ED Notes (Signed)
Patient verbalizes understanding of discharge instructions. Opportunity for questioning and answers were provided. Pt discharged from ED via wheelchair.

## 2020-03-25 NOTE — ED Provider Notes (Signed)
[facility] EMERGENCY DEPARTMENT Provider Note   CSN: [id number] Arrival date & time: 03/25/20  1712     History Chief Complaint  Patient presents with  . Abdominal Pain  . Shortness of Breath  . Dizziness    Tiffany Orozco is a 69 y.o. female.  HPI   Patient presents to the emergency room for evaluation of trouble with abdominal pain, diarrhea and blood in her stool.  Patient states she has been having issues with abdominal pain off-and-on for over a month.  She was noted to have a ventral hernia and was referred to general surgery.  Patient is scheduled to have surgery next month.  Patient continues to have intermittent symptoms with that and will notice that she will have some intermittent swelling.  Patient however in the last 2 days he is worried about blood in her stool.  She also wonders if she is having issues with her ulcer.  Patient also has been having issues with nausea and her surgeon suggested she see a GI doctor.  Patient called her doctor today and mentioned that she has had weight loss as well as darker urine and abdominal pain with eating.  She also mentioned the bloody stools.  She was sent to the ED for further evaluation  Past Medical History:  Diagnosis Date  . Allergy   . Anxiety   . COPD (chronic obstructive pulmonary disease) ([place])   . DDD (degenerative disc disease), lumbar    mild  . Depression   . Female rectocele without uterine prolapse   . GERD (gastroesophageal reflux disease)   . Hearing loss   . Heart murmur   . Hyperlipidemia   . Hypertension   . Hypothyroidism   . IBS (irritable bowel syndrome)   . Influenza A 06/2015  . Obesity   . Osteopenia   . Plantar fasciitis   . Skin cancer of arm   . Skin cancer of face   . Stroke ([place]) 07/2010   no permenant deficits  . Thrombocytopenia [facility])     Patient Active Problem List   Diagnosis Date Noted  . 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  Esophagitis 09/02/2016  . PUD (peptic ulcer disease) 09/02/2016  . Coronary atherosclerosis of native coronary artery 05/04/2016  . Tobacco abuse 06/09/2015  . History of CVA (cerebrovascular accident) 06/08/2015  . IBS (irritable bowel syndrome) 06/08/2015  . Chronic nausea 01/17/2015  . Chronic back pain 07/12/2014  . Dysphagia 04/19/2014  . Fatigue 09/12/2012  . HTN (hypertension) 11/30/2011  . Herniation of rectum into vagina 08/29/2011  . Hyponatremia 03/26/2011  . Dyspnea 03/23/2011  . CONSTIPATION, CHRONIC 03/19/2010  . Osteopenia 03/19/2010  . Eustachian tube dysfunction 10/21/2009  . MURMUR 03/20/2009  . UNSPECIFIED CARDIAC DYSRHYTHMIA 03/06/2009  . Allergic rhinitis 08/14/2007  . Hypothyroidism 02/27/2007  . HYPERCHOLESTEROLEMIA 02/27/2007  . Generalized anxiety disorder 02/27/2007  . Depression, major, single episode, moderate ([place]) 02/27/2007  . GERD 02/27/2007    Past Surgical History:  Procedure Laterality Date  . APPENDECTOMY    . CHOLECYSTECTOMY  2004  . CYST EXCISION    . EMGs  7/04   atms and wrists neg  . ESOPHAGEAL MANOMETRY N/A 03/17/2015   Procedure: ESOPHAGEAL MANOMETRY (EM);  Surgeon: Manus Gunning, MD;  Location: [facility] ENDOSCOPY;  Service: Gastroenterology;  Laterality: N/A;  . FOOT SURGERY  03/31/2009   R foot  . FOOT SURGERY  2008, 2009, 2010   right foot   .  TUBAL LIGATION  1975     OB History    Gravida  5   Para  2   Term  2   Preterm  0   AB  3   Living  2     SAB  2   TAB  0   Ectopic  1   Multiple  0   Live Births  2           Family History  Problem Relation Age of Onset  . Skin cancer Father   . Hypertension Mother   . Lung cancer Brother   . Colon cancer Sister   . Esophageal cancer Sister   . Ovarian cancer Sister   . Stomach cancer Neg Hx   . Pancreatic cancer Neg Hx     Social History   Tobacco Use  . Smoking status: Current Every Day Smoker    Packs/day: 1.00    Years: 31.00    Pack  years: 31.00    Types: Cigarettes  . Smokeless tobacco: Never Used  . Tobacco comment: States she is not smoking during current illness 05/29/18 sb  Vaping Use  . Vaping Use: Never used  Substance Use Topics  . Alcohol use: No    Alcohol/week: 0.0 standard drinks  . Drug use: No    Home Medications Prior to Admission medications   Medication Sig Start Date End Date Taking? Authorizing Provider  albuterol (VENTOLIN HFA) 108 (90 Base) MCG/ACT inhaler Inhale 2 puffs into the lungs every 6 (six) hours as needed for wheezing or shortness of breath. 02/29/20   Bedsole, Amy E, MD  amLODipine (NORVASC) 5 MG tablet Take 1 tablet (5 mg total) by mouth daily. Patient not taking: Reported on 03/18/2020 02/29/20   Jinny Sanders, MD  ARMOUR THYROID 90 MG tablet Take one tablet by mouth daily. 02/29/20   Bedsole, Amy E, MD  azelastine (ASTELIN) 0.1 % nasal spray PLACE 1 SPRAY INTO BOTH NOSTRILS 2 (TWO) TIMES DAILY. 01/24/19   Bedsole, Amy E, MD  Cholecalciferol (VITAMIN D3) 2000 units TABS Take 1 tablet by mouth daily.    [provider]  Coenzyme Q10 (COQ10 PO) Take 1 capsule by mouth daily.    [provider]  dexlansoprazole (DEXILANT) 60 MG capsule Take 1 capsule (60 mg total) by mouth daily. Patient not taking: Reported on 03/18/2020 02/29/20   Jinny Sanders, MD  dipyridamole-aspirin (AGGRENOX) 200-25 MG 12hr capsule Take 1 capsule by mouth 2 (two) times daily. 02/29/20   Bedsole, Amy E, MD  ezetimibe (ZETIA) 10 MG tablet Take 1 tablet (10 mg total) by mouth daily. 02/29/20   Bedsole, Amy E, MD  fluticasone (FLONASE) 50 MCG/ACT nasal spray SPRAY 2 SPRAYS INTO EACH NOSTRIL EVERY DAY 02/29/20   Bedsole, Amy E, MD  hydrochlorothiazide (HYDRODIURIL) 25 MG tablet 1 tab po daily prn swelling Patient not taking: Reported on 03/18/2020 02/29/20   Jinny Sanders, MD  Iodine, Kelp, (KELP PO) Take 600 mg by mouth daily.    [provider]  omeprazole (PRILOSEC) 20 MG capsule Take 1  capsule (20 mg total) by mouth daily. 03/25/20   Dorie Rank, MD  ondansetron (ZOFRAN) 8 MG tablet Take 1 tablet (8 mg total) by mouth every 8 (eight) hours as needed for nausea or vomiting. 03/17/20   Bedsole, Amy E, MD  polyethylene glycol powder (GLYCOLAX/MIRALAX) 17 GM/SCOOP powder MIX 1 CAPFUL (17 GRAMS) IN 8 OUNCES OF LIQUID AND DRINK DAILY AS NEEDED FOR  CONSTIPATION. Patient not taking: Reported on 03/18/2020 02/29/20   Jinny Sanders, MD    Allergies    Lisinopril, Niacin, Welchol [colesevelam hcl], Augmentin [amoxicillin-pot clavulanate], Codeine, and Oxycodone hcl  Review of Systems   Review of Systems  All other systems reviewed and are negative.   Physical Exam Updated Vital Signs BP (!) 150/80 (BP Location: Right Arm)   Pulse 68   Temp 97.7 F (36.5 C) (Oral)   Resp 16   SpO2 97%   Physical Exam Vitals and nursing note reviewed.  Constitutional:      General: She is not in acute distress.    Appearance: She is well-developed.  HENT:     Head: Normocephalic and atraumatic.     Right Ear: External ear normal.     Left Ear: External ear normal.  Eyes:     General: No scleral icterus.       Right eye: No discharge.        Left eye: No discharge.     Conjunctiva/sclera: Conjunctivae normal.  Neck:     Trachea: No tracheal deviation.  Cardiovascular:     Rate and Rhythm: Normal rate and regular rhythm.  Pulmonary:     Effort: Pulmonary effort is normal. No respiratory distress.     Breath sounds: Normal breath sounds. No stridor. No wheezing or rales.  Abdominal:     General: Bowel sounds are normal. There is no distension.     Palpations: Abdomen is soft.     Tenderness: There is abdominal tenderness in the epigastric area and periumbilical area. There is no guarding or rebound.     Comments: Abdomen protuberant, mild tenderness periumbilical region, no large hernia appreciated  Genitourinary:    Comments: No gross blood noted on rectal exam Musculoskeletal:         General: No tenderness.     Cervical back: Neck supple.  Skin:    General: Skin is warm and dry.     Findings: No rash.  Neurological:     Mental Status: She is alert.     Cranial Nerves: No cranial nerve deficit (no facial droop, extraocular movements intact, no slurred speech).     Sensory: No sensory deficit.     Motor: No abnormal muscle tone or seizure activity.     Coordination: Coordination normal.     ED Results / Procedures / Treatments   Labs (all labs ordered are listed, but only abnormal results are displayed) Labs Reviewed  BASIC METABOLIC PANEL - Abnormal; Notable for the following components:      Result Value   Sodium 130 (*)    Glucose, Bld 108 (*)    All other components within normal limits  CBC - Abnormal; Notable for the following components:   RBC 5.25 (*)    RDW 16.4 (*)    Platelets 465 (*)    All other components within normal limits  URINALYSIS, ROUTINE W REFLEX MICROSCOPIC - Abnormal; Notable for the following components:   APPearance CLOUDY (*)    All other components within normal limits  PROTIME-INR  HEPATIC FUNCTION PANEL  POC OCCULT BLOOD, ED  TYPE AND SCREEN    EKG EKG Interpretation  Date/Time:  Tuesday March 25 2020 17:13:43 EDT Ventricular Rate:  89 PR Interval:  150 QRS Duration: 76 QT Interval:  358 QTC Calculation: 435 R Axis:   49 Text Interpretation: Normal sinus rhythm Low voltage QRS Borderline ECG No significant change since last tracing Confirmed by Tomi Bamberger,  Wille Glaser [phone]) on 03/25/2020 5:40:06 PM Also confirmed by Dorie Rank [phone]), editor Hattie Perch [phone])  on 03/26/2020 2:36:14 PM   Radiology DG Chest 2 View  Result Date: 03/25/2020 CLINICAL DATA:  Abdominal pain.  Shortness of breath.  Dizziness. EXAM: CHEST - 2 VIEW COMPARISON:  05/29/2018 FINDINGS: Lower thoracic spondylosis. Midline trachea. Borderline cardiomegaly. Right cardiophrenic angle blunting, secondary to a prominent epicardial fat pad on  prior CT. Atherosclerosis in the transverse aorta. No pleural effusion or pneumothorax. Clear lungs. IMPRESSION: Borderline cardiomegaly, without acute disease. Aortic Atherosclerosis (ICD10-I70.0). Electronically Signed   By: Abigail Miyamoto M.D.   On: 03/25/2020 17:41    Procedures Procedures (including critical care time)  Medications Ordered in ED Medications  sodium chloride 0.9 % bolus 1,000 mL (0 mLs Intravenous Stopped 03/25/20 2321)  famotidine (PEPCID) IVPB 20 mg premix (0 mg Intravenous Stopped 03/25/20 2321)    ED Course  I have reviewed the triage vital signs and the nursing notes.  Pertinent labs & imaging results that were available during my care of the patient were reviewed by me and considered in my medical decision making (see chart for details).  Clinical Course as of Mar 26 1548  Tue Mar 25, 2020  1751 Chest x-ray without acute finding   [JK]  1820 Sodium decreased at 130 but not significantly different than baseline   [JK]  1820 Hemoccult stool negative   [JK]  2126 LFTs are reassuring.  INR is normal   [JK]    Clinical Course User Index [JK] Dorie Rank, MD   MDM Rules/Calculators/A&P                          Patient was sent to the ED for evaluation of some nausea mild upper abdominal discomfort.  Patient has history of ventral hernia but no evidence to suggest incarceration or strangulation based on exam.  Laboratory tests are reassuring.  I doubt acute infection.  Based on her evaluation work-up I doubt cholecystitis, pancreatitis, diverticulitis or obstructive process.  No evidence of GI bleeding.  Patient was treated with IV fluids and antacids.  Symptoms have improved.  Will discharge home with antacid.  She does have Zofran.  Patient was noted to be hyponatremic but this is stable compared to baseline. Final Clinical Impression(s) / ED Diagnoses Final diagnoses:  Abdominal pain, unspecified abdominal location    Rx / DC Orders ED Discharge Orders          Ordered    omeprazole (PRILOSEC) 20 MG capsule  Daily        03/25/20 2245           Dorie Rank, MD 03/26/20 1550

## 2020-03-31 ENCOUNTER — Telehealth: Payer: Self-pay | Admitting: *Deleted

## 2020-03-31 NOTE — Telephone Encounter (Signed)
Patient left a voicemail stating that she has several questions about her upcoming surgery. Patient stated that she had requested that her surgery be scheduled in [place]. Patient stated that her surgery is scheduled at [facility] and wants to make sure that they are afiliated with [facility] Patient stated that she has been told to stop her Aggrenox on 04/08/20 and that is her stroke medication. Patient stated that her surgery is not scheduled until 04/14/20. Patient stated that she is not sure if it is a good idea to stop her Aggrenox since she is off of her blood pressure medication. Patient stated that she would like these concerns addressed.

## 2020-04-02 NOTE — Telephone Encounter (Signed)
Call  [place]  [facility] is associated/ owned by [facility].  Typically Aggrenox is stopped prior to surgery given increased risk for bleeding.  Why is she not taking her BP meds? Or does she mean they told her to stop prior to surgery?

## 2020-04-02 NOTE — Telephone Encounter (Signed)
Tiffany Orozco notified as instructed by telephone.  She states Dr. Diona Browner stopped her BP medication, fluid pill and Dexilant.  She was also instructed to decrease her water intake due to her sodium being low.

## 2020-04-03 NOTE — Telephone Encounter (Signed)
Faythe Ghee... found note on lab report 02/29/2020 from several labs ago. I was confused as  she asked in setting of upcoming surgery. HCTZ  and dexilant held given low NA.... remove from med list.  As long as her BP is still running < 140/90 off the amlodipine she can remain off prior to surgery. If still at goal remove from med list. Please document where BP has been recently. Thanks.

## 2020-04-03 NOTE — Telephone Encounter (Signed)
Spoke with Tiffany Orozco to get BP readings:  BP readings provided:  147/76 131/75 121/71 141/74  Patient advised it is okay to remain off blood pressure medication.  Medication list updated.

## 2020-04-04 ENCOUNTER — Other Ambulatory Visit: Payer: Self-pay | Admitting: General Surgery

## 2020-04-04 DIAGNOSIS — K429 Umbilical hernia without obstruction or gangrene: Secondary | ICD-10-CM

## 2020-04-07 ENCOUNTER — Encounter
Admission: RE | Admit: 2020-04-07 | Discharge: 2020-04-07 | Disposition: A | Discharge status: Home / Self Care | Payer: Medicare Other | Source: Ambulatory Visit | Attending: General Surgery | Admitting: General Surgery

## 2020-04-07 ENCOUNTER — Other Ambulatory Visit: Payer: Self-pay

## 2020-04-07 HISTORY — DX: Unspecified tubal pregnancy without intrauterine pregnancy: O00.109

## 2020-04-07 HISTORY — DX: Pneumonia, unspecified organism: J18.9

## 2020-04-07 NOTE — Patient Instructions (Addendum)
Your procedure is scheduled on: Monday, November 15 Report to the Registration Desk on the 1st floor of the [facility]. To find out your arrival time, please call [phone] between 1PM - 3PM on: Friday, November 12  REMEMBER: Instructions that are not followed completely may result in serious medical risk, up to and including death; or upon the discretion of your surgeon and anesthesiologist your surgery may need to be rescheduled.  Do not eat food after midnight the night before surgery.  No gum chewing, lozengers or hard candies.  You may however, drink CLEAR liquids up to 2 hours before you are scheduled to arrive for your surgery. Do not drink anything within 2 hours of your scheduled arrival time.  Clear liquids include: - water  - apple juice without pulp - gatorade (not RED, PURPLE, OR BLUE) - black coffee or tea (Do NOT add milk or creamers to the coffee or tea) Do NOT drink anything that is not on this list.  TAKE THESE MEDICATIONS THE MORNING OF SURGERY WITH A SIP OF WATER:  1.  Albuterol inhaler 2.  Armour thyroid  Use inhalers on the day of surgery and bring to the hospital.  Follow recommendations from Cardiologist, Pulmonologist or PCP regarding stopping Aggrenox. Stop 1 week prior to surgery. Resume as directed by your provider.  One week prior to surgery: Stop Anti-inflammatories (NSAIDS) such as Advil, Aleve, Ibuprofen, Motrin, Naproxen, Naprosyn and Aspirin based products such as Excedrin, Goodys Powder, BC Powder. Stop ANY OVER THE COUNTER supplements until after surgery. (Coenzyme Q-10) (However, you may continue taking Vitamin D up until the day before surgery.)  No Alcohol for 24 hours before or after surgery.  No Smoking including e-cigarettes for 24 hours prior to surgery.  No chewable tobacco products for at least 6 hours prior to surgery.  No nicotine patches on the day of surgery.  Do not use any "recreational" drugs for at least a week prior  to your surgery.  Please be advised that the combination of cocaine and anesthesia may have negative outcomes, up to and including death. If you test positive for cocaine, your surgery will be cancelled.  On the morning of surgery brush your teeth with toothpaste and water, you may rinse your mouth with mouthwash if you wish. Do not swallow any toothpaste or mouthwash.  Do not wear jewelry, make-up, hairpins, clips or nail polish.  Do not wear lotions, powders, or perfumes.   Do not shave 48 hours prior to surgery.   Contact lenses, hearing aids and dentures may not be worn into surgery.  Do not bring valuables to the hospital. [facility] is not responsible for any missing/lost belongings or valuables.   Use CHG Soap as directed on instruction sheet.  Notify your doctor if there is any change in your medical condition (cold, fever, infection).  Wear comfortable clothing (specific to your surgery type) to the hospital.  Plan for stool softeners for home use; pain medications have a tendency to cause constipation. You can also help prevent constipation by eating foods high in fiber such as fruits and vegetables and drinking plenty of fluids as your diet allows.  After surgery, you can help prevent lung complications by doing breathing exercises.  Take deep breaths and cough every 1-2 hours. Your doctor may order a device called an Incentive Spirometer to help you take deep breaths. When coughing or sneezing, hold a pillow firmly against your incision with both hands. This is called "splinting." Doing  this helps protect your incision. It also decreases belly discomfort.  If you are being discharged the day of surgery, you will not be allowed to drive home. You will need a responsible adult (18 years or older) to drive you home and stay with you that night.   If you are taking public transportation, you will need to have a responsible adult (18 years or older) with you. Please confirm  with your physician that it is acceptable to use public transportation.   Please call the [facility] at [phone] if you have any questions about these instructions.  Visitation Policy:  Patients undergoing a surgery or procedure may have one family member or support person with them as long as that person is not COVID-19 positive or experiencing its symptoms.  That person may remain in the waiting area during the procedure.

## 2020-04-09 ENCOUNTER — Other Ambulatory Visit: Payer: Self-pay | Admitting: Family Medicine

## 2020-04-09 NOTE — Telephone Encounter (Signed)
Please sign and close encounter if completed.

## 2020-04-09 NOTE — Telephone Encounter (Signed)
Last office visit 02/29/2020 for Tiffany Orozco.  Last refilled 03/17/2020 for #20 with no refills.  No future appointments with PCP.

## 2020-04-10 ENCOUNTER — Other Ambulatory Visit: Payer: Self-pay

## 2020-04-10 ENCOUNTER — Other Ambulatory Visit
Admission: RE | Admit: 2020-04-10 | Discharge: 2020-04-10 | Disposition: A | Discharge status: Home / Self Care | Payer: Medicare Other | Source: Ambulatory Visit | Attending: General Surgery | Admitting: General Surgery

## 2020-04-10 DIAGNOSIS — Z20822 Contact with and (suspected) exposure to covid-19: Secondary | ICD-10-CM | POA: Diagnosis not present

## 2020-04-10 DIAGNOSIS — Z01812 Encounter for preprocedural laboratory examination: Secondary | ICD-10-CM | POA: Diagnosis not present

## 2020-04-10 LAB — BASIC METABOLIC PANEL
Anion gap: 8 (ref 5–15)
BUN: 10 mg/dL (ref 8–23)
CO2: 24 mmol/L (ref 22–32)
Calcium: 8.7 mg/dL — ABNORMAL LOW (ref 8.9–10.3)
Chloride: 102 mmol/L (ref 98–111)
Creatinine, Ser: 0.56 mg/dL (ref 0.44–1.00)
GFR, Estimated: >60 mL/min (ref >60)
Glucose, Bld: 100 mg/dL — ABNORMAL HIGH (ref 70–99)
Potassium: 3.7 mmol/L (ref 3.5–5.1)
Sodium: 134 mmol/L — ABNORMAL LOW (ref 135–145)

## 2020-04-11 LAB — SARS CORONAVIRUS 2 (TAT 6-24 HRS): SARS Coronavirus 2: NEGATIVE

## 2020-04-14 ENCOUNTER — Other Ambulatory Visit: Payer: Self-pay

## 2020-04-14 ENCOUNTER — Ambulatory Visit
Admission: RE | Admit: 2020-04-14 | Discharge: 2020-04-14 | Disposition: A | Discharge status: Home / Self Care | Payer: Medicare Other | Attending: General Surgery | Admitting: General Surgery

## 2020-04-14 ENCOUNTER — Encounter
Admission: RE | Disposition: A | Discharge status: Home / Self Care | Payer: Self-pay | Source: Home / Self Care | Attending: General Surgery

## 2020-04-14 ENCOUNTER — Ambulatory Visit: Payer: Medicare Other | Admitting: Urgent Care

## 2020-04-14 ENCOUNTER — Telehealth: Payer: Self-pay

## 2020-04-14 ENCOUNTER — Encounter: Payer: Self-pay | Admitting: General Surgery

## 2020-04-14 DIAGNOSIS — F17210 Nicotine dependence, cigarettes, uncomplicated: Secondary | ICD-10-CM | POA: Diagnosis not present

## 2020-04-14 DIAGNOSIS — Z7982 Long term (current) use of aspirin: Secondary | ICD-10-CM | POA: Insufficient documentation

## 2020-04-14 DIAGNOSIS — I10 Essential (primary) hypertension: Secondary | ICD-10-CM | POA: Diagnosis not present

## 2020-04-14 DIAGNOSIS — J449 Chronic obstructive pulmonary disease, unspecified: Secondary | ICD-10-CM | POA: Diagnosis not present

## 2020-04-14 DIAGNOSIS — E7800 Pure hypercholesterolemia, unspecified: Secondary | ICD-10-CM | POA: Diagnosis not present

## 2020-04-14 DIAGNOSIS — Z885 Allergy status to narcotic agent status: Secondary | ICD-10-CM | POA: Insufficient documentation

## 2020-04-14 DIAGNOSIS — K436 Other and unspecified ventral hernia with obstruction, without gangrene: Secondary | ICD-10-CM | POA: Insufficient documentation

## 2020-04-14 DIAGNOSIS — Z79890 Hormone replacement therapy (postmenopausal): Secondary | ICD-10-CM | POA: Diagnosis not present

## 2020-04-14 DIAGNOSIS — K429 Umbilical hernia without obstruction or gangrene: Secondary | ICD-10-CM

## 2020-04-14 DIAGNOSIS — Z888 Allergy status to other drugs, medicaments and biological substances status: Secondary | ICD-10-CM | POA: Diagnosis not present

## 2020-04-14 DIAGNOSIS — Z9049 Acquired absence of other specified parts of digestive tract: Secondary | ICD-10-CM | POA: Insufficient documentation

## 2020-04-14 DIAGNOSIS — K589 Irritable bowel syndrome without diarrhea: Secondary | ICD-10-CM | POA: Diagnosis not present

## 2020-04-14 DIAGNOSIS — Z880 Allergy status to penicillin: Secondary | ICD-10-CM | POA: Diagnosis not present

## 2020-04-14 DIAGNOSIS — Z79899 Other long term (current) drug therapy: Secondary | ICD-10-CM | POA: Diagnosis not present

## 2020-04-14 DIAGNOSIS — K439 Ventral hernia without obstruction or gangrene: Secondary | ICD-10-CM | POA: Diagnosis not present

## 2020-04-14 DIAGNOSIS — E785 Hyperlipidemia, unspecified: Secondary | ICD-10-CM | POA: Diagnosis not present

## 2020-04-14 DIAGNOSIS — Z881 Allergy status to other antibiotic agents status: Secondary | ICD-10-CM | POA: Insufficient documentation

## 2020-04-14 DIAGNOSIS — E039 Hypothyroidism, unspecified: Secondary | ICD-10-CM | POA: Diagnosis not present

## 2020-04-14 HISTORY — PX: XI ROBOTIC ASSISTED VENTRAL HERNIA: SHX6789

## 2020-04-14 SURGERY — REPAIR, HERNIA, VENTRAL, ROBOT-ASSISTED
Anesthesia: General

## 2020-04-14 MED ORDER — FENTANYL CITRATE (PF) 100 MCG/2ML IJ SOLN
25.0000 ug | INTRAMUSCULAR | Status: DC | PRN
  Administered 2020-04-14 (×4): 50 ug via INTRAVENOUS

## 2020-04-14 MED ORDER — BUPIVACAINE LIPOSOME 1.3 % IJ SUSP: 20.0000 mL | Freq: Once | INTRAMUSCULAR | Status: DC

## 2020-04-14 MED ORDER — TRAMADOL HCL 50 MG PO TABS
50.0000 mg | ORAL_TABLET | Freq: Four times a day (QID) | ORAL | 0 refills | Status: DC | PRN
Start: 2020-04-14 — End: 2020-10-22

## 2020-04-14 MED ORDER — SUCCINYLCHOLINE CHLORIDE 20 MG/ML IJ SOLN
INTRAMUSCULAR | Status: DC | PRN
  Administered 2020-04-14: 100 mg via INTRAVENOUS

## 2020-04-14 MED ORDER — CHLORHEXIDINE GLUCONATE CLOTH 2 % EX PADS
6.0000 | MEDICATED_PAD | Freq: Once | CUTANEOUS | Status: AC
  Administered 2020-04-14: 6 via TOPICAL

## 2020-04-14 MED ORDER — GABAPENTIN 300 MG PO CAPS: 300.0000 mg | ORAL_CAPSULE | ORAL | Status: AC

## 2020-04-14 MED ORDER — PROPOFOL 10 MG/ML IV BOLUS
INTRAVENOUS | Status: DC | PRN
  Administered 2020-04-14: 100 mg via INTRAVENOUS

## 2020-04-14 MED ORDER — LIDOCAINE-EPINEPHRINE 1 %-1:100000 IJ SOLN
INTRAMUSCULAR | Status: AC
  Filled 2020-04-14: qty 1

## 2020-04-14 MED ORDER — BUPIVACAINE HCL (PF) 0.25 % IJ SOLN
INTRAMUSCULAR | Status: AC
  Filled 2020-04-14: qty 30

## 2020-04-14 MED ORDER — LACTATED RINGERS IV SOLN: INTRAVENOUS | Status: DC

## 2020-04-14 MED ORDER — ACETAMINOPHEN 500 MG PO TABS: 1000.0000 mg | ORAL_TABLET | Freq: Four times a day (QID) | ORAL | 0 refills | Status: AC | PRN

## 2020-04-14 MED ORDER — CELECOXIB 200 MG PO CAPS: 200.0000 mg | ORAL_CAPSULE | ORAL | Status: AC

## 2020-04-14 MED ORDER — DEXAMETHASONE SODIUM PHOSPHATE 10 MG/ML IJ SOLN
INTRAMUSCULAR | Status: DC | PRN
  Administered 2020-04-14: 8 mg via INTRAVENOUS

## 2020-04-14 MED ORDER — ACETAMINOPHEN 500 MG PO TABS: 1000.0000 mg | ORAL_TABLET | ORAL | Status: AC

## 2020-04-14 MED ORDER — MEPERIDINE HCL 50 MG/ML IJ SOLN: 6.2500 mg | INTRAMUSCULAR | Status: DC | PRN

## 2020-04-14 MED ORDER — CHLORHEXIDINE GLUCONATE 0.12 % MT SOLN
OROMUCOSAL | Status: AC
  Administered 2020-04-14: 15 mL via OROMUCOSAL
  Filled 2020-04-14: qty 15

## 2020-04-14 MED ORDER — TRAMADOL HCL 50 MG PO TABS
ORAL_TABLET | ORAL | Status: AC
  Filled 2020-04-14: qty 1

## 2020-04-14 MED ORDER — FENTANYL CITRATE (PF) 100 MCG/2ML IJ SOLN
INTRAMUSCULAR | Status: AC
  Filled 2020-04-14: qty 2

## 2020-04-14 MED ORDER — CELECOXIB 200 MG PO CAPS
ORAL_CAPSULE | ORAL | Status: AC
  Administered 2020-04-14: 200 mg via ORAL
  Filled 2020-04-14: qty 1

## 2020-04-14 MED ORDER — PHENYLEPHRINE HCL (PRESSORS) 10 MG/ML IV SOLN
INTRAVENOUS | Status: DC | PRN
  Administered 2020-04-14: 100 ug via INTRAVENOUS

## 2020-04-14 MED ORDER — ONDANSETRON HCL 4 MG/2ML IJ SOLN
INTRAMUSCULAR | Status: DC | PRN
  Administered 2020-04-14: 4 mg via INTRAVENOUS

## 2020-04-14 MED ORDER — TRAMADOL HCL 50 MG PO TABS
50.0000 mg | ORAL_TABLET | Freq: Once | ORAL | Status: AC
  Administered 2020-04-14: 50 mg via ORAL
  Filled 2020-04-14: qty 1

## 2020-04-14 MED ORDER — LIDOCAINE HCL (CARDIAC) PF 100 MG/5ML IV SOSY
PREFILLED_SYRINGE | INTRAVENOUS | Status: DC | PRN
  Administered 2020-04-14: 80 mg via INTRAVENOUS

## 2020-04-14 MED ORDER — CEFAZOLIN SODIUM-DEXTROSE 2-4 GM/100ML-% IV SOLN
INTRAVENOUS | Status: AC
  Filled 2020-04-14: qty 100

## 2020-04-14 MED ORDER — FAMOTIDINE 20 MG PO TABS: 20.0000 mg | ORAL_TABLET | Freq: Once | ORAL | Status: AC

## 2020-04-14 MED ORDER — FAMOTIDINE 20 MG PO TABS
ORAL_TABLET | ORAL | Status: AC
  Administered 2020-04-14: 20 mg via ORAL
  Filled 2020-04-14: qty 1

## 2020-04-14 MED ORDER — BUPIVACAINE-EPINEPHRINE (PF) 0.25% -1:200000 IJ SOLN
INTRAMUSCULAR | Status: DC | PRN
  Administered 2020-04-14: 25 mL

## 2020-04-14 MED ORDER — LACTATED RINGERS IV SOLN: INTRAVENOUS | Status: DC | PRN

## 2020-04-14 MED ORDER — IBUPROFEN 800 MG PO TABS: 800.0000 mg | ORAL_TABLET | Freq: Three times a day (TID) | ORAL | 0 refills | Status: DC | PRN

## 2020-04-14 MED ORDER — PROMETHAZINE HCL 25 MG/ML IJ SOLN: 6.2500 mg | INTRAMUSCULAR | Status: DC | PRN

## 2020-04-14 MED ORDER — CEFAZOLIN SODIUM-DEXTROSE 2-4 GM/100ML-% IV SOLN
2.0000 g | INTRAVENOUS | Status: AC
  Administered 2020-04-14: 2 g via INTRAVENOUS

## 2020-04-14 MED ORDER — PROPOFOL 10 MG/ML IV BOLUS
INTRAVENOUS | Status: AC
  Filled 2020-04-14: qty 20

## 2020-04-14 MED ORDER — ORAL CARE MOUTH RINSE: 15.0000 mL | Freq: Once | OROMUCOSAL | Status: AC

## 2020-04-14 MED ORDER — GABAPENTIN 300 MG PO CAPS
ORAL_CAPSULE | ORAL | Status: AC
  Administered 2020-04-14: 300 mg via ORAL
  Filled 2020-04-14: qty 1

## 2020-04-14 MED ORDER — ACETAMINOPHEN 500 MG PO TABS
ORAL_TABLET | ORAL | Status: AC
  Administered 2020-04-14: 1000 mg via ORAL
  Filled 2020-04-14: qty 2

## 2020-04-14 MED ORDER — EPHEDRINE SULFATE 50 MG/ML IJ SOLN
INTRAMUSCULAR | Status: DC | PRN
  Administered 2020-04-14: 10 mg via INTRAVENOUS

## 2020-04-14 MED ORDER — CHLORHEXIDINE GLUCONATE 0.12 % MT SOLN: 15.0000 mL | Freq: Once | OROMUCOSAL | Status: AC

## 2020-04-14 MED ORDER — FENTANYL CITRATE (PF) 100 MCG/2ML IJ SOLN
INTRAMUSCULAR | Status: DC | PRN
  Administered 2020-04-14 (×2): 50 ug via INTRAVENOUS

## 2020-04-14 MED ORDER — ROCURONIUM BROMIDE 100 MG/10ML IV SOLN
INTRAVENOUS | Status: DC | PRN
  Administered 2020-04-14: 45 mg via INTRAVENOUS
  Administered 2020-04-14: 5 mg via INTRAVENOUS
  Administered 2020-04-14: 20 mg via INTRAVENOUS

## 2020-04-14 SURGICAL SUPPLY — 56 items
"PENCIL ELECTRO HAND CTR " (MISCELLANEOUS) ×1 IMPLANT
ADH SKN CLS APL DERMABOND .7 (GAUZE/BANDAGES/DRESSINGS) ×1
APL PRP STRL LF DISP 70% ISPRP (MISCELLANEOUS) ×1
CANISTER SUCT 1200ML W/VALVE (MISCELLANEOUS) ×1 IMPLANT
CHLORAPREP W/TINT 26 (MISCELLANEOUS) ×2 IMPLANT
COVER TIP SHEARS 8 DVNC (MISCELLANEOUS) ×1 IMPLANT
COVER TIP SHEARS 8MM DA VINCI (MISCELLANEOUS) ×2
COVER WAND RF STERILE (DRAPES) ×2 IMPLANT
DEFOGGER SCOPE WARMER CLEARIFY (MISCELLANEOUS) ×2 IMPLANT
DERMABOND ADVANCED (GAUZE/BANDAGES/DRESSINGS) ×1
DERMABOND ADVANCED .7 DNX12 (GAUZE/BANDAGES/DRESSINGS) ×1 IMPLANT
DRAPE ARM DVNC X/XI (DISPOSABLE) ×3 IMPLANT
DRAPE COLUMN DVNC XI (DISPOSABLE) ×1 IMPLANT
DRAPE DA VINCI XI ARM (DISPOSABLE) ×6
DRAPE DA VINCI XI COLUMN (DISPOSABLE) ×2
ELECT CAUTERY BLADE TIP 2.5 (TIP) ×2
ELECT REM PT RETURN 9FT ADLT (ELECTROSURGICAL) ×2
ELECTRODE CAUTERY BLDE TIP 2.5 (TIP) ×1 IMPLANT
ELECTRODE REM PT RTRN 9FT ADLT (ELECTROSURGICAL) ×1 IMPLANT
GLOVE BIO SURGEON STRL SZ 6.5 (GLOVE) ×4 IMPLANT
GLOVE INDICATOR 7.0 STRL GRN (GLOVE) ×4 IMPLANT
GOWN STRL REUS W/ TWL LRG LVL3 (GOWN DISPOSABLE) ×3 IMPLANT
GOWN STRL REUS W/TWL LRG LVL3 (GOWN DISPOSABLE) ×6
IRRIGATOR SUCT 8 DISP DVNC XI (IRRIGATION / IRRIGATOR) IMPLANT
IRRIGATOR SUCTION 8MM XI DISP (IRRIGATION / IRRIGATOR)
IV NS 1000ML (IV SOLUTION)
IV NS 1000ML BAXH (IV SOLUTION) IMPLANT
KIT PINK PAD W/HEAD ARE REST (MISCELLANEOUS) ×2
KIT PINK PAD W/HEAD ARM REST (MISCELLANEOUS) ×1 IMPLANT
LABEL OR SOLS (LABEL) ×2 IMPLANT
MANIFOLD NEPTUNE II (INSTRUMENTS) ×2 IMPLANT
MESH VENTRALIGHT ST 4.5IN (Mesh General) ×1 IMPLANT
MESH VENTRALIGHT ST 6X8 (Mesh Specialty) IMPLANT
MESH VENTRLGHT ELLIPSE 8X6XMFL (Mesh Specialty) IMPLANT
NDL INSUFFLATION 14GA 120MM (NEEDLE) IMPLANT
NEEDLE HYPO 22GX1.5 SAFETY (NEEDLE) ×2 IMPLANT
NEEDLE INSUFFLATION 14GA 120MM (NEEDLE) IMPLANT
NS IRRIG 500ML POUR BTL (IV SOLUTION) ×2 IMPLANT
OBTURATOR OPTICAL STANDARD 8MM (TROCAR) ×2
OBTURATOR OPTICAL STND 8 DVNC (TROCAR) ×1
OBTURATOR OPTICALSTD 8 DVNC (TROCAR) ×1 IMPLANT
PACK LAP CHOLECYSTECTOMY (MISCELLANEOUS) ×2 IMPLANT
PENCIL ELECTRO HAND CTR (MISCELLANEOUS) ×2 IMPLANT
SEAL CANN UNIV 5-8 DVNC XI (MISCELLANEOUS) ×3 IMPLANT
SEAL XI 5MM-8MM UNIVERSAL (MISCELLANEOUS) ×6
SET TUBE SMOKE EVAC HIGH FLOW (TUBING) ×2 IMPLANT
SOLUTION ELECTROLUBE (MISCELLANEOUS) ×2 IMPLANT
STRIP CLOSURE SKIN 1/2X4 (GAUZE/BANDAGES/DRESSINGS) ×2 IMPLANT
SUT MNCRL 4-0 (SUTURE) ×2
SUT MNCRL 4-0 27XMFL (SUTURE) ×1
SUT STRATAFIX PDS 30 CT-1 (SUTURE) ×1 IMPLANT
SUT V-LOC 90 ABS 3-0 VLT  V-20 (SUTURE) ×2
SUT V-LOC 90 ABS 3-0 VLT V-20 (SUTURE) IMPLANT
SUT VICRYL 0 AB UR-6 (SUTURE) ×2 IMPLANT
SUTURE MNCRL 4-0 27XMF (SUTURE) ×1 IMPLANT
TAPE TRANSPORE STRL 2 31045 (GAUZE/BANDAGES/DRESSINGS) ×1 IMPLANT

## 2020-04-14 NOTE — Anesthesia Procedure Notes (Signed)
Procedure Name: Intubation Date/Time: 04/14/2020 7:40 AM Performed by: Allean Found, CRNA Pre-anesthesia Checklist: Patient identified, Patient being monitored, Timeout performed, Emergency Drugs available and Suction available Patient Re-evaluated:Patient Re-evaluated prior to induction Oxygen Delivery Method: Circle system utilized Preoxygenation: Pre-oxygenation with 100% oxygen Induction Type: IV induction Ventilation: Mask ventilation without difficulty Laryngoscope Size: 3 and McGraph Grade View: Grade I Tube type: Oral Tube size: 7.0 mm Number of attempts: 1 Airway Equipment and Method: Stylet Placement Confirmation: ETT inserted through vocal cords under direct vision,  positive ETCO2 and breath sounds checked- equal and bilateral Secured at: 21 cm Tube secured with: Tape Dental Injury: Teeth and Oropharynx as per pre-operative assessment

## 2020-04-14 NOTE — Anesthesia Postprocedure Evaluation (Signed)
Anesthesia Post Note  Patient: [name]  Procedure(s) Performed: XI ROBOTIC ASSISTED VENTRAL HERNIA (N/A )  Patient location during evaluation: PACU Anesthesia Type: General Level of consciousness: awake and alert and oriented Pain management: pain level controlled Vital Signs Assessment: post-procedure vital signs reviewed and stable Respiratory status: spontaneous breathing, nonlabored ventilation and respiratory function stable Cardiovascular status: blood pressure returned to baseline and stable Postop Assessment: no signs of nausea or vomiting Anesthetic complications: no   No complications documented.   Last Vitals:  Vitals:   04/14/20 1129 04/14/20 1142  BP: 137/74 (!) 155/76  Pulse: 89 89  Resp: 17 16  Temp: (!) 36.3 C (!) 36.1 C  SpO2: 95% 97%    Last Pain:  Vitals:   04/14/20 1142  TempSrc: Temporal  PainSc: 4                  Lady Wisham

## 2020-04-14 NOTE — Discharge Instructions (Signed)

## 2020-04-14 NOTE — Telephone Encounter (Signed)
Pt left v/m that she had two hernia repairs earlier today and pt is doing well; pt was released from hospital at 1:30. Pt cannot do a lot for 6 wks. Pt has been off aggrenox for 7 days and pt wants to know when she can restart the aggrenox. Also the ED physician on 03/25/20 at [facility] ED gave pt 14 day supply of omeprazole 20 mg taking one daily and pt said that was for her ulcer and the omeprazole seemed to really help and pt wants to know if she can have one refill of the omeprazole. Pt request cb after reviewed; pt would like to restart aggrenox tonight if that is possible. Sending note to Dr Diona Browner who is out of office and Butch Penny CMA.

## 2020-04-14 NOTE — Transfer of Care (Signed)
Immediate Anesthesia Transfer of Care Note  Patient: Tiffany Orozco  Procedure(s) Performed: XI ROBOTIC ASSISTED VENTRAL HERNIA (N/A )  Patient Location: PACU  Anesthesia Type:General  Level of Consciousness: awake, alert  and sedated  Airway & Oxygen Therapy: Patient Spontanous Breathing and Patient connected to face mask oxygen  Post-op Assessment: Report given to RN and Post -op Vital signs reviewed and stable  Post vital signs: Reviewed and stable  Last Vitals:  Vitals Value Taken Time  BP 171/72 04/14/20 1022  Temp 36.4 C 04/14/20 1022  Pulse 87 04/14/20 1027  Resp 21 04/14/20 1027  SpO2 99 % 04/14/20 1027  Vitals shown include unvalidated device data.  Last Pain:  Vitals:   04/14/20 1022  TempSrc:   PainSc: 0-No pain         Complications: No complications documented.

## 2020-04-14 NOTE — Anesthesia Preprocedure Evaluation (Signed)
Anesthesia Evaluation  Patient identified by MRN, date of birth, ID band Patient awake    Reviewed: Allergy & Precautions, NPO status , Patient's Chart, lab work & pertinent test results  History of Anesthesia Complications Negative for: history of anesthetic complications  Airway Mallampati: II  TM Distance: >3 FB Neck ROM: Full    Dental  (+) Poor Dentition, Missing   Pulmonary COPD,  COPD inhaler, Current Smoker and Patient abstained from smoking.,    breath sounds clear to auscultation- rhonchi (-) wheezing      Cardiovascular hypertension, Pt. on medications (-) CAD, (-) Past MI, (-) Cardiac Stents and (-) CABG  Rhythm:Regular Rate:Normal - Systolic murmurs and - Diastolic murmurs    Neuro/Psych PSYCHIATRIC DISORDERS Anxiety Depression CVA (R sided weakness, occasional slurred speech), Residual Symptoms    GI/Hepatic Neg liver ROS, PUD, GERD  ,  Endo/Other  neg diabetesHypothyroidism   Renal/GU negative Renal ROS     Musculoskeletal  (+) Arthritis ,   Abdominal (+) + obese,   Peds  Hematology negative hematology ROS (+)   Anesthesia Other Findings Past Medical History: No date: Allergy No date: Anxiety No date: COPD (chronic obstructive pulmonary disease) (HCC) No date: DDD (degenerative disc disease), lumbar     Comment:  mild No date: Depression No date: Female rectocele without uterine prolapse No date: GERD (gastroesophageal reflux disease) No date: Hearing loss No date: Heart murmur No date: Hyperlipidemia No date: Hypertension No date: Hypothyroidism No date: IBS (irritable bowel syndrome) 06/2015: Influenza A No date: Obesity No date: Osteopenia No date: Plantar fasciitis No date: Pneumonia No date: Skin cancer of arm No date: Skin cancer of face 07/2010: Stroke [facility])     Comment:  no permenant deficits No date: Thrombocytopenia ([place]) No date: Tubal pregnancy   Reproductive/Obstetrics                              Anesthesia Physical Anesthesia Plan  ASA: III  Anesthesia Plan: General   Post-op Pain Management:    Induction: Intravenous  PONV Risk Score and Plan: 1 and Ondansetron and Dexamethasone  Airway Management Planned: Oral ETT  Additional Equipment:   Intra-op Plan:   Post-operative Plan: Extubation in OR  Informed Consent: I have reviewed the patients History and Physical, chart, labs and discussed the procedure including the risks, benefits and alternatives for the proposed anesthesia with the patient or authorized representative who has indicated his/her understanding and acceptance.     Dental advisory given  Plan Discussed with: CRNA and Anesthesiologist  Anesthesia Plan Comments:         Anesthesia Quick Evaluation

## 2020-04-14 NOTE — Op Note (Signed)
Robot-Assisted Laparoscopic Ventral Hernia Repair (IPOM) with Mesh   Pre-operative Diagnosis: ventral hernia    Post-operative Diagnosis: same; incarcerated   Surgeon: Fredirick Maudlin, MD, FACS   Anesthesia: GETA   Findings: A significant portion of the omentum was entrapped within the hernia defect. There was a second, smaller defect just caudal to the main defect, which was included in the repair.            Procedure in detail: The patient was seen again in the holding room. The benefits, complications, treatment options, and expected outcomes were discussed with the patient. The risks of bleeding, infection, recurrence of symptoms, failure to resolve symptoms, bowel injury, mesh placement, mesh infection, any of which could require further surgery were reviewed with the patient. The likelihood of improving the patient's symptoms with return to their baseline status is good.  The patient and/or family concurred with the proposed plan, giving informed consent.   The patient was brought to the operating room and placed supine on the OR table.  All bony prominences were padded.  Prior to the induction of general anesthesia, antibiotic prophylaxis was administered. VTE prophylaxis was in place. General endotracheal anesthesia was then administered and tolerated well.  The patient was then sterilely prepped and draped in standard fashion.   The Veress needle was used to gain access to the abdomen. Anterior peritoneal placement was confirmed via the saline drop test. Pneumoperitoneum was achieved without any hemodynamic compromise. Optiview technique was then used to enter the abdomen with an 8 mm robotic trocar. Two additional robotic 8 mm ports were placed under direct visualization.  All port sites were infiltrated with local anesthesia before placement. The hernia was visualized with laparoscopy. Two separate defects were identified. The main defect appeared to be roughly 2 cm and the smaller was  approximately 1/2 cm.   The robot was brought to the surgical field and docked in standard fashion.  All instruments were kept under direct vision at all times and there was no collision between the robotic arms. I scrubbed out and went to the console.   The contents of the hernia were reduced back into the abdomen. The peritoneum was incised and dissected circumferentially off of the fascia surrounding the defect.  The hernia sac was reduced.  The peritoneum was transected, yielding 2 flaps with adequate exposure of the posterior abdominal wall fascia to permit good adherence of our mesh.  I confirmed that the defect was 2 cm and selected an 4.5 cm Ventralight ST mesh. The hernia defect was closed using an 0 Stratafix suture.  The mesh was then introduced into the abdomen and unfurled, keeping the coated side towards the abdominal contents.  The needle from the Stratafix suture was used to pin the mesh to the now closed defect in a chandelier fashion.   The mesh was secured circumferentially to the posterior abdominal wall using 3-0 V-Loc sutures.   Care was taken to ensure that the mesh lay flat against the abdominal wall without any buckling and without any tension.   The suture needles were removed under direct visualization.  The instruments were removed and the robot was undocked and I scrubbed back in. The laparoscopic ports were removed under direct visualization and the pneumoperitoneum was released.   Each incision was closed with subcuticular 4-0 Monocryl. Additional local anesthetic was injected along the area of the fascial defect. The skin was cleaned.  Dermabond and Steri-Strips were applied.  The patient was awakened, extubated, and taken  to the postanesthesia care unit in good condition.  EBL: Less than 5 cc   Complications: none immediately apparent   Count: All needles instruments and sponges were counted and reported to be correct number at the end of the operation.  Fredirick Maudlin, MD FACS

## 2020-04-14 NOTE — Interval H&P Note (Signed)
History and Physical Interval Note:  04/14/2020 7:19 AM  Tiffany Orozco  has presented today for surgery, with the diagnosis of Ventral hernia.  The various methods of treatment have been discussed with the patient and family. After consideration of risks, benefits and other options for treatment, the patient has consented to  Procedure(s): XI ROBOTIC [place] (N/A) as a surgical intervention.  The patient's history has been reviewed, patient examined, no change in status, stable for surgery.  I have reviewed the patient's chart and labs.  Questions were answered to the patient's satisfaction.     Fredirick Maudlin

## 2020-04-15 MED ORDER — OMEPRAZOLE 20 MG PO CPDR
20.0000 mg | DELAYED_RELEASE_CAPSULE | Freq: Every day | ORAL | 0 refills | Status: DC
Start: 2020-04-15 — End: 2020-06-27

## 2020-04-15 NOTE — Telephone Encounter (Signed)
Ms. Podgurski notified as instructed by telephone.  Refill on Omeprazole sent to [facility] in [place] as instructed by Dr. Diona Browner.

## 2020-04-15 NOTE — Telephone Encounter (Signed)
Okay to restart aggrenox. Okay to send in refill of omeprazole.

## 2020-04-16 ENCOUNTER — Telehealth: Payer: Self-pay | Admitting: General Surgery

## 2020-04-16 NOTE — Telephone Encounter (Signed)
Incoming call from patient.  States she has been having a lot of diarrhea, about 11 to 15 times, feels some warm but hasn't checked her temperature.  States also having a lot of abdominal pain.  Please call her.  Thank you.

## 2020-04-16 NOTE — Telephone Encounter (Signed)
Spoke with patient. Patient states she had diarrhea last night and today. She states she has only taken a small sip of water with medications today. Patient instructed to try imodium, increase fluids to prevent dehydration. Denies incision drainage, or redness. Denies fever. She checked temperature and 98.6. She states she is having some discomfort, pain is bearable. Instructed to drink Gatorade or Pedialyte.

## 2020-04-20 ENCOUNTER — Encounter: Payer: Self-pay | Admitting: Emergency Medicine

## 2020-04-20 ENCOUNTER — Emergency Department: Payer: Medicare Other

## 2020-04-20 ENCOUNTER — Other Ambulatory Visit: Payer: Self-pay

## 2020-04-20 ENCOUNTER — Emergency Department
Admission: EM | Admit: 2020-04-20 | Discharge: 2020-04-20 | Disposition: A | Discharge status: Home / Self Care | Payer: Medicare Other | Attending: Emergency Medicine | Admitting: Emergency Medicine

## 2020-04-20 DIAGNOSIS — J449 Chronic obstructive pulmonary disease, unspecified: Secondary | ICD-10-CM | POA: Insufficient documentation

## 2020-04-20 DIAGNOSIS — F17210 Nicotine dependence, cigarettes, uncomplicated: Secondary | ICD-10-CM | POA: Insufficient documentation

## 2020-04-20 DIAGNOSIS — E039 Hypothyroidism, unspecified: Secondary | ICD-10-CM | POA: Insufficient documentation

## 2020-04-20 DIAGNOSIS — I10 Essential (primary) hypertension: Secondary | ICD-10-CM | POA: Insufficient documentation

## 2020-04-20 DIAGNOSIS — Z79899 Other long term (current) drug therapy: Secondary | ICD-10-CM | POA: Diagnosis not present

## 2020-04-20 DIAGNOSIS — R112 Nausea with vomiting, unspecified: Secondary | ICD-10-CM | POA: Insufficient documentation

## 2020-04-20 DIAGNOSIS — R1110 Vomiting, unspecified: Secondary | ICD-10-CM | POA: Diagnosis not present

## 2020-04-20 DIAGNOSIS — R000 Tachycardia, unspecified: Secondary | ICD-10-CM | POA: Diagnosis not present

## 2020-04-20 LAB — TROPONIN I (HIGH SENSITIVITY): Troponin I (High Sensitivity): 5 ng/L (ref <18)

## 2020-04-20 LAB — CBC
HCT: 41.3 % (ref 36.0–46.0)
Hemoglobin: 13.6 g/dL (ref 12.0–15.0)
MCH: 26.6 pg (ref 26.0–34.0)
MCHC: 32.9 g/dL (ref 30.0–36.0)
MCV: 80.7 fL (ref 80.0–100.0)
Platelets: 510 10*3/uL — ABNORMAL HIGH (ref 150–400)
RBC: 5.12 MIL/uL — ABNORMAL HIGH (ref 3.87–5.11)
RDW: 15.9 % — ABNORMAL HIGH (ref 11.5–15.5)
WBC: 10.8 10*3/uL — ABNORMAL HIGH (ref 4.0–10.5)
nRBC: 0 % (ref 0.0–0.2)

## 2020-04-20 LAB — COMPREHENSIVE METABOLIC PANEL
ALT: 24 U/L (ref 0–44)
AST: 18 U/L (ref 15–41)
Albumin: 4 g/dL (ref 3.5–5.0)
Alkaline Phosphatase: 95 U/L (ref 38–126)
Anion gap: 14 (ref 5–15)
BUN: 9 mg/dL (ref 8–23)
CO2: 20 mmol/L — ABNORMAL LOW (ref 22–32)
Calcium: 9.1 mg/dL (ref 8.9–10.3)
Chloride: 96 mmol/L — ABNORMAL LOW (ref 98–111)
Creatinine, Ser: 0.55 mg/dL (ref 0.44–1.00)
GFR, Estimated: >60 mL/min (ref >60)
Glucose, Bld: 107 mg/dL — ABNORMAL HIGH (ref 70–99)
Potassium: 3.6 mmol/L (ref 3.5–5.1)
Sodium: 130 mmol/L — ABNORMAL LOW (ref 135–145)
Total Bilirubin: 0.6 mg/dL (ref 0.3–1.2)
Total Protein: 7.7 g/dL (ref 6.5–8.1)

## 2020-04-20 LAB — LIPASE, BLOOD: Lipase: 18 U/L (ref 11–51)

## 2020-04-20 MED ORDER — ONDANSETRON HCL 4 MG/2ML IJ SOLN
4.0000 mg | Freq: Once | INTRAMUSCULAR | Status: AC
  Administered 2020-04-20: 4 mg via INTRAVENOUS
  Filled 2020-04-20: qty 2

## 2020-04-20 MED ORDER — IOHEXOL 300 MG/ML  SOLN
100.0000 mL | Freq: Once | INTRAMUSCULAR | Status: AC | PRN
  Administered 2020-04-20: 100 mL via INTRAVENOUS

## 2020-04-20 MED ORDER — SODIUM CHLORIDE 0.9 % IV BOLUS
1000.0000 mL | Freq: Once | INTRAVENOUS | Status: AC
  Administered 2020-04-20: 1000 mL via INTRAVENOUS

## 2020-04-20 MED ORDER — PROMETHAZINE HCL 25 MG PO TABS: 25.0000 mg | ORAL_TABLET | Freq: Four times a day (QID) | ORAL | 0 refills | Status: DC | PRN

## 2020-04-20 NOTE — ED Notes (Signed)
Waiting for patient's husband to arrive with clothing to DC

## 2020-04-20 NOTE — ED Notes (Signed)
Provided DC instructions. Verbalized understanding. Waiting for husband to bring pants.

## 2020-04-20 NOTE — ED Provider Notes (Signed)
[facility] Emergency Department Provider Note  Time seen: 3:53 PM  I have reviewed the triage vital signs and the nursing notes.   HISTORY  Chief Complaint Emesis   HPI Tiffany Orozco is a 69 y.o. female with a past medical history of COPD, gastric reflux, hypertension, hyperlipidemia, presents to the emergency department for nausea vomiting and diarrhea.  According to the patient she had a hernia surgery 6 days ago by Dr. Celine Ahr.   States since the surgery she has been very nauseated with frequent episodes of vomiting, difficulty keeping fluids or food down as well as frequent episodes of diarrhea.  States some pain around her umbilicus moderate dull pain.  States she is also been experiencing gastric reflux with a burning sensation into her chest and throat with a sour taste in her mouth per patient.  Denies any shortness of breath or fever.  Past Medical History:  Diagnosis Date  . Allergy   . Anxiety   . COPD (chronic obstructive pulmonary disease) ([place])   . DDD (degenerative disc disease), lumbar    mild  . Depression   . Female rectocele without uterine prolapse   . GERD (gastroesophageal reflux disease)   . Hearing loss   . Heart murmur   . Hyperlipidemia   . Hypertension   . Hypothyroidism   . IBS (irritable bowel syndrome)   . Influenza A 06/2015  . Obesity   . Osteopenia   . Plantar fasciitis   . Pneumonia   . Skin cancer of arm   . Skin cancer of face   . Stroke ([place]) 07/2010   no permenant deficits  . Thrombocytopenia ([place])   . Tubal pregnancy     Patient Active Problem List   Diagnosis Date Noted  . Umbilical hernia without obstruction and without gangrene   . 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 Esophagitis 09/02/2016  . PUD (peptic ulcer disease) 09/02/2016  . Coronary atherosclerosis of native coronary artery 05/04/2016  . Tobacco abuse 06/09/2015  . History of CVA (cerebrovascular accident) 06/08/2015   . IBS (irritable bowel syndrome) 06/08/2015  . Chronic nausea 01/17/2015  . Chronic back pain 07/12/2014  . Dysphagia 04/19/2014  . Fatigue 09/12/2012  . HTN (hypertension) 11/30/2011  . Herniation of rectum into vagina 08/29/2011  . Hyponatremia 03/26/2011  . Dyspnea 03/23/2011  . CONSTIPATION, CHRONIC 03/19/2010  . Osteopenia 03/19/2010  . Eustachian tube dysfunction 10/21/2009  . MURMUR 03/20/2009  . UNSPECIFIED CARDIAC DYSRHYTHMIA 03/06/2009  . Allergic rhinitis 08/14/2007  . Hypothyroidism 02/27/2007  . HYPERCHOLESTEROLEMIA 02/27/2007  . Generalized anxiety disorder 02/27/2007  . Depression, major, single episode, moderate ([place]) 02/27/2007  . GERD 02/27/2007    Past Surgical History:  Procedure Laterality Date  . APPENDECTOMY    . CHOLECYSTECTOMY  2004  . COLONOSCOPY    . CYST EXCISION Right    breast mole  . EMGs  7/04   atms and wrists neg  . ESOPHAGEAL MANOMETRY N/A 03/17/2015   Procedure: ESOPHAGEAL MANOMETRY (EM);  Surgeon: Manus Gunning, MD;  Location: [facility];  Service: Gastroenterology;  Laterality: N/A;  . FOOT SURGERY Right 2008, 2009, 2010   torn ligament, hammertoes  . TUBAL LIGATION  1975  . UPPER GI ENDOSCOPY    . XI ROBOTIC ASSISTED VENTRAL HERNIA N/A 04/14/2020   Procedure: XI ROBOTIC ASSISTED VENTRAL HERNIA;  Surgeon: Fredirick Maudlin, MD;  Location: [facility] ORS;  Service: General;  Laterality: N/A;    Prior to Admission  medications   Medication Sig Start Date End Date Taking? Authorizing Provider  acetaminophen (TYLENOL) 500 MG tablet Take 2 tablets (1,000 mg total) by mouth every 6 (six) hours as needed for up to 14 days. 04/14/20 04/28/20  Fredirick Maudlin, MD  albuterol (VENTOLIN HFA) 108 (90 Base) MCG/ACT inhaler Inhale 2 puffs into the lungs every 6 (six) hours as needed for wheezing or shortness of breath. 02/29/20   Bedsole, Mervyn Gay, MD  ARMOUR THYROID 90 MG tablet Take one tablet by mouth daily. 02/29/20   Bedsole, Amy E, MD   azelastine (ASTELIN) 0.1 % nasal spray Place 1-2 sprays into both nostrils 2 (two) times daily as needed for allergies. Use in each nostril as directed    [provider]  Cholecalciferol (VITAMIN D3) 125 MCG (5000 UT) TABS Take 5,000 Units by mouth daily.    [provider]  Coenzyme Q10 (COQ10 PO) Take 1 capsule by mouth daily. Patient not taking: Reported on 04/07/2020    [provider]  dipyridamole-aspirin (AGGRENOX) 200-25 MG 12hr capsule Take 1 capsule by mouth 2 (two) times daily. 02/29/20   Bedsole, Amy E, MD  ezetimibe (ZETIA) 10 MG tablet Take 1 tablet (10 mg total) by mouth daily. 02/29/20   Bedsole, Amy E, MD  fluticasone (FLONASE) 50 MCG/ACT nasal spray Place 1-2 sprays into both nostrils daily as needed for allergies or rhinitis.    [provider]  ibuprofen (ADVIL) 800 MG tablet Take 1 tablet (800 mg total) by mouth every 8 (eight) hours as needed. 04/14/20   Fredirick Maudlin, MD  omeprazole (PRILOSEC) 20 MG capsule Take 1 capsule (20 mg total) by mouth daily. 04/15/20   Bedsole, Amy E, MD  ondansetron (ZOFRAN) 8 MG tablet TAKE 1 TABLET BY MOUTH EVERY 8 HOURS AS NEEDED FOR NAUSEA OR VOMITING. 04/09/20   Bedsole, Amy E, MD  polyethylene glycol powder (GLYCOLAX/MIRALAX) 17 GM/SCOOP powder MIX 1 CAPFUL (17 GRAMS) IN 8 OUNCES OF LIQUID AND DRINK DAILY AS NEEDED FOR CONSTIPATION. 02/29/20   Bedsole, Amy E, MD  traMADol (ULTRAM) 50 MG tablet Take 1 tablet (50 mg total) by mouth every 6 (six) hours as needed. 04/14/20 04/14/21  Fredirick Maudlin, MD    Allergies  Allergen Reactions  . Lisinopril Shortness Of Breath  . Niacin Other (See Comments)     dizziness  . Welchol [Colesevelam Hcl] Diarrhea, Nausea Only, Swelling and Other (See Comments)     swelling in face  . Augmentin [Amoxicillin-Pot Clavulanate] Nausea Only    GI upset with taking.  . Codeine     keeps patient awake  . Oxycodone Hcl     Patient states this medication makes her hyper     Family History  Problem Relation Age of Onset  . Skin cancer Father   . Hypertension Mother   . Lung cancer Brother   . Colon cancer Sister   . Esophageal cancer Sister   . Ovarian cancer Sister   . Stomach cancer Neg Hx   . Pancreatic cancer Neg Hx     Social History Social History   Tobacco Use  . Smoking status: Current Every Day Smoker    Packs/day: 1.00    Years: 31.00    Pack years: 31.00    Types: Cigarettes  . Smokeless tobacco: Never Used  . Tobacco comment: States she is not smoking during current illness 05/29/18 sb  Vaping Use  . Vaping Use: Never used  Substance Use Topics  . Alcohol use: No  Alcohol/week: 0.0 standard drinks  . Drug use: No    Review of Systems Constitutional: Negative for fever. Cardiovascular: Negative for chest pain. Respiratory: Negative for shortness of breath. Gastrointestinal: Positive for abdominal pain around her umbilicus.  Positive nausea vomiting diarrhea Genitourinary: Negative for urinary compaints Musculoskeletal: Negative for musculoskeletal complaints Neurological: Negative for headache All other ROS negative  ____________________________________________   PHYSICAL EXAM:  VITAL SIGNS: ED Triage Vitals  Enc Vitals Group     BP 04/20/20 1240 (!) 155/68     Pulse Rate 04/20/20 1240 99     Resp 04/20/20 1240 16     Temp 04/20/20 1240 98.6 F (37 C)     Temp Source 04/20/20 1240 Oral     SpO2 04/20/20 1240 95 %     Weight 04/20/20 1238 165 lb (74.8 kg)     Height 04/20/20 1238 5' (1.524 m)     Head Circumference --      Peak Flow --      Pain Score 04/20/20 1238 10     Pain Loc --      Pain Edu? --      Excl. in [place]? --     Constitutional: Alert and oriented. Well appearing and in no distress. Eyes: Normal exam ENT      Head: Normocephalic and atraumatic.      Mouth/Throat: Mucous membranes are moist. Cardiovascular: Normal rate, regular rhythm Respiratory: Normal respiratory effort without  tachypnea nor retractions. Breath sounds are clear Gastrointestinal: Soft, mild tenderness around the umbilicus but there is a palpable mass just above the umbilicus, question possible hematoma versus hernia. Musculoskeletal: Nontender with normal range of motion in all extremities. Neurologic:  Normal speech and language. No gross focal neurologic deficits  Skin:  Skin is warm, dry and intact.  Psychiatric: Mood and affect are normal.  ____________________________________________    EKG  EKG viewed and interpreted by myself shows sinus tachycardia 104 bpm with a narrow QRS, normal axis, normal intervals, no concerning ST changes.  ____________________________________________    RADIOLOGY  CT scan appears show postsurgical changes with possible seroma.  ____________________________________________   INITIAL IMPRESSION / ASSESSMENT AND PLAN / ED COURSE  Pertinent labs & imaging results that were available during my care of the patient were reviewed by me and considered in my medical decision making (see chart for details).   Patient presents to the emergency department for nausea vomiting diarrhea 6 days after a hernia repair performed by Dr. Celine Ahr.  I reviewed the patient's record it appears on 04/14/2020 patient had a ventral hernia repair.  Patient's labs are overall reassuring.  We will treat nausea, IV hydrate and obtain a CT scan to further evaluate.  Patient agreeable to plan of care.  CT most consistent with postsurgical changes and postsurgical seroma. I spoke to Dr. Christian Mate who has reviewed the CT images, no acute findings. Patient feels much better after IV fluids. Patient states Zofran does not work very well for her we will try Phenergan for the patient. She will follow up with Dr. Celine Ahr by calling tomorrow. Discussed return precautions. Patient agreeable to plan of care. Patient appears very well currently.  Tiffany Orozco was evaluated in Emergency Department on  04/20/2020 for the symptoms described in the history of present illness. She was evaluated in the context of the global COVID-19 pandemic, which necessitated consideration that the patient might be at risk for infection with the SARS-CoV-2 virus that causes COVID-19. Institutional protocols and algorithms that  pertain to the evaluation of patients at risk for COVID-19 are in a state of rapid change based on information released by regulatory bodies including the CDC and federal and state organizations. These policies and algorithms were followed during the patient's care in the ED.  ____________________________________________   FINAL CLINICAL IMPRESSION(S) / ED DIAGNOSES  Nausea vomiting diarrhea   Harvest Dark, MD 04/20/20 1738

## 2020-04-20 NOTE — ED Triage Notes (Signed)
Pt to ED via POV stating that she had hernia repair surgery on Monday. Pt states that on Tuesday she started having N/V/D. Pt states that he stools are green. Pt reports that she is not able to keep anything down and that she thinks that she is dehydrated. Pt is in NAD at this time, talking on cell phone during triage.

## 2020-04-20 NOTE — ED Triage Notes (Signed)
First Rn Note: pt to ED via POV with c/o N/V s/p vental hernia repair on 11/15. Pt states increased N/V to the point is unable to keep liquids down, has vomited her medications up today. Pt states feels like she is having difficulty breathing due the stomach acid burning her throat. Pt noted to be continuously dry heaving/vomiting in the lobby.

## 2020-04-20 NOTE — Discharge Instructions (Signed)
Please take your nausea medication as prescribed. Please call Dr. Celine Ahr tomorrow to inform her of today's ER visit and your symptoms. Return to the emergency department for any abdominal pain, fever, worsening nausea vomiting or diarrhea.

## 2020-05-08 ENCOUNTER — Encounter: Payer: Self-pay | Admitting: General Surgery

## 2020-05-08 ENCOUNTER — Ambulatory Visit (INDEPENDENT_AMBULATORY_CARE_PROVIDER_SITE_OTHER): Payer: Medicare Other | Admitting: General Surgery

## 2020-05-08 ENCOUNTER — Other Ambulatory Visit: Payer: Self-pay

## 2020-05-08 VITALS — BP 159/84 | HR 92 | Temp 97.9°F | Ht 59.0 in | Wt 167.6 lb

## 2020-05-08 DIAGNOSIS — Z8719 Personal history of other diseases of the digestive system: Secondary | ICD-10-CM

## 2020-05-08 DIAGNOSIS — Z9889 Other specified postprocedural states: Secondary | ICD-10-CM

## 2020-05-08 NOTE — Progress Notes (Signed)
Tiffany Orozco is here today for a postoperative visit.  She underwent a robot-assisted laparoscopic ventral hernia repair with mesh on April 14, 2020.  Significant portion of her omentum was entrapped within the main hernia defect.  There was a second smaller defect, just caudal to the main, which was included in the repair.  On November 21, she did present to the emergency department with diarrhea, dehydration, and nausea.  She was treated and released.  She says that this has resolved.  Today, she reports that she is having some discomfort on her right side which makes it difficult to sit.  She also endorses discomfort around the umbilicus and she states that she has to wear her pants low to avoid rubbing on the area.  She denies any erythema, induration, or drainage at her trocar sites.  She states that she is not taking anything for pain at this time.  Today's Vitals   05/08/20 0854  BP: (!) 159/84  Pulse: 92  Temp: 97.9 F (36.6 C)  SpO2: 98%  Weight: 167 lb 9.6 oz (76 kg)  Height: 4\' 11"  (1.499 m)   Body mass index is 33.85 kg/m. Focused abdominal examination demonstrates that the port sites are healing nicely.  There is no concern for infection.  At the umbilicus, there is inflammation and swelling, and amount of that would be expected at this stage of her recovery.  Her abdomen is soft and nondistended.  Impression and plan: This is a 69 year old woman who underwent a robot-assisted ventral hernia repair.  Overall, I think she is healing well.  I think much of her discomfort may be ascribed to normal postoperative inflammation, for which she has not been taking any medication.  I have recommended that she alternate ibuprofen and Tylenol on a scheduled basis for the next 5 days, using tramadol in between for breakthrough discomfort.  I think this will help with the inflammation and potential muscle splinting that she has been doing.  She was reassured that the swelling near her umbilicus  will resolve with time, but that it can take several months.  I will see her on an as-needed basis.

## 2020-05-08 NOTE — Patient Instructions (Addendum)
May take 1 extra strength Tylenol alternated with 600-800 mg of Ibuprofen every 3 hours. So take Tylenol, then 3 hours later take the Ibuprofen, then 3 hours later take the Tylenol. If you need more pain control you may try the Tramadol as well.   May use ice to the area for comfort several times a day.  GENERAL POST-OPERATIVE PATIENT INSTRUCTIONS   WOUND CARE INSTRUCTIONS:  Keep a dry clean dressing on the wound if there is drainage. The initial bandage may be removed after 24 hours.  Once the wound has quit draining you may leave it open to air.  If clothing rubs against the wound or causes irritation and the wound is not draining you may cover it with a dry dressing during the daytime.  Try to keep the wound dry and avoid ointments on the wound unless directed to do so.  If the wound becomes bright red and painful or starts to drain infected material that is not clear, please contact your physician immediately.  If the wound is mildly pink and has a thick firm ridge underneath it, this is normal, and is referred to as a healing ridge.  This will resolve over the next 4-6 weeks.  BATHING: You may shower if you have been informed of this by your surgeon. However, Please do not submerge in a tub, hot tub, or pool until incisions are completely sealed or have been told by your surgeon that you may do so.  DIET:  You may eat any foods that you can tolerate.  It is a good idea to eat a high fiber diet and take in plenty of fluids to prevent constipation.  If you do become constipated you may want to take a mild laxative or take ducolax tablets on a daily basis until your bowel habits are regular.  Constipation can be very uncomfortable, along with straining, after recent surgery.  ACTIVITY:  You are encouraged to cough and deep breath or use your incentive spirometer if you were given one, every 15-30 minutes when awake.  This will help prevent respiratory complications and low grade fevers  post-operatively if you had a general anesthetic.  You may want to hug a pillow when coughing and sneezing to add additional support to the surgical area, if you had abdominal or chest surgery, which will decrease pain during these times.  You are encouraged to walk and engage in light activity for the next two weeks.  You should not lift more than 15-20 pounds for 6 weeks after surgery as it could put you at increased risk for complications.  Twenty pounds is roughly equivalent to a plastic bag of groceries. At that time- Listen to your body when lifting, if you have pain when lifting, stop and then try again in a few days. Soreness after doing exercises or activities of daily living is normal as you get back in to your normal routine.  MEDICATIONS:  Try to take narcotic medications and anti-inflammatory medications, such as tylenol, ibuprofen, naprosyn, etc., with food.  This will minimize stomach upset from the medication.  Should you develop nausea and vomiting from the pain medication, or develop a rash, please discontinue the medication and contact your physician.  You should not drive, make important decisions, or operate machinery when taking narcotic pain medication.  SUNBLOCK Use sun block to incision area over the next year if this area will be exposed to sun. This helps decrease scarring and will allow you avoid a permanent darkened  area over your incision.  QUESTIONS:  Please feel free to call our office if you have any questions, and we will be glad to assist you.

## 2020-05-19 ENCOUNTER — Telehealth: Payer: Self-pay

## 2020-05-19 NOTE — Telephone Encounter (Signed)
Ventral hernia 04/14/20 DR.Cannon-she wanted to know if she could continue to take ibuprofen and I let her know she could purchase over the counter she stated she still had a little sensitivity to wearing pants around her waistline- and the ibuprofen helps.

## 2020-06-17 ENCOUNTER — Telehealth: Payer: Self-pay | Admitting: *Deleted

## 2020-06-17 ENCOUNTER — Other Ambulatory Visit: Payer: Self-pay | Admitting: Family Medicine

## 2020-06-17 NOTE — Telephone Encounter (Signed)
I did not deny.. I do not see request.  She is on phenergan [per med list not zofran. Which is she requesting?

## 2020-06-17 NOTE — Telephone Encounter (Signed)
Patient left a voicemail stating that she had requested a refill on her nausea medication and it was denied this morning. Patient stated that she does not understand that since the office was closed this morning. Patient stated that the medication states with an O (ondansetron). Patient stated that she only takes this medication when she is nauseated. Patient stated that she threw up some last night and this morning. Patient stated that Dr. Diona Browner has given this to her in the past. [facility].

## 2020-06-17 NOTE — Telephone Encounter (Signed)
Spoke with Ms. Tiffany Orozco.  She is requesting Zofran 8 mg tablet.  Not currently using phenergan.  [facility] [place]

## 2020-06-18 NOTE — Telephone Encounter (Signed)
See phone note in regards to refill on Zofran.

## 2020-06-23 NOTE — Telephone Encounter (Signed)
Script sent  

## 2020-06-24 ENCOUNTER — Telehealth: Payer: Self-pay | Admitting: Family Medicine

## 2020-06-24 NOTE — Telephone Encounter (Signed)
Pt called in wanted to know about being seen for throwing up and diarrhea for the past couple of months.. she had surgery its gotten worse. And she stated that after she was taken off her medication is when the diarrhea. And I did offer her 1/28 @9 

## 2020-06-24 NOTE — Telephone Encounter (Signed)
Called and spoke to patient and was advised that she has had this problem with vomiting and diarrheaoff and on since 2017. Patient stated that Dr. Diona Browner is aware of this and that is why she keeps nausea medication on hand. Patient stated that she had surgery back in November and it seems that the vomiting and diarrhea has gotten worse since her surgery. Patient stated that she threw up a couple of times yesterday and has had diarrhea about 8 times today. Patient stated that she has been dehydrated twice before recently and knows the signs to look for. Patient stated when she gets dehydrated she goes to the ER for IV fluids and she is not at that point now. Patient stated that feels that she is fine to wait and see Dr. Diona Browner Friday. Patient was given ER precautions and she verbalized understanding.

## 2020-06-24 NOTE — Telephone Encounter (Signed)
Agree with plan 

## 2020-06-27 ENCOUNTER — Encounter: Payer: Self-pay | Admitting: Family Medicine

## 2020-06-27 ENCOUNTER — Other Ambulatory Visit: Payer: Self-pay

## 2020-06-27 ENCOUNTER — Ambulatory Visit (INDEPENDENT_AMBULATORY_CARE_PROVIDER_SITE_OTHER): Payer: Medicare Other | Admitting: Family Medicine

## 2020-06-27 VITALS — BP 110/60 | HR 86 | Temp 97.5°F | Ht 59.0 in | Wt 166.0 lb

## 2020-06-27 DIAGNOSIS — R110 Nausea: Secondary | ICD-10-CM | POA: Diagnosis not present

## 2020-06-27 DIAGNOSIS — Z8719 Personal history of other diseases of the digestive system: Secondary | ICD-10-CM | POA: Diagnosis not present

## 2020-06-27 DIAGNOSIS — Z9889 Other specified postprocedural states: Secondary | ICD-10-CM | POA: Diagnosis not present

## 2020-06-27 DIAGNOSIS — R197 Diarrhea, unspecified: Secondary | ICD-10-CM | POA: Diagnosis not present

## 2020-06-27 LAB — CBC WITH DIFFERENTIAL/PLATELET
Basophils Absolute: 0.1 10*3/uL (ref 0.0–0.1)
Basophils Relative: 1 % (ref 0.0–3.0)
Eosinophils Absolute: 0.2 10*3/uL (ref 0.0–0.7)
Eosinophils Relative: 2.2 % (ref 0.0–5.0)
HCT: 41.5 % (ref 36.0–46.0)
Hemoglobin: 13.7 g/dL (ref 12.0–15.0)
Lymphocytes Relative: 31.2 % (ref 12.0–46.0)
Lymphs Abs: 2.4 10*3/uL (ref 0.7–4.0)
MCHC: 33 g/dL (ref 30.0–36.0)
MCV: 84.3 fl (ref 78.0–100.0)
Monocytes Absolute: 0.7 10*3/uL (ref 0.1–1.0)
Monocytes Relative: 9 % (ref 3.0–12.0)
Neutro Abs: 4.3 10*3/uL (ref 1.4–7.7)
Neutrophils Relative %: 56.6 % (ref 43.0–77.0)
Platelets: 396 10*3/uL (ref 150.0–400.0)
RBC: 4.93 Mil/uL (ref 3.87–5.11)
RDW: 17 % — ABNORMAL HIGH (ref 11.5–15.5)
WBC: 7.6 10*3/uL (ref 4.0–10.5)

## 2020-06-27 NOTE — Progress Notes (Signed)
Patient ID: Tiffany Orozco, female    DOB: 1951/01/08, 70 y.o.   MRN: [id number]  This visit was conducted in person.  BP 110/60   Pulse 86   Temp (!) 97.5 F (36.4 C) (Temporal)   Ht 4\' 11"  (1.499 m)   Wt 166 lb (75.3 kg)   SpO2 99%   BMI 33.53 kg/m    CC:  Chief Complaint  Patient presents with  . Nausea  . Diarrhea    Subjective:   HPI: Tiffany SharperSuenell K Selvage is a 70 y.o. female presenting on 06/27/2020 for Nausea and Diarrhea    70 year old female with history of chronic nausea, history of gastritis, PUD and burping.. with recent repair of ventral hernia by Dr. Lady Garyannon on 04/14/21 presents with  continue nausea and diarrhea.  Not really different.. bad in mornings.  Has been keeping diary.Marland Kitchen. every few days she skips BM then it is followed by diarrhea constantly throught the day, more frequent in last few months.. Associated with nausea.  No BM yesterday and today.   Belching improved when we  stopped her PPI Dexilant and omeprazole Dyphagia resolved   Breathing is better after hernia repair.   Cannot keep zofran or phenergan down some days.    Wt Readings from Last 3 Encounters:  06/27/20 166 lb (75.3 kg)  05/08/20 167 lb 9.6 oz (76 kg)  04/20/20 165 lb (74.8 kg)   Has seen Dr. Myrtie Neitheranis GI in past. She states she did have a " strong antibiotics or antiparasitic" in last year     2011 colonoscopy  2017 esophageal  Gastritis,and ulcer seen.  She typically is eating boiled chicken. Green beans, broccoli.  No change in diet lately.  Wt Readings from Last 3 Encounters:  06/27/20 166 lb (75.3 kg)  05/08/20 167 lb 9.6 oz (76 kg)  04/20/20 165 lb (74.8 kg)    Relevant past medical, surgical, family and social history reviewed and updated as indicated. Interim medical history since our last visit reviewed. Allergies and medications reviewed and updated. Outpatient Medications Prior to Visit  Medication Sig Dispense Refill  . albuterol (VENTOLIN HFA) 108 (90 Base)  MCG/ACT inhaler Inhale 2 puffs into the lungs every 6 (six) hours as needed for wheezing or shortness of breath. 18 g 11  . ARMOUR THYROID 90 MG tablet Take one tablet by mouth daily. 90 tablet 3  . azelastine (ASTELIN) 0.1 % nasal spray Place 1-2 sprays into both nostrils 2 (two) times daily as needed for allergies. Use in each nostril as directed    . Cholecalciferol (VITAMIN D3) 125 MCG (5000 UT) TABS Take 5,000 Units by mouth daily.    Marland Kitchen. dipyridamole-aspirin (AGGRENOX) 200-25 MG 12hr capsule Take 1 capsule by mouth 2 (two) times daily. 180 capsule 3  . fluticasone (FLONASE) 50 MCG/ACT nasal spray Place 1-2 sprays into both nostrils daily as needed for allergies or rhinitis.    Marland Kitchen. ibuprofen (ADVIL) 800 MG tablet Take 1 tablet (800 mg total) by mouth every 8 (eight) hours as needed. 30 tablet 0  . omeprazole (PRILOSEC) 20 MG capsule Take 20 mg by mouth daily as needed.    . ondansetron (ZOFRAN) 8 MG tablet TAKE 1 TABLET BY MOUTH EVERY 8 HOURS AS NEEDED FOR NAUSEA OR VOMITING. 20 tablet 0  . traMADol (ULTRAM) 50 MG tablet Take 1 tablet (50 mg total) by mouth every 6 (six) hours as needed. 20 tablet 0  . omeprazole (PRILOSEC) 20 MG capsule Take  1 capsule (20 mg total) by mouth daily. 90 capsule 0  . Coenzyme Q10 (COQ10 PO) Take 1 capsule by mouth daily. (Patient not taking: Reported on 06/27/2020)    . ezetimibe (ZETIA) 10 MG tablet Take 1 tablet (10 mg total) by mouth daily. (Patient not taking: Reported on 06/27/2020) 90 tablet 3   No facility-administered medications prior to visit.     Per HPI unless specifically indicated in ROS section below Review of Systems  Constitutional: Negative for fatigue and fever.  HENT: Negative for congestion.   Eyes: Negative for pain.  Respiratory: Negative for cough and shortness of breath.   Cardiovascular: Negative for chest pain, palpitations and leg swelling.  Gastrointestinal: Positive for nausea. Negative for abdominal pain.  Genitourinary: Negative  for dysuria and vaginal bleeding.  Musculoskeletal: Negative for back pain.  Neurological: Negative for syncope, light-headedness and headaches.  Psychiatric/Behavioral: Negative for dysphoric mood.   Objective:  BP 110/60   Pulse 86   Temp (!) 97.5 F (36.4 C) (Temporal)   Ht 4\' 11"  (1.499 m)   Wt 166 lb (75.3 kg)   SpO2 99%   BMI 33.53 kg/m   Wt Readings from Last 3 Encounters:  06/27/20 166 lb (75.3 kg)  05/08/20 167 lb 9.6 oz (76 kg)  04/20/20 165 lb (74.8 kg)      Physical Exam Constitutional:      General: She is not in acute distress.    Appearance: Normal appearance. She is well-developed. She is not ill-appearing or toxic-appearing.  HENT:     Head: Normocephalic.     Right Ear: Hearing, tympanic membrane, ear canal and external ear normal. Tympanic membrane is not erythematous, retracted or bulging.     Left Ear: Hearing, tympanic membrane, ear canal and external ear normal. Tympanic membrane is not erythematous, retracted or bulging.     Nose: No mucosal edema or rhinorrhea.     Right Sinus: No maxillary sinus tenderness or frontal sinus tenderness.     Left Sinus: No maxillary sinus tenderness or frontal sinus tenderness.     Mouth/Throat:     Pharynx: Uvula midline.  Eyes:     General: Lids are normal. Lids are everted, no foreign bodies appreciated.     Conjunctiva/sclera: Conjunctivae normal.     Pupils: Pupils are equal, round, and reactive to light.  Neck:     Thyroid: No thyroid mass or thyromegaly.     Vascular: No carotid bruit.     Trachea: Trachea normal.  Cardiovascular:     Rate and Rhythm: Normal rate and regular rhythm.     Pulses: Normal pulses.     Heart sounds: Normal heart sounds, S1 normal and S2 normal. No murmur heard. No friction rub. No gallop.   Pulmonary:     Effort: Pulmonary effort is normal. No tachypnea or respiratory distress.     Breath sounds: Normal breath sounds. No decreased breath sounds, wheezing, rhonchi or rales.   Abdominal:     General: Bowel sounds are normal.     Palpations: Abdomen is soft.     Tenderness: There is no abdominal tenderness.  Musculoskeletal:     Cervical back: Normal range of motion and neck supple.  Skin:    General: Skin is warm and dry.     Findings: No rash.  Neurological:     Mental Status: She is alert.  Psychiatric:        Mood and Affect: Mood is not anxious or depressed.  Speech: Speech normal.        Behavior: Behavior normal. Behavior is cooperative.        Thought Content: Thought content normal.        Judgment: Judgment normal.       Results for orders placed or performed during the hospital encounter of 04/20/20  Lipase, blood  Result Value Ref Range   Lipase 18 11 - 51 U/L  Comprehensive metabolic panel  Result Value Ref Range   Sodium 130 (L) 135 - 145 mmol/L   Potassium 3.6 3.5 - 5.1 mmol/L   Chloride 96 (L) 98 - 111 mmol/L   CO2 20 (L) 22 - 32 mmol/L   Glucose, Bld 107 (H) 70 - 99 mg/dL   BUN 9 8 - 23 mg/dL   Creatinine, Ser 0.55 0.44 - 1.00 mg/dL   Calcium 9.1 8.9 - 10.3 mg/dL   Total Protein 7.7 6.5 - 8.1 g/dL   Albumin 4.0 3.5 - 5.0 g/dL   AST 18 15 - 41 U/L   ALT 24 0 - 44 U/L   Alkaline Phosphatase 95 38 - 126 U/L   Total Bilirubin 0.6 0.3 - 1.2 mg/dL   GFR, Estimated >60 >60 mL/min   Anion gap 14 5 - 15  CBC  Result Value Ref Range   WBC 10.8 (H) 4.0 - 10.5 K/uL   RBC 5.12 (H) 3.87 - 5.11 MIL/uL   Hemoglobin 13.6 12.0 - 15.0 g/dL   HCT 41.3 36.0 - 46.0 %   MCV 80.7 80.0 - 100.0 fL   MCH 26.6 26.0 - 34.0 pg   MCHC 32.9 30.0 - 36.0 g/dL   RDW 15.9 (H) 11.5 - 15.5 %   Platelets 510 (H) 150 - 400 K/uL   nRBC 0.0 0.0 - 0.2 %  Troponin I (High Sensitivity)  Result Value Ref Range   Troponin I (High Sensitivity) 5 <18 ng/L    This visit occurred during the SARS-CoV-2 public health emergency.  Safety protocols were in place, including screening questions prior to the visit, additional usage of staff PPE, and extensive  cleaning of exam room while observing appropriate contact time as indicated for disinfecting solutions.   COVID 19 screen:  No recent travel or known exposure to COVID19 The patient denies respiratory symptoms of COVID 19 at this time. The importance of social distancing was discussed today.   Assessment and Plan Problem List Items Addressed This Visit    Chronic nausea - Primary     Can use zofran prn. Return to GI if npot improving.  Continue prilosec, avoid NSAIDs      Relevant Orders   Gastrointestinal Pathogen Panel PCR (Completed)   Diarrhea    No current evidence of dehydration. Push fluids. Eval for cause with labs.      Relevant Orders   CBC with Differential/Platelet (Completed)   Comprehensive metabolic panel   Gastrointestinal Pathogen Panel PCR (Completed)   Status post repair of ventral hernia       [name], MD

## 2020-06-27 NOTE — Patient Instructions (Signed)
Please stop at the lab to have labs drawn. We will start with labs and stool studies.  Can use nausea medication as needed.  Push fluids and keep up with electrolytes.

## 2020-07-01 ENCOUNTER — Other Ambulatory Visit: Payer: Medicare Other

## 2020-07-01 DIAGNOSIS — R197 Diarrhea, unspecified: Secondary | ICD-10-CM

## 2020-07-01 DIAGNOSIS — R110 Nausea: Secondary | ICD-10-CM | POA: Diagnosis not present

## 2020-07-04 LAB — GASTROINTESTINAL PATHOGEN PANEL PCR
C. difficile Tox A/B, PCR: NOT DETECTED
Campylobacter, PCR: NOT DETECTED
Cryptosporidium, PCR: NOT DETECTED
E coli (ETEC) LT/ST PCR: NOT DETECTED
E coli (STEC) stx1/stx2, PCR: NOT DETECTED
E coli 0157, PCR: NOT DETECTED
Giardia lamblia, PCR: NOT DETECTED
Norovirus, PCR: NOT DETECTED
Rotavirus A, PCR: NOT DETECTED
Salmonella, PCR: NOT DETECTED
Shigella, PCR: NOT DETECTED

## 2020-07-07 ENCOUNTER — Other Ambulatory Visit: Payer: Self-pay | Admitting: *Deleted

## 2020-07-08 MED ORDER — ONDANSETRON HCL 8 MG PO TABS
ORAL_TABLET | ORAL | 0 refills | Status: DC
Start: 2020-07-08 — End: 2021-01-12

## 2020-07-18 ENCOUNTER — Encounter: Payer: Self-pay | Admitting: Gastroenterology

## 2020-08-10 DIAGNOSIS — R197 Diarrhea, unspecified: Secondary | ICD-10-CM | POA: Insufficient documentation

## 2020-08-10 NOTE — Assessment & Plan Note (Signed)
Can use zofran prn. Return to GI if npot improving.  Continue prilosec, avoid NSAIDs

## 2020-08-10 NOTE — Assessment & Plan Note (Signed)
No current evidence of dehydration. Push fluids. Eval for cause with labs.

## 2020-10-15 ENCOUNTER — Telehealth: Payer: Self-pay | Admitting: Gastroenterology

## 2020-10-15 NOTE — Telephone Encounter (Signed)
These are chronic symptoms for the patient when she saw me once in July 2021 and with Dr. [name] prior to that. She asked the Rifixaimin question at the last clinic visit with me and I still do not recommend it.  Needs a clinic evaluation as scheduled next week.  No additional recommendations at this time.  - HD

## 2020-10-15 NOTE — Telephone Encounter (Signed)
Inbound call from patient requesting a call from a nurse please.  States she is has been having diarrhea and nausea and wants to know if something can be called in.  Please advise.

## 2020-10-15 NOTE — Telephone Encounter (Signed)
Spoke with patient, she reports diarrhea, nausea, and vomiting.  Patient states that she notices the nausea more in the mddle of the night after lying down, she will have dry heaves and vomit stomach bile. Patient states that she has lost a lot of crowns in her teeth due to the frequent vomiting. Patient states that when she gets nauseous she will take the Zofran but can't keep it down sometimes. She reports that she will have about 3 episodes of diarrhea a day. Patient requesting to try Rifaximin again to see if that will help with her symptoms. Advised that she may need to come in for re-assessment but will check with Dr. Loletha Carrow. Patient has been scheduled for a follow up with Tye Savoy, NP on Wednesday, 10/22/20 at 1:30 PM. Please advise on further recommendations, thanks.

## 2020-10-15 NOTE — Telephone Encounter (Signed)
Spoke with patient in regards to recommendations. Patient verbalized understanding and had no concerns at the end of the call.  

## 2020-10-22 ENCOUNTER — Ambulatory Visit (INDEPENDENT_AMBULATORY_CARE_PROVIDER_SITE_OTHER): Payer: Medicare Other | Admitting: Nurse Practitioner

## 2020-10-22 ENCOUNTER — Encounter: Payer: Self-pay | Admitting: Nurse Practitioner

## 2020-10-22 VITALS — BP 126/78 | HR 90 | Ht 60.0 in | Wt 164.0 lb

## 2020-10-22 DIAGNOSIS — K529 Noninfective gastroenteritis and colitis, unspecified: Secondary | ICD-10-CM | POA: Diagnosis not present

## 2020-10-22 DIAGNOSIS — K2100 Gastro-esophageal reflux disease with esophagitis, without bleeding: Secondary | ICD-10-CM

## 2020-10-22 DIAGNOSIS — R142 Eructation: Secondary | ICD-10-CM | POA: Diagnosis not present

## 2020-10-22 MED ORDER — NA SULFATE-K SULFATE-MG SULF 17.5-3.13-1.6 GM/177ML PO SOLN: 1.0000 | Freq: Once | ORAL | 0 refills | Status: AC

## 2020-10-22 NOTE — Progress Notes (Signed)
ASSESSMENT AND PLAN    # 70 yo female with chronic bloating / belching / nausea / vomiting. Belching previously felt by Dr. [name] to be supragastric in origin. She saw Dr. Loletha Carrow last year for a second opinion and he concurred.   --Discussed causes of supragastric belching / aerophagia.  Recommended she not use straws for drinking as it can contribute to aerophagia.    # Chronic GERD. Currently not on treatment. She has taken various PPIs and H2 blockers through the years but feel they exacerbate belching.  --Recommended she resume Dexilant which she has on hand at home. Explained that untreated GERD can result in esophagitis and possibly peptic strictures.    # IBS-D --Stool pathogen panel in February was negative.  --Trial of imodium OTC to  take at onset of diarrhea. --Patient is due for screening colonoscopy, consider random colon biopsies as time of colonoscopy. The risks and benefits of colonoscopy with possible polypectomy / biopsies were discussed and the patient agrees to proceed.    # History of CVA, on Aggrenox.  --Will talk with Dr. [name] to see if he would like her to hold Aggrenox prior to colonoscopy  HISTORY OF PRESENT ILLNESS:    Ms Olvera is a 70 yo female with past medical history significant for GERD, PUD, IBS, COPD, HTN, HLD, CVA, hypothyroidism, cholecystectomy, appendectomy,  She has been followed here for chronic upper and lower GI symptoms including  nausea, vomiting, belching, and diarrhea. She carries a diagnosis of GERD and IBS-D.She has had prior EGD, pH testing, manometry, colonoscopy, gastric emptying study, and barium swallow.  She has been on various PPIs and H2 blockers but feel that exacerbate the belching.   She describes nausea / vomiting unrelated to eating. In fact just smelling certain foods can lead to vomiting. She sleeps in a recliner but still cannot make it to the bathroom sometimes before vomiting.  Currently she isn't taking  anything for GERD.    Carmelina's  main complaint is that of constant belching which has previously been felt to be supragastric in nature. She was seen in our office July 2021 by Dr. Loletha Carrow for a second opinion regarding belching and vomiting and he concurred with Dr [name] that belching was supragastric and also that she seemed to be having episodes of regurgitation rather than vomiting.  She takes Zofran as needed prescribed by PCP  Patient seen in ED November 2021 with nausea, vomiting and diarrhea. She had undergone ventral hernia repair on 11/15. Labs reassuring. CT scan w/ contrast showed mild focal dilation of jejunum in the LUQ, possible a focal ileus. Brandey often wakes up around 3 am with diarrhea. She may have several episodes but then things settle down by noon.  She can't relate diarrhea to any thing she eats. She doesn't take any anti-motility agents but Ginger based drinks help. GI pathogen panel and CBC at PCP's office late January were ok. Her weight is stable.    PREVIOUS ENDOSCOPIC EVALUATIONS / PERTINENT STUDIES:   Oct 2017  EGD  -Esophagogastric landmarks identified. - LA Grade C reflux esophagitis. - Gastritis. Biopsied. - One non-bleeding duodenal ulcer with no stigmata of bleeding. - Erythematous duodenopathy. Biopsied. - Dilation performed in the entire esophagus for symptoms of dysphagia - Biopsies were taken with a cold forceps for histology in the upper third of Biopsies were taken with a cold forceps for histology in the upper third of the esophagus, inthe middle third of the esophagus and  in the lower third of the esophagus. Diagnosis 1. Surgical [P], duodenal bulb and 2nd portion of duodenun - BENIGN DUODENAL MUCOSA WITH VILLOUS BLUNTING ASSOCIATED WITH REACTIVE EPITHELIAL CHANGES, SEE COMMENT. 2. Surgical [P], gastric antrum and gastric body - BENIGN GASTRIC MUCOSA. NO HELICOBACTER PYLORI, INTESTINAL METAPLASIA OR ACTIVE INFLAMMATION IDENTIFIED. 3. Surgical  [P], esophageal, bipsy - BENIGN SQUAMOUS MUCOSA WITH REACTIVE CHANGES AND FEATURES SUGGESTIVE OF GASTROESOPHAGEAL REFLUX. - NO INTESTINAL METAPLASIA, DYSPLASIA OR MALIGNANCY IDENTIFIED   EGD 2016 -Esophagogastric landmarks identified. - LA Grade C reflux esophagitis. - Gastritis. Biopsied. - One non-bleeding duodenal ulcer with no stigmata of bleeding. - Erythematous duodenopathy. Biopsied. - Dilation performed in the entire esophagus for symptoms of dysphagia  Surgical [P], antrum, biopsy - BENIGN GASTRIC MUCOSA. NO HELICOBACTER PYLORI, INTESTINAL METAPLASIA OR ACTIVE INFLAMMATION IDENTIFIED. 2. Surgical [P], GE junction, biopsy - FINDINGS CONSISTENT WITH GASTROESOPHAGEAL REFLUX. - NO INTESTINAL METAPLASIA, DYSPLASIA OR MALIGNANCY IDENTIFIED.     Past Medical History:  Diagnosis Date  . Allergy   . Anxiety   . COPD (chronic obstructive pulmonary disease) ([place])   . DDD (degenerative disc disease), lumbar    mild  . Depression   . Female rectocele without uterine prolapse   . GERD (gastroesophageal reflux disease)   . Hearing loss   . Heart murmur   . Hyperlipidemia   . Hypertension   . Hypothyroidism   . IBS (irritable bowel syndrome)   . Influenza A 06/2015  . Obesity   . Osteopenia   . Plantar fasciitis   . Pneumonia   . Skin cancer of arm   . Skin cancer of face   . Stroke ([place]) 07/2010   no permenant deficits  . Thrombocytopenia ([place])   . Tubal pregnancy     Current Medications, Allergies, Past Surgical History, Family History and Social History were reviewed in [employer] record.   Current Outpatient Medications  Medication Sig Dispense Refill  . ARMOUR THYROID 90 MG tablet Take one tablet by mouth daily. 90 tablet 3  . Cholecalciferol (VITAMIN D3) 125 MCG (5000 UT) TABS Take 5,000 Units by mouth daily.    Marland Kitchen dipyridamole-aspirin (AGGRENOX) 200-25 MG 12hr capsule Take 1 capsule by mouth 2 (two) times daily. 180 capsule 3  .  fluticasone (FLONASE) 50 MCG/ACT nasal spray Place 1-2 sprays into both nostrils daily as needed for allergies or rhinitis.    Marland Kitchen ibuprofen (ADVIL) 800 MG tablet Take 1 tablet (800 mg total) by mouth every 8 (eight) hours as needed. 30 tablet 0  . ondansetron (ZOFRAN) 8 MG tablet TAKE 1 TABLET BY MOUTH EVERY 8 HOURS AS NEEDED FOR NAUSEA OR VOMITING. 90 tablet 0   No current facility-administered medications for this visit.    Review of Systems: No chest pain. No shortness of breath. No urinary complaints.   PHYSICAL EXAM :    Wt Readings from Last 3 Encounters:  10/22/20 164 lb (74.4 kg)  06/27/20 166 lb (75.3 kg)  05/08/20 167 lb 9.6 oz (76 kg)    BP 126/78   Pulse 90   Ht 5' (1.524 m)   Wt 164 lb (74.4 kg)   BMI 32.03 kg/m  Constitutional:  Pleasant female in no acute distress. Psychiatric: Normal mood and affect. Behavior is normal. EENT: Pupils normal.  Conjunctivae are normal. No scleral icterus. Neck supple.  Cardiovascular: Normal rate, regular rhythm. No edema Pulmonary/chest: Effort normal and breath sounds normal. No wheezing, rales or rhonchi. Abdominal: Soft, nondistended, nontender. Bowel sounds  active throughout. There are no masses palpable. No hepatomegaly. Neurological: Alert and oriented to person place and time. Skin: Skin is warm and dry. No rashes noted.  Tye Savoy, NP  10/22/2020, 1:41 PM

## 2020-10-22 NOTE — Patient Instructions (Signed)
If you are age 72 or older, your body mass index should be between 23-30. Your Body mass index is 32.03 kg/m. If this is out of the aforementioned range listed, please consider follow up with your Primary Care Provider.  If you are age 72 or younger, your body mass index should be between 19-25. Your Body mass index is 32.03 kg/m. If this is out of the aformentioned range listed, please consider follow up with your Primary Care Provider.   You have been scheduled for a colonoscopy. Please follow written instructions given to you at your visit today.  Please pick up your prep supplies at the pharmacy within the next 1-3 days. If you use inhalers (even only as needed), please bring them with you on the day of your procedure.  Please do not use a straw when drinking liquids.  You may use Imodium as needed for diarrhea.   Resume Dexilant daily.   Due to recent changes in healthcare laws, you may see the results of your imaging and laboratory studies on MyChart before your provider has had a chance to review them.  We understand that in some cases there may be results that are confusing or concerning to you. Not all laboratory results come back in the same time frame and the provider may be waiting for multiple results in order to interpret others.  Please give [place] 48 hours in order for your provider to thoroughly review all the results before contacting the office for clarification of your results.   The [facility] providers would like to encourage you to use [facility] to communicate with providers for non-urgent requests or questions.  Due to long hold times on the telephone, sending your provider a message by [facility] may be a faster and more efficient way to get a response.  Please allow 48 business hours for a response.  Please remember that this is for non-urgent requests.   Thank you for choosing me and [facility].  Tye Savoy, NP

## 2020-10-27 ENCOUNTER — Encounter: Payer: Self-pay | Admitting: Nurse Practitioner

## 2020-10-28 ENCOUNTER — Telehealth: Payer: Self-pay

## 2020-10-28 ENCOUNTER — Other Ambulatory Visit: Payer: Self-pay

## 2020-10-28 NOTE — Telephone Encounter (Signed)
-----   Message from Willia Craze, NP sent at 10/28/2020  8:37 AM EDT ----- Jamas Lav,  Please call patient and let her know Dr. [name] DOES want to hold Aggrenox for colonoscopy. Can you hold it for three days? I think Dr. Josefa Half would be the one to give clearance. FYI, I am at [facility] today and Tuesday. Thanks Pg

## 2020-10-28 NOTE — Progress Notes (Signed)
Agree with assessment and plan as outlined. Aggrenox is aspirin + dipyrimidole and per ASGE guidelines should be held for 3 days prior to elective endoscopic procedure, if you can let her know. Thanks

## 2020-10-28 NOTE — Telephone Encounter (Signed)
Okay to hold aggrenox as requested for 3 days prior to procedure

## 2020-10-28 NOTE — Telephone Encounter (Signed)
Pre-operative Risk Assessment     Request for surgical clearance:     Endoscopy Procedure  What type of surgery is being performed?     Colonoscopy  When is this surgery scheduled?     01/27/2021  What type of clearance is required ?   Pharmacy  Are there any medications that need to be held prior to surgery and how long? Aggrenox to be held for 3 days prior to the procedure  Practice name and name of physician performing surgery?      [facility] [place] Cellar, MD  What is your office phone and fax number?      Phone- [phone]  [phone]  Anesthesia type (None, local, MAC, general) ?       MAC

## 2020-10-28 NOTE — Telephone Encounter (Signed)
Called the patient to make her aware of this. No answer. Left her a message with this information and to expect a call with instructions closer to the date of the colonoscopy.

## 2020-10-28 NOTE — Telephone Encounter (Signed)
Written instructions provided to the patient.

## 2020-11-26 ENCOUNTER — Telehealth: Payer: Self-pay | Admitting: *Deleted

## 2020-11-26 ENCOUNTER — Encounter: Payer: Self-pay | Admitting: Emergency Medicine

## 2020-11-26 ENCOUNTER — Other Ambulatory Visit: Payer: Self-pay

## 2020-11-26 ENCOUNTER — Ambulatory Visit
Admission: EM | Admit: 2020-11-26 | Discharge: 2020-11-26 | Disposition: A | Discharge status: Home / Self Care | Payer: Medicare Other | Attending: Emergency Medicine | Admitting: Emergency Medicine

## 2020-11-26 DIAGNOSIS — K0510 Chronic gingivitis, plaque induced: Secondary | ICD-10-CM | POA: Diagnosis not present

## 2020-11-26 DIAGNOSIS — K029 Dental caries, unspecified: Secondary | ICD-10-CM

## 2020-11-26 MED ORDER — DOXYCYCLINE HYCLATE 100 MG PO CAPS: 100.0000 mg | ORAL_CAPSULE | Freq: Two times a day (BID) | ORAL | 0 refills | Status: DC

## 2020-11-26 NOTE — Telephone Encounter (Signed)
Spoke to patient by telephone and was advised that she has had vomiting and diarrhea off and on for years. Patient stated that she knows when she gets dehydrated and when that happens she goes to the ER to get IV fluids. Patient stated that she knows that she is not dehydrated at this time. Patient stated the reason she called was because of her face being swollen and her gums are red. Patient stated that she may have an abscessed tooth. Patient stated that she can not get in with her dentist until next month. Patient stated that she wants to go ahead and go to an Urgent Care today. Patient was given information on the [facility]. Patient stated that she is going to head over there shortly.

## 2020-11-26 NOTE — ED Triage Notes (Signed)
Pt presents today with c/o of dental pain x 2 days. She was unable to get in to see PCP an has upcoming dental appt in one month.

## 2020-11-26 NOTE — Telephone Encounter (Signed)
PLEASE NOTE: All timestamps contained within this report are represented as [place] Standard Time. CONFIDENTIALTY NOTICE: This fax transmission is intended only for the addressee. It contains information that is legally privileged, confidential or otherwise protected from use or disclosure. If you are not the intended recipient, you are strictly prohibited from reviewing, disclosing, copying using or disseminating any of this information or taking any action in reliance on or regarding this information. If you have received this fax in error, please notify us immediately by telephone so that we can arrange for its return to [place]. Phone: [phone], Toll-Free: [phone], Fax: [phone] Page: 1 of 3 Call Id: [id number] [place] [facility] - Client TELEPHONE ADVICE RECORD AccessNurse Patient Name: Tiffany Orozco Gender: Female DOB: 1951-03-18 Age: 70 Y 73 M 16 D Return Phone Number: [phone] (Primary) Address[place]  [address] Client [facility] - Client Client Site [place] - Day Physician [name] - MD Contact Type Call Who Is Calling Patient / Member / Family / Caregiver Call Type Triage / Clinical Relationship To Patient Self Return Phone Number [phone] (Primary) Chief Complaint Vomiting Reason for Call Symptomatic / Request for [place] states she is vomiting, diarrhea, and she thinks something is infected in her mouth. The office has no appointments today or tomorrow. She has urinated in the past 8 hours. Translation No Nurse Assessment Nurse: Rolin Barry, RN, Levada Dy Date/Time Eilene Ghazi Time): 11/26/2020 10:24:15 AM Confirm and document reason for call. If symptomatic, describe symptoms. ---Caller states she is vomiting, diarrhea, and she thinks something is infected in her mouth. The office has no appointments today or tomorrow.  She has urinated in the past 8 hours. No temp. Gum pain and mouth pain. Having nausea x 3 years. Having tooth problems from the stomach acid. Sees a GI doctor. Last vomited in may 30th. Diarrhea in may x 9 times. Does the patient have any new or worsening symptoms? ---Yes Will a triage be completed? ---Yes Related visit to physician within the last 2 weeks? ---No Does the PT have any chronic conditions? (i.e. diabetes, asthma, this includes High risk factors for pregnancy, etc.) ---Yes List chronic conditions. ---Thyroid had a stroke, Aggrenox hernia sx , 5 months ago Is this a behavioral health or substance abuse call? ---No Guidelines Guideline Title Affirmed Question Affirmed Notes Nurse Date/Time (Eastern Time) Nausea Nausea is a chronic symptom (recurrent or ongoing AND present > 4 weeks) Deaton, RN, Levada Dy 11/26/2020 10:29:45 AM PLEASE NOTE: All timestamps contained within this report are represented as [place] Standard Time. CONFIDENTIALTY NOTICE: This fax transmission is intended only for the addressee. It contains information that is legally privileged, confidential or otherwise protected from use or disclosure. If you are not the intended recipient, you are strictly prohibited from reviewing, disclosing, copying using or disseminating any of this information or taking any action in reliance on or regarding this information. If you have received this fax in error, please notify us immediately by telephone so that we can arrange for its return to [place]. Phone: [phone], Toll-Free: [phone], Fax: [phone] Page: 2 of 3 Call Id: [id number] Guidelines Guideline Title Affirmed Question Affirmed Notes Nurse Date/Time Eilene Ghazi Time) Mouth Pain Face is very swollen Deaton, RN, Levada Dy 11/26/2020 10:33:29 AM Disp. Time Eilene Ghazi Time) Disposition Final User 11/26/2020 10:33:09 AM See PCP within 2 Odette Fraction, RN, Levada Dy 11/26/2020 10:37:35 AM See HCP within 4 Hours (or PCP  triage)  Yes Deaton, RN, Cindee Lame Disagree/Comply Comply Caller Understands Yes PreDisposition Did not know what to do Care Advice Given Per Guideline SEE PCP WITHIN 2 WEEKS: * You need to be seen for this ongoing problem within the next 2 weeks. CLEAR FLUIDS: * Take clear fluids in small amounts until the nausea is resolved for 8 hours: AVOID MEDS: * Stop taking all non-prescription medicines. Reason: May make nausea worse. * Avoid NSAIDs, which can cause gastritis CALL BACK IF: * You become worse CARE ADVICE given per Nausea (Adult) guideline. SEE HCP (OR PCP TRIAGE) WITHIN 4 HOURS: * IF OFFICE WILL BE OPEN: You need to be seen within the next 3 or 4 hours. Call your doctor (or NP/PA) now or as soon as the office opens. * UCC: Some UCCs can manage patients who are stable and have less serious symptoms (e.g., minor illnesses and injuries). The triager must know the [facility] capabilities before sending a patient there. If unsure, call ahead. * ACETAMINOPHEN - REGULAR STRENGTH TYLENOL: Take 650 mg (two 325 mg pills) by mouth every 4 to 6 hours as needed. Each Regular Strength Tylenol pill has 325 mg of acetaminophen. The most you should take each day is 3,250 mg (10 pills a day). CALL BACK IF: * Difficulty with swallowing occurs (e.g., can't swallow or drooling) * Difficulty with breathing occurs * You become worse CARE ADVICE given per Mouth Pain (Adult) guideline. Comments User: Saverio Danker, RN Date/Time Eilene Ghazi Time): 11/26/2020 10:37:30 AM No answer at the backline. User: Saverio Danker, RN Date/Time Eilene Ghazi Time): 11/26/2020 10:37:58 AM Has nausea medication at this time. User: Saverio Danker, RN Date/Time Eilene Ghazi Time): 11/26/2020 10:43:02 AM No appointment available at the office. Vanessa at the office confirmed. Caller was given phone number to the [facility] per request. PLEASE NOTE: All timestamps contained within this report are represented as [place] Standard  Time. CONFIDENTIALTY NOTICE: This fax transmission is intended only for the addressee. It contains information that is legally privileged, confidential or otherwise protected from use or disclosure. If you are not the intended recipient, you are strictly prohibited from reviewing, disclosing, copying using or disseminating any of this information or taking any action in reliance on or regarding this information. If you have received this fax in error, please notify us immediately by telephone so that we can arrange for its return to [place]. Phone: [phone], Toll-Free: [phone], Fax: [phone] Page: 3 of 3 Call Id: [id number] Referrals GO TO FACILITY UNDECIDED

## 2020-11-26 NOTE — ED Provider Notes (Signed)
[facility]    CSN: [id number] Arrival date & time: 11/26/20  1205      History   Chief Complaint Chief Complaint  Patient presents with   Dental Pain     HPI KARLEIGH BUNTE is a 70 y.o. female.  Patient presents with 2-day history of right upper tooth ache and gum pain.  She states she is unable to get an appointment with her dentist until next month.  She denies fever, chills, difficulty swallowing, or other symptoms.  Treated with OTC pain reliever at home.  Her medical history includes hypertension, COPD, stroke, GERD, IBS, chronic back pain.  The history is provided by the patient and medical records.   Past Medical History:  Diagnosis Date   Allergy    Anxiety    COPD (chronic obstructive pulmonary disease) ([place])    DDD (degenerative disc disease), lumbar    mild   Depression    Female rectocele without uterine prolapse    GERD (gastroesophageal reflux disease)    Hearing loss    Heart murmur    Hyperlipidemia    Hypertension    Hypothyroidism    IBS (irritable bowel syndrome)    Influenza A 06/2015   Obesity    Osteopenia    Plantar fasciitis    Pneumonia    Skin cancer of arm    Skin cancer of face    Stroke ([place]) 07/2010   no permenant deficits   Thrombocytopenia ([place])    Tubal pregnancy     Patient Active Problem List   Diagnosis Date Noted   Diarrhea 08/10/2020   Status post repair of ventral hernia 69/62/9528   Umbilical hernia without obstruction and without gangrene    Acoustic trauma (explosive) to ear, bilateral 04/03/2019   Esophagitis 09/02/2016   PUD (peptic ulcer disease) 09/02/2016   Coronary atherosclerosis of native coronary artery 05/04/2016   Tobacco abuse 06/09/2015   History of CVA (cerebrovascular accident) 06/08/2015   IBS (irritable bowel syndrome) 06/08/2015   Chronic nausea 01/17/2015   Chronic back pain 07/12/2014   Dysphagia 04/19/2014   Fatigue 09/12/2012   HTN (hypertension) 11/30/2011   Herniation of  rectum into vagina 08/29/2011   Hyponatremia 03/26/2011   Dyspnea 03/23/2011   CONSTIPATION, CHRONIC 03/19/2010   Osteopenia 03/19/2010   Eustachian tube dysfunction 10/21/2009   MURMUR 03/20/2009   UNSPECIFIED CARDIAC DYSRHYTHMIA 03/06/2009   Allergic rhinitis 08/14/2007   Hypothyroidism 02/27/2007   HYPERCHOLESTEROLEMIA 02/27/2007   Generalized anxiety disorder 02/27/2007   Depression, major, single episode, moderate ([place]) 02/27/2007   GERD 02/27/2007    Past Surgical History:  Procedure Laterality Date   APPENDECTOMY     CHOLECYSTECTOMY  2004   COLONOSCOPY     CYST EXCISION Right    breast mole   EMGs  7/04   atms and wrists neg   ESOPHAGEAL MANOMETRY N/A 03/17/2015   Procedure: ESOPHAGEAL MANOMETRY (EM);  Surgeon: Manus Gunning, MD;  Location: [facility];  Service: Gastroenterology;  Laterality: N/A;   FOOT SURGERY Right 2008, 2009, 2010   torn ligament, hammertoes   TUBAL LIGATION  1975   UPPER GI ENDOSCOPY     XI ROBOTIC ASSISTED VENTRAL HERNIA N/A 04/14/2020   Procedure: XI ROBOTIC ASSISTED VENTRAL HERNIA;  Surgeon: Fredirick Maudlin, MD;  Location: [facility];  Service: General;  Laterality: N/A;    OB History     Gravida  5   Para  2   Term  2   Preterm  0   AB  3   Living  2      SAB  2   IAB  0   Ectopic  1   Multiple  0   Live Births  2            Home Medications    Prior to Admission medications   Medication Sig Start Date End Date Taking? Authorizing Provider  doxycycline (VIBRAMYCIN) 100 MG capsule Take 1 capsule (100 mg total) by mouth 2 (two) times daily. 11/26/20  Yes Sharion Balloon, NP  ARMOUR THYROID 90 MG tablet Take one tablet by mouth daily. 02/29/20   Bedsole, Amy E, MD  Cholecalciferol (VITAMIN D3) 125 MCG (5000 UT) TABS Take 5,000 Units by mouth daily.    [provider]  dipyridamole-aspirin (AGGRENOX) 200-25 MG 12hr capsule Take 1 capsule by mouth 2 (two) times daily. 02/29/20   Bedsole, Amy E, MD   fluticasone (FLONASE) 50 MCG/ACT nasal spray Place 1-2 sprays into both nostrils daily as needed for allergies or rhinitis.    [provider]  ibuprofen (ADVIL) 800 MG tablet Take 1 tablet (800 mg total) by mouth every 8 (eight) hours as needed. 04/14/20   Fredirick Maudlin, MD  ondansetron (ZOFRAN) 8 MG tablet TAKE 1 TABLET BY MOUTH EVERY 8 HOURS AS NEEDED FOR NAUSEA OR VOMITING. 07/08/20   Jinny Sanders, MD    Family History Family History  Problem Relation Age of Onset   Skin cancer Father    Hypertension Mother    Lung cancer Brother    Colon cancer Sister    Esophageal cancer Sister    Ovarian cancer Sister    Stomach cancer Neg Hx    Pancreatic cancer Neg Hx     Social History Social History   Tobacco Use   Smoking status: Every Day    Packs/day: 0.75    Years: 31.00    Pack years: 23.25    Types: Cigarettes   Smokeless tobacco: Never   Tobacco comments:    States she is not smoking during current illness 05/29/18 sb  Vaping Use   Vaping Use: Never used  Substance Use Topics   Alcohol use: No    Alcohol/week: 0.0 standard drinks   Drug use: No     Allergies   Lisinopril, Niacin, Welchol [colesevelam hcl], Augmentin [amoxicillin-pot clavulanate], Codeine, and Oxycodone hcl   Review of Systems Review of Systems  Constitutional:  Negative for chills and fever.  HENT:  Positive for dental problem. Negative for ear pain, sore throat, trouble swallowing and voice change.   Respiratory:  Negative for cough and shortness of breath.   Cardiovascular:  Negative for chest pain and palpitations.  Skin:  Negative for color change and rash.  All other systems reviewed and are negative.   Physical Exam Triage Vital Signs ED Triage Vitals  Enc Vitals Group     BP      Pulse      Resp      Temp      Temp src      SpO2      Weight      Height      Head Circumference      Peak Flow      Pain Score      Pain Loc      Pain Edu?      Excl. in [place]?     No data found.  Updated Vital Signs BP Marland Kitchen)  153/78 (BP Location: Left Arm)   Pulse 83   Temp 98.5 F (36.9 C) (Oral)   Resp 16   SpO2 95%   Visual Acuity Right Eye Distance:   Left Eye Distance:   Bilateral Distance:    Right Eye Near:   Left Eye Near:    Bilateral Near:     Physical Exam Vitals and nursing note reviewed.  Constitutional:      General: She is not in acute distress.    Appearance: She is well-developed.  HENT:     Head: Normocephalic and atraumatic.     Mouth/Throat:     Mouth: Mucous membranes are moist.     Dentition: Abnormal dentition. Dental tenderness, gingival swelling and dental caries present.     Pharynx: Oropharynx is clear.   Eyes:     Conjunctiva/sclera: Conjunctivae normal.  Cardiovascular:     Rate and Rhythm: Normal rate and regular rhythm.     Heart sounds: Normal heart sounds.  Pulmonary:     Effort: Pulmonary effort is normal. No respiratory distress.     Breath sounds: Normal breath sounds.  Abdominal:     Palpations: Abdomen is soft.     Tenderness: There is no abdominal tenderness.  Musculoskeletal:     Cervical back: Neck supple.  Skin:    General: Skin is warm and dry.  Neurological:     General: No focal deficit present.     Mental Status: She is alert and oriented to person, place, and time.     Gait: Gait normal.  Psychiatric:        Mood and Affect: Mood normal.        Behavior: Behavior normal.     UC Treatments / Results  Labs (all labs ordered are listed, but only abnormal results are displayed) Labs Reviewed - No data to display  EKG   Radiology No results found.  Procedures Procedures (including critical care time)  Medications Ordered in UC Medications - No data to display  Initial Impression / Assessment and Plan / UC Course  I have reviewed the triage vital signs and the nursing notes.  Pertinent labs & imaging results that were available during my care of the patient were reviewed by me  and considered in my medical decision making (see chart for details).  Dental pain due to dental caries, gingivitis.  Treating with doxycycline.  (Patient has GI upset with Augmentin.  Not treating with clindamycin due to patient's chronic diarrhea due to IBS.)  Dental resource guide provided and instructed patient to follow-up with a dentist as soon as possible.  ED precautions discussed.  Patient agrees to plan of care.   Final Clinical Impressions(s) / UC Diagnoses   Final diagnoses:  Pain due to dental caries  Gingivitis     Discharge Instructions      Take the antibiotic as prescribed.    A dental resource guide is attached.  Please call to make an appointment with a dentist as soon as possible.    Go to the emergency department if you have acute worsening symptoms.         ED Prescriptions     Medication Sig Dispense Auth. Provider   doxycycline (VIBRAMYCIN) 100 MG capsule Take 1 capsule (100 mg total) by mouth 2 (two) times daily. 20 capsule Sharion Balloon, NP      I have reviewed the PDMP during this encounter.   Sharion Balloon, NP 11/26/20 1247

## 2020-11-26 NOTE — Discharge Instructions (Addendum)
Take the antibiotic as prescribed.    A dental resource guide is attached.  Please call to make an appointment with a dentist as soon as possible.    Go to the emergency department if you have acute worsening symptoms.     

## 2020-11-26 NOTE — Telephone Encounter (Signed)
Agree with plan 

## 2020-11-27 ENCOUNTER — Encounter: Payer: Self-pay | Admitting: Primary Care

## 2020-11-27 ENCOUNTER — Ambulatory Visit (INDEPENDENT_AMBULATORY_CARE_PROVIDER_SITE_OTHER): Payer: Medicare Other | Admitting: Primary Care

## 2020-11-27 VITALS — BP 160/80 | HR 100 | Temp 98.3°F | Ht 60.0 in | Wt 160.0 lb

## 2020-11-27 DIAGNOSIS — R220 Localized swelling, mass and lump, head: Secondary | ICD-10-CM | POA: Diagnosis not present

## 2020-11-27 DIAGNOSIS — K029 Dental caries, unspecified: Secondary | ICD-10-CM

## 2020-11-27 MED ORDER — AMOXICILLIN-POT CLAVULANATE 875-125 MG PO TABS: 1.0000 | ORAL_TABLET | Freq: Two times a day (BID) | ORAL | 0 refills | Status: DC

## 2020-11-27 MED ORDER — CEFTRIAXONE SODIUM 1 G IJ SOLR
1.0000 g | Freq: Once | INTRAMUSCULAR | Status: AC
  Administered 2020-11-27: 15:00:00 1 g via INTRAMUSCULAR

## 2020-11-27 NOTE — Progress Notes (Signed)
Subjective:    Patient ID: Tiffany Orozco, female    DOB: [birth year], 70 y.o.   MRN: [id number]  HPI  Tiffany Orozco is a very pleasant 70 y.o. female patient of Dr. Diona Browner with a history of hypertension, hypothyroidism, hyperlipidemia, cardiac dysrhythmia, CAD, chronic nausea, chronic diarrhea, GERD who presents today to discuss facial swelling.  She presented to urgent care in [facility] yesterday with a 2-day history of right upper dental and gingival pain. She has a significant history of dental caries and dental problems.  She was unable to get in with her dentist.  Her exam was consistent for dental caries and gingivitis so she was treated with doxycycline antibiotics.  Augmentin was not initiated due to patient's history of GI upset, and clindamycin was not prescribed due to chronic diarrhea from IBS.  She was advised to follow-up with a dentist as soon as possible.  Today she continues to notice facial pain to the right side of her face, intially began to the upper gums. Her facial pain and swelling has doubled within the last 24 hours. She's taken three doses of he Doxycycline. She denies an actual allergy to Augmentin, just nausea only. She has chronic nausea. She denies airway compromise, wheezing, trouble swallowing.    Review of Systems  Constitutional:  Negative for fever.  HENT:  Positive for dental problem and facial swelling. Negative for trouble swallowing.   Respiratory:  Negative for shortness of breath and wheezing.         Past Medical History:  Diagnosis Date   Allergy    Anxiety    COPD (chronic obstructive pulmonary disease) (HCC)    DDD (degenerative disc disease), lumbar    mild   Depression    Female rectocele without uterine prolapse    GERD (gastroesophageal reflux disease)    Hearing loss    Heart murmur    Hyperlipidemia    Hypertension    Hypothyroidism    IBS (irritable bowel syndrome)    Influenza A 06/2015   Obesity    Osteopenia     Plantar fasciitis    Pneumonia    Skin cancer of arm    Skin cancer of face    Stroke ([place]) 07/2010   no permenant deficits   Thrombocytopenia (HCC)    Tubal pregnancy     Social History   Socioeconomic History   Marital status: Legally Separated    Spouse name: Rosina Cressler   Number of children: 2   Years of education: Not on file   Highest education level: Not on file  Occupational History   Occupation: bookkeeper    Fish farm manager: UNEMPLOYED    Comment: 10/10 not working x 9 years  Tobacco Use   Smoking status: Every Day    Packs/day: 0.75    Years: 31.00    Pack years: 23.25    Types: Cigarettes   Smokeless tobacco: Never   Tobacco comments:    States she is not smoking during current illness 05/29/18 sb  Vaping Use   Vaping Use: Never used  Substance and Sexual Activity   Alcohol use: No    Alcohol/week: 0.0 standard drinks   Drug use: No   Sexual activity: Yes    Birth control/protection: Post-menopausal  Other Topics Concern   Not on file  Social History Narrative   15/10 husband had open heart surgery, not working, 2 grandchildren, siblings living and healthy   Social Determinants of Health   Financial Resource Strain:  Not on file  Food Insecurity: Not on file  Transportation Needs: Not on file  Physical Activity: Not on file  Stress: Not on file  Social Connections: Not on file  Intimate Partner Violence: Not on file    Past Surgical History:  Procedure Laterality Date   APPENDECTOMY     CHOLECYSTECTOMY  2004   COLONOSCOPY     CYST EXCISION Right    breast mole   EMGs  7/04   atms and wrists neg   ESOPHAGEAL MANOMETRY N/A 03/17/2015   Procedure: ESOPHAGEAL MANOMETRY (EM);  Surgeon: Manus Gunning, MD;  Location: [facility];  Service: Gastroenterology;  Laterality: N/A;   FOOT SURGERY Right 2008, 2009, 2010   torn ligament, hammertoes   TUBAL LIGATION  1975   UPPER GI ENDOSCOPY     XI ROBOTIC ASSISTED VENTRAL HERNIA N/A 04/14/2020    Procedure: XI ROBOTIC ASSISTED VENTRAL HERNIA;  Surgeon: Fredirick Maudlin, MD;  Location: [facility] ORS;  Service: General;  Laterality: N/A;    Family History  Problem Relation Age of Onset   Skin cancer Father    Hypertension Mother    Lung cancer Brother    Colon cancer Sister    Esophageal cancer Sister    Ovarian cancer Sister    Stomach cancer Neg Hx    Pancreatic cancer Neg Hx     Allergies  Allergen Reactions   Lisinopril Shortness Of Breath   Niacin Other (See Comments)     dizziness   Welchol [Colesevelam Hcl] Diarrhea, Nausea Only, Swelling and Other (See Comments)     swelling in face   Augmentin [Amoxicillin-Pot Clavulanate] Nausea Only    GI upset with taking.   Codeine     keeps patient awake   Oxycodone Hcl     Patient states this medication makes her hyper    Current Outpatient Medications on File Prior to Visit  Medication Sig Dispense Refill   ARMOUR THYROID 90 MG tablet Take one tablet by mouth daily. 90 tablet 3   Cholecalciferol (VITAMIN D3) 125 MCG (5000 UT) TABS Take 5,000 Units by mouth daily.     dipyridamole-aspirin (AGGRENOX) 200-25 MG 12hr capsule Take 1 capsule by mouth 2 (two) times daily. 180 capsule 3   fluticasone (FLONASE) 50 MCG/ACT nasal spray Place 1-2 sprays into both nostrils daily as needed for allergies or rhinitis.     ibuprofen (ADVIL) 800 MG tablet Take 1 tablet (800 mg total) by mouth every 8 (eight) hours as needed. 30 tablet 0   ondansetron (ZOFRAN) 8 MG tablet TAKE 1 TABLET BY MOUTH EVERY 8 HOURS AS NEEDED FOR NAUSEA OR VOMITING. 90 tablet 0   No current facility-administered medications on file prior to visit.    BP (!) 160/80   Pulse 100   Temp 98.3 F (36.8 C) (Temporal)   Ht 5' (1.524 m)   Wt 160 lb (72.6 kg)   SpO2 96%   BMI 31.25 kg/m  Objective:   Physical Exam HENT:     Mouth/Throat:     Dentition: Abnormal dentition. Dental tenderness, gingival swelling and dental caries present.     Comments: Moderate right  sided facial swelling.  Mild erythema.  Neurological:     Mental Status: She is alert.          Assessment & Plan:      This visit occurred during the SARS-CoV-2 public health emergency.  Safety protocols were in place, including screening questions prior to the visit, additional usage  of staff PPE, and extensive cleaning of exam room while observing appropriate contact time as indicated for disinfecting solutions.

## 2020-11-27 NOTE — Patient Instructions (Signed)
Start Augmentin antibiotics for the infection Take 1 tablet by mouth twice daily for 10 days.  Stop taking Doxycycline antibiotics.  Please call us tomorrow afternoon with an update in your swelling.  Make an appointment to see Dr. Diona Browner next week.   It was a pleasure meeting you!

## 2020-11-27 NOTE — Addendum Note (Signed)
Addended by: Francella Solian on: 11/27/2020 02:56 PM   Modules accepted: Orders

## 2020-11-27 NOTE — Assessment & Plan Note (Signed)
Appears secondary to dental caries/gingival disease, no obvious parotitis noted. Entire right side of face is tender and swollen.   Stop Doxycycline as this is not the best option. Start Augmentin antibiotics, patient agrees.  IM Rocephin 1 g provided today. Will have her wait 15 min post injection.   Consulted with PCP who agrees with plan.   She appears stable, no airway compromise. Strict ED precautions provided. She will call tomorrow with an update. She was also advised to follow up with PCP early next week.

## 2020-11-28 ENCOUNTER — Other Ambulatory Visit: Payer: Self-pay

## 2020-11-28 ENCOUNTER — Telehealth: Payer: Self-pay | Admitting: Family Medicine

## 2020-11-28 ENCOUNTER — Emergency Department
Admission: EM | Admit: 2020-11-28 | Discharge: 2020-11-28 | Disposition: A | Discharge status: Home / Self Care | Payer: Medicare Other | Attending: Emergency Medicine | Admitting: Emergency Medicine

## 2020-11-28 DIAGNOSIS — Z7982 Long term (current) use of aspirin: Secondary | ICD-10-CM | POA: Insufficient documentation

## 2020-11-28 DIAGNOSIS — E039 Hypothyroidism, unspecified: Secondary | ICD-10-CM | POA: Diagnosis not present

## 2020-11-28 DIAGNOSIS — J449 Chronic obstructive pulmonary disease, unspecified: Secondary | ICD-10-CM | POA: Insufficient documentation

## 2020-11-28 DIAGNOSIS — I10 Essential (primary) hypertension: Secondary | ICD-10-CM | POA: Insufficient documentation

## 2020-11-28 DIAGNOSIS — K047 Periapical abscess without sinus: Secondary | ICD-10-CM

## 2020-11-28 DIAGNOSIS — F17210 Nicotine dependence, cigarettes, uncomplicated: Secondary | ICD-10-CM | POA: Insufficient documentation

## 2020-11-28 DIAGNOSIS — R112 Nausea with vomiting, unspecified: Secondary | ICD-10-CM | POA: Diagnosis not present

## 2020-11-28 DIAGNOSIS — K529 Noninfective gastroenteritis and colitis, unspecified: Secondary | ICD-10-CM | POA: Diagnosis not present

## 2020-11-28 DIAGNOSIS — Z85828 Personal history of other malignant neoplasm of skin: Secondary | ICD-10-CM | POA: Insufficient documentation

## 2020-11-28 DIAGNOSIS — Z7952 Long term (current) use of systemic steroids: Secondary | ICD-10-CM | POA: Diagnosis not present

## 2020-11-28 LAB — COMPREHENSIVE METABOLIC PANEL
ALT: 24 U/L (ref 0–44)
AST: 22 U/L (ref 15–41)
Albumin: 4.1 g/dL (ref 3.5–5.0)
Alkaline Phosphatase: 99 U/L (ref 38–126)
Anion gap: 13 (ref 5–15)
BUN: 12 mg/dL (ref 8–23)
CO2: 24 mmol/L (ref 22–32)
Calcium: 9.1 mg/dL (ref 8.9–10.3)
Chloride: 93 mmol/L — ABNORMAL LOW (ref 98–111)
Creatinine, Ser: 0.6 mg/dL (ref 0.44–1.00)
GFR, Estimated: >60 mL/min (ref >60)
Glucose, Bld: 136 mg/dL — ABNORMAL HIGH (ref 70–99)
Potassium: 3.6 mmol/L (ref 3.5–5.1)
Sodium: 130 mmol/L — ABNORMAL LOW (ref 135–145)
Total Bilirubin: 0.8 mg/dL (ref 0.3–1.2)
Total Protein: 8.3 g/dL — ABNORMAL HIGH (ref 6.5–8.1)

## 2020-11-28 LAB — URINALYSIS, COMPLETE (UACMP) WITH MICROSCOPIC
Bacteria, UA: NONE SEEN
Bilirubin Urine: NEGATIVE
Glucose, UA: NEGATIVE mg/dL
Ketones, ur: 80 mg/dL — AB
Leukocytes,Ua: NEGATIVE
Nitrite: NEGATIVE
Protein, ur: 100 mg/dL — AB
RBC / HPF: >50 RBC/hpf — ABNORMAL HIGH (ref 0–5)
Specific Gravity, Urine: 1.025 (ref 1.005–1.030)
pH: 6 (ref 5.0–8.0)

## 2020-11-28 LAB — CBC WITH DIFFERENTIAL/PLATELET
Abs Immature Granulocytes: 0.03 10*3/uL (ref 0.00–0.07)
Basophils Absolute: 0 10*3/uL (ref 0.0–0.1)
Basophils Relative: 0 %
Eosinophils Absolute: 0 10*3/uL (ref 0.0–0.5)
Eosinophils Relative: 0 %
HCT: 47.1 % — ABNORMAL HIGH (ref 36.0–46.0)
Hemoglobin: 16.6 g/dL — ABNORMAL HIGH (ref 12.0–15.0)
Immature Granulocytes: 0 %
Lymphocytes Relative: 10 %
Lymphs Abs: 0.9 10*3/uL (ref 0.7–4.0)
MCH: 29.9 pg (ref 26.0–34.0)
MCHC: 35.2 g/dL (ref 30.0–36.0)
MCV: 84.7 fL (ref 80.0–100.0)
Monocytes Absolute: 0.7 10*3/uL (ref 0.1–1.0)
Monocytes Relative: 8 %
Neutro Abs: 7.6 10*3/uL (ref 1.7–7.7)
Neutrophils Relative %: 82 %
Platelets: 294 10*3/uL (ref 150–400)
RBC: 5.56 MIL/uL — ABNORMAL HIGH (ref 3.87–5.11)
RDW: 14.6 % (ref 11.5–15.5)
WBC: 9.3 10*3/uL (ref 4.0–10.5)
nRBC: 0 % (ref 0.0–0.2)

## 2020-11-28 LAB — LIPASE, BLOOD: Lipase: 23 U/L (ref 11–51)

## 2020-11-28 MED ORDER — SODIUM CHLORIDE 0.9 % IV SOLN
12.5000 mg | Freq: Once | INTRAVENOUS | Status: AC
  Administered 2020-11-28: 09:00:00 12.5 mg via INTRAVENOUS
  Filled 2020-11-28: qty 0.5

## 2020-11-28 MED ORDER — KETOROLAC TROMETHAMINE 30 MG/ML IJ SOLN
15.0000 mg | Freq: Once | INTRAMUSCULAR | Status: AC
  Administered 2020-11-28: 11:00:00 15 mg via INTRAVENOUS
  Filled 2020-11-28: qty 1

## 2020-11-28 MED ORDER — LACTATED RINGERS IV BOLUS
1000.0000 mL | Freq: Once | INTRAVENOUS | Status: AC
  Administered 2020-11-28: 09:00:00 1000 mL via INTRAVENOUS

## 2020-11-28 MED ORDER — PROMETHAZINE HCL 12.5 MG PO TABS: 12.5000 mg | ORAL_TABLET | Freq: Four times a day (QID) | ORAL | 0 refills | Status: DC | PRN

## 2020-11-28 NOTE — Telephone Encounter (Signed)
Pts husband left v/m that pt cannot keep anything down even medicine; pt was in ED earlier and got IV med for vomiting; I called and spoke with pt and her daughter laura;pt said the IV med did not help. Pt was given promethazine 12.5 mg tabs from ED but was advised not to take until 11/29/20 since had IV med today. Dr Diona Browner said pt can pick up promethazine 12.5 mg from pharmacy and take one tab and if that does not help the vomiting pt will need to go back to ED. Pt and pts daughter Mickel Baas is on phone and voiced understanding. Sending note to Dr Diona Browner as Juluis Rainier.

## 2020-11-28 NOTE — ED Triage Notes (Signed)
Pt comes into the ED via POV with her daughter c/o new onset N/V/D that started last night.  Pt currently being treated from an infection on the right side of her face and she has been taking her antibiotics. Pt states she also feels as though she has tachycardia.  Pt currently has even and unlabored respirations at this time.

## 2020-11-28 NOTE — Telephone Encounter (Signed)
Patient call in stated she went to the ED still vomiting not eating cant keep meds down . Made follow up appt with Dr. Diona Browner

## 2020-11-28 NOTE — ED Provider Notes (Signed)
[facility] Emergency Department Provider Note   ____________________________________________   Event Date/Time   First MD Initiated Contact with Patient 11/28/20 [phone]     (approximate)  I have reviewed the triage vital signs and the nursing notes.   HISTORY  Chief Complaint Nausea and Emesis    HPI Tiffany Orozco is a 70 y.o. female with past medical history of hypertension, hyperlipidemia, GAD, COPD, and IBS who presents to the ED complaining of nausea and vomiting.  Patient reports that she initially developed nausea, vomiting, and diarrhea about 3 days ago.  Vomiting and diarrhea are not uncommon for her but current symptoms have been more persistent and frequent, occurring multiple times over the past couple of days.  She reports a temperature as high as 101 yesterday, has not taken any Tylenol or ibuprofen since then.  She does state that she is concurrently dealing with pain and swelling along her right upper mouth and face.  She was started on Augmentin by her PCP yesterday along with Zofran, but Zofran has not helped her nausea and she has been unable to keep down the Augmentin.  She does state that she was given an IM dose of Rocephin yesterday and dental pain and swelling seems to be improving.  She reports diffuse crampy abdominal pain occasionally, but has minimal abdominal pain currently.  She denies any flank pain or dysuria.        Past Medical History:  Diagnosis Date   Allergy    Anxiety    COPD (chronic obstructive pulmonary disease) ([place])    DDD (degenerative disc disease), lumbar    mild   Depression    Female rectocele without uterine prolapse    GERD (gastroesophageal reflux disease)    Hearing loss    Heart murmur    Hyperlipidemia    Hypertension    Hypothyroidism    IBS (irritable bowel syndrome)    Influenza A 06/2015   Obesity    Osteopenia    Plantar fasciitis    Pneumonia    Skin cancer of arm    Skin cancer of  face    Stroke ([place]) 07/2010   no permenant deficits   Thrombocytopenia ([place])    Tubal pregnancy     Patient Active Problem List   Diagnosis Date Noted   Right facial swelling 11/27/2020   Diarrhea 08/10/2020   Status post repair of ventral hernia 62/22/9798   Umbilical hernia without obstruction and without gangrene    Acoustic trauma (explosive) to ear, bilateral 04/03/2019   Esophagitis 09/02/2016   PUD (peptic ulcer disease) 09/02/2016   Coronary atherosclerosis of native coronary artery 05/04/2016   Tobacco abuse 06/09/2015   History of CVA (cerebrovascular accident) 06/08/2015   IBS (irritable bowel syndrome) 06/08/2015   Chronic nausea 01/17/2015   Chronic back pain 07/12/2014   Dysphagia 04/19/2014   Fatigue 09/12/2012   HTN (hypertension) 11/30/2011   Herniation of rectum into vagina 08/29/2011   Hyponatremia 03/26/2011   Dyspnea 03/23/2011   CONSTIPATION, CHRONIC 03/19/2010   Osteopenia 03/19/2010   Eustachian tube dysfunction 10/21/2009   MURMUR 03/20/2009   UNSPECIFIED CARDIAC DYSRHYTHMIA 03/06/2009   Allergic rhinitis 08/14/2007   Hypothyroidism 02/27/2007   HYPERCHOLESTEROLEMIA 02/27/2007   Generalized anxiety disorder 02/27/2007   Depression, major, single episode, moderate ([place]) 02/27/2007   GERD 02/27/2007    Past Surgical History:  Procedure Laterality Date   APPENDECTOMY     CHOLECYSTECTOMY  2004   COLONOSCOPY  CYST EXCISION Right    breast mole   EMGs  7/04   atms and wrists neg   ESOPHAGEAL MANOMETRY N/A 03/17/2015   Procedure: ESOPHAGEAL MANOMETRY (EM);  Surgeon: Manus Gunning, MD;  Location: [facility];  Service: Gastroenterology;  Laterality: N/A;   FOOT SURGERY Right 2008, 2009, 2010   torn ligament, hammertoes   TUBAL LIGATION  1975   UPPER GI ENDOSCOPY     XI ROBOTIC ASSISTED VENTRAL HERNIA N/A 04/14/2020   Procedure: XI ROBOTIC ASSISTED VENTRAL HERNIA;  Surgeon: Fredirick Maudlin, MD;  Location: [facility] ORS;  Service:  General;  Laterality: N/A;    Prior to Admission medications   Medication Sig Start Date End Date Taking? Authorizing Provider  promethazine (PHENERGAN) 12.5 MG tablet Take 1 tablet (12.5 mg total) by mouth every 6 (six) hours as needed for nausea or vomiting. 11/28/20  Yes Blake Divine, MD  amoxicillin-clavulanate (AUGMENTIN) 875-125 MG tablet Take 1 tablet by mouth 2 (two) times daily. 11/27/20   Pleas Koch, NP  ARMOUR THYROID 90 MG tablet Take one tablet by mouth daily. 02/29/20   Bedsole, Amy E, MD  Cholecalciferol (VITAMIN D3) 125 MCG (5000 UT) TABS Take 5,000 Units by mouth daily.    [provider]  dipyridamole-aspirin (AGGRENOX) 200-25 MG 12hr capsule Take 1 capsule by mouth 2 (two) times daily. 02/29/20   Bedsole, Amy E, MD  fluticasone (FLONASE) 50 MCG/ACT nasal spray Place 1-2 sprays into both nostrils daily as needed for allergies or rhinitis.    [provider]  ibuprofen (ADVIL) 800 MG tablet Take 1 tablet (800 mg total) by mouth every 8 (eight) hours as needed. 04/14/20   Fredirick Maudlin, MD  ondansetron (ZOFRAN) 8 MG tablet TAKE 1 TABLET BY MOUTH EVERY 8 HOURS AS NEEDED FOR NAUSEA OR VOMITING. 07/08/20   Diona Browner, Amy E, MD    Allergies Lisinopril, Niacin, Welchol [colesevelam hcl], Augmentin [amoxicillin-pot clavulanate], Codeine, and Oxycodone hcl  Family History  Problem Relation Age of Onset   Skin cancer Father    Hypertension Mother    Lung cancer Brother    Colon cancer Sister    Esophageal cancer Sister    Ovarian cancer Sister    Stomach cancer Neg Hx    Pancreatic cancer Neg Hx     Social History Social History   Tobacco Use   Smoking status: Every Day    Packs/day: 0.75    Years: 31.00    Pack years: 23.25    Types: Cigarettes   Smokeless tobacco: Never   Tobacco comments:    States she is not smoking during current illness 05/29/18 sb  Vaping Use   Vaping Use: Never used  Substance Use Topics   Alcohol use: No     Alcohol/week: 0.0 standard drinks   Drug use: No    Review of Systems  Constitutional: No fever/chills Eyes: No visual changes. ENT: No sore throat. Cardiovascular: Denies chest pain. Respiratory: Denies shortness of breath. Gastrointestinal: Positive for abdominal pain, nausea, vomiting, and diarrhea.  No constipation. Genitourinary: Negative for dysuria. Musculoskeletal: Negative for back pain. Skin: Negative for rash. Neurological: Negative for headaches, focal weakness or numbness.  ____________________________________________   PHYSICAL EXAM:  VITAL SIGNS: ED Triage Vitals  Enc Vitals Group     BP 11/28/20 0741 (!) 187/80     Pulse Rate 11/28/20 0741 93     Resp 11/28/20 0741 18     Temp 11/28/20 0741 98.2 F (36.8 C)  Temp Source 11/28/20 0741 Oral     SpO2 11/28/20 0741 96 %     Weight 11/28/20 0739 160 lb (72.6 kg)     Height 11/28/20 0739 5' (1.524 m)     Head Circumference --      Peak Flow --      Pain Score 11/28/20 0738 10     Pain Loc --      Pain Edu? --      Excl. in [place]? --     Constitutional: Alert and oriented. Eyes: Conjunctivae are normal. Head: Atraumatic. Nose: No congestion/rhinnorhea. Mouth/Throat: Mucous membranes are moist.  Erythema and mild edema along the right upper gumline with with associated tenderness but no focal fluctuance.  No facial edema or tenderness to palpation. Neck: Normal ROM Cardiovascular: Normal rate, regular rhythm. Grossly normal heart sounds.  2+ radial pulses bilaterally. Respiratory: Normal respiratory effort.  No retractions. Lungs CTAB. Gastrointestinal: Soft and nontender. No distention. Genitourinary: deferred Musculoskeletal: No lower extremity tenderness nor edema. Neurologic:  Normal speech and language. No gross focal neurologic deficits are appreciated. Skin:  Skin is warm, dry and intact. No rash noted. Psychiatric: Mood and affect are normal. Speech and behavior are  normal.  ____________________________________________   LABS (all labs ordered are listed, but only abnormal results are displayed)  Labs Reviewed  COMPREHENSIVE METABOLIC PANEL - Abnormal; Notable for the following components:      Result Value   Sodium 130 (*)    Chloride 93 (*)    Glucose, Bld 136 (*)    Total Protein 8.3 (*)    All other components within normal limits  CBC WITH DIFFERENTIAL/PLATELET - Abnormal; Notable for the following components:   RBC 5.56 (*)    Hemoglobin 16.6 (*)    HCT 47.1 (*)    All other components within normal limits  URINALYSIS, COMPLETE (UACMP) WITH MICROSCOPIC - Abnormal; Notable for the following components:   Color, Urine YELLOW (*)    APPearance CLOUDY (*)    Hgb urine dipstick MODERATE (*)    Ketones, ur 80 (*)    Protein, ur 100 (*)    RBC / HPF >50 (*)    All other components within normal limits  LIPASE, BLOOD     PROCEDURES  Procedure(s) performed (including Critical Care):  Procedures   ____________________________________________   INITIAL IMPRESSION / ASSESSMENT AND PLAN / ED COURSE      70 year old female with past medical history of hypertension, hyperlipidemia, COPD, GAD, IBS who presents to the ED complaining of persistent nausea and vomiting over the past 3 days associated with dental pain and swelling.  Dental pain and swelling seem to have improved from yesterday, when she received IM Rocephin, although she has had difficulty tolerating the Augmentin.  This pain and swelling does seem to be due to dental infection, no evidence of focal abscess, or other extension of infection.  Patient describes some crampy abdominal pain but has no abdominal tenderness on exam, vomiting and diarrhea most consistent with a gastroenteritis.  We will treat her nausea with IV Phenergan, hydrate with IV fluids and screen labs.  Patient feeling better following dose of IV Phenergan, now tolerating water and crackers without difficulty.   Labs are unremarkable and UA does not appear consistent with infection.  She is appropriate for discharge home with plan to continue Augmentin for oral infection and to follow-up with her dentist as well as her PCP.  She was counseled to return to the ED for  new or worsening symptoms, patient and daughter agree with plan.      ____________________________________________   FINAL CLINICAL IMPRESSION(S) / ED DIAGNOSES  Final diagnoses:  Gastroenteritis  Non-intractable vomiting with nausea, unspecified vomiting type  Dental infection     ED Discharge Orders          Ordered    promethazine (PHENERGAN) 12.5 MG tablet  Every 6 hours PRN        11/28/20 1248             Note:  This document was prepared using Dragon voice recognition software and may include unintentional dictation errors.    Blake Divine, MD 11/28/20 1250

## 2020-11-28 NOTE — Telephone Encounter (Signed)
Noted  

## 2020-11-29 ENCOUNTER — Emergency Department: Payer: Medicare Other

## 2020-11-29 ENCOUNTER — Other Ambulatory Visit: Payer: Self-pay

## 2020-11-29 ENCOUNTER — Emergency Department
Admission: EM | Admit: 2020-11-29 | Discharge: 2020-11-29 | Disposition: A | Discharge status: Home / Self Care | Payer: Medicare Other | Attending: Student in an Organized Health Care Education/Training Program | Admitting: Student in an Organized Health Care Education/Training Program

## 2020-11-29 DIAGNOSIS — F17210 Nicotine dependence, cigarettes, uncomplicated: Secondary | ICD-10-CM | POA: Insufficient documentation

## 2020-11-29 DIAGNOSIS — R112 Nausea with vomiting, unspecified: Secondary | ICD-10-CM | POA: Diagnosis not present

## 2020-11-29 DIAGNOSIS — K2900 Acute gastritis without bleeding: Secondary | ICD-10-CM | POA: Insufficient documentation

## 2020-11-29 DIAGNOSIS — I10 Essential (primary) hypertension: Secondary | ICD-10-CM | POA: Insufficient documentation

## 2020-11-29 DIAGNOSIS — J449 Chronic obstructive pulmonary disease, unspecified: Secondary | ICD-10-CM | POA: Insufficient documentation

## 2020-11-29 DIAGNOSIS — E039 Hypothyroidism, unspecified: Secondary | ICD-10-CM | POA: Insufficient documentation

## 2020-11-29 DIAGNOSIS — R1110 Vomiting, unspecified: Secondary | ICD-10-CM | POA: Diagnosis not present

## 2020-11-29 DIAGNOSIS — Z85828 Personal history of other malignant neoplasm of skin: Secondary | ICD-10-CM | POA: Insufficient documentation

## 2020-11-29 DIAGNOSIS — Z7982 Long term (current) use of aspirin: Secondary | ICD-10-CM | POA: Insufficient documentation

## 2020-11-29 DIAGNOSIS — Z79899 Other long term (current) drug therapy: Secondary | ICD-10-CM | POA: Insufficient documentation

## 2020-11-29 DIAGNOSIS — R079 Chest pain, unspecified: Secondary | ICD-10-CM | POA: Diagnosis not present

## 2020-11-29 LAB — COMPREHENSIVE METABOLIC PANEL
ALT: 23 U/L (ref 0–44)
AST: 19 U/L (ref 15–41)
Albumin: 3.7 g/dL (ref 3.5–5.0)
Alkaline Phosphatase: 83 U/L (ref 38–126)
Anion gap: 11 (ref 5–15)
BUN: 16 mg/dL (ref 8–23)
CO2: 23 mmol/L (ref 22–32)
Calcium: 8.9 mg/dL (ref 8.9–10.3)
Chloride: 95 mmol/L — ABNORMAL LOW (ref 98–111)
Creatinine, Ser: 0.52 mg/dL (ref 0.44–1.00)
GFR, Estimated: >60 mL/min (ref >60)
Glucose, Bld: 132 mg/dL — ABNORMAL HIGH (ref 70–99)
Potassium: 3.5 mmol/L (ref 3.5–5.1)
Sodium: 129 mmol/L — ABNORMAL LOW (ref 135–145)
Total Bilirubin: 0.9 mg/dL (ref 0.3–1.2)
Total Protein: 7.4 g/dL (ref 6.5–8.1)

## 2020-11-29 LAB — TROPONIN I (HIGH SENSITIVITY)
Troponin I (High Sensitivity): 5 ng/L (ref <18)
Troponin I (High Sensitivity): 6 ng/L (ref <18)

## 2020-11-29 LAB — CBC
HCT: 46.4 % — ABNORMAL HIGH (ref 36.0–46.0)
Hemoglobin: 16.1 g/dL — ABNORMAL HIGH (ref 12.0–15.0)
MCH: 29.2 pg (ref 26.0–34.0)
MCHC: 34.7 g/dL (ref 30.0–36.0)
MCV: 84.2 fL (ref 80.0–100.0)
Platelets: 324 10*3/uL (ref 150–400)
RBC: 5.51 MIL/uL — ABNORMAL HIGH (ref 3.87–5.11)
RDW: 14.6 % (ref 11.5–15.5)
WBC: 9.9 10*3/uL (ref 4.0–10.5)
nRBC: 0 % (ref 0.0–0.2)

## 2020-11-29 LAB — URINALYSIS, COMPLETE (UACMP) WITH MICROSCOPIC
Bacteria, UA: NONE SEEN
Bilirubin Urine: NEGATIVE
Glucose, UA: NEGATIVE mg/dL
Ketones, ur: 20 mg/dL — AB
Leukocytes,Ua: NEGATIVE
Nitrite: NEGATIVE
Protein, ur: NEGATIVE mg/dL
Specific Gravity, Urine: >1.046 — ABNORMAL HIGH (ref 1.005–1.030)
pH: 6 (ref 5.0–8.0)

## 2020-11-29 LAB — LIPASE, BLOOD: Lipase: 21 U/L (ref 11–51)

## 2020-11-29 MED ORDER — SODIUM CHLORIDE 0.9 % IV BOLUS
1000.0000 mL | Freq: Once | INTRAVENOUS | Status: AC
  Administered 2020-11-29: 1000 mL via INTRAVENOUS

## 2020-11-29 MED ORDER — IOHEXOL 300 MG/ML  SOLN
100.0000 mL | Freq: Once | INTRAMUSCULAR | Status: AC | PRN
  Administered 2020-11-29: 100 mL via INTRAVENOUS

## 2020-11-29 MED ORDER — DROPERIDOL 2.5 MG/ML IJ SOLN
2.5000 mg | Freq: Once | INTRAMUSCULAR | Status: AC
  Administered 2020-11-29: 2.5 mg via INTRAVENOUS
  Filled 2020-11-29: qty 2

## 2020-11-29 MED ORDER — PANTOPRAZOLE SODIUM 20 MG PO TBEC: 20.0000 mg | DELAYED_RELEASE_TABLET | Freq: Every day | ORAL | 1 refills | Status: DC

## 2020-11-29 MED ORDER — ONDANSETRON 4 MG PO TBDP
4.0000 mg | ORAL_TABLET | Freq: Once | ORAL | Status: AC
  Administered 2020-11-29: 4 mg via ORAL
  Filled 2020-11-29: qty 1

## 2020-11-29 MED ORDER — PANTOPRAZOLE SODIUM 40 MG IV SOLR
40.0000 mg | Freq: Once | INTRAVENOUS | Status: AC
  Administered 2020-11-29: 40 mg via INTRAVENOUS
  Filled 2020-11-29: qty 40

## 2020-11-29 MED ORDER — ONDANSETRON 4 MG PO TBDP: 4.0000 mg | ORAL_TABLET | Freq: Three times a day (TID) | ORAL | 0 refills | Status: DC | PRN

## 2020-11-29 NOTE — ED Notes (Signed)
Pt ambulatory to the bathroom at this time.

## 2020-11-29 NOTE — ED Notes (Signed)
Pt passed PO challenge, states she ate and drank w/out nausea or difficulty.

## 2020-11-29 NOTE — ED Notes (Signed)
Pt given water and crackers for PO challenge

## 2020-11-29 NOTE — Discharge Instructions (Addendum)

## 2020-11-29 NOTE — ED Triage Notes (Addendum)
Pt was seen here yesterday for n/v and facial swelling due to dental abscess, pt states she has become more nauseous and vomiting more today. Pt has been unable to keep anything down including prescribed meds. Pt also c/o generalized chest pain

## 2020-11-29 NOTE — ED Provider Notes (Signed)
[facility] Emergency Department Provider Note    Event Date/Time   First MD Initiated Contact with Patient 11/29/20 [phone]     (approximate)  I have reviewed the triage vital signs and the nursing notes.   HISTORY  Chief Complaint Nausea, Emesis, and Chest Pain    HPI Tiffany Orozco is a 70 y.o. female below listed past medical history presents to the ER for evaluation of intractable nausea vomiting started over the past few days.  States she is a history of chronic issues with irritable bowel syndrome as well as nausea vomiting and diarrhea.  Was recently put on antibiotics for dental infection.  Started having worsening symptoms after taking antibiotics.  She not having fevers.  She was seen here yesterday for nausea vomiting but is progressively worsened over the past 24 hours she is not able to keep anything down states that she has not urinated since yesterday.  Does have a history of ventral hernia repair.  Describes moderate to severe epigastric pain.  Past Medical History:  Diagnosis Date   Allergy    Anxiety    COPD (chronic obstructive pulmonary disease) (HCC)    DDD (degenerative disc disease), lumbar    mild   Depression    Female rectocele without uterine prolapse    GERD (gastroesophageal reflux disease)    Hearing loss    Heart murmur    Hyperlipidemia    Hypertension    Hypothyroidism    IBS (irritable bowel syndrome)    Influenza A 06/2015   Obesity    Osteopenia    Plantar fasciitis    Pneumonia    Skin cancer of arm    Skin cancer of face    Stroke ([place]) 07/2010   no permenant deficits   Thrombocytopenia (HCC)    Tubal pregnancy    Family History  Problem Relation Age of Onset   Skin cancer Father    Hypertension Mother    Lung cancer Brother    Colon cancer Sister    Esophageal cancer Sister    Ovarian cancer Sister    Stomach cancer Neg Hx    Pancreatic cancer Neg Hx    Past Surgical History:  Procedure  Laterality Date   APPENDECTOMY     CHOLECYSTECTOMY  2004   COLONOSCOPY     CYST EXCISION Right    breast mole   EMGs  7/04   atms and wrists neg   ESOPHAGEAL MANOMETRY N/A 03/17/2015   Procedure: ESOPHAGEAL MANOMETRY (EM);  Surgeon: Manus Gunning, MD;  Location: [facility];  Service: Gastroenterology;  Laterality: N/A;   FOOT SURGERY Right 2008, 2009, 2010   torn ligament, hammertoes   TUBAL LIGATION  1975   UPPER GI ENDOSCOPY     XI ROBOTIC ASSISTED VENTRAL HERNIA N/A 04/14/2020   Procedure: XI ROBOTIC ASSISTED VENTRAL HERNIA;  Surgeon: Fredirick Maudlin, MD;  Location: [facility] ORS;  Service: General;  Laterality: N/A;   Patient Active Problem List   Diagnosis Date Noted   Right facial swelling 11/27/2020   Diarrhea 08/10/2020   Status post repair of ventral hernia 02/40/9735   Umbilical hernia without obstruction and without gangrene    Acoustic trauma (explosive) to ear, bilateral 04/03/2019   Esophagitis 09/02/2016   PUD (peptic ulcer disease) 09/02/2016   Coronary atherosclerosis of native coronary artery 05/04/2016   Tobacco abuse 06/09/2015   History of CVA (cerebrovascular accident) 06/08/2015   IBS (irritable bowel syndrome) 06/08/2015   Chronic nausea  01/17/2015   Chronic back pain 07/12/2014   Dysphagia 04/19/2014   Fatigue 09/12/2012   HTN (hypertension) 11/30/2011   Herniation of rectum into vagina 08/29/2011   Hyponatremia 03/26/2011   Dyspnea 03/23/2011   CONSTIPATION, CHRONIC 03/19/2010   Osteopenia 03/19/2010   Eustachian tube dysfunction 10/21/2009   MURMUR 03/20/2009   UNSPECIFIED CARDIAC DYSRHYTHMIA 03/06/2009   Allergic rhinitis 08/14/2007   Hypothyroidism 02/27/2007   HYPERCHOLESTEROLEMIA 02/27/2007   Generalized anxiety disorder 02/27/2007   Depression, major, single episode, moderate ([place]) 02/27/2007   GERD 02/27/2007      Prior to Admission medications   Medication Sig Start Date End Date Taking? Authorizing Provider   amoxicillin-clavulanate (AUGMENTIN) 875-125 MG tablet Take 1 tablet by mouth 2 (two) times daily. 11/27/20   Pleas Koch, NP  ARMOUR THYROID 90 MG tablet Take one tablet by mouth daily. 02/29/20   Bedsole, Amy E, MD  Cholecalciferol (VITAMIN D3) 125 MCG (5000 UT) TABS Take 5,000 Units by mouth daily.    [provider]  dipyridamole-aspirin (AGGRENOX) 200-25 MG 12hr capsule Take 1 capsule by mouth 2 (two) times daily. 02/29/20   Bedsole, Amy E, MD  fluticasone (FLONASE) 50 MCG/ACT nasal spray Place 1-2 sprays into both nostrils daily as needed for allergies or rhinitis.    [provider]  ibuprofen (ADVIL) 800 MG tablet Take 1 tablet (800 mg total) by mouth every 8 (eight) hours as needed. 04/14/20   Fredirick Maudlin, MD  ondansetron (ZOFRAN) 8 MG tablet TAKE 1 TABLET BY MOUTH EVERY 8 HOURS AS NEEDED FOR NAUSEA OR VOMITING. 07/08/20   Bedsole, Amy E, MD  promethazine (PHENERGAN) 12.5 MG tablet Take 1 tablet (12.5 mg total) by mouth every 6 (six) hours as needed for nausea or vomiting. 11/28/20   Blake Divine, MD    Allergies Lisinopril, Niacin, Welchol [colesevelam hcl], Augmentin [amoxicillin-pot clavulanate], Codeine, and Oxycodone hcl    Social History Social History   Tobacco Use   Smoking status: Every Day    Packs/day: 0.75    Years: 31.00    Pack years: 23.25    Types: Cigarettes   Smokeless tobacco: Never   Tobacco comments:    States she is not smoking during current illness 05/29/18 sb  Vaping Use   Vaping Use: Never used  Substance Use Topics   Alcohol use: No    Alcohol/week: 0.0 standard drinks   Drug use: No    Review of Systems Patient denies headaches, rhinorrhea, blurry vision, numbness, shortness of breath, chest pain, edema, cough, abdominal pain, nausea, vomiting, diarrhea, dysuria, fevers, rashes or hallucinations unless otherwise stated above in HPI. ____________________________________________   PHYSICAL EXAM:  VITAL  SIGNS: Vitals:   11/29/20 1210 11/29/20 1230  BP: 101/63 (!) 146/85  Pulse: 93 93  Resp: 16 17  Temp:    SpO2: 95% 97%    Constitutional: Alert and oriented.  Eyes: Conjunctivae are normal.  Head: Atraumatic. Nose: No congestion/rhinnorhea. Mouth/Throat: Mucous membranes are moist.   Neck: No stridor. Painless ROM.  Cardiovascular: Normal rate, regular rhythm. Grossly normal heart sounds.  Good peripheral circulation. Respiratory: Normal respiratory effort.  No retractions. Lungs CTAB. Gastrointestinal: Soft with mild epigastric ttp. No distention. No abdominal bruits. No CVA tenderness. Genitourinary:  Musculoskeletal: No lower extremity tenderness nor edema.  No joint effusions. Neurologic:  Normal speech and language. No gross focal neurologic deficits are appreciated. No facial droop Skin:  Skin is warm, dry and intact. No rash noted. Psychiatric: Mood and affect are normal.  Speech and behavior are normal.  ____________________________________________   LABS (all labs ordered are listed, but only abnormal results are displayed)  Results for orders placed or performed during the hospital encounter of 11/29/20 (from the past 24 hour(s))  Lipase, blood     Status: None   Collection Time: 11/29/20  3:01 AM  Result Value Ref Range   Lipase 21 11 - 51 U/L  Comprehensive metabolic panel     Status: Abnormal   Collection Time: 11/29/20  3:01 AM  Result Value Ref Range   Sodium 129 (L) 135 - 145 mmol/L   Potassium 3.5 3.5 - 5.1 mmol/L   Chloride 95 (L) 98 - 111 mmol/L   CO2 23 22 - 32 mmol/L   Glucose, Bld 132 (H) 70 - 99 mg/dL   BUN 16 8 - 23 mg/dL   Creatinine, Ser 0.52 0.44 - 1.00 mg/dL   Calcium 8.9 8.9 - 10.3 mg/dL   Total Protein 7.4 6.5 - 8.1 g/dL   Albumin 3.7 3.5 - 5.0 g/dL   AST 19 15 - 41 U/L   ALT 23 0 - 44 U/L   Alkaline Phosphatase 83 38 - 126 U/L   Total Bilirubin 0.9 0.3 - 1.2 mg/dL   GFR, Estimated >60 >60 mL/min   Anion gap 11 5 - 15  CBC     Status:  Abnormal   Collection Time: 11/29/20  3:01 AM  Result Value Ref Range   WBC 9.9 4.0 - 10.5 K/uL   RBC 5.51 (H) 3.87 - 5.11 MIL/uL   Hemoglobin 16.1 (H) 12.0 - 15.0 g/dL   HCT 46.4 (H) 36.0 - 46.0 %   MCV 84.2 80.0 - 100.0 fL   MCH 29.2 26.0 - 34.0 pg   MCHC 34.7 30.0 - 36.0 g/dL   RDW 14.6 11.5 - 15.5 %   Platelets 324 150 - 400 K/uL   nRBC 0.0 0.0 - 0.2 %  Troponin I (High Sensitivity)     Status: None   Collection Time: 11/29/20  3:01 AM  Result Value Ref Range   Troponin I (High Sensitivity) 5 <18 ng/L  Urinalysis, Complete w Microscopic Urine, Clean Catch     Status: Abnormal   Collection Time: 11/29/20  8:40 AM  Result Value Ref Range   Color, Urine YELLOW (A) YELLOW   APPearance HAZY (A) CLEAR   Specific Gravity, Urine >1.046 (H) 1.005 - 1.030   pH 6.0 5.0 - 8.0   Glucose, UA NEGATIVE NEGATIVE mg/dL   Hgb urine dipstick MODERATE (A) NEGATIVE   Bilirubin Urine NEGATIVE NEGATIVE   Ketones, ur 20 (A) NEGATIVE mg/dL   Protein, ur NEGATIVE NEGATIVE mg/dL   Nitrite NEGATIVE NEGATIVE   Leukocytes,Ua NEGATIVE NEGATIVE   RBC / HPF 6-10 0 - 5 RBC/hpf   WBC, UA 0-5 0 - 5 WBC/hpf   Bacteria, UA NONE SEEN NONE SEEN   Squamous Epithelial / LPF 0-5 0 - 5   Mucus PRESENT   Troponin I (High Sensitivity)     Status: None   Collection Time: 11/29/20  8:41 AM  Result Value Ref Range   Troponin I (High Sensitivity) 6 <18 ng/L   ____________________________________________  EKG My review and personal interpretation at Time: 9:25   Indication: n/v  Rate: 105  Rhythm: sinus Axis: normal Other: normal intervals, no stemi ____________________________________________  RADIOLOGY  I personally reviewed all radiographic images ordered to evaluate for the above acute complaints and reviewed radiology reports and findings.  These findings  were personally discussed with the patient.  Please see medical record for radiology  report.  ____________________________________________   PROCEDURES  Procedure(s) performed:  Procedures    Critical Care performed: no ____________________________________________   INITIAL IMPRESSION / ASSESSMENT AND PLAN / ED COURSE  Pertinent labs & imaging results that were available during my care of the patient were reviewed by me and considered in my medical decision making (see chart for details).   DDX: gastritis, enteritis, sbo, perforation, esophagitis, dehydration, colitis, diverticulitis  Tiffany Orozco is a 70 y.o. who presents to the ED with presents to the ER for evaluation of nausea vomiting epigastric discomfort.  She is nontoxic-appearing.  Given IV fluids.  Was given antiemetics.  States that stress and anxiety does make her symptoms worse.  Gave her droperidol with improvement.  Given her persistent symptoms and history of previous surgery CT imaging ordered to evaluate.  No evidence of obstruction does have evidence of duodenitis and suspected gastritis.  Will give antiemetic.  Not consistent with cardiac etiology.  Her face does not appear acutely infected I do not appreciate any abscess.  Not consistent with [place]'s.  The patient will be placed on continuous pulse oximetry and telemetry for monitoring.  Laboratory evaluation will be sent to evaluate for the above complaints.     Clinical Course as of 11/29/20 1301  Sat Nov 29, 2020  1102 Patient reassessed.  Feels improved.  CT imaging with some findings suggestive of duodenitis.  Given Protonix.  Get p.o. challenge.  Given lack of white count no appreciable facial cellulitis or abscess do think she will be appropriate for outpatient follow-up. [PR]    Clinical Course User Index [PR] Merlyn Lot, MD    The patient was evaluated in Emergency Department today for the symptoms described in the history of present illness. He/she was evaluated in the context of the global COVID-19 pandemic, which  necessitated consideration that the patient might be at risk for infection with the SARS-CoV-2 virus that causes COVID-19. Institutional protocols and algorithms that pertain to the evaluation of patients at risk for COVID-19 are in a state of rapid change based on information released by regulatory bodies including the CDC and federal and state organizations. These policies and algorithms were followed during the patient's care in the ED.  As part of my medical decision making, I reviewed the following data within the [facility] notes reviewed and incorporated, Labs reviewed, notes from prior ED visits and  Controlled Substance Database   ____________________________________________   FINAL CLINICAL IMPRESSION(S) / ED DIAGNOSES  Final diagnoses:  Acute gastritis without hemorrhage, unspecified gastritis type  Non-intractable vomiting with nausea, unspecified vomiting type      NEW MEDICATIONS STARTED DURING THIS VISIT:  New Prescriptions   No medications on file     Note:  This document was prepared using Dragon voice recognition software and may include unintentional dictation errors.    Merlyn Lot, MD 11/29/20 [phone]

## 2020-12-03 ENCOUNTER — Ambulatory Visit (INDEPENDENT_AMBULATORY_CARE_PROVIDER_SITE_OTHER): Payer: Medicare Other | Admitting: Family Medicine

## 2020-12-03 ENCOUNTER — Encounter: Payer: Self-pay | Admitting: Family Medicine

## 2020-12-03 ENCOUNTER — Telehealth: Payer: Self-pay | Admitting: Family Medicine

## 2020-12-03 ENCOUNTER — Other Ambulatory Visit: Payer: Self-pay

## 2020-12-03 VITALS — BP 134/60 | HR 77 | Temp 97.8°F | Ht 60.0 in | Wt 163.0 lb

## 2020-12-03 DIAGNOSIS — R110 Nausea: Secondary | ICD-10-CM | POA: Diagnosis not present

## 2020-12-03 DIAGNOSIS — K029 Dental caries, unspecified: Secondary | ICD-10-CM | POA: Diagnosis not present

## 2020-12-03 DIAGNOSIS — E871 Hypo-osmolality and hyponatremia: Secondary | ICD-10-CM | POA: Diagnosis not present

## 2020-12-03 DIAGNOSIS — R3989 Other symptoms and signs involving the genitourinary system: Secondary | ICD-10-CM | POA: Diagnosis not present

## 2020-12-03 DIAGNOSIS — R220 Localized swelling, mass and lump, head: Secondary | ICD-10-CM | POA: Diagnosis not present

## 2020-12-03 LAB — POC URINALSYSI DIPSTICK (AUTOMATED)
Bilirubin, UA: NEGATIVE
Glucose, UA: NEGATIVE
Ketones, UA: NEGATIVE
Leukocytes, UA: NEGATIVE
Nitrite, UA: NEGATIVE
Protein, UA: NEGATIVE
Spec Grav, UA: 1.01 (ref 1.010–1.025)
Urobilinogen, UA: 0.2 E.U./dL
pH, UA: 7 (ref 5.0–8.0)

## 2020-12-03 LAB — BASIC METABOLIC PANEL
BUN: 7 mg/dL (ref 6–23)
CO2: 27 mEq/L (ref 19–32)
Calcium: 8.8 mg/dL (ref 8.4–10.5)
Chloride: 96 mEq/L (ref 96–112)
Creatinine, Ser: 0.57 mg/dL (ref 0.40–1.20)
GFR: 90.96 mL/min (ref >60.00)
Glucose, Bld: 77 mg/dL (ref 70–99)
Potassium: 4.4 mEq/L (ref 3.5–5.1)
Sodium: 131 mEq/L — ABNORMAL LOW (ref 135–145)

## 2020-12-03 NOTE — Progress Notes (Signed)
Patient ID: Tiffany Orozco, female    DOB: Nov 01, 1950, 70 y.o.   MRN: [id number]  This visit was conducted in person.  BP 134/60   Pulse 77   Temp 97.8 F (36.6 C) (Temporal)   Ht 5' (1.524 m)   Wt 163 lb (73.9 kg)   SpO2 98%   BMI 31.83 kg/m    CC: Chief Complaint  Patient presents with   Follow-up    Right Facial Swelling/Dental Caries-seen by K. Carlis Abbott 11/27/20/E 7/1 & 7/2    Subjective:   [name] is a 70 y.o. female presenting on 12/03/2020 for Follow-up (Right Facial Swelling/Dental Caries-seen by K. Clark 11/27/20/E 7/1 & 7/2)  Reviewed last OV  11/27/20 with Allie Bossier ( for right facial swelling and dental caries, right upper) and recent Urgent Care/ED visits  6/29, 7/1 and 7/2 for intractable vomiting and acute gastritis.. Initially treated with  doxycycline, start 6/29  Symptoms continued to worsen on 6/30 with increase in pain and right facial swelling.  Stopped doxy and changed to Augmentin. Given IM rocephin    Had fever up to 101 and severe nause/e,mesis with Augmentin.  Given IV phenergan and IVF in ER on 7/1  Given po phenergan. 7/2 Seen again in ED  with inability to tolerate po and chest pain. Abd CT was unremarkable. Facial and dental infeciton was noted to be improved.    Stopped Augmentin 11/29/2020  She has noted resolution of swelling in right face, still slightly tender in right upper jaw.  No emesis  in last 4 days. She has had improved nausea. No chest pain, still sore at upper abdomen. She is having normal  water intake. Urinating like normal.  She has been eating olives to get Na up.  She wants to be tested for radiation exposure. She thinks that  oral sex with her husband has exposed her to radiation and may have cause hair loss, tooth damage and chronic nausea. She wants to be tested for possible exposure given husband with  prostate caner ( treated with radiation and prostate seeds.)   She spoke with poison control and the  recommended some testing.  Has appt at end of month for dental evaluation. She feels facial swelling is not form teeth and was instead a reaction to insecticide she was spraying the day before.   She also noted urine darke=ning and low back pain.  Relevant past medical, surgical, family and social history reviewed and updated as indicated. Interim medical history since our last visit reviewed. Allergies and medications reviewed and updated. Outpatient Medications Prior to Visit  Medication Sig Dispense Refill   amoxicillin-clavulanate (AUGMENTIN) 875-125 MG tablet Take 1 tablet by mouth 2 (two) times daily. 20 tablet 0   ARMOUR THYROID 90 MG tablet Take one tablet by mouth daily. 90 tablet 3   Cholecalciferol (VITAMIN D3) 125 MCG (5000 UT) TABS Take 5,000 Units by mouth daily.     dipyridamole-aspirin (AGGRENOX) 200-25 MG 12hr capsule Take 1 capsule by mouth 2 (two) times daily. 180 capsule 3   fluticasone (FLONASE) 50 MCG/ACT nasal spray Place 1-2 sprays into both nostrils daily as needed for allergies or rhinitis.     ondansetron (ZOFRAN ODT) 4 MG disintegrating tablet Take 1 tablet (4 mg total) by mouth every 8 (eight) hours as needed for nausea or vomiting. 20 tablet 0   ondansetron (ZOFRAN) 8 MG tablet TAKE 1 TABLET BY MOUTH EVERY 8 HOURS AS NEEDED FOR NAUSEA OR  VOMITING. 90 tablet 0   ibuprofen (ADVIL) 800 MG tablet Take 1 tablet (800 mg total) by mouth every 8 (eight) hours as needed. 30 tablet 0   pantoprazole (PROTONIX) 20 MG tablet Take 1 tablet (20 mg total) by mouth daily. 30 tablet 1   promethazine (PHENERGAN) 12.5 MG tablet Take 1 tablet (12.5 mg total) by mouth every 6 (six) hours as needed for nausea or vomiting. 15 tablet 0   No facility-administered medications prior to visit.     Per HPI unless specifically indicated in ROS section below Review of Systems  Constitutional:  Negative for fatigue and fever.  HENT:  Negative for ear pain.   Eyes:  Negative for pain.   Respiratory:  Negative for chest tightness and shortness of breath.   Cardiovascular:  Negative for chest pain, palpitations and leg swelling.  Gastrointestinal:  Negative for abdominal pain.  Genitourinary:  Negative for dysuria.  Objective:  BP 134/60   Pulse 77   Temp 97.8 F (36.6 C) (Temporal)   Ht 5' (1.524 m)   Wt 163 lb (73.9 kg)   SpO2 98%   BMI 31.83 kg/m   Wt Readings from Last 3 Encounters:  12/03/20 163 lb (73.9 kg)  11/29/20 155 lb (70.3 kg)  11/28/20 160 lb (72.6 kg)      Physical Exam Constitutional:      General: She is not in acute distress.    Appearance: Normal appearance. She is well-developed. She is obese. She is not ill-appearing or toxic-appearing.  HENT:     Head: Normocephalic.     Right Ear: Hearing, tympanic membrane, ear canal and external ear normal. Tympanic membrane is not erythematous, retracted or bulging.     Left Ear: Hearing, tympanic membrane, ear canal and external ear normal. Tympanic membrane is not erythematous, retracted or bulging.     Nose: No mucosal edema or rhinorrhea.     Right Sinus: No maxillary sinus tenderness or frontal sinus tenderness.     Left Sinus: No maxillary sinus tenderness or frontal sinus tenderness.     Mouth/Throat:     Pharynx: Uvula midline.  Eyes:     General: Lids are normal. Lids are everted, no foreign bodies appreciated.     Conjunctiva/sclera: Conjunctivae normal.     Pupils: Pupils are equal, round, and reactive to light.  Neck:     Thyroid: No thyroid mass or thyromegaly.     Vascular: No carotid bruit.     Trachea: Trachea normal.  Cardiovascular:     Rate and Rhythm: Normal rate and regular rhythm.     Pulses: Normal pulses.     Heart sounds: Normal heart sounds, S1 normal and S2 normal. No murmur heard.   No friction rub. No gallop.  Pulmonary:     Effort: Pulmonary effort is normal. No tachypnea or respiratory distress.     Breath sounds: Normal breath sounds. No decreased breath  sounds, wheezing, rhonchi or rales.  Abdominal:     General: Bowel sounds are normal.     Palpations: Abdomen is soft.     Tenderness: There is no abdominal tenderness.  Musculoskeletal:     Cervical back: Normal range of motion and neck supple.  Skin:    General: Skin is warm and dry.     Findings: No rash.  Neurological:     Mental Status: She is alert.  Psychiatric:        Mood and Affect: Mood is not anxious or depressed.  Speech: Speech normal.        Behavior: Behavior normal. Behavior is cooperative.        Thought Content: Thought content normal.        Judgment: Judgment normal.      Results for orders placed or performed during the hospital encounter of 11/29/20  Lipase, blood  Result Value Ref Range   Lipase 21 11 - 51 U/L  Comprehensive metabolic panel  Result Value Ref Range   Sodium 129 (L) 135 - 145 mmol/L   Potassium 3.5 3.5 - 5.1 mmol/L   Chloride 95 (L) 98 - 111 mmol/L   CO2 23 22 - 32 mmol/L   Glucose, Bld 132 (H) 70 - 99 mg/dL   BUN 16 8 - 23 mg/dL   Creatinine, Ser 0.52 0.44 - 1.00 mg/dL   Calcium 8.9 8.9 - 10.3 mg/dL   Total Protein 7.4 6.5 - 8.1 g/dL   Albumin 3.7 3.5 - 5.0 g/dL   AST 19 15 - 41 U/L   ALT 23 0 - 44 U/L   Alkaline Phosphatase 83 38 - 126 U/L   Total Bilirubin 0.9 0.3 - 1.2 mg/dL   GFR, Estimated >60 >60 mL/min   Anion gap 11 5 - 15  CBC  Result Value Ref Range   WBC 9.9 4.0 - 10.5 K/uL   RBC 5.51 (H) 3.87 - 5.11 MIL/uL   Hemoglobin 16.1 (H) 12.0 - 15.0 g/dL   HCT 46.4 (H) 36.0 - 46.0 %   MCV 84.2 80.0 - 100.0 fL   MCH 29.2 26.0 - 34.0 pg   MCHC 34.7 30.0 - 36.0 g/dL   RDW 14.6 11.5 - 15.5 %   Platelets 324 150 - 400 K/uL   nRBC 0.0 0.0 - 0.2 %  Urinalysis, Complete w Microscopic Urine, Clean Catch  Result Value Ref Range   Color, Urine YELLOW (A) YELLOW   APPearance HAZY (A) CLEAR   Specific Gravity, Urine >1.046 (H) 1.005 - 1.030   pH 6.0 5.0 - 8.0   Glucose, UA NEGATIVE NEGATIVE mg/dL   Hgb urine dipstick  MODERATE (A) NEGATIVE   Bilirubin Urine NEGATIVE NEGATIVE   Ketones, ur 20 (A) NEGATIVE mg/dL   Protein, ur NEGATIVE NEGATIVE mg/dL   Nitrite NEGATIVE NEGATIVE   Leukocytes,Ua NEGATIVE NEGATIVE   RBC / HPF 6-10 0 - 5 RBC/hpf   WBC, UA 0-5 0 - 5 WBC/hpf   Bacteria, UA NONE SEEN NONE SEEN   Squamous Epithelial / LPF 0-5 0 - 5   Mucus PRESENT   Troponin I (High Sensitivity)  Result Value Ref Range   Troponin I (High Sensitivity) 5 <18 ng/L  Troponin I (High Sensitivity)  Result Value Ref Range   Troponin I (High Sensitivity) 6 <18 ng/L    This visit occurred during the SARS-CoV-2 public health emergency.  Safety protocols were in place, including screening questions prior to the visit, additional usage of staff PPE, and extensive cleaning of exam room while observing appropriate contact time as indicated for disinfecting solutions.   COVID 19 screen:  No recent travel or known exposure to COVID19 The patient denies respiratory symptoms of COVID 19 at this time. The importance of social distancing was discussed today.   Assessment and Plan    Problem List Items Addressed This Visit     Abnormal urine color    UA clear, no S/S of infeciton... likely due to hyperconcentration.      Relevant Orders   POCT Urinalysis Dipstick (  Automated) (Completed)   Dental caries    Pt has upcoming dental visit.      Hyponatremia    Limit water and increase sodium intake.   Due for re-eval.      Relevant Orders   Basic metabolic panel (Completed)   Nausea - Primary    Chronic issue for her.. followed by GI.      Right facial swelling     [name], MD

## 2020-12-03 NOTE — Patient Instructions (Addendum)
Please stop at the lab to have labs drawn. Keep up water intake. Keep dental appt.   I will look into radiation testing.

## 2020-12-03 NOTE — Telephone Encounter (Signed)
Opened in error

## 2021-01-08 ENCOUNTER — Observation Stay: Payer: Medicare Other

## 2021-01-08 ENCOUNTER — Encounter: Payer: Self-pay | Admitting: Internal Medicine

## 2021-01-08 ENCOUNTER — Emergency Department: Payer: Medicare Other

## 2021-01-08 ENCOUNTER — Other Ambulatory Visit: Payer: Self-pay

## 2021-01-08 ENCOUNTER — Inpatient Hospital Stay
Admission: EM | Admit: 2021-01-08 | Discharge: 2021-01-12 | DRG: 178 | Disposition: A | Discharge status: Home / Self Care | Payer: Medicare Other | Attending: Internal Medicine | Admitting: Internal Medicine

## 2021-01-08 ENCOUNTER — Telehealth: Payer: Self-pay

## 2021-01-08 DIAGNOSIS — J302 Other seasonal allergic rhinitis: Secondary | ICD-10-CM | POA: Diagnosis present

## 2021-01-08 DIAGNOSIS — Z808 Family history of malignant neoplasm of other organs or systems: Secondary | ICD-10-CM

## 2021-01-08 DIAGNOSIS — U071 COVID-19: Secondary | ICD-10-CM | POA: Diagnosis not present

## 2021-01-08 DIAGNOSIS — I10 Essential (primary) hypertension: Secondary | ICD-10-CM | POA: Diagnosis present

## 2021-01-08 DIAGNOSIS — R112 Nausea with vomiting, unspecified: Secondary | ICD-10-CM | POA: Diagnosis present

## 2021-01-08 DIAGNOSIS — R059 Cough, unspecified: Secondary | ICD-10-CM

## 2021-01-08 DIAGNOSIS — R519 Headache, unspecified: Secondary | ICD-10-CM | POA: Diagnosis not present

## 2021-01-08 DIAGNOSIS — Z8041 Family history of malignant neoplasm of ovary: Secondary | ICD-10-CM

## 2021-01-08 DIAGNOSIS — E871 Hypo-osmolality and hyponatremia: Secondary | ICD-10-CM | POA: Diagnosis present

## 2021-01-08 DIAGNOSIS — K529 Noninfective gastroenteritis and colitis, unspecified: Secondary | ICD-10-CM

## 2021-01-08 DIAGNOSIS — R0602 Shortness of breath: Secondary | ICD-10-CM

## 2021-01-08 DIAGNOSIS — Z8673 Personal history of transient ischemic attack (TIA), and cerebral infarction without residual deficits: Secondary | ICD-10-CM

## 2021-01-08 DIAGNOSIS — F321 Major depressive disorder, single episode, moderate: Secondary | ICD-10-CM | POA: Diagnosis not present

## 2021-01-08 DIAGNOSIS — A0839 Other viral enteritis: Secondary | ICD-10-CM | POA: Diagnosis present

## 2021-01-08 DIAGNOSIS — Z28311 Partially vaccinated for covid-19: Secondary | ICD-10-CM

## 2021-01-08 DIAGNOSIS — M549 Dorsalgia, unspecified: Secondary | ICD-10-CM | POA: Diagnosis present

## 2021-01-08 DIAGNOSIS — K580 Irritable bowel syndrome with diarrhea: Secondary | ICD-10-CM | POA: Diagnosis present

## 2021-01-08 DIAGNOSIS — K219 Gastro-esophageal reflux disease without esophagitis: Secondary | ICD-10-CM | POA: Diagnosis present

## 2021-01-08 DIAGNOSIS — E039 Hypothyroidism, unspecified: Secondary | ICD-10-CM | POA: Diagnosis present

## 2021-01-08 DIAGNOSIS — Z8249 Family history of ischemic heart disease and other diseases of the circulatory system: Secondary | ICD-10-CM

## 2021-01-08 DIAGNOSIS — R1110 Vomiting, unspecified: Secondary | ICD-10-CM | POA: Diagnosis not present

## 2021-01-08 DIAGNOSIS — E876 Hypokalemia: Secondary | ICD-10-CM | POA: Diagnosis present

## 2021-01-08 DIAGNOSIS — Z800 Family history of malignant neoplasm of digestive organs: Secondary | ICD-10-CM

## 2021-01-08 DIAGNOSIS — F411 Generalized anxiety disorder: Secondary | ICD-10-CM | POA: Diagnosis present

## 2021-01-08 DIAGNOSIS — F17210 Nicotine dependence, cigarettes, uncomplicated: Secondary | ICD-10-CM | POA: Diagnosis present

## 2021-01-08 DIAGNOSIS — Z801 Family history of malignant neoplasm of trachea, bronchus and lung: Secondary | ICD-10-CM

## 2021-01-08 DIAGNOSIS — G8929 Other chronic pain: Secondary | ICD-10-CM | POA: Diagnosis present

## 2021-01-08 DIAGNOSIS — J309 Allergic rhinitis, unspecified: Secondary | ICD-10-CM | POA: Diagnosis present

## 2021-01-08 DIAGNOSIS — E861 Hypovolemia: Secondary | ICD-10-CM | POA: Diagnosis present

## 2021-01-08 DIAGNOSIS — I2510 Atherosclerotic heart disease of native coronary artery without angina pectoris: Secondary | ICD-10-CM | POA: Diagnosis present

## 2021-01-08 DIAGNOSIS — E860 Dehydration: Secondary | ICD-10-CM | POA: Diagnosis not present

## 2021-01-08 LAB — COMPREHENSIVE METABOLIC PANEL
ALT: 19 U/L (ref 0–44)
AST: 18 U/L (ref 15–41)
Albumin: 4 g/dL (ref 3.5–5.0)
Alkaline Phosphatase: 96 U/L (ref 38–126)
Anion gap: 9 (ref 5–15)
BUN: 12 mg/dL (ref 8–23)
CO2: 22 mmol/L (ref 22–32)
Calcium: 8.9 mg/dL (ref 8.9–10.3)
Chloride: 96 mmol/L — ABNORMAL LOW (ref 98–111)
Creatinine, Ser: 0.57 mg/dL (ref 0.44–1.00)
GFR, Estimated: >60 mL/min (ref >60)
Glucose, Bld: 117 mg/dL — ABNORMAL HIGH (ref 70–99)
Potassium: 3.8 mmol/L (ref 3.5–5.1)
Sodium: 127 mmol/L — ABNORMAL LOW (ref 135–145)
Total Bilirubin: 0.7 mg/dL (ref 0.3–1.2)
Total Protein: 7.1 g/dL (ref 6.5–8.1)

## 2021-01-08 LAB — CBC
HCT: 42.6 % (ref 36.0–46.0)
Hemoglobin: 15 g/dL (ref 12.0–15.0)
MCH: 29.9 pg (ref 26.0–34.0)
MCHC: 35.2 g/dL (ref 30.0–36.0)
MCV: 85 fL (ref 80.0–100.0)
Platelets: 295 10*3/uL (ref 150–400)
RBC: 5.01 MIL/uL (ref 3.87–5.11)
RDW: 14.6 % (ref 11.5–15.5)
WBC: 5 10*3/uL (ref 4.0–10.5)
nRBC: 0 % (ref 0.0–0.2)

## 2021-01-08 LAB — RESP PANEL BY RT-PCR (FLU A&B, COVID) ARPGX2
Influenza A by PCR: NEGATIVE
Influenza B by PCR: NEGATIVE
SARS Coronavirus 2 by RT PCR: POSITIVE — AB

## 2021-01-08 LAB — LIPASE, BLOOD: Lipase: 28 U/L (ref 11–51)

## 2021-01-08 LAB — PROCALCITONIN: Procalcitonin: <0.1 ng/mL

## 2021-01-08 MED ORDER — ACETAMINOPHEN 650 MG RE SUPP: 650.0000 mg | Freq: Four times a day (QID) | RECTAL | Status: DC | PRN

## 2021-01-08 MED ORDER — SODIUM CHLORIDE 0.9 % IV SOLN: INTRAVENOUS | Status: DC

## 2021-01-08 MED ORDER — LABETALOL HCL 5 MG/ML IV SOLN
5.0000 mg | INTRAVENOUS | Status: DC | PRN
  Administered 2021-01-09: 5 mg via INTRAVENOUS
  Filled 2021-01-08: qty 4

## 2021-01-08 MED ORDER — ONDANSETRON HCL 4 MG/2ML IJ SOLN
4.0000 mg | Freq: Four times a day (QID) | INTRAMUSCULAR | Status: DC | PRN
  Administered 2021-01-09 – 2021-01-11 (×3): 4 mg via INTRAVENOUS
  Filled 2021-01-08 (×6): qty 2

## 2021-01-08 MED ORDER — SODIUM CHLORIDE 0.9 % IV SOLN: INTRAVENOUS | Status: AC

## 2021-01-08 MED ORDER — THYROID 60 MG PO TABS
90.0000 mg | ORAL_TABLET | Freq: Every morning | ORAL | Status: DC
  Administered 2021-01-09 – 2021-01-12 (×4): 90 mg via ORAL
  Filled 2021-01-08 (×5): qty 1

## 2021-01-08 MED ORDER — ONDANSETRON HCL 4 MG PO TABS: 4.0000 mg | ORAL_TABLET | Freq: Four times a day (QID) | ORAL | Status: DC | PRN

## 2021-01-08 MED ORDER — LIDOCAINE VISCOUS HCL 2 % MT SOLN
15.0000 mL | Freq: Once | OROMUCOSAL | Status: AC
  Administered 2021-01-08: 15 mL via ORAL
  Filled 2021-01-08: qty 15

## 2021-01-08 MED ORDER — ASPIRIN-DIPYRIDAMOLE ER 25-200 MG PO CP12
1.0000 | ORAL_CAPSULE | Freq: Two times a day (BID) | ORAL | Status: DC
  Administered 2021-01-09 – 2021-01-12 (×8): 1 via ORAL
  Filled 2021-01-08 (×10): qty 1

## 2021-01-08 MED ORDER — SODIUM CHLORIDE 0.9 % IV SOLN: 200.0000 mg | Freq: Once | INTRAVENOUS | Status: DC

## 2021-01-08 MED ORDER — DILTIAZEM HCL 25 MG/5ML IV SOLN
10.0000 mg | INTRAVENOUS | Status: DC | PRN
Start: 2021-01-08 — End: 2021-01-12
  Filled 2021-01-08: qty 5

## 2021-01-08 MED ORDER — ALBUTEROL SULFATE HFA 108 (90 BASE) MCG/ACT IN AERS
2.0000 | INHALATION_SPRAY | Freq: Four times a day (QID) | RESPIRATORY_TRACT | Status: DC
  Administered 2021-01-09 – 2021-01-12 (×12): 2 via RESPIRATORY_TRACT
  Filled 2021-01-08: qty 6.7

## 2021-01-08 MED ORDER — SODIUM CHLORIDE 0.9 % IV SOLN
100.0000 mg | Freq: Every day | INTRAVENOUS | Status: DC
  Administered 2021-01-09: 100 mg via INTRAVENOUS
  Filled 2021-01-08 (×2): qty 20

## 2021-01-08 MED ORDER — SODIUM CHLORIDE 0.9 % IV SOLN
12.5000 mg | Freq: Four times a day (QID) | INTRAVENOUS | Status: DC | PRN
  Administered 2021-01-08: 12.5 mg via INTRAVENOUS
  Filled 2021-01-08: qty 12.5
  Filled 2021-01-08: qty 0.5

## 2021-01-08 MED ORDER — DICYCLOMINE HCL 10 MG/ML IM SOLN
20.0000 mg | Freq: Once | INTRAMUSCULAR | Status: AC
  Administered 2021-01-08: 20 mg via INTRAMUSCULAR
  Filled 2021-01-08: qty 2

## 2021-01-08 MED ORDER — SODIUM CHLORIDE 0.9 % IV SOLN: Freq: Once | INTRAVENOUS | Status: DC

## 2021-01-08 MED ORDER — SODIUM CHLORIDE 0.9 % IV SOLN
12.5000 mg | Freq: Four times a day (QID) | INTRAVENOUS | Status: AC | PRN
  Administered 2021-01-09 – 2021-01-10 (×2): 12.5 mg via INTRAVENOUS
  Filled 2021-01-08: qty 0.5
  Filled 2021-01-08 (×2): qty 12.5

## 2021-01-08 MED ORDER — ACETAMINOPHEN 325 MG PO TABS
650.0000 mg | ORAL_TABLET | Freq: Four times a day (QID) | ORAL | Status: DC | PRN
  Administered 2021-01-09 – 2021-01-10 (×7): 650 mg via ORAL
  Filled 2021-01-08 (×7): qty 2

## 2021-01-08 MED ORDER — ALUM & MAG HYDROXIDE-SIMETH 200-200-20 MG/5ML PO SUSP
30.0000 mL | Freq: Once | ORAL | Status: AC
  Administered 2021-01-08: 30 mL via ORAL
  Filled 2021-01-08: qty 30

## 2021-01-08 MED ORDER — IOHEXOL 350 MG/ML SOLN
100.0000 mL | Freq: Once | INTRAVENOUS | Status: AC | PRN
  Administered 2021-01-08: 100 mL via INTRAVENOUS
  Filled 2021-01-08: qty 100

## 2021-01-08 MED ORDER — SODIUM CHLORIDE 0.9 % IV SOLN
100.0000 mg | Freq: Once | INTRAVENOUS | Status: AC
  Administered 2021-01-09: 100 mg via INTRAVENOUS
  Filled 2021-01-08: qty 20

## 2021-01-08 MED ORDER — REMDESIVIR 100 MG IV SOLR: 100.0000 mg | Freq: Every day | INTRAVENOUS | Status: DC

## 2021-01-08 MED ORDER — SODIUM CHLORIDE 0.9 % IV SOLN
100.0000 mg | Freq: Once | INTRAVENOUS | Status: AC
  Administered 2021-01-09: 100 mg via INTRAVENOUS
  Filled 2021-01-08: qty 100

## 2021-01-08 MED ORDER — GUAIFENESIN-DM 100-10 MG/5ML PO SYRP
10.0000 mL | ORAL_SOLUTION | ORAL | Status: DC | PRN
  Administered 2021-01-11 – 2021-01-12 (×3): 10 mL via ORAL
  Filled 2021-01-08 (×2): qty 10

## 2021-01-08 MED ORDER — ENOXAPARIN SODIUM 40 MG/0.4ML IJ SOSY
40.0000 mg | PREFILLED_SYRINGE | INTRAMUSCULAR | Status: DC
  Administered 2021-01-09 – 2021-01-11 (×4): 40 mg via SUBCUTANEOUS
  Filled 2021-01-08 (×4): qty 0.4

## 2021-01-08 MED ORDER — SODIUM CHLORIDE 0.9 % IV BOLUS
1000.0000 mL | Freq: Once | INTRAVENOUS | Status: AC
  Administered 2021-01-08: 1000 mL via INTRAVENOUS

## 2021-01-08 MED ORDER — ONDANSETRON HCL 4 MG/2ML IJ SOLN
4.0000 mg | Freq: Once | INTRAMUSCULAR | Status: AC
  Administered 2021-01-08: 4 mg via INTRAVENOUS
  Filled 2021-01-08: qty 2

## 2021-01-08 MED ORDER — FLUTICASONE PROPIONATE 50 MCG/ACT NA SUSP
1.0000 | Freq: Every day | NASAL | Status: DC | PRN
Start: 2021-01-08 — End: 2021-01-12
  Filled 2021-01-08: qty 16

## 2021-01-08 NOTE — Telephone Encounter (Signed)
Patient is unable to do virtual.  Can only do phone visit.  I advised Rollene Fare to recommend Urgent Care for in person visit.  Let me know if you are okay doing just a phone visit or agree with Urgent Care?

## 2021-01-08 NOTE — ED Triage Notes (Addendum)
Pt here with dehydration and vomiting. Pt also states she has diarrhea. PT was here twice recently for the same. Pt also having headache and abd pain.

## 2021-01-08 NOTE — Telephone Encounter (Signed)
Spoke to patient and was advised that she has been throwing up since 9:00 pm last night. Patient stated that she did take a Zofan this morning around 8:00 and has thrown up once since. Patient stated that she does have a cough, headache, chills, vomiting and diarrhea. Patient stated that she has a history of dehydration but not feel that she is at that point right now. Patient stated that she does not have any home covid test.

## 2021-01-08 NOTE — ED Provider Notes (Signed)
[facility] Emergency Department Provider Note  ____________________________________________   Event Date/Time   First MD Initiated Contact with Patient 01/08/21 1731     (approximate)  I have reviewed the triage vital signs and the nursing notes.   HISTORY  Chief Complaint Dehydration and Nausea    HPI Tiffany Orozco is a 70 y.o. female with past medical history as below including history of IBS and recurrent nausea and vomiting here with nausea, vomiting, diarrhea.  The patient states that for the last 2 days or so, she has had progressively worsening generalized abdominal pain, nausea, vomiting, and loose, watery diarrhea.  She has a history of recurrent episodes of this and has been seen by GI.  Denies recent sick contacts.  No suspicious food intake.  She states the pain is 10 out of 10, crampy, and intermittent.  She has also had loose stools and vomiting and has essentially been unable to eat or drink.  She states the pain is worse with any attempted eating or palpation.  No alleviating factors.    Past Medical History:  Diagnosis Date   Allergy    Anxiety    COPD (chronic obstructive pulmonary disease) ([place])    DDD (degenerative disc disease), lumbar    mild   Depression    Female rectocele without uterine prolapse    GERD (gastroesophageal reflux disease)    Hearing loss    Heart murmur    Hyperlipidemia    Hypertension    Hypothyroidism    IBS (irritable bowel syndrome)    Influenza A 06/2015   Obesity    Osteopenia    Plantar fasciitis    Pneumonia    Skin cancer of arm    Skin cancer of face    Stroke ([place]) 07/2010   no permenant deficits   Thrombocytopenia ([place])    Tubal pregnancy     Patient Active Problem List   Diagnosis Date Noted   Right facial swelling 11/27/2020   Diarrhea 08/10/2020   Status post repair of ventral hernia [id number]   Umbilical hernia without obstruction and without gangrene    Acoustic trauma  (explosive) to ear, bilateral 04/03/2019   Esophagitis 09/02/2016   PUD (peptic ulcer disease) 09/02/2016   Coronary atherosclerosis of native coronary artery 05/04/2016   Tobacco abuse 06/09/2015   History of CVA (cerebrovascular accident) 06/08/2015   IBS (irritable bowel syndrome) 06/08/2015   Nausea 01/17/2015   Chronic back pain 07/12/2014   Dysphagia 04/19/2014   Fatigue 09/12/2012   HTN (hypertension) 11/30/2011   Herniation of rectum into vagina 08/29/2011   Hyponatremia 03/26/2011   Dyspnea 03/23/2011   CONSTIPATION, CHRONIC 03/19/2010   Osteopenia 03/19/2010   Eustachian tube dysfunction 10/21/2009   MURMUR 03/20/2009   UNSPECIFIED CARDIAC DYSRHYTHMIA 03/06/2009   Allergic rhinitis 08/14/2007   Hypothyroidism 02/27/2007   HYPERCHOLESTEROLEMIA 02/27/2007   Generalized anxiety disorder 02/27/2007   Depression, major, single episode, moderate ([place]) 02/27/2007   GERD 02/27/2007    Past Surgical History:  Procedure Laterality Date   APPENDECTOMY     CHOLECYSTECTOMY  2004   COLONOSCOPY     CYST EXCISION Right    breast mole   EMGs  7/04   atms and wrists neg   ESOPHAGEAL MANOMETRY N/A 03/17/2015   Procedure: ESOPHAGEAL MANOMETRY (EM);  Surgeon: Manus Gunning, MD;  Location: [facility];  Service: Gastroenterology;  Laterality: N/A;   FOOT SURGERY Right 2008, 2009, 2010   torn ligament, hammertoes  TUBAL LIGATION  1975   UPPER GI ENDOSCOPY     XI ROBOTIC ASSISTED VENTRAL HERNIA N/A 04/14/2020   Procedure: XI ROBOTIC ASSISTED VENTRAL HERNIA;  Surgeon: Fredirick Maudlin, MD;  Location: [facility];  Service: General;  Laterality: N/A;    Prior to Admission medications   Medication Sig Start Date End Date Taking? Authorizing Provider  amoxicillin-clavulanate (AUGMENTIN) 875-125 MG tablet Take 1 tablet by mouth 2 (two) times daily. 11/27/20   Pleas Koch, NP  ARMOUR THYROID 90 MG tablet Take one tablet by mouth daily. 02/29/20   Bedsole, Amy E, MD   Cholecalciferol (VITAMIN D3) 125 MCG (5000 UT) TABS Take 5,000 Units by mouth daily.    [provider]  dipyridamole-aspirin (AGGRENOX) 200-25 MG 12hr capsule Take 1 capsule by mouth 2 (two) times daily. 02/29/20   Bedsole, Amy E, MD  fluticasone (FLONASE) 50 MCG/ACT nasal spray Place 1-2 sprays into both nostrils daily as needed for allergies or rhinitis.    [provider]  ondansetron (ZOFRAN ODT) 4 MG disintegrating tablet Take 1 tablet (4 mg total) by mouth every 8 (eight) hours as needed for nausea or vomiting. 11/29/20   Merlyn Lot, MD  ondansetron (ZOFRAN) 8 MG tablet TAKE 1 TABLET BY MOUTH EVERY 8 HOURS AS NEEDED FOR NAUSEA OR VOMITING. 07/08/20   Diona Browner, Amy E, MD    Allergies Lisinopril, Niacin, Welchol [colesevelam hcl], Augmentin [amoxicillin-pot clavulanate], Codeine, and Oxycodone hcl  Family History  Problem Relation Age of Onset   Skin cancer Father    Hypertension Mother    Lung cancer Brother    Colon cancer Sister    Esophageal cancer Sister    Ovarian cancer Sister    Stomach cancer Neg Hx    Pancreatic cancer Neg Hx     Social History Social History   Tobacco Use   Smoking status: Every Day    Packs/day: 0.75    Years: 31.00    Pack years: 23.25    Types: Cigarettes   Smokeless tobacco: Never   Tobacco comments:    States she is not smoking during current illness 05/29/18 sb  Vaping Use   Vaping Use: Never used  Substance Use Topics   Alcohol use: No    Alcohol/week: 0.0 standard drinks   Drug use: No    Review of Systems  Review of Systems  Constitutional:  Negative for fatigue and fever.  HENT:  Negative for congestion and sore throat.   Eyes:  Negative for visual disturbance.  Respiratory:  Negative for cough and shortness of breath.   Cardiovascular:  Negative for chest pain.  Gastrointestinal:  Positive for abdominal pain, diarrhea, nausea and vomiting.  Genitourinary:  Negative for flank pain.  Musculoskeletal:   Negative for back pain and neck pain.  Skin:  Negative for rash and wound.  Neurological:  Positive for weakness.  All other systems reviewed and are negative.   ____________________________________________  PHYSICAL EXAM:      VITAL SIGNS: ED Triage Vitals  Enc Vitals Group     BP 01/08/21 1434 (!) 163/86     Pulse Rate 01/08/21 1434 84     Resp 01/08/21 1434 18     Temp 01/08/21 1434 98.3 F (36.8 C)     Temp Source 01/08/21 1434 Oral     SpO2 01/08/21 1434 99 %     Weight 01/08/21 1435 156 lb (70.8 kg)     Height 01/08/21 1435 '5\' 5"'$  (1.651 m)  Head Circumference --      Peak Flow --      Pain Score 01/08/21 1435 9     Pain Loc --      Pain Edu? --      Excl. in [place]? --      Physical Exam Vitals and nursing note reviewed.  Constitutional:      General: She is not in acute distress.    Appearance: She is well-developed.  HENT:     Head: Normocephalic and atraumatic.     Comments: Dry mucous membranes Eyes:     Conjunctiva/sclera: Conjunctivae normal.  Cardiovascular:     Rate and Rhythm: Normal rate and regular rhythm.     Heart sounds: Normal heart sounds. No murmur heard.   No friction rub.  Pulmonary:     Effort: Pulmonary effort is normal. No respiratory distress.     Breath sounds: Normal breath sounds. No wheezing or rales.  Abdominal:     General: Bowel sounds are increased. There is no distension.     Palpations: Abdomen is soft.     Tenderness: There is generalized abdominal tenderness.  Musculoskeletal:     Cervical back: Neck supple.  Skin:    General: Skin is warm.     Capillary Refill: Capillary refill takes less than 2 seconds.  Neurological:     Mental Status: She is alert and oriented to person, place, and time.     Motor: No abnormal muscle tone.      ____________________________________________   LABS (all labs ordered are listed, but only abnormal results are displayed)  Labs Reviewed  COMPREHENSIVE METABOLIC PANEL - Abnormal;  Notable for the following components:      Result Value   Sodium 127 (*)    Chloride 96 (*)    Glucose, Bld 117 (*)    All other components within normal limits  RESP PANEL BY RT-PCR (FLU A&B, COVID) ARPGX2  LIPASE, BLOOD  CBC  URINALYSIS, COMPLETE (UACMP) WITH MICROSCOPIC    ____________________________________________  EKG:  ________________________________________  RADIOLOGY All imaging, including plain films, CT scans, and ultrasounds, independently reviewed by me, and interpretations confirmed via formal radiology reads.  ED MD interpretation:   CT head: Negative CT abdomen/pelvis: Scattered fluid-filled, nondistended small bowel  Official radiology report(s): CT HEAD WO CONTRAST (5MM)  Result Date: 01/08/2021 CLINICAL DATA:  Headache EXAM: CT HEAD WITHOUT CONTRAST TECHNIQUE: Contiguous axial images were obtained from the base of the skull through the vertex without intravenous contrast. COMPARISON:  MRI 01/25/2015, CT brain 01/25/2015 FINDINGS: Brain: No acute territorial infarction, hemorrhage or intracranial mass. Mild atrophy. Nonenlarged ventricles Vascular: No hyperdense vessels.  Carotid vascular calcification Skull: Normal. Negative for fracture or focal lesion. Sinuses/Orbits: No acute finding. Other: None IMPRESSION: No CT evidence for acute intracranial abnormality.  Mild atrophy Electronically Signed   By: Donavan Foil M.D.   On: 01/08/2021 18:52   CT ABDOMEN PELVIS W CONTRAST  Result Date: 01/08/2021 CLINICAL DATA:  Abdomen pain with vomiting and diarrhea EXAM: CT ABDOMEN AND PELVIS WITH CONTRAST TECHNIQUE: Multidetector CT imaging of the abdomen and pelvis was performed using the standard protocol following bolus administration of intravenous contrast. CONTRAST:  183m OMNIPAQUE IOHEXOL 350 MG/ML SOLN COMPARISON:  CT 11/29/2020, 04/20/2020 FINDINGS: Lower chest: Lung bases demonstrate no acute consolidation or effusion. Borderline cardiomegaly. Hepatobiliary: No  focal liver abnormality is seen. Status post cholecystectomy. No biliary dilatation. Pancreas: Unremarkable. No pancreatic ductal dilatation or surrounding inflammatory changes. Spleen: Normal in size without  focal abnormality. Adrenals/Urinary Tract: Adrenal glands are unremarkable. Kidneys are normal, without renal calculi, focal lesion, or hydronephrosis. Bladder is unremarkable. Stomach/Bowel: Mild fluid distension of the stomach. Slight circumferential thickening of the second portion of duodenum without inflammatory change. Scattered fluid-filled nondistended small bowel. No acute bowel wall thickening. Vascular/Lymphatic: Moderate aortic atherosclerosis. No aneurysm. No suspicious adenopathy Reproductive: Uterus and bilateral adnexa are unremarkable. Other: Negative for pelvic effusion or free air. Small fat density lesion in the ventral soft tissues without change and likely related to prior ventral hernia repair. Musculoskeletal: Scoliosis and degenerative changes. No acute osseous abnormality IMPRESSION: 1. Scattered fluid-filled nondistended small bowel could be due to ileus or enteritis. No acute bowel wall thickening. No evidence for bowel obstruction at this time. Electronically Signed   By: Donavan Foil M.D.   On: 01/08/2021 18:50    ____________________________________________  PROCEDURES   Procedure(s) performed (including Critical Care):  Procedures  ____________________________________________  INITIAL IMPRESSION / MDM / [place] / ED COURSE  As part of my medical decision making, I reviewed the following data within the [place] notes reviewed and incorporated, Old chart reviewed, Notes from prior ED visits, and [place] Controlled Substance [place] was evaluated in Emergency Department on 01/08/2021 for the symptoms described in the history of present illness. She was evaluated in the context of the global COVID-19  pandemic, which necessitated consideration that the patient might be at risk for infection with the SARS-CoV-2 virus that causes COVID-19. Institutional protocols and algorithms that pertain to the evaluation of patients at risk for COVID-19 are in a state of rapid change based on information released by regulatory bodies including the CDC and federal and state organizations. These policies and algorithms were followed during the patient's care in the ED.  Some ED evaluations and interventions may be delayed as a result of limited staffing during the pandemic.*     Medical Decision Making: 70 year old female here with nausea, vomiting, diarrhea.  Patient appears uncomfortable and dehydrated clinically.  Lab work shows hyponatremia which I suspect is due to her hypokalemia nausea vomiting.  CBC unremarkable.  CT scan shows diffuse fluid-filled bowel consistent with likely enteritis.  Patient has ongoing nausea and vomiting despite receiving multiple antiemetics.  Will admit for fluids and symptomatic management.  Patient updated and in agreement.  ____________________________________________  FINAL CLINICAL IMPRESSION(S) / ED DIAGNOSES  Final diagnoses:  Enteritis  Hyponatremia     MEDICATIONS GIVEN DURING THIS VISIT:  Medications  promethazine (PHENERGAN) 12.5 mg in sodium chloride 0.9 % 50 mL IVPB (12.5 mg Intravenous New Bag/Given 01/08/21 1835)  0.9 %  sodium chloride infusion (has no administration in time range)  alum & mag hydroxide-simeth (MAALOX/MYLANTA) 200-200-20 MG/5ML suspension 30 mL (has no administration in time range)    And  lidocaine (XYLOCAINE) 2 % viscous mouth solution 15 mL (has no administration in time range)  dicyclomine (BENTYL) injection 20 mg (has no administration in time range)  ondansetron (ZOFRAN) injection 4 mg (4 mg Intravenous Given 01/08/21 1451)  sodium chloride 0.9 % bolus 1,000 mL (0 mLs Intravenous Stopped 01/08/21 1721)  iohexol (OMNIPAQUE) 350 MG/ML  injection 100 mL (100 mLs Intravenous Contrast Given 01/08/21 1807)     ED Discharge Orders     None        Note:  This document was prepared using Dragon voice recognition software and may include unintentional dictation errors.   Duffy Bruce,  MD 01/08/21 1936

## 2021-01-08 NOTE — Progress Notes (Signed)
Remdesivir - Pharmacy Brief Note   O:  CXR: "No evidence of acute cardiopulmonary disease." SpO2: 95-99% on RA   A/P:  Remdesivir 200 mg IVPB once followed by 100 mg IVPB daily x 4 days.   Renda Rolls, PharmD, [facility] 01/08/2021 10:45 PM

## 2021-01-08 NOTE — ED Notes (Signed)
Phenergan requested from pharmacy

## 2021-01-08 NOTE — Telephone Encounter (Signed)
Reasonable.

## 2021-01-08 NOTE — Telephone Encounter (Signed)
Planned on discussing this in person with Dr. Lorelei Pont. Patient can not do a virtual visit because she does not do mychart and has a flip phone. Patient stated that she does not have a fever at this time but has been taking tylenol. Patient has a history of getting dehydrated quickly. Patient is going to [facility] to be evaluated.

## 2021-01-08 NOTE — Telephone Encounter (Signed)
I am not sure if there is a question?  She probably has Covid, and ideally can have a virtual appointment.

## 2021-01-08 NOTE — Telephone Encounter (Signed)
Vm from pt stating she has chills, HA, nausea, vomiting, diarrhea and HA.  Sxs started yesterday, worsened today.   Plz advise.  Please triage pt.

## 2021-01-08 NOTE — H&P (Addendum)
History and Physical   Tiffany Orozco QMV:[id number] DOB: Dec 30, 1950 DOA: 01/08/2021  PCP: Jinny Sanders, MD  Outpatient Specialists: Dr. Celine Ahr, general surgery  Patient coming from: home  I have personally briefly reviewed patient's old medical records in [place].  Chief Concern: Abdominal pain, nausea, vomiting  [name] is a 70 y.o. female with medical history significant for CAD, hypothyroid, seasonal allergies, presents to the emergency department at the urging of her PCP for chief concerns of intractable nausea and vomiting.  Patient has been vomiting since 9 PM on 01/07/2021.  Patient took 1 dose of Zofran at home and has vomited since. She denies changes to her diet recently. She has had nausea, vomiting and/or diarrhea about half the month each month. She usually has more diarrhea than she has vomiting. She endorsed subjective fever with a temperature of 99 F.   She states the diarrhea is dark brown, almost black. The vomitus is orange yellow and phlegm and sometimes streaks of blood. She last experienced these symptoms about one month ago and it gradually went away and she was symptom free for about 3-4 days before experiencing these symptoms.   She was given antibiotics about 1 month ago, Augmentin and one shot of IV antibiotics.   She last had carbohydrate products on Monday and dairy products 01/07/21.   Social history: She lives a lone and currently her ex-husband/legally married husband is living with her. She smokes 1 ppd, started at 70 years old. She denies etoh and recreational drug use. She is retired and formerly worked as a Therapist, occupational for companies.   Vaccination history: She is vaccinated for covid 19, two doses of Pfizer  ROS: Constitutional: no weight change, no fever ENT/Mouth: no sore throat, no rhinorrhea Eyes: no eye pain, no vision changes Cardiovascular: no chest pain, no dyspnea,  no edema, no palpitations Respiratory: + cough, no  sputum, no wheezing Gastrointestinal: + nausea, + vomiting, + diarrhea, no constipation Genitourinary: no urinary incontinence, no dysuria, no hematuria Musculoskeletal: no arthralgias, no myalgias Skin: no skin lesions, no pruritus, Neuro: + weakness, no loss of consciousness, no syncope Psych: no anxiety, no depression, + decrease appetite Heme/Lymph: no bruising, no bleeding  ED Course: Discussed with emergency medicine provider, patient requiring hospitalization for intractable nausea and vomiting and hyponatremia.  Vitals in the emergency department was remarkable for temperature 98.1, respiration rate of 18, heart rate 84, blood pressure 163/86, SPO2 of 99% on room air.  Labs in the emergency department was remarkable for sodium 127, potassium 3.8, chloride 96, bicarb 22, BUN 12, serum creatinine of 0.57, nonfasting blood glucose 117, WBC 5.0, hemoglobin 15, platelets 295, EGFR greater than 60.  COVID PCR is pending.  ED provider ordered ondansetron 4 mg IV once, Bentyl IM injection once, GI cocktail with Maalox and lidocaine solution.  EDP ordered normal saline 1 L bolus, normal saline IVF at 100 mL/h.  Patient received Phenergan IV 12.5 mg IV one-time dose per EDP.  CT of the head was ordered and was read as no CT evidence of acute intracranial abnormality.  Mild atrophy.  CT of the abdomen and pelvis with contrast was ordered and was read as scattered fluid-filled nondistended small bowel could be due to ileus/enteritis.  No acute bowel wall thickening.  No evidence of bowel obstruction at this time.  Assessment/Plan  Principal Problem:   Intractable vomiting with nausea Active Problems:   Hypothyroidism   Generalized anxiety disorder   Depression,  major, single episode, moderate (HCC)   Allergic rhinitis   GERD   Hyponatremia   HTN (hypertension)   Chronic back pain   Coronary atherosclerosis of native coronary artery   # Intractable nausea and vomiting I suspect this  is secondary to gastroenteritis secondary to COVID infection - Symptomatic support with IV Zofran for vomiting and IV Phenergan as needed for intractable nausea and vomiting as needed - I have advised that patient consider eliminating foods with dairy and gluten products for a minimum of two weeks. She currently does not eat red meat and/or pork due to abdominal pain. She currently eats chicken. She does not eat a lot of fruit.  - I have advise that after two weeks, she can add one product back slowly such as dairy and continue to eliminate gluten products - Patient endorsed understanding and states that she will go home and try to do elimination diet  # COVID-19 infection - IV remdesivir per pharmacy - Added procalcitonin to previous labs collected and if elevated, I would recommend a.m. team to consider initiation of antibiotics IV to cover for superimposed bacterial pneumonia infection - Incentive spirometry and flutter valve for 10 reps every 2 hours while awake - Albuterol inhaler 2 puffs every 4 hours while awake,  - Daily labs: CMP, CBC, CRP, D-dimer, ferritin - Supplemental oxygen to maintain SPO2 goal of greater than 88% - Judicious fluid management, NS 125 ml/hr to complete 1 L bag - Airborne and contact precautions  # Hyponatremia-I suspect this is secondary to GI loss - Normal saline at 125 mL/h, 8 hours ordered to complete 1 L bag - BMP in the a.m.  # Hypertension-labetalol 5 mg IV every 2 hours as needed for SBP greater than 170, 2 doses ordered, diltiazem 10 mg IV every 4 hours as needed for SBP greater than 170, 3 doses ordered  # Hypothyroid-thyroid Armour tablet 90 mg every morning at 6 AM ordered # History of CAD and CVA-resumed home Aggrenox 200-25 mg, 1 capsule per 12 hours # History of IBS-D-follow-up with outpatient GI  Chart reviewed.   DVT prophylaxis: Enoxaparin Code Status: Full code Diet: Heart healthy Family Communication: no Disposition Plan: Clinical  course Consults called: None Admission status: MedSurg, observation, no telemetry  Past Medical History:  Diagnosis Date   Allergy    Anxiety    COPD (chronic obstructive pulmonary disease) ([place])    DDD (degenerative disc disease), lumbar    mild   Depression    Female rectocele without uterine prolapse    GERD (gastroesophageal reflux disease)    Hearing loss    Heart murmur    Hyperlipidemia    Hypertension    Hypothyroidism    IBS (irritable bowel syndrome)    Influenza A 06/2015   Obesity    Osteopenia    Plantar fasciitis    Pneumonia    Skin cancer of arm    Skin cancer of face    Stroke ([place]) 07/2010   no permenant deficits   Thrombocytopenia ([place])    Tubal pregnancy    Past Surgical History:  Procedure Laterality Date   APPENDECTOMY     CHOLECYSTECTOMY  2004   COLONOSCOPY     CYST EXCISION Right    breast mole   EMGs  7/04   atms and wrists neg   ESOPHAGEAL MANOMETRY N/A 03/17/2015   Procedure: ESOPHAGEAL MANOMETRY (EM);  Surgeon: Manus Gunning, MD;  Location: [facility];  Service: Gastroenterology;  Laterality: N/A;  FOOT SURGERY Right 2008, 2009, 2010   torn ligament, hammertoes   TUBAL LIGATION  1975   UPPER GI ENDOSCOPY     XI ROBOTIC ASSISTED VENTRAL HERNIA N/A 04/14/2020   Procedure: XI ROBOTIC ASSISTED VENTRAL HERNIA;  Surgeon: Fredirick Maudlin, MD;  Location: [facility];  Service: General;  Laterality: N/A;   Social History:  reports that she has been smoking cigarettes. She has a 23.25 pack-year smoking history. She has never used smokeless tobacco. She reports that she does not drink alcohol and does not use drugs.  Allergies  Allergen Reactions   Lisinopril Shortness Of Breath   Niacin Other (See Comments)     dizziness   Welchol [Colesevelam Hcl] Diarrhea, Nausea Only, Swelling and Other (See Comments)     swelling in face   Augmentin [Amoxicillin-Pot Clavulanate] Nausea Only    GI upset with taking.   Codeine     keeps patient  awake   Oxycodone Hcl     Patient states this medication makes her hyper   Family History  Problem Relation Age of Onset   Skin cancer Father    Hypertension Mother    Lung cancer Brother    Colon cancer Sister    Esophageal cancer Sister    Ovarian cancer Sister    Stomach cancer Neg Hx    Pancreatic cancer Neg Hx    Family history: Family history reviewed and not pertinent  Prior to Admission medications   Medication Sig Start Date End Date Taking? Authorizing Provider  amoxicillin-clavulanate (AUGMENTIN) 875-125 MG tablet Take 1 tablet by mouth 2 (two) times daily. 11/27/20   Pleas Koch, NP  ARMOUR THYROID 90 MG tablet Take one tablet by mouth daily. 02/29/20   Bedsole, Danyale Ridinger E, MD  Cholecalciferol (VITAMIN D3) 125 MCG (5000 UT) TABS Take 5,000 Units by mouth daily.    [provider]  dipyridamole-aspirin (AGGRENOX) 200-25 MG 12hr capsule Take 1 capsule by mouth 2 (two) times daily. 02/29/20   Bedsole, Decorian Schuenemann E, MD  fluticasone (FLONASE) 50 MCG/ACT nasal spray Place 1-2 sprays into both nostrils daily as needed for allergies or rhinitis.    [provider]  ondansetron (ZOFRAN ODT) 4 MG disintegrating tablet Take 1 tablet (4 mg total) by mouth every 8 (eight) hours as needed for nausea or vomiting. 11/29/20   Merlyn Lot, MD  ondansetron (ZOFRAN) 8 MG tablet TAKE 1 TABLET BY MOUTH EVERY 8 HOURS AS NEEDED FOR NAUSEA OR VOMITING. 07/08/20   Jinny Sanders, MD   Physical Exam: Vitals:   01/08/21 1435 01/08/21 1721 01/08/21 1732 01/08/21 1830  BP:  (!) 201/79 (!) 188/86 (!) 158/90  Pulse:  80 75 70  Resp:  16 16 16   Temp:  98.1 F (36.7 C)    TempSrc:  Oral    SpO2:  97% 98% 99%  Weight: 70.8 kg     Height: 5' 5"  (1.651 m)      Constitutional: appears age-appropriate, NAD, calm, comfortable Eyes: PERRL, lids and conjunctivae normal ENMT: Mucous membranes are moist. Posterior pharynx clear of any exudate or lesions. Age-appropriate dentition. Hearing  appropriate Neck: normal, supple, no masses, no thyromegaly Respiratory: clear to auscultation bilaterally, no wheezing, no crackles. Normal respiratory effort. No accessory muscle use.  Cardiovascular: Regular rate and rhythm, no murmurs / rubs / gallops. No extremity edema. 2+ pedal pulses. No carotid bruits.  Abdomen: no tenderness, no masses palpated, no hepatosplenomegaly. Bowel sounds positive.  Musculoskeletal: no clubbing / cyanosis. No joint  deformity upper and lower extremities. Good ROM, no contractures, no atrophy. Normal muscle tone.  Skin: no rashes, lesions, ulcers. No induration Neurologic: Sensation intact. Strength 5/5 in all 4.  Psychiatric: Normal judgment and insight. Alert and oriented x 3. Normal mood.   EKG: Not indicated  Chest x-ray on Admission: I personally reviewed and I agree with radiologist reading as below.  CT HEAD WO CONTRAST (5MM)  Result Date: 01/08/2021 CLINICAL DATA:  Headache EXAM: CT HEAD WITHOUT CONTRAST TECHNIQUE: Contiguous axial images were obtained from the base of the skull through the vertex without intravenous contrast. COMPARISON:  MRI 01/25/2015, CT brain 01/25/2015 FINDINGS: Brain: No acute territorial infarction, hemorrhage or intracranial mass. Mild atrophy. Nonenlarged ventricles Vascular: No hyperdense vessels.  Carotid vascular calcification Skull: Normal. Negative for fracture or focal lesion. Sinuses/Orbits: No acute finding. Other: None IMPRESSION: No CT evidence for acute intracranial abnormality.  Mild atrophy Electronically Signed   By: Donavan Foil M.D.   On: 01/08/2021 18:52   CT ABDOMEN PELVIS W CONTRAST  Result Date: 01/08/2021 CLINICAL DATA:  Abdomen pain with vomiting and diarrhea EXAM: CT ABDOMEN AND PELVIS WITH CONTRAST TECHNIQUE: Multidetector CT imaging of the abdomen and pelvis was performed using the standard protocol following bolus administration of intravenous contrast. CONTRAST:  121m OMNIPAQUE IOHEXOL 350 MG/ML SOLN  COMPARISON:  CT 11/29/2020, 04/20/2020 FINDINGS: Lower chest: Lung bases demonstrate no acute consolidation or effusion. Borderline cardiomegaly. Hepatobiliary: No focal liver abnormality is seen. Status post cholecystectomy. No biliary dilatation. Pancreas: Unremarkable. No pancreatic ductal dilatation or surrounding inflammatory changes. Spleen: Normal in size without focal abnormality. Adrenals/Urinary Tract: Adrenal glands are unremarkable. Kidneys are normal, without renal calculi, focal lesion, or hydronephrosis. Bladder is unremarkable. Stomach/Bowel: Mild fluid distension of the stomach. Slight circumferential thickening of the second portion of duodenum without inflammatory change. Scattered fluid-filled nondistended small bowel. No acute bowel wall thickening. Vascular/Lymphatic: Moderate aortic atherosclerosis. No aneurysm. No suspicious adenopathy Reproductive: Uterus and bilateral adnexa are unremarkable. Other: Negative for pelvic effusion or free air. Small fat density lesion in the ventral soft tissues without change and likely related to prior ventral hernia repair. Musculoskeletal: Scoliosis and degenerative changes. No acute osseous abnormality IMPRESSION: 1. Scattered fluid-filled nondistended small bowel could be due to ileus or enteritis. No acute bowel wall thickening. No evidence for bowel obstruction at this time. Electronically Signed   By: KDonavan FoilM.D.   On: 01/08/2021 18:50    Labs on Admission: I have personally reviewed following labs  CBC: Recent Labs  Lab 01/08/21 1441  WBC 5.0  HGB 15.0  HCT 42.6  MCV 85.0  PLT 2287  Basic Metabolic Panel: Recent Labs  Lab 01/08/21 1441  NA 127*  K 3.8  CL 96*  CO2 22  GLUCOSE 117*  BUN 12  CREATININE 0.57  CALCIUM 8.9   GFR: Estimated Creatinine Clearance: 62.7 mL/min (by C-G formula based on SCr of 0.57 mg/dL).  Liver Function Tests: Recent Labs  Lab 01/08/21 1441  AST 18  ALT 19  ALKPHOS 96  BILITOT 0.7   PROT 7.1  ALBUMIN 4.0   Recent Labs  Lab 01/08/21 1441  LIPASE 28   Urine analysis:    Component Value Date/Time   COLORURINE YELLOW (A) 11/29/2020 0840   APPEARANCEUR HAZY (A) 11/29/2020 0840   LABSPEC >1.046 (H) 11/29/2020 0840   PHURINE 6.0 11/29/2020 0840   GLUCOSEU NEGATIVE 11/29/2020 0840   HGBUR MODERATE (A) 11/29/2020 0840   BILIRUBINUR Negative 12/03/2020 1157  KETONESUR 20 (A) 11/29/2020 0840   PROTEINUR Negative 12/03/2020 1157   PROTEINUR NEGATIVE 11/29/2020 0840   UROBILINOGEN 0.2 12/03/2020 1157   UROBILINOGEN 0.2 01/25/2015 1903   NITRITE Negative 12/03/2020 1157   NITRITE NEGATIVE 11/29/2020 0840   LEUKOCYTESUR Negative 12/03/2020 1157   LEUKOCYTESUR NEGATIVE 11/29/2020 0840   Dr. Tobie Poet [facility]  If 7PM-7AM, please contact overnight-coverage provider If 7AM-7PM, please contact day coverage provider [email or web address]  01/08/2021, 7:55 PM

## 2021-01-09 DIAGNOSIS — Z808 Family history of malignant neoplasm of other organs or systems: Secondary | ICD-10-CM | POA: Diagnosis not present

## 2021-01-09 DIAGNOSIS — E861 Hypovolemia: Secondary | ICD-10-CM | POA: Diagnosis present

## 2021-01-09 DIAGNOSIS — K529 Noninfective gastroenteritis and colitis, unspecified: Secondary | ICD-10-CM | POA: Diagnosis not present

## 2021-01-09 DIAGNOSIS — J302 Other seasonal allergic rhinitis: Secondary | ICD-10-CM | POA: Diagnosis present

## 2021-01-09 DIAGNOSIS — E860 Dehydration: Secondary | ICD-10-CM | POA: Diagnosis not present

## 2021-01-09 DIAGNOSIS — E871 Hypo-osmolality and hyponatremia: Secondary | ICD-10-CM | POA: Diagnosis present

## 2021-01-09 DIAGNOSIS — F321 Major depressive disorder, single episode, moderate: Secondary | ICD-10-CM | POA: Diagnosis present

## 2021-01-09 DIAGNOSIS — K219 Gastro-esophageal reflux disease without esophagitis: Secondary | ICD-10-CM | POA: Diagnosis present

## 2021-01-09 DIAGNOSIS — Z800 Family history of malignant neoplasm of digestive organs: Secondary | ICD-10-CM | POA: Diagnosis not present

## 2021-01-09 DIAGNOSIS — I2510 Atherosclerotic heart disease of native coronary artery without angina pectoris: Secondary | ICD-10-CM | POA: Diagnosis present

## 2021-01-09 DIAGNOSIS — E039 Hypothyroidism, unspecified: Secondary | ICD-10-CM | POA: Diagnosis present

## 2021-01-09 DIAGNOSIS — I10 Essential (primary) hypertension: Secondary | ICD-10-CM | POA: Diagnosis present

## 2021-01-09 DIAGNOSIS — E038 Other specified hypothyroidism: Secondary | ICD-10-CM

## 2021-01-09 DIAGNOSIS — Z801 Family history of malignant neoplasm of trachea, bronchus and lung: Secondary | ICD-10-CM | POA: Diagnosis not present

## 2021-01-09 DIAGNOSIS — U071 COVID-19: Secondary | ICD-10-CM | POA: Diagnosis present

## 2021-01-09 DIAGNOSIS — Z8249 Family history of ischemic heart disease and other diseases of the circulatory system: Secondary | ICD-10-CM | POA: Diagnosis not present

## 2021-01-09 DIAGNOSIS — F411 Generalized anxiety disorder: Secondary | ICD-10-CM | POA: Diagnosis present

## 2021-01-09 DIAGNOSIS — J9811 Atelectasis: Secondary | ICD-10-CM | POA: Diagnosis not present

## 2021-01-09 DIAGNOSIS — R112 Nausea with vomiting, unspecified: Secondary | ICD-10-CM | POA: Diagnosis not present

## 2021-01-09 DIAGNOSIS — Z8673 Personal history of transient ischemic attack (TIA), and cerebral infarction without residual deficits: Secondary | ICD-10-CM | POA: Diagnosis not present

## 2021-01-09 DIAGNOSIS — A0839 Other viral enteritis: Secondary | ICD-10-CM | POA: Diagnosis present

## 2021-01-09 DIAGNOSIS — Z28311 Partially vaccinated for covid-19: Secondary | ICD-10-CM | POA: Diagnosis not present

## 2021-01-09 DIAGNOSIS — Z8041 Family history of malignant neoplasm of ovary: Secondary | ICD-10-CM | POA: Diagnosis not present

## 2021-01-09 DIAGNOSIS — R0602 Shortness of breath: Secondary | ICD-10-CM | POA: Diagnosis not present

## 2021-01-09 DIAGNOSIS — G8929 Other chronic pain: Secondary | ICD-10-CM | POA: Diagnosis present

## 2021-01-09 DIAGNOSIS — K580 Irritable bowel syndrome with diarrhea: Secondary | ICD-10-CM | POA: Diagnosis present

## 2021-01-09 DIAGNOSIS — F17210 Nicotine dependence, cigarettes, uncomplicated: Secondary | ICD-10-CM | POA: Diagnosis present

## 2021-01-09 DIAGNOSIS — M549 Dorsalgia, unspecified: Secondary | ICD-10-CM | POA: Diagnosis present

## 2021-01-09 DIAGNOSIS — E876 Hypokalemia: Secondary | ICD-10-CM | POA: Diagnosis present

## 2021-01-09 LAB — CBC WITH DIFFERENTIAL/PLATELET
Abs Immature Granulocytes: 0.03 10*3/uL (ref 0.00–0.07)
Basophils Absolute: 0 10*3/uL (ref 0.0–0.1)
Basophils Relative: 0 %
Eosinophils Absolute: 0 10*3/uL (ref 0.0–0.5)
Eosinophils Relative: 0 %
HCT: 43.8 % (ref 36.0–46.0)
Hemoglobin: 15.2 g/dL — ABNORMAL HIGH (ref 12.0–15.0)
Immature Granulocytes: 1 %
Lymphocytes Relative: 11 %
Lymphs Abs: 0.6 10*3/uL — ABNORMAL LOW (ref 0.7–4.0)
MCH: 29.2 pg (ref 26.0–34.0)
MCHC: 34.7 g/dL (ref 30.0–36.0)
MCV: 84.2 fL (ref 80.0–100.0)
Monocytes Absolute: 0.6 10*3/uL (ref 0.1–1.0)
Monocytes Relative: 11 %
Neutro Abs: 4.2 10*3/uL (ref 1.7–7.7)
Neutrophils Relative %: 77 %
Platelets: 276 10*3/uL (ref 150–400)
RBC: 5.2 MIL/uL — ABNORMAL HIGH (ref 3.87–5.11)
RDW: 14.4 % (ref 11.5–15.5)
WBC: 5.4 10*3/uL (ref 4.0–10.5)
nRBC: 0 % (ref 0.0–0.2)

## 2021-01-09 LAB — COMPREHENSIVE METABOLIC PANEL
ALT: 20 U/L (ref 0–44)
AST: 22 U/L (ref 15–41)
Albumin: 3.7 g/dL (ref 3.5–5.0)
Alkaline Phosphatase: 93 U/L (ref 38–126)
Anion gap: 8 (ref 5–15)
BUN: 8 mg/dL (ref 8–23)
CO2: 23 mmol/L (ref 22–32)
Calcium: 8.4 mg/dL — ABNORMAL LOW (ref 8.9–10.3)
Chloride: 97 mmol/L — ABNORMAL LOW (ref 98–111)
Creatinine, Ser: 0.56 mg/dL (ref 0.44–1.00)
GFR, Estimated: >60 mL/min (ref >60)
Glucose, Bld: 114 mg/dL — ABNORMAL HIGH (ref 70–99)
Potassium: 3 mmol/L — ABNORMAL LOW (ref 3.5–5.1)
Sodium: 128 mmol/L — ABNORMAL LOW (ref 135–145)
Total Bilirubin: 0.6 mg/dL (ref 0.3–1.2)
Total Protein: 6.8 g/dL (ref 6.5–8.1)

## 2021-01-09 LAB — D-DIMER, QUANTITATIVE: D-Dimer, Quant: 0.92 ug/mL-FEU — ABNORMAL HIGH (ref 0.00–0.50)

## 2021-01-09 LAB — PROCALCITONIN: Procalcitonin: <0.1 ng/mL

## 2021-01-09 LAB — FERRITIN: Ferritin: 35 ng/mL (ref 11–307)

## 2021-01-09 MED ORDER — ENSURE ENLIVE PO LIQD
237.0000 mL | Freq: Two times a day (BID) | ORAL | Status: DC
  Administered 2021-01-09 – 2021-01-12 (×6): 237 mL via ORAL

## 2021-01-09 MED ORDER — LACTATED RINGERS IV SOLN: INTRAVENOUS | Status: DC

## 2021-01-09 MED ORDER — SODIUM CHLORIDE 0.9 % IV SOLN
100.0000 mg | Freq: Every day | INTRAVENOUS | Status: AC
  Administered 2021-01-10: 100 mg via INTRAVENOUS
  Filled 2021-01-09: qty 100
  Filled 2021-01-09: qty 20

## 2021-01-09 MED ORDER — ADULT MULTIVITAMIN W/MINERALS CH
1.0000 | ORAL_TABLET | Freq: Every day | ORAL | Status: DC
  Administered 2021-01-09 – 2021-01-12 (×4): 1 via ORAL
  Filled 2021-01-09 (×3): qty 1

## 2021-01-09 MED ORDER — POTASSIUM CHLORIDE CRYS ER 20 MEQ PO TBCR
40.0000 meq | EXTENDED_RELEASE_TABLET | Freq: Once | ORAL | Status: AC
  Administered 2021-01-09: 40 meq via ORAL
  Filled 2021-01-09: qty 2

## 2021-01-09 NOTE — Progress Notes (Signed)
PROGRESS NOTE                                                                                                                                                                                                             Patient Demographics:    Tiffany Orozco, is a 70 y.o. female, DOB - [birth year], MB:[id number]  Outpatient Primary MD for the patient is Jinny Sanders, MD   Admit date - 01/08/2021   LOS - 0  Chief Complaint  Patient presents with   Dehydration   Nausea       Brief Narrative: Patient is a 70 y.o. female with PMHx of IBS, seasonal allergies, hypothyroidism, HTN presented with intractable nausea/vomiting and abdominal pain.  She was found to have COVID 19 infection.   COVID-19 vaccinated status: Vaccinated  Significant Events: 8/11>> Admit to [facility]-likely COVID-19 related gastroenteritis.   Significant studies: 8/11>> CT head: No acute intracranial abnormality. 8/11>> CT abdomen/pelvis: No bowel obstruction-scattered fluid-filled nondistended small bowel 8/11>> CXR: No pneumonia  COVID-19 medications: Remdesivir: 8/11>>  Antibiotics: None  Microbiology data: None  Procedures: None  Consults: None  DVT prophylaxis: enoxaparin (LOVENOX) injection 40 mg Start: 01/08/21 2200 Place TED hose Start: 01/08/21 1947    Subjective:    Tiffany Orozco today feels better than yesterday-she apparently vomited last night.  Abdominal pain is markedly improved.  Claims she has had intermittent vomiting and diarrhea for the past several months-and is followed by gastroenterology in [place].   Assessment  & Plan :   Intractable nausea/vomiting/abdominal pain: Suspect due to COVID-19 infection-superimposed on some chronic/intermittent vomiting and diarrhea (history of IBS-diarrhea predominant).  CT abdomen suggestive of enteritis.  Tolerated some liquids earlier this morning-advance to full liquids-if  tolerates well-should be able to advance further.  Abdominal exam is benign.  Continue IV fluids-and prn antiemetics.  COVID-19 infection: No pneumonia-mostly GI symptoms-plan Remdesivir x3 days.  Recent Labs    01/09/21 0541  DDIMER 0.92*  FERRITIN 35   Hyponatremia: Likely hypovolemic hyponatremia-improving with IV fluids-recheck electrolytes tomorrow.  Hypokalemia: Due to GI loss-replete and recheck.  Hypothyroidism: Continue [place] Thyroid  IBS-diarrhea predominant: Followed by GI in [place]-scheduled for colonoscopy in a few months  History of CVA: Continue Aggrenox-nonfocal exam  Nutrition Problem: Nutrition Problem: Inadequate oral intake Etiology: altered GI function Signs/Symptoms:  (nausea and vomiting PTA) Interventions:  Refer to RD note for recommendations   ABG:    Component Value Date/Time   PHART 7.457 (H) 06/11/2015 0751   PCO2ART 32.2 (L) 06/11/2015 0751   PO2ART 117 (H) 06/11/2015 0751   HCO3 22.4 06/11/2015 0751   TCO2 23.4 06/11/2015 0751   ACIDBASEDEF 1.0 06/11/2015 0751   O2SAT 97.7 06/11/2015 0751    Vent Settings: N/A    Condition - Stable  Family Communication  :  Spouse-Stephen-[phone]-daugther picked up the phone-updated family on 8/12  Code Status :  Full Code  Diet :  Diet Order             Diet full liquid Room service appropriate? Yes; Fluid consistency: Thin  Diet effective now                    Disposition Plan  :   Status is: Observation  The patient will require care spanning > 2 midnights and should be moved to inpatient because: Inpatient level of care appropriate due to severity of illness  Dispo: The patient is from: Home              Anticipated d/c is to: Home              Patient currently is not medically stable to d/c.   Difficult to place patient No   Barriers to discharge: COVID-related gastroenteritis-slowly advance diet-complete 3 days of Remdesivir  Antimicorbials  :     Anti-infectives (From admission, onward)    Start     Dose/Rate Route Frequency Ordered Stop   01/09/21 1000  remdesivir 100 mg in sodium chloride 0.9 % 100 mL IVPB  Status:  Discontinued       See Hyperspace for full Linked Orders Report.   100 mg 200 mL/hr over 30 Minutes Intravenous Daily 01/08/21 2225 01/08/21 2228   01/09/21 1000  remdesivir 100 mg in sodium chloride 0.9 % 100 mL IVPB       See Hyperspace for full Linked Orders Report.   100 mg 200 mL/hr over 30 Minutes Intravenous Daily 01/08/21 2232 01/13/21 0959   01/09/21 0000  remdesivir 100 mg in sodium chloride 0.9 % 100 mL IVPB       See Hyperspace for full Linked Orders Report.   100 mg 200 mL/hr over 30 Minutes Intravenous  Once 01/08/21 2232 01/09/21 0941   01/08/21 2330  remdesivir 100 mg in sodium chloride 0.9 % 100 mL IVPB       See Hyperspace for full Linked Orders Report.   100 mg 200 mL/hr over 30 Minutes Intravenous  Once 01/08/21 2232 01/09/21 0941   01/08/21 2315  remdesivir 200 mg in sodium chloride 0.9% 250 mL IVPB  Status:  Discontinued       See Hyperspace for full Linked Orders Report.   200 mg 580 mL/hr over 30 Minutes Intravenous Once 01/08/21 2225 01/08/21 2228       Inpatient Medications  Scheduled Meds:  albuterol  2 puff Inhalation Q6H   dipyridamole-aspirin  1 capsule Oral BID   enoxaparin (LOVENOX) injection  40 mg Subcutaneous Q24H   feeding supplement  237 mL Oral BID BM   multivitamin with minerals  1 tablet Oral Daily   thyroid  90 mg Oral q morning   Continuous Infusions:  promethazine (PHENERGAN) injection (IM or IVPB)     remdesivir 100 mg in NS 100 mL 100 mg (01/09/21 1032)   PRN Meds:.acetaminophen **OR** acetaminophen, diltiazem, fluticasone, guaiFENesin-dextromethorphan,  labetalol, ondansetron **OR** ondansetron (ZOFRAN) IV, promethazine (PHENERGAN) injection (IM or IVPB)   Time Spent in minutes  35   See all Orders from today for further details   Oren Binet  M.D on 01/09/2021 at 12:44 PM  To page go to [email or web address] - use universal password  [facility] -  Office  [phone]    Objective:   Vitals:   01/08/21 2135 01/09/21 0600 01/09/21 0749 01/09/21 1137  BP: (!) 151/81 (!) 155/63 (!) 143/78 134/70  Pulse: 83 93 91 93  Resp: '20 18 18 16  '$ Temp: 98.7 F (37.1 C) 99.2 F (37.3 C) 98.9 F (37.2 C) 98.8 F (37.1 C)  TempSrc:  Oral Oral Oral  SpO2: 95% 96% 96% 97%  Weight:      Height:        Wt Readings from Last 3 Encounters:  01/08/21 70.8 kg  12/03/20 73.9 kg  11/29/20 70.3 kg     Intake/Output Summary (Last 24 hours) at 01/09/2021 1244 Last data filed at 01/09/2021 0602 Gross per 24 hour  Intake 633.24 ml  Output --  Net 633.24 ml     Physical Exam Gen Exam:Alert awake-not in any distress.  Speech slow but clear. HEENT:atraumatic, normocephalic Chest: B/L clear to auscultation anteriorly CVS:S1S2 regular Abdomen:soft non tender, non distended Extremities:no edema Neurology: Non focal-seems to be easily moving all 4 extremities. Skin: no rash   Data Review:    CBC Recent Labs  Lab 01/08/21 1441 01/09/21 0541  WBC 5.0 5.4  HGB 15.0 15.2*  HCT 42.6 43.8  PLT 295 276  MCV 85.0 84.2  MCH 29.9 29.2  MCHC 35.2 34.7  RDW 14.6 14.4  LYMPHSABS  --  0.6*  MONOABS  --  0.6  EOSABS  --  0.0  BASOSABS  --  0.0    Chemistries  Recent Labs  Lab 01/08/21 1441 01/09/21 0541  NA 127* 128*  K 3.8 3.0*  CL 96* 97*  CO2 22 23  GLUCOSE 117* 114*  BUN 12 8  CREATININE 0.57 0.56  CALCIUM 8.9 8.4*  AST 18 22  ALT 19 20  ALKPHOS 96 93  BILITOT 0.7 0.6   ------------------------------------------------------------------------------------------------------------------ No results for input(s): CHOL, HDL, LDLCALC, TRIG, CHOLHDL, LDLDIRECT in the last 72 hours.  Lab Results  Component Value Date   HGBA1C 6.3 01/30/2016    ------------------------------------------------------------------------------------------------------------------ No results for input(s): TSH, T4TOTAL, T3FREE, THYROIDAB in the last 72 hours.  Invalid input(s): FREET3 ------------------------------------------------------------------------------------------------------------------ Recent Labs    01/09/21 0541  FERRITIN 35    Coagulation profile No results for input(s): INR, PROTIME in the last 168 hours.  Recent Labs    01/09/21 0541  DDIMER 0.92*    Cardiac Enzymes No results for input(s): CKMB, TROPONINI, MYOGLOBIN in the last 168 hours.  Invalid input(s): CK ------------------------------------------------------------------------------------------------------------------    Component Value Date/Time   BNP 75.8 06/11/2015 0820    Micro Results Recent Results (from the past 240 hour(s))  Resp Panel by RT-PCR (Flu A&B, Covid) Nasopharyngeal Swab     Status: Abnormal   Collection Time: 01/08/21  8:17 PM   Specimen: Nasopharyngeal Swab; Nasopharyngeal(NP) swabs in vial transport medium  Result Value Ref Range Status   SARS Coronavirus 2 by RT PCR POSITIVE (A) NEGATIVE Final    Comment: RESULT CALLED TO, READ BACK BY AND VERIFIED WITH: Purcell Nails RN 2134 01/08/21 HNM (NOTE) SARS-CoV-2 target nucleic acids are DETECTED.  The SARS-CoV-2 RNA is generally detectable in upper respiratory specimens during the  acute phase of infection. Positive results are indicative of the presence of the identified virus, but do not rule out bacterial infection or co-infection with other pathogens not detected by the test. Clinical correlation with patient history and other diagnostic information is necessary to determine patient infection status. The expected result is Negative.  Fact Sheet for Patients: EntrepreneurPulse.com.au  Fact Sheet for Healthcare  Providers: IncredibleEmployment.be  This test is not yet approved or cleared by the [place] FDA and  has been authorized for detection and/or diagnosis of SARS-CoV-2 by FDA under an Emergency Use Authorization (EUA).  This EUA will remain in effect (meaning this test can be  used) for the duration of  the COVID-19 declaration under Section 564(b)(1) of the Act, 21 U.S.C. section 360bbb-3(b)(1), unless the authorization is terminated or revoked sooner.     Influenza A by PCR NEGATIVE NEGATIVE Final   Influenza B by PCR NEGATIVE NEGATIVE Final    Comment: (NOTE) The Xpert Xpress SARS-CoV-2/FLU/RSV plus assay is intended as an aid in the diagnosis of influenza from Nasopharyngeal swab specimens and should not be used as a sole basis for treatment. Nasal washings and aspirates are unacceptable for Xpert Xpress SARS-CoV-2/FLU/RSV testing.  Fact Sheet for Patients: EntrepreneurPulse.com.au  Fact Sheet for Healthcare Providers: IncredibleEmployment.be  This test is not yet approved or cleared by the [place] FDA and has been authorized for detection and/or diagnosis of SARS-CoV-2 by FDA under an Emergency Use Authorization (EUA). This EUA will remain in effect (meaning this test can be used) for the duration of the COVID-19 declaration under Section 564(b)(1) of the Act, 21 U.S.C. section 360bbb-3(b)(1), unless the authorization is terminated or revoked.  Performed at [facility], [address] [place],  [address]     Radiology Reports CT HEAD WO CONTRAST (5MM)  Result Date: 01/08/2021 CLINICAL DATA:  Headache EXAM: CT HEAD WITHOUT CONTRAST TECHNIQUE: Contiguous axial images were obtained from the base of the skull through the vertex without intravenous contrast. COMPARISON:  MRI 01/25/2015, CT brain 01/25/2015 FINDINGS: Brain: No acute territorial infarction, hemorrhage or intracranial mass. Mild  atrophy. Nonenlarged ventricles Vascular: No hyperdense vessels.  Carotid vascular calcification Skull: Normal. Negative for fracture or focal lesion. Sinuses/Orbits: No acute finding. Other: None IMPRESSION: No CT evidence for acute intracranial abnormality.  Mild atrophy Electronically Signed   By: Donavan Foil M.D.   On: 01/08/2021 18:52   CT ABDOMEN PELVIS W CONTRAST  Result Date: 01/08/2021 CLINICAL DATA:  Abdomen pain with vomiting and diarrhea EXAM: CT ABDOMEN AND PELVIS WITH CONTRAST TECHNIQUE: Multidetector CT imaging of the abdomen and pelvis was performed using the standard protocol following bolus administration of intravenous contrast. CONTRAST:  151m OMNIPAQUE IOHEXOL 350 MG/ML SOLN COMPARISON:  CT 11/29/2020, 04/20/2020 FINDINGS: Lower chest: Lung bases demonstrate no acute consolidation or effusion. Borderline cardiomegaly. Hepatobiliary: No focal liver abnormality is seen. Status post cholecystectomy. No biliary dilatation. Pancreas: Unremarkable. No pancreatic ductal dilatation or surrounding inflammatory changes. Spleen: Normal in size without focal abnormality. Adrenals/Urinary Tract: Adrenal glands are unremarkable. Kidneys are normal, without renal calculi, focal lesion, or hydronephrosis. Bladder is unremarkable. Stomach/Bowel: Mild fluid distension of the stomach. Slight circumferential thickening of the second portion of duodenum without inflammatory change. Scattered fluid-filled nondistended small bowel. No acute bowel wall thickening. Vascular/Lymphatic: Moderate aortic atherosclerosis. No aneurysm. No suspicious adenopathy Reproductive: Uterus and bilateral adnexa are unremarkable. Other: Negative for pelvic effusion or free air. Small fat density lesion in the ventral soft tissues without change and  likely related to prior ventral hernia repair. Musculoskeletal: Scoliosis and degenerative changes. No acute osseous abnormality IMPRESSION: 1. Scattered fluid-filled nondistended  small bowel could be due to ileus or enteritis. No acute bowel wall thickening. No evidence for bowel obstruction at this time. Electronically Signed   By: Donavan Foil M.D.   On: 01/08/2021 18:50   DG Chest Port 1 View  Result Date: 01/08/2021 CLINICAL DATA:  Dehydration, vomiting EXAM: PORTABLE CHEST 1 VIEW COMPARISON:  11/29/2020 FINDINGS: Lungs are clear.  No pleural effusion or pneumothorax. The heart is normal in size. IMPRESSION: No evidence of acute cardiopulmonary disease. Electronically Signed   By: Julian Hy M.D.   On: 01/08/2021 20:23

## 2021-01-09 NOTE — Progress Notes (Addendum)
Initial Nutrition Assessment  DOCUMENTATION CODES:  Not applicable  INTERVENTION:  Advance diet to regular as pt tolerates Ensure Enlive po BID, each supplement provides 350 kcal and 20 grams of protein MVI with minerals daily Request new measured weight.  NUTRITION DIAGNOSIS:  Inadequate oral intake related to altered GI function as evidenced by  (nausea and vomiting PTA).  GOAL:  Patient will meet greater than or equal to 90% of their needs  MONITOR:  PO intake, Supplement acceptance, Weight trends, Diet advancement  REASON FOR ASSESSMENT:  Malnutrition Screening Tool    ASSESSMENT:  70 y.o. female with medical history significant for anxiety with depression, GERD, hypothyroidism, COPD, HLD, HTN, Hx CVA, and IBS, presented to ED at PCP's recommendation for intractable nausea and vomiting. Found to be COVID19 positive.   Pt unavailable at the time of assessment, busy signal on room phone.  No intake recorded due to isolation status. Messaged RN to obtain information on intake and GI symptoms today, states that pt has not thrown up this shift but did not eat much for breakfast. Waiting for lunch to arrive at this time. Reviewed weight hx. Overall appears stable but ? accuracy of current weight which is similar to last reported weight in ED 7/2. 73.9kg at PCP visit 7/6, will request measured weight to assess for loss.   Nutritionally Relevant Medications: Scheduled Meds:  thyroid  90 mg Oral q morning   Continuous Infusions:  promethazine (PHENERGAN) injection (IM or IVPB)     remdesivir 100 mg in NS 100 mL 100 mg (01/09/21 1032)   PRN Meds: ondansetron, promethazine  Labs Reviewed: Na 128 K 3.0  NUTRITION - FOCUSED PHYSICAL EXAM: Defer due to isolation status  Diet Order:   Diet Order             Diet full liquid Room service appropriate? Yes; Fluid consistency: Thin  Diet effective now                  EDUCATION NEEDS:  No education needs have been  identified at this time  Skin:  Skin Assessment: Reviewed RN Assessment  Last BM:  8/12 - type 6  Height:  Ht Readings from Last 1 Encounters:  01/08/21 '5\' 5"'$  (1.651 m)   Weight:  Wt Readings from Last 1 Encounters:  01/08/21 70.8 kg   Ideal Body Weight:  56.8 kg  BMI:  Body mass index is 25.96 kg/m.  Estimated Nutritional Needs:  Kcal:  1600-1800 kcal/d Protein:  80-90 g/d Fluid:  1.8-2 L/d   Ranell Patrick, RD, LDN Clinical Dietitian Pager on Amion

## 2021-01-10 ENCOUNTER — Inpatient Hospital Stay: Payer: Medicare Other

## 2021-01-10 LAB — CBC WITH DIFFERENTIAL/PLATELET
Abs Immature Granulocytes: 0.02 10*3/uL (ref 0.00–0.07)
Abs Immature Granulocytes: 0.02 10*3/uL (ref 0.00–0.07)
Basophils Absolute: 0 10*3/uL (ref 0.0–0.1)
Basophils Absolute: 0 10*3/uL (ref 0.0–0.1)
Basophils Relative: 0 %
Basophils Relative: 0 %
Eosinophils Absolute: 0 10*3/uL (ref 0.0–0.5)
Eosinophils Absolute: 0 10*3/uL (ref 0.0–0.5)
Eosinophils Relative: 0 %
Eosinophils Relative: 0 %
HCT: 43.6 % (ref 36.0–46.0)
HCT: 43.3 % (ref 36.0–46.0)
Hemoglobin: 15.4 g/dL — ABNORMAL HIGH (ref 12.0–15.0)
Hemoglobin: 15.5 g/dL — ABNORMAL HIGH (ref 12.0–15.0)
Immature Granulocytes: 0 %
Immature Granulocytes: 0 %
Lymphocytes Relative: 19 %
Lymphocytes Relative: 18 %
Lymphs Abs: 1.2 10*3/uL (ref 0.7–4.0)
Lymphs Abs: 1.1 10*3/uL (ref 0.7–4.0)
MCH: 29.6 pg (ref 26.0–34.0)
MCH: 30.2 pg (ref 26.0–34.0)
MCHC: 35.3 g/dL (ref 30.0–36.0)
MCHC: 35.8 g/dL (ref 30.0–36.0)
MCV: 83.7 fL (ref 80.0–100.0)
MCV: 84.4 fL (ref 80.0–100.0)
Monocytes Absolute: 0.6 10*3/uL (ref 0.1–1.0)
Monocytes Absolute: 0.6 10*3/uL (ref 0.1–1.0)
Monocytes Relative: 9 %
Monocytes Relative: 10 %
Neutro Abs: 4.6 10*3/uL (ref 1.7–7.7)
Neutro Abs: 4.4 10*3/uL (ref 1.7–7.7)
Neutrophils Relative %: 72 %
Neutrophils Relative %: 72 %
Platelets: 275 10*3/uL (ref 150–400)
Platelets: 276 10*3/uL (ref 150–400)
RBC: 5.21 MIL/uL — ABNORMAL HIGH (ref 3.87–5.11)
RBC: 5.13 MIL/uL — ABNORMAL HIGH (ref 3.87–5.11)
RDW: 14.5 % (ref 11.5–15.5)
RDW: 14.5 % (ref 11.5–15.5)
WBC: 6.4 10*3/uL (ref 4.0–10.5)
WBC: 6.1 10*3/uL (ref 4.0–10.5)
nRBC: 0 % (ref 0.0–0.2)
nRBC: 0 % (ref 0.0–0.2)

## 2021-01-10 LAB — URINALYSIS, COMPLETE (UACMP) WITH MICROSCOPIC
Bacteria, UA: NONE SEEN
Bilirubin Urine: NEGATIVE
Glucose, UA: NEGATIVE mg/dL
Ketones, ur: 20 mg/dL — AB
Leukocytes,Ua: NEGATIVE
Nitrite: NEGATIVE
Protein, ur: 30 mg/dL — AB
Specific Gravity, Urine: 1.024 (ref 1.005–1.030)
pH: 6 (ref 5.0–8.0)

## 2021-01-10 LAB — COMPREHENSIVE METABOLIC PANEL
ALT: 20 U/L (ref 0–44)
AST: 25 U/L (ref 15–41)
Albumin: 3.4 g/dL — ABNORMAL LOW (ref 3.5–5.0)
Alkaline Phosphatase: 78 U/L (ref 38–126)
Anion gap: 9 (ref 5–15)
BUN: 15 mg/dL (ref 8–23)
CO2: 24 mmol/L (ref 22–32)
Calcium: 8.5 mg/dL — ABNORMAL LOW (ref 8.9–10.3)
Chloride: 94 mmol/L — ABNORMAL LOW (ref 98–111)
Creatinine, Ser: 0.56 mg/dL (ref 0.44–1.00)
GFR, Estimated: >60 mL/min (ref >60)
Glucose, Bld: 105 mg/dL — ABNORMAL HIGH (ref 70–99)
Potassium: 4 mmol/L (ref 3.5–5.1)
Sodium: 127 mmol/L — ABNORMAL LOW (ref 135–145)
Total Bilirubin: 0.6 mg/dL (ref 0.3–1.2)
Total Protein: 6.2 g/dL — ABNORMAL LOW (ref 6.5–8.1)

## 2021-01-10 LAB — SODIUM, URINE, RANDOM: Sodium, Ur: 63 mmol/L

## 2021-01-10 LAB — TYPE AND SCREEN
ABO/RH(D): A POS
Antibody Screen: NEGATIVE

## 2021-01-10 LAB — URIC ACID: Uric Acid, Serum: 3.4 mg/dL (ref 2.5–7.1)

## 2021-01-10 LAB — MAGNESIUM: Magnesium: 1.9 mg/dL (ref 1.7–2.4)

## 2021-01-10 LAB — OSMOLALITY, URINE: Osmolality, Ur: 769 mOsm/kg (ref 300–900)

## 2021-01-10 LAB — BRAIN NATRIURETIC PEPTIDE: B Natriuretic Peptide: 32.1 pg/mL (ref 0.0–100.0)

## 2021-01-10 MED ORDER — PANTOPRAZOLE SODIUM 40 MG PO TBEC
40.0000 mg | DELAYED_RELEASE_TABLET | Freq: Every day | ORAL | Status: DC
  Administered 2021-01-10 – 2021-01-12 (×3): 40 mg via ORAL
  Filled 2021-01-10 (×2): qty 1

## 2021-01-10 MED ORDER — SIMETHICONE 80 MG PO CHEW
80.0000 mg | CHEWABLE_TABLET | Freq: Four times a day (QID) | ORAL | Status: DC | PRN
  Filled 2021-01-10: qty 1

## 2021-01-10 MED ORDER — LOPERAMIDE HCL 2 MG PO CAPS
2.0000 mg | ORAL_CAPSULE | ORAL | Status: DC | PRN
  Administered 2021-01-10 – 2021-01-11 (×2): 2 mg via ORAL
  Filled 2021-01-10 (×3): qty 1

## 2021-01-10 MED ORDER — FUROSEMIDE 10 MG/ML IJ SOLN
40.0000 mg | Freq: Once | INTRAMUSCULAR | Status: AC
  Administered 2021-01-10: 40 mg via INTRAVENOUS
  Filled 2021-01-10: qty 4

## 2021-01-10 MED ORDER — ACETAMINOPHEN 500 MG PO TABS
1000.0000 mg | ORAL_TABLET | Freq: Once | ORAL | Status: AC
  Administered 2021-01-11: 1000 mg via ORAL
  Filled 2021-01-10: qty 2

## 2021-01-10 NOTE — Progress Notes (Signed)
PROGRESS NOTE                                                                                                                                                                                                             Patient Demographics:    Tiffany Orozco, is a 70 y.o. female, DOB - 22-Jan-1951, IQ:[id number]  Outpatient Primary MD for the patient is Jinny Sanders, MD   Admit date - 01/08/2021   LOS - 1  Chief Complaint  Patient presents with   Dehydration   Nausea       Brief Narrative: Patient is a 70 y.o. female with PMHx of IBS, seasonal allergies, hypothyroidism, HTN presented with intractable nausea/vomiting and abdominal pain.  She was found to have COVID 19 infection.   COVID-19 vaccinated status: Vaccinated  Significant Events: 8/11>> Admit to [facility]-likely COVID-19 related gastroenteritis.   Significant studies: 8/11>> CT head: No acute intracranial abnormality. 8/11>> CT abdomen/pelvis: No bowel obstruction-scattered fluid-filled nondistended small bowel 8/11>> CXR: No pneumonia  COVID-19 medications: Remdesivir: 8/11>>  Antibiotics: None  Microbiology data: None  Procedures: None  Consults: None  DVT prophylaxis: enoxaparin (LOVENOX) injection 40 mg Start: 01/08/21 2200 Place TED hose Start: 01/08/21 1947    Subjective:   Patient in bed appears to be in no distress but has mild nausea which is also chronic, no abdominal pain, denies any chest pain or shortness of breath, no focal weakness.   Assessment  & Plan :   Intractable nausea/vomiting/abdominal pain: Suspect due to COVID-19 infection-superimposed on some chronic/intermittent vomiting and diarrhea (history of IBS-diarrhea predominant).  CT abdomen suggestive of enteritis.  Tolerated some liquids earlier this morning-advance to full liquids-if tolerates well-should be able to advance further.  Abdominal exam is benign.  Continue  to monitor with supportive care.  COVID-19 infection: No pneumonia-mostly GI symptoms-plan Remdesivir x3 days.  Recent Labs    01/09/21 0541  DDIMER 0.92*  FERRITIN 35   Hyponatremia: Due to on chronic, appears to be SIADH worse with IV fluids, urine osmolality is over 700, placed on fluid restriction, gentle Lasix and monitor.  May require Samsca.  Hypokalemia: Due to GI loss-replete and recheck.  Hypothyroidism: Continue Armour Thyroid  IBS-diarrhea predominant: Followed by GI in [place]-scheduled for colonoscopy in a few months, she has chronic diarrhea nausea and vomiting for several years.  Nonspecific CT scan changes noted will monitor with supportive care  History of CVA: Continue Aggrenox-nonfocal exam  Nutrition Problem: Nutrition Problem: Inadequate oral intake Etiology: altered GI function Signs/Symptoms:  (nausea and vomiting PTA) Interventions: Refer to RD note for recommendations     Family Communication  :  Spouse-[phone]-daugther picked up the phone-updated family on 8/12  Code Status :  Full Code  Diet :  Diet Order             Diet full liquid Room service appropriate? Yes; Fluid consistency: Thin; Fluid restriction: 1200 mL Fluid  Diet effective now                    Disposition Plan  :   Status is: Observation  The patient will require care spanning > 2 midnights and should be moved to inpatient because: Inpatient level of care appropriate due to severity of illness  Dispo: The patient is from: Home              Anticipated d/c is to: Home              Patient currently is not medically stable to d/c.   Difficult to place patient No   Barriers to discharge: COVID-related gastroenteritis-slowly advance diet-complete 3 days of Remdesivir  Antimicorbials  :    Anti-infectives (From admission, onward)    Start     Dose/Rate Route Frequency Ordered Stop   01/10/21 1000  remdesivir 100 mg in sodium chloride 0.9 % 100 mL  IVPB       See Hyperspace for full Linked Orders Report.   100 mg 200 mL/hr over 30 Minutes Intravenous Daily 01/09/21 1304 01/10/21 1039   01/09/21 1000  remdesivir 100 mg in sodium chloride 0.9 % 100 mL IVPB  Status:  Discontinued       See Hyperspace for full Linked Orders Report.   100 mg 200 mL/hr over 30 Minutes Intravenous Daily 01/08/21 2225 01/08/21 2228   01/09/21 1000  remdesivir 100 mg in sodium chloride 0.9 % 100 mL IVPB  Status:  Discontinued       See Hyperspace for full Linked Orders Report.   100 mg 200 mL/hr over 30 Minutes Intravenous Daily 01/08/21 2232 01/09/21 1304   01/09/21 0000  remdesivir 100 mg in sodium chloride 0.9 % 100 mL IVPB       See Hyperspace for full Linked Orders Report.   100 mg 200 mL/hr over 30 Minutes Intravenous  Once 01/08/21 2232 01/09/21 0941   01/08/21 2330  remdesivir 100 mg in sodium chloride 0.9 % 100 mL IVPB       See Hyperspace for full Linked Orders Report.   100 mg 200 mL/hr over 30 Minutes Intravenous  Once 01/08/21 2232 01/09/21 0941   01/08/21 2315  remdesivir 200 mg in sodium chloride 0.9% 250 mL IVPB  Status:  Discontinued       See Hyperspace for full Linked Orders Report.   200 mg 580 mL/hr over 30 Minutes Intravenous Once 01/08/21 2225 01/08/21 2228       Inpatient Medications  Scheduled Meds:  albuterol  2 puff Inhalation Q6H   dipyridamole-aspirin  1 capsule Oral BID   enoxaparin (LOVENOX) injection  40 mg Subcutaneous Q24H   feeding supplement  237 mL Oral BID BM   furosemide  40 mg Intravenous Once   multivitamin with minerals  1 tablet Oral Daily   thyroid  90 mg Oral q morning  Continuous Infusions:   PRN Meds:.acetaminophen **OR** acetaminophen, diltiazem, fluticasone, guaiFENesin-dextromethorphan, labetalol, [DISCONTINUED] ondansetron **OR** ondansetron (ZOFRAN) IV   Time Spent in minutes  35   See all Orders from today for further details   Lala Lund M.D on 01/10/2021 at 12:16 PM  To page go  to [email or web address] - use universal password  [facility] -  Office  [phone]    Objective:   Vitals:   01/09/21 2041 01/10/21 0508 01/10/21 0751 01/10/21 1210  BP: (!) 147/81 (!) 157/78 (!) 145/68 (!) 154/72  Pulse:  92 89 84  Resp:  '16 15 16  '$ Temp:  99.6 F (37.6 C) 98.1 F (36.7 C) 98.1 F (36.7 C)  TempSrc:  Oral Oral   SpO2:  99% 96% 97%  Weight:      Height:        Wt Readings from Last 3 Encounters:  01/09/21 72 kg  12/03/20 73.9 kg  11/29/20 70.3 kg     Intake/Output Summary (Last 24 hours) at 01/10/2021 1216 Last data filed at 01/10/2021 0600 Gross per 24 hour  Intake 895.94 ml  Output --  Net 895.94 ml     Physical Exam  Awake Alert, No new F.N deficits, Normal affect [place].AT,PERRAL Supple Neck,No JVD, No cervical lymphadenopathy appriciated.  Symmetrical Chest wall movement, Good air movement bilaterally, CTAB RRR,No Gallops, Rubs or new Murmurs, No Parasternal Heave +ve B.Sounds, Abd Soft, No tenderness, No organomegaly appriciated, No rebound - guarding or rigidity. No Cyanosis, Clubbing or edema, No new Rash or bruise   Data Review:   Recent Labs  Lab 01/08/21 1441 01/09/21 0541 01/10/21 0520  WBC 5.0 5.4 6.4  HGB 15.0 15.2* 15.4*  HCT 42.6 43.8 43.6  PLT 295 276 275  MCV 85.0 84.2 83.7  MCH 29.9 29.2 29.6  MCHC 35.2 34.7 35.3  RDW 14.6 14.4 14.5  LYMPHSABS  --  0.6* 1.2  MONOABS  --  0.6 0.6  EOSABS  --  0.0 0.0  BASOSABS  --  0.0 0.0    Recent Labs  Lab 01/08/21 1441 01/09/21 0541 01/10/21 0520  NA 127* 128* 127*  K 3.8 3.0* 4.0  CL 96* 97* 94*  CO2 '22 23 24  '$ GLUCOSE 117* 114* 105*  BUN '12 8 15  '$ CREATININE 0.57 0.56 0.56  CALCIUM 8.9 8.4* 8.5*  AST '18 22 25  '$ ALT '19 20 20  '$ ALKPHOS 96 93 78  BILITOT 0.7 0.6 0.6  ALBUMIN 4.0 3.7 3.4*  MG  --   --  1.9  DDIMER  --  0.92*  --   PROCALCITON <0.10 <0.10  --   BNP  --   --  32.1       Radiology Reports CT HEAD WO CONTRAST (5MM)  Result Date:  01/08/2021 CLINICAL DATA:  Headache EXAM: CT HEAD WITHOUT CONTRAST TECHNIQUE: Contiguous axial images were obtained from the base of the skull through the vertex without intravenous contrast. COMPARISON:  MRI 01/25/2015, CT brain 01/25/2015 FINDINGS: Brain: No acute territorial infarction, hemorrhage or intracranial mass. Mild atrophy. Nonenlarged ventricles Vascular: No hyperdense vessels.  Carotid vascular calcification Skull: Normal. Negative for fracture or focal lesion. Sinuses/Orbits: No acute finding. Other: None IMPRESSION: No CT evidence for acute intracranial abnormality.  Mild atrophy Electronically Signed   By: Donavan Foil M.D.   On: 01/08/2021 18:52   CT ABDOMEN PELVIS W CONTRAST  Result Date: 01/08/2021 CLINICAL DATA:  Abdomen pain with vomiting and diarrhea EXAM: CT ABDOMEN AND PELVIS WITH CONTRAST  TECHNIQUE: Multidetector CT imaging of the abdomen and pelvis was performed using the standard protocol following bolus administration of intravenous contrast. CONTRAST:  169m OMNIPAQUE IOHEXOL 350 MG/ML SOLN COMPARISON:  CT 11/29/2020, 04/20/2020 FINDINGS: Lower chest: Lung bases demonstrate no acute consolidation or effusion. Borderline cardiomegaly. Hepatobiliary: No focal liver abnormality is seen. Status post cholecystectomy. No biliary dilatation. Pancreas: Unremarkable. No pancreatic ductal dilatation or surrounding inflammatory changes. Spleen: Normal in size without focal abnormality. Adrenals/Urinary Tract: Adrenal glands are unremarkable. Kidneys are normal, without renal calculi, focal lesion, or hydronephrosis. Bladder is unremarkable. Stomach/Bowel: Mild fluid distension of the stomach. Slight circumferential thickening of the second portion of duodenum without inflammatory change. Scattered fluid-filled nondistended small bowel. No acute bowel wall thickening. Vascular/Lymphatic: Moderate aortic atherosclerosis. No aneurysm. No suspicious adenopathy Reproductive: Uterus and bilateral  adnexa are unremarkable. Other: Negative for pelvic effusion or free air. Small fat density lesion in the ventral soft tissues without change and likely related to prior ventral hernia repair. Musculoskeletal: Scoliosis and degenerative changes. No acute osseous abnormality IMPRESSION: 1. Scattered fluid-filled nondistended small bowel could be due to ileus or enteritis. No acute bowel wall thickening. No evidence for bowel obstruction at this time. Electronically Signed   By: KDonavan FoilM.D.   On: 01/08/2021 18:50   DG Chest Port 1 View  Result Date: 01/08/2021 CLINICAL DATA:  Dehydration, vomiting EXAM: PORTABLE CHEST 1 VIEW COMPARISON:  11/29/2020 FINDINGS: Lungs are clear.  No pleural effusion or pneumothorax. The heart is normal in size. IMPRESSION: No evidence of acute cardiopulmonary disease. Electronically Signed   By: SJulian HyM.D.   On: 01/08/2021 20:23

## 2021-01-11 LAB — CBC WITH DIFFERENTIAL/PLATELET
Abs Immature Granulocytes: 0.04 10*3/uL (ref 0.00–0.07)
Basophils Absolute: 0 10*3/uL (ref 0.0–0.1)
Basophils Relative: 0 %
Eosinophils Absolute: 0.2 10*3/uL (ref 0.0–0.5)
Eosinophils Relative: 2 %
HCT: 44.1 % (ref 36.0–46.0)
Hemoglobin: 15.5 g/dL — ABNORMAL HIGH (ref 12.0–15.0)
Immature Granulocytes: 1 %
Lymphocytes Relative: 24 %
Lymphs Abs: 1.9 10*3/uL (ref 0.7–4.0)
MCH: 29.5 pg (ref 26.0–34.0)
MCHC: 35.1 g/dL (ref 30.0–36.0)
MCV: 83.8 fL (ref 80.0–100.0)
Monocytes Absolute: 0.8 10*3/uL (ref 0.1–1.0)
Monocytes Relative: 9 %
Neutro Abs: 5.1 10*3/uL (ref 1.7–7.7)
Neutrophils Relative %: 64 %
Platelets: 346 10*3/uL (ref 150–400)
RBC: 5.26 MIL/uL — ABNORMAL HIGH (ref 3.87–5.11)
RDW: 14.6 % (ref 11.5–15.5)
WBC: 8.1 10*3/uL (ref 4.0–10.5)
nRBC: 0 % (ref 0.0–0.2)

## 2021-01-11 LAB — COMPREHENSIVE METABOLIC PANEL
ALT: 19 U/L (ref 0–44)
AST: 24 U/L (ref 15–41)
Albumin: 3.6 g/dL (ref 3.5–5.0)
Alkaline Phosphatase: 73 U/L (ref 38–126)
Anion gap: 11 (ref 5–15)
BUN: 26 mg/dL — ABNORMAL HIGH (ref 8–23)
CO2: 23 mmol/L (ref 22–32)
Calcium: 8.5 mg/dL — ABNORMAL LOW (ref 8.9–10.3)
Chloride: 95 mmol/L — ABNORMAL LOW (ref 98–111)
Creatinine, Ser: 0.88 mg/dL (ref 0.44–1.00)
GFR, Estimated: >60 mL/min (ref >60)
Glucose, Bld: 101 mg/dL — ABNORMAL HIGH (ref 70–99)
Potassium: 3.5 mmol/L (ref 3.5–5.1)
Sodium: 129 mmol/L — ABNORMAL LOW (ref 135–145)
Total Bilirubin: 0.7 mg/dL (ref 0.3–1.2)
Total Protein: 6.4 g/dL — ABNORMAL LOW (ref 6.5–8.1)

## 2021-01-11 LAB — BRAIN NATRIURETIC PEPTIDE: B Natriuretic Peptide: 27.6 pg/mL (ref 0.0–100.0)

## 2021-01-11 LAB — MAGNESIUM: Magnesium: 2.1 mg/dL (ref 1.7–2.4)

## 2021-01-11 LAB — OSMOLALITY: Osmolality: 276 mOsm/kg (ref 275–295)

## 2021-01-11 MED ORDER — ACETAMINOPHEN 500 MG PO TABS
500.0000 mg | ORAL_TABLET | Freq: Four times a day (QID) | ORAL | Status: AC | PRN
  Administered 2021-01-11: 500 mg via ORAL
  Filled 2021-01-11 (×2): qty 1

## 2021-01-11 MED ORDER — POTASSIUM CHLORIDE CRYS ER 20 MEQ PO TBCR
40.0000 meq | EXTENDED_RELEASE_TABLET | Freq: Once | ORAL | Status: AC
  Administered 2021-01-11: 40 meq via ORAL
  Filled 2021-01-11: qty 2

## 2021-01-11 MED ORDER — ACETAMINOPHEN 325 MG PO TABS
650.0000 mg | ORAL_TABLET | ORAL | Status: DC | PRN
  Administered 2021-01-12 (×2): 650 mg via ORAL
  Filled 2021-01-11 (×2): qty 2

## 2021-01-11 MED ORDER — ACETAMINOPHEN 650 MG RE SUPP: 650.0000 mg | Freq: Four times a day (QID) | RECTAL | Status: AC | PRN

## 2021-01-11 NOTE — Progress Notes (Signed)
PROGRESS NOTE                                                                                                                                                                                                             Patient Demographics:    Tiffany Orozco, is a 70 y.o. female, DOB - October 31, 1950, IQ:[id number]  Outpatient Primary MD for the patient is Jinny Sanders, MD   Admit date - 01/08/2021   LOS - 2  Chief Complaint  Patient presents with   Dehydration   Nausea       Brief Narrative: Patient is a 70 y.o. female with PMHx of IBS, seasonal allergies, hypothyroidism, HTN presented with intractable nausea/vomiting and abdominal pain.  She was found to have COVID 19 infection.   COVID-19 vaccinated status: Vaccinated  Significant Events: 8/11>> Admit to [facility]-likely COVID-19 related gastroenteritis.   Significant studies: 8/11>> CT head: No acute intracranial abnormality. 8/11>> CT abdomen/pelvis: No bowel obstruction-scattered fluid-filled nondistended small bowel 8/11>> CXR: No pneumonia  COVID-19 medications: Remdesivir: 8/11>>  Antibiotics: None  Microbiology data: None  Procedures: None  Consults: None  DVT prophylaxis: enoxaparin (LOVENOX) injection 40 mg Start: 01/08/21 2200 Place TED hose Start: 01/08/21 1947    Subjective:   Patient in bed, appears comfortable, denies any headache, no fever, no chest pain or pressure, no shortness of breath , no nausea, improved abdominal pain. No new focal weakness.   Assessment  & Plan :   Intractable nausea/vomiting/abdominal pain: Suspect due to COVID-19 infection-superimposed on some chronic/intermittent vomiting and diarrhea (history of IBS-diarrhea predominant). CT abdomen suggestive of enteritis, symptoms much improved after supportive care, has tolerated liquid diet will be advanced to soft, discussed with patient and daughter if stable likely  discharge on 01/12/2021 with outpatient GI follow-up.  COVID-19 infection: No pneumonia-mostly GI symptoms-plan Remdesivir x3 days.  Recent Labs    01/09/21 0541  DDIMER 0.92*  FERRITIN 35    Hyponatremia: Due to on chronic appears to be SIADH, improved after fluid restriction and Lasix.  Patient and daughter both educated on fluid restriction apparently she drinks a lot of water and sweet tea at home per daughter.  PCP to continue to monitor sodium level improving trend is stable.  Hypokalemia: Replaced.  Hypothyroidism: Continue Armour Thyroid  IBS - diarrhea predominant: Followed by GI in [place]-follow-up within  1 week of discharge.  Patient and daughter updated.  History of CVA: Continue Aggrenox-nonfocal exam head CT stable, seen and cleared by PT OT.  Nutrition Problem: Nutrition Problem: Inadequate oral intake Etiology: altered GI function Signs/Symptoms:  (nausea and vomiting PTA) Interventions: Refer to RD note for recommendations     Family Communication  :    Spouse-[phone]-daugther picked up the phone-updated family on 01/09/21 Daughter Mickel Baas [phone] on 01/11/21  Code Status :  Full Code  Diet :  Diet Order             DIET SOFT Room service appropriate? Yes; Fluid consistency: Nectar Thick; Fluid restriction: 1200 mL Fluid  Diet effective now                    Disposition Plan  :   Status is: Observation  The patient will require care spanning > 2 midnights and should be moved to inpatient because: Inpatient level of care appropriate due to severity of illness  Dispo: The patient is from: Home              Anticipated d/c is to: Home              Patient currently is not medically stable to d/c.   Difficult to place patient No   Barriers to discharge: COVID-related gastroenteritis-slowly advance diet-complete 3 days of Remdesivir    Inpatient Medications  Scheduled Meds:  albuterol  2 puff Inhalation Q6H    dipyridamole-aspirin  1 capsule Oral BID   enoxaparin (LOVENOX) injection  40 mg Subcutaneous Q24H   feeding supplement  237 mL Oral BID BM   multivitamin with minerals  1 tablet Oral Daily   pantoprazole  40 mg Oral Daily   thyroid  90 mg Oral q morning   Continuous Infusions:   PRN Meds:.acetaminophen **OR** acetaminophen, diltiazem, fluticasone, guaiFENesin-dextromethorphan, labetalol, loperamide, [DISCONTINUED] ondansetron **OR** ondansetron (ZOFRAN) IV, simethicone   Time Spent in minutes  35   See all Orders from today for further details   Lala Lund M.D on 01/11/2021 at 9:55 AM  To page go to [email or web address] - use universal password  [facility] -  Office  [phone]    Objective:   Vitals:   01/10/21 1534 01/10/21 2036 01/11/21 0351 01/11/21 0738  BP: (!) 154/73 115/67 (!) 147/72 111/64  Pulse: 83 90 96 86  Resp: '16 18 18 16  '$ Temp: 99.1 F (37.3 C) 98.7 F (37.1 C) 98 F (36.7 C) 98.9 F (37.2 C)  TempSrc: Oral Oral Oral Oral  SpO2: 96% 97% 96% 98%  Weight:      Height:        Wt Readings from Last 3 Encounters:  01/09/21 72 kg  12/03/20 73.9 kg  11/29/20 70.3 kg     Intake/Output Summary (Last 24 hours) at 01/11/2021 0955 Last data filed at 01/11/2021 0748 Gross per 24 hour  Intake 370 ml  Output 150 ml  Net 220 ml     Physical Exam  Awake Alert, No new F.N deficits, Normal affect [place].AT,PERRAL Supple Neck,No JVD, No cervical lymphadenopathy appriciated.  Symmetrical Chest wall movement, Good air movement bilaterally, CTAB RRR,No Gallops, Rubs or new Murmurs, No Parasternal Heave +ve B.Sounds, Abd Soft, No tenderness, No organomegaly appriciated, No rebound - guarding or rigidity. No Cyanosis, Clubbing or edema, No new Rash or bruise    Data Review:   Recent Labs  Lab 01/08/21 1441 01/09/21 0541 01/10/21 0520 01/10/21 1537 01/11/21 [id number]  WBC 5.0 5.4 6.4 6.1 8.1  HGB 15.0 15.2* 15.4* 15.5* 15.5*  HCT 42.6 43.8 43.6 43.3  44.1  PLT 295 276 275 276 346  MCV 85.0 84.2 83.7 84.4 83.8  MCH 29.9 29.2 29.6 30.2 29.5  MCHC 35.2 34.7 35.3 35.8 35.1  RDW 14.6 14.4 14.5 14.5 14.6  LYMPHSABS  --  0.6* 1.2 1.1 1.9  MONOABS  --  0.6 0.6 0.6 0.8  EOSABS  --  0.0 0.0 0.0 0.2  BASOSABS  --  0.0 0.0 0.0 0.0    Recent Labs  Lab 01/08/21 1441 01/09/21 0541 01/10/21 0520 01/11/21 0517  NA 127* 128* 127* 129*  K 3.8 3.0* 4.0 3.5  CL 96* 97* 94* 95*  CO2 '22 23 24 23  '$ GLUCOSE 117* 114* 105* 101*  BUN '12 8 15 '$ 26*  CREATININE 0.57 0.56 0.56 0.88  CALCIUM 8.9 8.4* 8.5* 8.5*  AST '18 22 25 24  '$ ALT '19 20 20 19  '$ ALKPHOS 96 93 78 73  BILITOT 0.7 0.6 0.6 0.7  ALBUMIN 4.0 3.7 3.4* 3.6  MG  --   --  1.9 2.1  DDIMER  --  0.92*  --   --   PROCALCITON <0.10 <0.10  --   --   BNP  --   --  32.1 27.6       Radiology Reports CT HEAD WO CONTRAST (5MM)  Result Date: 01/08/2021 CLINICAL DATA:  Headache EXAM: CT HEAD WITHOUT CONTRAST TECHNIQUE: Contiguous axial images were obtained from the base of the skull through the vertex without intravenous contrast. COMPARISON:  MRI 01/25/2015, CT brain 01/25/2015 FINDINGS: Brain: No acute territorial infarction, hemorrhage or intracranial mass. Mild atrophy. Nonenlarged ventricles Vascular: No hyperdense vessels.  Carotid vascular calcification Skull: Normal. Negative for fracture or focal lesion. Sinuses/Orbits: No acute finding. Other: None IMPRESSION: No CT evidence for acute intracranial abnormality.  Mild atrophy Electronically Signed   By: Donavan Foil M.D.   On: 01/08/2021 18:52   CT ABDOMEN PELVIS W CONTRAST  Result Date: 01/08/2021 CLINICAL DATA:  Abdomen pain with vomiting and diarrhea EXAM: CT ABDOMEN AND PELVIS WITH CONTRAST TECHNIQUE: Multidetector CT imaging of the abdomen and pelvis was performed using the standard protocol following bolus administration of intravenous contrast. CONTRAST:  162m OMNIPAQUE IOHEXOL 350 MG/ML SOLN COMPARISON:  CT 11/29/2020, 04/20/2020 FINDINGS:  Lower chest: Lung bases demonstrate no acute consolidation or effusion. Borderline cardiomegaly. Hepatobiliary: No focal liver abnormality is seen. Status post cholecystectomy. No biliary dilatation. Pancreas: Unremarkable. No pancreatic ductal dilatation or surrounding inflammatory changes. Spleen: Normal in size without focal abnormality. Adrenals/Urinary Tract: Adrenal glands are unremarkable. Kidneys are normal, without renal calculi, focal lesion, or hydronephrosis. Bladder is unremarkable. Stomach/Bowel: Mild fluid distension of the stomach. Slight circumferential thickening of the second portion of duodenum without inflammatory change. Scattered fluid-filled nondistended small bowel. No acute bowel wall thickening. Vascular/Lymphatic: Moderate aortic atherosclerosis. No aneurysm. No suspicious adenopathy Reproductive: Uterus and bilateral adnexa are unremarkable. Other: Negative for pelvic effusion or free air. Small fat density lesion in the ventral soft tissues without change and likely related to prior ventral hernia repair. Musculoskeletal: Scoliosis and degenerative changes. No acute osseous abnormality IMPRESSION: 1. Scattered fluid-filled nondistended small bowel could be due to ileus or enteritis. No acute bowel wall thickening. No evidence for bowel obstruction at this time. Electronically Signed   By: KDonavan FoilM.D.   On: 01/08/2021 18:50   DG Chest Port 1 View  Result Date: 01/10/2021 CLINICAL DATA:  Dehydration.  COVID-19 positive. EXAM: PORTABLE CHEST 1 VIEW COMPARISON:  January 08, 2021 FINDINGS: The cardiomediastinal silhouette is stable. No pneumothorax. No nodules or masses. No focal infiltrates. Mild scar atelectasis in the lateral left lung base. IMPRESSION: No acute abnormality or change. No focal infiltrate. Mild scar atelectasis in the lateral left lung base. Electronically Signed   By: Dorise Bullion III M.D.   On: 01/10/2021 13:33   DG Chest Port 1 View  Result Date:  01/08/2021 CLINICAL DATA:  Dehydration, vomiting EXAM: PORTABLE CHEST 1 VIEW COMPARISON:  11/29/2020 FINDINGS: Lungs are clear.  No pleural effusion or pneumothorax. The heart is normal in size. IMPRESSION: No evidence of acute cardiopulmonary disease. Electronically Signed   By: Julian Hy M.D.   On: 01/08/2021 20:23

## 2021-01-11 NOTE — Progress Notes (Signed)
PT Cancellation Note  Patient Details Name: Tiffany Orozco MRN: [id number] DOB: Sep 06, 1950   Cancelled Treatment:    Reason Eval/Treat Not Completed: PT screened, no needs identified, will sign off. The patient was able to transfer in/out of bed independently, ambulate in room independently, no unsteadiness/LOB noted. Discussed home set up, daughter plans on assisting patient as needed. The patient demonstrated and reported return to baseline level of functioning, no further acute PT needs indicated. PT to sign off. Please reconsult PT if pt status changes or acute needs are identified.   Lieutenant Diego PT, DPT 9:04 AM,01/11/21

## 2021-01-11 NOTE — Progress Notes (Signed)
OT Cancellation Note  Patient Details Name: BRINKLEE CRUSER MRN: [id number] DOB: 06-02-1950   Cancelled Treatment:    Reason Eval/Treat Not Completed: OT screened, no needs identified, will sign off. Per PT, pt ambulating and performing all self care tasks independently. No skilled need for OT intervention. OT to sign off.   Darleen Crocker, MS, OTR/L , CBIS ascom [phone]  01/11/21, 9:20 AM

## 2021-01-11 NOTE — Plan of Care (Signed)
Pts pain continues to be at 7-8. She would like max strength tylenol. One occurrence of diarrhea stated by patient. Problem: Education: Goal: Knowledge of General Education information will improve Description: Including pain rating scale, medication(s)/side effects and non-pharmacologic comfort measures Outcome: Progressing   Problem: Health Behavior/Discharge Planning: Goal: Ability to manage health-related needs will improve Outcome: Progressing   Problem: Clinical Measurements: Goal: Ability to maintain clinical measurements within normal limits will improve Outcome: Progressing Goal: Will remain free from infection Outcome: Progressing Goal: Diagnostic test results will improve Outcome: Progressing Goal: Respiratory complications will improve Outcome: Progressing Goal: Cardiovascular complication will be avoided Outcome: Progressing   Problem: Activity: Goal: Risk for activity intolerance will decrease Outcome: Progressing   Problem: Nutrition: Goal: Adequate nutrition will be maintained Outcome: Progressing   Problem: Coping: Goal: Level of anxiety will decrease Outcome: Progressing   Problem: Elimination: Goal: Will not experience complications related to bowel motility Outcome: Progressing Goal: Will not experience complications related to urinary retention Outcome: Progressing   Problem: Pain Managment: Goal: General experience of comfort will improve Outcome: Progressing   Problem: Safety: Goal: Ability to remain free from injury will improve Outcome: Progressing   Problem: Skin Integrity: Goal: Risk for impaired skin integrity will decrease Outcome: Progressing   Problem: Education: Goal: Knowledge of risk factors and measures for prevention of condition will improve Outcome: Progressing   Problem: Coping: Goal: Psychosocial and spiritual needs will be supported Outcome: Progressing   Problem: Respiratory: Goal: Will maintain a patent  airway Outcome: Progressing Goal: Complications related to the disease process, condition or treatment will be avoided or minimized Outcome: Progressing

## 2021-01-12 NOTE — Progress Notes (Signed)
VSS, IV CATH removed site c/d/I. Discussed d/c packet with pts she verbalized her understanding. Follow up appointment has already been made for pt, pt educated on the 1200 fluid restriction. Pt transported via w/c down to personal car with all her personal belongings.

## 2021-01-12 NOTE — Care Management Important Message (Signed)
Important Message  Patient Details  Name: GENEVIENE DASHNER MRN: [id number] Date of Birth: 1950-09-29   Medicare Important Message Given:  Other (see comment)  Patient is in an isolation room so I called her room [phone]) to review the Important Message from Medicare but there was no answer. Will try again.   Juliann Pulse A Wealthy Danielski 01/12/2021, 11:57 AM

## 2021-01-12 NOTE — Discharge Summary (Signed)
Tiffany Orozco [id number] DOB: 05-05-1951 DOA: 01/08/2021  PCP: Jinny Sanders, MD  Admit date: 01/08/2021  Discharge date: 01/12/2021  Admitted From: Home Disposition:  Home   Recommendations for Outpatient Follow-up:   Follow up with PCP in 1-2 weeks  PCP Please obtain BMP/CBC, 2 view CXR in 1week,  (see Discharge instructions)   PCP Please follow up on the following pending results: Check CBC, CMP in a week make sure patient follows with her gastroenterologist within a week for ongoing intermittent nausea vomiting and diarrhea for several months.   Home Health: None   Equipment/Devices: None  Consultations: None  Discharge Condition: Stable    CODE STATUS: Full    Diet Recommendation: Heart Healthy with strict 1.5 L/day total fluid restriction.   Chief Complaint  Patient presents with   Dehydration   Nausea     Brief history of present illness from the day of admission and additional interim summary    Patient is a 70 y.o. female with PMHx of IBS, seasonal allergies, hypothyroidism, HTN presented with intractable nausea/vomiting and abdominal pain.  She was found to have COVID 19 infection.                                                                 Hospital Course   Intermittent acute on chronic nausea/vomiting/abdominal pain and episodes of diarrhea: Suspect due to COVID-19 infection-superimposed on some chronic/intermittent vomiting and diarrhea (history of IBS-diarrhea predominant). CT abdomen suggestive of enteritis, symptoms much improved after supportive care, now tolerating soft diet and at baseline, will be discharged home with outpatient PCP and primary GI follow-up within a week.   COVID-19 infection: No pneumonia-mostly GI symptoms-is post 3 doses of remdesivir.  No pulmonary  symptoms.  Able inflammatory markers.   SpO2: 95 %  Recent Labs  Lab 01/08/21 1441 01/08/21 2017 01/09/21 0541 01/10/21 0520 01/10/21 1537 01/11/21 0517  WBC 5.0  --  5.4 6.4 6.1 8.1  DDIMER  --   --  0.92*  --   --   --   BNP  --   --   --  32.1  --  27.6  PROCALCITON <0.10  --  <0.10  --   --   --   AST 18  --  22 25  --  24  ALT 19  --  20 20  --  19  ALKPHOS 96  --  93 78  --  73  BILITOT 0.7  --  0.6 0.6  --  0.7  ALBUMIN 4.0  --  3.7 3.4*  --  3.6  SARSCOV2NAA  --  POSITIVE*  --   --   --   --       Hyponatremia: Due to on chronic appears to be SIADH, improved after  fluid restriction and Lasix.  Patient and daughter both educated on fluid restriction apparently she drinks a lot of water and sweet tea at home per daughter.  PCP to continue to monitor sodium level improving trend is stable.   Hypokalemia: Replaced.   Hypothyroidism: Continue Armour Thyroid   IBS - diarrhea predominant: Followed by GI in [place]-follow-up within 1 week of discharge.  Patient and daughter updated.   History of CVA: Continue Aggrenox-nonfocal exam head CT stable, seen and cleared by PT OT.   Discharge diagnosis     Principal Problem:   Intractable vomiting with nausea Active Problems:   Hypothyroidism   Generalized anxiety disorder   Depression, major, single episode, moderate (HCC)   Allergic rhinitis   GERD   Hyponatremia   HTN (hypertension)   Chronic back pain   Coronary atherosclerosis of native coronary artery    Discharge instructions    Discharge Instructions     Discharge instructions   Complete by: As directed    Follow with Primary MD Jinny Sanders, MD and with your gastroenterologist in 7 days   Get CBC, CMP, 2 view Chest X ray -  checked next visit within 1 week by Primary MD    Activity: As tolerated with Full fall precautions use walker/cane & assistance as needed  Disposition Home    Diet: Heart Healthy - Strict 1.2 lit / day fluid  restriction  Special Instructions: If you have smoked or chewed Tobacco  in the last 2 yrs please stop smoking, stop any regular Alcohol  and or any Recreational drug use.  On your next visit with your primary care physician please Get Medicines reviewed and adjusted.  Please request your Prim.MD to go over all Hospital Tests and Procedure/Radiological results at the follow up, please get all Hospital records sent to your Prim MD by signing hospital release before you go home.  If you experience worsening of your admission symptoms, develop shortness of breath, life threatening emergency, suicidal or homicidal thoughts you must seek medical attention immediately by calling 911 or calling your MD immediately  if symptoms less severe.  You Must read complete instructions/literature along with all the possible adverse reactions/side effects for all the Medicines you take and that have been prescribed to you. Take any new Medicines after you have completely understood and accpet all the possible adverse reactions/side effects.   Increase activity slowly   Complete by: As directed        Discharge Medications   Allergies as of 01/12/2021       Reactions   Lisinopril Shortness Of Breath   Niacin Other (See Comments)    dizziness   Welchol [colesevelam Hcl] Diarrhea, Nausea Only, Swelling, Other (See Comments)    swelling in face   Augmentin [amoxicillin-pot Clavulanate] Nausea Only   GI upset with taking.   Codeine    keeps patient awake   Oxycodone Hcl    Patient states this medication makes her hyper        Medication List     STOP taking these medications    amoxicillin-clavulanate 875-125 MG tablet Commonly known as: AUGMENTIN   ondansetron 8 MG tablet Commonly known as: ZOFRAN       TAKE these medications    Armour Thyroid 90 MG tablet Generic drug: thyroid Take one tablet by mouth daily.   dipyridamole-aspirin 200-25 MG 12hr capsule Commonly known as:  AGGRENOX Take 1 capsule by mouth 2 (two) times daily.   fluticasone 50  MCG/ACT nasal spray Commonly known as: FLONASE Place 1-2 sprays into both nostrils daily as needed for allergies or rhinitis.   ondansetron 4 MG disintegrating tablet Commonly known as: Zofran ODT Take 1 tablet (4 mg total) by mouth every 8 (eight) hours as needed for nausea or vomiting.   Vitamin D3 125 MCG (5000 UT) Tabs Take 5,000 Units by mouth daily.         Follow-up Information     Jinny Sanders, MD. Schedule an appointment as soon as possible for a visit in 1 week(s).   Specialty: Family Medicine Contact information: [address] [place] [address] [phone]                 Major procedures and Radiology Reports - PLEASE review detailed and final reports thoroughly  -        CT HEAD WO CONTRAST (5MM)  Result Date: 01/08/2021 CLINICAL DATA:  Headache EXAM: CT HEAD WITHOUT CONTRAST TECHNIQUE: Contiguous axial images were obtained from the base of the skull through the vertex without intravenous contrast. COMPARISON:  MRI 01/25/2015, CT brain 01/25/2015 FINDINGS: Brain: No acute territorial infarction, hemorrhage or intracranial mass. Mild atrophy. Nonenlarged ventricles Vascular: No hyperdense vessels.  Carotid vascular calcification Skull: Normal. Negative for fracture or focal lesion. Sinuses/Orbits: No acute finding. Other: None IMPRESSION: No CT evidence for acute intracranial abnormality.  Mild atrophy Electronically Signed   By: Donavan Foil M.D.   On: 01/08/2021 18:52   CT ABDOMEN PELVIS W CONTRAST  Result Date: 01/08/2021 CLINICAL DATA:  Abdomen pain with vomiting and diarrhea EXAM: CT ABDOMEN AND PELVIS WITH CONTRAST TECHNIQUE: Multidetector CT imaging of the abdomen and pelvis was performed using the standard protocol following bolus administration of intravenous contrast. CONTRAST:  127m OMNIPAQUE IOHEXOL 350 MG/ML SOLN COMPARISON:  CT 11/29/2020, 04/20/2020  FINDINGS: Lower chest: Lung bases demonstrate no acute consolidation or effusion. Borderline cardiomegaly. Hepatobiliary: No focal liver abnormality is seen. Status post cholecystectomy. No biliary dilatation. Pancreas: Unremarkable. No pancreatic ductal dilatation or surrounding inflammatory changes. Spleen: Normal in size without focal abnormality. Adrenals/Urinary Tract: Adrenal glands are unremarkable. Kidneys are normal, without renal calculi, focal lesion, or hydronephrosis. Bladder is unremarkable. Stomach/Bowel: Mild fluid distension of the stomach. Slight circumferential thickening of the second portion of duodenum without inflammatory change. Scattered fluid-filled nondistended small bowel. No acute bowel wall thickening. Vascular/Lymphatic: Moderate aortic atherosclerosis. No aneurysm. No suspicious adenopathy Reproductive: Uterus and bilateral adnexa are unremarkable. Other: Negative for pelvic effusion or free air. Small fat density lesion in the ventral soft tissues without change and likely related to prior ventral hernia repair. Musculoskeletal: Scoliosis and degenerative changes. No acute osseous abnormality IMPRESSION: 1. Scattered fluid-filled nondistended small bowel could be due to ileus or enteritis. No acute bowel wall thickening. No evidence for bowel obstruction at this time. Electronically Signed   By: KDonavan FoilM.D.   On: 01/08/2021 18:50   DG Chest Port 1 View  Result Date: 01/10/2021 CLINICAL DATA:  Dehydration.  COVID-19 positive. EXAM: PORTABLE CHEST 1 VIEW COMPARISON:  January 08, 2021 FINDINGS: The cardiomediastinal silhouette is stable. No pneumothorax. No nodules or masses. No focal infiltrates. Mild scar atelectasis in the lateral left lung base. IMPRESSION: No acute abnormality or change. No focal infiltrate. Mild scar atelectasis in the lateral left lung base. Electronically Signed   By: DDorise BullionIII M.D.   On: 01/10/2021 13:33   DG Chest Port 1 View  Result  Date: 01/08/2021 CLINICAL DATA:  Dehydration, vomiting EXAM: PORTABLE CHEST 1 VIEW COMPARISON:  11/29/2020 FINDINGS: Lungs are clear.  No pleural effusion or pneumothorax. The heart is normal in size. IMPRESSION: No evidence of acute cardiopulmonary disease. Electronically Signed   By: Julian Hy M.D.   On: 01/08/2021 20:23      Today   Subjective    Fredna Lahti today has no headache,no chest abdominal pain,no new weakness tingling or numbness, feels much better wants to go home today.     Objective   Blood pressure (!) 101/56, pulse 68, temperature 98.4 F (36.9 C), temperature source Oral, resp. rate 17, height '5\' 5"'$  (1.651 m), weight 72 kg, SpO2 95 %.   Intake/Output Summary (Last 24 hours) at 01/12/2021 0943 Last data filed at 01/12/2021 [id number] Gross per 24 hour  Intake 240 ml  Output 100 ml  Net 140 ml    Exam  Awake Alert, No new F.N deficits, Normal affect [place].AT,PERRAL Supple Neck,No JVD, No cervical lymphadenopathy appriciated.  Symmetrical Chest wall movement, Good air movement bilaterally, CTAB RRR,No Gallops,Rubs or new Murmurs, No Parasternal Heave +ve B.Sounds, Abd Soft, Non tender, No organomegaly appriciated, No rebound -guarding or rigidity. No Cyanosis, Clubbing or edema, No new Rash or bruise   Data Review   CBC w Diff:  Lab Results  Component Value Date   WBC 8.1 01/11/2021   HGB 15.5 (H) 01/11/2021   HCT 44.1 01/11/2021   PLT 346 01/11/2021   LYMPHOPCT 24 01/11/2021   MONOPCT 9 01/11/2021   EOSPCT 2 01/11/2021   BASOPCT 0 01/11/2021    CMP:  Lab Results  Component Value Date   NA 129 (L) 01/11/2021   K 3.5 01/11/2021   CL 95 (L) 01/11/2021   CO2 23 01/11/2021   BUN 26 (H) 01/11/2021   CREATININE 0.88 01/11/2021   PROT 6.4 (L) 01/11/2021   ALBUMIN 3.6 01/11/2021   BILITOT 0.7 01/11/2021   ALKPHOS 73 01/11/2021   AST 24 01/11/2021   ALT 19 01/11/2021  .   Total Time in preparing paper work, data evaluation and todays exam - 63  minutes  Lala Lund M.D on 01/12/2021 at 9:43 AM  [facility]

## 2021-01-12 NOTE — Discharge Instructions (Signed)
Follow with Primary MD Jinny Sanders, MD and with your gastroenterologist in 7 days   Get CBC, CMP, 2 view Chest X ray -  checked next visit within 1 week by Primary MD    Activity: As tolerated with Full fall precautions use walker/cane & assistance as needed  Disposition Home    Diet: Heart Healthy - Strict 1.2 lit / day fluid restriction  Special Instructions: If you have smoked or chewed Tobacco  in the last 2 yrs please stop smoking, stop any regular Alcohol  and or any Recreational drug use.  On your next visit with your primary care physician please Get Medicines reviewed and adjusted.  Please request your Prim.MD to go over all Hospital Tests and Procedure/Radiological results at the follow up, please get all Hospital records sent to your Prim MD by signing hospital release before you go home.  If you experience worsening of your admission symptoms, develop shortness of breath, life threatening emergency, suicidal or homicidal thoughts you must seek medical attention immediately by calling 911 or calling your MD immediately  if symptoms less severe.  You Must read complete instructions/literature along with all the possible adverse reactions/side effects for all the Medicines you take and that have been prescribed to you. Take any new Medicines after you have completely understood and accpet all the possible adverse reactions/side effects.

## 2021-01-14 ENCOUNTER — Telehealth: Payer: Self-pay | Admitting: *Deleted

## 2021-01-14 NOTE — Telephone Encounter (Signed)
Tiffany Orozco notified as instructed by telephone.  Patient states understanding.

## 2021-01-14 NOTE — Telephone Encounter (Signed)
Just have her keep appt will discuss issues further and repeat labs  at that time.

## 2021-01-14 NOTE — Telephone Encounter (Signed)
Patient called stating that she got out of the hospital 01/12/21. Patient stated on top of everything else she had covid also. Patient stated that she wants to make sure that Dr. Diona Browner reviews all of her test that were done at the hospital. Patient stated that she needs follow-up labs and x-rays. Patient stated that she has problems with dehydration and was told to drink only 1.2 liters of fluids daily. Patient stated that she has a hospital follow-up scheduled with Dr. Diona Browner 01/20/21 and wants to know if anything needs to be done prior to that visit. Patient stated that she has a terrible taste in her mouth. Patient is concerned about the restrictions on her fluids and salt.

## 2021-01-15 DIAGNOSIS — U071 COVID-19: Secondary | ICD-10-CM | POA: Diagnosis not present

## 2021-01-20 ENCOUNTER — Encounter: Payer: Self-pay | Admitting: Family Medicine

## 2021-01-20 ENCOUNTER — Other Ambulatory Visit: Payer: Self-pay

## 2021-01-20 ENCOUNTER — Ambulatory Visit (INDEPENDENT_AMBULATORY_CARE_PROVIDER_SITE_OTHER): Payer: Medicare Other | Admitting: Family Medicine

## 2021-01-20 ENCOUNTER — Telehealth: Payer: Self-pay | Admitting: Nurse Practitioner

## 2021-01-20 VITALS — BP 120/60 | HR 83 | Temp 98.1°F | Ht 60.0 in | Wt 158.0 lb

## 2021-01-20 DIAGNOSIS — U071 COVID-19: Secondary | ICD-10-CM | POA: Diagnosis not present

## 2021-01-20 DIAGNOSIS — K529 Noninfective gastroenteritis and colitis, unspecified: Secondary | ICD-10-CM

## 2021-01-20 DIAGNOSIS — E871 Hypo-osmolality and hyponatremia: Secondary | ICD-10-CM

## 2021-01-20 DIAGNOSIS — E039 Hypothyroidism, unspecified: Secondary | ICD-10-CM | POA: Diagnosis not present

## 2021-01-20 LAB — COMPREHENSIVE METABOLIC PANEL
ALT: 16 U/L (ref 0–35)
AST: 12 U/L (ref 0–37)
Albumin: 3.7 g/dL (ref 3.5–5.2)
Alkaline Phosphatase: 77 U/L (ref 39–117)
BUN: 9 mg/dL (ref 6–23)
CO2: 25 mEq/L (ref 19–32)
Calcium: 8.8 mg/dL (ref 8.4–10.5)
Chloride: 99 mEq/L (ref 96–112)
Creatinine, Ser: 0.72 mg/dL (ref 0.40–1.20)
GFR: 83.61 mL/min (ref >60.00)
Glucose, Bld: 86 mg/dL (ref 70–99)
Potassium: 4.9 mEq/L (ref 3.5–5.1)
Sodium: 132 mEq/L — ABNORMAL LOW (ref 135–145)
Total Bilirubin: 0.4 mg/dL (ref 0.2–1.2)
Total Protein: 6.3 g/dL (ref 6.0–8.3)

## 2021-01-20 LAB — CBC WITH DIFFERENTIAL/PLATELET
Basophils Absolute: 0.1 10*3/uL (ref 0.0–0.1)
Basophils Relative: 1.4 % (ref 0.0–3.0)
Eosinophils Absolute: 0.1 10*3/uL (ref 0.0–0.7)
Eosinophils Relative: 0.9 % (ref 0.0–5.0)
HCT: 38.5 % (ref 36.0–46.0)
Hemoglobin: 12.8 g/dL (ref 12.0–15.0)
Lymphocytes Relative: 26.3 % (ref 12.0–46.0)
Lymphs Abs: 2.1 10*3/uL (ref 0.7–4.0)
MCHC: 33.3 g/dL (ref 30.0–36.0)
MCV: 88.1 fl (ref 78.0–100.0)
Monocytes Absolute: 0.9 10*3/uL (ref 0.1–1.0)
Monocytes Relative: 11.3 % (ref 3.0–12.0)
Neutro Abs: 4.9 10*3/uL (ref 1.4–7.7)
Neutrophils Relative %: 60.1 % (ref 43.0–77.0)
Platelets: 530 10*3/uL — ABNORMAL HIGH (ref 150.0–400.0)
RBC: 4.37 Mil/uL (ref 3.87–5.11)
RDW: 15.5 % (ref 11.5–15.5)
WBC: 8.1 10*3/uL (ref 4.0–10.5)

## 2021-01-20 LAB — T3, FREE: T3, Free: 4.2 pg/mL (ref 2.3–4.2)

## 2021-01-20 LAB — T4, FREE: Free T4: 0.78 ng/dL (ref 0.60–1.60)

## 2021-01-20 LAB — TSH: TSH: 1.13 u[IU]/mL (ref 0.35–5.50)

## 2021-01-20 NOTE — Patient Instructions (Signed)
Moderate fluids, okay to have salt.  Please stop at the lab to have labs drawn.  Go to [facility]  imaging for chest X-ray.  Make sure to make follow up appt with GI in next few weeks.

## 2021-01-20 NOTE — Assessment & Plan Note (Signed)
SIADH and/or chronic emesis and diarrhea issues causing GI loss of salt.  Limit water, eat salt.   re-eval with labs.

## 2021-01-20 NOTE — Telephone Encounter (Signed)
Pt was recently d/c from the hospital and was defer back to her GI for hyponatremia. Pt wants to be seen sooner but no appt available soon. Pls call her.

## 2021-01-20 NOTE — Assessment & Plan Note (Signed)
Due for re-eval. 

## 2021-01-20 NOTE — Assessment & Plan Note (Signed)
Likely due to COVID viral infection.  Improving.  Follow up with GI.

## 2021-01-20 NOTE — Progress Notes (Signed)
Patient ID: Tiffany Orozco, female    DOB: 28-Feb-1951, 70 y.o.   MRN: [id number]  This visit was conducted in person.  BP 120/60   Pulse 83   Temp 98.1 F (36.7 C) (Temporal)   Ht 5' (1.524 m)   Wt 158 lb (71.7 kg)   SpO2 96%   BMI 30.86 kg/m    CC: Chief Complaint  Patient presents with   Hospitalization Follow-up    Subjective:   [name] is a 70 y.o. female presenting on 01/20/2021 for Hospitalization Follow-up  Reviewed hospital note in detail Admit date: 01/08/2021  Discharge date: 01/12/2021  Copied hospital course for informational purposes.  Intermittent acute on chronic nausea/vomiting/abdominal pain and episodes of diarrhea: Suspect due to COVID-19 infection-superimposed on some chronic/intermittent vomiting and diarrhea (history of IBS-diarrhea predominant). CT abdomen suggestive of enteritis, symptoms much improved after supportive care. COVID-19 infection: No pneumonia-mostly GI symptoms-is post 3 doses of remdesivir.  No pulmonary symptoms.   CXR 8/11 and 8/13 no acute changes.Mild scar atelectasis in lateral left lung base. Hyponatremia: Due to on chronic appears to be SIADH, improved after fluid restriction and Lasix.  Patient and daughter both educated on fluid restriction apparently she drinks a lot of water and sweet tea at home per daughter.  PCP to continue to monitor sodium level improving trend is stable. Hypokalemia: Replaced.   IBS - diarrhea predominant: Followed by GI in [place]-follow-up within 1 week of discharge.   01/20/21 Now day 12 from positive COVID test    Recommended 2 view CXR 1 week at follow up as well as re-eval with cbc and CMET  She reports she does have cough and congestion....started 2 days after discharge. Now mucus is darker.  No face pain, no ear pain.  No SOB, no wheeze.  Nausea better and vomiting/diarrhea resolved. No fever.  Wt Readings from Last 3 Encounters:  01/20/21 158 lb (71.7 kg)  01/09/21  158 lb 11.7 oz (72 kg)  12/03/20 163 lb (73.9 kg)   BP Readings from Last 3 Encounters:  01/20/21 120/60  01/12/21 (!) 101/56  12/03/20 134/60     Relevant past medical, surgical, family and social history reviewed and updated as indicated. Interim medical history since our last visit reviewed. Allergies and medications reviewed and updated. Outpatient Medications Prior to Visit  Medication Sig Dispense Refill   ARMOUR THYROID 90 MG tablet Take one tablet by mouth daily. 90 tablet 3   Cholecalciferol (VITAMIN D3) 125 MCG (5000 UT) TABS Take 5,000 Units by mouth daily.     dipyridamole-aspirin (AGGRENOX) 200-25 MG 12hr capsule Take 1 capsule by mouth 2 (two) times daily. 180 capsule 3   fluticasone (FLONASE) 50 MCG/ACT nasal spray Place 1-2 sprays into both nostrils daily as needed for allergies or rhinitis.     ondansetron (ZOFRAN ODT) 4 MG disintegrating tablet Take 1 tablet (4 mg total) by mouth every 8 (eight) hours as needed for nausea or vomiting. 20 tablet 0   No facility-administered medications prior to visit.     Per HPI unless specifically indicated in ROS section below Review of Systems  Constitutional:  Negative for fatigue and fever.  HENT:  Positive for congestion.   Eyes:  Negative for pain.  Respiratory:  Positive for cough. Negative for shortness of breath.   Cardiovascular:  Negative for chest pain, palpitations and leg swelling.  Gastrointestinal:  Negative for abdominal pain.  Genitourinary:  Negative for dysuria and vaginal bleeding.  Musculoskeletal:  Negative for back pain.  Neurological:  Negative for syncope, light-headedness and headaches.  Psychiatric/Behavioral:  Negative for dysphoric mood.   Objective:  BP 120/60   Pulse 83   Temp 98.1 F (36.7 C) (Temporal)   Ht 5' (1.524 m)   Wt 158 lb (71.7 kg)   SpO2 96%   BMI 30.86 kg/m   Wt Readings from Last 3 Encounters:  01/20/21 158 lb (71.7 kg)  01/09/21 158 lb 11.7 oz (72 kg)  12/03/20 163 lb  (73.9 kg)      Physical Exam Constitutional:      General: She is not in acute distress.    Appearance: Normal appearance. She is well-developed. She is not ill-appearing or toxic-appearing.  HENT:     Head: Normocephalic.     Right Ear: Hearing, tympanic membrane, ear canal and external ear normal. Tympanic membrane is not erythematous, retracted or bulging.     Left Ear: Hearing, tympanic membrane, ear canal and external ear normal. Tympanic membrane is not erythematous, retracted or bulging.     Nose: No mucosal edema or rhinorrhea.     Right Sinus: No maxillary sinus tenderness or frontal sinus tenderness.     Left Sinus: No maxillary sinus tenderness or frontal sinus tenderness.     Mouth/Throat:     Pharynx: Uvula midline.  Eyes:     General: Lids are normal. Lids are everted, no foreign bodies appreciated.     Conjunctiva/sclera: Conjunctivae normal.     Pupils: Pupils are equal, round, and reactive to light.  Neck:     Thyroid: No thyroid mass or thyromegaly.     Vascular: No carotid bruit.     Trachea: Trachea normal.  Cardiovascular:     Rate and Rhythm: Normal rate and regular rhythm.     Pulses: Normal pulses.     Heart sounds: Normal heart sounds, S1 normal and S2 normal. No murmur heard.   No friction rub. No gallop.  Pulmonary:     Effort: Pulmonary effort is normal. No tachypnea or respiratory distress.     Breath sounds: Normal breath sounds. No decreased breath sounds, wheezing, rhonchi or rales.  Abdominal:     General: Bowel sounds are normal.     Palpations: Abdomen is soft.     Tenderness: There is generalized abdominal tenderness. There is no right CVA tenderness, left CVA tenderness, guarding or rebound. Negative signs include Murphy's sign and McBurney's sign.  Musculoskeletal:     Cervical back: Normal range of motion and neck supple.  Skin:    General: Skin is warm and dry.     Findings: No rash.  Neurological:     Mental Status: She is alert.   Psychiatric:        Mood and Affect: Mood is not anxious or depressed.        Speech: Speech normal.        Behavior: Behavior normal. Behavior is cooperative.        Thought Content: Thought content normal.        Judgment: Judgment normal.      Results for orders placed or performed during the hospital encounter of 01/08/21  Resp Panel by RT-PCR (Flu A&B, Covid) Nasopharyngeal Swab   Specimen: Nasopharyngeal Swab; Nasopharyngeal(NP) swabs in vial transport medium  Result Value Ref Range   SARS Coronavirus 2 by RT PCR POSITIVE (A) NEGATIVE   Influenza A by PCR NEGATIVE NEGATIVE   Influenza B by PCR NEGATIVE NEGATIVE  Lipase, blood  Result Value Ref Range   Lipase 28 11 - 51 U/L  Comprehensive metabolic panel  Result Value Ref Range   Sodium 127 (L) 135 - 145 mmol/L   Potassium 3.8 3.5 - 5.1 mmol/L   Chloride 96 (L) 98 - 111 mmol/L   CO2 22 22 - 32 mmol/L   Glucose, Bld 117 (H) 70 - 99 mg/dL   BUN 12 8 - 23 mg/dL   Creatinine, Ser 0.57 0.44 - 1.00 mg/dL   Calcium 8.9 8.9 - 10.3 mg/dL   Total Protein 7.1 6.5 - 8.1 g/dL   Albumin 4.0 3.5 - 5.0 g/dL   AST 18 15 - 41 U/L   ALT 19 0 - 44 U/L   Alkaline Phosphatase 96 38 - 126 U/L   Total Bilirubin 0.7 0.3 - 1.2 mg/dL   GFR, Estimated >60 >60 mL/min   Anion gap 9 5 - 15  CBC  Result Value Ref Range   WBC 5.0 4.0 - 10.5 K/uL   RBC 5.01 3.87 - 5.11 MIL/uL   Hemoglobin 15.0 12.0 - 15.0 g/dL   HCT 42.6 36.0 - 46.0 %   MCV 85.0 80.0 - 100.0 fL   MCH 29.9 26.0 - 34.0 pg   MCHC 35.2 30.0 - 36.0 g/dL   RDW 14.6 11.5 - 15.5 %   Platelets 295 150 - 400 K/uL   nRBC 0.0 0.0 - 0.2 %  Ferritin  Result Value Ref Range   Ferritin 35 11 - 307 ng/mL  D-dimer, quantitative  Result Value Ref Range   D-Dimer, Quant 0.92 (H) 0.00 - 0.50 ug/mL-FEU  Comprehensive metabolic panel  Result Value Ref Range   Sodium 128 (L) 135 - 145 mmol/L   Potassium 3.0 (L) 3.5 - 5.1 mmol/L   Chloride 97 (L) 98 - 111 mmol/L   CO2 23 22 - 32 mmol/L    Glucose, Bld 114 (H) 70 - 99 mg/dL   BUN 8 8 - 23 mg/dL   Creatinine, Ser 0.56 0.44 - 1.00 mg/dL   Calcium 8.4 (L) 8.9 - 10.3 mg/dL   Total Protein 6.8 6.5 - 8.1 g/dL   Albumin 3.7 3.5 - 5.0 g/dL   AST 22 15 - 41 U/L   ALT 20 0 - 44 U/L   Alkaline Phosphatase 93 38 - 126 U/L   Total Bilirubin 0.6 0.3 - 1.2 mg/dL   GFR, Estimated >60 >60 mL/min   Anion gap 8 5 - 15  CBC with Differential/Platelet  Result Value Ref Range   WBC 5.4 4.0 - 10.5 K/uL   RBC 5.20 (H) 3.87 - 5.11 MIL/uL   Hemoglobin 15.2 (H) 12.0 - 15.0 g/dL   HCT 43.8 36.0 - 46.0 %   MCV 84.2 80.0 - 100.0 fL   MCH 29.2 26.0 - 34.0 pg   MCHC 34.7 30.0 - 36.0 g/dL   RDW 14.4 11.5 - 15.5 %   Platelets 276 150 - 400 K/uL   nRBC 0.0 0.0 - 0.2 %   Neutrophils Relative % 77 %   Neutro Abs 4.2 1.7 - 7.7 K/uL   Lymphocytes Relative 11 %   Lymphs Abs 0.6 (L) 0.7 - 4.0 K/uL   Monocytes Relative 11 %   Monocytes Absolute 0.6 0.1 - 1.0 K/uL   Eosinophils Relative 0 %   Eosinophils Absolute 0.0 0.0 - 0.5 K/uL   Basophils Relative 0 %   Basophils Absolute 0.0 0.0 - 0.1 K/uL   Immature Granulocytes 1 %  Abs Immature Granulocytes 0.03 0.00 - 0.07 K/uL  Procalcitonin - Baseline  Result Value Ref Range   Procalcitonin <0.10 ng/mL  Procalcitonin  Result Value Ref Range   Procalcitonin <0.10 ng/mL  Comprehensive metabolic panel  Result Value Ref Range   Sodium 127 (L) 135 - 145 mmol/L   Potassium 4.0 3.5 - 5.1 mmol/L   Chloride 94 (L) 98 - 111 mmol/L   CO2 24 22 - 32 mmol/L   Glucose, Bld 105 (H) 70 - 99 mg/dL   BUN 15 8 - 23 mg/dL   Creatinine, Ser 0.56 0.44 - 1.00 mg/dL   Calcium 8.5 (L) 8.9 - 10.3 mg/dL   Total Protein 6.2 (L) 6.5 - 8.1 g/dL   Albumin 3.4 (L) 3.5 - 5.0 g/dL   AST 25 15 - 41 U/L   ALT 20 0 - 44 U/L   Alkaline Phosphatase 78 38 - 126 U/L   Total Bilirubin 0.6 0.3 - 1.2 mg/dL   GFR, Estimated >60 >60 mL/min   Anion gap 9 5 - 15  CBC with Differential/Platelet  Result Value Ref Range   WBC 6.4 4.0 -  10.5 K/uL   RBC 5.21 (H) 3.87 - 5.11 MIL/uL   Hemoglobin 15.4 (H) 12.0 - 15.0 g/dL   HCT 43.6 36.0 - 46.0 %   MCV 83.7 80.0 - 100.0 fL   MCH 29.6 26.0 - 34.0 pg   MCHC 35.3 30.0 - 36.0 g/dL   RDW 14.5 11.5 - 15.5 %   Platelets 275 150 - 400 K/uL   nRBC 0.0 0.0 - 0.2 %   Neutrophils Relative % 72 %   Neutro Abs 4.6 1.7 - 7.7 K/uL   Lymphocytes Relative 19 %   Lymphs Abs 1.2 0.7 - 4.0 K/uL   Monocytes Relative 9 %   Monocytes Absolute 0.6 0.1 - 1.0 K/uL   Eosinophils Relative 0 %   Eosinophils Absolute 0.0 0.0 - 0.5 K/uL   Basophils Relative 0 %   Basophils Absolute 0.0 0.0 - 0.1 K/uL   Immature Granulocytes 0 %   Abs Immature Granulocytes 0.02 0.00 - 0.07 K/uL  Magnesium  Result Value Ref Range   Magnesium 1.9 1.7 - 2.4 mg/dL  Urinalysis, Complete w Microscopic Urine, Clean Catch  Result Value Ref Range   Color, Urine YELLOW (A) YELLOW   APPearance CLOUDY (A) CLEAR   Specific Gravity, Urine 1.024 1.005 - 1.030   pH 6.0 5.0 - 8.0   Glucose, UA NEGATIVE NEGATIVE mg/dL   Hgb urine dipstick MODERATE (A) NEGATIVE   Bilirubin Urine NEGATIVE NEGATIVE   Ketones, ur 20 (A) NEGATIVE mg/dL   Protein, ur 30 (A) NEGATIVE mg/dL   Nitrite NEGATIVE NEGATIVE   Leukocytes,Ua NEGATIVE NEGATIVE   RBC / HPF 0-5 0 - 5 RBC/hpf   WBC, UA 0-5 0 - 5 WBC/hpf   Bacteria, UA NONE SEEN NONE SEEN   Squamous Epithelial / LPF 11-20 0 - 5   Mucus PRESENT    Hyaline Casts, UA PRESENT   Osmolality, urine  Result Value Ref Range   Osmolality, Ur 769 300 - 900 mOsm/kg  Sodium, urine, random  Result Value Ref Range   Sodium, Ur 63 mmol/L  Brain natriuretic peptide  Result Value Ref Range   B Natriuretic Peptide 32.1 0.0 - 100.0 pg/mL  Uric acid  Result Value Ref Range   Uric Acid, Serum 3.4 2.5 - 7.1 mg/dL  CBC with Differential/Platelet  Result Value Ref Range   WBC 6.1  4.0 - 10.5 K/uL   RBC 5.13 (H) 3.87 - 5.11 MIL/uL   Hemoglobin 15.5 (H) 12.0 - 15.0 g/dL   HCT 43.3 36.0 - 46.0 %   MCV 84.4  80.0 - 100.0 fL   MCH 30.2 26.0 - 34.0 pg   MCHC 35.8 30.0 - 36.0 g/dL   RDW 14.5 11.5 - 15.5 %   Platelets 276 150 - 400 K/uL   nRBC 0.0 0.0 - 0.2 %   Neutrophils Relative % 72 %   Neutro Abs 4.4 1.7 - 7.7 K/uL   Lymphocytes Relative 18 %   Lymphs Abs 1.1 0.7 - 4.0 K/uL   Monocytes Relative 10 %   Monocytes Absolute 0.6 0.1 - 1.0 K/uL   Eosinophils Relative 0 %   Eosinophils Absolute 0.0 0.0 - 0.5 K/uL   Basophils Relative 0 %   Basophils Absolute 0.0 0.0 - 0.1 K/uL   Immature Granulocytes 0 %   Abs Immature Granulocytes 0.02 0.00 - 0.07 K/uL  Comprehensive metabolic panel  Result Value Ref Range   Sodium 129 (L) 135 - 145 mmol/L   Potassium 3.5 3.5 - 5.1 mmol/L   Chloride 95 (L) 98 - 111 mmol/L   CO2 23 22 - 32 mmol/L   Glucose, Bld 101 (H) 70 - 99 mg/dL   BUN 26 (H) 8 - 23 mg/dL   Creatinine, Ser 0.88 0.44 - 1.00 mg/dL   Calcium 8.5 (L) 8.9 - 10.3 mg/dL   Total Protein 6.4 (L) 6.5 - 8.1 g/dL   Albumin 3.6 3.5 - 5.0 g/dL   AST 24 15 - 41 U/L   ALT 19 0 - 44 U/L   Alkaline Phosphatase 73 38 - 126 U/L   Total Bilirubin 0.7 0.3 - 1.2 mg/dL   GFR, Estimated >60 >60 mL/min   Anion gap 11 5 - 15  Brain natriuretic peptide  Result Value Ref Range   B Natriuretic Peptide 27.6 0.0 - 100.0 pg/mL  Magnesium  Result Value Ref Range   Magnesium 2.1 1.7 - 2.4 mg/dL  Osmolality  Result Value Ref Range   Osmolality 276 275 - 295 mOsm/kg  CBC with Differential/Platelet  Result Value Ref Range   WBC 8.1 4.0 - 10.5 K/uL   RBC 5.26 (H) 3.87 - 5.11 MIL/uL   Hemoglobin 15.5 (H) 12.0 - 15.0 g/dL   HCT 44.1 36.0 - 46.0 %   MCV 83.8 80.0 - 100.0 fL   MCH 29.5 26.0 - 34.0 pg   MCHC 35.1 30.0 - 36.0 g/dL   RDW 14.6 11.5 - 15.5 %   Platelets 346 150 - 400 K/uL   nRBC 0.0 0.0 - 0.2 %   Neutrophils Relative % 64 %   Neutro Abs 5.1 1.7 - 7.7 K/uL   Lymphocytes Relative 24 %   Lymphs Abs 1.9 0.7 - 4.0 K/uL   Monocytes Relative 9 %   Monocytes Absolute 0.8 0.1 - 1.0 K/uL   Eosinophils  Relative 2 %   Eosinophils Absolute 0.2 0.0 - 0.5 K/uL   Basophils Relative 0 %   Basophils Absolute 0.0 0.0 - 0.1 K/uL   Immature Granulocytes 1 %   Abs Immature Granulocytes 0.04 0.00 - 0.07 K/uL  Type and screen  Result Value Ref Range   ABO/RH(D) A POS    Antibody Screen NEG    Sample Expiration      01/13/2021,2359 Performed at [facility], [address], [place], [place] [address]     This visit  occurred during the SARS-CoV-2 public health emergency.  Safety protocols were in place, including screening questions prior to the visit, additional usage of staff PPE, and extensive cleaning of exam room while observing appropriate contact time as indicated for disinfecting solutions.   COVID 19 screen:  No recent travel or known exposure to COVID19 The patient denies respiratory symptoms of COVID 19 at this time. The importance of social distancing was discussed today.   Assessment and Plan    Problem List Items Addressed This Visit     COVID-19 - Primary    Resolving GI symptoms, some continue congestion now with new color to mucus ( no SOB or fever, so doubt bacterial super-infeciton).Marland Kitchen still has taste changed.  Will check cbc and CXR for re-eval and to rule out new bacterial infeciton.      Relevant Orders   Comprehensive metabolic panel   DG Chest 2 View   Enteritis    Likely due to COVID viral infection.  Improving.  Follow up with GI.      Hyponatremia     SIADH and/or chronic emesis and diarrhea issues causing GI loss of salt.  Limit water, eat salt.   re-eval with labs.      Relevant Orders   CBC with Differential/Platelet   Hypothyroidism    Due for re-eval.      Relevant Orders   TSH   T4, free   T3, free     [name], MD

## 2021-01-20 NOTE — Assessment & Plan Note (Signed)
Resolving GI symptoms, some continue congestion now with new color to mucus ( no SOB or fever, so doubt bacterial super-infeciton).Marland Kitchen still has taste changed.  Will check cbc and CXR for re-eval and to rule out new bacterial infeciton.

## 2021-01-21 ENCOUNTER — Ambulatory Visit
Admission: RE | Admit: 2021-01-21 | Discharge: 2021-01-21 | Disposition: A | Discharge status: Home / Self Care | Payer: Medicare Other | Attending: Family Medicine | Admitting: Family Medicine

## 2021-01-21 ENCOUNTER — Ambulatory Visit
Admission: RE | Admit: 2021-01-21 | Discharge: 2021-01-21 | Disposition: A | Discharge status: Home / Self Care | Payer: Medicare Other | Source: Ambulatory Visit | Attending: Family Medicine | Admitting: Family Medicine

## 2021-01-21 ENCOUNTER — Other Ambulatory Visit: Payer: Self-pay

## 2021-01-21 DIAGNOSIS — R059 Cough, unspecified: Secondary | ICD-10-CM | POA: Diagnosis not present

## 2021-01-21 DIAGNOSIS — U071 COVID-19: Secondary | ICD-10-CM | POA: Insufficient documentation

## 2021-01-21 NOTE — Telephone Encounter (Signed)
PCP does want her to follow up with GI. Appointment found for her. She is dependent on others for transportation. She will call back if she is unsuccessful in finding a ride.

## 2021-01-27 ENCOUNTER — Encounter: Payer: Medicare Other | Admitting: Gastroenterology

## 2021-01-28 ENCOUNTER — Telehealth: Payer: Self-pay

## 2021-01-28 NOTE — Telephone Encounter (Signed)
Pt left v/m requesting cb about CXR report on 01/21/21. Pt does not have smart phone or computer and request phone call. Sending note to [facility] CMA.

## 2021-01-28 NOTE — Telephone Encounter (Signed)
Ms. Masterman notified by telephone that her CXR showed no active cardiopulmonary disease.

## 2021-02-05 ENCOUNTER — Other Ambulatory Visit: Payer: Self-pay

## 2021-02-05 ENCOUNTER — Ambulatory Visit
Admission: RE | Admit: 2021-02-05 | Discharge: 2021-02-05 | Disposition: A | Discharge status: Home / Self Care | Payer: Medicare Other | Source: Ambulatory Visit | Attending: Acute Care | Admitting: Acute Care

## 2021-02-05 DIAGNOSIS — F17210 Nicotine dependence, cigarettes, uncomplicated: Secondary | ICD-10-CM | POA: Insufficient documentation

## 2021-02-05 DIAGNOSIS — Z87891 Personal history of nicotine dependence: Secondary | ICD-10-CM | POA: Insufficient documentation

## 2021-02-06 ENCOUNTER — Ambulatory Visit: Payer: Medicare Other | Admitting: Gastroenterology

## 2021-02-06 DIAGNOSIS — R3989 Other symptoms and signs involving the genitourinary system: Secondary | ICD-10-CM | POA: Insufficient documentation

## 2021-02-06 NOTE — Assessment & Plan Note (Signed)
Chronic issue for her.. followed by GI.

## 2021-02-06 NOTE — Assessment & Plan Note (Signed)
Pt has upcoming dental visit.

## 2021-02-06 NOTE — Assessment & Plan Note (Signed)
UA clear, no S/S of infeciton... likely due to hyperconcentration.

## 2021-02-06 NOTE — Assessment & Plan Note (Signed)
Limit water and increase sodium intake.   Due for re-eval.

## 2021-02-12 DIAGNOSIS — Z1231 Encounter for screening mammogram for malignant neoplasm of breast: Secondary | ICD-10-CM | POA: Diagnosis not present

## 2021-02-12 LAB — HM MAMMOGRAPHY

## 2021-02-13 ENCOUNTER — Encounter: Payer: Self-pay | Admitting: Family Medicine

## 2021-02-16 ENCOUNTER — Encounter: Payer: Self-pay | Admitting: Family Medicine

## 2021-02-16 ENCOUNTER — Other Ambulatory Visit: Payer: Self-pay | Admitting: *Deleted

## 2021-02-16 DIAGNOSIS — F17210 Nicotine dependence, cigarettes, uncomplicated: Secondary | ICD-10-CM

## 2021-02-16 DIAGNOSIS — I700 Atherosclerosis of aorta: Secondary | ICD-10-CM | POA: Insufficient documentation

## 2021-02-16 DIAGNOSIS — Z87891 Personal history of nicotine dependence: Secondary | ICD-10-CM

## 2021-03-01 ENCOUNTER — Other Ambulatory Visit: Payer: Self-pay | Admitting: Family Medicine

## 2021-03-02 DIAGNOSIS — H903 Sensorineural hearing loss, bilateral: Secondary | ICD-10-CM | POA: Diagnosis not present

## 2021-03-02 DIAGNOSIS — H90A31 Mixed conductive and sensorineural hearing loss, unilateral, right ear with restricted hearing on the contralateral side: Secondary | ICD-10-CM | POA: Diagnosis not present

## 2021-03-02 DIAGNOSIS — H6983 Other specified disorders of Eustachian tube, bilateral: Secondary | ICD-10-CM | POA: Diagnosis not present

## 2021-03-02 DIAGNOSIS — H6501 Acute serous otitis media, right ear: Secondary | ICD-10-CM | POA: Diagnosis not present

## 2021-03-03 ENCOUNTER — Other Ambulatory Visit: Payer: Self-pay | Admitting: Family Medicine

## 2021-03-05 DIAGNOSIS — L298 Other pruritus: Secondary | ICD-10-CM | POA: Diagnosis not present

## 2021-03-05 DIAGNOSIS — L218 Other seborrheic dermatitis: Secondary | ICD-10-CM | POA: Diagnosis not present

## 2021-03-05 DIAGNOSIS — L814 Other melanin hyperpigmentation: Secondary | ICD-10-CM | POA: Diagnosis not present

## 2021-03-05 DIAGNOSIS — L570 Actinic keratosis: Secondary | ICD-10-CM | POA: Diagnosis not present

## 2021-03-17 ENCOUNTER — Encounter: Payer: Self-pay | Admitting: General Surgery

## 2021-03-31 ENCOUNTER — Other Ambulatory Visit: Payer: Self-pay | Admitting: Family Medicine

## 2021-04-03 ENCOUNTER — Ambulatory Visit: Payer: Medicare Other

## 2021-04-05 NOTE — Progress Notes (Signed)
Subjective:   Tiffany Orozco is a 70 y.o. female who presents for Medicare Annual (Subsequent) preventive examination.  I connected with [name] today by telephone and verified that I am speaking with the correct person using two identifiers. Location patient: home Location provider: work Persons participating in the virtual visit: patient, Marine scientist.    I discussed the limitations, risks, security and privacy concerns of performing an evaluation and management service by telephone and the availability of in person appointments. I also discussed with the patient that there may be a patient responsible charge related to this service. The patient expressed understanding and verbally consented to this telephonic visit.    Interactive audio and video telecommunications were attempted between this provider and patient, however failed, due to patient having technical difficulties OR patient did not have access to video capability.  We continued and completed visit with audio only.  Some vital signs may be absent or patient reported.   Time Spent with patient on telephone encounter: 35 minutes  Review of Systems     Cardiac Risk Factors include: advanced age (>77men, >4 women);hypertension;dyslipidemia     Objective:    Today's Vitals   04/09/21 0944  Weight: 158 lb (71.7 kg)  Height: 5' (1.524 m)   Body mass index is 30.86 kg/m.  Advanced Directives 04/09/2021 01/08/2021 01/08/2021 01/08/2021 11/29/2020 11/28/2020 04/20/2020  Does Patient Have a Medical Advance Directive? No - No No No No No  Does patient want to make changes to medical advance directive? - - - - - - -  Would patient like information on creating a medical advance directive? Yes (MAU/Ambulatory/Procedural Areas - Information given) No - Patient declined - - - No - Patient declined No - Patient declined    Current Medications (verified) Outpatient Encounter Medications as of 04/09/2021  Medication Sig   ARMOUR  THYROID 90 MG tablet TAKE 1 TABLET BY MOUTH EVERY DAY   Cholecalciferol (VITAMIN D3) 125 MCG (5000 UT) TABS Take 5,000 Units by mouth daily.   dipyridamole-aspirin (AGGRENOX) 200-25 MG 12hr capsule Take 1 capsule by mouth 2 (two) times daily. NEEDS MEDICARE WELLNESS EXAM   fluticasone (FLONASE) 50 MCG/ACT nasal spray SPRAY 2 SPRAYS INTO EACH NOSTRIL EVERY DAY   ondansetron (ZOFRAN ODT) 4 MG disintegrating tablet Take 1 tablet (4 mg total) by mouth every 8 (eight) hours as needed for nausea or vomiting. (Patient not taking: Reported on 04/09/2021)   No facility-administered encounter medications on file as of 04/09/2021.    Allergies (verified) Lisinopril, Niacin, Welchol [colesevelam hcl], Augmentin [amoxicillin-pot clavulanate], Codeine, and Oxycodone hcl   History: Past Medical History:  Diagnosis Date   Allergy    Anxiety    COPD (chronic obstructive pulmonary disease) (HCC)    DDD (degenerative disc disease), lumbar    mild   Depression    Female rectocele without uterine prolapse    GERD (gastroesophageal reflux disease)    Hearing loss    Heart murmur    Hyperlipidemia    Hypertension    Hypothyroidism    IBS (irritable bowel syndrome)    Influenza A 06/2015   Obesity    Osteopenia    Plantar fasciitis    Pneumonia    Skin cancer of arm    Skin cancer of face    Stroke ([place]) 07/2010   no permenant deficits   Thrombocytopenia ([place])    Tubal pregnancy    Past Surgical History:  Procedure Laterality Date   APPENDECTOMY  CHOLECYSTECTOMY  2004   COLONOSCOPY     CYST EXCISION Right    breast mole   EMGs  7/04   atms and wrists neg   ESOPHAGEAL MANOMETRY N/A 03/17/2015   Procedure: ESOPHAGEAL MANOMETRY (EM);  Surgeon: Manus Gunning, MD;  Location: [facility];  Service: Gastroenterology;  Laterality: N/A;   FOOT SURGERY Right 2008, 2009, 2010   torn ligament, hammertoes   TUBAL LIGATION  1975   UPPER GI ENDOSCOPY     XI ROBOTIC ASSISTED VENTRAL HERNIA  N/A 04/14/2020   Procedure: XI ROBOTIC ASSISTED VENTRAL HERNIA;  Surgeon: Fredirick Maudlin, MD;  Location: [facility];  Service: General;  Laterality: N/A;   Family History  Problem Relation Age of Onset   Skin cancer Father    Hypertension Mother    Lung cancer Brother    Colon cancer Sister    Esophageal cancer Sister    Ovarian cancer Sister    Stomach cancer Neg Hx    Pancreatic cancer Neg Hx    Social History   Socioeconomic History   Marital status: Legally Separated    Spouse name: Arli Bree   Number of children: 2   Years of education: Not on file   Highest education level: Not on file  Occupational History   Occupation: bookkeeper    Fish farm manager: UNEMPLOYED    Comment: 10/10 not working x 9 years  Tobacco Use   Smoking status: Every Day    Packs/day: 0.75    Years: 31.00    Pack years: 23.25    Types: Cigarettes   Smokeless tobacco: Never   Tobacco comments:    States she is not smoking during current illness 05/29/18 sb  Vaping Use   Vaping Use: Never used  Substance and Sexual Activity   Alcohol use: No    Alcohol/week: 0.0 standard drinks   Drug use: No   Sexual activity: Yes    Birth control/protection: Post-menopausal  Other Topics Concern   Not on file  Social History Narrative   71/10 husband had open heart surgery, not working, 2 grandchildren, siblings living and healthy   Social Determinants of Health   Financial Resource Strain: High Risk   Difficulty of Paying Living Expenses: Hard  Food Insecurity: Food Insecurity Present   Worried About Charity fundraiser in the Last Year: Sometimes true   Ran Out of Food in the Last Year: Sometimes true  Transportation Needs: No Transportation Needs   Lack of Transportation (Medical): No   Lack of Transportation (Non-Medical): No  Physical Activity: Sufficiently Active   Days of Exercise per Week: 7 days   Minutes of Exercise per Session: 30 min  Stress: No Stress Concern Present   Feeling of  Stress : Not at all  Social Connections: Socially Isolated   Frequency of Communication with Friends and Family: More than three times a week   Frequency of Social Gatherings with Friends and Family: More than three times a week   Attends Religious Services: Never   Marine scientist or Organizations: No   Attends Archivist Meetings: Never   Marital Status: Separated    Tobacco Counseling Ready to quit: Not Answered Counseling given: Not Answered Tobacco comments: States she is not smoking during current illness 05/29/18 sb   Clinical Intake:  Pre-visit preparation completed: Yes  Pain : No/denies pain     BMI - recorded: 30.86 Nutritional Status: BMI > 30  Obese Nutritional Risks: None Diabetes: No  How often do you need to have someone help you when you read instructions, pamphlets, or other written materials from your doctor or pharmacy?: 1 - Never  Diabetic?No  Interpreter Needed?: No  Information entered by :: Marlin Brys :Nerida Boivin LPN   Activities of Daily Living In your present state of health, do you have any difficulty performing the following activities: 04/09/2021 01/08/2021  Hearing? Y N  Comment decrease hearing in both ears -  Vision? N N  Difficulty concentrating or making decisions? N N  Walking or climbing stairs? N N  Dressing or bathing? N N  Doing errands, shopping? N N  Preparing Food and eating ? N -  Using the Toilet? N -  In the past six months, have you accidently leaked urine? Y -  Comment occasional -  Do you have problems with loss of bowel control? N -  Managing your Medications? N -  Managing your Finances? Y -  Housekeeping or managing your Housekeeping? N -  Some recent data might be hidden    Patient Care Team: Jinny Sanders, MD as PCP - General  Indicate any recent Medical Services you may have received from other than [facility] providers in the past year (date may be approximate).     Assessment:   This is a  routine wellness examination for Tiffany Orozco.  Hearing/Vision screen Hearing Screening - Comments:: Decrease hearing in both ears Vision Screening - Comments:: Last exam 10/2020, Dr Berkley Harvey, wears glasses  Dietary issues and exercise activities discussed: Current Exercise Habits: Home exercise routine, Type of exercise: walking, Time (Minutes): 30, Frequency (Times/Week): 7, Weekly Exercise (Minutes/Week): 210, Intensity: Moderate   Goals Addressed             This Visit's Progress    Patient Stated       Would like to get teeth fixed due to previous illness.       Depression Screen PHQ 2/9 Scores 04/09/2021 02/29/2020 12/26/2018 11/07/2018 12/14/2017 06/10/2017 11/03/2016  PHQ - 2 Score 0 2 0 0 1 5 0  PHQ- 9 Score - 4 - 1 5 19  -    Fall Risk Fall Risk  04/09/2021 03/18/2020 02/29/2020 04/25/2019 12/26/2018  Falls in the past year? 0 0 1 0 0  Comment - - - Emmi Telephone Survey: data to providers prior to load -  Number falls in past yr: 0 0 0 - -  Injury with Fall? 0 0 0 - -  Risk for fall due to : No Fall Risks - - - -  Follow up Falls prevention discussed - - - -    FALL RISK PREVENTION PERTAINING TO THE HOME:  Any stairs in or around the home? Yes  If so, are there any without handrails? Yes  Home free of loose throw rugs in walkways, pet beds, electrical cords, etc? Yes  Adequate lighting in your home to reduce risk of falls? Yes   ASSISTIVE DEVICES UTILIZED TO PREVENT FALLS:  Life alert? No  Use of a cane, walker or w/c? No  Grab bars in the bathroom? No  Shower chair or bench in shower? Yes  Elevated toilet seat or a handicapped toilet? Yes   TIMED UP AND GO:  Was the test performed? No , visit completed over the phone.   Cognitive Function: Normal cognitive status assessed by this Nurse Health Advisor. No abnormalities found.   MMSE - Mini Mental State Exam 12/14/2017 11/03/2016  Orientation to time 5 5  Orientation to Place  5 5  Registration 3 3  Attention/  Calculation 0 0  Recall 3 2  Recall-comments - pt was unable to recall 1 of 3 words  Language- name 2 objects 0 0  Language- repeat 1 1  Language- follow 3 step command 3 3  Language- read & follow direction 0 0  Write a sentence 0 0  Copy design 0 0  Total score 20 19        Immunizations Immunization History  Administered Date(s) Administered   Fluad Quad(high Dose 65+) 03/27/2019   Influenza Split 02/18/2011, 03/03/2012   Influenza Whole 03/06/2007, 03/11/2008, 03/06/2009, 02/24/2010   Influenza, High Dose Seasonal PF 03/16/2018   Influenza,inj,Quad PF,6+ Mos 02/16/2013, 03/22/2014, 02/25/2015, 04/29/2016, 03/03/2017   PFIZER(Purple Top)SARS-COV-2 Vaccination 07/27/2019, 08/21/2019   Pneumococcal Conjugate-13 10/15/2014   Pneumococcal Polysaccharide-23 10/16/2013   Td 03/11/1997, 03/06/2009   Zoster, Live 05/18/2011    TDAP status: Due, Education has been provided regarding the importance of this vaccine. Advised may receive this vaccine at local pharmacy or Health Dept. Aware to provide a copy of the vaccination record if obtained from local pharmacy or Health Dept. Verbalized acceptance and understanding.  Flu Vaccine status: Due, Education has been provided regarding the importance of this vaccine. Advised may receive this vaccine at local pharmacy or Health Dept. Aware to provide a copy of the vaccination record if obtained from local pharmacy or Health Dept. Verbalized acceptance and understanding.  Pneumococcal vaccine status: Up to date  Covid-19 vaccine status: Information provided on how to obtain vaccines.   Qualifies for Shingles Vaccine? Yes   Zostavax completed Yes   Shingrix Completed?: No.    Education has been provided regarding the importance of this vaccine. Patient has been advised to call insurance company to determine out of pocket expense if they have not yet received this vaccine. Advised may also receive vaccine at local pharmacy or Health Dept.  Verbalized acceptance and understanding.  Screening Tests Health Maintenance  Topic Date Due   Zoster Vaccines- Shingrix (1 of 2) Never done   TETANUS/TDAP  03/07/2019   COVID-19 Vaccine (3 - Booster for Pfizer series) 10/16/2019   COLONOSCOPY (Pts 45-74yrs Insurance coverage will need to be confirmed)  05/05/2020   INFLUENZA VACCINE  12/29/2020   MAMMOGRAM  02/13/2023   Pneumonia Vaccine 30+ Years old  Completed   DEXA SCAN  Completed   Hepatitis C Screening  Completed   HPV VACCINES  Aged Out    Health Maintenance  Health Maintenance Due  Topic Date Due   Zoster Vaccines- Shingrix (1 of 2) Never done   TETANUS/TDAP  03/07/2019   COVID-19 Vaccine (3 - Booster for [place] series) 10/16/2019   COLONOSCOPY (Pts 45-34yrs Insurance coverage will need to be confirmed)  05/05/2020   INFLUENZA VACCINE  12/29/2020    Colorectal Cancer screening: Due, last completed 05/05/10, patient plans to discuss with PCP  Mammogram status: Completed 02/12/21. Repeat every year  Bone Density status: Due, patient would like to schedule next year with mammogram appointment.  Lung Cancer Screening: (Low Dose CT Chest recommended if Age 35-80 years, 30 pack-year currently smoking OR have quit w/in 15years.)  does qualify, patient completed lung scan on 02/05/21    Additional Screening:  Hepatitis C Screening: does qualify; Completed 04/15/15  Vision Screening: Recommended annual ophthalmology exams for early detection of glaucoma and other disorders of the eye. Is the patient up to date with their annual eye exam?  Yes  Who is the provider  or what is the name of the office in which the patient attends annual eye exams? Dr. Berkley Harvey   Dental Screening: Recommended annual dental exams for proper oral hygiene  Community Resource Referral / Chronic Care Management: CRR required this visit?  Yes   CCM required this visit?  No      Plan:     I have personally reviewed and noted the following in  the patient's chart:   Medical and social history Use of alcohol, tobacco or illicit drugs  Current medications and supplements including opioid prescriptions.  Functional ability and status Nutritional status Physical activity Advanced directives List of other physicians Hospitalizations, surgeries, and ER visits in previous 12 months Vitals Screenings to include cognitive, depression, and falls Referrals and appointments  In addition, I have reviewed and discussed with patient certain preventive protocols, quality metrics, and best practice recommendations. A written personalized care plan for preventive services as well as general preventive health recommendations were provided to patient.   Due to this being a telephonic visit, the after visit summary with patients personalized plan was offered to patient via mail or my-chart. Patient preferred to pick up at office at next visit.   Loma Messing, LPN   [id number]   Nurse Health Advisor  Nurse Notes: none

## 2021-04-09 ENCOUNTER — Ambulatory Visit (INDEPENDENT_AMBULATORY_CARE_PROVIDER_SITE_OTHER): Payer: Medicare Other

## 2021-04-09 VITALS — Ht 60.0 in | Wt 158.0 lb

## 2021-04-09 DIAGNOSIS — Z0000 Encounter for general adult medical examination without abnormal findings: Secondary | ICD-10-CM

## 2021-04-09 NOTE — Patient Instructions (Signed)
Tiffany Orozco , Thank you for taking time to complete your Medicare Wellness Visit. I appreciate your ongoing commitment to your health goals. Please review the following plan we discussed and let me know if I can assist you in the future.   Screening recommendations/referrals: Colonoscopy: Due, last completed 05/05/10, you plan to discuss with PCP Mammogram: up to date, completed 02/12/21, Due 02/12/22 Bone Density: Due, last completed 12/18/18, would like to schedule in 2023 Recommended yearly ophthalmology/optometry visit for glaucoma screening and checkup Recommended yearly dental visit for hygiene and checkup  Vaccinations: Influenza vaccine: Due-May obtain vaccine at our office or your local pharmacy.  Pneumococcal vaccine: up to date Tdap vaccine: Due- last completed 03/06/09,May obtain vaccine at your local pharmacy.  Shingles vaccine: Discuss with pharmacy    Covid-19: newest booster available at your local pharmacy  Advanced directives: information available at your next appointment   Conditions/risks identified: see problem list  Next appointment: Follow up in one year for your annual wellness visit 04/14/22 @ 9:00am, this will be a telephone visit   Preventive Care 65 Years and Older, Female Preventive care refers to lifestyle choices and visits with your health care provider that can promote health and wellness. What does preventive care include? A yearly physical exam. This is also called an annual well check. Dental exams once or twice a year. Routine eye exams. Ask your health care provider how often you should have your eyes checked. Personal lifestyle choices, including: Daily care of your teeth and gums. Regular physical activity. Eating a healthy diet. Avoiding tobacco and drug use. Limiting alcohol use. Practicing safe sex. Taking low-dose aspirin every day. Taking vitamin and mineral supplements as recommended by your health care provider. What happens during an  annual well check? The services and screenings done by your health care provider during your annual well check will depend on your age, overall health, lifestyle risk factors, and family history of disease. Counseling  Your health care provider may ask you questions about your: Alcohol use. Tobacco use. Drug use. Emotional well-being. Home and relationship well-being. Sexual activity. Eating habits. History of falls. Memory and ability to understand (cognition). Work and work Statistician. Reproductive health. Screening  You may have the following tests or measurements: Height, weight, and BMI. Blood pressure. Lipid and cholesterol levels. These may be checked every 5 years, or more frequently if you are over 23 years old. Skin check. Lung cancer screening. You may have this screening every year starting at age 58 if you have a 30-pack-year history of smoking and currently smoke or have quit within the past 15 years. Fecal occult blood test (FOBT) of the stool. You may have this test every year starting at age 90. Flexible sigmoidoscopy or colonoscopy. You may have a sigmoidoscopy every 5 years or a colonoscopy every 10 years starting at age 71. Hepatitis C blood test. Hepatitis B blood test. Sexually transmitted disease (STD) testing. Diabetes screening. This is done by checking your blood sugar (glucose) after you have not eaten for a while (fasting). You may have this done every 1-3 years. Bone density scan. This is done to screen for osteoporosis. You may have this done starting at age 63. Mammogram. This may be done every 1-2 years. Talk to your health care provider about how often you should have regular mammograms. Talk with your health care provider about your test results, treatment options, and if necessary, the need for more tests. Vaccines  Your health care provider may recommend certain  vaccines, such as: Influenza vaccine. This is recommended every year. Tetanus,  diphtheria, and acellular pertussis (Tdap, Td) vaccine. You may need a Td booster every 10 years. Zoster vaccine. You may need this after age 61. Pneumococcal 13-valent conjugate (PCV13) vaccine. One dose is recommended after age 57. Pneumococcal polysaccharide (PPSV23) vaccine. One dose is recommended after age 6. Talk to your health care provider about which screenings and vaccines you need and how often you need them. This information is not intended to replace advice given to you by your health care provider. Make sure you discuss any questions you have with your health care provider. Document Released: 06/13/2015 Document Revised: 02/04/2016 Document Reviewed: 03/18/2015 Elsevier Interactive Patient Education  2017 [employer] Prevention in the Home Falls can cause injuries. They can happen to people of all ages. There are many things you can do to make your home safe and to help prevent falls. What can I do on the outside of my home? Regularly fix the edges of walkways and driveways and fix any cracks. Remove anything that might make you trip as you walk through a door, such as a raised step or threshold. Trim any bushes or trees on the path to your home. Use bright outdoor lighting. Clear any walking paths of anything that might make someone trip, such as rocks or tools. Regularly check to see if handrails are loose or broken. Make sure that both sides of any steps have handrails. Any raised decks and porches should have guardrails on the edges. Have any leaves, snow, or ice cleared regularly. Use sand or salt on walking paths during winter. Clean up any spills in your garage right away. This includes oil or grease spills. What can I do in the bathroom? Use night lights. Install grab bars by the toilet and in the tub and shower. Do not use towel bars as grab bars. Use non-skid mats or decals in the tub or shower. If you need to sit down in the shower, use a plastic,  non-slip stool. Keep the floor dry. Clean up any water that spills on the floor as soon as it happens. Remove soap buildup in the tub or shower regularly. Attach bath mats securely with double-sided non-slip rug tape. Do not have throw rugs and other things on the floor that can make you trip. What can I do in the bedroom? Use night lights. Make sure that you have a light by your bed that is easy to reach. Do not use any sheets or blankets that are too big for your bed. They should not hang down onto the floor. Have a firm chair that has side arms. You can use this for support while you get dressed. Do not have throw rugs and other things on the floor that can make you trip. What can I do in the kitchen? Clean up any spills right away. Avoid walking on wet floors. Keep items that you use a lot in easy-to-reach places. If you need to reach something above you, use a strong step stool that has a grab bar. Keep electrical cords out of the way. Do not use floor polish or wax that makes floors slippery. If you must use wax, use non-skid floor wax. Do not have throw rugs and other things on the floor that can make you trip. What can I do with my stairs? Do not leave any items on the stairs. Make sure that there are handrails on both sides of the stairs  and use them. Fix handrails that are broken or loose. Make sure that handrails are as long as the stairways. Check any carpeting to make sure that it is firmly attached to the stairs. Fix any carpet that is loose or worn. Avoid having throw rugs at the top or bottom of the stairs. If you do have throw rugs, attach them to the floor with carpet tape. Make sure that you have a light switch at the top of the stairs and the bottom of the stairs. If you do not have them, ask someone to add them for you. What else can I do to help prevent falls? Wear shoes that: Do not have high heels. Have rubber bottoms. Are comfortable and fit you well. Are closed  at the toe. Do not wear sandals. If you use a stepladder: Make sure that it is fully opened. Do not climb a closed stepladder. Make sure that both sides of the stepladder are locked into place. Ask someone to hold it for you, if possible. Clearly mark and make sure that you can see: Any grab bars or handrails. First and last steps. Where the edge of each step is. Use tools that help you move around (mobility aids) if they are needed. These include: Canes. Walkers. Scooters. Crutches. Turn on the lights when you go into a dark area. Replace any light bulbs as soon as they burn out. Set up your furniture so you have a clear path. Avoid moving your furniture around. If any of your floors are uneven, fix them. If there are any pets around you, be aware of where they are. Review your medicines with your doctor. Some medicines can make you feel dizzy. This can increase your chance of falling. Ask your doctor what other things that you can do to help prevent falls. This information is not intended to replace advice given to you by your health care provider. Make sure you discuss any questions you have with your health care provider. Document Released: 03/13/2009 Document Revised: 10/23/2015 Document Reviewed: 06/21/2014 Elsevier Interactive Patient Education  2017 [employer].

## 2021-04-10 ENCOUNTER — Ambulatory Visit (INDEPENDENT_AMBULATORY_CARE_PROVIDER_SITE_OTHER): Payer: Medicare Other

## 2021-04-10 ENCOUNTER — Other Ambulatory Visit: Payer: Self-pay

## 2021-04-10 DIAGNOSIS — Z23 Encounter for immunization: Secondary | ICD-10-CM

## 2021-04-13 DIAGNOSIS — H6983 Other specified disorders of Eustachian tube, bilateral: Secondary | ICD-10-CM | POA: Diagnosis not present

## 2021-04-13 DIAGNOSIS — H903 Sensorineural hearing loss, bilateral: Secondary | ICD-10-CM | POA: Diagnosis not present

## 2021-04-20 ENCOUNTER — Other Ambulatory Visit: Payer: Self-pay | Admitting: Family Medicine

## 2021-04-20 ENCOUNTER — Telehealth: Payer: Self-pay | Admitting: *Deleted

## 2021-04-20 NOTE — Telephone Encounter (Signed)
Spoke with Tiffany Orozco due to Dexilant is not on current medication list.  She states she has started having issues again with belching, nausea, touch of diarrhea and stomach aches.  She has been taking Pepcid but was wondering if going back on the [place] might be better.  She is asking if you can take Dexilent long term. Please advise.

## 2021-04-20 NOTE — Telephone Encounter (Signed)
Tiffany Orozco stated when she was seen back in July/August and had blood drawn, they drew her blood near her wrist and it was painful and her hand/thumb started throbbing. She states it really scared her and she left the office as fast as she could.   Her thumb on one side is still numb.  She is asking what she needs to do or does Dr. Diona Browner thinks the numbness will just eventually go away.  Please advise.

## 2021-04-21 MED ORDER — DEXLANSOPRAZOLE 60 MG PO CPDR: 60.0000 mg | DELAYED_RELEASE_CAPSULE | Freq: Every day | ORAL | 3 refills | Status: DC

## 2021-04-21 NOTE — Telephone Encounter (Signed)
Tiffany Orozco notified as instructed by telephone.  Patient states understanding.  Received refill error from pharmacy so I had to resend Rx.

## 2021-04-21 NOTE — Telephone Encounter (Signed)
Tiffany Orozco notified as instructed by telephone. She states she will just give it more time.

## 2021-04-21 NOTE — Telephone Encounter (Signed)
Left message for Ms. Tiffany Orozco to return my call.

## 2021-04-21 NOTE — Telephone Encounter (Signed)
May have had a nerve injury with blood draw vs something else entirely like carpal tunnel... if due to lab draw peripheral nerves usually heal over time. Nothing else to do.  Can try a carpal tunnel brace at bedtime.

## 2021-04-21 NOTE — Telephone Encounter (Signed)
Call  There are SE to longterm use of dexilant such as osteoporosis... if she needs to restart for 4-6 weeks then taper off... that would be fine. Refilled.

## 2021-04-29 ENCOUNTER — Telehealth: Payer: Self-pay

## 2021-04-29 NOTE — Telephone Encounter (Signed)
   Telephone encounter was:  Successful.  04/29/2021 Name: Tiffany Orozco MRN: [id number] DOB: [birth year]  [name] is a 70 y.o. year old female who is a primary care patient of Bedsole, Amy E, MD . The community resource team was consulted for assistance with [place] guide performed the following interventions: Spoke with patient she is interested in receiving a mailed list of pantries. Verified home address will send resource letter to patient. Patient refused [id number] referral to Meals on Wheels, [place] but did agree to have a list of food pantries mailed to her. Letter saved in Epic.  Follow Up Plan:  Care guide will follow up with patient by phone over the next 9-53 days  Verneda Hollopeter, AAS Paralegal, [facility]  [address], [place] [address] ??[email or web address]  ?? [phone]   [email or web address]

## 2021-05-05 ENCOUNTER — Telehealth: Payer: Self-pay | Admitting: Family Medicine

## 2021-05-05 NOTE — Telephone Encounter (Signed)
Helene Kelp with [facility] called stating that she faxed papers for clearance for pt to have an extraction for pt tooth.Helene Kelp states that pt is on blood thinners. Helene Kelp states that pt is in pain and she is trying to ger her in as soon as tomorrow. Please advise.

## 2021-05-05 NOTE — Telephone Encounter (Signed)
Left message for Tiffany Orozco with [facility] that forms were just faxed back to their office.  I ask that she call me back if she does not receive them.

## 2021-05-06 ENCOUNTER — Telehealth: Payer: Self-pay

## 2021-05-06 NOTE — Telephone Encounter (Signed)
I looked it up... 7-10 days is what is recommended  most recently for procedures where bleeding involved. Given she is having an extraction... there is bleeding risk, although more  minor a procedure. If she has done fine in past prior to procedures with holding 3 days, that is her choice.

## 2021-05-06 NOTE — Telephone Encounter (Signed)
Pt called in requesting a call back . Stated she is having dental work done and was told  to stop taking Rx dipyridamole-aspirin (AGGRENOX) 200-25 MG 12hr capsule as of today .Please Advise [phone]

## 2021-05-06 NOTE — Telephone Encounter (Signed)
Tiffany Orozco notified as instructed by telephone.  Patient states she will hold for  7 days as Dr. Diona Browner recommends.

## 2021-05-06 NOTE — Telephone Encounter (Signed)
   Telephone encounter was:  Successful.  05/06/2021 Name: Tiffany Orozco MRN: [id number] DOB: [birth year]  [name] is a 70 y.o. year old female who is a primary care patient of Bedsole, Amy E, MD . The community resource team was consulted for assistance with [place] guide performed the following interventions:  Spoke wit patient to follow-up about letter sent with food pantry resources.  Patient has not checked her mail box but will check this week.  She has my name and contact number to call. I have also sent the letter to the printer in the [place] bldg to mailed again.    Follow Up Plan:  No further follow up planned at this time. The patient has been provided with needed resources.  Faelynn Wynder, AAS Paralegal, [facility]  [address], [place] [address] ??[email or web address]  ?? [phone]   [email or web address]

## 2021-05-06 NOTE — Telephone Encounter (Signed)
Spoke with Ms. Tiffany Orozco.  She was contacted by the dentist office with instruction to stop her Aggrenox 7 days prior to dental procedure.  She is wanting to know why so long.  In the past the longest she has had to stop it was 3 days.  She is concerned about being off the Aggrenox for so long.  Please advise.

## 2021-05-07 ENCOUNTER — Telehealth: Payer: Self-pay

## 2021-05-07 NOTE — Telephone Encounter (Signed)
   Telephone encounter was:  Successful.  05/07/2021 Name: ZAKYLA TONCHE MRN: [id number] DOB: [birth year]  Julius Bowels Segundo is a 70 y.o. year old female who is a primary care patient of Bedsole, Amy E, MD . The community resource team was consulted for assistance with [place] guide performed the following interventions:  Received voicemail message from patient she has received resource letter with pantry list.   Follow Up Plan:  No further follow up planned at this time. The patient has been provided with needed resources.  Jozelynn Danielson, AAS Paralegal, [facility]  [address], [place] [address] ??[email or web address]  ?? [phone]   [email or web address]

## 2021-06-01 ENCOUNTER — Telehealth: Payer: Self-pay | Admitting: Family Medicine

## 2021-06-02 NOTE — Telephone Encounter (Signed)
Please schedule CPE with fasting labs prior with Dr. Diona Browner.  Already had Medicare Wellness with nurse.

## 2021-06-04 NOTE — Telephone Encounter (Signed)
No mychart and pt vm stated she is not accepting calls

## 2021-06-05 ENCOUNTER — Encounter: Payer: Self-pay | Admitting: *Deleted

## 2021-06-05 NOTE — Telephone Encounter (Signed)
Letter mailed to patient asking her to call the office and schedule a CPE with fasting labs prior with Dr. Diona Browner.  Okay to schedule for when we are back at [facility].

## 2021-06-05 NOTE — Telephone Encounter (Signed)
2nd attempt  Unable to LM to schedule

## 2021-08-20 ENCOUNTER — Other Ambulatory Visit: Payer: Self-pay

## 2021-08-20 ENCOUNTER — Telehealth: Payer: Self-pay | Admitting: Family Medicine

## 2021-08-20 ENCOUNTER — Other Ambulatory Visit (INDEPENDENT_AMBULATORY_CARE_PROVIDER_SITE_OTHER): Payer: Medicare Other

## 2021-08-20 DIAGNOSIS — E7800 Pure hypercholesterolemia, unspecified: Secondary | ICD-10-CM

## 2021-08-20 DIAGNOSIS — E038 Other specified hypothyroidism: Secondary | ICD-10-CM

## 2021-08-20 LAB — COMPREHENSIVE METABOLIC PANEL
ALT: 16 U/L (ref 0–35)
AST: 16 U/L (ref 0–37)
Albumin: 4.5 g/dL (ref 3.5–5.2)
Alkaline Phosphatase: 89 U/L (ref 39–117)
BUN: 20 mg/dL (ref 6–23)
CO2: 24 mEq/L (ref 19–32)
Calcium: 9.1 mg/dL (ref 8.4–10.5)
Chloride: 102 mEq/L (ref 96–112)
Creatinine, Ser: 0.65 mg/dL (ref 0.40–1.20)
GFR: 87.69 mL/min (ref >60.00)
Glucose, Bld: 100 mg/dL — ABNORMAL HIGH (ref 70–99)
Potassium: 4.5 mEq/L (ref 3.5–5.1)
Sodium: 136 mEq/L (ref 135–145)
Total Bilirubin: 0.5 mg/dL (ref 0.2–1.2)
Total Protein: 7 g/dL (ref 6.0–8.3)

## 2021-08-20 LAB — LIPID PANEL
Cholesterol: 189 mg/dL (ref 0–200)
HDL: 49.3 mg/dL (ref >39.00)
LDL Cholesterol: 122 mg/dL — ABNORMAL HIGH (ref 0–99)
NonHDL: 139.49
Total CHOL/HDL Ratio: 4
Triglycerides: 88 mg/dL (ref 0.0–149.0)
VLDL: 17.6 mg/dL (ref 0.0–40.0)

## 2021-08-20 NOTE — Telephone Encounter (Signed)
-----   Message from Ellamae Sia sent at 08/17/2021  3:22 PM EDT ----- ?Regarding: Lab orders for Tuesday, 3.23.23 ?Patient is scheduled for CPX labs, please order future labs, Thanks , Terri  ? ?

## 2021-08-21 NOTE — Progress Notes (Signed)
No critical labs need to be addressed urgently. We will discuss labs in detail at upcoming office visit.   

## 2021-08-27 ENCOUNTER — Ambulatory Visit (INDEPENDENT_AMBULATORY_CARE_PROVIDER_SITE_OTHER): Payer: Medicare Other | Admitting: Family Medicine

## 2021-08-27 ENCOUNTER — Encounter: Payer: Self-pay | Admitting: Family Medicine

## 2021-08-27 VITALS — BP 150/94 | HR 80 | Temp 98.0°F | Ht 59.0 in | Wt 160.5 lb

## 2021-08-27 DIAGNOSIS — I10 Essential (primary) hypertension: Secondary | ICD-10-CM

## 2021-08-27 DIAGNOSIS — Z8673 Personal history of transient ischemic attack (TIA), and cerebral infarction without residual deficits: Secondary | ICD-10-CM

## 2021-08-27 DIAGNOSIS — T466X5A Adverse effect of antihyperlipidemic and antiarteriosclerotic drugs, initial encounter: Secondary | ICD-10-CM

## 2021-08-27 DIAGNOSIS — I700 Atherosclerosis of aorta: Secondary | ICD-10-CM

## 2021-08-27 DIAGNOSIS — J439 Emphysema, unspecified: Secondary | ICD-10-CM | POA: Diagnosis not present

## 2021-08-27 DIAGNOSIS — E038 Other specified hypothyroidism: Secondary | ICD-10-CM | POA: Diagnosis not present

## 2021-08-27 DIAGNOSIS — M791 Myalgia, unspecified site: Secondary | ICD-10-CM | POA: Diagnosis not present

## 2021-08-27 DIAGNOSIS — F321 Major depressive disorder, single episode, moderate: Secondary | ICD-10-CM | POA: Diagnosis not present

## 2021-08-27 DIAGNOSIS — E7800 Pure hypercholesterolemia, unspecified: Secondary | ICD-10-CM

## 2021-08-27 MED ORDER — KETOCONAZOLE 2 % EX SHAM: 1.0000 "application " | MEDICATED_SHAMPOO | CUTANEOUS | 0 refills | Status: DC

## 2021-08-27 MED ORDER — EZETIMIBE 10 MG PO TABS: 10.0000 mg | ORAL_TABLET | Freq: Every day | ORAL | 3 refills | Status: DC

## 2021-08-27 NOTE — Assessment & Plan Note (Addendum)
Chronic, inadequate control.  LDL is not at goal less than 70.  We will have her restart Zetia as she is intolerant of statins.  ?

## 2021-08-27 NOTE — Progress Notes (Signed)
? ? Patient ID: Tiffany Orozco, female    DOB: May 14, 1951, 71 y.o.   MRN: [id number] ? ?This visit was conducted in person. ? ?BP (!) 150/94   Pulse 80   Temp 98 ?F (36.7 ?C) (Oral)   Ht '4\' 11"'$  (1.499 m)   Wt 160 lb 8 oz (72.8 kg)   SpO2 97%   BMI 32.42 kg/m?   ? ?CC:  ?Chief Complaint  ?Patient presents with  ? Annual Exam  ?  Part 2  ? ? ?Subjective:  ? ?HPI: ?Tiffany Orozco is a 71 y.o. female presenting on 08/27/2021 for Annual Exam (Part 2) ? ?The patient presents for   review of chronic health problems. He/She also has the following acute concerns today: ?  Itchy  dry skin on scalp ? ?The patient saw a LPN or RN for medicare wellness visit. ? Prevention and wellness was reviewed in detail. ?Note reviewed and important notes copied below. ? ? ?History of CVA on Aggrenox.. she feels that this may be contributing to her nausea. ? ?MDD, well controlled on  no medication. ? ?  08/27/2021  ?  3:25 PM 04/09/2021  ? 10:01 AM 02/29/2020  ? 11:41 AM  ?PHQ9 SCORE ONLY  ?PHQ-9 Total Score 14 0 4  ? ? ? ?Elevated Cholesterol:  inadequate control ... not on statin. LDL goal < 70 ?Lab Results  ?Component Value Date  ? CHOL 189 08/20/2021  ? HDL 49.30 08/20/2021  ? LDLCALC 122 (H) 08/20/2021  ? LDLDIRECT 103.0 09/02/2016  ? TRIG 88.0 08/20/2021  ? CHOLHDL 4 08/20/2021  ?Using medications without problems: ?Muscle aches:  ?Diet compliance: poor ?Exercise: minimal ?Other complaints: ?The 10-year ASCVD risk score (Arnett DK, et al., 2019) is: 23.5% ?  Values used to calculate the score: ?    Age: 89 years ?    Sex: Female ?    Is Non-Hispanic African American: No ?    Diabetic: No ?    Tobacco smoker: Yes ?    Systolic Blood Pressure: 329 mmHg ?    Is BP treated: No ?    HDL Cholesterol: 49.3 mg/dL ?    Total Cholesterol: 189 mg/dL ? ? ?Hypertension:    Poor control in office today .. on no medication.. she states welll controlled at home ?BP Readings from Last 3 Encounters:  ?08/27/21 (!) 150/94  ?01/20/21 120/60   ?01/12/21 (!) 101/56  ? ?Using medication without problems or lightheadedness:  ?Chest pain with exertion: none ?Edema:none ?Short of breath: none ?Average home BPs: 120/80 ?Other issues: ? ?Hypothyroid  at goal at last check on armour thyroid 90. ?Lab Results  ?Component Value Date  ? TSH 1.13 01/20/2021  ? ? ?Relevant past medical, surgical, family and social history reviewed and updated as indicated. Interim medical history since our last visit reviewed. ?Allergies and medications reviewed and updated. ?Outpatient Medications Prior to Visit  ?Medication Sig Dispense Refill  ? ARMOUR THYROID 90 MG tablet TAKE 1 TABLET BY MOUTH EVERY DAY 90 tablet 3  ? Cholecalciferol (VITAMIN D3) 125 MCG (5000 UT) TABS Take 5,000 Units by mouth daily.    ? dipyridamole-aspirin (AGGRENOX) 200-25 MG 12hr capsule Take 1 capsule by mouth 2 (two) times daily. 180 capsule 0  ? fluticasone (FLONASE) 50 MCG/ACT nasal spray SPRAY 2 SPRAYS INTO EACH NOSTRIL EVERY DAY 48 mL 1  ? dexlansoprazole (DEXILANT) 60 MG capsule Take 1 capsule (60 mg total) by mouth daily. 90 capsule 3  ? ondansetron (  ZOFRAN ODT) 4 MG disintegrating tablet Take 1 tablet (4 mg total) by mouth every 8 (eight) hours as needed for nausea or vomiting. (Patient not taking: Reported on 04/09/2021) 20 tablet 0  ? ?No facility-administered medications prior to visit.  ?  ? ?Per HPI unless specifically indicated in ROS section below ?Review of Systems  ?Constitutional:  Negative for fatigue and fever.  ?HENT:  Negative for congestion.   ?Eyes:  Negative for pain.  ?Respiratory:  Negative for cough and shortness of breath.   ?Cardiovascular:  Negative for chest pain, palpitations and leg swelling.  ?Gastrointestinal:  Positive for nausea. Negative for abdominal pain.  ?Genitourinary:  Negative for dysuria and vaginal bleeding.  ?Musculoskeletal:  Negative for back pain.  ?Neurological:  Negative for syncope, light-headedness and headaches.  ?Psychiatric/Behavioral:  Negative  for dysphoric mood.   ?Objective:  ?BP (!) 150/94   Pulse 80   Temp 98 ?F (36.7 ?C) (Oral)   Ht '4\' 11"'$  (1.499 m)   Wt 160 lb 8 oz (72.8 kg)   SpO2 97%   BMI 32.42 kg/m?   ?Wt Readings from Last 3 Encounters:  ?08/27/21 160 lb 8 oz (72.8 kg)  ?04/09/21 158 lb (71.7 kg)  ?02/05/21 156 lb (70.8 kg)  ?  ?  ?Physical Exam ?Vitals and nursing note reviewed.  ?Constitutional:   ?   General: She is not in acute distress. ?   Appearance: Normal appearance. She is well-developed. She is not ill-appearing or toxic-appearing.  ?HENT:  ?   Head: Normocephalic.  ?   Right Ear: Hearing, tympanic membrane, ear canal and external ear normal.  ?   Left Ear: Hearing, tympanic membrane, ear canal and external ear normal.  ?   Nose: Nose normal.  ?Eyes:  ?   General: Lids are normal. Lids are everted, no foreign bodies appreciated.  ?   Conjunctiva/sclera: Conjunctivae normal.  ?   Pupils: Pupils are equal, round, and reactive to light.  ?Neck:  ?   Thyroid: No thyroid mass or thyromegaly.  ?   Vascular: No carotid bruit.  ?   Trachea: Trachea normal.  ?Cardiovascular:  ?   Rate and Rhythm: Normal rate and regular rhythm.  ?   Heart sounds: Normal heart sounds, S1 normal and S2 normal. No murmur heard. ?  No gallop.  ?Pulmonary:  ?   Effort: Pulmonary effort is normal. No respiratory distress.  ?   Breath sounds: Normal breath sounds. No wheezing, rhonchi or rales.  ?Abdominal:  ?   General: Bowel sounds are normal. There is no distension or abdominal bruit.  ?   Palpations: Abdomen is soft. There is no fluid wave or mass.  ?   Tenderness: There is no abdominal tenderness. There is no guarding or rebound.  ?   Hernia: No hernia is present.  ?Musculoskeletal:  ?   Cervical back: Normal range of motion and neck supple.  ?Lymphadenopathy:  ?   Cervical: No cervical adenopathy.  ?Skin: ?   General: Skin is warm and dry.  ?   Findings: No rash.  ?Neurological:  ?   Mental Status: She is alert.  ?   Cranial Nerves: No cranial nerve  deficit.  ?   Sensory: No sensory deficit.  ?Psychiatric:     ?   Mood and Affect: Mood is not anxious or depressed.     ?   Speech: Speech normal.     ?   Behavior: Behavior normal. Behavior is cooperative.     ?  Judgment: Judgment normal.  ? ?   ?Results for orders placed or performed in visit on 08/20/21  ?Comprehensive metabolic panel  ?Result Value Ref Range  ? Sodium 136 135 - 145 mEq/L  ? Potassium 4.5 3.5 - 5.1 mEq/L  ? Chloride 102 96 - 112 mEq/L  ? CO2 24 19 - 32 mEq/L  ? Glucose, Bld 100 (H) 70 - 99 mg/dL  ? BUN 20 6 - 23 mg/dL  ? Creatinine, Ser 0.65 0.40 - 1.20 mg/dL  ? Total Bilirubin 0.5 0.2 - 1.2 mg/dL  ? Alkaline Phosphatase 89 39 - 117 U/L  ? AST 16 0 - 37 U/L  ? ALT 16 0 - 35 U/L  ? Total Protein 7.0 6.0 - 8.3 g/dL  ? Albumin 4.5 3.5 - 5.2 g/dL  ? GFR 87.69 >60.00 mL/min  ? Calcium 9.1 8.4 - 10.5 mg/dL  ?Lipid panel  ?Result Value Ref Range  ? Cholesterol 189 0 - 200 mg/dL  ? Triglycerides 88.0 0.0 - 149.0 mg/dL  ? HDL 49.30 >39.00 mg/dL  ? VLDL 17.6 0.0 - 40.0 mg/dL  ? LDL Cholesterol 122 (H) 0 - 99 mg/dL  ? Total CHOL/HDL Ratio 4   ? NonHDL 139.49   ? ? ?This visit occurred during the SARS-CoV-2 public health emergency.  Safety protocols were in place, including screening questions prior to the visit, additional usage of staff PPE, and extensive cleaning of exam room while observing appropriate contact time as indicated for disinfecting solutions.  ? ?COVID 19 screen:  No recent travel or known exposure to [place] ?The patient denies respiratory symptoms of COVID 19 at this time. ?The importance of social distancing was discussed today.  ? ?Assessment and Plan ? ? The patient's preventative maintenance and recommended screening tests for an annual wellness exam were reviewed in full today. ?Brought up to date unless services declined. ? ?Counselled on the importance of diet, exercise, and its role in overall health and mortality. ?The patient's FH and SH was reviewed, including their home  life, tobacco status, and drug and alcohol status.  ? ?Vaccines: Uptodate  Including COVID series, refused flu ?PAP/DVE:not indicated ? No current symptoms of prolapsed uterus. ?Mammo: 12/2019 ?DXA: osteopenia 11/2018 ?Colon: 12/201

## 2021-08-27 NOTE — Assessment & Plan Note (Signed)
Chronic, unclear control ? ?Poor control in the office today but she states it is well controlled at home despite not being on any medication.  She will continue to follow it at this time ?

## 2021-08-27 NOTE — Assessment & Plan Note (Signed)
Chronic ? ?Needs better cholesterol control.  She is intolerant of statins.  We will restart Zetia.  Goal LDL less than 70 ?

## 2021-08-27 NOTE — Assessment & Plan Note (Signed)
Noted incidentally on CT lung cancer screening.  She is a current smoker and has been encouraged to quit but is precontemplative.  She will return to discuss treatment of COPD, emphysema. ?

## 2021-08-27 NOTE — Assessment & Plan Note (Signed)
Chronic, worsening control next ?Patient is not interested in referral for counseling or restarting medication.  She will consider this and make an appointment if she changes her mind. ?

## 2021-08-27 NOTE — Assessment & Plan Note (Signed)
She is maintained on Aggrenox.  This could potentially be exacerbating her nausea vomiting and diarrhea symptoms but I do not believe it is the cause.  Unfortunately there is not an easy equivalent to change this medication to.  She will try taking it with food. ?

## 2021-08-27 NOTE — Patient Instructions (Addendum)
Follow BP at home.Marland Kitchen goal < 140/90. ? Restart zetia 10 mg daily. ? Work on low cholesterol diet. ? Can try ketoconazole shampoo from seborrheic dermatitis on scalp. ?Let me know what you decide about colon cancer screening. ? Quit smoking. ? Make an appt to discuss COPD/emphysema controller inhaler treatment. ? Take aggrenox with food. ?

## 2021-08-27 NOTE — Assessment & Plan Note (Signed)
Chronic, well controlled ? ?Armour Thyroid 90 mg daily ?

## 2021-08-31 ENCOUNTER — Other Ambulatory Visit: Payer: Self-pay | Admitting: Family Medicine

## 2021-11-13 ENCOUNTER — Telehealth: Payer: Self-pay | Admitting: Family Medicine

## 2021-11-13 DIAGNOSIS — E039 Hypothyroidism, unspecified: Secondary | ICD-10-CM

## 2021-11-13 DIAGNOSIS — E538 Deficiency of other specified B group vitamins: Secondary | ICD-10-CM

## 2021-11-13 DIAGNOSIS — E7800 Pure hypercholesterolemia, unspecified: Secondary | ICD-10-CM

## 2021-11-13 DIAGNOSIS — E559 Vitamin D deficiency, unspecified: Secondary | ICD-10-CM

## 2021-11-13 DIAGNOSIS — R7303 Prediabetes: Secondary | ICD-10-CM

## 2021-11-13 NOTE — Telephone Encounter (Signed)
-----   Message from Ellamae Sia sent at 11/11/2021  2:25 PM EDT ----- Regarding: Lab orders for Friday, 6.30.23 Lab orders, thanks

## 2021-11-27 ENCOUNTER — Other Ambulatory Visit (INDEPENDENT_AMBULATORY_CARE_PROVIDER_SITE_OTHER): Payer: Medicare Other

## 2021-11-27 DIAGNOSIS — E7800 Pure hypercholesterolemia, unspecified: Secondary | ICD-10-CM

## 2021-11-27 DIAGNOSIS — E538 Deficiency of other specified B group vitamins: Secondary | ICD-10-CM | POA: Diagnosis not present

## 2021-11-27 DIAGNOSIS — E039 Hypothyroidism, unspecified: Secondary | ICD-10-CM | POA: Diagnosis not present

## 2021-11-27 DIAGNOSIS — R7303 Prediabetes: Secondary | ICD-10-CM

## 2021-11-27 DIAGNOSIS — E559 Vitamin D deficiency, unspecified: Secondary | ICD-10-CM | POA: Diagnosis not present

## 2021-11-27 LAB — COMPREHENSIVE METABOLIC PANEL
ALT: 17 U/L (ref 0–35)
AST: 15 U/L (ref 0–37)
Albumin: 4.3 g/dL (ref 3.5–5.2)
Alkaline Phosphatase: 92 U/L (ref 39–117)
BUN: 12 mg/dL (ref 6–23)
CO2: 25 mEq/L (ref 19–32)
Calcium: 9.1 mg/dL (ref 8.4–10.5)
Chloride: 101 mEq/L (ref 96–112)
Creatinine, Ser: 0.58 mg/dL (ref 0.40–1.20)
GFR: 89.96 mL/min (ref >60.00)
Glucose, Bld: 87 mg/dL (ref 70–99)
Potassium: 4.4 mEq/L (ref 3.5–5.1)
Sodium: 133 mEq/L — ABNORMAL LOW (ref 135–145)
Total Bilirubin: 0.6 mg/dL (ref 0.2–1.2)
Total Protein: 6.8 g/dL (ref 6.0–8.3)

## 2021-11-27 LAB — HEMOGLOBIN A1C: Hgb A1c MFr Bld: 6 % (ref 4.6–6.5)

## 2021-11-27 LAB — LIPID PANEL
Cholesterol: 144 mg/dL (ref 0–200)
HDL: 44.1 mg/dL (ref >39.00)
LDL Cholesterol: 78 mg/dL (ref 0–99)
NonHDL: 100.28
Total CHOL/HDL Ratio: 3
Triglycerides: 109 mg/dL (ref 0.0–149.0)
VLDL: 21.8 mg/dL (ref 0.0–40.0)

## 2021-11-27 LAB — TSH: TSH: 1.27 u[IU]/mL (ref 0.35–5.50)

## 2021-11-27 LAB — T3, FREE: T3, Free: 4.9 pg/mL — ABNORMAL HIGH (ref 2.3–4.2)

## 2021-11-27 LAB — VITAMIN B12: Vitamin B-12: 510 pg/mL (ref 211–911)

## 2021-11-27 LAB — VITAMIN D 25 HYDROXY (VIT D DEFICIENCY, FRACTURES): VITD: 60.63 ng/mL (ref 30.00–100.00)

## 2021-11-27 LAB — T4, FREE: Free T4: 0.9 ng/dL (ref 0.60–1.60)

## 2021-11-30 ENCOUNTER — Encounter: Payer: Self-pay | Admitting: *Deleted

## 2021-11-30 ENCOUNTER — Other Ambulatory Visit: Payer: Self-pay | Admitting: Family Medicine

## 2021-11-30 DIAGNOSIS — E039 Hypothyroidism, unspecified: Secondary | ICD-10-CM

## 2021-11-30 MED ORDER — ARMOUR THYROID 90 MG PO TABS: ORAL_TABLET | ORAL | 3 refills | Status: DC

## 2021-11-30 MED ORDER — THYROID 60 MG PO TABS: ORAL_TABLET | ORAL | 3 refills | Status: DC

## 2021-11-30 NOTE — Progress Notes (Signed)
Okay, it sounds as though she is already  restricting her water intake, does not have to add sodium just do not restrict it.   Since she has stopped the Dexilant and is now absorbing the Armour Thyroid better, we need to reduce the dose. We will send in a slightly lower dose tablet which she should alternate with her 90 mg tablet.  60/90/60/90 etc. and she can return for recheck on thyroid lab tests in 4 to 6 weeks.

## 2021-12-22 ENCOUNTER — Telehealth: Payer: Self-pay | Admitting: Family Medicine

## 2021-12-22 ENCOUNTER — Emergency Department: Payer: Medicare Other

## 2021-12-22 ENCOUNTER — Emergency Department
Admission: EM | Admit: 2021-12-22 | Discharge: 2021-12-22 | Disposition: A | Discharge status: Home / Self Care | Payer: Medicare Other | Attending: Emergency Medicine | Admitting: Emergency Medicine

## 2021-12-22 ENCOUNTER — Other Ambulatory Visit: Payer: Self-pay

## 2021-12-22 DIAGNOSIS — E871 Hypo-osmolality and hyponatremia: Secondary | ICD-10-CM | POA: Diagnosis not present

## 2021-12-22 DIAGNOSIS — I959 Hypotension, unspecified: Secondary | ICD-10-CM | POA: Diagnosis not present

## 2021-12-22 DIAGNOSIS — Z20822 Contact with and (suspected) exposure to covid-19: Secondary | ICD-10-CM | POA: Diagnosis not present

## 2021-12-22 DIAGNOSIS — I700 Atherosclerosis of aorta: Secondary | ICD-10-CM | POA: Diagnosis not present

## 2021-12-22 DIAGNOSIS — R109 Unspecified abdominal pain: Secondary | ICD-10-CM | POA: Diagnosis not present

## 2021-12-22 DIAGNOSIS — R197 Diarrhea, unspecified: Secondary | ICD-10-CM | POA: Insufficient documentation

## 2021-12-22 DIAGNOSIS — Z9049 Acquired absence of other specified parts of digestive tract: Secondary | ICD-10-CM | POA: Diagnosis not present

## 2021-12-22 DIAGNOSIS — K2980 Duodenitis without bleeding: Secondary | ICD-10-CM | POA: Insufficient documentation

## 2021-12-22 DIAGNOSIS — I10 Essential (primary) hypertension: Secondary | ICD-10-CM | POA: Diagnosis not present

## 2021-12-22 DIAGNOSIS — R112 Nausea with vomiting, unspecified: Secondary | ICD-10-CM

## 2021-12-22 DIAGNOSIS — R0789 Other chest pain: Secondary | ICD-10-CM | POA: Diagnosis not present

## 2021-12-22 DIAGNOSIS — R079 Chest pain, unspecified: Secondary | ICD-10-CM | POA: Diagnosis not present

## 2021-12-22 LAB — CBC WITH DIFFERENTIAL/PLATELET
Abs Immature Granulocytes: 0.05 10*3/uL (ref 0.00–0.07)
Basophils Absolute: 0 10*3/uL (ref 0.0–0.1)
Basophils Relative: 0 %
Eosinophils Absolute: 0 10*3/uL (ref 0.0–0.5)
Eosinophils Relative: 0 %
HCT: 47.8 % — ABNORMAL HIGH (ref 36.0–46.0)
Hemoglobin: 16.3 g/dL — ABNORMAL HIGH (ref 12.0–15.0)
Immature Granulocytes: 1 %
Lymphocytes Relative: 9 %
Lymphs Abs: 0.9 10*3/uL (ref 0.7–4.0)
MCH: 30.5 pg (ref 26.0–34.0)
MCHC: 34.1 g/dL (ref 30.0–36.0)
MCV: 89.3 fL (ref 80.0–100.0)
Monocytes Absolute: 0.4 10*3/uL (ref 0.1–1.0)
Monocytes Relative: 4 %
Neutro Abs: 8.4 10*3/uL — ABNORMAL HIGH (ref 1.7–7.7)
Neutrophils Relative %: 86 %
Platelets: 340 10*3/uL (ref 150–400)
RBC: 5.35 MIL/uL — ABNORMAL HIGH (ref 3.87–5.11)
RDW: 13.2 % (ref 11.5–15.5)
WBC: 9.7 10*3/uL (ref 4.0–10.5)
nRBC: 0 % (ref 0.0–0.2)

## 2021-12-22 LAB — COMPREHENSIVE METABOLIC PANEL
ALT: 19 U/L (ref 0–44)
AST: 18 U/L (ref 15–41)
Albumin: 3.9 g/dL (ref 3.5–5.0)
Alkaline Phosphatase: 87 U/L (ref 38–126)
Anion gap: 11 (ref 5–15)
BUN: 10 mg/dL (ref 8–23)
CO2: 20 mmol/L — ABNORMAL LOW (ref 22–32)
Calcium: 8.3 mg/dL — ABNORMAL LOW (ref 8.9–10.3)
Chloride: 102 mmol/L (ref 98–111)
Creatinine, Ser: 0.54 mg/dL (ref 0.44–1.00)
GFR, Estimated: >60 mL/min (ref >60)
Glucose, Bld: 124 mg/dL — ABNORMAL HIGH (ref 70–99)
Potassium: 3.4 mmol/L — ABNORMAL LOW (ref 3.5–5.1)
Sodium: 133 mmol/L — ABNORMAL LOW (ref 135–145)
Total Bilirubin: 0.9 mg/dL (ref 0.3–1.2)
Total Protein: 7.2 g/dL (ref 6.5–8.1)

## 2021-12-22 LAB — URINALYSIS, ROUTINE W REFLEX MICROSCOPIC
Bilirubin Urine: NEGATIVE
Glucose, UA: NEGATIVE mg/dL
Ketones, ur: 20 mg/dL — AB
Leukocytes,Ua: NEGATIVE
Nitrite: NEGATIVE
Protein, ur: 100 mg/dL — AB
Specific Gravity, Urine: 1.012 (ref 1.005–1.030)
pH: 8 (ref 5.0–8.0)

## 2021-12-22 LAB — TROPONIN I (HIGH SENSITIVITY): Troponin I (High Sensitivity): 5 ng/L (ref <18)

## 2021-12-22 LAB — SARS CORONAVIRUS 2 BY RT PCR: SARS Coronavirus 2 by RT PCR: NEGATIVE

## 2021-12-22 LAB — MAGNESIUM: Magnesium: 2 mg/dL (ref 1.7–2.4)

## 2021-12-22 LAB — LIPASE, BLOOD: Lipase: 25 U/L (ref 11–51)

## 2021-12-22 MED ORDER — ONDANSETRON 4 MG PO TBDP: 4.0000 mg | ORAL_TABLET | Freq: Three times a day (TID) | ORAL | 0 refills | Status: AC | PRN

## 2021-12-22 MED ORDER — ONDANSETRON HCL 4 MG/2ML IJ SOLN
4.0000 mg | Freq: Once | INTRAMUSCULAR | Status: AC
  Administered 2021-12-22: 4 mg via INTRAVENOUS
  Filled 2021-12-22: qty 2

## 2021-12-22 MED ORDER — PANTOPRAZOLE SODIUM 40 MG IV SOLR
40.0000 mg | Freq: Once | INTRAVENOUS | Status: AC
  Administered 2021-12-22: 40 mg via INTRAVENOUS
  Filled 2021-12-22: qty 10

## 2021-12-22 MED ORDER — SODIUM CHLORIDE 0.9 % IV BOLUS
500.0000 mL | Freq: Once | INTRAVENOUS | Status: AC
  Administered 2021-12-22: 500 mL via INTRAVENOUS

## 2021-12-22 MED ORDER — IOHEXOL 300 MG/ML  SOLN
100.0000 mL | Freq: Once | INTRAMUSCULAR | Status: AC | PRN
  Administered 2021-12-22: 100 mL via INTRAVENOUS

## 2021-12-22 MED ORDER — PANTOPRAZOLE SODIUM 40 MG PO TBEC: 40.0000 mg | DELAYED_RELEASE_TABLET | Freq: Every day | ORAL | 0 refills | Status: DC

## 2021-12-22 MED ORDER — SUCRALFATE 1 G PO TABS: 1.0000 g | ORAL_TABLET | Freq: Three times a day (TID) | ORAL | 0 refills | Status: DC

## 2021-12-22 MED ORDER — METOCLOPRAMIDE HCL 5 MG/ML IJ SOLN
10.0000 mg | Freq: Once | INTRAMUSCULAR | Status: AC
  Administered 2021-12-22: 10 mg via INTRAVENOUS
  Filled 2021-12-22: qty 2

## 2021-12-22 MED ORDER — FENTANYL CITRATE PF 50 MCG/ML IJ SOSY
50.0000 ug | PREFILLED_SYRINGE | Freq: Once | INTRAMUSCULAR | Status: AC
  Administered 2021-12-22: 50 ug via INTRAVENOUS
  Filled 2021-12-22: qty 1

## 2021-12-22 NOTE — Telephone Encounter (Signed)
[facility] - Client TELEPHONE ADVICE RECORD [facility] Patient Name: Tiffany Orozco Gender: Female DOB: 06-Dec-1950 Age: 71 Y 2 M 12 D Return Phone Number: [phone] (Primary) Address[place]  [address] Client [facility] - Client Client Site [facility] - Day Provider [name] - MD Contact Type Call Who Is Calling Patient / Member / Family / Caregiver Call Type Triage / Clinical Caller Name Altha Harm Relationship To Patient Daughter Return Phone Number [phone] (Primary) Chief Complaint CHEST PAIN - pain, pressure, heaviness or tightness Reason for Call Symptomatic / Request for Health Information Initial Comment Call sent from office. Caller c/o vomiting,diarrhea, chest pain, SOB. Unable to keep anything down. States she gets sick like this off and on and gets dehydrated. Requesting an IV. Translation No Nurse Assessment Nurse: Micki Riley, RN, Domenick Gong Date/Time (Eastern Time): 12/22/2021 8:24:41 AM Confirm and document reason for call. If symptomatic, describe symptoms. ---Caller states mother has been having chest pain, shortness of breath, vomiting, diarrhea, raw throat, & fever; first noted yesterday. Denies new meds/abx or any other symptoms. Does the patient have any new or worsening symptoms? ---Yes Will a triage be completed? ---Yes Related visit to physician within the last 2 weeks? ---N/A Does the PT have any chronic conditions? (i.e. diabetes, asthma, this includes High risk factors for pregnancy, etc.) ---Yes List chronic conditions. ---previous stroke, thyroid issues Is this a behavioral health or substance abuse call? ---No Guidelines Guideline Title Affirmed Question Affirmed Notes Nurse Date/Time (Eastern Time) Chest Pain [1] Chest pain lasts > 5 minutes AND [2] age > 61 Cazares, RN, Domenick Gong 12/22/2021  8:22:20 AM PLEASE NOTE: All timestamps contained within this report are represented as [place] Standard Time. CONFIDENTIALTY NOTICE: This fax transmission is intended only for the addressee. It contains information that is legally privileged, confidential or otherwise protected from use or disclosure. If you are not the intended recipient, you are strictly prohibited from reviewing, disclosing, copying using or disseminating any of this information or taking any action in reliance on or regarding this information. If you have received this fax in error, please notify us immediately by telephone so that we can arrange for its return to [place]. Phone: [phone], Toll-Free: [phone], Fax: [phone] Page: 2 of 2 Call Id: [id number] [place]. Time Eilene Ghazi Time) Disposition Final User 12/22/2021 8:17:14 AM Send to Urgent Queue Kathlynn Grate 12/22/2021 8:24:20 AM Call EMS 911 Now Yes Cazares, RN, Domenick Gong 12/22/2021 8:31:53 AM 911 Outcome Documentation Cazares, RN, Domenick Gong Reason: Caller had agreed to call 911, nurse's 911 follow-up call answered: states EMS en route. Final Disposition 12/22/2021 8:24:20 AM Call EMS 911 Now Yes Cazares, RN, Domenick Gong Caller Disagree/Comply Comply Caller Understands Yes PreDisposition InappropriateToAsk Care Advice Given Per Guideline CALL EMS 911 NOW: * Immediate medical attention is needed. You need to hang up and call 911 (or an ambulance). * Triager Discretion: I'll call you back in a few minutes to be sure you were able to reach them. CARE ADVICE given per Chest Pain (Adult) guideline

## 2021-12-22 NOTE — ED Provider Notes (Addendum)
[facility] Provider Note    Event Date/Time   First MD Initiated Contact with Patient 12/22/21 [phone]     (approximate)   History   Nausea   HPI  Tiffany Orozco is a 71 y.o. female with history of hyponatremia who comes in with concern for nausea vomiting diarrhea.  Patient reports that her symptoms started 3 days ago.  She reports multiple episodes of nonbloody nonbilious vomiting as well as some abdominal cramping secondary to the vomiting.  She denies any significant tenderness.  She reports having low sodiums in the past and feeling like her sodium level was low.  She reports that since having the vomiting she feels a acid taste in her chest and having some lower abdominal cramping.  I reviewed patient's hospital admission from 8/11 to 8/15 where she had a CT scan showing enteritis, SIADH  Physical Exam   Triage Vital Signs: ED Triage Vitals  Enc Vitals Group     BP 12/22/21 0942 (!) 183/74     Pulse --      Resp 12/22/21 0942 16     Temp 12/22/21 0942 97.7 F (36.5 C)     Temp src --      SpO2 12/22/21 0942 97 %     Weight 12/22/21 0940 158 lb (71.7 kg)     Height 12/22/21 0940 5' (1.524 m)     Head Circumference --      Peak Flow --      Pain Score 12/22/21 0940 4     Pain Loc --      Pain Edu? --      Excl. in [place]? --     Most recent vital signs: Vitals:   12/22/21 0942  BP: (!) 183/74  Resp: 16  Temp: 97.7 F (36.5 C)  SpO2: 97%     General: Awake, no distress.  CV:  Good peripheral perfusion.  Resp:  Normal effort.  Abd:  No distention.  Abdomen is soft and nontender. Other:  No swelling in the legs   ED Results / Procedures / Treatments   Labs (all labs ordered are listed, but only abnormal results are displayed) Labs Reviewed  CBC WITH DIFFERENTIAL/PLATELET - Abnormal; Notable for the following components:      Result Value   RBC 5.35 (*)    Hemoglobin 16.3 (*)    HCT 47.8 (*)    Neutro Abs 8.4 (*)    All other  components within normal limits  SARS CORONAVIRUS 2 BY RT PCR  URINALYSIS, ROUTINE W REFLEX MICROSCOPIC  COMPREHENSIVE METABOLIC PANEL  LIPASE, BLOOD  MAGNESIUM  TROPONIN I (HIGH SENSITIVITY)     EKG  My interpretation of EKG:  Normal sinus rhythm 94 without any ST elevation or T wave inversions, normal intervals  RADIOLOGY I have reviewed the CT personally interpreted and no evidence of kidney stone.  1. Query mild thickening of the duodenum which is stable potentially related to mild duodenitis or ulcer disease but without surrounding stranding or substantial change from prior imaging. 2. Post cholecystectomy without biliary duct dilation. 3. Aortic atherosclerosis. 4. Suspect mild hepatic steatosis.   PROCEDURES:  Critical Care performed: No  .1-3 Lead EKG Interpretation  Performed by: Vanessa [place], MD Authorized by: Vanessa Murray, MD     Interpretation: normal     ECG rate:  90   ECG rate assessment: normal     Rhythm: sinus rhythm     Ectopy: none  Conduction: normal      MEDICATIONS ORDERED IN ED: Medications  pantoprazole (PROTONIX) injection 40 mg (40 mg Intravenous Given 12/22/21 1104)  ondansetron (ZOFRAN) injection 4 mg (4 mg Intravenous Given 12/22/21 1104)  sodium chloride 0.9 % bolus 500 mL (0 mLs Intravenous Stopped 12/22/21 1131)  metoCLOPramide (REGLAN) injection 10 mg (10 mg Intravenous Given 12/22/21 1307)  fentaNYL (SUBLIMAZE) injection 50 mcg (50 mcg Intravenous Given 12/22/21 1307)  iohexol (OMNIPAQUE) 300 MG/ML solution 100 mL (100 mLs Intravenous Contrast Given 12/22/21 1258)     IMPRESSION / MDM / ASSESSMENT AND PLAN / ED COURSE  I reviewed the triage vital signs and the nursing notes.   Patient's presentation is most consistent with acute presentation with potential threat to life or bodily function.   Differential includes Electra abnormalities, AKI, enteritis, will get blood work give some symptomatic treatment and reevaluate  patient to see if she needs CT imaging.  UA with slight ketones but patient to get some fluids with myself and EMS.  CMP shows sodium of 133.  Troponin is negative and symptoms of been going on for greater than 3 hours and she denies any chest pain it was more just pain after having all of the vomiting.  IMPRESSION: 1. Query mild thickening of the duodenum which is stable potentially related to mild duodenitis or ulcer disease but without surrounding stranding or substantial change from prior imaging. 2. Post cholecystectomy without biliary duct dilation. 3. Aortic atherosclerosis. 4. Suspect mild hepatic steatosis.  Discussed with patient the CT scan concerning for duodenitis versus ulcer.  Patient will be started on PPI, Carafate and given GI number for follow-up.  She denies any antibiotics recently and I have lower suspicion for C. difficile.  She has had no stooling here for [place] to send off for testing.  Patient reports resolution of symptoms is able to tolerate p.o. and abdomen is soft and nontender and feels comfortable with discharge home  Considered admission but due to tolerating p.o. and reassuring CT scan patient is comfortable going home  Discussed with patient avoiding alcohol, smoking, ibuprofen etc.  The patient is on the cardiac monitor to evaluate for evidence of arrhythmia and/or significant heart rate changes.      FINAL CLINICAL IMPRESSION(S) / ED DIAGNOSES   Final diagnoses:  Duodenitis  Nausea vomiting and diarrhea     Rx / DC Orders   ED Discharge Orders          Ordered    pantoprazole (PROTONIX) 40 MG tablet  Daily        12/22/21 1353    sucralfate (CARAFATE) 1 g tablet  3 times daily with meals & bedtime        12/22/21 1353    ondansetron (ZOFRAN-ODT) 4 MG disintegrating tablet  Every 8 hours PRN        12/22/21 1353             Note:  This document was prepared using Dragon voice recognition software and may include unintentional  dictation errors.   Vanessa [place], MD 12/22/21 1355    Vanessa [place], MD 12/22/21 1355

## 2021-12-22 NOTE — Telephone Encounter (Signed)
Patient called and stated she has been throwing up since Monday, also stated other symptoms that are diarrhea, cant drink or eat anything, not able to keep anything down, stomach pain down to her chest, shortness of breath, took nausea pill last night.

## 2021-12-22 NOTE — Discharge Instructions (Addendum)
Your work-up was reassuring but your CT scan did show concern for possible duodenitis or ulcer therefore I have given you GI's number you to call them to make a follow-up appointment.  Return to the ER if develop fevers, worsening pain or any other concerns   1. Query mild thickening of the duodenum which is stable potentially related to mild duodenitis or ulcer disease but without surrounding stranding or substantial change from prior imaging. 2. Post cholecystectomy without biliary duct dilation. 3. Aortic atherosclerosis. 4. Suspect mild hepatic steatosis.

## 2021-12-22 NOTE — ED Notes (Signed)
Patient tolerating PO cranberry juice and saltine crackers

## 2021-12-22 NOTE — Telephone Encounter (Signed)
Per chart review pt is at [facility] ED presently. Sending note to DR [facility].

## 2021-12-22 NOTE — Telephone Encounter (Signed)
I spoke with Tiffany Orozco pts daughter (DPR signed) and pt is waiting on EMS to take pt to [facility] ED now. Sending note to Dr Diona Browner and Butch Penny CMA.

## 2021-12-22 NOTE — ED Triage Notes (Signed)
Patient brought in via ems from home. Patient states she has been N/V/D since Saturday. C/o midsternal chest pain when vomiting.

## 2022-01-06 DIAGNOSIS — H16223 Keratoconjunctivitis sicca, not specified as Sjogren's, bilateral: Secondary | ICD-10-CM | POA: Diagnosis not present

## 2022-01-06 DIAGNOSIS — H40033 Anatomical narrow angle, bilateral: Secondary | ICD-10-CM | POA: Diagnosis not present

## 2022-01-06 DIAGNOSIS — H25813 Combined forms of age-related cataract, bilateral: Secondary | ICD-10-CM | POA: Diagnosis not present

## 2022-01-06 DIAGNOSIS — H353132 Nonexudative age-related macular degeneration, bilateral, intermediate dry stage: Secondary | ICD-10-CM | POA: Diagnosis not present

## 2022-01-11 ENCOUNTER — Other Ambulatory Visit (INDEPENDENT_AMBULATORY_CARE_PROVIDER_SITE_OTHER): Payer: Medicare Other

## 2022-01-11 DIAGNOSIS — E039 Hypothyroidism, unspecified: Secondary | ICD-10-CM

## 2022-01-11 LAB — T4, FREE: Free T4: 0.91 ng/dL (ref 0.60–1.60)

## 2022-01-11 LAB — T3, FREE: T3, Free: 4.6 pg/mL — ABNORMAL HIGH (ref 2.3–4.2)

## 2022-01-11 LAB — TSH: TSH: 1.94 u[IU]/mL (ref 0.35–5.50)

## 2022-01-12 ENCOUNTER — Other Ambulatory Visit: Payer: Self-pay | Admitting: Family Medicine

## 2022-01-22 ENCOUNTER — Ambulatory Visit (INDEPENDENT_AMBULATORY_CARE_PROVIDER_SITE_OTHER): Payer: Medicare Other | Admitting: Family Medicine

## 2022-01-22 ENCOUNTER — Encounter: Payer: Self-pay | Admitting: Family Medicine

## 2022-01-22 VITALS — BP 150/76 | HR 72 | Temp 98.2°F | Ht 59.0 in | Wt 163.4 lb

## 2022-01-22 DIAGNOSIS — R42 Dizziness and giddiness: Secondary | ICD-10-CM | POA: Insufficient documentation

## 2022-01-22 DIAGNOSIS — R1013 Epigastric pain: Secondary | ICD-10-CM | POA: Diagnosis not present

## 2022-01-22 MED ORDER — PANTOPRAZOLE SODIUM 40 MG PO TBEC
40.0000 mg | DELAYED_RELEASE_TABLET | Freq: Every day | ORAL | 1 refills | Status: DC
Start: 2022-01-22 — End: 2022-03-02

## 2022-01-22 MED ORDER — SUCRALFATE 1 G PO TABS: 1.0000 g | ORAL_TABLET | Freq: Three times a day (TID) | ORAL | 0 refills | Status: DC | PRN

## 2022-01-22 NOTE — Assessment & Plan Note (Signed)
Acute, significant improvement with PPI and Carafate.  Most likely due to ulcer.  She will continue Protonix 40 mg p.o. daily.  She can use the Carafate as needed given it has been so helpful to all her chronic symptoms. She will keep follow-up appointment with GI to discuss possible need for endoscopy or further testing.  She is also overdue for colon cancer screening.

## 2022-01-22 NOTE — Patient Instructions (Addendum)
Continue Protonix daoly.  Can use Carafate as needed for stomach upset, acid flare, belching.  Can try Flonase 2 sprays per nostril daily x 2-4 week. Keep appt with GI as planned in !07/2021.

## 2022-01-22 NOTE — Assessment & Plan Note (Signed)
Acute worsening of chronic issue  Most likely secondary to inner ear issues,possible BPPV. Given worsening with pressure change, possible fluid behind TMs.  Will treat with Flonase 2 sprays per nostril daily.
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
## 2022-02-05 ENCOUNTER — Ambulatory Visit
Admission: RE | Admit: 2022-02-05 | Discharge: 2022-02-05 | Disposition: A | Discharge status: Home / Self Care | Payer: Medicare Other | Source: Ambulatory Visit | Attending: Family Medicine | Admitting: Family Medicine

## 2022-02-05 DIAGNOSIS — Z122 Encounter for screening for malignant neoplasm of respiratory organs: Secondary | ICD-10-CM | POA: Diagnosis not present

## 2022-02-05 DIAGNOSIS — Z87891 Personal history of nicotine dependence: Secondary | ICD-10-CM | POA: Diagnosis not present

## 2022-02-05 DIAGNOSIS — F17210 Nicotine dependence, cigarettes, uncomplicated: Secondary | ICD-10-CM | POA: Diagnosis not present

## 2022-02-08 ENCOUNTER — Other Ambulatory Visit: Payer: Self-pay | Admitting: Acute Care

## 2022-02-08 DIAGNOSIS — Z122 Encounter for screening for malignant neoplasm of respiratory organs: Secondary | ICD-10-CM

## 2022-02-08 DIAGNOSIS — Z87891 Personal history of nicotine dependence: Secondary | ICD-10-CM

## 2022-02-08 DIAGNOSIS — F17210 Nicotine dependence, cigarettes, uncomplicated: Secondary | ICD-10-CM

## 2022-02-11 DIAGNOSIS — H16223 Keratoconjunctivitis sicca, not specified as Sjogren's, bilateral: Secondary | ICD-10-CM | POA: Diagnosis not present

## 2022-02-11 DIAGNOSIS — H524 Presbyopia: Secondary | ICD-10-CM | POA: Diagnosis not present

## 2022-02-11 DIAGNOSIS — H01021 Squamous blepharitis right upper eyelid: Secondary | ICD-10-CM | POA: Diagnosis not present

## 2022-02-11 DIAGNOSIS — H01024 Squamous blepharitis left upper eyelid: Secondary | ICD-10-CM | POA: Diagnosis not present

## 2022-02-15 DIAGNOSIS — Z1231 Encounter for screening mammogram for malignant neoplasm of breast: Secondary | ICD-10-CM | POA: Diagnosis not present

## 2022-02-15 LAB — HM MAMMOGRAPHY

## 2022-02-16 ENCOUNTER — Encounter: Payer: Self-pay | Admitting: Family Medicine

## 2022-02-25 ENCOUNTER — Other Ambulatory Visit: Payer: Self-pay

## 2022-02-25 ENCOUNTER — Emergency Department
Admission: EM | Admit: 2022-02-25 | Discharge: 2022-02-26 | Disposition: A | Discharge status: Home / Self Care | Payer: Medicare Other | Source: Home / Self Care | Attending: Emergency Medicine | Admitting: Emergency Medicine

## 2022-02-25 ENCOUNTER — Emergency Department: Payer: Medicare Other

## 2022-02-25 DIAGNOSIS — E039 Hypothyroidism, unspecified: Secondary | ICD-10-CM | POA: Insufficient documentation

## 2022-02-25 DIAGNOSIS — Z79899 Other long term (current) drug therapy: Secondary | ICD-10-CM | POA: Diagnosis not present

## 2022-02-25 DIAGNOSIS — R509 Fever, unspecified: Secondary | ICD-10-CM | POA: Insufficient documentation

## 2022-02-25 DIAGNOSIS — I700 Atherosclerosis of aorta: Secondary | ICD-10-CM | POA: Diagnosis not present

## 2022-02-25 DIAGNOSIS — Z1152 Encounter for screening for COVID-19: Secondary | ICD-10-CM | POA: Diagnosis not present

## 2022-02-25 DIAGNOSIS — R197 Diarrhea, unspecified: Secondary | ICD-10-CM | POA: Insufficient documentation

## 2022-02-25 DIAGNOSIS — R112 Nausea with vomiting, unspecified: Secondary | ICD-10-CM | POA: Diagnosis not present

## 2022-02-25 DIAGNOSIS — M791 Myalgia, unspecified site: Secondary | ICD-10-CM | POA: Diagnosis not present

## 2022-02-25 DIAGNOSIS — F17210 Nicotine dependence, cigarettes, uncomplicated: Secondary | ICD-10-CM | POA: Diagnosis present

## 2022-02-25 DIAGNOSIS — R1110 Vomiting, unspecified: Secondary | ICD-10-CM | POA: Diagnosis not present

## 2022-02-25 DIAGNOSIS — R5383 Other fatigue: Secondary | ICD-10-CM | POA: Insufficient documentation

## 2022-02-25 DIAGNOSIS — M7918 Myalgia, other site: Secondary | ICD-10-CM | POA: Insufficient documentation

## 2022-02-25 DIAGNOSIS — R109 Unspecified abdominal pain: Secondary | ICD-10-CM | POA: Diagnosis not present

## 2022-02-25 DIAGNOSIS — Z79890 Hormone replacement therapy (postmenopausal): Secondary | ICD-10-CM | POA: Diagnosis not present

## 2022-02-25 DIAGNOSIS — K2990 Gastroduodenitis, unspecified, without bleeding: Secondary | ICD-10-CM | POA: Diagnosis not present

## 2022-02-25 DIAGNOSIS — I2510 Atherosclerotic heart disease of native coronary artery without angina pectoris: Secondary | ICD-10-CM | POA: Diagnosis present

## 2022-02-25 DIAGNOSIS — Z8249 Family history of ischemic heart disease and other diseases of the circulatory system: Secondary | ICD-10-CM | POA: Diagnosis not present

## 2022-02-25 DIAGNOSIS — Z808 Family history of malignant neoplasm of other organs or systems: Secondary | ICD-10-CM | POA: Diagnosis not present

## 2022-02-25 DIAGNOSIS — Z20822 Contact with and (suspected) exposure to covid-19: Secondary | ICD-10-CM | POA: Insufficient documentation

## 2022-02-25 DIAGNOSIS — D72829 Elevated white blood cell count, unspecified: Secondary | ICD-10-CM | POA: Insufficient documentation

## 2022-02-25 DIAGNOSIS — K580 Irritable bowel syndrome with diarrhea: Secondary | ICD-10-CM | POA: Diagnosis present

## 2022-02-25 DIAGNOSIS — K209 Esophagitis, unspecified without bleeding: Secondary | ICD-10-CM | POA: Diagnosis not present

## 2022-02-25 DIAGNOSIS — Z8673 Personal history of transient ischemic attack (TIA), and cerebral infarction without residual deficits: Secondary | ICD-10-CM | POA: Diagnosis not present

## 2022-02-25 DIAGNOSIS — J439 Emphysema, unspecified: Secondary | ICD-10-CM | POA: Diagnosis present

## 2022-02-25 DIAGNOSIS — K2970 Gastritis, unspecified, without bleeding: Secondary | ICD-10-CM | POA: Diagnosis present

## 2022-02-25 DIAGNOSIS — K267 Chronic duodenal ulcer without hemorrhage or perforation: Secondary | ICD-10-CM | POA: Diagnosis present

## 2022-02-25 DIAGNOSIS — R059 Cough, unspecified: Secondary | ICD-10-CM | POA: Diagnosis not present

## 2022-02-25 DIAGNOSIS — Z800 Family history of malignant neoplasm of digestive organs: Secondary | ICD-10-CM | POA: Diagnosis not present

## 2022-02-25 DIAGNOSIS — R051 Acute cough: Secondary | ICD-10-CM | POA: Diagnosis not present

## 2022-02-25 DIAGNOSIS — R110 Nausea: Secondary | ICD-10-CM | POA: Diagnosis not present

## 2022-02-25 DIAGNOSIS — R1084 Generalized abdominal pain: Secondary | ICD-10-CM | POA: Insufficient documentation

## 2022-02-25 DIAGNOSIS — R1115 Cyclical vomiting syndrome unrelated to migraine: Secondary | ICD-10-CM | POA: Diagnosis present

## 2022-02-25 DIAGNOSIS — F419 Anxiety disorder, unspecified: Secondary | ICD-10-CM | POA: Diagnosis present

## 2022-02-25 DIAGNOSIS — K2980 Duodenitis without bleeding: Secondary | ICD-10-CM | POA: Diagnosis not present

## 2022-02-25 DIAGNOSIS — R1111 Vomiting without nausea: Secondary | ICD-10-CM | POA: Diagnosis not present

## 2022-02-25 DIAGNOSIS — K269 Duodenal ulcer, unspecified as acute or chronic, without hemorrhage or perforation: Secondary | ICD-10-CM | POA: Diagnosis not present

## 2022-02-25 DIAGNOSIS — I10 Essential (primary) hypertension: Secondary | ICD-10-CM | POA: Diagnosis not present

## 2022-02-25 DIAGNOSIS — J449 Chronic obstructive pulmonary disease, unspecified: Secondary | ICD-10-CM | POA: Insufficient documentation

## 2022-02-25 DIAGNOSIS — E871 Hypo-osmolality and hyponatremia: Secondary | ICD-10-CM | POA: Diagnosis present

## 2022-02-25 DIAGNOSIS — F32A Depression, unspecified: Secondary | ICD-10-CM | POA: Diagnosis present

## 2022-02-25 DIAGNOSIS — T7840XA Allergy, unspecified, initial encounter: Secondary | ICD-10-CM | POA: Diagnosis not present

## 2022-02-25 DIAGNOSIS — K222 Esophageal obstruction: Secondary | ICD-10-CM | POA: Diagnosis not present

## 2022-02-25 DIAGNOSIS — K2100 Gastro-esophageal reflux disease with esophagitis, without bleeding: Secondary | ICD-10-CM | POA: Diagnosis not present

## 2022-02-25 DIAGNOSIS — K2960 Other gastritis without bleeding: Secondary | ICD-10-CM | POA: Diagnosis not present

## 2022-02-25 DIAGNOSIS — E876 Hypokalemia: Secondary | ICD-10-CM | POA: Diagnosis not present

## 2022-02-25 DIAGNOSIS — Z801 Family history of malignant neoplasm of trachea, bronchus and lung: Secondary | ICD-10-CM | POA: Diagnosis not present

## 2022-02-25 DIAGNOSIS — E860 Dehydration: Secondary | ICD-10-CM | POA: Diagnosis present

## 2022-02-25 LAB — COMPREHENSIVE METABOLIC PANEL
ALT: 15 U/L (ref 0–44)
AST: 17 U/L (ref 15–41)
Albumin: 4.2 g/dL (ref 3.5–5.0)
Alkaline Phosphatase: 90 U/L (ref 38–126)
Anion gap: 12 (ref 5–15)
BUN: 16 mg/dL (ref 8–23)
CO2: 22 mmol/L (ref 22–32)
Calcium: 9.3 mg/dL (ref 8.9–10.3)
Chloride: 95 mmol/L — ABNORMAL LOW (ref 98–111)
Creatinine, Ser: 0.9 mg/dL (ref 0.44–1.00)
GFR, Estimated: >60 mL/min (ref >60)
Glucose, Bld: 132 mg/dL — ABNORMAL HIGH (ref 70–99)
Potassium: 3.9 mmol/L (ref 3.5–5.1)
Sodium: 129 mmol/L — ABNORMAL LOW (ref 135–145)
Total Bilirubin: 1.2 mg/dL (ref 0.3–1.2)
Total Protein: 8 g/dL (ref 6.5–8.1)

## 2022-02-25 LAB — CBC WITH DIFFERENTIAL/PLATELET
Abs Immature Granulocytes: 0.05 10*3/uL (ref 0.00–0.07)
Basophils Absolute: 0 10*3/uL (ref 0.0–0.1)
Basophils Relative: 0 %
Eosinophils Absolute: 0 10*3/uL (ref 0.0–0.5)
Eosinophils Relative: 0 %
HCT: 46.3 % — ABNORMAL HIGH (ref 36.0–46.0)
Hemoglobin: 15.6 g/dL — ABNORMAL HIGH (ref 12.0–15.0)
Immature Granulocytes: 0 %
Lymphocytes Relative: 17 %
Lymphs Abs: 2 10*3/uL (ref 0.7–4.0)
MCH: 30 pg (ref 26.0–34.0)
MCHC: 33.7 g/dL (ref 30.0–36.0)
MCV: 89 fL (ref 80.0–100.0)
Monocytes Absolute: 1.3 10*3/uL — ABNORMAL HIGH (ref 0.1–1.0)
Monocytes Relative: 11 %
Neutro Abs: 8.5 10*3/uL — ABNORMAL HIGH (ref 1.7–7.7)
Neutrophils Relative %: 72 %
Platelets: 366 10*3/uL (ref 150–400)
RBC: 5.2 MIL/uL — ABNORMAL HIGH (ref 3.87–5.11)
RDW: 13.2 % (ref 11.5–15.5)
WBC: 11.9 10*3/uL — ABNORMAL HIGH (ref 4.0–10.5)
nRBC: 0 % (ref 0.0–0.2)

## 2022-02-25 LAB — LIPASE, BLOOD: Lipase: 25 U/L (ref 11–51)

## 2022-02-25 MED ORDER — SODIUM CHLORIDE 0.9 % IV BOLUS
1000.0000 mL | Freq: Once | INTRAVENOUS | Status: AC
  Administered 2022-02-26: 1000 mL via INTRAVENOUS

## 2022-02-25 MED ORDER — ACETAMINOPHEN 500 MG PO TABS
1000.0000 mg | ORAL_TABLET | Freq: Once | ORAL | Status: AC
  Administered 2022-02-26: 1000 mg via ORAL
  Filled 2022-02-25: qty 2

## 2022-02-25 MED ORDER — SODIUM CHLORIDE 0.9 % IV BOLUS: 500.0000 mL | Freq: Once | INTRAVENOUS | Status: DC

## 2022-02-25 MED ORDER — IBUPROFEN 400 MG PO TABS
400.0000 mg | ORAL_TABLET | Freq: Once | ORAL | Status: AC
  Administered 2022-02-26: 400 mg via ORAL
  Filled 2022-02-25: qty 1

## 2022-02-25 MED ORDER — SODIUM CHLORIDE 0.9 % IV BOLUS
500.0000 mL | Freq: Once | INTRAVENOUS | Status: AC
  Administered 2022-02-25: 500 mL via INTRAVENOUS

## 2022-02-25 MED ORDER — ONDANSETRON HCL 4 MG/2ML IJ SOLN
4.0000 mg | Freq: Once | INTRAMUSCULAR | Status: AC
  Administered 2022-02-25: 4 mg via INTRAVENOUS
  Filled 2022-02-25: qty 2

## 2022-02-25 NOTE — ED Notes (Signed)
Patient transported to X-ray 

## 2022-02-25 NOTE — ED Provider Triage Note (Signed)
Emergency Medicine Provider Triage Evaluation Note  Tiffany Orozco , a 71 y.o. female  was evaluated in triage.  Pt complains of gastroenteritis, duodenitis and gastritis, presents to the emergency department with multiple episodes of vomiting.  Patient reports that she does have a history of hyponatremia and typically needs rehydration when she experiences similar symptoms.  She denies chest pain, chest tightness or shortness of breath.  No fever or chills.  Review of Systems  Positive: Patient has nausea and vomiting. Negative: No chest pain, chest tightness or shortness of breath.  Physical Exam  BP (!) 141/86   Pulse 94   Temp 98.3 F (36.8 C)   Resp 20   Wt 74.8 kg   SpO2 93%   BMI 33.33 kg/m  Gen:   Awake, no distress   Resp:  Normal effort  MSK:   Moves extremities without difficulty  Other:    Medical Decision Making  Medically screening exam initiated at 6:04 PM.  Appropriate orders placed.  Ruchama K Galeana was informed that the remainder of the evaluation will be completed by another provider, this initial triage assessment does not replace that evaluation, and the importance of remaining in the ED until their evaluation is complete.     Vallarie Mare [place], [place] 02/25/22 1805

## 2022-02-25 NOTE — ED Notes (Signed)
ED Provider at bedside. 

## 2022-02-25 NOTE — ED Notes (Signed)
Pt brought to ED rm 17 at this time, this RN now assuming care. 

## 2022-02-25 NOTE — ED Triage Notes (Signed)
Pt arrives with c/o n/v that started last night. Per pt, these symptoms are recurrent every few months. Pt unsure if she has had fevers.

## 2022-02-26 ENCOUNTER — Other Ambulatory Visit: Payer: Self-pay

## 2022-02-26 ENCOUNTER — Emergency Department (HOSPITAL_COMMUNITY)
Admission: EM | Admit: 2022-02-26 | Discharge: 2022-02-27 | Disposition: A | Discharge status: Home / Self Care | Payer: Medicare Other | Attending: Emergency Medicine | Admitting: Emergency Medicine

## 2022-02-26 ENCOUNTER — Emergency Department: Payer: Medicare Other

## 2022-02-26 ENCOUNTER — Encounter (HOSPITAL_COMMUNITY): Payer: Self-pay | Admitting: Emergency Medicine

## 2022-02-26 DIAGNOSIS — I10 Essential (primary) hypertension: Secondary | ICD-10-CM | POA: Insufficient documentation

## 2022-02-26 DIAGNOSIS — R109 Unspecified abdominal pain: Secondary | ICD-10-CM | POA: Diagnosis not present

## 2022-02-26 DIAGNOSIS — E039 Hypothyroidism, unspecified: Secondary | ICD-10-CM | POA: Insufficient documentation

## 2022-02-26 DIAGNOSIS — R112 Nausea with vomiting, unspecified: Secondary | ICD-10-CM | POA: Diagnosis not present

## 2022-02-26 DIAGNOSIS — R1111 Vomiting without nausea: Secondary | ICD-10-CM | POA: Diagnosis not present

## 2022-02-26 DIAGNOSIS — R110 Nausea: Secondary | ICD-10-CM | POA: Diagnosis not present

## 2022-02-26 DIAGNOSIS — R1110 Vomiting, unspecified: Secondary | ICD-10-CM | POA: Diagnosis not present

## 2022-02-26 DIAGNOSIS — I700 Atherosclerosis of aorta: Secondary | ICD-10-CM | POA: Diagnosis not present

## 2022-02-26 LAB — CBC WITH DIFFERENTIAL/PLATELET
Abs Immature Granulocytes: 0.04 10*3/uL (ref 0.00–0.07)
Basophils Absolute: 0 10*3/uL (ref 0.0–0.1)
Basophils Relative: 0 %
Eosinophils Absolute: 0 10*3/uL (ref 0.0–0.5)
Eosinophils Relative: 0 %
HCT: 45.7 % (ref 36.0–46.0)
Hemoglobin: 16 g/dL — ABNORMAL HIGH (ref 12.0–15.0)
Immature Granulocytes: 1 %
Lymphocytes Relative: 10 %
Lymphs Abs: 0.8 10*3/uL (ref 0.7–4.0)
MCH: 31 pg (ref 26.0–34.0)
MCHC: 35 g/dL (ref 30.0–36.0)
MCV: 88.6 fL (ref 80.0–100.0)
Monocytes Absolute: 0.5 10*3/uL (ref 0.1–1.0)
Monocytes Relative: 5 %
Neutro Abs: 7.1 10*3/uL (ref 1.7–7.7)
Neutrophils Relative %: 84 %
Platelets: 333 10*3/uL (ref 150–400)
RBC: 5.16 MIL/uL — ABNORMAL HIGH (ref 3.87–5.11)
RDW: 12.8 % (ref 11.5–15.5)
WBC: 8.4 10*3/uL (ref 4.0–10.5)
nRBC: 0 % (ref 0.0–0.2)

## 2022-02-26 LAB — RESP PANEL BY RT-PCR (FLU A&B, COVID) ARPGX2
Influenza A by PCR: NEGATIVE
Influenza B by PCR: NEGATIVE
SARS Coronavirus 2 by RT PCR: NEGATIVE

## 2022-02-26 LAB — URINALYSIS, ROUTINE W REFLEX MICROSCOPIC
Bacteria, UA: NONE SEEN
Bacteria, UA: NONE SEEN
Bilirubin Urine: NEGATIVE
Bilirubin Urine: NEGATIVE
Glucose, UA: NEGATIVE mg/dL
Glucose, UA: 50 mg/dL — AB
Hgb urine dipstick: NEGATIVE
Ketones, ur: 5 mg/dL — AB
Ketones, ur: 80 mg/dL — AB
Leukocytes,Ua: NEGATIVE
Leukocytes,Ua: NEGATIVE
Nitrite: NEGATIVE
Nitrite: NEGATIVE
Protein, ur: 30 mg/dL — AB
Protein, ur: >=300 mg/dL — AB
Specific Gravity, Urine: 1.023 (ref 1.005–1.030)
Specific Gravity, Urine: 1.018 (ref 1.005–1.030)
pH: 5 (ref 5.0–8.0)
pH: 7 (ref 5.0–8.0)

## 2022-02-26 LAB — TROPONIN I (HIGH SENSITIVITY)
Troponin I (High Sensitivity): 5 ng/L (ref <18)
Troponin I (High Sensitivity): 6 ng/L (ref <18)

## 2022-02-26 LAB — COMPREHENSIVE METABOLIC PANEL
ALT: 18 U/L (ref 0–44)
AST: 18 U/L (ref 15–41)
Albumin: 4.2 g/dL (ref 3.5–5.0)
Alkaline Phosphatase: 74 U/L (ref 38–126)
Anion gap: 14 (ref 5–15)
BUN: 9 mg/dL (ref 8–23)
CO2: 24 mmol/L (ref 22–32)
Calcium: 9.2 mg/dL (ref 8.9–10.3)
Chloride: 93 mmol/L — ABNORMAL LOW (ref 98–111)
Creatinine, Ser: 0.62 mg/dL (ref 0.44–1.00)
GFR, Estimated: >60 mL/min (ref >60)
Glucose, Bld: 139 mg/dL — ABNORMAL HIGH (ref 70–99)
Potassium: 3.5 mmol/L (ref 3.5–5.1)
Sodium: 131 mmol/L — ABNORMAL LOW (ref 135–145)
Total Bilirubin: 1.3 mg/dL — ABNORMAL HIGH (ref 0.3–1.2)
Total Protein: 7.6 g/dL (ref 6.5–8.1)

## 2022-02-26 LAB — LIPASE, BLOOD: Lipase: 25 U/L (ref 11–51)

## 2022-02-26 MED ORDER — ONDANSETRON HCL 4 MG PO TABS: 4.0000 mg | ORAL_TABLET | Freq: Every day | ORAL | 1 refills | Status: DC | PRN

## 2022-02-26 MED ORDER — IOHEXOL 300 MG/ML  SOLN
100.0000 mL | Freq: Once | INTRAMUSCULAR | Status: AC | PRN
  Administered 2022-02-26: 100 mL via INTRAVENOUS

## 2022-02-26 MED ORDER — FAMOTIDINE IN NACL 20-0.9 MG/50ML-% IV SOLN
20.0000 mg | Freq: Once | INTRAVENOUS | Status: AC
  Administered 2022-02-27: 20 mg via INTRAVENOUS
  Filled 2022-02-26: qty 50

## 2022-02-26 NOTE — Discharge Instructions (Signed)
Take zofran as needed for nausea.  Drink plenty of fluids to stay well-hydrated, find Pedialyte or similar electrolyte rehydration formula at your local pharmacy. Take Tylenol 650 mg every 6 hours for aches and pains.  Thank you for choosing [place] for your health care today!  Please see your primary doctor this week for a follow up appointment.   If you do not have a primary doctor call the following clinics to establish care:  If you have insurance:  [facility] [phone] [place] [address]   [facility]  [phone] [place] [place] [address]   If you do not have insurance:  [facility]  [phone] [address], [place] [address]  Sometimes, in the early stages of certain disease courses it is difficult to detect in the emergency department evaluation -- so, it is important that you continue to monitor your symptoms and call your doctor right away or return to the emergency department if you develop any new or worsening symptoms.  It was my pleasure to care for you today.   Hoover Brunette Jacelyn Grip, MD

## 2022-02-26 NOTE — ED Triage Notes (Signed)
Pt BIB [facility], c/o epigastric pain, nausea and vomiting. Symptoms have been recurrent for the last few years. Given '4mg'$  IV zofran pta.

## 2022-02-26 NOTE — ED Notes (Signed)
Patient transported to CT 

## 2022-02-26 NOTE — ED Provider Triage Note (Signed)
  Emergency Medicine Provider Triage Evaluation Note  MRN:  [id number]  Arrival date & time: 02/26/22    Medically screening exam initiated at 10:37 PM.   CC:   Emesis   HPI:  Tiffany Orozco is a 71 y.o. year-old female presents to the ED with chief complaint of persistent nausea and vomiting.  Denies fevers or chills.  Reports that this is a recurrent issue.  Reportedly, had an EGD attempt remotely, but had to terminate procedure due to esophageal stricture.  History provided by patient. ROS:  -As included in HPI PE:   Vitals:   02/26/22 2236  BP: (!) 143/121  Pulse: 91  Resp: 18  Temp: 98.3 F (36.8 C)  SpO2: 97%    Non-toxic appearing No respiratory distress  MDM:  Based on signs and symptoms, esophagitis, gastritis, PUD, stricture is highest on my differential, followed by obstruction. I've ordered labs and pepcid in triage to expedite lab/diagnostic workup.  CT done yesterday for the same symptoms was negative.  Patient was informed that the remainder of the evaluation will be completed by another provider, this initial triage assessment does not replace that evaluation, and the importance of remaining in the ED until their evaluation is complete.    Montine Circle, PA-C 02/26/22 2239

## 2022-02-26 NOTE — ED Notes (Signed)
Signing pad did not work, pt and her daughter verbalized understanding of DC instructions.

## 2022-02-26 NOTE — ED Provider Notes (Signed)
[facility] Provider Note    Event Date/Time   First MD Initiated Contact with Patient 02/25/22 2259     (approximate)   History   Nausea and Emesis   HPI  Tiffany Orozco is a 71 y.o. female   Past medical history of chronic hyponatremia, prior stroke, COPD, hypothyroid, presents with nausea, vomiting, diarrhea in the setting of subjective fever and cough for the last 2 days.  Of note, she has had recurrent episodes of nausea vomiting diarrhea intermittently throughout the last year.  Denies chest pain, shortness of breath .  No blood in the stools.  Denies urinary symptoms or vaginal bleeding.  Her daughter is at bedside and states that her mother has been fatigued, complaining of myalgias and headache as well.  She is fully vaccinated and also has been vaccinated for COVID.  No known sick contacts or travel.  History was obtained via patient and her daughter at bedside, as well as review of external medical notes including emergency department visit this summer for nausea vomiting diarrhea where she was found to have duodenitis on CT scan.      Physical Exam   Triage Vital Signs: ED Triage Vitals  Enc Vitals Group     BP 02/25/22 1759 (!) 141/86     Pulse Rate 02/25/22 1759 94     Resp 02/25/22 1759 20     Temp 02/25/22 1759 98.3 F (36.8 C)     Temp src --      SpO2 02/25/22 1759 93 %     Weight 02/25/22 1801 165 lb (74.8 kg)     Height --      Head Circumference --      Peak Flow --      Pain Score 02/25/22 1801 9     Pain Loc --      Pain Edu? --      Excl. in [place]? --     Most recent vital signs: Vitals:   02/26/22 0200 02/26/22 0230  BP: (!) 153/65 (!) 165/71  Pulse: 77 85  Resp: 18 16  Temp:    SpO2: 95% 96%    General: Awake, no distress.  Tired appearing but responsive, alert, cooperative and pleasant. CV:  mucus membranes are dry poor skin turgor Resp:  Normal effort.  Clear Abd:  No distention.  No Rigidity or guarding,  mild tenderness diffuse    ED Results / Procedures / Treatments   Labs (all labs ordered are listed, but only abnormal results are displayed) Labs Reviewed  CBC WITH DIFFERENTIAL/PLATELET - Abnormal; Notable for the following components:      Result Value   WBC 11.9 (*)    RBC 5.20 (*)    Hemoglobin 15.6 (*)    HCT 46.3 (*)    Neutro Abs 8.5 (*)    Monocytes Absolute 1.3 (*)    All other components within normal limits  COMPREHENSIVE METABOLIC PANEL - Abnormal; Notable for the following components:   Sodium 129 (*)    Chloride 95 (*)    Glucose, Bld 132 (*)    All other components within normal limits  URINALYSIS, ROUTINE W REFLEX MICROSCOPIC - Abnormal; Notable for the following components:   Color, Urine YELLOW (*)    APPearance CLOUDY (*)    Ketones, ur 5 (*)    Protein, ur 30 (*)    All other components within normal limits  RESP PANEL BY RT-PCR (FLU A&B, COVID) ARPGX2  LIPASE, BLOOD  TROPONIN I (HIGH SENSITIVITY)  TROPONIN I (HIGH SENSITIVITY)     I reviewed labs and they are notable for mild leukocytosis 11.9, baseline sodium 129  EKG  ED ECG REPORT I, Lucillie Garfinkel, the attending physician, personally viewed and interpreted this ECG.   Date: 02/26/2022  EKG Time: 2347  Rate: 71  Rhythm: normal EKG, normal sinus rhythm  Axis: normal  Intervals:none  ST&T Change: no ischemic changes    RADIOLOGY I independently reviewed and interpreted chest x-ray and see no obvious focal opacities or pneumothorax   PROCEDURES:  Critical Care performed: No  Procedures   MEDICATIONS ORDERED IN ED: Medications  sodium chloride 0.9 % bolus 500 mL (0 mLs Intravenous Stopped 02/25/22 1908)  ondansetron (ZOFRAN) injection 4 mg (4 mg Intravenous Given 02/25/22 1827)  sodium chloride 0.9 % bolus 1,000 mL (0 mLs Intravenous Stopped 02/26/22 0100)  acetaminophen (TYLENOL) tablet 1,000 mg (1,000 mg Oral Given 02/26/22 0002)  ibuprofen (ADVIL) tablet 400 mg (400 mg Oral Given  02/26/22 0002)  iohexol (OMNIPAQUE) 300 MG/ML solution 100 mL (100 mLs Intravenous Contrast Given 02/26/22 0125)    IMPRESSION / MDM / ASSESSMENT AND PLAN / ED COURSE  I reviewed the triage vital signs and the nursing notes.                              Differential diagnosis includes, but is not limited to, viral upper respiratory infection, intra-abdominal infection, ACS, electrolyte derangement or AKI.   The patient is on the cardiac monitor to evaluate for evidence of arrhythmia and/or significant heart rate changes.  MDM: Patient with nausea and vomiting and abdominal pain, consider intra-abdominal infection we will obtain a CT scan of the abdomen pelvis with IV contrast.  She appears dehydrated will give IV fluids and Zofran for her nausea.  Consider atypical chest pain with nausea in the 71 year old, get EKG and troponins.  Also complaining of cough and subjective fever, consider respiratory infection, viral upper respiratory infection or bacterial pneumonia we will get viral swabs and CXR.  Dispo: After careful consideration of this patient's presentation, medical and social risk factors, and evaluation in the emergency department I engaged in shared decision making with the patient and/or their representative to consider admission or observation and this patient was ultimately discharged because improved with medications and fluid, patient stable in the emergency department with negative work-up as above, plan for close follow-up with PMD, anticipatory guidance given and return precautions understood by patient and family..   Patient's presentation is most consistent with acute presentation with potential threat to life or bodily function.       FINAL CLINICAL IMPRESSION(S) / ED DIAGNOSES   Final diagnoses:  Nausea and vomiting, unspecified vomiting type  Acute cough  Myalgia     Rx / DC Orders   ED Discharge Orders          Ordered    ondansetron (ZOFRAN) 4 MG tablet   Daily PRN        02/26/22 0235             Note:  This document was prepared using Dragon voice recognition software and may include unintentional dictation errors.    Lucillie Garfinkel, MD 02/26/22 [phone]

## 2022-02-27 DIAGNOSIS — R112 Nausea with vomiting, unspecified: Secondary | ICD-10-CM | POA: Diagnosis not present

## 2022-02-27 MED ORDER — ONDANSETRON 4 MG PO TBDP: 4.0000 mg | ORAL_TABLET | Freq: Three times a day (TID) | ORAL | 0 refills | Status: DC | PRN

## 2022-02-27 MED ORDER — ONDANSETRON HCL 4 MG/2ML IJ SOLN
4.0000 mg | Freq: Once | INTRAMUSCULAR | Status: AC
  Administered 2022-02-27: 4 mg via INTRAVENOUS
  Filled 2022-02-27: qty 2

## 2022-02-27 MED ORDER — SODIUM CHLORIDE 0.9 % IV BOLUS
500.0000 mL | Freq: Once | INTRAVENOUS | Status: AC
  Administered 2022-02-27: 500 mL via INTRAVENOUS

## 2022-02-27 MED ORDER — FAMOTIDINE IN NACL 20-0.9 MG/50ML-% IV SOLN
20.0000 mg | Freq: Once | INTRAVENOUS | Status: AC
  Administered 2022-02-27: 20 mg via INTRAVENOUS
  Filled 2022-02-27: qty 50

## 2022-02-27 NOTE — ED Notes (Signed)
Pt brought back to triage, dry heaving, BP rechecked, IV pepcid started. A&Ox4, asking for water.

## 2022-02-27 NOTE — Discharge Instructions (Signed)
Zofran dissolvable tablets as prescribed.  Place this in your mouth, it can dissolve under your tongue or in your cheek.  You can also try Pepcid, this was provided to you today and your IV.  The over-the-counter medication is the same strength.  Follow-up with your gastroenterologist (GI doctor), call on Monday to schedule an appointment.  Return to the emergency room if you continue to vomit and are unable to keep anything down or for any worsening or concerning symptoms.

## 2022-02-27 NOTE — ED Provider Notes (Signed)
[facility] EMERGENCY DEPARTMENT Provider Note   CSN: [id number] Arrival date & time: 02/26/22  2231     History  Chief Complaint  Patient presents with   Emesis    Tiffany Orozco is a 71 y.o. female.  71 year old female brought in by her daughter from home with ongoing vomiting.  Patient has a history of recurrent vomiting/regurgitation problems starting around 2017.  Has had endoscopy revealing a small stricture previously but states she has not had a recent endoscopy.  Patient notes that her symptoms started on Thursday, went to [facility] regional hospital last night where she was given IV fluids, had labs and a CT and was discharged home.  Patient has been using her Zofran at home without improvement, unable to keep anything down.  She is describing what sounds like vomiting as well as regurgitation at times.  She does not feel she has anything impacted or stuck at this time.  Last ate solid food on Wednesday when she had a [place] sandwich and felt like it went down fine.        Home Medications Prior to Admission medications   Medication Sig Start Date End Date Taking? Authorizing Provider  ondansetron (ZOFRAN-ODT) 4 MG disintegrating tablet Take 1 tablet (4 mg total) by mouth every 8 (eight) hours as needed for nausea or vomiting. 02/27/22  Yes Tacy Learn, PA-C  ARMOUR THYROID 90 MG tablet Take 1 tablet every other day alternating with 60 mg. 11/30/21   Bedsole, Amy E, MD  Cholecalciferol (VITAMIN D3) 125 MCG (5000 UT) TABS Take 5,000 Units by mouth daily.    [provider]  dipyridamole-aspirin (AGGRENOX) 200-25 MG 12hr capsule TAKE 1 CAPSULE BY MOUTH TWICE A DAY 08/31/21   Bedsole, Amy E, MD  ezetimibe (ZETIA) 10 MG tablet Take 1 tablet (10 mg total) by mouth daily. 08/27/21   Bedsole, Amy E, MD  FLAREX 0.1 % ophthalmic suspension Apply to eye. 01/11/22   [provider]  fluticasone (FLONASE) 50 MCG/ACT nasal spray SPRAY 2 SPRAYS INTO EACH  NOSTRIL EVERY DAY 03/03/21   Bedsole, Amy E, MD  ketoconazole (NIZORAL) 2 % shampoo Apply 1 application. topically 2 (two) times a week. 08/27/21   Bedsole, Amy E, MD  pantoprazole (PROTONIX) 40 MG tablet Take 1 tablet (40 mg total) by mouth daily. 01/22/22 02/21/22  Bedsole, Amy E, MD  sucralfate (CARAFATE) 1 g tablet Take 1 tablet (1 g total) by mouth 3 (three) times daily as needed. 01/22/22   Jinny Sanders, MD  thyroid (ARMOUR THYROID) 60 MG tablet Take 1 tablet every other day alternating with 90 mg. 11/30/21   Diona Browner, Amy E, MD  triamcinolone cream (KENALOG) 0.1 % SMARTSIG:sparingly Topical Twice Daily PRN 11/24/21   [provider]      Allergies    Lisinopril, Niacin, Welchol [colesevelam hcl], Augmentin [amoxicillin-pot clavulanate], Codeine, and Oxycodone hcl    Review of Systems   Review of Systems Negative except as per HPI Physical Exam Updated Vital Signs BP (!) 172/87   Pulse 83   Temp 98.1 F (36.7 C)   Resp 18   SpO2 95%  Physical Exam Vitals and nursing note reviewed.  Constitutional:      General: She is not in acute distress.    Appearance: She is well-developed. She is not diaphoretic.  HENT:     Head: Normocephalic and atraumatic.     Mouth/Throat:     Mouth: Mucous membranes are moist.  Eyes:     Conjunctiva/sclera: Conjunctivae normal.  Cardiovascular:     Rate and Rhythm: Normal rate and regular rhythm.     Heart sounds: Normal heart sounds.  Pulmonary:     Effort: Pulmonary effort is normal.     Breath sounds: Normal breath sounds.  Abdominal:     Palpations: Abdomen is soft.     Tenderness: There is no abdominal tenderness.  Musculoskeletal:     Cervical back: Neck supple.     Right lower leg: No edema.     Left lower leg: No edema.  Skin:    General: Skin is warm and dry.     Findings: No erythema or rash.  Neurological:     Mental Status: She is alert and oriented to person, place, and time.  Psychiatric:        Behavior: Behavior  normal.     ED Results / Procedures / Treatments   Labs (all labs ordered are listed, but only abnormal results are displayed) Labs Reviewed  COMPREHENSIVE METABOLIC PANEL - Abnormal; Notable for the following components:      Result Value   Sodium 131 (*)    Chloride 93 (*)    Glucose, Bld 139 (*)    Total Bilirubin 1.3 (*)    All other components within normal limits  CBC WITH DIFFERENTIAL/PLATELET - Abnormal; Notable for the following components:   RBC 5.16 (*)    Hemoglobin 16.0 (*)    All other components within normal limits  URINALYSIS, ROUTINE W REFLEX MICROSCOPIC - Abnormal; Notable for the following components:   APPearance HAZY (*)    Glucose, UA 50 (*)    Hgb urine dipstick SMALL (*)    Ketones, ur 80 (*)    Protein, ur >=300 (*)    All other components within normal limits  LIPASE, BLOOD    EKG None  Radiology CT Abdomen Pelvis W Contrast  Result Date: 02/26/2022 CLINICAL DATA:  Nausea, vomiting, abdominal pain EXAM: CT ABDOMEN AND PELVIS WITH CONTRAST TECHNIQUE: Multidetector CT imaging of the abdomen and pelvis was performed using the standard protocol following bolus administration of intravenous contrast. RADIATION DOSE REDUCTION: This exam was performed according to the departmental dose-optimization program which includes automated exposure control, adjustment of the mA and/or kV according to patient size and/or use of iterative reconstruction technique. CONTRAST:  136m OMNIPAQUE IOHEXOL 300 MG/ML  SOLN COMPARISON:  12/22/2021 FINDINGS: Lower chest: No acute abnormality Hepatobiliary: No focal liver abnormality is seen. Status post cholecystectomy. No biliary dilatation. Pancreas: No focal abnormality or ductal dilatation. Spleen: No focal abnormality.  Normal size. Adrenals/Urinary Tract: No adrenal abnormality. No focal renal abnormality. No stones or hydronephrosis. Urinary bladder is unremarkable. Stomach/Bowel: Stomach, large and small bowel grossly  unremarkable. Vascular/Lymphatic: Aortic atherosclerosis. No evidence of aneurysm or adenopathy. Reproductive: Uterus and adnexa unremarkable.  No mass. Other: No free fluid or free air. Musculoskeletal: No acute bony abnormality. IMPRESSION: No acute findings in the abdomen or pelvis. Aortic atherosclerosis. Electronically Signed   By: KRolm BaptiseM.D.   On: 02/26/2022 01:42   DG Chest 2 View  Result Date: 02/26/2022 CLINICAL DATA:  Cough EXAM: CHEST - 2 VIEW COMPARISON:  12/22/2021 FINDINGS: Heart and mediastinal contours are within normal limits. No focal opacities or effusions. No acute bony abnormality. IMPRESSION: No active cardiopulmonary disease. Electronically Signed   By: KRolm BaptiseM.D.   On: 02/26/2022 00:02    Procedures Procedures    Medications Ordered in ED Medications  famotidine (PEPCID) IVPB 20 mg premix (0 mg Intravenous Stopped 02/27/22 0333)  famotidine (PEPCID) IVPB 20 mg premix (0 mg Intravenous Stopped 02/27/22 1241)  ondansetron (ZOFRAN) injection 4 mg (4 mg Intravenous Given 02/27/22 1209)  sodium chloride 0.9 % bolus 500 mL (0 mLs Intravenous Stopped 02/27/22 1402)    ED Course/ Medical Decision Making/ A&P                           Medical Decision Making Risk Prescription drug management.   This patient presents to the ED for concern of vomiting, this involves an extensive number of treatment options, and is a complaint that carries with it a high risk of complications and morbidity.  The differential diagnosis includes but not limited to gastritis, gastroparesis, esophageal stricture, peptic ulcer disease, GERD   Co morbidities that complicate the patient evaluation  IBS, CVA, peptic ulcer disease, intractable vomiting, hypothyroid, depression, GERD, hypertension, dysphagia   Additional history obtained:  Additional history obtained from daughter at bedside who contributes to history as above External records from outside source obtained and reviewed  including visit to [facility] regional ER yesterday, review of lab work as well as CT abdomen pelvis which did not show any acute findings to suggest source for patient's symptoms.   Lab Tests:  I Ordered, and personally interpreted labs.  The pertinent results include: CBC with mildly elevated hemoglobin otherwise unremarkable.  Urinalysis is positive for glucose, ketones, protein without evidence of UTI.  CMP without significant findings.  Lipase normal.   Consultations Obtained:  I requested consultation with the ER attending, Dr. Tomi Bamberger,  and discussed lab and imaging findings as well as pertinent plan - they recommend: If patient is agreeable to discharge and able to tolerate p.o.'s, may be discharged to follow-up with her GI and PCP teams.   Problem List / ED Course / Critical interventions / Medication management  71 year old female brought in by daughter with concern for ongoing vomiting.  Reports episodic in nature, onset on Thursday, seen at [facility] regional yesterday with thorough work-up to include lab evaluation and CT without source found.  Ultimately, patient was discharged home with prescription for Zofran tablets.  Patient took the tablet however continued to vomit.  Patient does not have ODT Zofran.  Patient was given Pepcid and Zofran in the ER, after an extensive stay due to prolonged wait, symptoms have resolved with fluids, Zofran IV and Pepcid.  Patient is agreeable with the plan for discharge home at this time, tolerating small sips of water, applesauce and crackers with plan to follow-up with her GI doctor.  She is provided with a prescription for Zofran ODT.  She will also continue to take Pepcid.  Patient return to ER should vomiting return or her symptoms progress. I ordered medication including Zofran, Pepcid, IV fluids for nausea and vomiting Reevaluation of the patient after these medicines showed that the patient resolved I have reviewed the patients home medicines and  have made adjustments as needed   Social Determinants of Health:  Lives with daughter, has PCP and specialty care team for follow-up   Test / Admission - Considered:  Consider repeat CT abdomen pelvis however this was just obtained in the past 48 hours and was unremarkable at that time.  No significant changes to lab work to suggest need for repeat imaging. Offered admission however symptoms have resolved, patient is improved and requesting discharge.  Final Clinical Impression(s) / ED Diagnoses Final diagnoses:  Nausea and vomiting, unspecified vomiting type    Rx / DC Orders ED Discharge Orders          Ordered    ondansetron (ZOFRAN-ODT) 4 MG disintegrating tablet  Every 8 hours PRN        02/27/22 1445              Tacy Learn, PA-C 02/27/22 1458    Dorie Rank, MD 02/27/22 [phone]

## 2022-02-28 ENCOUNTER — Encounter: Payer: Self-pay | Admitting: Intensive Care

## 2022-02-28 ENCOUNTER — Other Ambulatory Visit: Payer: Self-pay

## 2022-02-28 ENCOUNTER — Inpatient Hospital Stay
Admission: EM | Admit: 2022-02-28 | Discharge: 2022-03-02 | DRG: 392 | Disposition: A | Discharge status: Home / Self Care | Payer: Medicare Other | Attending: Internal Medicine | Admitting: Internal Medicine

## 2022-02-28 DIAGNOSIS — I10 Essential (primary) hypertension: Secondary | ICD-10-CM | POA: Diagnosis present

## 2022-02-28 DIAGNOSIS — R1115 Cyclical vomiting syndrome unrelated to migraine: Secondary | ICD-10-CM | POA: Diagnosis present

## 2022-02-28 DIAGNOSIS — K2970 Gastritis, unspecified, without bleeding: Secondary | ICD-10-CM | POA: Diagnosis present

## 2022-02-28 DIAGNOSIS — E860 Dehydration: Secondary | ICD-10-CM | POA: Diagnosis present

## 2022-02-28 DIAGNOSIS — Z8673 Personal history of transient ischemic attack (TIA), and cerebral infarction without residual deficits: Secondary | ICD-10-CM

## 2022-02-28 DIAGNOSIS — Z8711 Personal history of peptic ulcer disease: Secondary | ICD-10-CM

## 2022-02-28 DIAGNOSIS — Z885 Allergy status to narcotic agent status: Secondary | ICD-10-CM

## 2022-02-28 DIAGNOSIS — F32A Depression, unspecified: Secondary | ICD-10-CM | POA: Diagnosis present

## 2022-02-28 DIAGNOSIS — E871 Hypo-osmolality and hyponatremia: Secondary | ICD-10-CM | POA: Diagnosis present

## 2022-02-28 DIAGNOSIS — E876 Hypokalemia: Secondary | ICD-10-CM | POA: Diagnosis not present

## 2022-02-28 DIAGNOSIS — K580 Irritable bowel syndrome with diarrhea: Secondary | ICD-10-CM | POA: Diagnosis present

## 2022-02-28 DIAGNOSIS — Z800 Family history of malignant neoplasm of digestive organs: Secondary | ICD-10-CM

## 2022-02-28 DIAGNOSIS — Z9049 Acquired absence of other specified parts of digestive tract: Secondary | ICD-10-CM

## 2022-02-28 DIAGNOSIS — F419 Anxiety disorder, unspecified: Secondary | ICD-10-CM | POA: Diagnosis present

## 2022-02-28 DIAGNOSIS — I2510 Atherosclerotic heart disease of native coronary artery without angina pectoris: Secondary | ICD-10-CM | POA: Diagnosis present

## 2022-02-28 DIAGNOSIS — Z720 Tobacco use: Secondary | ICD-10-CM | POA: Diagnosis present

## 2022-02-28 DIAGNOSIS — K279 Peptic ulcer, site unspecified, unspecified as acute or chronic, without hemorrhage or perforation: Secondary | ICD-10-CM | POA: Diagnosis present

## 2022-02-28 DIAGNOSIS — K267 Chronic duodenal ulcer without hemorrhage or perforation: Secondary | ICD-10-CM | POA: Diagnosis present

## 2022-02-28 DIAGNOSIS — R112 Nausea with vomiting, unspecified: Secondary | ICD-10-CM | POA: Diagnosis not present

## 2022-02-28 DIAGNOSIS — F17210 Nicotine dependence, cigarettes, uncomplicated: Secondary | ICD-10-CM | POA: Diagnosis present

## 2022-02-28 DIAGNOSIS — Z1152 Encounter for screening for COVID-19: Secondary | ICD-10-CM

## 2022-02-28 DIAGNOSIS — Z79899 Other long term (current) drug therapy: Secondary | ICD-10-CM

## 2022-02-28 DIAGNOSIS — J439 Emphysema, unspecified: Secondary | ICD-10-CM | POA: Diagnosis present

## 2022-02-28 DIAGNOSIS — Z79890 Hormone replacement therapy (postmenopausal): Secondary | ICD-10-CM

## 2022-02-28 DIAGNOSIS — K2100 Gastro-esophageal reflux disease with esophagitis, without bleeding: Principal | ICD-10-CM | POA: Diagnosis present

## 2022-02-28 DIAGNOSIS — Z888 Allergy status to other drugs, medicaments and biological substances status: Secondary | ICD-10-CM

## 2022-02-28 DIAGNOSIS — Z8249 Family history of ischemic heart disease and other diseases of the circulatory system: Secondary | ICD-10-CM

## 2022-02-28 DIAGNOSIS — E039 Hypothyroidism, unspecified: Secondary | ICD-10-CM | POA: Diagnosis present

## 2022-02-28 DIAGNOSIS — K222 Esophageal obstruction: Secondary | ICD-10-CM | POA: Diagnosis present

## 2022-02-28 DIAGNOSIS — Z801 Family history of malignant neoplasm of trachea, bronchus and lung: Secondary | ICD-10-CM

## 2022-02-28 DIAGNOSIS — Z808 Family history of malignant neoplasm of other organs or systems: Secondary | ICD-10-CM

## 2022-02-28 DIAGNOSIS — Z881 Allergy status to other antibiotic agents status: Secondary | ICD-10-CM

## 2022-02-28 LAB — URINALYSIS, ROUTINE W REFLEX MICROSCOPIC
Bilirubin Urine: NEGATIVE
Glucose, UA: NEGATIVE mg/dL
Ketones, ur: 20 mg/dL — AB
Leukocytes,Ua: NEGATIVE
Nitrite: NEGATIVE
Protein, ur: 100 mg/dL — AB
Specific Gravity, Urine: 1.011 (ref 1.005–1.030)
pH: 6 (ref 5.0–8.0)

## 2022-02-28 LAB — COMPREHENSIVE METABOLIC PANEL
ALT: 20 U/L (ref 0–44)
AST: 22 U/L (ref 15–41)
Albumin: 4.6 g/dL (ref 3.5–5.0)
Alkaline Phosphatase: 85 U/L (ref 38–126)
Anion gap: 14 (ref 5–15)
BUN: 11 mg/dL (ref 8–23)
CO2: 24 mmol/L (ref 22–32)
Calcium: 9.2 mg/dL (ref 8.9–10.3)
Chloride: 91 mmol/L — ABNORMAL LOW (ref 98–111)
Creatinine, Ser: 0.6 mg/dL (ref 0.44–1.00)
GFR, Estimated: >60 mL/min (ref >60)
Glucose, Bld: 121 mg/dL — ABNORMAL HIGH (ref 70–99)
Potassium: 3.2 mmol/L — ABNORMAL LOW (ref 3.5–5.1)
Sodium: 129 mmol/L — ABNORMAL LOW (ref 135–145)
Total Bilirubin: 1.3 mg/dL — ABNORMAL HIGH (ref 0.3–1.2)
Total Protein: 8.5 g/dL — ABNORMAL HIGH (ref 6.5–8.1)

## 2022-02-28 LAB — CBC
HCT: 47.2 % — ABNORMAL HIGH (ref 36.0–46.0)
Hemoglobin: 16.6 g/dL — ABNORMAL HIGH (ref 12.0–15.0)
MCH: 30 pg (ref 26.0–34.0)
MCHC: 35.2 g/dL (ref 30.0–36.0)
MCV: 85.4 fL (ref 80.0–100.0)
Platelets: 398 10*3/uL (ref 150–400)
RBC: 5.53 MIL/uL — ABNORMAL HIGH (ref 3.87–5.11)
RDW: 12.4 % (ref 11.5–15.5)
WBC: 9.8 10*3/uL (ref 4.0–10.5)
nRBC: 0 % (ref 0.0–0.2)

## 2022-02-28 LAB — LIPASE, BLOOD: Lipase: 25 U/L (ref 11–51)

## 2022-02-28 MED ORDER — SODIUM CHLORIDE 0.9 % IV BOLUS
1000.0000 mL | Freq: Once | INTRAVENOUS | Status: AC
  Administered 2022-02-28: 1000 mL via INTRAVENOUS

## 2022-02-28 MED ORDER — ALUM & MAG HYDROXIDE-SIMETH 200-200-20 MG/5ML PO SUSP
30.0000 mL | Freq: Once | ORAL | Status: AC
  Administered 2022-03-01: 30 mL via ORAL
  Filled 2022-02-28: qty 30

## 2022-02-28 MED ORDER — SODIUM CHLORIDE 0.9% FLUSH
3.0000 mL | Freq: Two times a day (BID) | INTRAVENOUS | Status: DC
  Administered 2022-02-28 – 2022-03-01 (×3): 3 mL via INTRAVENOUS

## 2022-02-28 MED ORDER — THYROID 60 MG PO TABS
90.0000 mg | ORAL_TABLET | ORAL | Status: DC
  Filled 2022-02-28: qty 1

## 2022-02-28 MED ORDER — ACETAMINOPHEN 325 MG PO TABS
650.0000 mg | ORAL_TABLET | Freq: Four times a day (QID) | ORAL | Status: DC | PRN
  Administered 2022-03-02: 650 mg via ORAL
  Filled 2022-02-28: qty 2

## 2022-02-28 MED ORDER — SUCRALFATE 1 G PO TABS
1.0000 g | ORAL_TABLET | Freq: Three times a day (TID) | ORAL | Status: DC
  Administered 2022-02-28: 1 g via ORAL
  Filled 2022-02-28 (×3): qty 1

## 2022-02-28 MED ORDER — ONDANSETRON HCL 4 MG/2ML IJ SOLN
4.0000 mg | Freq: Four times a day (QID) | INTRAMUSCULAR | Status: DC | PRN
  Administered 2022-03-01 – 2022-03-02 (×4): 4 mg via INTRAVENOUS
  Filled 2022-02-28 (×4): qty 2

## 2022-02-28 MED ORDER — ONDANSETRON HCL 4 MG/2ML IJ SOLN
4.0000 mg | Freq: Once | INTRAMUSCULAR | Status: AC
  Administered 2022-02-28: 4 mg via INTRAVENOUS
  Filled 2022-02-28: qty 2

## 2022-02-28 MED ORDER — NICOTINE 21 MG/24HR TD PT24
21.0000 mg | MEDICATED_PATCH | Freq: Every day | TRANSDERMAL | Status: DC
  Administered 2022-02-28 – 2022-03-02 (×3): 21 mg via TRANSDERMAL
  Filled 2022-02-28 (×3): qty 1

## 2022-02-28 MED ORDER — FENTANYL CITRATE PF 50 MCG/ML IJ SOSY
25.0000 ug | PREFILLED_SYRINGE | Freq: Four times a day (QID) | INTRAMUSCULAR | Status: DC | PRN
  Administered 2022-03-01 (×4): 25 ug via INTRAVENOUS
  Filled 2022-02-28 (×4): qty 1

## 2022-02-28 MED ORDER — HYDRALAZINE HCL 20 MG/ML IJ SOLN
5.0000 mg | INTRAMUSCULAR | Status: DC | PRN
  Administered 2022-03-01: 5 mg via INTRAVENOUS
  Filled 2022-02-28: qty 1

## 2022-02-28 MED ORDER — LACTATED RINGERS IV SOLN: INTRAVENOUS | Status: AC

## 2022-02-28 MED ORDER — HEPARIN SODIUM (PORCINE) 5000 UNIT/ML IJ SOLN
5000.0000 [IU] | Freq: Three times a day (TID) | INTRAMUSCULAR | Status: DC
  Administered 2022-02-28 – 2022-03-01 (×3): 5000 [IU] via SUBCUTANEOUS
  Filled 2022-02-28 (×3): qty 1

## 2022-02-28 MED ORDER — ASPIRIN-DIPYRIDAMOLE ER 25-200 MG PO CP12: 1.0000 | ORAL_CAPSULE | Freq: Two times a day (BID) | ORAL | Status: DC

## 2022-02-28 MED ORDER — THYROID 60 MG PO TABS
60.0000 mg | ORAL_TABLET | ORAL | Status: DC
  Administered 2022-03-01: 60 mg via ORAL
  Filled 2022-02-28: qty 1

## 2022-02-28 MED ORDER — PANTOPRAZOLE INFUSION (NEW) - SIMPLE MED
8.0000 mg/h | INTRAVENOUS | Status: DC
  Administered 2022-02-28 – 2022-03-02 (×5): 8 mg/h via INTRAVENOUS
  Filled 2022-02-28: qty 100
  Filled 2022-02-28: qty 80
  Filled 2022-02-28 (×2): qty 100

## 2022-02-28 MED ORDER — PANTOPRAZOLE 80MG IVPB - SIMPLE MED
80.0000 mg | Freq: Once | INTRAVENOUS | Status: AC
  Administered 2022-02-28: 80 mg via INTRAVENOUS
  Filled 2022-02-28: qty 100

## 2022-02-28 MED ORDER — PANTOPRAZOLE SODIUM 40 MG IV SOLR: 40.0000 mg | Freq: Two times a day (BID) | INTRAVENOUS | Status: DC

## 2022-02-28 MED ORDER — FLUTICASONE PROPIONATE 50 MCG/ACT NA SUSP
1.0000 | Freq: Every day | NASAL | Status: DC
  Administered 2022-03-01 – 2022-03-02 (×2): 1 via NASAL
  Filled 2022-02-28: qty 16

## 2022-02-28 MED ORDER — EZETIMIBE 10 MG PO TABS
10.0000 mg | ORAL_TABLET | Freq: Every day | ORAL | Status: DC
  Administered 2022-03-02: 10 mg via ORAL
  Filled 2022-02-28 (×2): qty 1

## 2022-02-28 MED ORDER — ACETAMINOPHEN 650 MG RE SUPP: 650.0000 mg | Freq: Four times a day (QID) | RECTAL | Status: DC | PRN

## 2022-02-28 NOTE — Assessment & Plan Note (Signed)
Continued on home regimen with Armour Thyroid 60 mg every other day alternating with 90 mg med verification still pending.

## 2022-02-28 NOTE — ED Triage Notes (Signed)
Patient presents with emesis. C/o pain abdominal pain and feeling bloated. Seen for same 02/26/22

## 2022-02-28 NOTE — ED Provider Notes (Signed)
[facility] Provider Note    Event Date/Time   First MD Initiated Contact with Patient 02/28/22 2027     (approximate)   History   Emesis   HPI  CORDIA MIKLOS is a 71 y.o. female who presents to the emergency department today because of concerns for continued nausea and vomiting.  Patient has had issues with nausea and vomiting over the course of years although acutely has been worse over the past few days.  This is now her third ED visit in the past week.  She states she has not able to keep down any food and is having a hard time keeping down her medications.  This is accompanied by abdominal pain.     Physical Exam   Triage Vital Signs: ED Triage Vitals  Enc Vitals Group     BP 02/28/22 1851 (!) 159/101     Pulse Rate 02/28/22 1851 (!) 102     Resp 02/28/22 1851 18     Temp 02/28/22 1851 98.2 F (36.8 C)     Temp Source 02/28/22 1851 Oral     SpO2 02/28/22 1851 98 %     Weight 02/28/22 1852 158 lb (71.7 kg)     Height 02/28/22 1852 5' (1.524 m)     Head Circumference --      Peak Flow --      Pain Score 02/28/22 1852 9     Pain Loc --      Pain Edu? --      Excl. in [place]? --     Most recent vital signs: Vitals:   02/28/22 1851  BP: (!) 159/101  Pulse: (!) 102  Resp: 18  Temp: 98.2 F (36.8 C)  SpO2: 98%    General: Awake, alert, oriented. CV:  Good peripheral perfusion. Regular rate and rhythm. Resp:  Normal effort. Lungs clear. Abd:  No distention. Diffusely tender to palpation.   ED Results / Procedures / Treatments   Labs (all labs ordered are listed, but only abnormal results are displayed) Labs Reviewed  COMPREHENSIVE METABOLIC PANEL - Abnormal; Notable for the following components:      Result Value   Sodium 129 (*)    Potassium 3.2 (*)    Chloride 91 (*)    Glucose, Bld 121 (*)    Total Protein 8.5 (*)    Total Bilirubin 1.3 (*)    All other components within normal limits  CBC - Abnormal; Notable for the  following components:   RBC 5.53 (*)    Hemoglobin 16.6 (*)    HCT 47.2 (*)    All other components within normal limits  URINALYSIS, ROUTINE W REFLEX MICROSCOPIC - Abnormal; Notable for the following components:   Color, Urine YELLOW (*)    APPearance HAZY (*)    Hgb urine dipstick MODERATE (*)    Ketones, ur 20 (*)    Protein, ur 100 (*)    Bacteria, UA MANY (*)    All other components within normal limits  LIPASE, BLOOD     EKG  None   RADIOLOGY None   PROCEDURES:  Critical Care performed: No  Procedures   MEDICATIONS ORDERED IN ED: Medications - No data to display   IMPRESSION / MDM / [place] / ED COURSE  I reviewed the triage vital signs and the nursing notes.  Differential diagnosis includes, but is not limited to, viral illness, pancreatitis, gastroenteritis.   Patient's presentation is most consistent with acute presentation with potential threat to life or bodily function.  Patient presented to the emergency department today because of concerns for continued nausea and vomiting.  This is now her third ER visit in the past week.  Blood work does show hyponatremia and hypokalemia consistent with poor oral intake.  Given continued symptoms and electrolyte abnormalities do think patient would benefit from further work-up and management.  Discussed with Dr. Posey Pronto with the hospitalist service will plan on admission.  FINAL CLINICAL IMPRESSION(S) / ED DIAGNOSES   Final diagnoses:  Nausea and vomiting, unspecified vomiting type      Note:  This document was prepared using Dragon voice recognition software and may include unintentional dictation errors.    Nance Pear, MD 02/28/22 2329

## 2022-02-28 NOTE — Hospital Course (Signed)
Admission request : Nausea/ vomiting. 3rd ED visit.

## 2022-02-28 NOTE — Assessment & Plan Note (Signed)
Currently stable. Continue as needed albuterol.

## 2022-02-28 NOTE — H&P (Signed)
History and Physical    Chief Complaint: Intractable N/V.   HISTORY OF PRESENT ILLNESS: Tiffany Orozco is an 71 y.o. female  coming 3rd time with n/v/ and abdominal pain. Pt has h/o gerd/ duodenitis/ esophagitis / stricture and dilatation / dysphagia/ duodenal ulcer/ bleeding and melena.    Pt has PMH as below: Past Medical History:  Diagnosis Date   Allergy    Anxiety    COPD (chronic obstructive pulmonary disease) ([place])    DDD (degenerative disc disease), lumbar    mild   Depression    Female rectocele without uterine prolapse    GERD (gastroesophageal reflux disease)    Hearing loss    Heart murmur    Hyperlipidemia    Hypertension    Hypothyroidism    IBS (irritable bowel syndrome)    Influenza A 06/2015   Obesity    Osteopenia    Plantar fasciitis    Pneumonia    Skin cancer of arm    Skin cancer of face    Stroke ([place]) 07/2010   no permenant deficits   Thrombocytopenia (HCC)    Tubal pregnancy     Review Of Systems: Review of Systems  Gastrointestinal:  Positive for abdominal pain, nausea and vomiting.  All other systems reviewed and are negative.    ALLERGIES: Allergies  Allergen Reactions   Lisinopril Shortness Of Breath   Niacin Other (See Comments)     dizziness   Welchol [Colesevelam Hcl] Diarrhea, Nausea Only, Swelling and Other (See Comments)     swelling in face   Augmentin [Amoxicillin-Pot Clavulanate] Nausea Only    GI upset with taking.   Codeine     keeps patient awake   Oxycodone Hcl     Patient states this medication makes her hyper    PAST SURGICAL HISTORY: Past Surgical History:  Procedure Laterality Date   APPENDECTOMY     CHOLECYSTECTOMY  2004   COLONOSCOPY     CYST EXCISION Right    breast mole   EMGs  7/04   atms and wrists neg   ESOPHAGEAL MANOMETRY N/A 03/17/2015   Procedure: ESOPHAGEAL MANOMETRY (EM);  Surgeon: Manus Gunning, MD;  Location: [facility];  Service: Gastroenterology;  Laterality: N/A;    FOOT SURGERY Right 2008, 2009, 2010   torn ligament, hammertoes   TUBAL LIGATION  1975   UPPER GI ENDOSCOPY     XI ROBOTIC ASSISTED VENTRAL HERNIA N/A 04/14/2020   Procedure: XI ROBOTIC ASSISTED VENTRAL HERNIA;  Surgeon: Fredirick Maudlin, MD;  Location: [facility];  Service: General;  Laterality: N/A;     SOCIAL HISTORY: Social History   Socioeconomic History   Marital status: Legally Separated    Spouse name: Shruthi Northrup   Number of children: 2   Years of education: Not on file   Highest education level: Not on file  Occupational History   Occupation: bookkeeper    Fish farm manager: UNEMPLOYED    Comment: 10/10 not working x 9 years  Tobacco Use   Smoking status: Every Day    Packs/day: 0.75    Years: 31.00    Total pack years: 23.25    Types: Cigarettes   Smokeless tobacco: Never   Tobacco comments:    States she is not smoking during current illness 05/29/18 sb  Vaping Use   Vaping Use: Never used  Substance and Sexual Activity   Alcohol use: No    Alcohol/week: 0.0 standard drinks of alcohol   Drug use: No  Sexual activity: Yes    Birth control/protection: Post-menopausal  Other Topics Concern   Not on file  Social History Narrative   15/10 husband had open heart surgery, not working, 2 grandchildren, siblings living and healthy   Social Determinants of Health   Financial Resource Strain: High Risk (04/09/2021)   Overall Financial Resource Strain (CARDIA)    Difficulty of Paying Living Expenses: Hard  Food Insecurity: No Food Insecurity (04/29/2021)   Hunger Vital Sign    Worried About Running Out of Food in the Last Year: Never true    Rudy in the Last Year: Never true  Recent Concern: Food Insecurity - Food Insecurity Present (04/09/2021)   Hunger Vital Sign    Worried About Running Out of Food in the Last Year: Sometimes true    Ran Out of Food in the Last Year: Sometimes true  Transportation Needs: No Transportation Needs (04/09/2021)   PRAPARE -  Hydrologist (Medical): No    Lack of Transportation (Non-Medical): No  Physical Activity: Sufficiently Active (04/09/2021)   Exercise Vital Sign    Days of Exercise per Week: 7 days    Minutes of Exercise per Session: 30 min  Stress: No Stress Concern Present (04/09/2021)   [place]    Feeling of Stress : Not at all  Social Connections: Socially Isolated (04/09/2021)   Social Connection and Isolation Panel [NHANES]    Frequency of Communication with Friends and Family: More than three times a week    Frequency of Social Gatherings with Friends and Family: More than three times a week    Attends Religious Services: Never    Marine scientist or Organizations: No    Attends Archivist Meetings: Never    Marital Status: Separated      CURRENT MEDS:  Current Facility-Administered Medications (Endocrine & Metabolic):    [START ON 67/10/7207] thyroid (ARMOUR) tablet 60 mg   [START ON 03/01/2022] thyroid (ARMOUR) tablet 90 mg  Current Outpatient Medications (Endocrine & Metabolic):    ARMOUR THYROID 90 MG tablet, Take 1 tablet every other day alternating with 60 mg.   thyroid (ARMOUR THYROID) 60 MG tablet, Take 1 tablet every other day alternating with 90 mg.  Current Facility-Administered Medications (Cardiovascular):    [START ON 03/01/2022] ezetimibe (ZETIA) tablet 10 mg   hydrALAZINE (APRESOLINE) injection 5 mg  Current Outpatient Medications (Cardiovascular):    ezetimibe (ZETIA) 10 MG tablet, Take 1 tablet (10 mg total) by mouth daily.  Current Facility-Administered Medications (Respiratory):    [START ON 03/01/2022] fluticasone (FLONASE) 50 MCG/ACT nasal spray 1 spray  Current Outpatient Medications (Respiratory):    fluticasone (FLONASE) 50 MCG/ACT nasal spray, SPRAY 2 SPRAYS INTO EACH NOSTRIL EVERY DAY  Current Facility-Administered Medications (Analgesics):     acetaminophen (TYLENOL) tablet 650 mg **OR** acetaminophen (TYLENOL) suppository 650 mg   Current Facility-Administered Medications (Hematological):    dipyridamole-aspirin (AGGRENOX) 200-25 MG per 12 hr capsule 1 capsule   heparin injection 5,000 Units  Current Outpatient Medications (Hematological):    dipyridamole-aspirin (AGGRENOX) 200-25 MG 12hr capsule, TAKE 1 CAPSULE BY MOUTH TWICE A DAY  Current Facility-Administered Medications (Other):    lactated ringers infusion   nicotine (NICODERM CQ - dosed in mg/24 hours) patch 21 mg   ondansetron (ZOFRAN) injection 4 mg   pantoprazole (PROTONIX) 80 mg /NS 100 mL IVPB   [START ON 03/04/2022] pantoprazole (PROTONIX) injection 40 mg  pantoprozole (PROTONIX) 80 mg /NS 100 mL infusion   sodium chloride flush (NS) 0.9 % injection 3 mL   sucralfate (CARAFATE) tablet 1 g  Current Outpatient Medications (Other):    Cholecalciferol (VITAMIN D3) 125 MCG (5000 UT) TABS, Take 5,000 Units by mouth daily.   FLAREX 0.1 % ophthalmic suspension, Apply to eye.   ketoconazole (NIZORAL) 2 % shampoo, Apply 1 application. topically 2 (two) times a week.   ondansetron (ZOFRAN-ODT) 4 MG disintegrating tablet, Take 1 tablet (4 mg total) by mouth every 8 (eight) hours as needed for nausea or vomiting.   pantoprazole (PROTONIX) 40 MG tablet, Take 1 tablet (40 mg total) by mouth daily.   sucralfate (CARAFATE) 1 g tablet, Take 1 tablet (1 g total) by mouth 3 (three) times daily as needed.   triamcinolone cream (KENALOG) 0.1 %, SMARTSIG:sparingly Topical Twice Daily PRN    ED Course: Pt in Ed A/O/Afebrile.ill appearing from dehydration and vomiting.  Vitals:   02/28/22 1851 02/28/22 1852  BP: (!) 159/101   Pulse: (!) 102   Resp: 18   Temp: 98.2 F (36.8 C)   TempSrc: Oral   SpO2: 98%   Weight:  71.7 kg  Height:  5' (1.524 m)   No intake/output data recorded. SpO2: 98 % Blood work in ed shows  Results for orders placed or performed during the hospital  encounter of 02/28/22 (from the past 24 hour(s))  Lipase, blood     Status: None   Collection Time: 02/28/22  6:57 PM  Result Value Ref Range   Lipase 25 11 - 51 U/L  Comprehensive metabolic panel     Status: Abnormal   Collection Time: 02/28/22  6:57 PM  Result Value Ref Range   Sodium 129 (L) 135 - 145 mmol/L   Potassium 3.2 (L) 3.5 - 5.1 mmol/L   Chloride 91 (L) 98 - 111 mmol/L   CO2 24 22 - 32 mmol/L   Glucose, Bld 121 (H) 70 - 99 mg/dL   BUN 11 8 - 23 mg/dL   Creatinine, Ser 0.60 0.44 - 1.00 mg/dL   Calcium 9.2 8.9 - 10.3 mg/dL   Total Protein 8.5 (H) 6.5 - 8.1 g/dL   Albumin 4.6 3.5 - 5.0 g/dL   AST 22 15 - 41 U/L   ALT 20 0 - 44 U/L   Alkaline Phosphatase 85 38 - 126 U/L   Total Bilirubin 1.3 (H) 0.3 - 1.2 mg/dL   GFR, Estimated >60 >60 mL/min   Anion gap 14 5 - 15  CBC     Status: Abnormal   Collection Time: 02/28/22  6:57 PM  Result Value Ref Range   WBC 9.8 4.0 - 10.5 K/uL   RBC 5.53 (H) 3.87 - 5.11 MIL/uL   Hemoglobin 16.6 (H) 12.0 - 15.0 g/dL   HCT 47.2 (H) 36.0 - 46.0 %   MCV 85.4 80.0 - 100.0 fL   MCH 30.0 26.0 - 34.0 pg   MCHC 35.2 30.0 - 36.0 g/dL   RDW 12.4 11.5 - 15.5 %   Platelets 398 150 - 400 K/uL   nRBC 0.0 0.0 - 0.2 %  Urinalysis, Routine w reflex microscopic     Status: Abnormal   Collection Time: 02/28/22  6:57 PM  Result Value Ref Range   Color, Urine YELLOW (A) YELLOW   APPearance HAZY (A) CLEAR   Specific Gravity, Urine 1.011 1.005 - 1.030   pH 6.0 5.0 - 8.0   Glucose, UA NEGATIVE NEGATIVE mg/dL  Hgb urine dipstick MODERATE (A) NEGATIVE   Bilirubin Urine NEGATIVE NEGATIVE   Ketones, ur 20 (A) NEGATIVE mg/dL   Protein, ur 100 (A) NEGATIVE mg/dL   Nitrite NEGATIVE NEGATIVE   Leukocytes,Ua NEGATIVE NEGATIVE   RBC / HPF 6-10 0 - 5 RBC/hpf   WBC, UA 21-50 0 - 5 WBC/hpf   Bacteria, UA MANY (A) NONE SEEN   Squamous Epithelial / LPF 6-10 0 - 5   Mucus PRESENT    Hyaline Casts, UA PRESENT    In Ed pt received  Meds ordered this encounter   Medications   sodium chloride 0.9 % bolus 1,000 mL   ondansetron (ZOFRAN) injection 4 mg   pantoprazole (PROTONIX) 80 mg /NS 100 mL IVPB   pantoprozole (PROTONIX) 80 mg /NS 100 mL infusion   pantoprazole (PROTONIX) injection 40 mg   thyroid (ARMOUR) tablet 60 mg   ezetimibe (ZETIA) tablet 10 mg   dipyridamole-aspirin (AGGRENOX) 200-25 MG per 12 hr capsule 1 capsule   fluticasone (FLONASE) 50 MCG/ACT nasal spray 1 spray   ondansetron (ZOFRAN) injection 4 mg   hydrALAZINE (APRESOLINE) injection 5 mg   thyroid (ARMOUR) tablet 90 mg    Take 1 tablet every other day alternating with 90 mg.     sucralfate (CARAFATE) tablet 1 g   heparin injection 5,000 Units   sodium chloride flush (NS) 0.9 % injection 3 mL   OR Linked Order Group    acetaminophen (TYLENOL) tablet 650 mg    acetaminophen (TYLENOL) suppository 650 mg   lactated ringers infusion   nicotine (NICODERM CQ - dosed in mg/24 hours) patch 21 mg    Unresulted Labs (From admission, onward)     Start     Ordered   03/01/22 0500  Comprehensive metabolic panel  Tomorrow morning,   STAT        02/28/22 2212   03/01/22 0500  CBC  Tomorrow morning,   STAT        02/28/22 2212             Admission Imaging : No results found.    Physical Examination: Vitals:   02/28/22 1851 02/28/22 1852  BP: (!) 159/101   Pulse: (!) 102   Temp: 98.2 F (36.8 C)   Resp: 18   Height:  5' (1.524 m)  Weight:  71.7 kg  SpO2: 98%   TempSrc: Oral   BMI (Calculated):  30.86   Physical Exam Vitals and nursing note reviewed.  Constitutional:      General: She is not in acute distress.    Appearance: She is not ill-appearing, toxic-appearing or diaphoretic.  HENT:     Head: Normocephalic and atraumatic.     Right Ear: Hearing and external ear normal.     Left Ear: Hearing and external ear normal.     Nose: Nose normal. No nasal deformity.     Mouth/Throat:     Lips: Pink.     Mouth: Mucous membranes are moist.     Tongue: No  lesions.  Eyes:     Extraocular Movements: Extraocular movements intact.     Pupils: Pupils are equal, round, and reactive to light.  Cardiovascular:     Rate and Rhythm: Normal rate and regular rhythm.     Pulses: Normal pulses.     Heart sounds: Normal heart sounds.  Pulmonary:     Effort: Pulmonary effort is normal.     Breath sounds: Normal breath sounds.  Abdominal:  General: Bowel sounds are normal. There is no distension.     Palpations: Abdomen is soft. There is no mass.     Tenderness: There is no abdominal tenderness. There is no guarding.     Hernia: No hernia is present.  Musculoskeletal:     Right lower leg: No edema.     Left lower leg: No edema.  Skin:    General: Skin is warm.  Neurological:     General: No focal deficit present.     Mental Status: She is alert and oriented to person, place, and time.     Cranial Nerves: Cranial nerves 2-12 are intact.     Motor: Motor function is intact.  Psychiatric:        Attention and Perception: Attention normal.        Mood and Affect: Mood normal.        Speech: Speech normal.        Behavior: Behavior normal. Behavior is cooperative.        Cognition and Memory: Cognition normal.        Assessment and Plan: * Intractable vomiting with nausea Patient presenting with intractable nausea and vomiting. Patient has an extensive history of duodenitis peptic ulcer disease GERD and I suspect this is related to that.   PUD (peptic ulcer disease) Last egd in 2017 showed: - Esophagogastric landmarks were identified: the Z-line was found at 36 cm, the gastroesophageal junction was found at 36 cm and the upper extent of the gastric folds was found at 36 cm from the incisors. Findings: - LA Grade C (one or more mucosal breaks continuous between tops of 2 or more mucosal folds, less than 75% circumference) esophagitis was found in thie distal esophagus. There was a very subtle Shatski ring noted in this area - The exam  of the esophagus was otherwise normal. - A guidewire was placed and the scope was withdrawn. Dilation was performed in the entire esophagus with a Savary dilator with no resistance at 17 mm and 18 mm. No mucosal wrents were appreciated after this intervention. Biopsies were taken with a cold forceps in the upper third of the esophagus, in the middle third of the esophagus and in the lower third of the esophagus for histology. - Diffuse mild inflammation characterized by adherent blood, erosions and erythema was found in the entire examined stomach. Biopsies were taken with a cold forceps for Helicobacter pylori testing. - One non-bleeding superficial duodenal ulcer with no stigmata of bleeding was found in the duodenal bulb. The lesion was 4 mm in largest dimension. - Diffuse mildly erythematous mucosa was found in the second portion of the duodenum. Biopsies were taken with a cold forceps for histology. [place] med list reviewed I do not see any NSAIDs except for the Aggrenox. Suspect patient will do better with a trial of Dexilant once discharged as its not in our formulary. We will continue patient on IV PPI infusion. Scheduled Carafate. GI consult.    Coronary atherosclerosis of native coronary artery Per chart review patient did not want statin therapy and is currently only on Zetia.   Tobacco abuse We will counsel patient on refraining from smoking. Nicotine patch.   HTN (hypertension) Vitals:   02/28/22 1851  BP: (!) 159/101  As needed hydralazine. No antihypertensive agents and med rec pending.   Hyponatremia Patient presenting with hyponatremia and hypochloremia consistent with dehydration. We will continue with gentle IV hydration overnight. Clear  liquid diet and advance as tolerated. As needed antiemetic therapy.   Emphysema lung (HCC) Currently stable. Continue as needed albuterol.   Hypothyroidism Continued on home  regimen with Armour Thyroid 60 mg every other day alternating with 90 mg med verification still pending.    DVT prophylaxis:  Heparin   Code Status:  Full code    Family Communication:  Berrong,Stephen (Spouse)  [phone] (Mobile)   Disposition Plan:  Home    Consults called:  GI: Dr.Russo.   Admission status: Observation.   Unit/ Expected LOS: Med surg.   Para Skeans MD [facility]  6 PM- 2 AM. Please contact me via secure Chat 6 PM-2 AM. [phone] ( Pager ) To contact the [facility] Attending or Consulting provider [place] or covering provider during after hours [place], for this patient.   Check the care team in [facility] and look for a) attending/consulting [facility] provider listed and b) the [facility] team listed Log into [email or web address] and use [place] universal password to access. If you do not have the password, please contact the hospital operator. Locate the [facility] provider you are looking for under [facility] and page to a number that you can be directly reached. If you still have difficulty reaching the provider, please page the [facility] (Director on Call) for the Hospitalists listed on amion for assistance. [email or web address] 02/28/2022, 10:42 PM

## 2022-02-28 NOTE — Assessment & Plan Note (Signed)
Per chart review patient did not want statin therapy and is currently only on Zetia.

## 2022-02-28 NOTE — Assessment & Plan Note (Signed)
We will counsel patient on refraining from smoking. Nicotine patch.

## 2022-02-28 NOTE — Assessment & Plan Note (Addendum)
Patient presenting with intractable nausea and vomiting. Patient has an extensive history of duodenitis peptic ulcer disease GERD and I suspect this is related to that.

## 2022-02-28 NOTE — Assessment & Plan Note (Signed)
Patient presenting with hyponatremia and hypochloremia consistent with dehydration. We will continue with gentle IV hydration overnight. Clear liquid diet and advance as tolerated. As needed antiemetic therapy.

## 2022-02-28 NOTE — Assessment & Plan Note (Signed)
Vitals:   02/28/22 1851  BP: (!) 159/101  As needed hydralazine. No antihypertensive agents and med rec pending.

## 2022-02-28 NOTE — Assessment & Plan Note (Addendum)
Last egd in 2017 showed: - Esophagogastric landmarks were identified: the Z-line was found at 36 cm, the gastroesophageal junction was found at 36 cm and the upper extent of the gastric folds was found at 36 cm from the incisors. Findings: - LA Grade C (one or more mucosal breaks continuous between tops of 2 or more mucosal folds, less than 75% circumference) esophagitis was found in thie distal esophagus. There was a very subtle Shatski ring noted in this area - The exam of the esophagus was otherwise normal. - A guidewire was placed and the scope was withdrawn. Dilation was performed in the entire esophagus with a Savary dilator with no resistance at 17 mm and 18 mm. No mucosal wrents were appreciated after this intervention. Biopsies were taken with a cold forceps in the upper third of the esophagus, in the middle third of the esophagus and in the lower third of the esophagus for histology. - Diffuse mild inflammation characterized by adherent blood, erosions and erythema was found in the entire examined stomach. Biopsies were taken with a cold forceps for Helicobacter pylori testing. - One non-bleeding superficial duodenal ulcer with no stigmata of bleeding was found in the duodenal bulb. The lesion was 4 mm in largest dimension. - Diffuse mildly erythematous mucosa was found in the second portion of the duodenum. Biopsies were taken with a cold forceps for histology. [place] med list reviewed I do not see any NSAIDs except for the Aggrenox. Suspect patient will do better with a trial of Dexilant once discharged as its not in our formulary. We will continue patient on IV PPI infusion. Scheduled Carafate. GI consult.

## 2022-03-01 DIAGNOSIS — E876 Hypokalemia: Secondary | ICD-10-CM | POA: Diagnosis not present

## 2022-03-01 DIAGNOSIS — K222 Esophageal obstruction: Secondary | ICD-10-CM | POA: Diagnosis present

## 2022-03-01 DIAGNOSIS — K269 Duodenal ulcer, unspecified as acute or chronic, without hemorrhage or perforation: Secondary | ICD-10-CM | POA: Diagnosis not present

## 2022-03-01 DIAGNOSIS — K2990 Gastroduodenitis, unspecified, without bleeding: Secondary | ICD-10-CM | POA: Diagnosis not present

## 2022-03-01 DIAGNOSIS — K2100 Gastro-esophageal reflux disease with esophagitis, without bleeding: Secondary | ICD-10-CM | POA: Diagnosis present

## 2022-03-01 DIAGNOSIS — Z79899 Other long term (current) drug therapy: Secondary | ICD-10-CM | POA: Diagnosis not present

## 2022-03-01 DIAGNOSIS — I10 Essential (primary) hypertension: Secondary | ICD-10-CM | POA: Diagnosis present

## 2022-03-01 DIAGNOSIS — Z808 Family history of malignant neoplasm of other organs or systems: Secondary | ICD-10-CM | POA: Diagnosis not present

## 2022-03-01 DIAGNOSIS — Z8673 Personal history of transient ischemic attack (TIA), and cerebral infarction without residual deficits: Secondary | ICD-10-CM | POA: Diagnosis not present

## 2022-03-01 DIAGNOSIS — Z8249 Family history of ischemic heart disease and other diseases of the circulatory system: Secondary | ICD-10-CM | POA: Diagnosis not present

## 2022-03-01 DIAGNOSIS — F17210 Nicotine dependence, cigarettes, uncomplicated: Secondary | ICD-10-CM | POA: Diagnosis present

## 2022-03-01 DIAGNOSIS — R112 Nausea with vomiting, unspecified: Secondary | ICD-10-CM

## 2022-03-01 DIAGNOSIS — R1115 Cyclical vomiting syndrome unrelated to migraine: Secondary | ICD-10-CM | POA: Diagnosis present

## 2022-03-01 DIAGNOSIS — F32A Depression, unspecified: Secondary | ICD-10-CM | POA: Diagnosis present

## 2022-03-01 DIAGNOSIS — Z801 Family history of malignant neoplasm of trachea, bronchus and lung: Secondary | ICD-10-CM | POA: Diagnosis not present

## 2022-03-01 DIAGNOSIS — Z79890 Hormone replacement therapy (postmenopausal): Secondary | ICD-10-CM | POA: Diagnosis not present

## 2022-03-01 DIAGNOSIS — Z1152 Encounter for screening for COVID-19: Secondary | ICD-10-CM | POA: Diagnosis not present

## 2022-03-01 DIAGNOSIS — K267 Chronic duodenal ulcer without hemorrhage or perforation: Secondary | ICD-10-CM | POA: Diagnosis present

## 2022-03-01 DIAGNOSIS — J439 Emphysema, unspecified: Secondary | ICD-10-CM | POA: Diagnosis present

## 2022-03-01 DIAGNOSIS — K580 Irritable bowel syndrome with diarrhea: Secondary | ICD-10-CM | POA: Diagnosis present

## 2022-03-01 DIAGNOSIS — K2980 Duodenitis without bleeding: Secondary | ICD-10-CM | POA: Diagnosis not present

## 2022-03-01 DIAGNOSIS — T7840XA Allergy, unspecified, initial encounter: Secondary | ICD-10-CM | POA: Diagnosis not present

## 2022-03-01 DIAGNOSIS — F419 Anxiety disorder, unspecified: Secondary | ICD-10-CM | POA: Diagnosis present

## 2022-03-01 DIAGNOSIS — K209 Esophagitis, unspecified without bleeding: Secondary | ICD-10-CM | POA: Diagnosis not present

## 2022-03-01 DIAGNOSIS — I2510 Atherosclerotic heart disease of native coronary artery without angina pectoris: Secondary | ICD-10-CM | POA: Diagnosis present

## 2022-03-01 DIAGNOSIS — K2960 Other gastritis without bleeding: Secondary | ICD-10-CM | POA: Diagnosis not present

## 2022-03-01 DIAGNOSIS — Z800 Family history of malignant neoplasm of digestive organs: Secondary | ICD-10-CM | POA: Diagnosis not present

## 2022-03-01 DIAGNOSIS — E871 Hypo-osmolality and hyponatremia: Secondary | ICD-10-CM | POA: Diagnosis present

## 2022-03-01 DIAGNOSIS — E039 Hypothyroidism, unspecified: Secondary | ICD-10-CM | POA: Diagnosis present

## 2022-03-01 DIAGNOSIS — E860 Dehydration: Secondary | ICD-10-CM | POA: Diagnosis present

## 2022-03-01 DIAGNOSIS — K2970 Gastritis, unspecified, without bleeding: Secondary | ICD-10-CM | POA: Diagnosis present

## 2022-03-01 HISTORY — DX: Nausea with vomiting, unspecified: R11.2

## 2022-03-01 LAB — COMPREHENSIVE METABOLIC PANEL
ALT: 15 U/L (ref 0–44)
AST: 15 U/L (ref 15–41)
Albumin: 3.4 g/dL — ABNORMAL LOW (ref 3.5–5.0)
Alkaline Phosphatase: 66 U/L (ref 38–126)
Anion gap: 6 (ref 5–15)
BUN: 13 mg/dL (ref 8–23)
CO2: 25 mmol/L (ref 22–32)
Calcium: 8.1 mg/dL — ABNORMAL LOW (ref 8.9–10.3)
Chloride: 98 mmol/L (ref 98–111)
Creatinine, Ser: 0.55 mg/dL (ref 0.44–1.00)
GFR, Estimated: >60 mL/min (ref >60)
Glucose, Bld: 112 mg/dL — ABNORMAL HIGH (ref 70–99)
Potassium: 2.6 mmol/L — CL (ref 3.5–5.1)
Sodium: 129 mmol/L — ABNORMAL LOW (ref 135–145)
Total Bilirubin: 1.1 mg/dL (ref 0.3–1.2)
Total Protein: 6.3 g/dL — ABNORMAL LOW (ref 6.5–8.1)

## 2022-03-01 LAB — POTASSIUM
Potassium: 3.5 mmol/L (ref 3.5–5.1)
Potassium: 3.4 mmol/L — ABNORMAL LOW (ref 3.5–5.1)

## 2022-03-01 LAB — CBC
HCT: 40 % (ref 36.0–46.0)
Hemoglobin: 13.9 g/dL (ref 12.0–15.0)
MCH: 30 pg (ref 26.0–34.0)
MCHC: 34.8 g/dL (ref 30.0–36.0)
MCV: 86.2 fL (ref 80.0–100.0)
Platelets: 338 10*3/uL (ref 150–400)
RBC: 4.64 MIL/uL (ref 3.87–5.11)
RDW: 12.7 % (ref 11.5–15.5)
WBC: 11.1 10*3/uL — ABNORMAL HIGH (ref 4.0–10.5)
nRBC: 0 % (ref 0.0–0.2)

## 2022-03-01 MED ORDER — POTASSIUM CHLORIDE 20 MEQ PO PACK
40.0000 meq | PACK | Freq: Once | ORAL | Status: AC
  Administered 2022-03-01: 40 meq via ORAL
  Filled 2022-03-01: qty 2

## 2022-03-01 MED ORDER — SUCRALFATE 1 GM/10ML PO SUSP
1.0000 g | Freq: Three times a day (TID) | ORAL | Status: DC
  Administered 2022-03-01 – 2022-03-02 (×3): 1 g via ORAL
  Filled 2022-03-01 (×3): qty 10

## 2022-03-01 MED ORDER — PHENOL 1.4 % MT LIQD
1.0000 | OROMUCOSAL | Status: DC | PRN
  Administered 2022-03-01: 1 via OROMUCOSAL
  Filled 2022-03-01: qty 177

## 2022-03-01 MED ORDER — POTASSIUM CHLORIDE 10 MEQ/100ML IV SOLN
10.0000 meq | INTRAVENOUS | Status: AC
  Administered 2022-03-01 – 2022-03-02 (×4): 10 meq via INTRAVENOUS
  Filled 2022-03-01 (×4): qty 100

## 2022-03-01 MED ORDER — POTASSIUM CHLORIDE CRYS ER 20 MEQ PO TBCR: 40.0000 meq | EXTENDED_RELEASE_TABLET | ORAL | Status: DC

## 2022-03-01 MED ORDER — POTASSIUM CHLORIDE 10 MEQ/100ML IV SOLN
10.0000 meq | INTRAVENOUS | Status: AC
  Administered 2022-03-01 (×2): 10 meq via INTRAVENOUS
  Filled 2022-03-01: qty 100

## 2022-03-01 MED ORDER — POTASSIUM CHLORIDE 20 MEQ PO PACK: 40.0000 meq | PACK | Freq: Once | ORAL | Status: DC

## 2022-03-01 MED ORDER — POTASSIUM CHLORIDE 10 MEQ/100ML IV SOLN
10.0000 meq | INTRAVENOUS | Status: DC
  Filled 2022-03-01: qty 100

## 2022-03-01 MED ORDER — METOCLOPRAMIDE HCL 5 MG/ML IJ SOLN
10.0000 mg | Freq: Once | INTRAMUSCULAR | Status: AC
  Administered 2022-03-01: 10 mg via INTRAVENOUS
  Filled 2022-03-01: qty 2

## 2022-03-01 MED ORDER — THYROID 60 MG PO TABS
90.0000 mg | ORAL_TABLET | ORAL | Status: DC
  Administered 2022-03-02: 90 mg via ORAL
  Filled 2022-03-01: qty 1

## 2022-03-01 MED ORDER — SODIUM CHLORIDE 0.9 % IV SOLN: INTRAVENOUS | Status: DC

## 2022-03-01 NOTE — Progress Notes (Addendum)
Patient arrived to unit A&Ox4. No distress noted. Patient reports continued nauseated feeling. Barf bag provided. Patient was oriented to staff, unit, and equipment. Safety precautions in place. Patient report that she took her own home dose of aggrenox prior to arrive to the unit. Patient was informed that it is not safe practice to consume home medication while hospitalized due to risk of interactions with medication that may be prescribed while in the hospital, or the risk of overdosing if she were to receive the same medication from staff. Patient verbalized understanding and stated she sent the medications home with her daughter. Patient c/o abdominal pain 9/10 and SBP was 195. PRN blood pressure medication and pain medication was administered and noted effective. Call light within reach. Plan of care reviewed with patient and she agree. IVF initiated. IV protonix ongoing. Will continue to monitor and endorse.   Also, the patient was noted wiping from back to front after voiding and educated to wipe from front to back to prevent an infection. Patient verbalized and demonstrated the correct way to wipe.

## 2022-03-01 NOTE — Plan of Care (Signed)

## 2022-03-01 NOTE — Progress Notes (Signed)
       CROSS COVER NOTE  NAME: Tiffany Orozco MRN: [id number] DOB : [birth year]    Date of Service   03/01/2022   HPI/Events of Note   Message received from nursing reporting episode of melena, not witnessed by nursing. Per H&P patient has a history of melena. HGB 13.9 this morning down from 16.6 yesterday. BP 163/66 HR 83. No reports of dizziness, lightheadedness, palpitations.  Interventions   Assessment/Plan:  Continue PPI GI consulted by admitting physician Will continue Aggrenox for CVA risk stratification for now      This document was prepared using Dragon voice recognition software and may include unintentional dictation errors.  [name] DNP, MHA, FNP-BC Nurse Practitioner [facility] Pager [phone]

## 2022-03-01 NOTE — Progress Notes (Signed)
Patient reports she had a small BM that was loose, black, and tarry. Patient was informed to let nursing know when she has another bowel movement and a hat is not placed in her toilet.

## 2022-03-01 NOTE — Progress Notes (Addendum)
PHARMACY CONSULT NOTE - FOLLOW UP  Pharmacy Consult for Electrolyte Monitoring and Replacement   Recent Labs: Potassium (mmol/L)  Date Value  03/01/2022 3.4 (L)   Magnesium (mg/dL)  Date Value  12/22/2021 2.0   Calcium (mg/dL)  Date Value  03/01/2022 8.1 (L)   Albumin (g/dL)  Date Value  03/01/2022 3.4 (L)   Phosphorus (mg/dL)  Date Value  06/12/2015 3.0   Sodium (mmol/L)  Date Value  03/01/2022 129 (L)     Assessment: 71 year old female admitted with n/v, melena and concern for GIB.  Past medical history includes IBS, HTN, heart murmur.   Renal function stable and c/w baseline.   Goal of Therapy:  K > 4 Mag > 2  Plan:  Kcl 10 mEq x 4. IV Follow up labs tomorrow AM  Eleonore Chiquito, PharmD Clinical Pharmacist  03/01/2022 6:59 PM

## 2022-03-01 NOTE — Progress Notes (Signed)
Tiffany Orozco at [place] NAME: Kevin Mario    MR#:  [id number]  DATE OF BIRTH:  [birth year]  SUBJECTIVE:  patient has been thought to have been the emergency room the last few days with nausea vomiting unable to keep anything orally. She was found to have low potassium. Unable to tolerated PO. No family at bedside when seen earlier. G.I. planning to do endoscopy which has been postponed till tomorrow. Denies any diarrhea.    VITALS:  Blood pressure (!) 149/66, pulse 81, temperature 98.3 F (36.8 C), resp. rate 18, height 5' (1.524 m), weight 74.2 kg, SpO2 95 %.  PHYSICAL EXAMINATION:   GENERAL:  71 y.o.-year-old patient lying in the bed with no acute distress. Dry oral mucosa. LUNGS: Normal breath sounds bilaterally, no wheezing, rales, rhonchi.  CARDIOVASCULAR: S1, S2 normal. No murmurs, rubs, or gallops.  ABDOMEN: Soft, nontender, nondistended. Bowel sounds present.  EXTREMITIES: No  edema b/l.    NEUROLOGIC: nonfocal  patient is alert and awake SKIN: No obvious rash, lesion, or ulcer.   LABORATORY PANEL:  CBC Recent Labs  Lab 03/01/22 0512  WBC 11.1*  HGB 13.9  HCT 40.0  PLT 338    Chemistries  Recent Labs  Lab 03/01/22 0512 03/01/22 1434  NA 129*  --   K 2.6* 3.5  CL 98  --   CO2 25  --   GLUCOSE 112*  --   BUN 13  --   CREATININE 0.55  --   CALCIUM 8.1*  --   AST 15  --   ALT 15  --   ALKPHOS 66  --   BILITOT 1.1  --    Assessment and Plan Tiffany Orozco is a 71 y.o. female  with medical history of chronic hyponatremia, prior stroke, COPD, hypothyroid, presents with nausea, vomiting, diarrhea in the setting of subjective fever and cough for the last 2 days.  Intractable nausea vomiting with history of peptic ulcer disease/Gerd -- patient had EGD in 2017 which showed diffuse gastritis and duodenal ulcer. -- Started on IV Protonix drip -- G.I. consultation with Dr. Virgina Jock-- plan is for endoscopy once potassium  is replaced -- clear liquid diet -- PRN Zofran  Hypokalemia suspected due to G.I. loss -- IV potassium repletion per pharmacy  History of COPD -- appears stable. PRN nebs inhaler  Hypothyroidism -- continue thyroid supplement   Procedures: Family communication : none today Consults : G.I. Dr. Virgina Jock CODE STATUS: full DVT Prophylaxis : SCD Level of care: Med-Surg Status is: Inpatient Remains inpatient appropriate because: patient needs potassium replacement, EGD    TOTAL TIME TAKING CARE OF THIS PATIENT: 35 minutes.  >50% time spent on counselling and coordination of care  Note: This dictation was prepared with Dragon dictation along with smaller phrase technology. Any transcriptional errors that result from this process are unintentional.  Fritzi Mandes M.D    [facility]   CC: Primary care physician; Jinny Sanders, MD

## 2022-03-01 NOTE — Progress Notes (Signed)
Date and time results received: 03/01/22 0730  (use smartphrase ".now" to insert current time)  Test: Potassium Critical Value: 2.6  Name of Provider Notified: Dr. Posey Pronto  Orders Received? Or Actions Taken?:  Awaiting response or orders. Oncoming RN made ware.

## 2022-03-01 NOTE — Progress Notes (Signed)
PHARMACY CONSULT NOTE - FOLLOW UP  Pharmacy Consult for Electrolyte Monitoring and Replacement   Recent Labs: Potassium (mmol/L)  Date Value  03/01/2022 2.6 (LL)   Magnesium (mg/dL)  Date Value  12/22/2021 2.0   Calcium (mg/dL)  Date Value  03/01/2022 8.1 (L)   Albumin (g/dL)  Date Value  03/01/2022 3.4 (L)   Phosphorus (mg/dL)  Date Value  06/12/2015 3.0   Sodium (mmol/L)  Date Value  03/01/2022 129 (L)     Assessment: 71 year old female admitted with n/v, melena and concern for GIB.  Past medical history includes IBS, HTN, heart murmur.   Renal function stable and c/w baseline.   Goal of Therapy:  K > 4 Mag > 2  Plan:  Given Kcl IV 10 mEq x 2 runs.  Follow up K+ '@1800'$  Follow up labs tomorrow AM   Glean Salvo, PharmD Clinical Pharmacist  03/01/2022 7:32 AM

## 2022-03-01 NOTE — Progress Notes (Signed)
   03/01/22 1000  Clinical Encounter Type  Visited With Patient  Visit Type Initial  Referral From Nurse  Consult/Referral To Chaplain   Chaplain responded to nurse consult. Chaplain provided a compassionate presence and reflective listening as patient spoke about her frustration with her health challenges. Chaplain provided support so that patient is heard. Chaplain also provided paperwork for AD. Patient said that she would read through packet and would have Chaplain paged if she is interested in completing them. Chaplain is available for follow up as needed.

## 2022-03-01 NOTE — Consult Note (Signed)
GI Inpatient Consult Note  Reason for Consult: nausea/vomiting/abd pain   Attending Requesting Consult: Dr. Posey Pronto  History of Present Illness: Tiffany Orozco is a 71 y.o. female seen for evaluation of nausea/vomiting at the request of Dr. Posey Pronto. PMHx of chronic hyponatremia, stroke, COPD, hypothyroid, GERD. Seen in ED for N/V on 12/22/21, 02/26/22, 02/27/22, and 02/28/22. Work up including CT a/p was fairly unremarkable and discharged home 9/29 and 9/30 but acutely worsened and admitted on 02/28/22. Admission labs showed noraml lipase, chronic hyponatremia slightly below baseline (Na 129), K+ 3.2, CL 91, T bili 1.3. Normal WBC.  Patient reports she has had intermittent episodes of nausea and vomiting every few months over the past few years.  Most recent episode prior to current episode was in July 2023 when she was seen in the emergency room for questionable duodenitis and started on PPI and Carafate.  She was doing fairly well until acutely 5 nights ago when she developed vomiting.  She also developed significant abdominal pain and odynophagia with the persistent nausea/vomiting. She was on Protonix 40 mg once a day and Carafate 4 times a day when the symptoms began.  She currently describes painful swallowing and feels that pills, solids, and liquids will stick in mid esophagus and regurgitate up.  She has been vomiting several times a day over the past 4 to 5 days, she states she last vomited yesterday before admission.  She does continue to have the esophageal pain from vomiting as well as abdominal pain that begins in epigastric area and radiates to lower abdomen.  Described as a burning and gnawing pain. Patient reports to me last bowel movement was at symptom onset 4 days ago given she is not currently having much oral intake. Nursing reports indicate episode of melena this morning.  She denies any melena or rectal bleeding at home prior to episode. Lives at home w/ her husband.     She smokes 8  to 10 cigarettes/day.  She denies alcohol.  NSAIDs approximately once a week.   Last Colonoscopy: 2011 Last Endoscopy: 2017-results below   Past Medical History:  Past Medical History:  Diagnosis Date   Allergy    Anxiety    COPD (chronic obstructive pulmonary disease) ([place])    DDD (degenerative disc disease), lumbar    mild   Depression    Female rectocele without uterine prolapse    GERD (gastroesophageal reflux disease)    Hearing loss    Heart murmur    Hyperlipidemia    Hypertension    Hypothyroidism    IBS (irritable bowel syndrome)    Influenza A 06/2015   Obesity    Osteopenia    Plantar fasciitis    Pneumonia    Skin cancer of arm    Skin cancer of face    Stroke ([place]) 07/2010   no permenant deficits   Thrombocytopenia ([place])    Tubal pregnancy     Problem List: Patient Active Problem List   Diagnosis Date Noted   Epigastric pain 01/22/2022   Vertigo 01/22/2022   Aortic atherosclerosis ([place]) 02/16/2021   COVID-19 01/20/2021   Intractable vomiting with nausea 01/08/2021   Right facial swelling 11/27/2020   Status post repair of ventral hernia 96/78/9381   Umbilical hernia without obstruction and without gangrene    Acoustic trauma (explosive) to ear, bilateral 04/03/2019   Esophagitis 09/02/2016   PUD (peptic ulcer disease) 09/02/2016   Coronary atherosclerosis of native coronary artery 05/04/2016   Tobacco abuse  06/09/2015   History of CVA (cerebrovascular accident) 06/08/2015   IBS (irritable bowel syndrome) 06/08/2015   Nausea 01/17/2015   Chronic back pain 07/12/2014   Dysphagia 04/19/2014   Dental caries 10/03/2012   Fatigue 09/12/2012   HTN (hypertension) 11/30/2011   Herniation of rectum into vagina 08/29/2011   Hyponatremia 03/26/2011   Dyspnea 03/23/2011   CONSTIPATION, CHRONIC 03/19/2010   Osteopenia 03/19/2010   Eustachian tube dysfunction 10/21/2009   UNSPECIFIED CARDIAC DYSRHYTHMIA 03/06/2009   Allergic rhinitis 08/14/2007    Emphysema lung ([place]) 08/14/2007   Hypothyroidism 02/27/2007   HYPERCHOLESTEROLEMIA 02/27/2007   Generalized anxiety disorder 02/27/2007   Depression, major, single episode, moderate ([place]) 02/27/2007    Past Surgical History: Past Surgical History:  Procedure Laterality Date   APPENDECTOMY     CHOLECYSTECTOMY  2004   COLONOSCOPY     CYST EXCISION Right    breast mole   EMGs  7/04   atms and wrists neg   ESOPHAGEAL MANOMETRY N/A 03/17/2015   Procedure: ESOPHAGEAL MANOMETRY (EM);  Surgeon: Manus Gunning, MD;  Location: [facility];  Service: Gastroenterology;  Laterality: N/A;   FOOT SURGERY Right 2008, 2009, 2010   torn ligament, hammertoes   TUBAL LIGATION  1975   UPPER GI ENDOSCOPY     XI ROBOTIC ASSISTED VENTRAL HERNIA N/A 04/14/2020   Procedure: XI ROBOTIC ASSISTED VENTRAL HERNIA;  Surgeon: Fredirick Maudlin, MD;  Location: [facility] ORS;  Service: General;  Laterality: N/A;    Allergies: Allergies  Allergen Reactions   Lisinopril Shortness Of Breath   Niacin Other (See Comments)     dizziness   Welchol [Colesevelam Hcl] Diarrhea, Nausea Only, Swelling and Other (See Comments)     swelling in face   Augmentin [Amoxicillin-Pot Clavulanate] Nausea Only    GI upset with taking.   Codeine     keeps patient awake   Oxycodone Hcl     Patient states this medication makes her hyper    Home Medications: Medications Prior to Admission  Medication Sig Dispense Refill Last Dose   ARMOUR THYROID 90 MG tablet Take 1 tablet every other day alternating with 60 mg. 45 tablet 3    Cholecalciferol (VITAMIN D3) 125 MCG (5000 UT) TABS Take 5,000 Units by mouth daily.      dipyridamole-aspirin (AGGRENOX) 200-25 MG 12hr capsule TAKE 1 CAPSULE BY MOUTH TWICE A DAY 180 capsule 3    ezetimibe (ZETIA) 10 MG tablet Take 1 tablet (10 mg total) by mouth daily. 90 tablet 3    FLAREX 0.1 % ophthalmic suspension Apply to eye.      fluticasone (FLONASE) 50 MCG/ACT nasal spray SPRAY 2 SPRAYS INTO  EACH NOSTRIL EVERY DAY 48 mL 1    ketoconazole (NIZORAL) 2 % shampoo Apply 1 application. topically 2 (two) times a week. 120 mL 0    ondansetron (ZOFRAN-ODT) 4 MG disintegrating tablet Take 1 tablet (4 mg total) by mouth every 8 (eight) hours as needed for nausea or vomiting. 12 tablet 0    pantoprazole (PROTONIX) 40 MG tablet Take 1 tablet (40 mg total) by mouth daily. 90 tablet 1    sucralfate (CARAFATE) 1 g tablet Take 1 tablet (1 g total) by mouth 3 (three) times daily as needed. 90 tablet 0    thyroid (ARMOUR THYROID) 60 MG tablet Take 1 tablet every other day alternating with 90 mg. 45 tablet 3    triamcinolone cream (KENALOG) 0.1 % SMARTSIG:sparingly Topical Twice Daily PRN      Home  medication reconciliation was completed with the patient.   Scheduled Inpatient Medications:    dipyridamole-aspirin  1 capsule Oral BID   ezetimibe  10 mg Oral Daily   fluticasone  1 spray Each Nare Daily   heparin  5,000 Units Subcutaneous Q8H   nicotine  21 mg Transdermal Daily   [START ON 03/04/2022] pantoprazole  40 mg Intravenous Q12H   sodium chloride flush  3 mL Intravenous Q12H   sucralfate  1 g Oral TID WC & HS   thyroid  60 mg Oral QODAY   [START ON 03/02/2022] thyroid  90 mg Oral QODAY    Continuous Inpatient Infusions:    lactated ringers 50 mL/hr at 03/01/22 0022   pantoprazole 8 mg/hr (03/01/22 0012)    PRN Inpatient Medications:  acetaminophen **OR** acetaminophen, fentaNYL (SUBLIMAZE) injection, hydrALAZINE, ondansetron (ZOFRAN) IV, phenol  Family History: family history includes Colon cancer in her sister; Esophageal cancer in her sister; Hypertension in her mother; Lung cancer in her brother; Ovarian cancer in her sister; Skin cancer in her father.    Social History:   reports that she has been smoking cigarettes. She has a 23.25 pack-year smoking history. She has never used smokeless tobacco. She reports that she does not drink alcohol and does not use drugs.   Review of  Systems: Constitutional: Weight is stable.  Eyes: No changes in vision. ENT: No oral lesions, sore throat.  GI: see HPI.  Heme/Lymph: No easy bruising.  CV: No chest pain.  GU: No hematuria.  Integumentary: No rashes.  Neuro: No headaches.  Psych: No depression/anxiety.  Endocrine: No heat/cold intolerance.  Allergic/Immunologic: No urticaria.  Resp: No cough, SOB.  Musculoskeletal: No joint swelling.    Physical Examination: BP (!) 163/66 (BP Location: Right Arm)   Pulse 83   Temp 97.8 F (36.6 C) (Oral)   Resp 18   Ht 5' (1.524 m)   Wt 74.2 kg   SpO2 96%   BMI 31.95 kg/m  Gen: NAD, alert and oriented x 4 HEENT: PEERLA, EOMI, Neck: supple, no JVD or thyromegaly Chest: CTA bilaterally, no wheezes, crackles, or other adventitious sounds CV: RRR, no m/g/c/r Abd: soft, minimal TTP diffusely, ND, +BS in all four quadrants; no HSM, guarding, ridigity, or rebound tenderness Ext: no edema, well perfused with 2+ pulses, Skin: no rash or lesions noted Lymph: no LAD  Data: Lab Results  Component Value Date   WBC 11.1 (H) 03/01/2022   HGB 13.9 03/01/2022   HCT 40.0 03/01/2022   MCV 86.2 03/01/2022   PLT 338 03/01/2022   Recent Labs  Lab 02/26/22 2259 02/28/22 1857 03/01/22 0512  HGB 16.0* 16.6* 13.9   Lab Results  Component Value Date   NA 129 (L) 03/01/2022   K 2.6 (LL) 03/01/2022   CL 98 03/01/2022   CO2 25 03/01/2022   BUN 13 03/01/2022   CREATININE 0.55 03/01/2022   Lab Results  Component Value Date   ALT 15 03/01/2022   AST 15 03/01/2022   ALKPHOS 66 03/01/2022   BILITOT 1.1 03/01/2022   No results for input(s): "APTT", "INR", "PTT" in the last 168 hours.  CT a/p 02/26/22-IMPRESSION: No acute findings in the abdomen or pelvis. Aortic atherosclerosis.   CT a/p 11/2021-IMPRESSION: 1. Query mild thickening of the duodenum which is stable potentially related to mild duodenitis or ulcer disease but without surrounding stranding or substantial change  from prior imaging. 2. Post cholecystectomy without biliary duct dilation. 3. Aortic atherosclerosis. 4. Suspect mild  hepatic steatosis.   EGD-07/2014-grade 1 esophagitis, esophageal spasm, dilated 22m, retained esophageal secretions suggestive of dysmotility.   EGD 2017-- - Esophagogastric landmarks identified. - LA Grade C reflux esophagitis. - Gastritis. Biopsied. - One non-bleeding duodenal ulcer with no stigmata of bleeding. - Erythematous duodenopathy. Biopsied. - Dilation performed in the entire esophagus for symptoms of dysphagia - Assessment/Plan: Ms. TCastlemanis a 71y.o. female admitted for nausea, vomiting, and abd pain.    N/V/abd pain - Most likely gastritis, peptic ulcer disease, esophagitis. Needs EGD for further evaluation.  Patient has been on Protonix 40 mg once a day at home as well as Carafate 3-4 times a day.  Continue IV PPI started at admission, n.p.o. prior to EGD later today, Antiemetics PRN. CT a/p was unremarkable for source of pain this admission. Hypokalemia-K+ dropped to 2.6 this morning, will need to be replaced by primary team prior to endoscopic procedures.  Melena - reported episode this AM, Hgb dropped from 16.6 to 13.9, will plan to hold aggrenox prior to procedures. Denies melena at home chronically. Overall Hgb stable. Odynophagia/dysphagia-likely has esophagitis/stricture, EGD and PPI as above. Colon cancer screening - overdue, address as outpatient.  Recommendations:   EGD for further evaluation. Pt denies prior issues w/ sedation Hold aggrenox until EGD Electrolyte replacement per primary team  NPO until EGD Further recs pending EGD results    Case was discussed with Dr. RVirgina Jock Thank you for the consult. Please call with questions or concerns.  JEzzard Standing PA-C [facility]

## 2022-03-01 NOTE — Plan of Care (Signed)
Lab called with a critical value - K+ = 2.6.  Informed pt's Night nurse, Laural Golden.

## 2022-03-02 ENCOUNTER — Inpatient Hospital Stay: Payer: Medicare Other | Admitting: Anesthesiology

## 2022-03-02 ENCOUNTER — Encounter: Payer: Self-pay | Admitting: Internal Medicine

## 2022-03-02 ENCOUNTER — Encounter
Admission: EM | Disposition: A | Discharge status: Home / Self Care | Payer: Self-pay | Source: Home / Self Care | Attending: Internal Medicine

## 2022-03-02 DIAGNOSIS — K222 Esophageal obstruction: Secondary | ICD-10-CM | POA: Diagnosis not present

## 2022-03-02 DIAGNOSIS — I10 Essential (primary) hypertension: Secondary | ICD-10-CM | POA: Diagnosis not present

## 2022-03-02 DIAGNOSIS — T7840XA Allergy, unspecified, initial encounter: Secondary | ICD-10-CM | POA: Diagnosis not present

## 2022-03-02 DIAGNOSIS — R112 Nausea with vomiting, unspecified: Secondary | ICD-10-CM | POA: Diagnosis not present

## 2022-03-02 DIAGNOSIS — K269 Duodenal ulcer, unspecified as acute or chronic, without hemorrhage or perforation: Secondary | ICD-10-CM | POA: Diagnosis not present

## 2022-03-02 HISTORY — PX: ESOPHAGOGASTRODUODENOSCOPY: SHX5428

## 2022-03-02 LAB — BASIC METABOLIC PANEL
Anion gap: 6 (ref 5–15)
BUN: 8 mg/dL (ref 8–23)
CO2: 25 mmol/L (ref 22–32)
Calcium: 8.3 mg/dL — ABNORMAL LOW (ref 8.9–10.3)
Chloride: 100 mmol/L (ref 98–111)
Creatinine, Ser: 0.66 mg/dL (ref 0.44–1.00)
GFR, Estimated: >60 mL/min (ref >60)
Glucose, Bld: 91 mg/dL (ref 70–99)
Potassium: 3.8 mmol/L (ref 3.5–5.1)
Sodium: 131 mmol/L — ABNORMAL LOW (ref 135–145)

## 2022-03-02 LAB — MAGNESIUM: Magnesium: 2.2 mg/dL (ref 1.7–2.4)

## 2022-03-02 SURGERY — EGD (ESOPHAGOGASTRODUODENOSCOPY)
Anesthesia: General

## 2022-03-02 MED ORDER — DEXMEDETOMIDINE HCL IN NACL 200 MCG/50ML IV SOLN
INTRAVENOUS | Status: DC | PRN
  Administered 2022-03-02: 8 ug via INTRAVENOUS

## 2022-03-02 MED ORDER — LIDOCAINE HCL (CARDIAC) PF 100 MG/5ML IV SOSY
PREFILLED_SYRINGE | INTRAVENOUS | Status: DC | PRN
  Administered 2022-03-02: 50 mg via INTRAVENOUS

## 2022-03-02 MED ORDER — NICOTINE 21 MG/24HR TD PT24: 21.0000 mg | MEDICATED_PATCH | Freq: Every day | TRANSDERMAL | 0 refills | Status: DC

## 2022-03-02 MED ORDER — PANTOPRAZOLE SODIUM 40 MG PO TBEC: 40.0000 mg | DELAYED_RELEASE_TABLET | Freq: Two times a day (BID) | ORAL | 3 refills | Status: DC

## 2022-03-02 MED ORDER — SUCRALFATE 1 G PO TABS: 1.0000 g | ORAL_TABLET | Freq: Three times a day (TID) | ORAL | 3 refills | Status: DC | PRN

## 2022-03-02 MED ORDER — PROPOFOL 10 MG/ML IV BOLUS
INTRAVENOUS | Status: DC | PRN
  Administered 2022-03-02: 20 mg via INTRAVENOUS
  Administered 2022-03-02 (×2): 50 mg via INTRAVENOUS
  Administered 2022-03-02: 20 mg via INTRAVENOUS

## 2022-03-02 NOTE — Op Note (Signed)
[facility] Patient Name: Tiffany Orozco Procedure Date: 03/02/2022 10:46 AM MRN: [id number] Account #: [id number] Date of Birth: 18-Feb-1951 Admit Type: Inpatient Age: 71 Room: [facility] ENDO ROOM 4 Gender: Female Note Status: Finalized Instrument Name: Upper Endoscope [id number] Procedure:             Upper GI endoscopy Indications:           Persistent vomiting Providers:             Annamaria Helling DO, DO Referring MD:          Jinny Sanders MD, MD (Referring MD) Medicines:             Monitored Anesthesia Care Complications:         No immediate complications. Estimated blood loss:                         Minimal. Procedure:             Pre-Anesthesia Assessment:                        - Prior to the procedure, a History and Physical was                         performed, and patient medications and allergies were                         reviewed. The patient is competent. The risks and                         benefits of the procedure and the sedation options and                         risks were discussed with the patient. All questions                         were answered and informed consent was obtained.                         Patient identification and proposed procedure were                         verified by the physician, the nurse, the anesthetist                         and the technician in the endoscopy suite. Mental                         Status Examination: alert and oriented. Airway                         Examination: normal oropharyngeal airway and neck                         mobility. Respiratory Examination: clear to                         auscultation. CV Examination: RRR, no murmurs, no S3  or S4. Prophylactic Antibiotics: The patient does not                         require prophylactic antibiotics. Prior                         Anticoagulants: The patient has taken antiplatelet                          medication, last dose was 2 days prior to procedure.                         ASA Grade Assessment: III - A patient with severe                         systemic disease. After reviewing the risks and                         benefits, the patient was deemed in satisfactory                         condition to undergo the procedure. The anesthesia                         plan was to use monitored anesthesia care (MAC).                         Immediately prior to administration of medications,                         the patient was re-assessed for adequacy to receive                         sedatives. The heart rate, respiratory rate, oxygen                         saturations, blood pressure, adequacy of pulmonary                         ventilation, and response to care were monitored                         throughout the procedure. The physical status of the                         patient was re-assessed after the procedure.                        After obtaining informed consent, the endoscope was                         passed under direct vision. Throughout the procedure,                         the patient's blood pressure, pulse, and oxygen                         saturations were monitored continuously. The  Endosonoscope was introduced through the mouth, and                         advanced to the second part of duodenum. The upper GI                         endoscopy was accomplished without difficulty. The                         patient tolerated the procedure well. Findings:      One non-bleeding cratered duodenal ulcer with a clean ulcer base       (Forrest Class III) was found in the second portion of the duodenum. The       lesion was 6 mm in largest dimension. Biopsies were taken with a cold       forceps for histology. Estimated blood loss was minimal.      Localized mild inflammation characterized by erythema was found in the        gastric antrum. Biopsies were taken with a cold forceps for Helicobacter       pylori testing. Estimated blood loss was minimal.      The exam of the stomach was otherwise normal.      LA Grade D (one or more mucosal breaks involving at least 75% of       esophageal circumference) esophagitis with no bleeding was found.       Biopsies were taken with a cold forceps for histology. Estimated blood       loss was minimal.      A widely patent Schatzki ring was found at the gastroesophageal junction.      Esophagogastric landmarks were identified: the gastroesophageal junction       was found at 35 cm from the incisors.      The exam of the duodenum was otherwise normal. Impression:            - Non-bleeding duodenal ulcer with a clean ulcer base                         (Forrest Class III). Biopsied.                        - Gastritis. Biopsied.                        - LA Grade D reflux esophagitis with no bleeding.                         Biopsied.                        - Widely patent Schatzki ring.                        - Esophagogastric landmarks identified. Recommendation:        - Return patient to hospital ward for ongoing care.                        - Soft diet.                        - Continue present medications.                        -  No aspirin, ibuprofen, naproxen, or other                         non-steroidal anti-inflammatory drugs.                        - Use Protonix (pantoprazole) 40 mg PO BID.                        - Await pathology results.                        - Repeat upper endoscopy in 8 weeks to evaluate the                         response to therapy.                        - Return to GI office at appointment to be scheduled.                        - The findings and recommendations were discussed with                         the patient. Procedure Code(s):     --- Professional ---                        [phone], Esophagogastroduodenoscopy, flexible,                          transoral; with biopsy, single or multiple Diagnosis Code(s):     --- Professional ---                        K26.9, Duodenal ulcer, unspecified as acute or                         chronic, without hemorrhage or perforation                        K29.70, Gastritis, unspecified, without bleeding                        K21.00, Gastro-esophageal reflux disease with                         esophagitis, without bleeding                        K22.2, Esophageal obstruction                        B26.20, Cyclical vomiting syndrome unrelated to                         migraine CPT copyright 2019 American Medical Association. All rights reserved. The codes documented in this report are preliminary and upon coder review may  be revised to meet current compliance requirements. Attending Participation:      I personally performed the entire procedure. Volney American, DO Annamaria Helling DO, DO 03/02/2022 12:03:05 PM This report has been signed electronically. Number of  Addenda: 0 Note Initiated On: 03/02/2022 10:46 AM Estimated Blood Loss:  Estimated blood loss was minimal.      [facility]

## 2022-03-02 NOTE — Anesthesia Postprocedure Evaluation (Signed)
Anesthesia Post Note  Patient: Tiffany Orozco  Procedure(s) Performed: ESOPHAGOGASTRODUODENOSCOPY (EGD)  Patient location during evaluation: Endoscopy Anesthesia Type: General Level of consciousness: awake and alert Pain management: pain level controlled Vital Signs Assessment: post-procedure vital signs reviewed and stable Respiratory status: spontaneous breathing, nonlabored ventilation, respiratory function stable and patient connected to nasal cannula oxygen Cardiovascular status: blood pressure returned to baseline and stable Postop Assessment: no apparent nausea or vomiting Anesthetic complications: no   No notable events documented.   Last Vitals:  Vitals:   03/02/22 1156 03/02/22 1244  BP: (!) 150/62 (!) 157/59  Pulse: (!) 57 (!) 56  Resp: 17 20  Temp:  36.7 C  SpO2: 96% 100%    Last Pain:  Vitals:   03/02/22 1156  TempSrc:   PainSc: 0-No pain                 Precious Haws Rael Yo

## 2022-03-02 NOTE — Interval H&P Note (Signed)
History and Physical Interval Note: Preprocedure H&P from 03/02/22  was reviewed and there was no interval change after seeing and examining the patient.  Written consent was obtained from the patient after discussion of risks, benefits, and alternatives. Patient has consented to proceed with Esophagogastroduodenoscopy with possible intervention   03/02/2022 11:10 AM  Tiffany Orozco  has presented today for surgery, with the diagnosis of Nausea/vomiting/dysphagia.  The various methods of treatment have been discussed with the patient and family. After consideration of risks, benefits and other options for treatment, the patient has consented to  Procedure(s): ESOPHAGOGASTRODUODENOSCOPY (EGD) (N/A) as a surgical intervention.  The patient's history has been reviewed, patient examined, no change in status, stable for surgery.  I have reviewed the patient's chart and labs.  Questions were answered to the patient's satisfaction.     Annamaria Helling

## 2022-03-02 NOTE — Discharge Instructions (Signed)
Abstain from smoking

## 2022-03-02 NOTE — Progress Notes (Signed)
Pt being discharged home, discharge instructions reviewed with pt and wife, states understanding, pt with no complaints at discharge 

## 2022-03-02 NOTE — TOC Initial Note (Signed)
Transition of Care [facility]) - Initial/Assessment Note    Patient Details  Name: Tiffany Orozco MRN: [id number] Date of Birth: [birth year]  Transition of Care [facility]) CM/SW Contact:    Laurena Slimmer, RN Phone Number: 03/02/2022, 12:27 PM  Clinical Narrative:                  Transition of Care [facility]) Screening Note   Patient Details  Name: Tiffany Orozco Date of Birth: 02-24-1951   Transition of Care [facility]) CM/SW Contact:    Laurena Slimmer, RN Phone Number: 03/02/2022, 12:28 PM    Transition of Care Department [facility]) has reviewed patient and no TOC needs have been identified at this time. We will continue to monitor patient advancement through interdisciplinary progression rounds. If new patient transition needs arise, please place a TOC consult.          Patient Goals and CMS Choice        Expected Discharge Plan and Services                                                Prior Living Arrangements/Services                       Activities of Daily Living Home Assistive Devices/Equipment: Blood pressure cuff, Eyeglasses, Grab bars around toilet, Grab bars in shower, Hand-held shower hose, Reacher, Walker (specify type) (Rollator) ADL Screening (condition at time of admission) Patient's cognitive ability adequate to safely complete daily activities?: Yes Is the patient deaf or have difficulty hearing?: No Does the patient have difficulty seeing, even when wearing glasses/contacts?: No Does the patient have difficulty concentrating, remembering, or making decisions?: No Patient able to express need for assistance with ADLs?: Yes Does the patient have difficulty dressing or bathing?: No Independently performs ADLs?: Yes (appropriate for developmental age) Does the patient have difficulty walking or climbing stairs?: No Weakness of Legs: Both Weakness of Arms/Hands: None  Permission Sought/Granted                  Emotional Assessment               Admission diagnosis:  Intractable nausea and vomiting [R11.2] Nausea and vomiting, unspecified vomiting type [R11.2] Patient Active Problem List   Diagnosis Date Noted   Intractable nausea and vomiting 03/01/2022   Epigastric pain 01/22/2022   Vertigo 01/22/2022   Aortic atherosclerosis ([place]) 02/16/2021   COVID-19 01/20/2021   Intractable vomiting with nausea 01/08/2021   Right facial swelling 11/27/2020   Status post repair of ventral hernia 28/78/6767   Umbilical hernia without obstruction and without gangrene    Acoustic trauma (explosive) to ear, bilateral 04/03/2019   Esophagitis 09/02/2016   PUD (peptic ulcer disease) 09/02/2016   Coronary atherosclerosis of native coronary artery 05/04/2016   Tobacco abuse 06/09/2015   History of CVA (cerebrovascular accident) 06/08/2015   IBS (irritable bowel syndrome) 06/08/2015   Nausea 01/17/2015   Chronic back pain 07/12/2014   Dysphagia 04/19/2014   Dental caries 10/03/2012   Fatigue 09/12/2012   HTN (hypertension) 11/30/2011   Herniation of rectum into vagina 08/29/2011   Hyponatremia 03/26/2011   Dyspnea 03/23/2011   CONSTIPATION, CHRONIC 03/19/2010   Osteopenia 03/19/2010   Eustachian tube dysfunction 10/21/2009   UNSPECIFIED CARDIAC DYSRHYTHMIA 03/06/2009   Allergic rhinitis 08/14/2007  Emphysema lung ([place]) 08/14/2007   Hypothyroidism 02/27/2007   HYPERCHOLESTEROLEMIA 02/27/2007   Generalized anxiety disorder 02/27/2007   Depression, major, single episode, moderate ([place]) 02/27/2007   PCP:  Jinny Sanders, MD Pharmacy:   [id number] [place] [id number]: [phone]: [phone] [id number]Phone: [phone]: [phone]    Social Determinants of Health (SDOH) Interventions Food Insecurity  Interventions: Intervention Not Indicated Housing Interventions: Intervention Not Indicated Transportation Interventions: Intervention Not Indicated Utilities Interventions: Intervention Not Indicated  Readmission Risk Interventions     No data to display

## 2022-03-02 NOTE — Progress Notes (Signed)
[facility] NAME: Tiffany Orozco    MR#:  [id number]  DATE OF BIRTH:  Jan 29, 1951  SUBJECTIVE:  husband at bedside. Patient got back from EGD. She ate soft diet. Started having some burning in her epigastric area. No vomiting.   VITALS:  Blood pressure (!) 157/59, pulse (!) 56, temperature 98 F (36.7 C), resp. rate 20, height 5' (1.524 m), weight 74 kg, SpO2 100 %.  PHYSICAL EXAMINATION:   GENERAL:  71 y.o.-year-old patient lying in the bed with no acute distress. Dry oral mucosa. LUNGS: Normal breath sounds bilaterally, no wheezing, rales, rhonchi.  CARDIOVASCULAR: S1, S2 normal. No murmurs, rubs, or gallops.  ABDOMEN: Soft, nontender, nondistended. Bowel sounds present.  EXTREMITIES: No  edema b/l.    NEUROLOGIC: nonfocal  patient is alert and awake SKIN: No obvious rash, lesion, or ulcer.   LABORATORY PANEL:  CBC Recent Labs  Lab 03/01/22 0512  WBC 11.1*  HGB 13.9  HCT 40.0  PLT 338     Chemistries  Recent Labs  Lab 03/01/22 0512 03/01/22 1434 03/02/22 0533  NA 129*  --  131*  K 2.6*   < > 3.8  CL 98  --  100  CO2 25  --  25  GLUCOSE 112*  --  91  BUN 13  --  8  CREATININE 0.55  --  0.66  CALCIUM 8.1*  --  8.3*  MG  --   --  2.2  AST 15  --   --   ALT 15  --   --   ALKPHOS 66  --   --   BILITOT 1.1  --   --    < > = values in this interval not displayed.    Assessment and Plan Glynnis Raliegh Ip Hossain is a 71 y.o. female  with medical history of chronic hyponatremia, prior stroke, COPD, hypothyroid, presents with nausea, vomiting, diarrhea in the setting of subjective fever and cough for the last 2 days.  Intractable nausea vomiting with history of peptic ulcer disease/Gerd -- patient had EGD in 2017 which showed diffuse gastritis and duodenal ulcer. -- Started on IV Protonix drip--change to po bid -- G.I. consultation with Dr. Virgina Jock-- plan is for endoscopy once potassium is replaced --EGD - Non-bleeding  duodenal ulcer with a clean ulcer base                         (Forrest Class III). Biopsied.                        - Gastritis. Biopsied.                        - LA Grade D reflux esophagitis with no bleeding.                         Biopsied.                        - Widely patent Schatzki ring.                        - Esophagogastric landmarks identified. -- clear liquid diet--soft diet -- PRN Zofran --will d/c aggrenox for now (pt was taking for h/o Stroke) -- patient advised smoking cessation. Advised not to take  aspirin or ibuprofen  Hypokalemia suspected due to G.I. loss -- IV potassium repletion per pharmacy  History of COPD -- appears stable. PRN nebs inhaler  Hypothyroidism -- continue thyroid supplement  If patient continues to keep food down and will probably discharge her later today or tomorrow. Patient in agreement  Procedures: EGD Family communication : husband at bedside Consults : G.I. Dr. Virgina Jock CODE STATUS: full DVT Prophylaxis : SCD Level of care: Med-Surg Status is: Inpatient Remains inpatient appropriate because: patient needs potassium replacement, EGD    TOTAL TIME TAKING CARE OF THIS PATIENT: 35 minutes.  >50% time spent on counselling and coordination of care  Note: This dictation was prepared with Dragon dictation along with smaller phrase technology. Any transcriptional errors that result from this process are unintentional.  Fritzi Mandes M.D    [facility]   CC: Primary care physician; Jinny Sanders, MD

## 2022-03-02 NOTE — Discharge Summary (Signed)
Physician Discharge Summary   Patient: Tiffany Orozco MRN: [id number] DOB: [birth year]  Admit date:     02/28/2022  Discharge date: 03/02/22  Discharge Physician: Fritzi Mandes   PCP: Jinny Sanders, MD   Recommendations at discharge:   follow-up PCP in 1 to 2 week follow-up G.I. Dr. Virgina Jock in three weeks  Discharge Diagnoses: Principal Problem:   Intractable vomiting with nausea Active Problems:   PUD (peptic ulcer disease)   Hypothyroidism   Emphysema lung (H[facility])   Hyponatremia   HTN (hypertension)   Tobacco abuse   Coronary atherosclerosis of native coronary artery   Intractable nausea and vomiting   Hospital Course:  Tiffany Orozco is a 71 y.o. female  with medical history of chronic hyponatremia, prior stroke, COPD, hypothyroid, presents with nausea, vomiting, diarrhea in the setting of subjective fever and cough for the last 2 days.   Intractable nausea vomiting with history of peptic ulcer disease/Gerd -- patient had EGD in 2017 which showed diffuse gastritis and duodenal ulcer. -- Started on IV Protonix drip--change to po bid -- G.I. consultation with Dr. Virgina Jock-- plan is for endoscopy once potassium is replaced --EGD - Non-bleeding duodenal ulcer with a clean ulcer base                         (Forrest Class III). Biopsied.                        - Gastritis. Biopsied.                        - LA Grade D reflux esophagitis with no bleeding.                         Biopsied.                        - Widely patent Schatzki ring.                        - Esophagogastric landmarks identified. -- clear liquid diet--soft diet -- PRN Zofran --will d/c aggrenox for now (pt was taking for h/o Stroke) -- patient advised smoking cessation. Advised not to take aspirin or ibuprofen   Hypokalemia suspected due to G.I. loss -- IV potassium repletion per pharmacy   History of COPD -- appears stable. PRN nebs inhaler   Hypothyroidism -- continue thyroid supplement    patient overall tolerating PO diet. Discussed with her discharge plan. She is advised to follow-up with Dr. Virgina Jock G.I. in 2 to 3 weeks.   Procedures: EGD Family communication : husband at bedside Consults : G.I. Dr. Virgina Jock CODE STATUS: full DVT Prophylaxis : SCD Level of care: Med-Surg      Disposition: Home Diet recommendation:  Discharge Diet Orders (From admission, onward)     Start     Ordered   03/02/22 0000  Diet - low sodium heart healthy        03/02/22 1521           Cardiac diet DISCHARGE MEDICATION: Allergies as of 03/02/2022       Reactions   Lisinopril Shortness Of Breath   Niacin Other (See Comments)    dizziness   Welchol [colesevelam Hcl] Diarrhea, Nausea Only, Swelling, Other (See Comments)    swelling in face   Augmentin [amoxicillin-pot  Clavulanate] Nausea Only   GI upset with taking.   Codeine    keeps patient awake   Oxycodone Hcl    Patient states this medication makes her hyper        Medication List     STOP taking these medications    dipyridamole-aspirin 200-25 MG 12hr capsule Commonly known as: AGGRENOX   Flarex 0.1 % ophthalmic suspension Generic drug: fluorometholone   ketoconazole 2 % shampoo Commonly known as: NIZORAL   ondansetron 4 MG disintegrating tablet Commonly known as: ZOFRAN-ODT   triamcinolone cream 0.1 % Commonly known as: KENALOG       TAKE these medications    ezetimibe 10 MG tablet Commonly known as: Zetia Take 1 tablet (10 mg total) by mouth daily.   fluticasone 50 MCG/ACT nasal spray Commonly known as: FLONASE SPRAY 2 SPRAYS INTO EACH NOSTRIL EVERY DAY   nicotine 21 mg/24hr patch Commonly known as: NICODERM CQ - dosed in mg/24 hours Place 1 patch (21 mg total) onto the skin daily. Start taking on: March 03, 2022   pantoprazole 40 MG tablet Commonly known as: Protonix Take 1 tablet (40 mg total) by mouth 2 (two) times daily. What changed: when to take this   sucralfate 1 g  tablet Commonly known as: Carafate Take 1 tablet (1 g total) by mouth 3 (three) times daily as needed.   thyroid 60 MG tablet Commonly known as: Armour Thyroid Take 1 tablet every other day alternating with 90 mg.   Armour Thyroid 90 MG tablet Generic drug: thyroid Take 1 tablet every other day alternating with 60 mg.   Vitamin D3 125 MCG (5000 UT) Tabs Take 5,000 Units by mouth daily.        Follow-up Information     Jinny Sanders, MD. Schedule an appointment as soon as possible for a visit in 1 week(s).   Specialty: Family Medicine Contact information: [place] [address] [phone]         Annamaria Helling, DO. Schedule an appointment as soon as possible for a visit in 3 week(s).   Specialty: Gastroenterology Why: Follow-up peptic ulcer disease Contact information: [facility] [place] [address] [phone]                Discharge Exam: Filed Weights   03/01/22 0009 03/01/22 0500 03/02/22 0500  Weight: 71 kg 74.2 kg 74 kg     Condition at discharge: fair  The results of significant diagnostics from this hospitalization (including imaging, microbiology, ancillary and laboratory) are listed below for reference.   Imaging Studies: CT Abdomen Pelvis W Contrast  Result Date: 02/26/2022 CLINICAL DATA:  Nausea, vomiting, abdominal pain EXAM: CT ABDOMEN AND PELVIS WITH CONTRAST TECHNIQUE: Multidetector CT imaging of the abdomen and pelvis was performed using the standard protocol following bolus administration of intravenous contrast. RADIATION DOSE REDUCTION: This exam was performed according to the departmental dose-optimization program which includes automated exposure control, adjustment of the mA and/or kV according to patient size and/or use of iterative reconstruction technique. CONTRAST:  198m OMNIPAQUE IOHEXOL 300 MG/ML  SOLN COMPARISON:  12/22/2021 FINDINGS: Lower chest: No acute  abnormality Hepatobiliary: No focal liver abnormality is seen. Status post cholecystectomy. No biliary dilatation. Pancreas: No focal abnormality or ductal dilatation. Spleen: No focal abnormality.  Normal size. Adrenals/Urinary Tract: No adrenal abnormality. No focal renal abnormality. No stones or hydronephrosis. Urinary bladder is unremarkable. Stomach/Bowel: Stomach, large and small bowel grossly unremarkable. Vascular/Lymphatic: Aortic  atherosclerosis. No evidence of aneurysm or adenopathy. Reproductive: Uterus and adnexa unremarkable.  No mass. Other: No free fluid or free air. Musculoskeletal: No acute bony abnormality. IMPRESSION: No acute findings in the abdomen or pelvis. Aortic atherosclerosis. Electronically Signed   By: Rolm Baptise M.D.   On: 02/26/2022 01:42   DG Chest 2 View  Result Date: 02/26/2022 CLINICAL DATA:  Cough EXAM: CHEST - 2 VIEW COMPARISON:  12/22/2021 FINDINGS: Heart and mediastinal contours are within normal limits. No focal opacities or effusions. No acute bony abnormality. IMPRESSION: No active cardiopulmonary disease. Electronically Signed   By: Rolm Baptise M.D.   On: 02/26/2022 00:02   CT CHEST LUNG CA SCREEN LOW DOSE W/O CM  Result Date: 02/08/2022 CLINICAL DATA:  71 year old female with 38 pack-year history of smoking. Lung cancer screening. EXAM: CT CHEST WITHOUT CONTRAST LOW-DOSE FOR LUNG CANCER SCREENING TECHNIQUE: Multidetector CT imaging of the chest was performed following the standard protocol without IV contrast. RADIATION DOSE REDUCTION: This exam was performed according to the departmental dose-optimization program which includes automated exposure control, adjustment of the mA and/or kV according to patient size and/or use of iterative reconstruction technique. COMPARISON:  02/05/2021 FINDINGS: Cardiovascular: The heart size is normal. No substantial pericardial effusion. Coronary artery calcification is evident. Mild atherosclerotic calcification is noted in  the wall of the thoracic aorta. Mediastinum/Nodes: Upper normal precarinal lymph node is stable in the interval. No mediastinal lymphadenopathy. No evidence for gross hilar lymphadenopathy although assessment is limited by the lack of intravenous contrast on the current study. The esophagus has normal imaging features. There is no axillary lymphadenopathy. Lungs/Pleura: Biapical pleuroparenchymal scarring evident. No suspicious pulmonary nodule or mass. No focal airspace consolidation. No pleural effusion. Upper Abdomen: Unremarkable. Musculoskeletal: No worrisome lytic or sclerotic osseous abnormality. IMPRESSION: 1. Lung-RADS 1, negative. Continue annual screening with low-dose chest CT without contrast in 12 months. 2.  Aortic Atherosclerois (ICD10-170.0) Electronically Signed   By: Misty Stanley M.D.   On: 02/08/2022 07:54   Microbiology: Results for orders placed or performed during the hospital encounter of 02/25/22  Resp Panel by RT-PCR (Flu A&B, Covid) Anterior Nasal Swab     Status: None   Collection Time: 02/25/22 11:47 PM   Specimen: Anterior Nasal Swab  Result Value Ref Range Status   SARS Coronavirus 2 by RT PCR NEGATIVE NEGATIVE Final    Comment: (NOTE) SARS-CoV-2 target nucleic acids are NOT DETECTED.  The SARS-CoV-2 RNA is generally detectable in upper respiratory specimens during the acute phase of infection. The lowest concentration of SARS-CoV-2 viral copies this assay can detect is 138 copies/mL. A negative result does not preclude SARS-Cov-2 infection and should not be used as the sole basis for treatment or other patient management decisions. A negative result may occur with  improper specimen collection/handling, submission of specimen other than nasopharyngeal swab, presence of viral mutation(s) within the areas targeted by this assay, and inadequate number of viral copies(<138 copies/mL). A negative result must be combined with clinical observations, patient history,  and epidemiological information. The expected result is Negative.  Fact Sheet for Patients:  EntrepreneurPulse.com.au  Fact Sheet for Healthcare Providers:  IncredibleEmployment.be  This test is no t yet approved or cleared by the [place] FDA and  has been authorized for detection and/or diagnosis of SARS-CoV-2 by FDA under an Emergency Use Authorization (EUA). This EUA will remain  in effect (meaning this test can be used) for the duration of the COVID-19 declaration under Section 564(b)(1) of the  Act, 21 U.S.C.section 360bbb-3(b)(1), unless the authorization is terminated  or revoked sooner.       Influenza A by PCR NEGATIVE NEGATIVE Final   Influenza B by PCR NEGATIVE NEGATIVE Final    Comment: (NOTE) The Xpert Xpress SARS-CoV-2/FLU/RSV plus assay is intended as an aid in the diagnosis of influenza from Nasopharyngeal swab specimens and should not be used as a sole basis for treatment. Nasal washings and aspirates are unacceptable for Xpert Xpress SARS-CoV-2/FLU/RSV testing.  Fact Sheet for Patients: EntrepreneurPulse.com.au  Fact Sheet for Healthcare Providers: IncredibleEmployment.be  This test is not yet approved or cleared by the [place] FDA and has been authorized for detection and/or diagnosis of SARS-CoV-2 by FDA under an Emergency Use Authorization (EUA). This EUA will remain in effect (meaning this test can be used) for the duration of the COVID-19 declaration under Section 564(b)(1) of the Act, 21 U.S.C. section 360bbb-3(b)(1), unless the authorization is terminated or revoked.  Performed at [facility], [address] [place], [place] [address]     Labs: CBC: Recent Labs  Lab 02/25/22 1804 02/26/22 2259 02/28/22 1857 03/01/22 0512  WBC 11.9* 8.4 9.8 11.1*  NEUTROABS 8.5* 7.1  --   --   HGB 15.6* 16.0* 16.6* 13.9  HCT 46.3* 45.7 47.2* 40.0  MCV 89.0  88.6 85.4 86.2  PLT 366 333 398 540   Basic Metabolic Panel: Recent Labs  Lab 02/25/22 1804 02/26/22 2259 02/28/22 1857 03/01/22 0512 03/01/22 1434 03/01/22 1800 03/02/22 0533  NA 129* 131* 129* 129*  --   --  131*  K 3.9 3.5 3.2* 2.6* 3.5 3.4* 3.8  CL 95* 93* 91* 98  --   --  100  CO2 '22 24 24 25  '$ --   --  25  GLUCOSE 132* 139* 121* 112*  --   --  91  BUN '16 9 11 13  '$ --   --  8  CREATININE 0.90 0.62 0.60 0.55  --   --  0.66  CALCIUM 9.3 9.2 9.2 8.1*  --   --  8.3*  MG  --   --   --   --   --   --  2.2   Liver Function Tests: Recent Labs  Lab 02/25/22 1804 02/26/22 2259 02/28/22 1857 03/01/22 0512  AST '17 18 22 15  '$ ALT '15 18 20 15  '$ ALKPHOS 90 74 85 66  BILITOT 1.2 1.3* 1.3* 1.1  PROT 8.0 7.6 8.5* 6.3*  ALBUMIN 4.2 4.2 4.6 3.4*   CBG: No results for input(s): "GLUCAP" in the last 168 hours.  Discharge time spent: greater than 30 minutes.  Signed: Fritzi Mandes, MD [facility] 03/02/2022

## 2022-03-02 NOTE — Transfer of Care (Signed)
Immediate Anesthesia Transfer of Care Note  Patient: Tiffany Orozco  Procedure(s) Performed: ESOPHAGOGASTRODUODENOSCOPY (EGD)  Patient Location: PACU and Endoscopy Unit  Anesthesia Type:General  Level of Consciousness: drowsy and patient cooperative  Airway & Oxygen Therapy: Patient Spontanous Breathing  Post-op Assessment: Report given to RN and Post -op Vital signs reviewed and stable  Post vital signs: Reviewed and stable  Last Vitals:  Vitals Value Taken Time  BP 138/66 03/02/22 1135  Temp    Pulse    Resp 16 03/02/22 1135  SpO2 95 % 03/02/22 1135    Last Pain:  Vitals:   03/02/22 0904  TempSrc:   PainSc: 0-No pain      Patients Stated Pain Goal: 0 (09/81/19 1478)  Complications: No notable events documented.

## 2022-03-02 NOTE — Progress Notes (Signed)
Inpatient Follow-up/Progress Note   Patient ID: Tiffany Orozco is a 71 y.o. female.  Overnight Events / Subjective Findings NAEON. Electrolytes being replaced NPO since midnight. Plan for egd today. No further vomiting overnight. Tolerated clear liquids yesterday No other acute gi complaints aside from those listed in ROS.  Review of Systems  Constitutional:  Positive for appetite change. Negative for activity change, chills, diaphoresis, fatigue, fever and unexpected weight change.  HENT:  Positive for trouble swallowing. Negative for voice change.   Respiratory:  Negative for shortness of breath and wheezing.   Cardiovascular:  Negative for chest pain, palpitations and leg swelling.  Gastrointestinal:  Positive for diarrhea, nausea and vomiting. Negative for abdominal distention, abdominal pain, anal bleeding, blood in stool, constipation and rectal pain.  Musculoskeletal:  Negative for arthralgias and myalgias.  Skin:  Negative for color change and pallor.  Neurological:  Positive for weakness. Negative for dizziness and syncope.  Psychiatric/Behavioral:  Negative for confusion.   All other systems reviewed and are negative.    Medications  Current Facility-Administered Medications:    0.9 %  sodium chloride infusion, , Intravenous, Continuous, Adron Bene, Held at 03/01/22 1100   acetaminophen (TYLENOL) tablet 650 mg, 650 mg, Oral, Q6H PRN **OR** acetaminophen (TYLENOL) suppository 650 mg, 650 mg, Rectal, Q6H PRN, Para Skeans, MD   ezetimibe (ZETIA) tablet 10 mg, 10 mg, Oral, Daily, Posey Pronto, Ekta V, MD   fentaNYL (SUBLIMAZE) injection 25 mcg, 25 mcg, Intravenous, Q6H PRN, Florina Ou V, MD, 25 mcg at 03/01/22 2330   fluticasone (FLONASE) 50 MCG/ACT nasal spray 1 spray, 1 spray, Each Nare, Daily, Florina Ou V, MD, 1 spray at 03/02/22 0737   hydrALAZINE (APRESOLINE) injection 5 mg, 5 mg, Intravenous, Q4H PRN, Florina Ou V, MD, 5 mg at 03/01/22 0105   nicotine  (NICODERM CQ - dosed in mg/24 hours) patch 21 mg, 21 mg, Transdermal, Daily, Florina Ou V, MD, 21 mg at 03/02/22 0738   ondansetron (ZOFRAN) injection 4 mg, 4 mg, Intravenous, Q6H PRN, Florina Ou V, MD, 4 mg at 03/01/22 2333   [START ON 03/04/2022] pantoprazole (PROTONIX) injection 40 mg, 40 mg, Intravenous, Q12H, Patel, Ekta V, MD   pantoprozole (PROTONIX) 80 mg /NS 100 mL infusion, 8 mg/hr, Intravenous, Continuous, Florina Ou V, MD, Last Rate: 10 mL/hr at 03/02/22 0700, 8 mg/hr at 03/02/22 0700   phenol (CHLORASEPTIC) mouth spray 1 spray, 1 spray, Mouth/Throat, PRN, Fritzi Mandes, MD, 1 spray at 03/01/22 0832   sodium chloride flush (NS) 0.9 % injection 3 mL, 3 mL, Intravenous, Q12H, Florina Ou V, MD, 3 mL at 03/01/22 2103   sucralfate (CARAFATE) 1 GM/10ML suspension 1 g, 1 g, Oral, TID WC & HS, Fritzi Mandes, MD, 1 g at 03/01/22 2103   thyroid (ARMOUR) tablet 60 mg, 60 mg, Oral, QODAY, Florina Ou V, MD, 60 mg at 03/01/22 0605   thyroid (ARMOUR) tablet 90 mg, 90 mg, Oral, QODAY, Patel, Ekta V, MD, 90 mg at 03/02/22 0600  sodium chloride Stopped (03/01/22 1100)   pantoprazole 8 mg/hr (03/02/22 0700)    acetaminophen **OR** acetaminophen, fentaNYL (SUBLIMAZE) injection, hydrALAZINE, ondansetron (ZOFRAN) IV, phenol   Objective    Vitals:   03/01/22 1540 03/01/22 2010 03/02/22 0500 03/02/22 0551  BP: (!) 166/76 138/62  (!) 142/63  Pulse: 90 75  64  Resp: '18 18  18  '$ Temp: 98.7 F (37.1 C) 97.8 F (36.6 C)  (!) 97.5 F (36.4 C)  TempSrc:  Oral  SpO2: 96% 94%  97%  Weight:   74 kg   Height:         Physical Exam Vitals and nursing note reviewed.  Constitutional:      General: She is not in acute distress.    Appearance: Normal appearance. She is obese. She is not ill-appearing, toxic-appearing or diaphoretic.  HENT:     Head: Normocephalic and atraumatic.     Nose: Nose normal.     Mouth/Throat:     Mouth: Mucous membranes are moist.     Pharynx: Oropharynx is clear.  Eyes:      General: No scleral icterus.    Extraocular Movements: Extraocular movements intact.  Cardiovascular:     Rate and Rhythm: Normal rate and regular rhythm.     Heart sounds: Normal heart sounds. No murmur heard.    No friction rub. No gallop.  Pulmonary:     Effort: Pulmonary effort is normal. No respiratory distress.     Breath sounds: Normal breath sounds. No wheezing, rhonchi or rales.  Abdominal:     General: Bowel sounds are normal. There is no distension.     Palpations: Abdomen is soft.     Tenderness: There is no abdominal tenderness. There is no guarding or rebound.  Musculoskeletal:     Cervical back: Neck supple.     Right lower leg: No edema.     Left lower leg: No edema.  Skin:    General: Skin is warm and dry.     Coloration: Skin is not jaundiced or pale.  Neurological:     General: No focal deficit present.     Mental Status: She is alert and oriented to person, place, and time. Mental status is at baseline.  Psychiatric:        Mood and Affect: Mood normal.        Behavior: Behavior normal.        Thought Content: Thought content normal.        Judgment: Judgment normal.      Laboratory Data Recent Labs  Lab 02/25/22 1804 02/26/22 2259 02/28/22 1857 03/01/22 0512  WBC 11.9* 8.4 9.8 11.1*  HGB 15.6* 16.0* 16.6* 13.9  HCT 46.3* 45.7 47.2* 40.0  PLT 366 333 398 338  NEUTOPHILPCT 72 84  --   --   LYMPHOPCT 17 10  --   --   MONOPCT 11 5  --   --   EOSPCT 0 0  --   --    Recent Labs  Lab 02/26/22 2259 02/28/22 1857 03/01/22 0512 03/01/22 1434 03/01/22 1800 03/02/22 0533  NA 131* 129* 129*  --   --  131*  K 3.5 3.2* 2.6* 3.5 3.4* 3.8  CL 93* 91* 98  --   --  100  CO2 '24 24 25  '$ --   --  25  BUN '9 11 13  '$ --   --  8  CREATININE 0.62 0.60 0.55  --   --  0.66  CALCIUM 9.2 9.2 8.1*  --   --  8.3*  PROT 7.6 8.5* 6.3*  --   --   --   BILITOT 1.3* 1.3* 1.1  --   --   --   ALKPHOS 74 85 66  --   --   --   ALT '18 20 15  '$ --   --   --   AST '18 22 15   '$ --   --   --   GLUCOSE  139* 121* 112*  --   --  91   No results for input(s): "INR" in the last 168 hours.    Imaging Studies: No results found.  Assessment:  # Intractable nausea vomiting - occurs in pattern fitting cyclic vomiting syndrome - she has at least attempted multiple tests with her previous GI physicians, but has not been able to complete all of them - Normal GES in 2017 (only able to complete 2 hours but was 88% empty) - EGDs in the past have demonstrated esophagitis (grade c) - not able to complete manometry/motility study in past - tsh wnl as of august and June  # GERD with esophagitis - on protonix 40 mg daily at home  # hypersensitive gag reflex  # peptic ulcer disease - negative for h pylori on previous bx  # dysphagia -  has undergone multiple egd with empiric dilations 2016- 24 HR pH impedance testing - [place]ter of 37.1 with positive symptom index Manometry - EGJ outflow obstruction, mildly elevated IRP Barium study 01/2015 - some dysmotility noted, ? Stricture near GEJ  # h/o schatskis ring  # loose stools- chronic - s/p cholecystectomy  # Hyponatremia - chronic issue - recommend outpatient endocrine/nephrology work up  # IBS - previously improved with Xifaxin  # Tobacco dependence - suspect aerophagia component - long standing hyponatremia- consider SIADH- although unlikely to have lung mass causing this given how longstanding her hyponatremia has been (issues since 2010)  # COPD  # Anxiety/Depression  # Fhx CRC and Esophageal ca- Sister  Plan:  Plan for EGD today Continue bid ppi Can discontinue carafate as it has no benefit in this scenario NPO since midnight Morning labs reviewed Hold aggrenox; suspect she was hemoconcentrated and now with ivf resuscitation that hgb decreased. No activfe signs of gib Supportive care and antiemetics as per primary team Due for screening colonoscopy to be performed in outpatient setting. Suspect  pt will benefit from TCA  Esophagogastroduodenoscopy with possible biopsy, control of bleeding, polypectomy, and interventions as necessary has been discussed with the patient/patient representative. Informed consent was obtained from the patient/patient representative after explaining the indication, nature, and risks of the procedure including but not limited to death, bleeding, perforation, missed neoplasm/lesions, cardiorespiratory compromise, and reaction to medications. Opportunity for questions was given and appropriate answers were provided. Patient/patient representative has verbalized understanding is amenable to undergoing the procedure.   Further recommendations pending endoscopy. See op report for further details  I personally performed the service.  Management of other medical comorbidities as per primary team  Thank you for allowing [place] to participate in this patient's care. Please don't hesitate to call if any questions or concerns arise.   Annamaria Helling, DO [facility]  Portions of the record may have been created with voice recognition software. Occasional wrong-word or 'sound-a-like' substitutions may have occurred due to the inherent limitations of voice recognition software.  Read the chart carefully and recognize, using context, where substitutions may have occurred.

## 2022-03-02 NOTE — H&P (View-Only) (Signed)
Inpatient Follow-up/Progress Note   Patient ID: Tiffany Orozco is a 71 y.o. female.  Overnight Events / Subjective Findings NAEON. Electrolytes being replaced NPO since midnight. Plan for egd today. No further vomiting overnight. Tolerated clear liquids yesterday No other acute gi complaints aside from those listed in ROS.  Review of Systems  Constitutional:  Positive for appetite change. Negative for activity change, chills, diaphoresis, fatigue, fever and unexpected weight change.  HENT:  Positive for trouble swallowing. Negative for voice change.   Respiratory:  Negative for shortness of breath and wheezing.   Cardiovascular:  Negative for chest pain, palpitations and leg swelling.  Gastrointestinal:  Positive for diarrhea, nausea and vomiting. Negative for abdominal distention, abdominal pain, anal bleeding, blood in stool, constipation and rectal pain.  Musculoskeletal:  Negative for arthralgias and myalgias.  Skin:  Negative for color change and pallor.  Neurological:  Positive for weakness. Negative for dizziness and syncope.  Psychiatric/Behavioral:  Negative for confusion.   All other systems reviewed and are negative.    Medications  Current Facility-Administered Medications:    0.9 %  sodium chloride infusion, , Intravenous, Continuous, Adron Bene, Held at 03/01/22 1100   acetaminophen (TYLENOL) tablet 650 mg, 650 mg, Oral, Q6H PRN **OR** acetaminophen (TYLENOL) suppository 650 mg, 650 mg, Rectal, Q6H PRN, Para Skeans, MD   ezetimibe (ZETIA) tablet 10 mg, 10 mg, Oral, Daily, Posey Pronto, Ekta V, MD   fentaNYL (SUBLIMAZE) injection 25 mcg, 25 mcg, Intravenous, Q6H PRN, Florina Ou V, MD, 25 mcg at 03/01/22 2330   fluticasone (FLONASE) 50 MCG/ACT nasal spray 1 spray, 1 spray, Each Nare, Daily, Florina Ou V, MD, 1 spray at 03/02/22 0737   hydrALAZINE (APRESOLINE) injection 5 mg, 5 mg, Intravenous, Q4H PRN, Florina Ou V, MD, 5 mg at 03/01/22 0105   nicotine  (NICODERM CQ - dosed in mg/24 hours) patch 21 mg, 21 mg, Transdermal, Daily, Florina Ou V, MD, 21 mg at 03/02/22 0738   ondansetron (ZOFRAN) injection 4 mg, 4 mg, Intravenous, Q6H PRN, Florina Ou V, MD, 4 mg at 03/01/22 2333   [START ON 03/04/2022] pantoprazole (PROTONIX) injection 40 mg, 40 mg, Intravenous, Q12H, Patel, Ekta V, MD   pantoprozole (PROTONIX) 80 mg /NS 100 mL infusion, 8 mg/hr, Intravenous, Continuous, Florina Ou V, MD, Last Rate: 10 mL/hr at 03/02/22 0700, 8 mg/hr at 03/02/22 0700   phenol (CHLORASEPTIC) mouth spray 1 spray, 1 spray, Mouth/Throat, PRN, Fritzi Mandes, MD, 1 spray at 03/01/22 0832   sodium chloride flush (NS) 0.9 % injection 3 mL, 3 mL, Intravenous, Q12H, Florina Ou V, MD, 3 mL at 03/01/22 2103   sucralfate (CARAFATE) 1 GM/10ML suspension 1 g, 1 g, Oral, TID WC & HS, Fritzi Mandes, MD, 1 g at 03/01/22 2103   thyroid (ARMOUR) tablet 60 mg, 60 mg, Oral, QODAY, Florina Ou V, MD, 60 mg at 03/01/22 0605   thyroid (ARMOUR) tablet 90 mg, 90 mg, Oral, QODAY, Patel, Ekta V, MD, 90 mg at 03/02/22 0600  sodium chloride Stopped (03/01/22 1100)   pantoprazole 8 mg/hr (03/02/22 0700)    acetaminophen **OR** acetaminophen, fentaNYL (SUBLIMAZE) injection, hydrALAZINE, ondansetron (ZOFRAN) IV, phenol   Objective    Vitals:   03/01/22 1540 03/01/22 2010 03/02/22 0500 03/02/22 0551  BP: (!) 166/76 138/62  (!) 142/63  Pulse: 90 75  64  Resp: '18 18  18  '$ Temp: 98.7 F (37.1 C) 97.8 F (36.6 C)  (!) 97.5 F (36.4 C)  TempSrc:  Oral  SpO2: 96% 94%  97%  Weight:   74 kg   Height:         Physical Exam Vitals and nursing note reviewed.  Constitutional:      General: She is not in acute distress.    Appearance: Normal appearance. She is obese. She is not ill-appearing, toxic-appearing or diaphoretic.  HENT:     Head: Normocephalic and atraumatic.     Nose: Nose normal.     Mouth/Throat:     Mouth: Mucous membranes are moist.     Pharynx: Oropharynx is clear.  Eyes:      General: No scleral icterus.    Extraocular Movements: Extraocular movements intact.  Cardiovascular:     Rate and Rhythm: Normal rate and regular rhythm.     Heart sounds: Normal heart sounds. No murmur heard.    No friction rub. No gallop.  Pulmonary:     Effort: Pulmonary effort is normal. No respiratory distress.     Breath sounds: Normal breath sounds. No wheezing, rhonchi or rales.  Abdominal:     General: Bowel sounds are normal. There is no distension.     Palpations: Abdomen is soft.     Tenderness: There is no abdominal tenderness. There is no guarding or rebound.  Musculoskeletal:     Cervical back: Neck supple.     Right lower leg: No edema.     Left lower leg: No edema.  Skin:    General: Skin is warm and dry.     Coloration: Skin is not jaundiced or pale.  Neurological:     General: No focal deficit present.     Mental Status: She is alert and oriented to person, place, and time. Mental status is at baseline.  Psychiatric:        Mood and Affect: Mood normal.        Behavior: Behavior normal.        Thought Content: Thought content normal.        Judgment: Judgment normal.      Laboratory Data Recent Labs  Lab 02/25/22 1804 02/26/22 2259 02/28/22 1857 03/01/22 0512  WBC 11.9* 8.4 9.8 11.1*  HGB 15.6* 16.0* 16.6* 13.9  HCT 46.3* 45.7 47.2* 40.0  PLT 366 333 398 338  NEUTOPHILPCT 72 84  --   --   LYMPHOPCT 17 10  --   --   MONOPCT 11 5  --   --   EOSPCT 0 0  --   --    Recent Labs  Lab 02/26/22 2259 02/28/22 1857 03/01/22 0512 03/01/22 1434 03/01/22 1800 03/02/22 0533  NA 131* 129* 129*  --   --  131*  K 3.5 3.2* 2.6* 3.5 3.4* 3.8  CL 93* 91* 98  --   --  100  CO2 '24 24 25  '$ --   --  25  BUN '9 11 13  '$ --   --  8  CREATININE 0.62 0.60 0.55  --   --  0.66  CALCIUM 9.2 9.2 8.1*  --   --  8.3*  PROT 7.6 8.5* 6.3*  --   --   --   BILITOT 1.3* 1.3* 1.1  --   --   --   ALKPHOS 74 85 66  --   --   --   ALT '18 20 15  '$ --   --   --   AST '18 22 15   '$ --   --   --   GLUCOSE  139* 121* 112*  --   --  91   No results for input(s): "INR" in the last 168 hours.    Imaging Studies: No results found.  Assessment:  # Intractable nausea vomiting - occurs in pattern fitting cyclic vomiting syndrome - she has at least attempted multiple tests with her previous GI physicians, but has not been able to complete all of them - Normal GES in 2017 (only able to complete 2 hours but was 88% empty) - EGDs in the past have demonstrated esophagitis (grade c) - not able to complete manometry/motility study in past - tsh wnl as of august and June  # GERD with esophagitis - on protonix 40 mg daily at home  # hypersensitive gag reflex  # peptic ulcer disease - negative for h pylori on previous bx  # dysphagia -  has undergone multiple egd with empiric dilations 2016- 24 HR pH impedance testing - [place]ter of 37.1 with positive symptom index Manometry - EGJ outflow obstruction, mildly elevated IRP Barium study 01/2015 - some dysmotility noted, ? Stricture near GEJ  # h/o schatskis ring  # loose stools- chronic - s/p cholecystectomy  # Hyponatremia - chronic issue - recommend outpatient endocrine/nephrology work up  # IBS - previously improved with Xifaxin  # Tobacco dependence - suspect aerophagia component - long standing hyponatremia- consider SIADH- although unlikely to have lung mass causing this given how longstanding her hyponatremia has been (issues since 2010)  # COPD  # Anxiety/Depression  # Fhx CRC and Esophageal ca- Sister  Plan:  Plan for EGD today Continue bid ppi Can discontinue carafate as it has no benefit in this scenario NPO since midnight Morning labs reviewed Hold aggrenox; suspect she was hemoconcentrated and now with ivf resuscitation that hgb decreased. No activfe signs of gib Supportive care and antiemetics as per primary team Due for screening colonoscopy to be performed in outpatient setting. Suspect  pt will benefit from TCA  Esophagogastroduodenoscopy with possible biopsy, control of bleeding, polypectomy, and interventions as necessary has been discussed with the patient/patient representative. Informed consent was obtained from the patient/patient representative after explaining the indication, nature, and risks of the procedure including but not limited to death, bleeding, perforation, missed neoplasm/lesions, cardiorespiratory compromise, and reaction to medications. Opportunity for questions was given and appropriate answers were provided. Patient/patient representative has verbalized understanding is amenable to undergoing the procedure.   Further recommendations pending endoscopy. See op report for further details  I personally performed the service.  Management of other medical comorbidities as per primary team  Thank you for allowing [place] to participate in this patient's care. Please don't hesitate to call if any questions or concerns arise.   Annamaria Helling, DO [facility]  Portions of the record may have been created with voice recognition software. Occasional wrong-word or 'sound-a-like' substitutions may have occurred due to the inherent limitations of voice recognition software.  Read the chart carefully and recognize, using context, where substitutions may have occurred.

## 2022-03-02 NOTE — Progress Notes (Signed)
PHARMACY CONSULT NOTE - FOLLOW UP  Pharmacy Consult for Electrolyte Monitoring and Replacement   Recent Labs: Potassium (mmol/L)  Date Value  03/02/2022 3.8   Magnesium (mg/dL)  Date Value  03/02/2022 2.2   Calcium (mg/dL)  Date Value  03/02/2022 8.3 (L)   Albumin (g/dL)  Date Value  03/01/2022 3.4 (L)   Phosphorus (mg/dL)  Date Value  06/12/2015 3.0   Sodium (mmol/L)  Date Value  03/02/2022 131 (L)     Assessment: 71 year old female admitted with n/v, melena and concern for GIB.  Past medical history includes IBS, HTN, heart murmur.   Renal function stable and c/w baseline.   Goal of Therapy:  K > 4 Mag > 2  Plan:  K+ near goal at 3.8. Planned EGD this AM, thus will defer replacement. Follow up labs tomorrow AM  Glean Salvo, PharmD Clinical Pharmacist  03/02/2022 7:28 AM

## 2022-03-02 NOTE — Anesthesia Preprocedure Evaluation (Signed)
Anesthesia Evaluation  Patient identified by MRN, date of birth, ID band Patient awake    Reviewed: Allergy & Precautions, NPO status , Patient's Chart, lab work & pertinent test results  History of Anesthesia Complications Negative for: history of anesthetic complications  Airway Mallampati: III  TM Distance: <3 FB Neck ROM: full    Dental  (+) Chipped, Poor Dentition, Missing   Pulmonary shortness of breath and with exertion, COPD, Current Smoker and Patient abstained from smoking.,    Pulmonary exam normal        Cardiovascular hypertension, + CAD  Normal cardiovascular exam     Neuro/Psych CVA negative psych ROS   GI/Hepatic Neg liver ROS, PUD, GERD  Controlled,  Endo/Other  Hypothyroidism   Renal/GU negative Renal ROS  negative genitourinary   Musculoskeletal   Abdominal   Peds  Hematology negative hematology ROS (+)   Anesthesia Other Findings Patient is NPO appropriate and reports no nausea or vomiting today.  Past Medical History: No date: Allergy No date: Anxiety No date: COPD (chronic obstructive pulmonary disease) (HCC) No date: DDD (degenerative disc disease), lumbar     Comment:  mild No date: Depression No date: Female rectocele without uterine prolapse No date: GERD (gastroesophageal reflux disease) No date: Hearing loss No date: Heart murmur No date: Hyperlipidemia No date: Hypertension No date: Hypothyroidism No date: IBS (irritable bowel syndrome) 06/2015: Influenza A No date: Obesity No date: Osteopenia No date: Plantar fasciitis No date: Pneumonia No date: Skin cancer of arm No date: Skin cancer of face 07/2010: Stroke [facility])     Comment:  no permenant deficits No date: Thrombocytopenia ([place]) No date: Tubal pregnancy  Past Surgical History: No date: APPENDECTOMY 2004: CHOLECYSTECTOMY No date: COLONOSCOPY No date: CYST EXCISION; Right     Comment:  breast mole 7/04: EMGs      Comment:  atms and wrists neg 03/17/2015: ESOPHAGEAL MANOMETRY; N/A     Comment:  Procedure: ESOPHAGEAL MANOMETRY (EM);  Surgeon: Manus Gunning, MD;  Location: [facility] END[facility];  Service:               Gastroenterology;  Laterality: N/A; 2008, 2009, 2010: FOOT SURGERY; Right     Comment:  torn ligament, hammertoes 1975: TUBAL LIGATION No date: UPPER GI ENDOSCOPY 04/14/2020: XI ROBOTIC ASSISTED VENTRAL HERNIA; N/A     Comment:  Procedure: XI ROBOTIC ASSISTED VENTRAL HERNIA;  Surgeon:              Fredirick Maudlin, MD;  Location: [facility] ORS;  Service:               General;  Laterality: N/A;  BMI    Body Mass Index: 31.86 kg/m      Reproductive/Obstetrics negative OB ROS                             Anesthesia Physical Anesthesia Plan  ASA: 3  Anesthesia Plan: General   Post-op Pain Management:    Induction: Intravenous  PONV Risk Score and Plan: Propofol infusion and TIVA  Airway Management Planned: Natural Airway and Nasal Cannula  Additional Equipment:   Intra-op Plan:   Post-operative Plan:   Informed Consent: I have reviewed the patients History and Physical, chart, labs and discussed the procedure including the risks, benefits and alternatives for the proposed anesthesia with the patient or authorized representative who has indicated  his/her understanding and acceptance.     Dental Advisory Given  Plan Discussed with: Anesthesiologist, CRNA and Surgeon  Anesthesia Plan Comments: (Patient consented for risks of anesthesia including but not limited to:  - adverse reactions to medications - risk of airway placement if required - damage to eyes, teeth, lips or other oral mucosa - nerve damage due to positioning  - sore throat or hoarseness - Damage to heart, brain, nerves, lungs, other parts of body or loss of life  Patient voiced understanding.)        Anesthesia Quick Evaluation

## 2022-03-03 ENCOUNTER — Telehealth: Payer: Self-pay | Admitting: *Deleted

## 2022-03-03 ENCOUNTER — Encounter: Payer: Self-pay | Admitting: Gastroenterology

## 2022-03-03 DIAGNOSIS — Z720 Tobacco use: Secondary | ICD-10-CM

## 2022-03-03 NOTE — Patient Outreach (Signed)
  Care Coordination TOC Note Transition Care Management Follow-up Telephone Call Date of discharge and from where: [facility] [phone] How have you been since you were released from the hospital? I don't feel all that good nut I had my 1st bowel movement in a long time and that made it better Any questions or concerns? Yes I was started on the  smoking cessation patch and it makes my heart rate and blood pressure increase. My insurance doesn't cover it. It costs $80 Items Reviewed: Did the pt receive and understand the discharge instructions provided? Yes  Medications obtained and verified? Yes  Other? No  Any new allergies since your discharge? No  Dietary orders reviewed? Yes Do you have support at home? Yes  husband and daughter  Home Care and Equipment/Supplies: Were home health services ordered? no If so, what is the name of the agency? N/a  Has the agency set up a time to come to the patient's home? no Were any new equipment or medical supplies ordered?  No What is the name of the medical supply agency? N/a Were you able to get the supplies/equipment? no Do you have any questions related to the use of the equipment or supplies? No  Functional Questionnaire: (I = Independent and D = Dependent) ADLs: I  Bathing/Dressing- I  Meal Prep- I  Eating- I  Maintaining continence- I  Transferring/Ambulation- I  Managing Meds- I  Follow up appointments reviewed:  PCP Hospital f/u appt confirmed? Yes  Scheduled to see Dr [name] [phone] 11:40 [facility] f/u appt confirmed? No   Are transportation arrangements needed? No  If their condition worsens, is the pt aware to call PCP or go to the Emergency Dept.? Yes Was the patient provided with contact information for the PCP's office or ED? Yes Was to pt encouraged to call back with questions or concerns? Yes  SDOH assessments and interventions completed:   Yes  Care Coordination Interventions Activated:  Yes   Care  Coordination Interventions:  Referred for Care Coordination Services:  Pharmacist   Encounter Outcome:  Pt. Visit Completed    [facility] [phone]

## 2022-03-04 LAB — SURGICAL PATHOLOGY

## 2022-03-08 ENCOUNTER — Telehealth: Payer: Self-pay

## 2022-03-08 NOTE — Chronic Care Management (AMB) (Addendum)
Chronic Care Management Pharmacy Assistant   Name: Tiffany Orozco  MRN: [id number] DOB: [birth year]   Reason for Encounter: Hospital Follow Up Non CCM     Medications: Outpatient Encounter Medications as of 03/08/2022  Medication Sig   ARMOUR THYROID 90 MG tablet Take 1 tablet every other day alternating with 60 mg.   Cholecalciferol (VITAMIN D3) 125 MCG (5000 UT) TABS Take 5,000 Units by mouth daily.   ezetimibe (ZETIA) 10 MG tablet Take 1 tablet (10 mg total) by mouth daily.   fluticasone (FLONASE) 50 MCG/ACT nasal spray SPRAY 2 SPRAYS INTO EACH NOSTRIL EVERY DAY   nicotine (NICODERM CQ - DOSED IN MG/24 HOURS) 21 mg/24hr patch Place 1 patch (21 mg total) onto the skin daily.   pantoprazole (PROTONIX) 40 MG tablet Take 1 tablet (40 mg total) by mouth 2 (two) times daily.   sucralfate (CARAFATE) 1 g tablet Take 1 tablet (1 g total) by mouth 3 (three) times daily as needed.   thyroid (ARMOUR THYROID) 60 MG tablet Take 1 tablet every other day alternating with 90 mg.   No facility-administered encounter medications on file as of 03/08/2022.    Reviewed hospital notes for details of recent visit. Has patient been contacted by Transitions of Care team? Yes Has patient seen PCP/specialist for hospital follow up (summarize OV if yes): Yes  03/02/22-ESOPHAGOGASTRODUODENOSCOPY (EGD)  Admitted to the hospital on 02/28/22. Discharge date was 03/02/22.  Discharged from [facility].   Discharge diagnosis (Principal Problem): Intractable vomiting  Patient was discharged to Home  Brief summary of hospital course: Patient presented to the emergency department today because of concerns for continued nausea and vomiting.  This is now her third ER visit in the past week.  Blood work does show hyponatremia and hypokalemia consistent with poor oral intake.  Given continued symptoms and electrolyte abnormalities do think patient would benefit from further work-up and management.  Discussed with Dr.  Posey Pronto with the hospitalist service will plan on admission--- G.I. consultation with Dr. Virgina Jock-- plan is for endoscopy once potassium is replaced   Next CCM appt: none  Other upcoming appts: PCP appointment on 03/10/22  Charlene Brooke, PharmD notified and will determine if action is needed.  Tiffany Orozco, [place]  [phone]   Pharmacist addendum: Hospital course: medical history of chronic hyponatremia, prior stroke, COPD, hypothyroid, presents with nausea, vomiting, diarrhea in the setting of subjective fever and cough for the last 2 days.   Intractable nausea vomiting with history of peptic ulcer disease/Gerd -- patient had EGD in 2017 which showed diffuse gastritis and duodenal ulcer. -- Started on IV Protonix drip--change to po bid -- G.I. consultation with Dr. Virgina Jock-- plan is for endoscopy once potassium is replaced --EGD - Non-bleeding duodenal ulcer with a clean ulcer base                         (Forrest Class III). Biopsied.                        - Gastritis. Biopsied.                        - LA Grade D reflux esophagitis with no bleeding.                         Biopsied.                        -  Widely patent Schatzki ring.                        - Esophagogastric landmarks identified. -- clear liquid diet--soft diet -- PRN Zofran --will d/c aggrenox for now (pt was taking for h/o Stroke) -- patient advised smoking cessation. Advised not to take aspirin or ibuprofen   Hypokalemia suspected due to G.I. loss -- IV potassium repletion per pharmacy   Patient has followed up with PCP, she is off Aggrenox and taking pantoprazole BID as recommended. Nothing further needed at this time.  Charlene Brooke, PharmD, BCACP 03/11/22 4:33 PM

## 2022-03-10 ENCOUNTER — Ambulatory Visit (INDEPENDENT_AMBULATORY_CARE_PROVIDER_SITE_OTHER): Payer: Medicare Other | Admitting: Family Medicine

## 2022-03-10 ENCOUNTER — Telehealth: Payer: Self-pay | Admitting: Pharmacist

## 2022-03-10 ENCOUNTER — Encounter: Payer: Self-pay | Admitting: Family Medicine

## 2022-03-10 VITALS — BP 132/90 | HR 73 | Temp 97.6°F | Ht 59.0 in | Wt 162.2 lb

## 2022-03-10 DIAGNOSIS — K222 Esophageal obstruction: Secondary | ICD-10-CM

## 2022-03-10 DIAGNOSIS — K209 Esophagitis, unspecified without bleeding: Secondary | ICD-10-CM | POA: Diagnosis not present

## 2022-03-10 DIAGNOSIS — K279 Peptic ulcer, site unspecified, unspecified as acute or chronic, without hemorrhage or perforation: Secondary | ICD-10-CM | POA: Diagnosis not present

## 2022-03-10 DIAGNOSIS — R42 Dizziness and giddiness: Secondary | ICD-10-CM

## 2022-03-10 DIAGNOSIS — E039 Hypothyroidism, unspecified: Secondary | ICD-10-CM | POA: Diagnosis not present

## 2022-03-10 DIAGNOSIS — Z8673 Personal history of transient ischemic attack (TIA), and cerebral infarction without residual deficits: Secondary | ICD-10-CM | POA: Diagnosis not present

## 2022-03-10 LAB — CBC WITH DIFFERENTIAL/PLATELET
Basophils Absolute: 0.1 10*3/uL (ref 0.0–0.1)
Basophils Relative: 1.3 % (ref 0.0–3.0)
Eosinophils Absolute: 0.1 10*3/uL (ref 0.0–0.7)
Eosinophils Relative: 2.1 % (ref 0.0–5.0)
HCT: 44.8 % (ref 36.0–46.0)
Hemoglobin: 15.1 g/dL — ABNORMAL HIGH (ref 12.0–15.0)
Lymphocytes Relative: 34.7 % (ref 12.0–46.0)
Lymphs Abs: 2.2 10*3/uL (ref 0.7–4.0)
MCHC: 33.6 g/dL (ref 30.0–36.0)
MCV: 92 fl (ref 78.0–100.0)
Monocytes Absolute: 0.5 10*3/uL (ref 0.1–1.0)
Monocytes Relative: 8.3 % (ref 3.0–12.0)
Neutro Abs: 3.4 10*3/uL (ref 1.4–7.7)
Neutrophils Relative %: 53.6 % (ref 43.0–77.0)
Platelets: 377 10*3/uL (ref 150.0–400.0)
RBC: 4.87 Mil/uL (ref 3.87–5.11)
RDW: 13.9 % (ref 11.5–15.5)
WBC: 6.4 10*3/uL (ref 4.0–10.5)

## 2022-03-10 LAB — BASIC METABOLIC PANEL
BUN: 11 mg/dL (ref 6–23)
CO2: 27 mEq/L (ref 19–32)
Calcium: 9.4 mg/dL (ref 8.4–10.5)
Chloride: 98 mEq/L (ref 96–112)
Creatinine, Ser: 0.63 mg/dL (ref 0.40–1.20)
GFR: 88.01 mL/min (ref >60.00)
Glucose, Bld: 82 mg/dL (ref 70–99)
Potassium: 4.7 mEq/L (ref 3.5–5.1)
Sodium: 134 mEq/L — ABNORMAL LOW (ref 135–145)

## 2022-03-10 LAB — TSH: TSH: 1.55 u[IU]/mL (ref 0.35–5.50)

## 2022-03-10 NOTE — Progress Notes (Signed)
[facility])                                            [place] Team    03/10/2022  Tiffany Orozco 09-01-1950 [id number]  Patient was referred for medication assistance with Nicotine Patches and because she felt like her patches were too strong.  Patient was called.  HIPAA identifiers were obtained. Patient said she was not interested in pursing help with Nicotine patches or quitting smoking at this time. She reported smoking 8 cigarettes per day. Per dosing guidelines, the recommended dose is '14mg'$ /24 hr. Patient was prescribed '21mg'$ /24hr patch. Patient was asked about nicotine lozenges, gum and inhaler and she declined all therapy.  Patient was encouraged to call me back or let her [facility] Nurse know if she would like to pursue help with nicotine replacement therapy.   Will send note to [place] and close the case .  Plan: Route note to [facility] Nurse.  Elayne Guerin, PharmD, [place] Clinical Pharmacist [phone]

## 2022-03-10 NOTE — Assessment & Plan Note (Signed)
Taking the proton pump inhibitor twice daily could be inhibiting absorption of Armour Thyroid.  We will reevaluate with TSH today.

## 2022-03-10 NOTE — Assessment & Plan Note (Signed)
Previously placed on Aggrenox for past stroke.  This medication was stopped upon hospitalization given possible stomach irritation.  She will discuss with GI whether low-dose enteric-coated aspirin 81 mg would be appropriate.

## 2022-03-10 NOTE — Assessment & Plan Note (Signed)
Acute, will evaluate with labs for etiology such as anemia, electrolyte imbalance, change in thyroid function.  She is currently keeping up with hydration by pushing fluids.

## 2022-03-10 NOTE — Patient Instructions (Addendum)
Please stop at the lab to have labs drawn.  Apply heat and elevation of right arm for injury from IV placement.  Continue pantoprazole twice daily .Marland Kitchen As far from armour thyroid as you can.  Discuss with GI possible start of low dose baby aspirin for anticoagulation given CVA history.

## 2022-03-10 NOTE — Assessment & Plan Note (Signed)
Now on pantoprazole 40 mg twice daily.  Has upcoming follow-up with GI Dr. Virgina Jock.

## 2022-03-10 NOTE — Progress Notes (Signed)
Patient ID: Tiffany Orozco, female    DOB: [birth year], 71 y.o.   MRN: [id number]  This visit was conducted in person. Blood pressure (!) 132/90, pulse 73, temperature 97.6 F (36.4 C), temperature source Temporal, height '4\' 11"'$  (1.499 m), weight 162 lb 3.2 oz (73.6 kg), SpO2 97 %.   CC: Chief Complaint  Patient presents with   Hospitalization Follow-up    Dehydrated, SOB    Subjective:   [name] is a 71 y.o. female presenting on 03/10/2022 for Hospitalization Follow-up (Dehydrating )  Reviewed hospital discharge summary from February 28, 2022 She had been admitted for intractable vomiting with nausea, diarrhea, subjective fever and cough peptic ulcer disease, hyponatremia.   In hospital ER  9/28, 9/29 and returned 10/1  patient had EGD in 2017 which showed diffuse gastritis and duodenal ulcer so -- Started on IV Protonix drip--change to po bid -- G.I. consultation with Dr. Virgina Jock-- plan is for endoscopy once potassium is replaced --EGD - Non-bleeding duodenal ulcer with a clean ulcer base                         (Forrest Class III). Biopsied.                        - Gastritis. Biopsied.                        - LA Grade D reflux esophagitis with no bleeding.                         Biopsied.                        - Widely patent Schatzki ring.                        - Esophagogastric landmarks identified. -- clear liquid diet--soft diet -- PRN Zofran --will d/c aggrenox for now (pt was taking for h/o Stroke) -- patient advised smoking cessation. Advised not to take aspirin or ibuprofen   Hypokalemia suspected due to G.I. loss -- IV potassium repletion per pharmacy   Since she has been home she has not thrown up... but dizzy and weakness.  No fever.  No abdominal pain  Some pain in right antecubital fossa where IV was... was hurting in hospital.    On pantoprazole BID.. having trouble taking given can decrease absorption of armour thyroid.  Using  sulcrafate.   Has follow up with Gi next week.    Cannot use nicotine patch... restarted smoking.  She is eating soft foods, bland. She is drinking  water, tea, lemonade.  Relevant past medical, surgical, family and social history reviewed and updated as indicated. Interim medical history since our last visit reviewed. Allergies and medications reviewed and updated. Outpatient Medications Prior to Visit  Medication Sig Dispense Refill   ARMOUR THYROID 90 MG tablet Take 1 tablet every other day alternating with 60 mg. 45 tablet 3   Cholecalciferol (VITAMIN D3) 125 MCG (5000 UT) TABS Take 5,000 Units by mouth daily.     ezetimibe (ZETIA) 10 MG tablet Take 1 tablet (10 mg total) by mouth daily. 90 tablet 3   fluticasone (FLONASE) 50 MCG/ACT nasal spray SPRAY 2 SPRAYS INTO EACH NOSTRIL EVERY DAY 48 mL 1   pantoprazole (PROTONIX) 40  MG tablet Take 1 tablet (40 mg total) by mouth 2 (two) times daily. 90 tablet 3   sucralfate (CARAFATE) 1 g tablet Take 1 tablet (1 g total) by mouth 3 (three) times daily as needed. 90 tablet 3   thyroid (ARMOUR THYROID) 60 MG tablet Take 1 tablet every other day alternating with 90 mg. 45 tablet 3   nicotine (NICODERM CQ - DOSED IN MG/24 HOURS) 21 mg/24hr patch Place 1 patch (21 mg total) onto the skin daily. (Patient not taking: Reported on 03/10/2022) 28 patch 0   No facility-administered medications prior to visit.     Per HPI unless specifically indicated in ROS section below Review of Systems  Constitutional:  Negative for fatigue and fever.  HENT:  Negative for congestion.   Eyes:  Negative for pain.  Respiratory:  Negative for cough and shortness of breath.   Cardiovascular:  Negative for chest pain, palpitations and leg swelling.  Gastrointestinal:  Negative for abdominal pain.  Genitourinary:  Negative for dysuria and vaginal bleeding.  Musculoskeletal:  Negative for back pain.  Neurological:  Negative for syncope, light-headedness and headaches.   Psychiatric/Behavioral:  Negative for dysphoric mood.    Objective:  There were no vitals taken for this visit.  Wt Readings from Last 3 Encounters:  03/02/22 163 lb 2.3 oz (74 kg)  02/25/22 165 lb (74.8 kg)  01/22/22 163 lb 6 oz (74.1 kg)      Physical Exam Constitutional:      General: She is not in acute distress.    Appearance: Normal appearance. She is well-developed. She is not ill-appearing or toxic-appearing.  HENT:     Head: Normocephalic.     Right Ear: Hearing, tympanic membrane, ear canal and external ear normal. Tympanic membrane is not erythematous, retracted or bulging.     Left Ear: Hearing, tympanic membrane, ear canal and external ear normal. Tympanic membrane is not erythematous, retracted or bulging.     Nose: No mucosal edema or rhinorrhea.     Right Sinus: No maxillary sinus tenderness or frontal sinus tenderness.     Left Sinus: No maxillary sinus tenderness or frontal sinus tenderness.     Mouth/Throat:     Pharynx: Uvula midline.  Eyes:     General: Lids are normal. Lids are everted, no foreign bodies appreciated.     Conjunctiva/sclera: Conjunctivae normal.     Pupils: Pupils are equal, round, and reactive to light.  Neck:     Thyroid: No thyroid mass or thyromegaly.     Vascular: No carotid bruit.     Trachea: Trachea normal.  Cardiovascular:     Rate and Rhythm: Normal rate and regular rhythm.     Pulses: Normal pulses.     Heart sounds: Normal heart sounds, S1 normal and S2 normal. No murmur heard.    No friction rub. No gallop.  Pulmonary:     Effort: Pulmonary effort is normal. No tachypnea or respiratory distress.     Breath sounds: Normal breath sounds. No decreased breath sounds, wheezing, rhonchi or rales.  Abdominal:     General: Bowel sounds are normal.     Palpations: Abdomen is soft.     Tenderness: There is no abdominal tenderness.  Musculoskeletal:     Cervical back: Normal range of motion and neck supple.     Comments: Slight  swelling in right inferior antecubital fossa with contusion, healing Soreness over antecubital fossa, no cords palpated, no increase in warmth or redness.  Skin:  General: Skin is warm and dry.     Findings: No rash.  Neurological:     Mental Status: She is alert.  Psychiatric:        Mood and Affect: Mood is not anxious or depressed.        Speech: Speech normal.        Behavior: Behavior normal. Behavior is cooperative.        Thought Content: Thought content normal.        Judgment: Judgment normal.       Results for orders placed or performed during the hospital encounter of 02/28/22  Lipase, blood  Result Value Ref Range   Lipase 25 11 - 51 U/L  Comprehensive metabolic panel  Result Value Ref Range   Sodium 129 (L) 135 - 145 mmol/L   Potassium 3.2 (L) 3.5 - 5.1 mmol/L   Chloride 91 (L) 98 - 111 mmol/L   CO2 24 22 - 32 mmol/L   Glucose, Bld 121 (H) 70 - 99 mg/dL   BUN 11 8 - 23 mg/dL   Creatinine, Ser 0.60 0.44 - 1.00 mg/dL   Calcium 9.2 8.9 - 10.3 mg/dL   Total Protein 8.5 (H) 6.5 - 8.1 g/dL   Albumin 4.6 3.5 - 5.0 g/dL   AST 22 15 - 41 U/L   ALT 20 0 - 44 U/L   Alkaline Phosphatase 85 38 - 126 U/L   Total Bilirubin 1.3 (H) 0.3 - 1.2 mg/dL   GFR, Estimated >60 >60 mL/min   Anion gap 14 5 - 15  CBC  Result Value Ref Range   WBC 9.8 4.0 - 10.5 K/uL   RBC 5.53 (H) 3.87 - 5.11 MIL/uL   Hemoglobin 16.6 (H) 12.0 - 15.0 g/dL   HCT 47.2 (H) 36.0 - 46.0 %   MCV 85.4 80.0 - 100.0 fL   MCH 30.0 26.0 - 34.0 pg   MCHC 35.2 30.0 - 36.0 g/dL   RDW 12.4 11.5 - 15.5 %   Platelets 398 150 - 400 K/uL   nRBC 0.0 0.0 - 0.2 %  Urinalysis, Routine w reflex microscopic  Result Value Ref Range   Color, Urine YELLOW (A) YELLOW   APPearance HAZY (A) CLEAR   Specific Gravity, Urine 1.011 1.005 - 1.030   pH 6.0 5.0 - 8.0   Glucose, UA NEGATIVE NEGATIVE mg/dL   Hgb urine dipstick MODERATE (A) NEGATIVE   Bilirubin Urine NEGATIVE NEGATIVE   Ketones, ur 20 (A) NEGATIVE mg/dL    Protein, ur 100 (A) NEGATIVE mg/dL   Nitrite NEGATIVE NEGATIVE   Leukocytes,Ua NEGATIVE NEGATIVE   RBC / HPF 6-10 0 - 5 RBC/hpf   WBC, UA 21-50 0 - 5 WBC/hpf   Bacteria, UA MANY (A) NONE SEEN   Squamous Epithelial / LPF 6-10 0 - 5   Mucus PRESENT    Hyaline Casts, UA PRESENT   Comprehensive metabolic panel  Result Value Ref Range   Sodium 129 (L) 135 - 145 mmol/L   Potassium 2.6 (LL) 3.5 - 5.1 mmol/L   Chloride 98 98 - 111 mmol/L   CO2 25 22 - 32 mmol/L   Glucose, Bld 112 (H) 70 - 99 mg/dL   BUN 13 8 - 23 mg/dL   Creatinine, Ser 0.55 0.44 - 1.00 mg/dL   Calcium 8.1 (L) 8.9 - 10.3 mg/dL   Total Protein 6.3 (L) 6.5 - 8.1 g/dL   Albumin 3.4 (L) 3.5 - 5.0 g/dL   AST 15 15 - 41  U/L   ALT 15 0 - 44 U/L   Alkaline Phosphatase 66 38 - 126 U/L   Total Bilirubin 1.1 0.3 - 1.2 mg/dL   GFR, Estimated >60 >60 mL/min   Anion gap 6 5 - 15  CBC  Result Value Ref Range   WBC 11.1 (H) 4.0 - 10.5 K/uL   RBC 4.64 3.87 - 5.11 MIL/uL   Hemoglobin 13.9 12.0 - 15.0 g/dL   HCT 40.0 36.0 - 46.0 %   MCV 86.2 80.0 - 100.0 fL   MCH 30.0 26.0 - 34.0 pg   MCHC 34.8 30.0 - 36.0 g/dL   RDW 12.7 11.5 - 15.5 %   Platelets 338 150 - 400 K/uL   nRBC 0.0 0.0 - 0.2 %  Potassium  Result Value Ref Range   Potassium 3.5 3.5 - 5.1 mmol/L  Potassium  Result Value Ref Range   Potassium 3.4 (L) 3.5 - 5.1 mmol/L  Basic metabolic panel  Result Value Ref Range   Sodium 131 (L) 135 - 145 mmol/L   Potassium 3.8 3.5 - 5.1 mmol/L   Chloride 100 98 - 111 mmol/L   CO2 25 22 - 32 mmol/L   Glucose, Bld 91 70 - 99 mg/dL   BUN 8 8 - 23 mg/dL   Creatinine, Ser 0.66 0.44 - 1.00 mg/dL   Calcium 8.3 (L) 8.9 - 10.3 mg/dL   GFR, Estimated >60 >60 mL/min   Anion gap 6 5 - 15  Magnesium  Result Value Ref Range   Magnesium 2.2 1.7 - 2.4 mg/dL  Surgical pathology  Result Value Ref Range   SURGICAL PATHOLOGY      SURGICAL PATHOLOGY CASE: [id number] PATIENT: Tiffany Orozco Surgical Pathology Report     Specimen  Submitted: A. Stomach, random B. Duodenal ulcer; cbx C. Esophagus; cbx  Clinical History: Nausea/vomiting/dysphagia.  Esophagitis Grade D, duodenal ulcer clean base      DIAGNOSIS: A.  STOMACH, RANDOM; BIOPSY: - ANTRAL MUCOSA WITH MILD REACTIVE GASTRITIS. - OXYNTIC MUCOSA WITH CHANGES CONSISTENT WITH PROTON PUMP INHIBITOR USE. -NEGATIVE FOR H. PYLORI, INTESTINAL METAPLASIA, DYSPLASIA, AND MALIGNANCY.  B.  DUODENAL ULCER; COLD BIOPSY: - HEALING EROSIVE DUODENITIS. - NEGATIVE FOR DYSPLASIA AND MALIGNANCY.  C.  ESOPHAGUS; COLD BIOPSY: - FOCAL MILD ACUTE ESOPHAGITIS, FAVOR REFLUX ESOPHAGITIS. - PASF STAIN IS NEGATIVE. - NEGATIVE FOR INTRAEPITHELIAL EOSINOPHILS, DYSPLASIA, AND MALIGNANCY.  GROSS DESCRIPTION: A. Labeled: Random gastric rule out H. pylori Received: Formalin Collection time: 10:52 AM on 03/02/2022 Placed into formalin time: 10:52 AM on 10 /07/2021 Tissue fragment(s): 4 Size: Aggregate, 0.8 x 0.4 x 0.2 cm Description: Tan soft tissue fragments Entirely submitted in 1 cassette.  B. Labeled: cbx duodenal ulcer Received: Formalin Collection time: 11:25 AM on 03/02/2022 Placed into formalin time: 11:25 AM on 03/02/2022 Tissue fragment(s): 1 Size: 0.5 x 0.4 x 0.2 cm Description: Tan-pink soft tissue fragment Entirely submitted in 1 cassette.  C. Labeled: cbx esophagitis Received: Formalin Collection time: 11:27 AM on 03/02/2022 Placed into formalin time: 11:27 AM on 03/02/2022 Tissue fragment(s): 3 Size: Aggregate, 1 x 0.3 x 0.1 cm Description: White-pink translucent soft tissue fragments Entirely submitted in 1 cassette.  RB 03/02/2022  Final Diagnosis performed by Quay Burow, MD.   Electronically signed 03/04/2022 1:58:12PM The electronic signature indicates that the named Attending Pathologist has evaluated the specimen Technical component performed at [facility], [address] [place], [place] [address] Lab: [id number] Dir: Rush Farmer, MD, MMM   Professional component performed at [facility], [facility],  [place], [place] [address] Lab: [phone] Dir: Kathi Simpers, MD      COVID 19 screen:  No recent travel or known exposure to [place] The patient denies respiratory symptoms of COVID 19 at this time. The importance of social distancing was discussed today.   Assessment and Plan Problem List Items Addressed This Visit     Dizziness    Acute, will evaluate with labs for etiology such as anemia, electrolyte imbalance, change in thyroid function.  She is currently keeping up with hydration by pushing fluids.      Relevant Orders   Basic Metabolic Panel   CBC with Differential/Platelet   Esophagitis determined by biopsy   History of CVA (cerebrovascular accident)    Previously placed on Aggrenox for past stroke.  This medication was stopped upon hospitalization given possible stomach irritation.  She will discuss with GI whether low-dose enteric-coated aspirin 81 mg would be appropriate.      Hypothyroidism    Taking the proton pump inhibitor twice daily could be inhibiting absorption of Armour Thyroid.  We will reevaluate with TSH today.      Relevant Orders   TSH   PUD (peptic ulcer disease) - Primary    Now on pantoprazole 40 mg twice daily.  Has upcoming follow-up with GI Dr. Virgina Jock.      Schatzki's ring   Orders Placed This Encounter  Procedures   Basic Metabolic Panel   TSH   CBC with Differential/Platelet        [name], MD

## 2022-03-16 DIAGNOSIS — L570 Actinic keratosis: Secondary | ICD-10-CM | POA: Diagnosis not present

## 2022-03-16 DIAGNOSIS — L820 Inflamed seborrheic keratosis: Secondary | ICD-10-CM | POA: Diagnosis not present

## 2022-03-16 DIAGNOSIS — L821 Other seborrheic keratosis: Secondary | ICD-10-CM | POA: Diagnosis not present

## 2022-03-16 DIAGNOSIS — L814 Other melanin hyperpigmentation: Secondary | ICD-10-CM | POA: Diagnosis not present

## 2022-03-30 ENCOUNTER — Other Ambulatory Visit: Payer: Self-pay | Admitting: *Deleted

## 2022-03-30 ENCOUNTER — Other Ambulatory Visit: Payer: Self-pay | Admitting: Family Medicine

## 2022-03-30 NOTE — Telephone Encounter (Signed)
Not on current medication list.  Refill?

## 2022-03-31 MED ORDER — ALBUTEROL SULFATE HFA 108 (90 BASE) MCG/ACT IN AERS: INHALATION_SPRAY | RESPIRATORY_TRACT | 11 refills | Status: DC

## 2022-03-31 NOTE — Addendum Note (Signed)
Addended by: Carter Kitten on: 03/31/2022 02:03 PM   Modules accepted: Orders

## 2022-04-06 DIAGNOSIS — E661 Drug-induced obesity: Secondary | ICD-10-CM | POA: Diagnosis not present

## 2022-04-06 DIAGNOSIS — Z1211 Encounter for screening for malignant neoplasm of colon: Secondary | ICD-10-CM | POA: Diagnosis not present

## 2022-04-06 DIAGNOSIS — K2210 Ulcer of esophagus without bleeding: Secondary | ICD-10-CM | POA: Diagnosis not present

## 2022-04-06 DIAGNOSIS — Z6831 Body mass index (BMI) 31.0-31.9, adult: Secondary | ICD-10-CM | POA: Diagnosis not present

## 2022-04-06 DIAGNOSIS — K269 Duodenal ulcer, unspecified as acute or chronic, without hemorrhage or perforation: Secondary | ICD-10-CM | POA: Diagnosis not present

## 2022-04-14 ENCOUNTER — Ambulatory Visit (INDEPENDENT_AMBULATORY_CARE_PROVIDER_SITE_OTHER): Payer: Medicare Other

## 2022-04-14 VITALS — Ht 59.0 in | Wt 162.0 lb

## 2022-04-14 DIAGNOSIS — Z0000 Encounter for general adult medical examination without abnormal findings: Secondary | ICD-10-CM

## 2022-04-14 NOTE — Patient Instructions (Signed)
Tiffany Orozco , Thank you for taking time to come for your Medicare Wellness Visit. I appreciate your ongoing commitment to your health goals. Please review the following plan we discussed and let me know if I can assist you in the future.   Screening recommendations/referrals: Colonoscopy: FOBT 03/25/20, DUE Mammogram: 02/15/22 Bone Density: 12/18/18, EVERY 5 YEARS Recommended yearly ophthalmology/optometry visit for glaucoma screening and checkup Recommended yearly dental visit for hygiene and checkup  Vaccinations: Influenza vaccine: 04/10/21 Pneumococcal vaccine: 10/15/14 Tdap vaccine: 03/06/09, DUE IF HAVE INJURY Shingles vaccine: ZOSTAVAX 05/18/11   Covid-19:07/27/19, 08/21/19  Advanced directives: NO  Conditions/risks identified: NONE  Next appointment: Follow up in one year for your annual wellness visit 04/18/23 @ 9 AM BY PHONE   Preventive Care 65 Years and Older, Female Preventive care refers to lifestyle choices and visits with your health care provider that can promote health and wellness. What does preventive care include? A yearly physical exam. This is also called an annual well check. Dental exams once or twice a year. Routine eye exams. Ask your health care provider how often you should have your eyes checked. Personal lifestyle choices, including: Daily care of your teeth and gums. Regular physical activity. Eating a healthy diet. Avoiding tobacco and drug use. Limiting alcohol use. Practicing safe sex. Taking low-dose aspirin every day. Taking vitamin and mineral supplements as recommended by your health care provider. What happens during an annual well check? The services and screenings done by your health care provider during your annual well check will depend on your age, overall health, lifestyle risk factors, and family history of disease. Counseling  Your health care provider may ask you questions about your: Alcohol use. Tobacco use. Drug use. Emotional  well-being. Home and relationship well-being. Sexual activity. Eating habits. History of falls. Memory and ability to understand (cognition). Work and work Statistician. Reproductive health. Screening  You may have the following tests or measurements: Height, weight, and BMI. Blood pressure. Lipid and cholesterol levels. These may be checked every 5 years, or more frequently if you are over 37 years old. Skin check. Lung cancer screening. You may have this screening every year starting at age 81 if you have a 30-pack-year history of smoking and currently smoke or have quit within the past 15 years. Fecal occult blood test (FOBT) of the stool. You may have this test every year starting at age 22. Flexible sigmoidoscopy or colonoscopy. You may have a sigmoidoscopy every 5 years or a colonoscopy every 10 years starting at age 86. Hepatitis C blood test. Hepatitis B blood test. Sexually transmitted disease (STD) testing. Diabetes screening. This is done by checking your blood sugar (glucose) after you have not eaten for a while (fasting). You may have this done every 1-3 years. Bone density scan. This is done to screen for osteoporosis. You may have this done starting at age 63. Mammogram. This may be done every 1-2 years. Talk to your health care provider about how often you should have regular mammograms. Talk with your health care provider about your test results, treatment options, and if necessary, the need for more tests. Vaccines  Your health care provider may recommend certain vaccines, such as: Influenza vaccine. This is recommended every year. Tetanus, diphtheria, and acellular pertussis (Tdap, Td) vaccine. You may need a Td booster every 10 years. Zoster vaccine. You may need this after age 69. Pneumococcal 13-valent conjugate (PCV13) vaccine. One dose is recommended after age 55. Pneumococcal polysaccharide (PPSV23) vaccine. One dose is  recommended after age 4. Talk to your  health care provider about which screenings and vaccines you need and how often you need them. This information is not intended to replace advice given to you by your health care provider. Make sure you discuss any questions you have with your health care provider. Document Released: 06/13/2015 Document Revised: 02/04/2016 Document Reviewed: 03/18/2015 Elsevier Interactive Patient Education  2017 [place] Prevention in the Home Falls can cause injuries. They can happen to people of all ages. There are many things you can do to make your home safe and to help prevent falls. What can I do on the outside of my home? Regularly fix the edges of walkways and driveways and fix any cracks. Remove anything that might make you trip as you walk through a door, such as a raised step or threshold. Trim any bushes or trees on the path to your home. Use bright outdoor lighting. Clear any walking paths of anything that might make someone trip, such as rocks or tools. Regularly check to see if handrails are loose or broken. Make sure that both sides of any steps have handrails. Any raised decks and porches should have guardrails on the edges. Have any leaves, snow, or ice cleared regularly. Use sand or salt on walking paths during winter. Clean up any spills in your garage right away. This includes oil or grease spills. What can I do in the bathroom? Use night lights. Install grab bars by the toilet and in the tub and shower. Do not use towel bars as grab bars. Use non-skid mats or decals in the tub or shower. If you need to sit down in the shower, use a plastic, non-slip stool. Keep the floor dry. Clean up any water that spills on the floor as soon as it happens. Remove soap buildup in the tub or shower regularly. Attach bath mats securely with double-sided non-slip rug tape. Do not have throw rugs and other things on the floor that can make you trip. What can I do in the bedroom? Use night  lights. Make sure that you have a light by your bed that is easy to reach. Do not use any sheets or blankets that are too big for your bed. They should not hang down onto the floor. Have a firm chair that has side arms. You can use this for support while you get dressed. Do not have throw rugs and other things on the floor that can make you trip. What can I do in the kitchen? Clean up any spills right away. Avoid walking on wet floors. Keep items that you use a lot in easy-to-reach places. If you need to reach something above you, use a strong step stool that has a grab bar. Keep electrical cords out of the way. Do not use floor polish or wax that makes floors slippery. If you must use wax, use non-skid floor wax. Do not have throw rugs and other things on the floor that can make you trip. What can I do with my stairs? Do not leave any items on the stairs. Make sure that there are handrails on both sides of the stairs and use them. Fix handrails that are broken or loose. Make sure that handrails are as long as the stairways. Check any carpeting to make sure that it is firmly attached to the stairs. Fix any carpet that is loose or worn. Avoid having throw rugs at the top or bottom of the stairs. If  you do have throw rugs, attach them to the floor with carpet tape. Make sure that you have a light switch at the top of the stairs and the bottom of the stairs. If you do not have them, ask someone to add them for you. What else can I do to help prevent falls? Wear shoes that: Do not have high heels. Have rubber bottoms. Are comfortable and fit you well. Are closed at the toe. Do not wear sandals. If you use a stepladder: Make sure that it is fully opened. Do not climb a closed stepladder. Make sure that both sides of the stepladder are locked into place. Ask someone to hold it for you, if possible. Clearly mark and make sure that you can see: Any grab bars or handrails. First and last  steps. Where the edge of each step is. Use tools that help you move around (mobility aids) if they are needed. These include: Canes. Walkers. Scooters. Crutches. Turn on the lights when you go into a dark area. Replace any light bulbs as soon as they burn out. Set up your furniture so you have a clear path. Avoid moving your furniture around. If any of your floors are uneven, fix them. If there are any pets around you, be aware of where they are. Review your medicines with your doctor. Some medicines can make you feel dizzy. This can increase your chance of falling. Ask your doctor what other things that you can do to help prevent falls. This information is not intended to replace advice given to you by your health care provider. Make sure you discuss any questions you have with your health care provider. Document Released: 03/13/2009 Document Revised: 10/23/2015 Document Reviewed: 06/21/2014 Elsevier Interactive Patient Education  2017 [employer].

## 2022-04-14 NOTE — Progress Notes (Signed)
Virtual Visit via Telephone Note  I connected with  [name] on 04/14/22 at  9:00 AM EST by telephone and verified that I am speaking with the correct person using two identifiers.  Location: Patient: HOME Provider: [place] participating in the virtual visit: [place]   I discussed the limitations, risks, security and privacy concerns of performing an evaluation and management service by telephone and the availability of in person appointments. The patient expressed understanding and agreed to proceed.  Interactive audio and video telecommunications were attempted between this nurse and patient, however failed, due to patient having technical difficulties OR patient did not have access to video capability.  We continued and completed visit with audio only.  Some vital signs may be absent or patient reported.   Dionisio David, LPN  Subjective:   HAZELL SIWIK is a 71 y.o. female who presents for Medicare Annual (Subsequent) preventive examination.  Review of Systems     Cardiac Risk Factors include: advanced age (>19mn, >>76women);hypertension;dyslipidemia     Objective:    There were no vitals filed for this visit. There is no height or weight on file to calculate BMI.     04/14/2022    9:05 AM 03/01/2022   12:26 AM 02/28/2022    6:55 PM 02/25/2022    6:02 PM 12/22/2021    9:41 AM 04/09/2021    9:50 AM 01/08/2021   11:31 PM  Advanced Directives  Does Patient Have a Medical Advance Directive? No No No No No No   Would patient like information on creating a medical advance directive? No - Patient declined Yes (Inpatient - patient requests chaplain consult to create a medical advance directive) No - Patient declined No - Patient declined  Yes (MAU/Ambulatory/Procedural Areas - Information given) No - Patient declined    Current Medications (verified) Outpatient Encounter Medications as of 04/14/2022  Medication Sig   albuterol  (VENTOLIN HFA) 108 (90 Base) MCG/ACT inhaler INHALE 2 PUFFS BY MOUTH EVERY 6 HOURS AS NEEDED FOR WHEEZE OR SHORTNESS OF BREATH   ARMOUR THYROID 90 MG tablet Take 1 tablet every other day alternating with 60 mg.   Cholecalciferol (VITAMIN D3) 125 MCG (5000 UT) TABS Take 5,000 Units by mouth daily.   ezetimibe (ZETIA) 10 MG tablet Take 1 tablet (10 mg total) by mouth daily.   fluocinonide (LIDEX) 0.05 % external solution APPLY TO SCALP AS DIRECTED AS NEEDED   fluticasone (FLONASE) 50 MCG/ACT nasal spray SPRAY 2 SPRAYS INTO EACH NOSTRIL EVERY DAY   ketoconazole (NIZORAL) 2 % shampoo APPLY 1 APPLICATION TOPICALLY 2 (TWO) TIMES A WEEK.   pantoprazole (PROTONIX) 40 MG tablet Take 1 tablet (40 mg total) by mouth 2 (two) times daily.   sucralfate (CARAFATE) 1 g tablet Take 1 tablet (1 g total) by mouth 3 (three) times daily as needed.   thyroid (ARMOUR THYROID) 60 MG tablet Take 1 tablet every other day alternating with 90 mg.   triamcinolone cream (KENALOG) 0.1 % APPLY A SMALL AMOUNT TO AFFECTED AREA TWICE A DAY AS NEED FOR ITCHING/INFLAMMATION.   [DISCONTINUED] albuterol (VENTOLIN HFA) 108 (90 Base) MCG/ACT inhaler Inhale into the lungs.   dipyridamole-aspirin (AGGRENOX) 200-25 MG 12hr capsule Take by mouth. (Patient not taking: Reported on 04/14/2022)   fluorometholone (FLAREX) 0.1 % ophthalmic suspension [facility] TIM[facility] DAILY FOR TWO WEEKS THEN STOP (Patient not taking: Reported on 04/14/2022)   Na Sulfate-K Sulfate-Mg Sulf 17.5-3.13-1.6 GM/177ML  SOLN Take by mouth. (Patient not taking: Reported on 04/14/2022)   nicotine (NICODERM CQ - DOSED IN MG/24 HOURS) 21 mg/24hr patch Place 1 patch (21 mg total) onto the skin daily. (Patient not taking: Reported on 03/10/2022)   ondansetron (ZOFRAN) 4 MG tablet Take 4 mg by mouth daily. (Patient not taking: Reported on 04/14/2022)   No facility-administered encounter medications on file as of 04/14/2022.    Allergies  (verified) Lisinopril, Niacin, Welchol [colesevelam hcl], Augmentin [amoxicillin-pot clavulanate], Codeine, and Oxycodone hcl   History: Past Medical History:  Diagnosis Date   Allergy    Anxiety    COPD (chronic obstructive pulmonary disease) (HCC)    DDD (degenerative disc disease), lumbar    mild   Depression    Female rectocele without uterine prolapse    GERD (gastroesophageal reflux disease)    Hearing loss    Heart murmur    Hyperlipidemia    Hypertension    Hypothyroidism    IBS (irritable bowel syndrome)    Influenza A 06/2015   Obesity    Osteopenia    Plantar fasciitis    Pneumonia    Skin cancer of arm    Skin cancer of face    Stroke ([place]) 07/2010   no permenant deficits   Thrombocytopenia ([place])    Tubal pregnancy    Past Surgical History:  Procedure Laterality Date   APPENDECTOMY     CHOLECYSTECTOMY  2004   COLONOSCOPY     CYST EXCISION Right    breast mole   EMGs  7/04   atms and wrists neg   ESOPHAGEAL MANOMETRY N/A 03/17/2015   Procedure: ESOPHAGEAL MANOMETRY (EM);  Surgeon: Manus Gunning, MD;  Location: [facility] END[facility]COPY;  Service: Gastroenterology;  Laterality: N/A;   ESOPHAGOGASTRODUODENOSCOPY N/A 03/02/2022   Procedure: ESOPHAGOGASTRODUODENOSCOPY (EGD);  Surgeon: Annamaria Helling, DO;  Location: [facility] ENDOSCOPY;  Service: Gastroenterology;  Laterality: N/A;   FOOT SURGERY Right 2008, 2009, 2010   torn ligament, hammertoes   TUBAL LIGATION  1975   UPPER GI ENDOSCOPY     XI ROBOTIC ASSISTED VENTRAL HERNIA N/A 04/14/2020   Procedure: XI ROBOTIC ASSISTED VENTRAL HERNIA;  Surgeon: Fredirick Maudlin, MD;  Location: [facility] ORS;  Service: General;  Laterality: N/A;   Family History  Problem Relation Age of Onset   Skin cancer Father    Hypertension Mother    Lung cancer Brother    Colon cancer Sister    Esophageal cancer Sister    Ovarian cancer Sister    Stomach cancer Neg Hx    Pancreatic cancer Neg Hx    Social History   Socioeconomic  History   Marital status: Legally Separated    Spouse name: Delcenia Inman   Number of children: 2   Years of education: Not on file   Highest education level: Not on file  Occupational History   Occupation: bookkeeper    Fish farm manager: UNEMPLOYED    Comment: 10/10 not working x 9 years  Tobacco Use   Smoking status: Every Day    Packs/day: 0.75    Years: 31.00    Total pack years: 23.25    Types: Cigarettes   Smokeless tobacco: Never   Tobacco comments:    States she is not smoking during current illness 05/29/18 sb  Vaping Use   Vaping Use: Never used  Substance and Sexual Activity   Alcohol use: No    Alcohol/week: 0.0 standard drinks of alcohol   Drug use: No   Sexual activity:  Yes    Birth control/protection: Post-menopausal  Other Topics Concern   Not on file  Social History Narrative   49/10 husband had open heart surgery, not working, 2 grandchildren, siblings living and healthy   Social Determinants of Health   Financial Resource Strain: Low Risk  (04/14/2022)   Overall Financial Resource Strain (CARDIA)    Difficulty of Paying Living Expenses: Not hard at all  Food Insecurity: No Food Insecurity (04/14/2022)   Hunger Vital Sign    Worried About Running Out of Food in the Last Year: Never true    Ran Out of Food in the Last Year: Never true  Transportation Needs: No Transportation Needs (04/14/2022)   PRAPARE - Hydrologist (Medical): No    Lack of Transportation (Non-Medical): No  Physical Activity: Sufficiently Active (04/14/2022)   Exercise Vital Sign    Days of Exercise per Week: 5 days    Minutes of Exercise per Session: 30 min  Stress: No Stress Concern Present (04/14/2022)   [place]    Feeling of Stress : Only a little  Social Connections: Socially Isolated (04/14/2022)   Social Connection and Isolation Panel [NHANES]    Frequency of Communication with  Friends and Family: More than three times a week    Frequency of Social Gatherings with Friends and Family: Once a week    Attends Religious Services: Never    Marine scientist or Organizations: No    Attends Music therapist: Never    Marital Status: Separated    Tobacco Counseling Ready to quit: Not Answered Counseling given: Not Answered Tobacco comments: States she is not smoking during current illness 05/29/18 sb   Clinical Intake:  Pre-visit preparation completed: Yes  Pain : No/denies pain     Nutritional Risks: None Diabetes: No  How often do you need to have someone help you when you read instructions, pamphlets, or other written materials from your doctor or pharmacy?: 1 - Never  Diabetic?NO  Interpreter Needed?: No  Information entered by :: Kirke Shaggy, LPN   Activities of Daily Living    04/14/2022    9:06 AM 03/01/2022   12:26 AM  In your present state of health, do you have any difficulty performing the following activities:  Hearing? 0 0  Vision? 0 0  Difficulty concentrating or making decisions? 0 0  Walking or climbing stairs? 1 0  Dressing or bathing? 0 0  Doing errands, shopping? 0 1  Preparing Food and eating ? N   Using the Toilet? N   In the past six months, have you accidently leaked urine? N   Do you have problems with loss of bowel control? N   Managing your Medications? N   Managing your Finances? N   Housekeeping or managing your Housekeeping? N     Patient Care Team: Jinny Sanders, MD as PCP - General  Indicate any recent Medical Services you may have received from other than [facility] providers in the past year (date may be approximate).     Assessment:   This is a routine wellness examination for Rowena.  Hearing/Vision screen Hearing Screening - Comments:: NO AIDS Vision Screening - Comments:: WEARS GLASSES- [facility]  Dietary issues and exercise activities discussed: Current Exercise Habits: Home  exercise routine, Type of exercise: walking, Time (Minutes): 30, Frequency (Times/Week): 5, Weekly Exercise (Minutes/Week): 150, Intensity: Mild   Goals Addressed  This Visit's Progress    DIET - EAT MORE FRUITS AND VEGETABLES         Depression Screen    04/14/2022    9:03 AM 08/27/2021    3:25 PM 04/09/2021   10:01 AM 02/29/2020   11:41 AM 12/26/2018   11:22 AM 11/07/2018    9:03 AM 12/14/2017   10:25 AM  PHQ 2/9 Scores  PHQ - 2 Score 0 4 0 2 0 0 1  PHQ- 9 Score 0 '14  4  1 5    '$ Fall Risk    04/14/2022    9:06 AM 04/09/2021   10:00 AM 03/18/2020    9:59 AM 02/29/2020    9:54 AM 04/25/2019    9:43 AM  Fall Risk   Falls in the past year? 0 0 0 1 0  Comment     Emmi Telephone Survey: data to providers prior to load  Number falls in past yr: 0 0 0 0   Injury with Fall? 0 0 0 0   Risk for fall due to : No Fall Risks No Fall Risks     Follow up Falls prevention discussed;Falls evaluation completed Falls prevention discussed       FALL RISK PREVENTION PERTAINING TO THE HOME:  Any stairs in or around the home? No  If so, are there any without handrails? No  Home free of loose throw rugs in walkways, pet beds, electrical cords, etc? Yes  Adequate lighting in your home to reduce risk of falls? Yes   ASSISTIVE DEVICES UTILIZED TO PREVENT FALLS:  Life alert? No  Use of a cane, walker or w/c? Yes  Grab bars in the bathroom? No  Shower chair or bench in shower? Yes  Elevated toilet seat or a handicapped toilet? No     Cognitive Function:    12/14/2017   10:25 AM 11/03/2016    1:06 PM  MMSE - Mini Mental State Exam  Orientation to time 5 5  Orientation to Place 5 5  Registration 3 3  Attention/ Calculation 0 0  Recall 3 2  Recall-comments  pt was unable to recall 1 of 3 words  Language- name 2 objects 0 0  Language- repeat 1 1  Language- follow 3 step command 3 3  Language- read & follow direction 0 0  Write a sentence 0 0  Copy design 0 0  Total  score 20 19        04/14/2022    9:06 AM  6CIT Screen  What Year? 0 points  What month? 0 points  What time? 0 points  Count back from 20 0 points  Months in reverse 0 points  Repeat phrase 0 points  Total Score 0 points    Immunizations Immunization History  Administered Date(s) Administered   Fluad Quad(high Dose 65+) 03/27/2019, 04/10/2021   Influenza Split 02/18/2011, 03/03/2012   Influenza Whole 03/06/2007, 03/11/2008, 03/06/2009, 02/24/2010   Influenza, High Dose Seasonal PF 03/16/2018   Influenza,inj,Quad PF,6+ Mos 02/16/2013, 03/22/2014, 02/25/2015, 04/29/2016, 03/03/2017   PFIZER(Purple Top)SARS-COV-2 Vaccination 07/27/2019, 08/21/2019   Pneumococcal Conjugate-13 10/15/2014   Pneumococcal Polysaccharide-23 10/16/2013   Td 03/11/1997, 03/06/2009   Zoster, Live 05/18/2011    TDAP status: Due, Education has been provided regarding the importance of this vaccine. Advised may receive this vaccine at local pharmacy or Health Dept. Aware to provide a copy of the vaccination record if obtained from local pharmacy or Health Dept. Verbalized acceptance and understanding.  Flu Vaccine status:  Due, Education has been provided regarding the importance of this vaccine. Advised may receive this vaccine at local pharmacy or Health Dept. Aware to provide a copy of the vaccination record if obtained from local pharmacy or Health Dept. Verbalized acceptance and understanding.  Pneumococcal vaccine status: Up to date  Covid-19 vaccine status: Completed vaccines  Qualifies for Shingles Vaccine? Yes   Zostavax completed Yes   Shingrix Completed?: No.    Education has been provided regarding the importance of this vaccine. Patient has been advised to call insurance company to determine out of pocket expense if they have not yet received this vaccine. Advised may also receive vaccine at local pharmacy or Health Dept. Verbalized acceptance and understanding.  Screening Tests Health  Maintenance  Topic Date Due   Zoster Vaccines- Shingrix (1 of 2) Never done   TETANUS/TDAP  03/07/2019   COVID-19 Vaccine (3 - Pfizer series) 10/16/2019   COLONOSCOPY (Pts 45-42yr Insurance coverage will need to be confirmed)  05/05/2020   INFLUENZA VACCINE  08/29/2022 (Originally 12/29/2021)   Lung Cancer Screening  02/06/2023   MAMMOGRAM  02/16/2023   Medicare Annual Wellness (AWV)  04/15/2023   Pneumonia Vaccine 71 Years old  Completed   DEXA SCAN  Completed   Hepatitis C Screening  Completed   HPV VACCINES  Aged Out    Health Maintenance  Health Maintenance Due  Topic Date Due   Zoster Vaccines- Shingrix (1 of 2) Never done   TETANUS/TDAP  03/07/2019   COVID-19 Vaccine (3 - Pfizer series) 10/16/2019   COLONOSCOPY (Pts 45-456yrInsurance coverage will need to be confirmed)  05/05/2020    Colorectal cancer screening: Type of screening: FOBT/FIT. Completed 03/25/20. Repeat every 1 years  Mammogram status: Completed 02/15/22. Repeat every year  Bone Density status: Completed 12/18/18. Results reflect: Bone density results: OSTEOPENIA. Repeat every 5 years.  Lung Cancer Screening: (Low Dose CT Chest recommended if Age 250-80ears, 30 pack-year currently smoking OR have quit w/in 15years.) does qualify.   Lung Cancer Screening Referral: ORDERED 02/08/22  Additional Screening:  Hepatitis C Screening: does qualify; Completed 04/15/15  Vision Screening: Recommended annual ophthalmology exams for early detection of glaucoma and other disorders of the eye. Is the patient up to date with their annual eye exam?  Yes  Who is the provider or what is the name of the office in which the patient attends annual eye exams? [facility] If pt is not established with a provider, would they like to be referred to a provider to establish care? No .   Dental Screening: Recommended annual dental exams for proper oral hygiene  Community Resource Referral / Chronic Care Management: CRR required  this visit?  No   CCM required this visit?  No      Plan:     I have personally reviewed and noted the following in the patient's chart:   Medical and social history Use of alcohol, tobacco or illicit drugs  Current medications and supplements including opioid prescriptions. Patient is not currently taking opioid prescriptions. Functional ability and status Nutritional status Physical activity Advanced directives List of other physicians Hospitalizations, surgeries, and ER visits in previous 12 months Vitals Screenings to include cognitive, depression, and falls Referrals and appointments  In addition, I have reviewed and discussed with patient certain preventive protocols, quality metrics, and best practice recommendations. A written personalized care plan for preventive services as well as general preventive health recommendations were provided to patient.     Ruthie Berch S  Drema [place], LPN   [id number]   Nurse Notes: NONE

## 2022-05-05 NOTE — H&P (Signed)
Pre-Procedure H&P   Patient ID: Tiffany Orozco is a 71 y.o. female.  Gastroenterology Provider: Annamaria Helling, DO  PCP: Jinny Sanders, MD  Date: 05/06/2022  HPI Tiffany Orozco is a 71 y.o. female who presents today for Esophagogastroduodenoscopy for Esophagitis follow-up, to, IBS, screening colonoscopy family history colon cancer .  Patient with longstanding history of IBS and chronic nausea and vomiting.  She was seen in the hospital and underwent EGD demonstrated LA grade D esophagitis and duodenal ulcer.  She was placed on twice a day Protonix since then.  Her Aggrenox was discontinued.  She was recommended to discontinue smoking.  EGD in October also demonstrated Schatzki's ring.  Biopsies negative for H. pylori.  Since being on Protonix and being seen in the office she reports that her dysphagia has improved and she has no odynophagia. No longer with n/v. Bowel movements have returned to normal 1 no longer has any signs of melena.  Denies hematochezia.  Most recent hemoglobin 15.1 MCV 92 platelets 277,000 creatinine 0.63 sodium 129.  Family history for esophageal and colorectal cancer in her sister  Patient's last colonoscopy 2011 reportedly normal; scheduled for colonoscopy 07/2022  Past Medical History:  Diagnosis Date   Allergy    Anxiety    COPD (chronic obstructive pulmonary disease) ([place])    DDD (degenerative disc disease), lumbar    mild   Depression    Female rectocele without uterine prolapse    GERD (gastroesophageal reflux disease)    Hearing loss    Heart murmur    Hyperlipidemia    Hypertension    Hypothyroidism    IBS (irritable bowel syndrome)    Influenza A 06/2015   Intractable nausea and vomiting 03/01/2022   Obesity    Osteopenia    Plantar fasciitis    Pneumonia    Skin cancer of arm    Skin cancer of face    Stroke ([place]) 07/2010   no permenant deficits   Thrombocytopenia ([place])    Tubal pregnancy     Past Surgical History:   Procedure Laterality Date   APPENDECTOMY     CHOLECYSTECTOMY  2004   COLONOSCOPY     CYST EXCISION Right    breast mole   EMGs  11/2002   atms and wrists neg   ESOPHAGEAL MANOMETRY N/A 03/17/2015   Procedure: ESOPHAGEAL MANOMETRY (EM);  Surgeon: Manus Gunning, MD;  Location: [facility];  Service: Gastroenterology;  Laterality: N/A;   ESOPHAGOGASTRODUODENOSCOPY N/A 03/02/2022   Procedure: ESOPHAGOGASTRODUODENOSCOPY (EGD);  Surgeon: Annamaria Helling, DO;  Location: [facility] ENDOSCOPY;  Service: Gastroenterology;  Laterality: N/A;   FOOT SURGERY Right 2008, 2009, 2010   torn ligament, hammertoes   HERNIA REPAIR     TUBAL LIGATION  1975   UPPER GI ENDOSCOPY     XI ROBOTIC ASSISTED VENTRAL HERNIA N/A 04/14/2020   Procedure: XI ROBOTIC ASSISTED VENTRAL HERNIA;  Surgeon: Fredirick Maudlin, MD;  Location: [facility] ORS;  Service: General;  Laterality: N/A;    Family History Sister-colorectal cancer and esophageal cancer No other h/o GI disease or malignancy  Review of Systems  Constitutional:  Negative for activity change, appetite change, chills, diaphoresis, fatigue, fever and unexpected weight change.  HENT:  Negative for trouble swallowing and voice change.   Respiratory:  Negative for shortness of breath and wheezing.   Cardiovascular:  Negative for chest pain, palpitations and leg swelling.  Gastrointestinal:  Negative for abdominal distention, abdominal pain, anal bleeding, blood in  stool, constipation, diarrhea, nausea, rectal pain and vomiting.  Musculoskeletal:  Negative for arthralgias and myalgias.  Skin:  Negative for color change and pallor.  Neurological:  Negative for dizziness, syncope and weakness.  Psychiatric/Behavioral:  Negative for confusion.   All other systems reviewed and are negative.    Medications No current facility-administered medications on file prior to encounter.   Current Outpatient Medications on File Prior to Encounter  Medication Sig  Dispense Refill   ARMOUR THYROID 90 MG tablet Take 1 tablet every other day alternating with 60 mg. 45 tablet 3   Cholecalciferol (VITAMIN D3) 125 MCG (5000 UT) TABS Take 5,000 Units by mouth daily.     ezetimibe (ZETIA) 10 MG tablet Take 1 tablet (10 mg total) by mouth daily. 90 tablet 3   fluocinonide (LIDEX) 0.05 % external solution APPLY TO SCALP AS DIRECTED AS NEEDED     ketoconazole (NIZORAL) 2 % shampoo APPLY 1 APPLICATION TOPICALLY 2 (TWO) TIMES A WEEK.     pantoprazole (PROTONIX) 40 MG tablet Take 1 tablet (40 mg total) by mouth 2 (two) times daily. 90 tablet 3   thyroid (ARMOUR THYROID) 60 MG tablet Take 1 tablet every other day alternating with 90 mg. 45 tablet 3   triamcinolone cream (KENALOG) 0.1 % APPLY A SMALL AMOUNT TO AFFECTED AREA TWICE A DAY AS NEED FOR ITCHING/INFLAMMATION.     albuterol (VENTOLIN HFA) 108 (90 Base) MCG/ACT inhaler INHALE 2 PUFFS BY MOUTH EVERY 6 HOURS AS NEEDED FOR WHEEZE OR SHORTNESS OF BREATH 18 each 11   dipyridamole-aspirin (AGGRENOX) 200-25 MG 12hr capsule Take by mouth. (Patient not taking: Reported on 04/14/2022)     fluorometholone (FLAREX) 0.1 % ophthalmic suspension Instill ONE drop IN [facility] EYE FOUR TIMES DAILY FOR TWO WEEKS THEN STOP (Patient not taking: Reported on 05/06/2022)     fluticasone (FLONASE) 50 MCG/ACT nasal spray SPRAY 2 SPRAYS INTO EACH NOSTRIL EVERY DAY 48 mL 1   Na Sulfate-K Sulfate-Mg Sulf 17.5-3.13-1.6 GM/177ML SOLN Take by mouth. (Patient not taking: Reported on 04/14/2022)     nicotine (NICODERM CQ - DOSED IN MG/24 HOURS) 21 mg/24hr patch Place 1 patch (21 mg total) onto the skin daily. (Patient not taking: Reported on 03/10/2022) 28 patch 0   ondansetron (ZOFRAN) 4 MG tablet Take 4 mg by mouth daily. (Patient not taking: Reported on 04/14/2022)     sucralfate (CARAFATE) 1 g tablet Take 1 tablet (1 g total) by mouth 3 (three) times daily as needed. 90 tablet 3    Pertinent medications related to GI and procedure were reviewed by  me with the patient prior to the procedure   Current Facility-Administered Medications:    0.9 %  sodium chloride infusion, , Intravenous, Continuous, Annamaria Helling, DO, Last Rate: 20 mL/hr at 05/06/22 1205, 1,000 mL at 05/06/22 1205      Allergies  Allergen Reactions   Lisinopril Shortness Of Breath   Niacin Other (See Comments)     dizziness   Welchol [Colesevelam Hcl] Diarrhea, Nausea Only, Swelling and Other (See Comments)     swelling in face   Augmentin [Amoxicillin-Pot Clavulanate] Nausea Only    GI upset with taking.   Codeine     keeps patient awake   Oxycodone Hcl     Patient states this medication makes her hyper   Allergies were reviewed by me prior to the procedure  Objective   Body mass index is 27.51 kg/m. Vitals:   05/06/22 1153  BP: (!) 150/75  Pulse: 75  Resp: 18  Temp: (!) 97.4 F (36.3 C)  TempSrc: Temporal  SpO2: 99%  Weight: 75 kg  Height: '5\' 5"'$  (1.651 m)     Physical Exam Vitals and nursing note reviewed.  Constitutional:      General: She is not in acute distress.    Appearance: Normal appearance. She is not ill-appearing, toxic-appearing or diaphoretic.  HENT:     Head: Normocephalic and atraumatic.     Nose: Nose normal.     Mouth/Throat:     Mouth: Mucous membranes are moist.     Pharynx: Oropharynx is clear.  Eyes:     General: No scleral icterus.    Extraocular Movements: Extraocular movements intact.  Cardiovascular:     Rate and Rhythm: Normal rate and regular rhythm.     Heart sounds: Normal heart sounds. No murmur heard.    No friction rub. No gallop.  Pulmonary:     Effort: Pulmonary effort is normal. No respiratory distress.     Breath sounds: Normal breath sounds. No wheezing, rhonchi or rales.  Abdominal:     General: Bowel sounds are normal. There is no distension.     Palpations: Abdomen is soft.     Tenderness: There is no abdominal tenderness. There is no guarding or rebound.  Musculoskeletal:      Cervical back: Neck supple.     Right lower leg: No edema.     Left lower leg: No edema.  Skin:    General: Skin is warm and dry.     Coloration: Skin is not jaundiced or pale.  Neurological:     General: No focal deficit present.     Mental Status: She is alert and oriented to person, place, and time. Mental status is at baseline.  Psychiatric:        Mood and Affect: Mood normal.        Behavior: Behavior normal.        Thought Content: Thought content normal.        Judgment: Judgment normal.      Assessment:  Tiffany Orozco is a 71 y.o. female  who presents today for Esophagogastroduodenoscopy for Esophagitis follow-up, to, IBS, screening colonoscopy family history colon cancer .  Plan:  Esophagogastroduodenoscopy with possible intervention today  Esophagogastroduodenoscopy with possible biopsy, control of bleeding, polypectomy, and interventions as necessary has been discussed with the patient/patient representative. Informed consent was obtained from the patient/patient representative after explaining the indication, nature, and risks of the procedure including but not limited to death, bleeding, perforation, missed neoplasm/lesions, cardiorespiratory compromise, and reaction to medications. Opportunity for questions was given and appropriate answers were provided. Patient/patient representative has verbalized understanding is amenable to undergoing the procedure.   Annamaria Helling, DO  [facility] Gastroenterology  Portions of the record may have been created with voice recognition software. Occasional wrong-word or 'sound-a-like' substitutions may have occurred due to the inherent limitations of voice recognition software.  Read the chart carefully and recognize, using context, where substitutions may have occurred.

## 2022-05-06 ENCOUNTER — Ambulatory Visit
Admission: RE | Admit: 2022-05-06 | Discharge: 2022-05-06 | Disposition: A | Discharge status: Home / Self Care | Payer: Medicare Other | Source: Ambulatory Visit | Attending: Gastroenterology | Admitting: Gastroenterology

## 2022-05-06 ENCOUNTER — Ambulatory Visit: Payer: Medicare Other | Admitting: Anesthesiology

## 2022-05-06 ENCOUNTER — Encounter
Admission: RE | Disposition: A | Discharge status: Home / Self Care | Payer: Self-pay | Source: Ambulatory Visit | Attending: Gastroenterology

## 2022-05-06 ENCOUNTER — Encounter: Payer: Self-pay | Admitting: Gastroenterology

## 2022-05-06 DIAGNOSIS — Z800 Family history of malignant neoplasm of digestive organs: Secondary | ICD-10-CM | POA: Diagnosis not present

## 2022-05-06 DIAGNOSIS — F17210 Nicotine dependence, cigarettes, uncomplicated: Secondary | ICD-10-CM | POA: Diagnosis not present

## 2022-05-06 DIAGNOSIS — K2970 Gastritis, unspecified, without bleeding: Secondary | ICD-10-CM | POA: Insufficient documentation

## 2022-05-06 DIAGNOSIS — I2510 Atherosclerotic heart disease of native coronary artery without angina pectoris: Secondary | ICD-10-CM | POA: Diagnosis not present

## 2022-05-06 DIAGNOSIS — Z8711 Personal history of peptic ulcer disease: Secondary | ICD-10-CM | POA: Diagnosis not present

## 2022-05-06 DIAGNOSIS — K449 Diaphragmatic hernia without obstruction or gangrene: Secondary | ICD-10-CM | POA: Diagnosis not present

## 2022-05-06 DIAGNOSIS — K2100 Gastro-esophageal reflux disease with esophagitis, without bleeding: Secondary | ICD-10-CM | POA: Insufficient documentation

## 2022-05-06 DIAGNOSIS — K2210 Ulcer of esophagus without bleeding: Secondary | ICD-10-CM | POA: Diagnosis not present

## 2022-05-06 DIAGNOSIS — I10 Essential (primary) hypertension: Secondary | ICD-10-CM | POA: Diagnosis not present

## 2022-05-06 DIAGNOSIS — J449 Chronic obstructive pulmonary disease, unspecified: Secondary | ICD-10-CM | POA: Diagnosis not present

## 2022-05-06 DIAGNOSIS — Z8673 Personal history of transient ischemic attack (TIA), and cerebral infarction without residual deficits: Secondary | ICD-10-CM | POA: Insufficient documentation

## 2022-05-06 DIAGNOSIS — Z09 Encounter for follow-up examination after completed treatment for conditions other than malignant neoplasm: Secondary | ICD-10-CM | POA: Diagnosis not present

## 2022-05-06 DIAGNOSIS — Z79899 Other long term (current) drug therapy: Secondary | ICD-10-CM | POA: Diagnosis not present

## 2022-05-06 DIAGNOSIS — E039 Hypothyroidism, unspecified: Secondary | ICD-10-CM | POA: Diagnosis not present

## 2022-05-06 DIAGNOSIS — K209 Esophagitis, unspecified without bleeding: Secondary | ICD-10-CM | POA: Diagnosis not present

## 2022-05-06 HISTORY — PX: ESOPHAGOGASTRODUODENOSCOPY (EGD) WITH PROPOFOL: SHX5813

## 2022-05-06 SURGERY — ESOPHAGOGASTRODUODENOSCOPY (EGD) WITH PROPOFOL
Anesthesia: General

## 2022-05-06 MED ORDER — PROPOFOL 10 MG/ML IV BOLUS
INTRAVENOUS | Status: DC | PRN
  Administered 2022-05-06: 70 mg via INTRAVENOUS
  Administered 2022-05-06: 30 mg via INTRAVENOUS

## 2022-05-06 MED ORDER — SODIUM CHLORIDE 0.9 % IV SOLN
INTRAVENOUS | Status: DC
  Administered 2022-05-06: 1000 mL via INTRAVENOUS

## 2022-05-06 MED ORDER — PROPOFOL 500 MG/50ML IV EMUL
INTRAVENOUS | Status: DC | PRN
  Administered 2022-05-06: 120 ug/kg/min via INTRAVENOUS

## 2022-05-06 MED ORDER — LIDOCAINE 2% (20 MG/ML) 5 ML SYRINGE
INTRAMUSCULAR | Status: DC | PRN
  Administered 2022-05-06: 20 mg via INTRAVENOUS

## 2022-05-06 NOTE — Interval H&P Note (Signed)
History and Physical Interval Note: Preprocedure H&P from 05/06/22  was reviewed and there was no interval change after seeing and examining the patient.  Written consent was obtained from the patient after discussion of risks, benefits, and alternatives. Patient has consented to proceed with Esophagogastroduodenoscopy with possible intervention   05/06/2022 12:27 PM  Tiffany Orozco  has presented today for surgery, with the diagnosis of Z12.11 - Screen for colon cancer K22.10  - Esophagitis, erosive K26.9  - Duodenal ulcer.  The various methods of treatment have been discussed with the patient and family. After consideration of risks, benefits and other options for treatment, the patient has consented to  Procedure(s): ESOPHAGOGASTRODUODENOSCOPY (EGD) WITH PROPOFOL (N/A) as a surgical intervention.  The patient's history has been reviewed, patient examined, no change in status, stable for surgery.  I have reviewed the patient's chart and labs.  Questions were answered to the patient's satisfaction.     Annamaria Helling

## 2022-05-06 NOTE — Transfer of Care (Signed)
Immediate Anesthesia Transfer of Care Note  Patient: Tiffany Orozco  Procedure(s) Performed: ESOPHAGOGASTRODUODENOSCOPY (EGD) WITH PROPOFOL  Patient Location: PACU  Anesthesia Type:General  Level of Consciousness: drowsy  Airway & Oxygen Therapy: Patient Spontanous Breathing  Post-op Assessment: Report given to RN and Post -op Vital signs reviewed and stable  Post vital signs: Reviewed  Last Vitals:  Vitals Value Taken Time  BP 121/65 05/06/22 1243  Temp    Pulse 76 05/06/22 1243  Resp 18 05/06/22 1243  SpO2 95 % 05/06/22 1243  Vitals shown include unvalidated device data.  Last Pain:  Vitals:   05/06/22 1153  TempSrc: Temporal  PainSc: 0-No pain         Complications: No notable events documented.

## 2022-05-06 NOTE — Anesthesia Postprocedure Evaluation (Signed)
Anesthesia Post Note  Patient: Tiffany Orozco  Procedure(s) Performed: ESOPHAGOGASTRODUODENOSCOPY (EGD) WITH PROPOFOL  Patient location during evaluation: Endoscopy Anesthesia Type: General Level of consciousness: awake and alert Pain management: pain level controlled Vital Signs Assessment: post-procedure vital signs reviewed and stable Respiratory status: spontaneous breathing, nonlabored ventilation, respiratory function stable and patient connected to nasal cannula oxygen Cardiovascular status: blood pressure returned to baseline and stable Postop Assessment: no apparent nausea or vomiting Anesthetic complications: no   No notable events documented.   Last Vitals:  Vitals:   05/06/22 1243 05/06/22 1303  BP: 121/65 (!) 169/78  Pulse:    Resp: 17   Temp: (!) 36.1 C   SpO2: 95%     Last Pain:  Vitals:   05/06/22 1303  TempSrc:   PainSc: 0-No pain                 Precious Haws Ether Wolters

## 2022-05-06 NOTE — Anesthesia Preprocedure Evaluation (Signed)
Anesthesia Evaluation  Patient identified by MRN, date of birth, ID band Patient awake    Reviewed: Allergy & Precautions, NPO status , Patient's Chart, lab work & pertinent test results  History of Anesthesia Complications Negative for: history of anesthetic complications  Airway Mallampati: III  TM Distance: <3 FB Neck ROM: full    Dental  (+) Chipped, Poor Dentition, Missing   Pulmonary shortness of breath and with exertion, COPD, Current Smoker and Patient abstained from smoking.   Pulmonary exam normal        Cardiovascular hypertension, + CAD  Normal cardiovascular exam+ Valvular Problems/Murmurs      Neuro/Psych CVA, Residual Symptoms  negative psych ROS   GI/Hepatic Neg liver ROS, PUD,GERD  Controlled,,  Endo/Other  Hypothyroidism    Renal/GU negative Renal ROS  negative genitourinary   Musculoskeletal   Abdominal   Peds  Hematology negative hematology ROS (+)   Anesthesia Other Findings Past Medical History: No date: Allergy No date: Anxiety No date: COPD (chronic obstructive pulmonary disease) ([facility]) No date: DDD (degenerative disc disease), lumbar     Comment:  mild No date: Depression No date: Female rectocele without uterine prolapse No date: GERD (gastroesophageal reflux disease) No date: Hearing loss No date: Heart murmur No date: Hyperlipidemia No date: Hypertension No date: Hypothyroidism No date: IBS (irritable bowel syndrome) 06/2015: Influenza A 03/01/2022: Intractable nausea and vomiting No date: Obesity No date: Osteopenia No date: Plantar fasciitis No date: Pneumonia No date: Skin cancer of arm No date: Skin cancer of face 07/2010: Stroke [facility])     Comment:  no permenant deficits No date: Thrombocytopenia ([place]) No date: Tubal pregnancy  Past Surgical History: No date: APPENDECTOMY 2004: CHOLECYSTECTOMY No date: COLONOSCOPY No date: CYST EXCISION; Right     Comment:   breast mole 11/2002: EMGs     Comment:  atms and wrists neg 03/17/2015: ESOPHAGEAL MANOMETRY; N/A     Comment:  Procedure: ESOPHAGEAL MANOMETRY (EM);  Surgeon: Manus Gunning, MD;  Location: [facility] ENDOSCOPY;  Service:               Gastroenterology;  Laterality: N/A; 03/02/2022: ESOPHAGOGASTRODUODENOSCOPY; N/A     Comment:  Procedure: ESOPHAGOGASTRODUODENOSCOPY (EGD);  Surgeon:               Annamaria Helling, DO;  Location: [facility] ENDOSCOPY;                Service: Gastroenterology;  Laterality: N/A; 2008, 2009, 2010: FOOT SURGERY; Right     Comment:  torn ligament, hammertoes No date: HERNIA REPAIR 1975: TUBAL LIGATION No date: UPPER GI ENDOSCOPY 04/14/2020: XI ROBOTIC ASSISTED VENTRAL HERNIA; N/A     Comment:  Procedure: XI ROBOTIC ASSISTED VENTRAL HERNIA;  Surgeon:              Fredirick Maudlin, MD;  Location: [facility] ORS;  Service:               General;  Laterality: N/A;  BMI    Body Mass Index: 27.51 kg/m      Reproductive/Obstetrics negative OB ROS                             Anesthesia Physical Anesthesia Plan  ASA: 3  Anesthesia Plan: General   Post-op Pain Management:    Induction: Intravenous  PONV Risk Score and Plan: Propofol infusion and  TIVA  Airway Management Planned: Natural Airway and Nasal Cannula  Additional Equipment:   Intra-op Plan:   Post-operative Plan:   Informed Consent: I have reviewed the patients History and Physical, chart, labs and discussed the procedure including the risks, benefits and alternatives for the proposed anesthesia with the patient or authorized representative who has indicated his/her understanding and acceptance.     Dental Advisory Given  Plan Discussed with: Anesthesiologist, CRNA and Surgeon  Anesthesia Plan Comments: (Patient consented for risks of anesthesia including but not limited to:  - adverse reactions to medications - risk of airway placement if  required - damage to eyes, teeth, lips or other oral mucosa - nerve damage due to positioning  - sore throat or hoarseness - Damage to heart, brain, nerves, lungs, other parts of body or loss of life  Patient voiced understanding.)       Anesthesia Quick Evaluation

## 2022-05-06 NOTE — Op Note (Signed)
[facility] Gastroenterology Patient Name: Tiffany Orozco Procedure Date: 05/06/2022 11:19 AM MRN: [id number] Account #: [id number] Date of Birth: September 17, 1950 Admit Type: Outpatient Age: 71 Room: [facility] ENDO ROOM 1 Gender: Female Note Status: Finalized Instrument Name: Michaelle Birks [id number] Procedure:             Upper GI endoscopy Indications:           Follow-up of esophagitis Providers:             Annamaria Helling DO, DO Referring MD:          Jinny Sanders MD, MD (Referring MD) Medicines:             Monitored Anesthesia Care Complications:         No immediate complications. Estimated blood loss: None. Procedure:             Pre-Anesthesia Assessment:                        - Prior to the procedure, a History and Physical was                         performed, and patient medications and allergies were                         reviewed. The patient is competent. The risks and                         benefits of the procedure and the sedation options and                         risks were discussed with the patient. All questions                         were answered and informed consent was obtained.                         Patient identification and proposed procedure were                         verified by the physician, the nurse, the anesthetist                         and the technician in the endoscopy suite. Mental                         Status Examination: alert and oriented. Airway                         Examination: normal oropharyngeal airway and neck                         mobility. Respiratory Examination: clear to                         auscultation. CV Examination: RRR, no murmurs, no S3                         or S4. Prophylactic Antibiotics: The patient does not  require prophylactic antibiotics. Prior                         Anticoagulants: The patient has taken no anticoagulant                         or  antiplatelet agents. ASA Grade Assessment: III - A                         patient with severe systemic disease. After reviewing                         the risks and benefits, the patient was deemed in                         satisfactory condition to undergo the procedure. The                         anesthesia plan was to use monitored anesthesia care                         (MAC). Immediately prior to administration of                         medications, the patient was re-assessed for adequacy                         to receive sedatives. The heart rate, respiratory                         rate, oxygen saturations, blood pressure, adequacy of                         pulmonary ventilation, and response to care were                         monitored throughout the procedure. The physical                         status of the patient was re-assessed after the                         procedure.                        After obtaining informed consent, the endoscope was                         passed under direct vision. Throughout the procedure,                         the patient's blood pressure, pulse, and oxygen                         saturations were monitored continuously. The Endoscope                         was introduced through the mouth, and advanced to the  second part of duodenum. The upper GI endoscopy was                         accomplished without difficulty. The patient tolerated                         the procedure well. Findings:      The duodenal bulb, first portion of the duodenum and second portion of       the duodenum were normal. Ulceration has healed since previous endoscopy.      Localized mild inflammation characterized by erythema was found in the       gastric antrum. Estimated blood loss: none. previously negative for h       pylori.      A small hiatal hernia was present. Estimated blood loss: none.      The Z-line was regular.  Estimated blood loss: none.      Esophagogastric landmarks were identified: the gastroesophageal junction       was found at 35 cm from the incisors.      Normal mucosa was found in the entire esophagus. The scope was       withdrawn. Dilation was performed with a Maloney dilator with no       resistance at 48 Fr and 52 Fr. The dilation site was examined following       endoscope reinsertion and showed no change. Estimated blood loss: none.      The exam of the esophagus was otherwise normal. Impression:            - Normal duodenal bulb, first portion of the duodenum                         and second portion of the duodenum.                        - Gastritis.                        - Small hiatal hernia.                        - Z-line regular.                        - Esophagogastric landmarks identified.                        - Normal mucosa was found in the entire esophagus.                         Dilated.                        - No specimens collected. Recommendation:        - Patient has a contact number available for                         emergencies. The signs and symptoms of potential                         delayed complications were discussed with the patient.  Return to normal activities tomorrow. Written                         discharge instructions were provided to the patient.                        - Discharge patient to home.                        - Resume previous diet.                        - Continue present medications.                        - Use Protonix (pantoprazole) 40 mg PO daily.                        - Defer aggrenox resumption to providing physician                        - Return to referring physician as previously                         scheduled.                        - Colonoscopy in February 2024                        - The findings and recommendations were discussed with                         the patient.                         - Recommend tobacco cessation                        avoid non steroidal anti inflammatories such as bc                         powders, goody powders, ibuprofen, aleve, naproxyn,                         advil, motrin Procedure Code(s):     --- Professional ---                        [phone], Esophagogastroduodenoscopy, flexible,                         transoral; diagnostic, including collection of                         specimen(s) by brushing or washing, when performed                         (separate procedure)                        43450, Dilation of esophagus, by unguided sound or  bougie, single or multiple passes Diagnosis Code(s):     --- Professional ---                        K29.70, Gastritis, unspecified, without bleeding                        K44.9, Diaphragmatic hernia without obstruction or                         gangrene                        K20.90, Esophagitis, unspecified without bleeding CPT copyright 2022 American [employer] Association. All rights reserved. The codes documented in this report are preliminary and upon coder review may  be revised to meet current compliance requirements. Attending Participation:      I personally performed the entire procedure. Volney American, DO Annamaria Helling DO, DO 05/06/2022 12:46:00 PM This report has been signed electronically. Number of Addenda: 0 Note Initiated On: 05/06/2022 11:19 AM Estimated Blood Loss:  Estimated blood loss: none.      [facility]

## 2022-05-07 ENCOUNTER — Encounter: Payer: Self-pay | Admitting: Gastroenterology

## 2022-05-10 ENCOUNTER — Ambulatory Visit: Payer: Medicare Other | Admitting: Gastroenterology

## 2022-06-01 ENCOUNTER — Telehealth: Payer: Self-pay | Admitting: Family Medicine

## 2022-06-01 DIAGNOSIS — R7303 Prediabetes: Secondary | ICD-10-CM

## 2022-06-01 DIAGNOSIS — R937 Abnormal findings on diagnostic imaging of other parts of musculoskeletal system: Secondary | ICD-10-CM

## 2022-06-01 DIAGNOSIS — E538 Deficiency of other specified B group vitamins: Secondary | ICD-10-CM

## 2022-06-01 DIAGNOSIS — M810 Age-related osteoporosis without current pathological fracture: Secondary | ICD-10-CM

## 2022-06-01 DIAGNOSIS — E559 Vitamin D deficiency, unspecified: Secondary | ICD-10-CM

## 2022-06-01 DIAGNOSIS — E7800 Pure hypercholesterolemia, unspecified: Secondary | ICD-10-CM

## 2022-06-01 DIAGNOSIS — E039 Hypothyroidism, unspecified: Secondary | ICD-10-CM

## 2022-06-01 NOTE — Telephone Encounter (Signed)
-----   Message from Velna Hatchet, RT sent at 05/27/2022 12:54 PM EST ----- Regarding: Thu 1/4 lab Patient has labs only appt on 06/03/22.  Future lab orders needed, please.  Thanks, Anda Kraft

## 2022-06-03 ENCOUNTER — Other Ambulatory Visit (INDEPENDENT_AMBULATORY_CARE_PROVIDER_SITE_OTHER): Payer: Medicare Other

## 2022-06-03 DIAGNOSIS — E7800 Pure hypercholesterolemia, unspecified: Secondary | ICD-10-CM | POA: Diagnosis not present

## 2022-06-03 DIAGNOSIS — R7303 Prediabetes: Secondary | ICD-10-CM

## 2022-06-03 DIAGNOSIS — E538 Deficiency of other specified B group vitamins: Secondary | ICD-10-CM | POA: Diagnosis not present

## 2022-06-03 DIAGNOSIS — E039 Hypothyroidism, unspecified: Secondary | ICD-10-CM | POA: Diagnosis not present

## 2022-06-03 LAB — HEMOGLOBIN A1C: Hgb A1c MFr Bld: 6.1 % (ref 4.6–6.5)

## 2022-06-03 LAB — COMPREHENSIVE METABOLIC PANEL
ALT: 19 U/L (ref 0–35)
AST: 19 U/L (ref 0–37)
Albumin: 4.3 g/dL (ref 3.5–5.2)
Alkaline Phosphatase: 87 U/L (ref 39–117)
BUN: 11 mg/dL (ref 6–23)
CO2: 26 mEq/L (ref 19–32)
Calcium: 9 mg/dL (ref 8.4–10.5)
Chloride: 97 mEq/L (ref 96–112)
Creatinine, Ser: 0.59 mg/dL (ref 0.40–1.20)
GFR: 89.27 mL/min (ref >60.00)
Glucose, Bld: 83 mg/dL (ref 70–99)
Potassium: 4.3 mEq/L (ref 3.5–5.1)
Sodium: 132 mEq/L — ABNORMAL LOW (ref 135–145)
Total Bilirubin: 0.5 mg/dL (ref 0.2–1.2)
Total Protein: 6.7 g/dL (ref 6.0–8.3)

## 2022-06-03 LAB — TSH: TSH: 1.55 u[IU]/mL (ref 0.35–5.50)

## 2022-06-03 LAB — LIPID PANEL
Cholesterol: 162 mg/dL (ref 0–200)
HDL: 46.9 mg/dL (ref >39.00)
LDL Cholesterol: 84 mg/dL (ref 0–99)
NonHDL: 114.77
Total CHOL/HDL Ratio: 3
Triglycerides: 154 mg/dL — ABNORMAL HIGH (ref 0.0–149.0)
VLDL: 30.8 mg/dL (ref 0.0–40.0)

## 2022-06-03 LAB — T4, FREE: Free T4: 1.06 ng/dL (ref 0.60–1.60)

## 2022-06-03 LAB — T3, FREE: T3, Free: 5 pg/mL — ABNORMAL HIGH (ref 2.3–4.2)

## 2022-06-03 LAB — VITAMIN B12: Vitamin B-12: 1040 pg/mL — ABNORMAL HIGH (ref 211–911)

## 2022-06-04 ENCOUNTER — Other Ambulatory Visit: Payer: Self-pay | Admitting: Family Medicine

## 2022-06-04 NOTE — Progress Notes (Signed)
No critical labs need to be addressed urgently. We will discuss labs in detail at upcoming office visit.   

## 2022-06-09 ENCOUNTER — Ambulatory Visit (INDEPENDENT_AMBULATORY_CARE_PROVIDER_SITE_OTHER): Payer: 59 | Admitting: Family Medicine

## 2022-06-09 ENCOUNTER — Encounter: Payer: Self-pay | Admitting: Family Medicine

## 2022-06-09 VITALS — BP 138/70 | HR 83 | Temp 98.0°F | Resp 16 | Ht 59.0 in | Wt 167.5 lb

## 2022-06-09 DIAGNOSIS — Z8673 Personal history of transient ischemic attack (TIA), and cerebral infarction without residual deficits: Secondary | ICD-10-CM | POA: Diagnosis not present

## 2022-06-09 DIAGNOSIS — I10 Essential (primary) hypertension: Secondary | ICD-10-CM | POA: Diagnosis not present

## 2022-06-09 DIAGNOSIS — E871 Hypo-osmolality and hyponatremia: Secondary | ICD-10-CM | POA: Diagnosis not present

## 2022-06-09 DIAGNOSIS — E039 Hypothyroidism, unspecified: Secondary | ICD-10-CM | POA: Diagnosis not present

## 2022-06-09 DIAGNOSIS — E7800 Pure hypercholesterolemia, unspecified: Secondary | ICD-10-CM

## 2022-06-09 MED ORDER — ASPIRIN-DIPYRIDAMOLE ER 25-200 MG PO CP12: 1.0000 | ORAL_CAPSULE | Freq: Two times a day (BID) | ORAL | 0 refills | Status: DC

## 2022-06-09 MED ORDER — FLUTICASONE PROPIONATE 50 MCG/ACT NA SUSP: NASAL | 11 refills | Status: DC

## 2022-06-09 NOTE — Assessment & Plan Note (Signed)
Chronic, she is frustrated at having to take Armour Thyroid 2 separate tablets 60 alternating with 90.  In the past, levothyroxine has not worked well for her and she is not interested in returning to this. Recent T3 showed an elevated number.  We will try decreasing to 60 mg Armour Thyroid daily and follow-up with thyroid testing in 4 to 6 weeks.

## 2022-06-09 NOTE — Patient Instructions (Addendum)
Change to armour thyroid 60 daily.  Stop aspirin 81 mg daily  Try trial back on Aggrenox but if GI symptoms return.Marland Kitchen stop and return to only asa EC 81 mg daily.

## 2022-06-09 NOTE — Progress Notes (Signed)
Patient ID: Tiffany Orozco, female    DOB: [birth year], 72 y.o.   MRN: [id number]  This visit was conducted in person.  BP 138/70   Pulse 83   Temp 98 F (36.7 C)   Resp 16   Ht '4\' 11"'$  (1.499 m)   Wt 167 lb 8 oz (76 kg)   SpO2 95%   BMI 33.83 kg/m    CC:  Chief Complaint  Patient presents with   Thyroid Problem    Tired of taking 2 different thyroid medication   Dry Eye    Subjective:   [name] is a 72 y.o. female presenting on 06/09/2022 for Thyroid Problem (Tired of taking 2 different thyroid medication) and Dry Eye   Elevated Cholesterol:   control  on zetia 10 mg daily, intolerant of statins. Using medications without problems: Muscle aches:  Diet compliance: Exercise: Other complaints: Hx of CAD and CVA  Hypothyroid  On armour thyroid 60 mg alternating with 90 mcg daily Lab Results  Component Value Date   TSH 1.55 06/03/2022   Per pt report GI  Dr. Virgina Orozco told her to hold Aggrenox given PUD.Marland Kitchen on recent endoscopy , PUD resolved.  On PPI .Marland Kitchen Now only on once daily  She is not sure she should restart ... Contacted Dr. Virgina Orozco to discuss.      Relevant past medical, surgical, family and social history reviewed and updated as indicated. Interim medical history since our last visit reviewed. Allergies and medications reviewed and updated. Outpatient Medications Prior to Visit  Medication Sig Dispense Refill   albuterol (VENTOLIN HFA) 108 (90 Base) MCG/ACT inhaler INHALE 2 PUFFS BY MOUTH EVERY 6 HOURS AS NEEDED FOR WHEEZE OR SHORTNESS OF BREATH 18 each 11   ARMOUR THYROID 90 MG tablet Take 1 tablet every other day alternating with 60 mg. 45 tablet 3   Cholecalciferol (VITAMIN D3) 125 MCG (5000 UT) TABS Take 5,000 Units by mouth daily.     dipyridamole-aspirin (AGGRENOX) 200-25 MG 12hr capsule Take by mouth.     ezetimibe (ZETIA) 10 MG tablet Take 1 tablet (10 mg total) by mouth daily. 90 tablet 3   fluocinonide (LIDEX) 0.05 % external solution APPLY  TO SCALP AS DIRECTED AS NEEDED     fluticasone (FLONASE) 50 MCG/ACT nasal spray SPRAY 2 SPRAYS INTO EACH NOSTRIL EVERY DAY 48 mL 1   ketoconazole (NIZORAL) 2 % shampoo APPLY 1 APPLICATION TOPICALLY 2 (TWO) TIMES A WEEK.     Na Sulfate-K Sulfate-Mg Sulf 17.5-3.13-1.6 GM/177ML SOLN Take by mouth.     pantoprazole (PROTONIX) 40 MG tablet Take 1 tablet (40 mg total) by mouth 2 (two) times daily. 90 tablet 3   sucralfate (CARAFATE) 1 g tablet Take 1 tablet (1 g total) by mouth 3 (three) times daily as needed. 90 tablet 3   thyroid (ARMOUR THYROID) 60 MG tablet Take 1 tablet every other day alternating with 90 mg. 45 tablet 3   triamcinolone cream (KENALOG) 0.1 % APPLY A SMALL AMOUNT TO AFFECTED AREA TWICE A DAY AS NEED FOR ITCHING/INFLAMMATION.     fluorometholone (FLAREX) 0.1 % ophthalmic suspension  (Patient not taking: Reported on 06/09/2022)     nicotine (NICODERM CQ - DOSED IN MG/24 HOURS) 21 mg/24hr patch Place 1 patch (21 mg total) onto the skin daily. (Patient not taking: Reported on 06/09/2022) 28 patch 0   ondansetron (ZOFRAN) 4 MG tablet Take 4 mg by mouth daily. (Patient not taking: Reported on 06/09/2022)  No facility-administered medications prior to visit.     Per HPI unless specifically indicated in ROS section below Review of Systems  Constitutional:  Negative for fatigue and fever.  HENT:  Negative for congestion.   Eyes:  Negative for pain.  Respiratory:  Negative for cough and shortness of breath.   Cardiovascular:  Negative for chest pain, palpitations and leg swelling.  Gastrointestinal:  Negative for abdominal pain.  Genitourinary:  Negative for dysuria and vaginal bleeding.  Musculoskeletal:  Negative for back pain.  Neurological:  Negative for syncope, light-headedness and headaches.  Psychiatric/Behavioral:  Negative for dysphoric mood.    Objective:  BP 138/70   Pulse 83   Temp 98 F (36.7 C)   Resp 16   Ht '4\' 11"'$  (1.499 m)   Wt 167 lb 8 oz (76 kg)   SpO2 95%    BMI 33.83 kg/m   Wt Readings from Last 3 Encounters:  06/09/22 167 lb 8 oz (76 kg)  05/06/22 165 lb 5.5 oz (75 kg)  04/14/22 162 lb (73.5 kg)      Physical Exam Constitutional:      General: She is not in acute distress.    Appearance: Normal appearance. She is well-developed. She is not ill-appearing or toxic-appearing.  HENT:     Head: Normocephalic.     Right Ear: Hearing, tympanic membrane, ear canal and external ear normal. Tympanic membrane is not erythematous, retracted or bulging.     Left Ear: Hearing, tympanic membrane, ear canal and external ear normal. Tympanic membrane is not erythematous, retracted or bulging.     Nose: No mucosal edema or rhinorrhea.     Right Sinus: No maxillary sinus tenderness or frontal sinus tenderness.     Left Sinus: No maxillary sinus tenderness or frontal sinus tenderness.     Mouth/Throat:     Pharynx: Uvula midline.  Eyes:     General: Lids are normal. Lids are everted, no foreign bodies appreciated.     Conjunctiva/sclera: Conjunctivae normal.     Pupils: Pupils are equal, round, and reactive to light.  Neck:     Thyroid: No thyroid mass or thyromegaly.     Vascular: No carotid bruit.     Trachea: Trachea normal.  Cardiovascular:     Rate and Rhythm: Normal rate and regular rhythm.     Pulses: Normal pulses.     Heart sounds: Normal heart sounds, S1 normal and S2 normal. No murmur heard.    No friction rub. No gallop.  Pulmonary:     Effort: Pulmonary effort is normal. No tachypnea or respiratory distress.     Breath sounds: Normal breath sounds. No decreased breath sounds, wheezing, rhonchi or rales.  Abdominal:     General: Bowel sounds are normal.     Palpations: Abdomen is soft.     Tenderness: There is no abdominal tenderness.  Musculoskeletal:     Cervical back: Normal range of motion and neck supple.  Skin:    General: Skin is warm and dry.     Findings: No rash.  Neurological:     Mental Status: She is alert.   Psychiatric:        Mood and Affect: Mood is not anxious or depressed.        Speech: Speech normal.        Behavior: Behavior normal. Behavior is cooperative.        Thought Content: Thought content normal.        Judgment: Judgment  normal.       Results for orders placed or performed in visit on 06/03/22  TSH  Result Value Ref Range   TSH 1.55 0.35 - 5.50 uIU/mL  T4, free  Result Value Ref Range   Free T4 1.06 0.60 - 1.60 ng/dL  T3, free  Result Value Ref Range   T3, Free 5.0 (H) 2.3 - 4.2 pg/mL  Vitamin B12  Result Value Ref Range   Vitamin B-12 1,040 (H) 211 - 911 pg/mL  Comprehensive metabolic panel  Result Value Ref Range   Sodium 132 (L) 135 - 145 mEq/L   Potassium 4.3 3.5 - 5.1 mEq/L   Chloride 97 96 - 112 mEq/L   CO2 26 19 - 32 mEq/L   Glucose, Bld 83 70 - 99 mg/dL   BUN 11 6 - 23 mg/dL   Creatinine, Ser 0.59 0.40 - 1.20 mg/dL   Total Bilirubin 0.5 0.2 - 1.2 mg/dL   Alkaline Phosphatase 87 39 - 117 U/L   AST 19 0 - 37 U/L   ALT 19 0 - 35 U/L   Total Protein 6.7 6.0 - 8.3 g/dL   Albumin 4.3 3.5 - 5.2 g/dL   GFR 89.27 >60.00 mL/min   Calcium 9.0 8.4 - 10.5 mg/dL  Lipid panel  Result Value Ref Range   Cholesterol 162 0 - 200 mg/dL   Triglycerides 154.0 (H) 0.0 - 149.0 mg/dL   HDL 46.90 >39.00 mg/dL   VLDL 30.8 0.0 - 40.0 mg/dL   LDL Cholesterol 84 0 - 99 mg/dL   Total CHOL/HDL Ratio 3    NonHDL 114.77   Hemoglobin A1c  Result Value Ref Range   Hgb A1c MFr Bld 6.1 4.6 - 6.5 %    Assessment and Plan  Hypothyroidism, unspecified type Assessment & Plan: Chronic, she is frustrated at having to take Armour Thyroid 2 separate tablets 60 alternating with 90.  In the past, levothyroxine has not worked well for her and she is not interested in returning to this. Recent T3 showed an elevated number.  We will try decreasing to 60 mg Armour Thyroid daily and follow-up with thyroid testing in 4 to 6 weeks.  Orders: -     T4, free; Future -     T3, free;  Future -     TSH; Future  HYPERCHOLESTEROLEMIA Assessment & Plan: Chronic, improved control back on zetia 10 mg daily.  LDL almost at goal < 70 at 84 Intolerant of statins... indicated give CAD history    Hyponatremia Assessment & Plan: Chronic, but stable. Limit water intake and increase salt intake.   Primary hypertension Assessment & Plan: Chronic,  improved control on now medication.   History of CVA (cerebrovascular accident) Assessment & Plan: History of CVA requiring tPA in 2013.  Previous neurologist Dr. Leonie Man, she has not seen him since 2017 She had been previously maintained on Aggrenox, but this was held given her GI issues including ulcer.  She has not yet returned on this but is on 81 mg aspirin daily. She is very uncomfortable not being on Aggrenox but she is worried she will have a recurrent stroke. I contacted Dr. Virgina Orozco, GI to discuss this issue.  He suggested we consider baby aspirin or neurology consult.  I encouraged patient to continue aspirin 81 mg since her GI symptoms have been significantly improved.  She feels strongly about wanting to start back on the Aggrenox.  I encouraged her to consider contacting Dr.Sethi, neurology about this.  She will at minimum hold aspirin and restart Aggrenox twice daily over the next month.  If some of her GI symptoms return including her chronic nausea belching and abdominal pain she will stop and return to baby aspirin.   Other orders -     Fluticasone Propionate; SPRAY 2 SPRAYS INTO EACH NOSTRIL EVERY DAY  Dispense: 48 mL; Refill: 11 -     Aspirin-Dipyridamole ER; Take 1 capsule by mouth 2 (two) times daily.  Dispense: 60 capsule; Refill: 0     No follow-ups on file.   [name], MD

## 2022-06-09 NOTE — Assessment & Plan Note (Signed)
History of CVA requiring tPA in 2013.  Previous neurologist Dr. Leonie Man, she has not seen him since 2017 She had been previously maintained on Aggrenox, but this was held given her GI issues including ulcer.  She has not yet returned on this but is on 81 mg aspirin daily. She is very uncomfortable not being on Aggrenox but she is worried she will have a recurrent stroke. I contacted Dr. Virgina Jock, GI to discuss this issue.  He suggested we consider baby aspirin or neurology consult.  I encouraged patient to continue aspirin 81 mg since her GI symptoms have been significantly improved.  She feels strongly about wanting to start back on the Aggrenox.  I encouraged her to consider contacting Dr.Sethi, neurology about this.  She will at minimum hold aspirin and restart Aggrenox twice daily over the next month.  If some of her GI symptoms return including her chronic nausea belching and abdominal pain she will stop and return to baby aspirin.

## 2022-06-09 NOTE — Assessment & Plan Note (Signed)
Chronic, but stable. Limit water intake and increase salt intake.

## 2022-06-09 NOTE — Assessment & Plan Note (Signed)
Chronic, improved control back on zetia 10 mg daily.  LDL almost at goal < 70 at 84 Intolerant of statins... indicated give CAD history

## 2022-06-09 NOTE — Assessment & Plan Note (Signed)
Chronic,  improved control on now medication.

## 2022-07-07 ENCOUNTER — Ambulatory Visit: Payer: 59 | Admitting: Family Medicine

## 2022-07-07 ENCOUNTER — Other Ambulatory Visit (INDEPENDENT_AMBULATORY_CARE_PROVIDER_SITE_OTHER): Payer: 59

## 2022-07-07 DIAGNOSIS — E039 Hypothyroidism, unspecified: Secondary | ICD-10-CM | POA: Diagnosis not present

## 2022-07-07 LAB — T4, FREE: Free T4: 0.74 ng/dL (ref 0.60–1.60)

## 2022-07-07 LAB — TSH: TSH: 3.19 u[IU]/mL (ref 0.35–5.50)

## 2022-07-07 LAB — T3, FREE: T3, Free: 2.8 pg/mL (ref 2.3–4.2)

## 2022-07-08 ENCOUNTER — Telehealth: Payer: Self-pay | Admitting: Family Medicine

## 2022-07-08 NOTE — Telephone Encounter (Signed)
See end of access note; pt will go to UC and if condition worsens will call 911 and go to ED. Sending note to Dr Diona Browner, Diona Browner pool and will teams Lattie Haw CMA who worked with Dr Diona Browner this AM and Lavina Hamman CMA who is working with Dr Diona Browner this afternoon.

## 2022-07-08 NOTE — Telephone Encounter (Signed)
Patient called in stating she is experiencing some chest tightness, wheezing, rattling in her chest, cough, and left ear pain. Sent over to access nurse.

## 2022-07-08 NOTE — Progress Notes (Signed)
No critical labs need to be addressed urgently. We will discuss labs in detail at upcoming office visit.   

## 2022-07-08 NOTE — Telephone Encounter (Signed)
[facility] - Client TELEPHONE ADVICE RECORD [facility] Patient Name: Tiffany Orozco Gender: Female DOB: 1951-05-18 Age: 73 Y 84 M 26 D Return Phone Number: [phone] (Primary) Address[place]  [address] Client [facility] - Client Client Site [place] - Day Provider [name] - MD Contact Type Call Who Is Calling Patient / Member / Family / Caregiver Call Type Triage / Clinical Relationship To Patient Self Return Phone Number [phone] (Primary) Chief Complaint CHEST PAIN - pain, pressure, heaviness or tightness Reason for Call Symptomatic / Request for [place] states that she has chest tightness, cough and wheezing. [place] later today Translation No Nurse Assessment Nurse: Alvis Lemmings, RN, Marcie Bal Date/Time (Eastern Time): 07/08/2022 12:49:14 PM Confirm and document reason for call. If symptomatic, describe symptoms. ---Caller states that she has chest tightness, cough and wheezing. Went to Dr yesterday for labs. Last night felt unwell, started coughing. Taking cough meds this a.m. Nasal spray. No fever. Left earache too. Has not tested at home for Covid. Does the patient have any new or worsening symptoms? ---Yes Will a triage be completed? ---Yes Related visit to physician within the last 2 weeks? ---No Does the PT have any chronic conditions? (i.e. diabetes, asthma, this includes High risk factors for pregnancy, etc.) ---Yes List chronic conditions. ---low sodium, dehydrated quickly, agrinox for stroke, thyroid, GERD, Is this a behavioral health or substance abuse call? ---No Guidelines Guideline Title Affirmed Question Affirmed Notes Nurse Date/Time (Eastern Time) Cough - Acute NonProductive Chest pain (Exception: MILD central chest pain, present only when coughing.) Etter Sjogren 07/08/2022 12:52:39 PM PLEASE NOTE: All timestamps contained within this report are represented as [place] Standard Time. CONFIDENTIALTY NOTICE: This fax transmission is intended only for the addressee. It contains information that is legally privileged, confidential or otherwise protected from use or disclosure. If you are not the intended recipient, you are strictly prohibited from reviewing, disclosing, copying using or disseminating any of this information or taking any action in reliance on or regarding this information. If you have received this fax in error, please notify us immediately by telephone so that we can arrange for its return to [place]. Phone: [phone], Toll-Free: [phone], Fax: [phone] Page: 2 of 2 Call Id: [id number] [place]. Time Eilene Ghazi Time) Disposition Final User 07/08/2022 12:46:32 PM Send to Urgent Ala Dach 07/08/2022 12:55:25 PM Go to ED Now Yes Alvis Lemmings, RN, Marcie Bal Final Disposition 07/08/2022 12:55:25 PM Go to ED Now Yes Alvis Lemmings, RN, Lenon Oms Disagree/Comply Disagree Caller Understands Yes PreDisposition Call Doctor Care Advice Given Per Guideline GO TO ED NOW: * You need to be seen in the Emergency Department. * Go to the ED at ___________ [place] now. Drive carefully. ANOTHER ADULT SHOULD DRIVE: * It is better and safer if another adult drives instead of you. CARE ADVICE given per Cough - Acute Non-Productive (Adult) guideline. BRING MEDICINES: * Bring a list of your current medicines when you go to the Emergency Department (ER). CALL EMS 911 IF: * Severe difficulty breathing occurs * Lips or face turns blue * Confusion occurs. Comments User: Manning Charity, RN Date/Time Eilene Ghazi Time): 07/08/2022 12:58:51 PM Pt states she will go to UC later and advised with outcome to go to ER. STates if she gets worsen, will call 911 as she d/n have a ride now. Referrals GO TO FACILITY OTHER - SPECIF

## 2022-07-09 ENCOUNTER — Ambulatory Visit (INDEPENDENT_AMBULATORY_CARE_PROVIDER_SITE_OTHER): Payer: 59 | Admitting: Family Medicine

## 2022-07-09 ENCOUNTER — Telehealth: Payer: Self-pay | Admitting: Family Medicine

## 2022-07-09 ENCOUNTER — Encounter: Payer: Self-pay | Admitting: Family Medicine

## 2022-07-09 VITALS — BP 141/64 | HR 82 | Temp 97.7°F | Ht 59.0 in | Wt 168.0 lb

## 2022-07-09 DIAGNOSIS — E038 Other specified hypothyroidism: Secondary | ICD-10-CM

## 2022-07-09 DIAGNOSIS — R051 Acute cough: Secondary | ICD-10-CM | POA: Diagnosis not present

## 2022-07-09 DIAGNOSIS — R350 Frequency of micturition: Secondary | ICD-10-CM | POA: Diagnosis not present

## 2022-07-09 LAB — POC URINALSYSI DIPSTICK (AUTOMATED)
Bilirubin, UA: NEGATIVE
Glucose, UA: NEGATIVE
Ketones, UA: NEGATIVE
Leukocytes, UA: NEGATIVE
Nitrite, UA: NEGATIVE
Protein, UA: POSITIVE — AB
Spec Grav, UA: 1.02 (ref 1.010–1.025)
Urobilinogen, UA: 0.2 E.U./dL
pH, UA: 6 (ref 5.0–8.0)

## 2022-07-09 MED ORDER — THYROID 60 MG PO TABS: ORAL_TABLET | ORAL | 3 refills | Status: DC

## 2022-07-09 MED ORDER — GUAIFENESIN-CODEINE 100-10 MG/5ML PO SYRP: 5.0000 mL | ORAL_SOLUTION | Freq: Every evening | ORAL | 0 refills | Status: DC | PRN

## 2022-07-09 MED ORDER — AZITHROMYCIN 250 MG PO TABS: ORAL_TABLET | ORAL | 0 refills | Status: DC

## 2022-07-09 NOTE — Patient Instructions (Signed)
Complete antibiotics. Rest,  push fluids.  Continue flonase 2 spray per nostril daily.  Use mucinex during the day and prescription cough suppressant at night.

## 2022-07-09 NOTE — Telephone Encounter (Signed)
[facility] called in and stated [facility] [place] [facility] guaiFENesin-codeine (ROBITUSSIN AC) 100-10 MG/5ML syrup. She would like for [id number] Please advise. Thank you!

## 2022-07-09 NOTE — Addendum Note (Signed)
Addended by: Carter Kitten on: 07/09/2022 12:21 PM   Modules accepted: Orders

## 2022-07-09 NOTE — Assessment & Plan Note (Signed)
Acute, ongoing greater than 7 days with likely initial viral infection now with bacterial superinfection.  Given wheezing concerning for bacterial bronchitis.  Will treat with azithromycin 500 mg day 1 followed by 250 mg x 4 days. She refuses prednisone taper for wheeze given she has had side effects to this in the past.  She does have albuterol prescription at the pharmacy that she can use 2 puffs inhaled every 4-6 hours as needed for shortness of breath and wheeze. Prescription cough suppressant sent in for her to use at bedtime to help with cough.  Return and ER precautions provided

## 2022-07-09 NOTE — Addendum Note (Signed)
Addended by: Carter Kitten on: 07/09/2022 02:39 PM   Modules accepted: Orders

## 2022-07-09 NOTE — Assessment & Plan Note (Signed)
Acute, she has noted low back pain and increase in her baseline urinary frequency.  We will check a urinalysis today.  If it is abnormal we will send a urine culture. Of note UTI would be covered by azithromycin which she will be on for the bronchitis.

## 2022-07-09 NOTE — Assessment & Plan Note (Signed)
Chronic, well-controlled requested refill of medication  Armour Thyroid 60 mg daily Lab Results  Component Value Date   TSH 3.19 07/07/2022

## 2022-07-09 NOTE — Telephone Encounter (Signed)
[facility] [address] [facility] requested.

## 2022-07-09 NOTE — Progress Notes (Signed)
Patient ID: Tiffany Orozco, female    DOB: 1951-01-22, 72 y.o.   MRN: [id number]  This visit was conducted in person.  BP (!) 141/64 (BP Location: Left Arm, Cuff Size: Large)   Pulse 82   Temp 97.7 F (36.5 C) (Temporal)   Ht 4' 11"$  (1.499 m)   Wt 168 lb (76.2 kg)   SpO2 97%   BMI 33.93 kg/m    CC:  Chief Complaint  Patient presents with   Ear Pain    Left-With swelling underneath ear    Cough    with chest congestion    Wheezing    Subjective:   [name] is a 72 y.o. female with history of HTNpresenting on 07/09/2022 for Ear Pain (Left-With swelling underneath ear/), Cough (with chest congestion/), and Wheezing   Date of onset:   cough ongoing for 1 week worse in last  2 days Initial symptoms included  dry cough Symptoms progressed to cough with chest congestion, left ear pain,  Scratchy throat Chest tight and hurts with coughing  Noted wheeze and SOB when walking  No fever.    Sick contacts:  had labs done day before at office, daughter with bronchitis COVID testing:   none     She has tried to treat with  mucinex, flonsae, throat spray.  Has albuterol inhaler   No history of chronic lung disease such as asthma or COPD. Non-smoker.    Having pain in low back.. no dysuria or has increase in frequency and urgency, no abd pain.  No blood in urine     Relevant past medical, surgical, family and social history reviewed and updated as indicated. Interim medical history since our last visit reviewed. Allergies and medications reviewed and updated. Outpatient Medications Prior to Visit  Medication Sig Dispense Refill   albuterol (VENTOLIN HFA) 108 (90 Base) MCG/ACT inhaler INHALE 2 PUFFS BY MOUTH EVERY 6 HOURS AS NEEDED FOR WHEEZE OR SHORTNESS OF BREATH 18 each 11   Cholecalciferol (VITAMIN D3) 125 MCG (5000 UT) TABS Take 5,000 Units by mouth daily.     dipyridamole-aspirin (AGGRENOX) 200-25 MG 12hr capsule Take 1 capsule by mouth 2 (two) times  daily. 60 capsule 0   ezetimibe (ZETIA) 10 MG tablet Take 1 tablet (10 mg total) by mouth daily. 90 tablet 3   fluocinonide (LIDEX) 0.05 % external solution APPLY TO SCALP AS DIRECTED AS NEEDED     fluticasone (FLONASE) 50 MCG/ACT nasal spray SPRAY 2 SPRAYS INTO EACH NOSTRIL EVERY DAY 48 mL 11   ketoconazole (NIZORAL) 2 % shampoo APPLY 1 APPLICATION TOPICALLY 2 (TWO) TIMES A WEEK.     pantoprazole (PROTONIX) 40 MG tablet Take 1 tablet (40 mg total) by mouth 2 (two) times daily. 90 tablet 3   sucralfate (CARAFATE) 1 g tablet Take 1 tablet (1 g total) by mouth 3 (three) times daily as needed. 90 tablet 3   triamcinolone cream (KENALOG) 0.1 % APPLY A SMALL AMOUNT TO AFFECTED AREA TWICE A DAY AS NEED FOR ITCHING/INFLAMMATION.     thyroid (ARMOUR THYROID) 60 MG tablet Take 1 tablet every other day alternating with 90 mg. 45 tablet 3   ARMOUR THYROID 90 MG tablet Take 1 tablet every other day alternating with 60 mg. 45 tablet 3   fluorometholone (FLAREX) 0.1 % ophthalmic suspension  (Patient not taking: Reported on 06/09/2022)     Na Sulfate-K Sulfate-Mg Sulf 17.5-3.13-1.6 GM/177ML SOLN Take by mouth.     nicotine (  NICODERM CQ - DOSED IN MG/24 HOURS) 21 mg/24hr patch Place 1 patch (21 mg total) onto the skin daily. (Patient not taking: Reported on 06/09/2022) 28 patch 0   ondansetron (ZOFRAN) 4 MG tablet Take 4 mg by mouth daily. (Patient not taking: Reported on 06/09/2022)     No facility-administered medications prior to visit.     Per HPI unless specifically indicated in ROS section below Review of Systems  Constitutional:  Negative for fatigue and fever.  HENT:  Positive for congestion and rhinorrhea.   Eyes:  Positive for pain.  Respiratory:  Positive for cough, shortness of breath and wheezing.   Cardiovascular:  Positive for chest pain. Negative for palpitations and leg swelling.  Gastrointestinal:  Negative for abdominal pain.  Genitourinary:  Negative for dysuria and vaginal bleeding.   Musculoskeletal:  Positive for back pain.  Neurological:  Negative for syncope, light-headedness and headaches.  Psychiatric/Behavioral:  Negative for dysphoric mood.    Objective:  BP (!) 141/64 (BP Location: Left Arm, Cuff Size: Large)   Pulse 82   Temp 97.7 F (36.5 C) (Temporal)   Ht 4' 11"$  (1.499 m)   Wt 168 lb (76.2 kg)   SpO2 97%   BMI 33.93 kg/m   Wt Readings from Last 3 Encounters:  07/09/22 168 lb (76.2 kg)  06/09/22 167 lb 8 oz (76 kg)  05/06/22 165 lb 5.5 oz (75 kg)      Physical Exam Constitutional:      General: She is not in acute distress.    Appearance: She is well-developed. She is not ill-appearing or toxic-appearing.  HENT:     Head: Normocephalic.     Right Ear: Hearing, ear canal and external ear normal. A middle ear effusion is present. Tympanic membrane is not erythematous, retracted or bulging.     Left Ear: Hearing, ear canal and external ear normal. A middle ear effusion is present. Tympanic membrane is not erythematous, retracted or bulging.     Ears:     Comments:  Abrasion left ear canal    Nose: Mucosal edema and rhinorrhea present.     Right Sinus: No maxillary sinus tenderness or frontal sinus tenderness.     Left Sinus: No maxillary sinus tenderness or frontal sinus tenderness.     Mouth/Throat:     Pharynx: Uvula midline.  Eyes:     General: Lids are normal. Lids are everted, no foreign bodies appreciated.     Conjunctiva/sclera: Conjunctivae normal.     Pupils: Pupils are equal, round, and reactive to light.  Neck:     Thyroid: No thyroid mass or thyromegaly.     Vascular: No carotid bruit.     Trachea: Trachea normal.  Cardiovascular:     Rate and Rhythm: Normal rate and regular rhythm.     Pulses: Normal pulses.     Heart sounds: Normal heart sounds, S1 normal and S2 normal. No murmur heard.    No friction rub. No gallop.  Pulmonary:     Effort: Pulmonary effort is normal. No tachypnea or respiratory distress.     Breath  sounds: Wheezing present. No decreased breath sounds, rhonchi or rales.     Comments:  Scattered wheeze and rhonchi Musculoskeletal:     Cervical back: Normal range of motion and neck supple.  Skin:    General: Skin is warm and dry.     Findings: No rash.  Neurological:     Mental Status: She is alert.  Psychiatric:  Mood and Affect: Mood is not anxious or depressed.        Speech: Speech normal.        Behavior: Behavior normal. Behavior is cooperative.        Judgment: Judgment normal.       Results for orders placed or performed in visit on 07/07/22  TSH  Result Value Ref Range   TSH 3.19 0.35 - 5.50 uIU/mL  T3, free  Result Value Ref Range   T3, Free 2.8 2.3 - 4.2 pg/mL  T4, free  Result Value Ref Range   Free T4 0.74 0.60 - 1.60 ng/dL    Assessment and Plan  Acute cough Assessment & Plan: Acute, ongoing greater than 7 days with likely initial viral infection now with bacterial superinfection.  Given wheezing concerning for bacterial bronchitis.  Will treat with azithromycin 500 mg day 1 followed by 250 mg x 4 days. She refuses prednisone taper for wheeze given she has had side effects to this in the past.  She does have albuterol prescription at the pharmacy that she can use 2 puffs inhaled every 4-6 hours as needed for shortness of breath and wheeze. Prescription cough suppressant sent in for her to use at bedtime to help with cough.  Return and ER precautions provided   Urinary frequency Assessment & Plan: Acute, she has noted low back pain and increase in her baseline urinary frequency.  We will check a urinalysis today.  If it is abnormal we will send a urine culture. Of note UTI would be covered by azithromycin which she will be on for the bronchitis.   Other orders -     Thyroid; Take 1 tablet daily  Dispense: 90 tablet; Refill: 3 -     Azithromycin; 2 tab po x 1 day then 1 tab po daily  Dispense: 6 tablet; Refill: 0 -     guaiFENesin-Codeine; Take  5-10 mLs by mouth at bedtime as needed for cough.  Dispense: 180 mL; Refill: 0    No follow-ups on file.   [name], MD

## 2022-07-10 LAB — URINE CULTURE
MICRO NUMBER:: 14545006
SPECIMEN QUALITY:: ADEQUATE

## 2022-07-12 ENCOUNTER — Telehealth: Payer: Self-pay | Admitting: Family Medicine

## 2022-07-12 ENCOUNTER — Other Ambulatory Visit (HOSPITAL_COMMUNITY): Payer: Self-pay

## 2022-07-12 NOTE — Telephone Encounter (Signed)
Patient called in stating that her insurance [employer] will not cover herthyroid (ARMOUR THYROID) 60 MG tablet   medication that she has been on for years. She is stating that they wanting her to take the generic medication but this is the only medication that helps her,she will need Dr Diona Browner to send in a formally exception for her to be able to get this medication covered.

## 2022-07-12 NOTE — Telephone Encounter (Signed)
Pharmacy Patient Advocate Encounter   Received notification that prior authorization for Armour Thyroid 60MG tablets is required/requested.  Per Test Claim: PA required   PA submitted on 07/12/22 to (ins) [employer] via CoverMyMeds Key [id number] Status is pending

## 2022-07-12 NOTE — Telephone Encounter (Signed)
Please submit a PA for formulary exemption.

## 2022-07-13 NOTE — Telephone Encounter (Signed)
Contact patient.  It appears Armour Thyroid is no longer covered by her insurance.  Does she wish to pay for this out-of-pocket?  If so I believe she can just go to the pharmacy to obtain it.  Or look at GoodRx?

## 2022-07-13 NOTE — Telephone Encounter (Signed)
Pharmacy Patient Advocate Encounter  Received notification from [employer] that the request for prior authorization for Armour Thyroid 60MG tablets has been denied due to not meeting the prior authorization requirement(s).       Please be advised we currently do not have a Pharmacist to review denials, therefore you will need to process appeals accordingly as needed. Thanks for your support at this time.   You may call [phone] to appeal. Reference #: [id number]

## 2022-07-14 NOTE — Telephone Encounter (Signed)
Ms. Njoroge notified as instructed by telephone.  Patient request that we submit an appeal letter.  Letter written and placed in Dr. Rometta Emery office in box to review and edit if needed.

## 2022-07-15 MED ORDER — PREDNISONE 20 MG PO TABS: ORAL_TABLET | ORAL | 0 refills | Status: DC

## 2022-07-15 MED ORDER — ALBUTEROL SULFATE HFA 108 (90 BASE) MCG/ACT IN AERS: 2.0000 | INHALATION_SPRAY | Freq: Four times a day (QID) | RESPIRATORY_TRACT | 0 refills | Status: DC | PRN

## 2022-07-15 MED ORDER — BENZONATATE 200 MG PO CAPS: 200.0000 mg | ORAL_CAPSULE | Freq: Two times a day (BID) | ORAL | 0 refills | Status: DC | PRN

## 2022-07-15 NOTE — Telephone Encounter (Signed)
I will send in a prescription for a prednisone taper and albuterol to use prn ( she does already have this on her med list).  Will also send benzonatate to use for dry cough.

## 2022-07-15 NOTE — Telephone Encounter (Signed)
Patient called in and stated that she isn't able to take the guaiFENesin-codeine (ROBITUSSIN AC) 100-10 MG/5ML syrup because it has codeine in it, and she was wanting to know if something else could be called in. She was wondering if there was something that can be given to her to help with the wheezing also. Please advise. Thank you!

## 2022-07-15 NOTE — Addendum Note (Signed)
Addended by: [name] E on: 07/15/2022 02:35 PM   Modules accepted: Orders

## 2022-07-15 NOTE — Telephone Encounter (Signed)
Ms. Linke notified as instructed by telephone.

## 2022-07-15 NOTE — Telephone Encounter (Signed)
Appeal Letter faxed to [phone].

## 2022-07-23 ENCOUNTER — Encounter: Payer: Self-pay | Admitting: Family Medicine

## 2022-07-23 ENCOUNTER — Ambulatory Visit (INDEPENDENT_AMBULATORY_CARE_PROVIDER_SITE_OTHER): Payer: 59 | Admitting: Family Medicine

## 2022-07-23 ENCOUNTER — Ambulatory Visit (INDEPENDENT_AMBULATORY_CARE_PROVIDER_SITE_OTHER)
Admission: RE | Admit: 2022-07-23 | Discharge: 2022-07-23 | Disposition: A | Discharge status: Home / Self Care | Payer: 59 | Source: Ambulatory Visit | Attending: Family Medicine | Admitting: Family Medicine

## 2022-07-23 VITALS — BP 150/61 | HR 73 | Temp 97.7°F | Ht 59.0 in | Wt 171.0 lb

## 2022-07-23 DIAGNOSIS — R350 Frequency of micturition: Secondary | ICD-10-CM

## 2022-07-23 DIAGNOSIS — R051 Acute cough: Secondary | ICD-10-CM

## 2022-07-23 DIAGNOSIS — I10 Essential (primary) hypertension: Secondary | ICD-10-CM

## 2022-07-23 DIAGNOSIS — M549 Dorsalgia, unspecified: Secondary | ICD-10-CM | POA: Diagnosis not present

## 2022-07-23 DIAGNOSIS — R059 Cough, unspecified: Secondary | ICD-10-CM | POA: Diagnosis not present

## 2022-07-23 LAB — POC URINALSYSI DIPSTICK (AUTOMATED)
Bilirubin, UA: NEGATIVE
Glucose, UA: NEGATIVE
Ketones, UA: NEGATIVE
Leukocytes, UA: NEGATIVE
Nitrite, UA: NEGATIVE
Protein, UA: POSITIVE — AB
Spec Grav, UA: 1.02 (ref 1.010–1.025)
Urobilinogen, UA: 0.2 E.U./dL
pH, UA: 6 (ref 5.0–8.0)

## 2022-07-23 MED ORDER — IPRATROPIUM BROMIDE 0.02 % IN SOLN
0.5000 mg | Freq: Once | RESPIRATORY_TRACT | Status: AC
  Administered 2022-07-23: 0.5 mg via RESPIRATORY_TRACT

## 2022-07-23 MED ORDER — ALBUTEROL SULFATE (2.5 MG/3ML) 0.083% IN NEBU
2.5000 mg | INHALATION_SOLUTION | Freq: Once | RESPIRATORY_TRACT | Status: AC
  Administered 2022-07-23: 2.5 mg via RESPIRATORY_TRACT

## 2022-07-23 NOTE — Assessment & Plan Note (Signed)
Persistent, urinalysis within the normal range.  Recent urine culture also clear. Symptoms are most likely secondary to coughing in setting of overactive bladder.

## 2022-07-23 NOTE — Assessment & Plan Note (Signed)
Blood pressure up in office today.  This may be due to yesterday her eating a high number of all lobes to try to get her hyponatremia improved.  I encouraged her to limit water and avoid large amounts of sodium at a time.  She can follow her blood pressure at home, call if remains greater than 140/90

## 2022-07-23 NOTE — Assessment & Plan Note (Addendum)
Acute, COPD exacerbation now ongoing almost 3 weeks.  Minimal improvement with antibiotics.  Side effects to prednisone.  On exam she definitely has wheezing today.  Treated with albuterol Atrovent nebulizer which did clear her lungs some. Chest x-ray ordered to determine if additional underlying bacterial pneumonia ongoing.  Given she does not like how albuterol makes her feel we can always try Xopenex or Atrovent nebs to help with breathing.  Start Zyrtec or Xyzal for allergy possibility at night.  Can use Mucinex DM for Cough twice daily. Return and ER precautions provided.

## 2022-07-23 NOTE — Patient Instructions (Addendum)
We will call with X-ray results.  Start Zyrtec or Xyzal for allergy possibility at night.  Can use Mucinex DM for Cough twice daily.  Avoid decongestants.Marland Kitchen as can elevated blood pressure and pulse.  Decrease salt in diet.

## 2022-07-23 NOTE — Progress Notes (Signed)
Patient ID: Tiffany Orozco, female    DOB: Apr 16, 1951, 72 y.o.   MRN: [id number]  This visit was conducted in person.  BP (!) 150/61   Pulse 73   Temp 97.7 F (36.5 C) (Temporal)   Ht '4\' 11"'$  (1.499 m)   Wt 171 lb (77.6 kg)   SpO2 97%   BMI 34.54 kg/m    CC:  Chief Complaint  Patient presents with   Ear Fullness   Cough   Back Pain    RIght Between Shoulder Blades   Wheezing   Urinary Urgency   Headache    Subjective:   [name] is a 72 y.o. female presenting on 07/23/2022 for Ear Fullness, Cough, Back Pain (RIght Between Shoulder Blades), Wheezing, Urinary Urgency, and Headache  She was seen for acute cough February 9, note reviewed in detail.  Treated at that time for bacterial bronchitis with azithromycin.  She did refuse prednisone taper at that time given she had side effects.  She also used albuterol as needed for shortness of breath and wheeze.  Prescription cough suppressant was prescribed. At that office visit, she had also noted low back pain and increasing baseline urinary frequency.  Urinalysis was concerning but urine culture was clear.  Today she continues to report ear fullness, cough, wheezing and headache. She has noted back pain between her shoulder blades predominantly on the right.  She reports she has less chest congestion and cough.   Still with chest tightness and wheeze at night.  She took 3 days of prednisone.. but felt everything felt worse.. was irritable and HA.  No fever.   She says that albuterol irritated throat... so she stopped that as well.     Relevant past medical, surgical, family and social history reviewed and updated as indicated. Interim medical history since our last visit reviewed. Allergies and medications reviewed and updated. Outpatient Medications Prior to Visit  Medication Sig Dispense Refill   Cholecalciferol (VITAMIN D3) 125 MCG (5000 UT) TABS Take 5,000 Units by mouth daily.     dipyridamole-aspirin  (AGGRENOX) 200-25 MG 12hr capsule Take 1 capsule by mouth 2 (two) times daily. 60 capsule 0   ezetimibe (ZETIA) 10 MG tablet Take 1 tablet (10 mg total) by mouth daily. 90 tablet 3   fluocinonide (LIDEX) 0.05 % external solution APPLY TO SCALP AS DIRECTED AS NEEDED     fluticasone (FLONASE) 50 MCG/ACT nasal spray SPRAY 2 SPRAYS INTO EACH NOSTRIL EVERY DAY 48 mL 11   ketoconazole (NIZORAL) 2 % shampoo APPLY 1 APPLICATION TOPICALLY 2 (TWO) TIMES A WEEK.     pantoprazole (PROTONIX) 40 MG tablet Take 1 tablet (40 mg total) by mouth 2 (two) times daily. 90 tablet 3   sucralfate (CARAFATE) 1 g tablet Take 1 tablet (1 g total) by mouth 3 (three) times daily as needed. 90 tablet 3   thyroid (ARMOUR THYROID) 60 MG tablet Take 1 tablet daily 90 tablet 3   triamcinolone cream (KENALOG) 0.1 % APPLY A SMALL AMOUNT TO AFFECTED AREA TWICE A DAY AS NEED FOR ITCHING/INFLAMMATION.     albuterol (VENTOLIN HFA) 108 (90 Base) MCG/ACT inhaler Inhale 2 puffs into the lungs every 6 (six) hours as needed for wheezing or shortness of breath. 8 g 0   azithromycin (ZITHROMAX) 250 MG tablet 2 tab po x 1 day then 1 tab po daily 6 tablet 0   benzonatate (TESSALON) 200 MG capsule Take 1 capsule (200 mg total) by mouth  2 (two) times daily as needed for cough. 20 capsule 0   guaiFENesin-codeine (ROBITUSSIN AC) 100-10 MG/5ML syrup Take 5-10 mLs by mouth at bedtime as needed for cough. 180 mL 0   predniSONE (DELTASONE) 20 MG tablet 3 tabs by mouth daily x 3 days, then 2 tabs by mouth daily x 2 days then 1 tab by mouth daily x 2 days 15 tablet 0   No facility-administered medications prior to visit.     Per HPI unless specifically indicated in ROS section below Review of Systems  Constitutional:  Negative for fatigue, fever and unexpected weight change.  HENT:  Positive for sneezing. Negative for congestion, ear pain, sinus pressure, sore throat and trouble swallowing.   Eyes:  Negative for pain and itching.  Respiratory:   Positive for cough, shortness of breath and wheezing.   Cardiovascular:  Negative for chest pain, palpitations and leg swelling.  Gastrointestinal:  Negative for abdominal pain, blood in stool, constipation, diarrhea and nausea.  Genitourinary:  Negative for difficulty urinating, dysuria, hematuria, menstrual problem and vaginal discharge.  Skin:  Negative for rash.  Neurological:  Negative for syncope, weakness, light-headedness, numbness and headaches.  Psychiatric/Behavioral:  Negative for confusion and dysphoric mood. The patient is not nervous/anxious.    Objective:  BP (!) 150/61   Pulse 73   Temp 97.7 F (36.5 C) (Temporal)   Ht '4\' 11"'$  (1.499 m)   Wt 171 lb (77.6 kg)   SpO2 97%   BMI 34.54 kg/m   Wt Readings from Last 3 Encounters:  07/23/22 171 lb (77.6 kg)  07/09/22 168 lb (76.2 kg)  06/09/22 167 lb 8 oz (76 kg)      Physical Exam Constitutional:      General: She is not in acute distress.    Appearance: Normal appearance. She is well-developed. She is not ill-appearing or toxic-appearing.  HENT:     Head: Normocephalic.     Right Ear: Hearing, tympanic membrane, ear canal and external ear normal. Tympanic membrane is not erythematous, retracted or bulging.     Left Ear: Hearing, tympanic membrane, ear canal and external ear normal. Tympanic membrane is not erythematous, retracted or bulging.     Nose: Congestion present. No mucosal edema or rhinorrhea.     Right Turbinates: Swollen.     Left Turbinates: Swollen.     Right Sinus: No maxillary sinus tenderness or frontal sinus tenderness.     Left Sinus: No maxillary sinus tenderness or frontal sinus tenderness.     Mouth/Throat:     Pharynx: Uvula midline.  Eyes:     General: Lids are normal. Lids are everted, no foreign bodies appreciated.     Conjunctiva/sclera: Conjunctivae normal.     Pupils: Pupils are equal, round, and reactive to light.  Neck:     Thyroid: No thyroid mass or thyromegaly.     Vascular: No  carotid bruit.     Trachea: Trachea normal.  Cardiovascular:     Rate and Rhythm: Normal rate and regular rhythm.     Pulses: Normal pulses.     Heart sounds: Normal heart sounds, S1 normal and S2 normal. No murmur heard.    No friction rub. No gallop.  Pulmonary:     Effort: Pulmonary effort is normal. No tachypnea or respiratory distress.     Breath sounds: Wheezing and rhonchi present. No decreased breath sounds or rales.  Abdominal:     General: Bowel sounds are normal.     Palpations: Abdomen  is soft.     Tenderness: There is no abdominal tenderness.  Musculoskeletal:     Cervical back: Normal range of motion and neck supple.  Skin:    General: Skin is warm and dry.     Findings: No rash.  Neurological:     Mental Status: She is alert.  Psychiatric:        Mood and Affect: Mood is not anxious or depressed.        Speech: Speech normal.        Behavior: Behavior normal. Behavior is cooperative.        Thought Content: Thought content normal.        Judgment: Judgment normal.       Results for orders placed or performed in visit on 07/23/22  POCT Urinalysis Dipstick (Automated)  Result Value Ref Range   Color, UA Yellow    Clarity, UA Clear    Glucose, UA Negative Negative   Bilirubin, UA Negative    Ketones, UA Negative    Spec Grav, UA 1.020 1.010 - 1.025   Blood, UA Trace    pH, UA 6.0 5.0 - 8.0   Protein, UA Positive (A) Negative   Urobilinogen, UA 0.2 0.2 or 1.0 E.U./dL   Nitrite, UA Negative    Leukocytes, UA Negative Negative    Assessment and Plan  Acute cough Assessment & Plan: Acute, COPD exacerbation now ongoing almost 3 weeks.  Minimal improvement with antibiotics.  Side effects to prednisone.  On exam she definitely has wheezing today.  Treated with albuterol Atrovent nebulizer which did clear her lungs some. Chest x-ray ordered to determine if additional underlying bacterial pneumonia ongoing.  Given she does not like how albuterol makes her feel  we can always try Xopenex or Atrovent nebs to help with breathing.  Start Zyrtec or Xyzal for allergy possibility at night.  Can use Mucinex DM for Cough twice daily. Return and ER precautions provided.  Orders: -     DG Chest 2 View; Future -     Albuterol Sulfate -     Ipratropium Bromide  Urinary frequency Assessment & Plan: Persistent, urinalysis within the normal range.  Recent urine culture also clear. Symptoms are most likely secondary to coughing in setting of overactive bladder.  Orders: -     POCT Urinalysis Dipstick (Automated)  Upper back pain    Addendum: Chest x-ray not yet reviewed by radiologist.  Upon my review I see no sign of acute pneumonia, fluid or pneumothorax.  Contacted patient to let her know results   She states she feels significantly better after the nebulizer treatment.. couhged up mucus and does not want to take anything else. [name], MD

## 2022-07-27 ENCOUNTER — Telehealth: Payer: Self-pay | Admitting: Family Medicine

## 2022-07-27 NOTE — Telephone Encounter (Signed)
Form for PA completed but they will definitely deny it as Medicare does not cover NON FDA approved meds.

## 2022-07-27 NOTE — Telephone Encounter (Addendum)
Original Appeal for Armour Thyroid was denied.  Patient is asking that we submit a 2nd appeal.  Paperwork placed in Tiffany Orozco in box to review and submit 2nd appeal if appropriate.  Appeal was denied because Armour Thyroid is not a FDA approved medication.  I advised Tiffany Orozco that I didn't think a second appeal would get approved either due to it not being a FDA approved drug.  I gave her options to use a GoodRx coupon and buy the Armour thyroid at [employer] for $39.11 a month or change thyroid medications.  Patient is adamant about Tiffany Orozco calling and doing a 2nd appeal.

## 2022-07-27 NOTE — Telephone Encounter (Signed)
Appeal Form faxed to [employer] at [phone].

## 2022-07-27 NOTE — Telephone Encounter (Signed)
Type of forms received:Medical form  Routed [id number] Pool  Paperwork received by :  Gwynn Burly   Individual made aware of 3-5 business day turn around (Y/N):Y  Form completed and patient made aware of charges(Y/N):Y   Faxed to :   Form location: Place in [facility] folder

## 2022-07-30 ENCOUNTER — Telehealth: Payer: Self-pay | Admitting: Family Medicine

## 2022-07-30 DIAGNOSIS — E039 Hypothyroidism, unspecified: Secondary | ICD-10-CM

## 2022-07-30 NOTE — Telephone Encounter (Signed)
Tiffany Orozco notified by telephone that the 2nd appeal for the Armour Thyroid was also denied.  Again I went over her options of trying the levothyroxine again or paying our of pocket for the Armour Thyroid with a GoodRx coupon.  Patient still has a 30 day supply of her current thyroid medication so she will think about what she wants to do and call us back if she wants an Rx for the levothyroxine sent in.  She did ask if I thought she could decrease her dose or stop in all together and I advised that I didn't think Dr. Diona Browner would recommend doing that and decreasing the Armour Thyroid dose would not change cost of medication but by a couple of dollars.  FYI to Dr. Diona Browner.

## 2022-07-30 NOTE — Telephone Encounter (Signed)
[employer] called to let [place] about the appeal for the armor thyroid was unfavorable (denied). [employer] stated they send ppw to our office with more info about denial. Call back # [phone]

## 2022-07-30 NOTE — Telephone Encounter (Signed)
Noted.  Agreed this is not a medication you can stop

## 2022-08-09 NOTE — Telephone Encounter (Signed)
Pt called stating she was willing to stop Armour Tyroid if needed & started the new meds Dr. Diona Browner wanted her to start. Call back # [phone]

## 2022-08-11 MED ORDER — LEVOTHYROXINE SODIUM 75 MCG PO TABS: 75.0000 ug | ORAL_TABLET | Freq: Every day | ORAL | 3 refills | Status: DC

## 2022-08-11 NOTE — Telephone Encounter (Signed)
Sent new prescription for levothyroxine 75 mcg p.o. daily to pharmacy.  Please have her schedule TSH, free T3 and T4 for in 4 to 6 weeks after starting.

## 2022-08-11 NOTE — Addendum Note (Signed)
Addended by: [name] E on: 08/11/2022 01:39 PM   Modules accepted: Orders

## 2022-08-11 NOTE — Telephone Encounter (Signed)
Left message for Ms. Cleary to return my call. 

## 2022-08-11 NOTE — Telephone Encounter (Signed)
Ms. Hodgson notified as instructed by telephone.  Lab appointments scheduled for 09/09/2022 at 8:00 am to recheck thyroid levels.  Future orders in Epic.

## 2022-08-20 ENCOUNTER — Other Ambulatory Visit: Payer: Self-pay | Admitting: Family Medicine

## 2022-09-02 ENCOUNTER — Ambulatory Visit (INDEPENDENT_AMBULATORY_CARE_PROVIDER_SITE_OTHER)
Admission: RE | Admit: 2022-09-02 | Discharge: 2022-09-02 | Disposition: A | Discharge status: Home / Self Care | Payer: 59 | Source: Ambulatory Visit | Attending: Family Medicine | Admitting: Family Medicine

## 2022-09-02 ENCOUNTER — Ambulatory Visit (INDEPENDENT_AMBULATORY_CARE_PROVIDER_SITE_OTHER): Payer: 59 | Admitting: Family Medicine

## 2022-09-02 ENCOUNTER — Encounter: Payer: Self-pay | Admitting: Family Medicine

## 2022-09-02 VITALS — BP 130/80 | HR 60 | Temp 98.5°F | Ht 59.0 in | Wt 172.8 lb

## 2022-09-02 DIAGNOSIS — M5441 Lumbago with sciatica, right side: Secondary | ICD-10-CM | POA: Diagnosis not present

## 2022-09-02 DIAGNOSIS — G8929 Other chronic pain: Secondary | ICD-10-CM

## 2022-09-02 DIAGNOSIS — M5442 Lumbago with sciatica, left side: Secondary | ICD-10-CM

## 2022-09-02 DIAGNOSIS — M47816 Spondylosis without myelopathy or radiculopathy, lumbar region: Secondary | ICD-10-CM | POA: Diagnosis not present

## 2022-09-02 MED ORDER — FLUOCINONIDE 0.05 % EX SOLN: CUTANEOUS | 1 refills | Status: DC

## 2022-09-02 MED ORDER — KETOCONAZOLE 2 % EX SHAM: MEDICATED_SHAMPOO | CUTANEOUS | 1 refills | Status: DC

## 2022-09-02 MED ORDER — MELOXICAM 7.5 MG PO TABS: 7.5000 mg | ORAL_TABLET | Freq: Every day | ORAL | 0 refills | Status: DC

## 2022-09-02 MED ORDER — TRIAMCINOLONE ACETONIDE 0.1 % EX CREA: TOPICAL_CREAM | CUTANEOUS | 1 refills | Status: DC

## 2022-09-02 NOTE — Assessment & Plan Note (Signed)
Chronic, pain in hips legs and back is most likely stemming from arthritis and spine.  Will evaluate with plain x-ray.  She is on Aggrenox and is history of gastritis.  Encouraged her not to use over-the-counter nonsteroidal anti-inflammatories.  We will try low-dose meloxicam on an intermittent basis.  Instructed patient to stop if any stomach upset.
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
## 2022-09-02 NOTE — Patient Instructions (Signed)
Stop OTC pain medications.. especially ibuprofen/ naproxen.  Can use tylenol for pain.  Can in a limited fashion use meloxicam 7.5 mg daily.  We will call with X-ray results.

## 2022-09-09 ENCOUNTER — Encounter: Payer: Self-pay | Admitting: *Deleted

## 2022-09-09 ENCOUNTER — Other Ambulatory Visit (INDEPENDENT_AMBULATORY_CARE_PROVIDER_SITE_OTHER): Payer: 59

## 2022-09-09 DIAGNOSIS — E039 Hypothyroidism, unspecified: Secondary | ICD-10-CM | POA: Diagnosis not present

## 2022-09-09 LAB — TSH: TSH: 3.46 u[IU]/mL (ref 0.35–5.50)

## 2022-09-09 LAB — T4, FREE: Free T4: 1.04 ng/dL (ref 0.60–1.60)

## 2022-09-09 LAB — T3, FREE: T3, Free: 2.8 pg/mL (ref 2.3–4.2)

## 2022-09-14 ENCOUNTER — Other Ambulatory Visit: Payer: Self-pay | Admitting: Family Medicine

## 2022-09-14 MED ORDER — MELOXICAM 7.5 MG PO TABS: 7.5000 mg | ORAL_TABLET | Freq: Two times a day (BID) | ORAL | 0 refills | Status: DC | PRN

## 2022-09-14 NOTE — Telephone Encounter (Signed)
Lab and back x-ray results discussed with patient via telephone.  She states she still is having the pain but the low dose Meloxicam is helping.  She is asking that since it is a low dose, would it be okay for her to take it bid prn.  She states she would only take it if she needs it.  Will need refill if this it okay with new dosing instructions and quantity.  If okay, please sign order below.

## 2022-09-14 NOTE — Telephone Encounter (Signed)
Patient contacted the office regarding recent labs/ xray results. Requested as call back whenever possible in order to go over these results. Please advise [phone].

## 2022-09-29 ENCOUNTER — Other Ambulatory Visit: Payer: Self-pay | Admitting: Family Medicine

## 2022-09-29 NOTE — Telephone Encounter (Signed)
Last office visit 09/02/2022 for hip/back pain.  Last refilled 09/14/2022 for #60 with no refills.  Next Appt:  No future appointments with PCP.

## 2022-10-11 ENCOUNTER — Other Ambulatory Visit: Payer: Self-pay | Admitting: Family Medicine

## 2022-10-11 NOTE — Telephone Encounter (Signed)
Last office visit 09/02/22 for back pain.  Last refilled 09/29/22 for #30 with no refills.  Next Appt: No future appointments with PCP.

## 2022-10-20 NOTE — H&P (Signed)
Pre-Procedure H&P   Patient ID: Tiffany Orozco is a 72 y.o. female.  Gastroenterology Provider: Jaynie Collins, DO  PCP: Excell Seltzer, MD  Date: 10/21/2022  HPI Ms. Tiffany Orozco is a 72 y.o. female who presents today for Colonoscopy for Colorectal cancer screening .  Patient last underwent colonoscopy in 2011 which was normal per patient.  She reports she moves her bowels regularly without melena or hematochezia  Current Aggrenox use has been held for the procedure.  She was cleared by her physician Dr. Ermalene Searing for endoscopy/sedation  Positive tobacco use.  Sister with colorectal cancer and sister with esophageal cancer  Creatinine 0.6 hemoglobin 15.1 MCV 92 platelets 277,000   Past Medical History:  Diagnosis Date   Allergy    Anxiety    COPD (chronic obstructive pulmonary disease) (HCC)    DDD (degenerative disc disease), lumbar    mild   Depression    Female rectocele without uterine prolapse    GERD (gastroesophageal reflux disease)    Hearing loss    Heart murmur    Hyperlipidemia    Hypertension    Hypothyroidism    IBS (irritable bowel syndrome)    Influenza A 06/2015   Intractable nausea and vomiting 03/01/2022   Obesity    Osteopenia    Plantar fasciitis    Pneumonia    Skin cancer of arm    Skin cancer of face    Stroke (HCC) 07/2010   no permenant deficits   Thrombocytopenia (HCC)    Tubal pregnancy     Past Surgical History:  Procedure Laterality Date   APPENDECTOMY     CHOLECYSTECTOMY  2004   COLONOSCOPY     CYST EXCISION Right    breast mole   EMGs  11/2002   atms and wrists neg   ESOPHAGEAL MANOMETRY N/A 03/17/2015   Procedure: ESOPHAGEAL MANOMETRY (EM);  Surgeon: Ruffin Frederick, MD;  Location: [facility];  Service: Gastroenterology;  Laterality: N/A;   ESOPHAGOGASTRODUODENOSCOPY N/A 03/02/2022   Procedure: ESOPHAGOGASTRODUODENOSCOPY (EGD);  Surgeon: Jaynie Collins, DO;  Location: [facility] ENDOSCOPY;   Service: Gastroenterology;  Laterality: N/A;   ESOPHAGOGASTRODUODENOSCOPY (EGD) WITH PROPOFOL N/A 05/06/2022   Procedure: ESOPHAGOGASTRODUODENOSCOPY (EGD) WITH PROPOFOL;  Surgeon: Jaynie Collins, DO;  Location: [facility] ENDOSCOPY;  Service: Gastroenterology;  Laterality: N/A;   FOOT SURGERY Right 2008, 2009, 2010   torn ligament, hammertoes   HERNIA REPAIR     TUBAL LIGATION  1975   UPPER GI ENDOSCOPY     XI ROBOTIC ASSISTED VENTRAL HERNIA N/A 04/14/2020   Procedure: XI ROBOTIC ASSISTED VENTRAL HERNIA;  Surgeon: Duanne Guess, MD;  Location: [facility] ORS;  Service: General;  Laterality: N/A;    Family History Sister with colorectal cancer and sister with esophageal cancer No h/o GI disease or malignancy  Review of Systems  Constitutional:  Negative for activity change, appetite change, chills, diaphoresis, fatigue, fever and unexpected weight change.  HENT:  Negative for trouble swallowing and voice change.   Respiratory:  Negative for shortness of breath and wheezing.   Cardiovascular:  Negative for chest pain, palpitations and leg swelling.  Gastrointestinal:  Negative for abdominal distention, abdominal pain, anal bleeding, blood in stool, constipation, diarrhea, nausea, rectal pain and vomiting.  Musculoskeletal:  Negative for arthralgias and myalgias.  Skin:  Negative for color change and pallor.  Neurological:  Negative for dizziness, syncope and weakness.  Psychiatric/Behavioral:  Negative for confusion.   All other systems reviewed and are negative.  Medications No current facility-administered medications on file prior to encounter.   Current Outpatient Medications on File Prior to Encounter  Medication Sig Dispense Refill   pantoprazole (PROTONIX) 40 MG tablet Take 1 tablet (40 mg total) by mouth 2 (two) times daily. 90 tablet 3   Cholecalciferol (VITAMIN D3) 125 MCG (5000 UT) TABS Take 5,000 Units by mouth daily.     sucralfate (CARAFATE) 1 g tablet Take 1 tablet (1  g total) by mouth 3 (three) times daily as needed. 90 tablet 3    Pertinent medications related to GI and procedure were reviewed by me with the patient prior to the procedure   Current Facility-Administered Medications:    0.9 %  sodium chloride infusion, , Intravenous, Continuous, Jaynie Collins, DO, Last Rate: 20 mL/hr at 10/21/22 0830, New Bag at 10/21/22 0830  sodium chloride 20 mL/hr at 10/21/22 0830       Allergies  Allergen Reactions   Lisinopril Shortness Of Breath   Niacin Other (See Comments)     dizziness   Welchol [Colesevelam Hcl] Diarrhea, Nausea Only, Swelling and Other (See Comments)     swelling in face   Augmentin [Amoxicillin-Pot Clavulanate] Nausea Only    GI upset with taking.   Oxycodone Hcl     Patient states this medication makes her hyper   Allergies were reviewed by me prior to the procedure  Objective   Body mass index is 33.12 kg/m. Vitals:   10/21/22 0802  BP: 136/72  Pulse: 70  Resp: 18  Temp: (!) 97.2 F (36.2 C)  TempSrc: Temporal  SpO2: 98%  Weight: 78.2 kg  Height: 5' 0.5" (1.537 m)     Physical Exam Vitals and nursing note reviewed.  Constitutional:      General: She is not in acute distress.    Appearance: Normal appearance. She is obese. She is not ill-appearing, toxic-appearing or diaphoretic.  HENT:     Head: Normocephalic and atraumatic.     Nose: Nose normal.     Mouth/Throat:     Mouth: Mucous membranes are moist.     Pharynx: Oropharynx is clear.  Eyes:     General: No scleral icterus.    Extraocular Movements: Extraocular movements intact.  Cardiovascular:     Rate and Rhythm: Normal rate and regular rhythm.     Heart sounds: Normal heart sounds. No murmur heard.    No friction rub. No gallop.  Pulmonary:     Effort: Pulmonary effort is normal. No respiratory distress.     Breath sounds: No wheezing, rhonchi or rales.     Comments: Diminished bilaterally Abdominal:     General: Bowel sounds are  normal. There is no distension.     Palpations: Abdomen is soft.     Tenderness: There is no abdominal tenderness. There is no guarding or rebound.  Musculoskeletal:     Cervical back: Neck supple.     Right lower leg: No edema.     Left lower leg: No edema.  Skin:    General: Skin is warm and dry.     Coloration: Skin is not jaundiced or pale.  Neurological:     General: No focal deficit present.     Mental Status: She is alert and oriented to person, place, and time. Mental status is at baseline.  Psychiatric:        Mood and Affect: Mood normal.        Behavior: Behavior normal.  Thought Content: Thought content normal.        Judgment: Judgment normal.      Assessment:  Tiffany Orozco is a 72 y.o. female  who presents today for Colonoscopy for colorectal cancer screening.  Plan:  Colonoscopy with possible intervention today  Colonoscopy with possible biopsy, control of bleeding, polypectomy, and interventions as necessary has been discussed with the patient/patient representative. Informed consent was obtained from the patient/patient representative after explaining the indication, nature, and risks of the procedure including but not limited to death, bleeding, perforation, missed neoplasm/lesions, cardiorespiratory compromise, and reaction to medications. Opportunity for questions was given and appropriate answers were provided. Patient/patient representative has verbalized understanding is amenable to undergoing the procedure.   Jaynie Collins, DO  [facility] Gastroenterology  Portions of the record may have been created with voice recognition software. Occasional wrong-word or 'sound-a-like' substitutions may have occurred due to the inherent limitations of voice recognition software.  Read the chart carefully and recognize, using context, where substitutions may have occurred.

## 2022-10-21 ENCOUNTER — Ambulatory Visit
Admission: RE | Admit: 2022-10-21 | Discharge: 2022-10-21 | Disposition: A | Discharge status: Home / Self Care | Payer: 59 | Attending: Gastroenterology | Admitting: Gastroenterology

## 2022-10-21 ENCOUNTER — Encounter: Payer: Self-pay | Admitting: Gastroenterology

## 2022-10-21 ENCOUNTER — Ambulatory Visit: Payer: 59 | Admitting: Certified Registered"

## 2022-10-21 ENCOUNTER — Encounter
Admission: RE | Disposition: A | Discharge status: Home / Self Care | Payer: Self-pay | Source: Home / Self Care | Attending: Gastroenterology

## 2022-10-21 DIAGNOSIS — F17210 Nicotine dependence, cigarettes, uncomplicated: Secondary | ICD-10-CM | POA: Diagnosis not present

## 2022-10-21 DIAGNOSIS — E669 Obesity, unspecified: Secondary | ICD-10-CM | POA: Diagnosis not present

## 2022-10-21 DIAGNOSIS — Z1211 Encounter for screening for malignant neoplasm of colon: Secondary | ICD-10-CM | POA: Diagnosis not present

## 2022-10-21 DIAGNOSIS — E785 Hyperlipidemia, unspecified: Secondary | ICD-10-CM | POA: Insufficient documentation

## 2022-10-21 DIAGNOSIS — K219 Gastro-esophageal reflux disease without esophagitis: Secondary | ICD-10-CM | POA: Insufficient documentation

## 2022-10-21 DIAGNOSIS — K649 Unspecified hemorrhoids: Secondary | ICD-10-CM | POA: Diagnosis not present

## 2022-10-21 DIAGNOSIS — K635 Polyp of colon: Secondary | ICD-10-CM | POA: Diagnosis not present

## 2022-10-21 DIAGNOSIS — Z6833 Body mass index (BMI) 33.0-33.9, adult: Secondary | ICD-10-CM | POA: Diagnosis not present

## 2022-10-21 DIAGNOSIS — Z85828 Personal history of other malignant neoplasm of skin: Secondary | ICD-10-CM | POA: Insufficient documentation

## 2022-10-21 DIAGNOSIS — K621 Rectal polyp: Secondary | ICD-10-CM | POA: Insufficient documentation

## 2022-10-21 DIAGNOSIS — E039 Hypothyroidism, unspecified: Secondary | ICD-10-CM | POA: Diagnosis not present

## 2022-10-21 DIAGNOSIS — D123 Benign neoplasm of transverse colon: Secondary | ICD-10-CM | POA: Diagnosis not present

## 2022-10-21 DIAGNOSIS — I10 Essential (primary) hypertension: Secondary | ICD-10-CM | POA: Insufficient documentation

## 2022-10-21 DIAGNOSIS — F17200 Nicotine dependence, unspecified, uncomplicated: Secondary | ICD-10-CM | POA: Insufficient documentation

## 2022-10-21 DIAGNOSIS — K589 Irritable bowel syndrome without diarrhea: Secondary | ICD-10-CM | POA: Diagnosis not present

## 2022-10-21 DIAGNOSIS — K640 First degree hemorrhoids: Secondary | ICD-10-CM | POA: Insufficient documentation

## 2022-10-21 DIAGNOSIS — K5289 Other specified noninfective gastroenteritis and colitis: Secondary | ICD-10-CM | POA: Diagnosis not present

## 2022-10-21 DIAGNOSIS — J449 Chronic obstructive pulmonary disease, unspecified: Secondary | ICD-10-CM | POA: Diagnosis not present

## 2022-10-21 DIAGNOSIS — I2510 Atherosclerotic heart disease of native coronary artery without angina pectoris: Secondary | ICD-10-CM | POA: Diagnosis not present

## 2022-10-21 HISTORY — PX: COLONOSCOPY WITH PROPOFOL: SHX5780

## 2022-10-21 SURGERY — COLONOSCOPY WITH PROPOFOL
Anesthesia: General

## 2022-10-21 MED ORDER — LIDOCAINE HCL (CARDIAC) PF 100 MG/5ML IV SOSY
PREFILLED_SYRINGE | INTRAVENOUS | Status: DC | PRN
  Administered 2022-10-21: 50 mg via INTRAVENOUS

## 2022-10-21 MED ORDER — PROPOFOL 500 MG/50ML IV EMUL
INTRAVENOUS | Status: DC | PRN
  Administered 2022-10-21: 150 ug/kg/min via INTRAVENOUS

## 2022-10-21 MED ORDER — SODIUM CHLORIDE 0.9 % IV SOLN: INTRAVENOUS | Status: DC

## 2022-10-21 MED ORDER — PROPOFOL 10 MG/ML IV BOLUS
INTRAVENOUS | Status: DC | PRN
  Administered 2022-10-21: 60 mg via INTRAVENOUS

## 2022-10-21 NOTE — Anesthesia Preprocedure Evaluation (Signed)
Anesthesia Evaluation  Patient identified by MRN, date of birth, ID band Patient awake    Reviewed: Allergy & Precautions, NPO status , Patient's Chart, lab work & pertinent test results  Airway Mallampati: III  TM Distance: >3 FB Neck ROM: full    Dental  (+) Missing   Pulmonary neg pulmonary ROS, shortness of breath, COPD, Current Smoker and Patient abstained from smoking.   Pulmonary exam normal  + decreased breath sounds      Cardiovascular Exercise Tolerance: Poor hypertension, Pt. on medications negative cardio ROS Normal cardiovascular exam Rhythm:Regular Rate:Normal     Neuro/Psych    Depression    negative neurological ROS  negative psych ROS   GI/Hepatic negative GI ROS, Neg liver ROS,GERD  Medicated,,  Endo/Other  negative endocrine ROSHypothyroidism    Renal/GU negative Renal ROS  negative genitourinary   Musculoskeletal  (+) Arthritis ,    Abdominal  (+) + obese  Peds negative pediatric ROS (+)  Hematology negative hematology ROS (+)   Anesthesia Other Findings Past Medical History: No date: Allergy No date: Anxiety No date: COPD (chronic obstructive pulmonary disease) (HCC) No date: DDD (degenerative disc disease), lumbar     Comment:  mild No date: Depression No date: Female rectocele without uterine prolapse No date: GERD (gastroesophageal reflux disease) No date: Hearing loss No date: Heart murmur No date: Hyperlipidemia No date: Hypertension No date: Hypothyroidism No date: IBS (irritable bowel syndrome) 06/2015: Influenza A 03/01/2022: Intractable nausea and vomiting No date: Obesity No date: Osteopenia No date: Plantar fasciitis No date: Pneumonia No date: Skin cancer of arm No date: Skin cancer of face 07/2010: Stroke [facility])     Comment:  no permenant deficits No date: Thrombocytopenia (HCC) No date: Tubal pregnancy  Past Surgical History: No date: APPENDECTOMY 2004:  CHOLECYSTECTOMY No date: COLONOSCOPY No date: CYST EXCISION; Right     Comment:  breast mole 11/2002: EMGs     Comment:  atms and wrists neg 03/17/2015: ESOPHAGEAL MANOMETRY; N/A     Comment:  Procedure: ESOPHAGEAL MANOMETRY (EM);  Surgeon: Ruffin Frederick, MD;  Location: [facility] ENDOSCOPY;  Service:               Gastroenterology;  Laterality: N/A; 03/02/2022: ESOPHAGOGASTRODUODENOSCOPY; N/A     Comment:  Procedure: ESOPHAGOGASTRODUODENOSCOPY (EGD);  Surgeon:               Jaynie Collins, DO;  Location: [facility] ENDOSCOPY;                Service: Gastroenterology;  Laterality: N/A; 05/06/2022: ESOPHAGOGASTRODUODENOSCOPY (EGD) WITH PROPOFOL; N/A     Comment:  Procedure: ESOPHAGOGASTRODUODENOSCOPY (EGD) WITH               PROPOFOL;  Surgeon: Jaynie Collins, DO;  Location:              [facility] ENDOSCOPY;  Service: Gastroenterology;  Laterality:               N/A; 2008, 2009, 2010: FOOT SURGERY; Right     Comment:  torn ligament, hammertoes No date: HERNIA REPAIR 1975: TUBAL LIGATION No date: UPPER GI ENDOSCOPY 04/14/2020: XI ROBOTIC ASSISTED VENTRAL HERNIA; N/A     Comment:  Procedure: XI ROBOTIC ASSISTED VENTRAL HERNIA;  Surgeon:              Duanne Guess, MD;  Location: [facility] ORS;  Service:  General;  Laterality: N/A;     Reproductive/Obstetrics negative OB ROS                             Anesthesia Physical Anesthesia Plan  ASA: 3  Anesthesia Plan: General   Post-op Pain Management:    Induction: Intravenous  PONV Risk Score and Plan: Propofol infusion and TIVA  Airway Management Planned: Natural Airway  Additional Equipment:   Intra-op Plan:   Post-operative Plan:   Informed Consent: I have reviewed the patients History and Physical, chart, labs and discussed the procedure including the risks, benefits and alternatives for the proposed anesthesia with the patient or authorized representative who has  indicated his/her understanding and acceptance.     Dental Advisory Given  Plan Discussed with: CRNA and Surgeon  Anesthesia Plan Comments:        Anesthesia Quick Evaluation

## 2022-10-21 NOTE — Anesthesia Postprocedure Evaluation (Signed)
Anesthesia Post Note  Patient: Tiffany Orozco  Procedure(s) Performed: COLONOSCOPY WITH PROPOFOL  Patient location during evaluation: PACU Anesthesia Type: General Level of consciousness: awake and awake and alert Pain management: satisfactory to patient Vital Signs Assessment: post-procedure vital signs reviewed and stable Respiratory status: spontaneous breathing and nonlabored ventilation Cardiovascular status: blood pressure returned to baseline Anesthetic complications: no   No notable events documented.   Last Vitals:  Vitals:   10/21/22 0802 10/21/22 0927  BP: 136/72 (!) 98/41  Pulse: 70 67  Resp: 18 15  Temp: (!) 36.2 C (!) 36 C  SpO2: 98% 98%    Last Pain:  Vitals:   10/21/22 0927  TempSrc: Temporal  PainSc: Asleep                 VAN STAVEREN,Genny Caulder

## 2022-10-21 NOTE — Interval H&P Note (Signed)
History and Physical Interval Note: Preprocedure H&P from 10/21/22  was reviewed and there was no interval change after seeing and examining the patient.  Written consent was obtained from the patient after discussion of risks, benefits, and alternatives. Patient has consented to proceed with Colonoscopy with possible intervention   10/21/2022 8:53 AM  Tiffany Orozco  has presented today for surgery, with the diagnosis of Z12.11 - Screen for colon cancer.  The various methods of treatment have been discussed with the patient and family. After consideration of risks, benefits and other options for treatment, the patient has consented to  Procedure(s): COLONOSCOPY WITH PROPOFOL (N/A) as a surgical intervention.  The patient's history has been reviewed, patient examined, no change in status, stable for surgery.  I have reviewed the patient's chart and labs.  Questions were answered to the patient's satisfaction.     Jaynie Collins

## 2022-10-21 NOTE — Op Note (Signed)
[facility] Gastroenterology Patient Name: Tiffany Orozco Procedure Date: 10/21/2022 8:48 AM MRN: [id number] Account #: [id number] Date of Birth: November 28, 1950 Admit Type: Outpatient Age: 72 Room: [facility] ENDO ROOM 1 Gender: Female Note Status: Finalized Instrument Name: Colonoscope [id number] Procedure:             Colonoscopy Indications:           Screening for colorectal malignant neoplasm Providers:             Jaynie Collins DO, DO Referring MD:          Excell Seltzer MD, MD (Referring MD) Medicines:             Monitored Anesthesia Care Complications:         No immediate complications. Estimated blood loss:                         Minimal. Procedure:             Pre-Anesthesia Assessment:                        - Prior to the procedure, a History and Physical was                         performed, and patient medications and allergies were                         reviewed. The patient is competent. The risks and                         benefits of the procedure and the sedation options and                         risks were discussed with the patient. All questions                         were answered and informed consent was obtained.                         Patient identification and proposed procedure were                         verified by the physician, the nurse, the anesthetist                         and the technician in the endoscopy suite. Mental                         Status Examination: alert and oriented. Airway                         Examination: normal oropharyngeal airway and neck                         mobility. Respiratory Examination: clear to                         auscultation. CV Examination: RRR, no murmurs, no S3  or S4. Prophylactic Antibiotics: The patient does not                         require prophylactic antibiotics. Prior                         Anticoagulants: The patient has taken antiplatelet                          medication, last dose was 7 days prior to procedure.                         ASA Grade Assessment: III - A patient with severe                         systemic disease. After reviewing the risks and                         benefits, the patient was deemed in satisfactory                         condition to undergo the procedure. The anesthesia                         plan was to use monitored anesthesia care (MAC).                         Immediately prior to administration of medications,                         the patient was re-assessed for adequacy to receive                         sedatives. The heart rate, respiratory rate, oxygen                         saturations, blood pressure, adequacy of pulmonary                         ventilation, and response to care were monitored                         throughout the procedure. The physical status of the                         patient was re-assessed after the procedure.                        After obtaining informed consent, the colonoscope was                         passed under direct vision. Throughout the procedure,                         the patient's blood pressure, pulse, and oxygen                         saturations were monitored continuously. The  Colonoscope was introduced through the anus and                         advanced to the the terminal ileum, with                         identification of the appendiceal orifice and IC                         valve. The colonoscopy was performed without                         difficulty. The patient tolerated the procedure well.                         The quality of the bowel preparation was evaluated                         using the BBPS [facility] Bowel Preparation Scale) with                         scores of: Right Colon = 3 (entire mucosa seen well                         with no residual staining, small fragments of stool  or                         opaque liquid), Transverse Colon = 3 (entire mucosa                         seen well with no residual staining, small fragments                         of stool or opaque liquid) and Left Colon = 2 (minor                         amount of residual staining, small fragments of stool                         and/or opaque liquid, but mucosa seen well). The total                         BBPS score equals 8. The quality of the bowel                         preparation was excellent. The terminal ileum,                         ileocecal valve, appendiceal orifice, and rectum were                         photographed. Findings:      The perianal and digital rectal examinations were normal. Pertinent       negatives include normal sphincter tone.      The terminal ileum appeared normal. Estimated blood loss: none.      Retroflexion in the right colon was performed.  Four sessile polyps were found in the rectum (1), sigmoid colon (2) and       transverse colon (1). The polyps were 1 to 3 mm in size. These polyps       were removed with a jumbo cold forceps. Resection and retrieval were       complete. Estimated blood loss was minimal.      Non-bleeding internal hemorrhoids were found during retroflexion. The       hemorrhoids were Grade I (internal hemorrhoids that do not prolapse).       Estimated blood loss: none.      The exam was otherwise without abnormality on direct and retroflexion       views. Impression:            - The examined portion of the ileum was normal.                        - Four 1 to 3 mm polyps in the rectum, in the sigmoid                         colon and in the transverse colon, removed with a                         jumbo cold forceps. Resected and retrieved.                        - Non-bleeding internal hemorrhoids.                        - The examination was otherwise normal on direct and                         retroflexion  views. Recommendation:        - Patient has a contact number available for                         emergencies. The signs and symptoms of potential                         delayed complications were discussed with the patient.                         Return to normal activities tomorrow. Written                         discharge instructions were provided to the patient.                        - Discharge patient to home.                        - Resume previous diet.                        - Continue present medications.                        - Resume antiplatelet medication at prior dose in 2  days. Refer to managing physician for further                         adjustment of therapy.                        - Await pathology results.                        - Repeat colonoscopy for surveillance based on                         pathology results.                        - Return to referring physician as previously                         scheduled.                        - The findings and recommendations were discussed with                         the patient. Procedure Code(s):     --- Professional ---                        [phone], Colonoscopy, flexible; with biopsy, single or                         multiple Diagnosis Code(s):     --- Professional ---                        Z12.11, Encounter for screening for malignant neoplasm                         of colon                        K64.0, First degree hemorrhoids                        D12.8, Benign neoplasm of rectum                        D12.5, Benign neoplasm of sigmoid colon                        D12.3, Benign neoplasm of transverse colon (hepatic                         flexure or splenic flexure) CPT copyright 2022 American Medical Association. All rights reserved. The codes documented in this report are preliminary and upon coder review may  be revised to meet current compliance  requirements. Attending Participation:      I personally performed the entire procedure. Elfredia Nevins, DO Jaynie Collins DO, DO 10/21/2022 9:33:00 AM This report has been signed electronically. Number of Addenda: 0 Note Initiated On: 10/21/2022 8:48 AM Scope Withdrawal Time: 0 hours 15 minutes 49 seconds  Total Procedure Duration: 0 hours 21 minutes 9 seconds  Estimated Blood Loss:  Estimated blood loss was minimal.  [facility]

## 2022-10-21 NOTE — Transfer of Care (Signed)
Immediate Anesthesia Transfer of Care Note  Patient: [name]  Procedure(s) Performed: COLONOSCOPY WITH PROPOFOL  Patient Location: PACU and Endoscopy Unit  Anesthesia Type:General  Level of Consciousness: sedated  Airway & Oxygen Therapy: Patient Spontanous Breathing  Post-op Assessment: Report given to RN and Post -op Vital signs reviewed and stable  Post vital signs: Reviewed and stable  Last Vitals:  Vitals Value Taken Time  BP 98/41 10/21/22 0927  Temp 36 C 10/21/22 0927  Pulse 66 10/21/22 0929  Resp 14 10/21/22 0929  SpO2 97 % 10/21/22 0929  Vitals shown include unvalidated device data.  Last Pain:  Vitals:   10/21/22 0927  TempSrc: Temporal  PainSc: Asleep         Complications: No notable events documented.

## 2022-10-21 NOTE — Anesthesia Procedure Notes (Signed)
Procedure Name: MAC Date/Time: 10/21/2022 8:58 AM  Performed by: Cheral Bay, CRNAPre-anesthesia Checklist: Patient identified, Emergency Drugs available, Suction available, Patient being monitored and Timeout performed Patient Re-evaluated:Patient Re-evaluated prior to induction Oxygen Delivery Method: Nasal cannula Induction Type: IV induction Placement Confirmation: positive ETCO2 and CO2 detector

## 2022-10-22 ENCOUNTER — Encounter: Payer: Self-pay | Admitting: Gastroenterology

## 2022-11-01 ENCOUNTER — Other Ambulatory Visit: Payer: Self-pay | Admitting: Family Medicine

## 2022-11-20 ENCOUNTER — Other Ambulatory Visit: Payer: Self-pay | Admitting: Family Medicine

## 2023-02-07 ENCOUNTER — Ambulatory Visit: Payer: 59

## 2023-02-11 DIAGNOSIS — L570 Actinic keratosis: Secondary | ICD-10-CM | POA: Diagnosis not present

## 2023-02-11 DIAGNOSIS — L244 Irritant contact dermatitis due to drugs in contact with skin: Secondary | ICD-10-CM | POA: Diagnosis not present

## 2023-02-21 DIAGNOSIS — Z1231 Encounter for screening mammogram for malignant neoplasm of breast: Secondary | ICD-10-CM | POA: Diagnosis not present

## 2023-02-21 LAB — HM MAMMOGRAPHY

## 2023-02-22 ENCOUNTER — Encounter: Payer: Self-pay | Admitting: Family Medicine

## 2023-04-14 ENCOUNTER — Encounter: Payer: Self-pay | Admitting: Family Medicine

## 2023-04-14 ENCOUNTER — Ambulatory Visit (INDEPENDENT_AMBULATORY_CARE_PROVIDER_SITE_OTHER): Payer: 59 | Admitting: Family Medicine

## 2023-04-14 VITALS — BP 138/80 | HR 87 | Temp 98.0°F | Ht 60.5 in | Wt 172.0 lb

## 2023-04-14 DIAGNOSIS — E559 Vitamin D deficiency, unspecified: Secondary | ICD-10-CM

## 2023-04-14 DIAGNOSIS — Z720 Tobacco use: Secondary | ICD-10-CM

## 2023-04-14 DIAGNOSIS — Z0000 Encounter for general adult medical examination without abnormal findings: Secondary | ICD-10-CM

## 2023-04-14 DIAGNOSIS — F321 Major depressive disorder, single episode, moderate: Secondary | ICD-10-CM

## 2023-04-14 DIAGNOSIS — I700 Atherosclerosis of aorta: Secondary | ICD-10-CM

## 2023-04-14 DIAGNOSIS — I10 Essential (primary) hypertension: Secondary | ICD-10-CM

## 2023-04-14 DIAGNOSIS — R051 Acute cough: Secondary | ICD-10-CM | POA: Diagnosis not present

## 2023-04-14 DIAGNOSIS — Z789 Other specified health status: Secondary | ICD-10-CM | POA: Insufficient documentation

## 2023-04-14 DIAGNOSIS — E7800 Pure hypercholesterolemia, unspecified: Secondary | ICD-10-CM | POA: Diagnosis not present

## 2023-04-14 DIAGNOSIS — E538 Deficiency of other specified B group vitamins: Secondary | ICD-10-CM | POA: Diagnosis not present

## 2023-04-14 DIAGNOSIS — J439 Emphysema, unspecified: Secondary | ICD-10-CM | POA: Diagnosis not present

## 2023-04-14 DIAGNOSIS — Z8673 Personal history of transient ischemic attack (TIA), and cerebral infarction without residual deficits: Secondary | ICD-10-CM | POA: Diagnosis not present

## 2023-04-14 DIAGNOSIS — E038 Other specified hypothyroidism: Secondary | ICD-10-CM

## 2023-04-14 MED ORDER — BENZONATATE 200 MG PO CAPS: 200.0000 mg | ORAL_CAPSULE | Freq: Two times a day (BID) | ORAL | 0 refills | Status: DC | PRN

## 2023-04-14 NOTE — Progress Notes (Addendum)
Patient ID: Tiffany Orozco, female    DOB: 01-12-1951, 72 y.o.   MRN: [id number]  This visit was conducted in person.  BP 138/80 (BP Location: Left Arm, Patient Position: Sitting, Cuff Size: Normal)   Pulse 87   Temp 98 F (36.7 C) (Oral)   Ht 5' 0.5" (1.537 m)   Wt 172 lb (78 kg)   SpO2 97%   BMI 33.04 kg/m    CC:  Chief Complaint  Patient presents with   Medicare Wellness    Part 2; part 1 scheduled for 04/18/23.   Cough    And runny nose since Saturday. Patient has been taking tylenol and mucinex for sx.     Subjective:   [name] is a 72 y.o. female presenting on 04/14/2023 for Medicare Wellness (Part 2; part 1 scheduled for 04/18/23.) and Cough (And runny nose since Saturday. Patient has been taking tylenol and mucinex for sx. )  The patient presents for  complete physical and review of chronic health problems. She also has the following acute concerns today:  Cough and runny nose x 5 days.Marland Kitchen started after being outside after mowing leaves.  Bilateral ear pain, headache  No fever.  Constant  clear productive  cough. Keeping her up at night  NO SOB, no wheeze. Chest ache from coughing.  Using tylenol AM and PM  No sick  cotacts known.  Likely COPD given CT showed emphysema of lung.  She denies baseline daily cough   Using albuterol as needed.   The patient saw a LPN or RN for medicare wellness visit.  Scheduled for April 18, 2023  Prevention and wellness was reviewed in detail. Note reviewed and important notes copied below.  Hypothyroidism well-controlled  due for re-eval. Levothyroxine 75 mcg daily Lab Results  Component Value Date   TSH 3.46 09/09/2022   Due for reevaluation of cholesterol and labs. No longer taking Zetia .  Not currently on statin despite history of CVA Current smoker  Hypertension:    BP Readings from Last 3 Encounters:  04/14/23 138/80  10/21/22 (!) 157/83  09/02/22 130/80  Using medication without problems or  lightheadedness:  Chest pain with exertion: Edema: Short of breath: Average home BPs: Other issues:  MDD chronic stable control on no medication.        Patient Care Team: Excell Seltzer, MD as PCP - General   Relevant past medical, surgical, family and social history reviewed and updated as indicated. Interim medical history since our last visit reviewed. Allergies and medications reviewed and updated. Outpatient Medications Prior to Visit  Medication Sig Dispense Refill   Cholecalciferol (VITAMIN D3) 125 MCG (5000 UT) TABS Take 5,000 Units by mouth daily.     dipyridamole-aspirin (AGGRENOX) 200-25 MG 12hr capsule TAKE 1 CAPSULE BY MOUTH TWICE A DAY 180 capsule 1   fluticasone (FLONASE) 50 MCG/ACT nasal spray SPRAY 2 SPRAYS INTO EACH NOSTRIL EVERY DAY 48 mL 11   levothyroxine (SYNTHROID) 75 MCG tablet TAKE 1 TABLET BY MOUTH EVERY DAY 90 tablet 1   pantoprazole (PROTONIX) 40 MG tablet Take 1 tablet (40 mg total) by mouth 2 (two) times daily. 90 tablet 3   sucralfate (CARAFATE) 1 g tablet Take 1 tablet (1 g total) by mouth 3 (three) times daily as needed. 90 tablet 3   ezetimibe (ZETIA) 10 MG tablet TAKE 1 TABLET BY MOUTH EVERY DAY (Patient not taking: Reported on 04/14/2023) 90 tablet 3   fluocinonide (LIDEX) 0.05 %  external solution APPLY TO SCALP AS DIRECTED AS NEEDED (Patient not taking: Reported on 04/14/2023) 60 mL 1   ketoconazole (NIZORAL) 2 % shampoo APPLY 1 APPLICATION TOPICALLY 2 (TWO) TIMES A WEEK. (Patient not taking: Reported on 04/14/2023) 120 mL 1   meloxicam (MOBIC) 7.5 MG tablet TAKE 1 TABLET BY MOUTH 2 TIMES DAILY AS NEEDED FOR PAIN. (Patient not taking: Reported on 04/14/2023) 60 tablet 0   triamcinolone cream (KENALOG) 0.1 % APPLY A SMALL AMOUNT TO AFFECTED AREA TWICE A DAY AS NEED FOR ITCHING/INFLAMMATION. (Patient not taking: Reported on 04/14/2023) 30 g 1   No facility-administered medications prior to visit.     Per HPI unless specifically indicated in ROS  section below Review of Systems  Constitutional:  Negative for fatigue and fever.  HENT:  Negative for congestion.   Eyes:  Negative for pain.  Respiratory:  Negative for cough and shortness of breath.   Cardiovascular:  Negative for chest pain, palpitations and leg swelling.  Gastrointestinal:  Negative for abdominal pain.  Genitourinary:  Negative for dysuria and vaginal bleeding.  Musculoskeletal:  Negative for back pain.  Neurological:  Negative for syncope, light-headedness and headaches.  Psychiatric/Behavioral:  Negative for dysphoric mood.     Objective:  BP 138/80 (BP Location: Left Arm, Patient Position: Sitting, Cuff Size: Normal)   Pulse 87   Temp 98 F (36.7 C) (Oral)   Ht 5' 0.5" (1.537 m)   Wt 172 lb (78 kg)   SpO2 97%   BMI 33.04 kg/m   Wt Readings from Last 3 Encounters:  04/14/23 172 lb (78 kg)  10/21/22 172 lb 6.4 oz (78.2 kg)  09/02/22 172 lb 12.8 oz (78.4 kg)      Physical Exam Vitals and nursing note reviewed.  Constitutional:      General: She is not in acute distress.    Appearance: Normal appearance. She is well-developed. She is not ill-appearing or toxic-appearing.  HENT:     Head: Normocephalic.     Right Ear: Hearing, tympanic membrane, ear canal and external ear normal. Tympanic membrane is not erythematous, retracted or bulging.     Left Ear: Hearing, tympanic membrane, ear canal and external ear normal. Tympanic membrane is not erythematous, retracted or bulging.     Nose: Mucosal edema and rhinorrhea present.     Right Sinus: No maxillary sinus tenderness or frontal sinus tenderness.     Left Sinus: No maxillary sinus tenderness or frontal sinus tenderness.     Mouth/Throat:     Mouth: Oropharynx is clear and moist and mucous membranes are normal.     Pharynx: Uvula midline.  Eyes:     General: Lids are normal. Lids are everted, no foreign bodies appreciated.     Extraocular Movements: EOM normal.     Conjunctiva/sclera: Conjunctivae  normal.     Pupils: Pupils are equal, round, and reactive to light.  Neck:     Thyroid: No thyroid mass or thyromegaly.     Vascular: No carotid bruit.     Trachea: Trachea normal.  Cardiovascular:     Rate and Rhythm: Normal rate and regular rhythm.     Pulses: Normal pulses.     Heart sounds: Normal heart sounds, S1 normal and S2 normal. No murmur heard.    No friction rub. No gallop.  Pulmonary:     Effort: Pulmonary effort is normal. No tachypnea or respiratory distress.     Breath sounds: Normal breath sounds. No decreased breath sounds, wheezing,  rhonchi or rales.  Abdominal:     General: Bowel sounds are normal. There is no distension or abdominal bruit.     Palpations: Abdomen is soft. There is no fluid wave or mass.     Tenderness: There is no abdominal tenderness. There is no guarding or rebound.     Hernia: No hernia is present.  Musculoskeletal:     Cervical back: Normal range of motion and neck supple.  Lymphadenopathy:     Cervical: No cervical adenopathy.  Skin:    General: Skin is warm, dry and intact.     Findings: No rash.  Neurological:     Mental Status: She is alert.     Cranial Nerves: No cranial nerve deficit.     Sensory: No sensory deficit.  Psychiatric:        Mood and Affect: Mood is not anxious or depressed.        Speech: Speech normal.        Behavior: Behavior normal. Behavior is cooperative.        Cognition and Memory: Cognition and memory normal.        Judgment: Judgment normal.       Results for orders placed or performed in visit on 02/22/23  [facility] MAMMOGRAPHY  Result Value Ref Range   HM Mammogram 0-4 Bi-Rad 0-4 Bi-Rad, Self Reported Normal    Assessment and Plan The patient's preventative maintenance and recommended screening tests for an annual wellness exam were reviewed in full today. Brought up to date unless services declined.  Counselled on the importance of diet, exercise, and its role in overall health and mortality. The  patient's FH and SH was reviewed, including their home life, tobacco status, and drug and alcohol status.   Vaccines: Due for flu shot, shingles vaccine and tetanus.  Completed pneumonia not indicated vaccine Pap/DVE:  not indicated Mammo: February 21, 2023 normal, repeat in 1 year Bone Density: Osteopenia due for reevaluation in 2025 Colon:  09/2021 repeat  possible in 10 years. Smoking Status: Current smoker recommended lung cancer screening program, she has had unremarkable CT chest in 2022, 2023 and is due ETOH/ drug use: None/none  Hep C: Done      Routine general medical examination at a health care facility  Acute cough Assessment & Plan: Acute, possible COPD exacerbation from allergies versus viral infection.  No clear sign of bacterial superinfection Can use albuterol inhaler as needed. Rest and fluids.  Use Mucinex dm twice daily.  Prescription for benzonatate 200 mg p.o. twice daily sent to pharmacy to use as needed for cough. She is not currently interested in prednisone taper.  Return and ER precautions provided.  She will call if she is not improving as expected over the next 3 to 5 days for consideration of antibiotics.  If severe shortness of breath ,she will go to the emergency room.   Other specified hypothyroidism Assessment & Plan: Chronic,  Due for reevaluation on levothyroxine 75 mcg daily.  No longer on Armour Thyroid  Orders: -     TSH; Future -     T4, free; Future -     T3, free; Future  HYPERCHOLESTEROLEMIA Assessment & Plan: Chronic, improved control back on zetia 10 mg daily.  LDL almost at goal < 70 at 82 Intolerant of statins... indicated give CAD history   Orders: -     Lipid panel; Future -     Comprehensive metabolic panel; Future  Primary hypertension Assessment & Plan: Stable,  chronic.  Continue current medication.  Not currently on medication   Pulmonary emphysema, unspecified emphysema type [facility]) Assessment & Plan: Noted  incidentally on CT lung cancer screening.  She is a current smoker and has been encouraged to quit but is precontemplative.    We discussed possible treatment of emphysema COPD but she states she is minimally symptomatic without any shortness of breath and less walking long distances.  No daily cough, she reports infrequent infections.   Depression, major, single episode, moderate (HCC) Assessment & Plan: Chronic, well-controlled on no medication   History of CVA (cerebrovascular accident) Assessment & Plan: Chronic, high risk for recurrence given high cholesterol and current smoker.  Not on statin medication Continued on Aggrenox 20/25 mg p.o. twice daily   Tobacco abuse Assessment & Plan: Chronic, current everyday smoker precontemplative for sensation.  Discussed importance in quitting smoking.   Aortic atherosclerosis (HCC) Assessment & Plan: Statin indicated, patient has not tolerated statins in the past.   Statin intolerance  Vitamin D deficiency -     VITAMIN D 25 Hydroxy (Vit-D Deficiency, Fractures); Future  Vitamin B12 deficiency -     Vitamin B12; Future  Other orders -     Benzonatate; Take 1 capsule (200 mg total) by mouth 2 (two) times daily as needed for cough.  Dispense: 20 capsule; Refill: 0     Return in about 1 week (around 04/21/2023) for lab only, RN visit for flu shot.   Kerby Nora, MD

## 2023-04-14 NOTE — Assessment & Plan Note (Addendum)
Chronic, improved control back on zetia 10 mg daily.  LDL almost at goal < 70 at 84 Intolerant of statins... indicated give CAD history

## 2023-04-14 NOTE — Assessment & Plan Note (Signed)
Chronic, high risk for recurrence given high cholesterol and current smoker.  Not on statin medication Continued on Aggrenox 20/25 mg p.o. twice daily

## 2023-04-14 NOTE — Assessment & Plan Note (Signed)
Acute, possible COPD exacerbation from allergies versus viral infection.  No clear sign of bacterial superinfection Can use albuterol inhaler as needed. Rest and fluids.  Use Mucinex dm twice daily.  Prescription for benzonatate 200 mg p.o. twice daily sent to pharmacy to use as needed for cough. She is not currently interested in prednisone taper.  Return and ER precautions provided.  She will call if she is not improving as expected over the next 3 to 5 days for consideration of antibiotics.  If severe shortness of breath ,she will go to the emergency room.

## 2023-04-14 NOTE — Assessment & Plan Note (Signed)
Chronic, well controlled on no medication 

## 2023-04-14 NOTE — Patient Instructions (Addendum)
Start Zyrtec at night for allergy component.  Continue flonase 2 sprays per nostril daily.  Can use  Tessalon Perles twice daily for cough as needed.  Start Mucinex DM twice daily.  Call if not improving as expected.

## 2023-04-14 NOTE — Assessment & Plan Note (Addendum)
Chronic,  Due for reevaluation on levothyroxine 75 mcg daily.  No longer on Armour Thyroid

## 2023-04-14 NOTE — Assessment & Plan Note (Signed)
Chronic, current everyday smoker precontemplative for sensation.  Discussed importance in quitting smoking.

## 2023-04-14 NOTE — Assessment & Plan Note (Addendum)
Noted incidentally on CT lung cancer screening.  She is a current smoker and has been encouraged to quit but is precontemplative.    We discussed possible treatment of emphysema COPD but she states she is minimally symptomatic without any shortness of breath and less walking long distances.  No daily cough, she reports infrequent infections.

## 2023-04-14 NOTE — Assessment & Plan Note (Signed)
Statin indicated, patient has not tolerated statins in the past.

## 2023-04-14 NOTE — Assessment & Plan Note (Signed)
Stable, chronic.  Continue current medication.  Not currently on medication

## 2023-04-18 ENCOUNTER — Ambulatory Visit (INDEPENDENT_AMBULATORY_CARE_PROVIDER_SITE_OTHER): Payer: 59

## 2023-04-18 VITALS — Ht 60.5 in | Wt 172.0 lb

## 2023-04-18 DIAGNOSIS — Z0000 Encounter for general adult medical examination without abnormal findings: Secondary | ICD-10-CM

## 2023-04-18 NOTE — Progress Notes (Signed)
Subjective:   Tiffany Orozco is a 72 y.o. female who presents for Medicare Annual (Subsequent) preventive examination.  Visit Complete: Virtual I connected with  [name] on 04/18/23 by a audio enabled telemedicine application and verified that I am speaking with the correct person using two identifiers.  Patient Location: Home  Provider Location: Home Office  I discussed the limitations of evaluation and management by telemedicine. The patient expressed understanding and agreed to proceed.  Vital Signs: Because this visit was a virtual/telehealth visit, some criteria may be missing or patient reported. Any vitals not documented were not able to be obtained and vitals that have been documented are patient reported.  Patient Medicare AWV questionnaire was completed by the patient on (not done); I have confirmed that all information answered by patient is correct and no changes since this date. Cardiac Risk Factors include: advanced age (>23men, >63 women);hypertension;obesity (BMI >30kg/m2);smoking/ tobacco exposure    Objective:    Today's Vitals   04/18/23 0843  Weight: 172 lb (78 kg)  Height: 5' 0.5" (1.537 m)   Body mass index is 33.04 kg/m.     04/18/2023    8:57 AM 05/06/2022   11:49 AM 04/14/2022    9:05 AM 03/01/2022   12:26 AM 02/28/2022    6:55 PM 02/25/2022    6:02 PM 12/22/2021    9:41 AM  Advanced Directives  Does Patient Have a Medical Advance Directive? No No No No No No No  Would patient like information on creating a medical advance directive?   No - Patient declined Yes (Inpatient - patient requests chaplain consult to create a medical advance directive) No - Patient declined No - Patient declined     Current Medications (verified) Outpatient Encounter Medications as of 04/18/2023  Medication Sig   benzonatate (TESSALON) 200 MG capsule Take 1 capsule (200 mg total) by mouth 2 (two) times daily as needed for cough. (Patient not taking: Reported on  04/18/2023)   Cholecalciferol (VITAMIN D3) 125 MCG (5000 UT) TABS Take 5,000 Units by mouth daily.   dipyridamole-aspirin (AGGRENOX) 200-25 MG 12hr capsule TAKE 1 CAPSULE BY MOUTH TWICE A DAY   fluticasone (FLONASE) 50 MCG/ACT nasal spray SPRAY 2 SPRAYS INTO EACH NOSTRIL EVERY DAY   levothyroxine (SYNTHROID) 75 MCG tablet TAKE 1 TABLET BY MOUTH EVERY DAY   pantoprazole (PROTONIX) 40 MG tablet Take 1 tablet (40 mg total) by mouth 2 (two) times daily.   sucralfate (CARAFATE) 1 g tablet Take 1 tablet (1 g total) by mouth 3 (three) times daily as needed.   No facility-administered encounter medications on file as of 04/18/2023.    Allergies (verified) Lisinopril, Niacin, Welchol [colesevelam hcl], Augmentin [amoxicillin-pot clavulanate], and Oxycodone hcl   History: Past Medical History:  Diagnosis Date   Allergy    Anxiety    COPD (chronic obstructive pulmonary disease) (HCC)    DDD (degenerative disc disease), lumbar    mild   Depression    Female rectocele without uterine prolapse    GERD (gastroesophageal reflux disease)    Hearing loss    Heart murmur    Hyperlipidemia    Hypertension    Hypothyroidism    IBS (irritable bowel syndrome)    Influenza A 06/2015   Intractable nausea and vomiting 03/01/2022   Obesity    Osteopenia    Plantar fasciitis    Pneumonia    Skin cancer of arm    Skin cancer of face    Stroke (HCC)  07/2010   no permenant deficits   Thrombocytopenia (HCC)    Tubal pregnancy    Past Surgical History:  Procedure Laterality Date   APPENDECTOMY     CHOLECYSTECTOMY  2004   COLONOSCOPY     COLONOSCOPY WITH PROPOFOL N/A 10/21/2022   Procedure: COLONOSCOPY WITH PROPOFOL;  Surgeon: Jaynie Collins, DO;  Location: [facility] ENDOSCOPY;  Service: Gastroenterology;  Laterality: N/A;   CYST EXCISION Right    breast mole   EMGs  11/2002   atms and wrists neg   ESOPHAGEAL MANOMETRY N/A 03/17/2015   Procedure: ESOPHAGEAL MANOMETRY (EM);  Surgeon: Ruffin Frederick, MD;  Location: [facility] ENDOSCOPY;  Service: Gastroenterology;  Laterality: N/A;   ESOPHAGOGASTRODUODENOSCOPY N/A 03/02/2022   Procedure: ESOPHAGOGASTRODUODENOSCOPY (EGD);  Surgeon: Jaynie Collins, DO;  Location: [facility] ENDOSCOPY;  Service: Gastroenterology;  Laterality: N/A;   ESOPHAGOGASTRODUODENOSCOPY (EGD) WITH PROPOFOL N/A 05/06/2022   Procedure: ESOPHAGOGASTRODUODENOSCOPY (EGD) WITH PROPOFOL;  Surgeon: Jaynie Collins, DO;  Location: [facility] ENDOSCOPY;  Service: Gastroenterology;  Laterality: N/A;   FOOT SURGERY Right 2008, 2009, 2010   torn ligament, hammertoes   HERNIA REPAIR     TUBAL LIGATION  1975   UPPER GI ENDOSCOPY     XI ROBOTIC ASSISTED VENTRAL HERNIA N/A 04/14/2020   Procedure: XI ROBOTIC ASSISTED VENTRAL HERNIA;  Surgeon: Duanne Guess, MD;  Location: [facility] ORS;  Service: General;  Laterality: N/A;   Family History  Problem Relation Age of Onset   Skin cancer Father    Hypertension Mother    Lung cancer Brother    Colon cancer Sister    Esophageal cancer Sister    Ovarian cancer Sister    Stomach cancer Neg Hx    Pancreatic cancer Neg Hx    Social History   Socioeconomic History   Marital status: Legally Separated    Spouse name: Noellie Handrahan   Number of children: 2   Years of education: Not on file   Highest education level: Not on file  Occupational History   Occupation: bookkeeper    Associate Professor: UNEMPLOYED    Comment: 10/10 not working x 9 years  Tobacco Use   Smoking status: Every Day    Current packs/day: 0.75    Average packs/day: 0.8 packs/day for 31.0 years (23.3 ttl pk-yrs)    Types: Cigarettes   Smokeless tobacco: Never   Tobacco comments:    States she is not smoking during current illness 05/29/18 sb  Vaping Use   Vaping status: Never Used  Substance and Sexual Activity   Alcohol use: No    Alcohol/week: 0.0 standard drinks of alcohol   Drug use: No   Sexual activity: Yes    Birth control/protection: Post-menopausal   Other Topics Concern   Not on file  Social History Narrative   5/10 husband had open heart surgery, not working, 2 grandchildren, siblings living and healthy   Social Determinants of Health   Financial Resource Strain: Low Risk  (04/18/2023)   Overall Financial Resource Strain (CARDIA)    Difficulty of Paying Living Expenses: Not hard at all  Food Insecurity: No Food Insecurity (04/18/2023)   Hunger Vital Sign    Worried About Running Out of Food in the Last Year: Never true    Ran Out of Food in the Last Year: Never true  Transportation Needs: No Transportation Needs (04/18/2023)   PRAPARE - Administrator, Civil Service (Medical): No    Lack of Transportation (Non-Medical): No  Physical Activity: Sufficiently Active (  04/18/2023)   Exercise Vital Sign    Days of Exercise per Week: 5 days    Minutes of Exercise per Session: 30 min  Stress: No Stress Concern Present (04/18/2023)   [employer] of Occupational Health - Occupational Stress Questionnaire    Feeling of Stress : Not at all  Social Connections: Socially Isolated (04/18/2023)   Social Connection and Isolation Panel [NHANES]    Frequency of Communication with Friends and Family: More than three times a week    Frequency of Social Gatherings with Friends and Family: Once a week    Attends Religious Services: Never    Database administrator or Organizations: No    Attends Engineer, structural: Never    Marital Status: Separated    Tobacco Counseling Ready to quit: Not Answered Counseling given: Not Answered Tobacco comments: States she is not smoking during current illness 05/29/18 sb   Clinical Intake:  Pre-visit preparation completed: No  Pain : No/denies pain     BMI - recorded: 33.04 Nutritional Status: BMI > 30  Obese Nutritional Risks: None Diabetes: No  How often do you need to have someone help you when you read instructions, pamphlets, or other written materials from your  doctor or pharmacy?: 1 - Never  Interpreter Needed?: No  Comments: daughter lives with pt Information entered by :: B.Terrez Ander,LPN   Activities of Daily Living    04/18/2023    8:59 AM  In your present state of health, do you have any difficulty performing the following activities:  Hearing? 1  Vision? 0  Difficulty concentrating or making decisions? 0  Walking or climbing stairs? 0  Dressing or bathing? 0  Doing errands, shopping? 0  Preparing Food and eating ? N  Using the Toilet? N  In the past six months, have you accidently leaked urine? Y  Do you have problems with loss of bowel control? Y  Managing your Medications? N  Managing your Finances? N  Housekeeping or managing your Housekeeping? N    Patient Care Team: Excell Seltzer, MD as PCP - General  Indicate any recent Medical Services you may have received from other than [facility] providers in the past year (date may be approximate).     Assessment:   This is a routine wellness examination for Cassara.  Hearing/Vision screen Hearing Screening - Comments:: Pt says she has inner ear damage in both ears so decreased hearing in both ears Vision Screening - Comments:: Pt says her vision is good with the glasses Dr Dione Booze   Goals Addressed             This Visit's Progress    DIET - EAT MORE FRUITS AND VEGETABLES   Not on track    Increase water intake   On track    Starting 11/03/16, I will attempt to drink at least 6-8 glasses of water daily.      COMPLETED: Patient Stated   On track    Starting 12/14/2017, I will continue to take medications as prescribed.      Patient Stated   Not on track    Would like to get teeth fixed due to previous illness.       Depression Screen    04/18/2023    8:51 AM 04/14/2023   10:23 AM 07/23/2022   10:39 AM 04/14/2022    9:03 AM 08/27/2021    3:25 PM 04/09/2021   10:01 AM 02/29/2020   11:41 AM  PHQ 2/9 Scores  PHQ - 2 Score 0 0 0 0 4 0 2  PHQ- 9 Score  0 0 0 14  4     Fall Risk    04/18/2023    8:48 AM 04/14/2023   10:23 AM 07/09/2022   11:08 AM 04/14/2022    9:06 AM 04/09/2021   10:00 AM  Fall Risk   Falls in the past year? 0 0 0 0 0  Number falls in past yr: 0 0 0 0 0  Injury with Fall? 0 0 0 0 0  Risk for fall due to : No Fall Risks No Fall Risks No Fall Risks No Fall Risks No Fall Risks  Follow up Education provided;Falls prevention discussed Falls evaluation completed Falls evaluation completed Falls prevention discussed;Falls evaluation completed Falls prevention discussed    MEDICARE RISK AT HOME: Medicare Risk at Home Any stairs in or around the home?: Yes If so, are there any without handrails?: Yes Home free of loose throw rugs in walkways, pet beds, electrical cords, etc?: Yes Adequate lighting in your home to reduce risk of falls?: Yes Life alert?: No Use of a cane, walker or w/c?: Yes (cane when outside) Grab bars in the bathroom?: No Shower chair or bench in shower?: Yes Elevated toilet seat or a handicapped toilet?: No  TIMED UP AND GO:  Was the test performed?  No    Cognitive Function:    12/14/2017   10:25 AM 11/03/2016    1:06 PM  MMSE - Mini Mental State Exam  Orientation to time 5 5  Orientation to Place 5 5  Registration 3 3  Attention/ Calculation 0 0  Recall 3 2  Recall-comments  pt was unable to recall 1 of 3 words  Language- name 2 objects 0 0  Language- repeat 1 1  Language- follow 3 step command 3 3  Language- read & follow direction 0 0  Write a sentence 0 0  Copy design 0 0  Total score 20 19        04/18/2023    9:00 AM 04/14/2022    9:06 AM  6CIT Screen  What Year? 0 points 0 points  What month? 0 points 0 points  What time? 0 points 0 points  Count back from 20 0 points 0 points  Months in reverse 0 points 0 points  Repeat phrase 0 points 0 points  Total Score 0 points 0 points    Immunizations Immunization History  Administered Date(s) Administered   Fluad Quad(high Dose 65+)  03/27/2019, 04/10/2021   Influenza Split 02/18/2011, 03/03/2012   Influenza Whole 03/06/2007, 03/11/2008, 03/06/2009, 02/24/2010   Influenza, High Dose Seasonal PF 03/16/2018   Influenza,inj,Quad PF,6+ Mos 02/16/2013, 03/22/2014, 02/25/2015, 04/29/2016, 03/03/2017   PFIZER(Purple Top)SARS-COV-2 Vaccination 07/27/2019, 08/21/2019   Pneumococcal Conjugate-13 10/15/2014   Pneumococcal Polysaccharide-23 10/16/2013   Td 03/11/1997, 03/06/2009   Zoster, Live 05/18/2011    TDAP status: Up to date  Flu Vaccine status: Due, Education has been provided regarding the importance of this vaccine. Advised may receive this vaccine at local pharmacy or Health Dept. Aware to provide a copy of the vaccination record if obtained from local pharmacy or Health Dept. Verbalized acceptance and understanding.  Pneumococcal vaccine status: Up to date  Covid-19 vaccine status: Completed vaccines  Qualifies for Shingles Vaccine? Yes   Zostavax completed No   Shingrix Completed?: No.    Education has been provided regarding the importance of this vaccine. Patient has been advised to call insurance company to  determine out of pocket expense if they have not yet received this vaccine. Advised may also receive vaccine at local pharmacy or Health Dept. Verbalized acceptance and understanding.  Screening Tests Health Maintenance  Topic Date Due   COVID-19 Vaccine (3 - 2023-24 season) 04/30/2023 (Originally 01/30/2023)   Zoster Vaccines- Shingrix (1 of 2) 07/19/2023 (Originally 10/11/1998)   INFLUENZA VACCINE  08/29/2023 (Originally 12/30/2022)   Lung Cancer Screening  04/17/2024 (Originally 02/06/2023)   DTaP/Tdap/Td (3 - Tdap) 04/17/2024 (Originally 03/07/2019)   DEXA SCAN  12/18/2023   MAMMOGRAM  02/21/2024   Medicare Annual Wellness (AWV)  04/17/2024   Colonoscopy  10/20/2032   Pneumonia Vaccine 91+ Years old  Completed   Hepatitis C Screening  Completed   HPV VACCINES  Aged Out    Health Maintenance  There are  no preventive care reminders to display for this patient.   Colorectal cancer screening: Type of screening: Colonoscopy. Completed 10/21/22. Repeat every 10 years  Mammogram status: Completed 02/21/2023. Repeat every year  Bone Density status: Completed 12/18/2018. Results reflect: Bone density results: OSTEOPENIA. Repeat every 3-5 years.  Lung Cancer Screening: (Low Dose CT Chest recommended if Age 82-80 years, 20 pack-year currently smoking OR have quit w/in 15years.) does qualify.   Lung Cancer Screening Referral: no  Additional Screening:  Hepatitis C Screening: does not qualify; Completed 04/15/2015  Vision Screening: Recommended annual ophthalmology exams for early detection of glaucoma and other disorders of the eye.  Is the patient up to date with their annual eye exam?  Yes  visits in Feb Who is the provider or what is the name of the office in which the patient attends annual eye exams? Dr Dione Booze  If pt is not established with a provider, would they like to be referred to a provider to establish care? No .   Dental Screening: Recommended annual dental exams for proper oral hygiene  Diabetic Foot Exam: n/a  Community Resource Referral / Chronic Care Management: CRR required this visit?  No   CCM required this visit?  No     Plan:     I have personally reviewed and noted the following in the patient's chart:   Medical and social history Use of alcohol, tobacco or illicit drugs  Current medications and supplements including opioid prescriptions. Patient is not currently taking opioid prescriptions. Functional ability and status Nutritional status Physical activity Advanced directives List of other physicians Hospitalizations, surgeries, and ER visits in previous 12 months Vitals Screenings to include cognitive, depression, and falls Referrals and appointments  In addition, I have reviewed and discussed with patient certain preventive protocols, quality metrics,  and best practice recommendations. A written personalized care plan for preventive services as well as general preventive health recommendations were provided to patient.     Sue Lush, LPN   [id number]   After Visit Summary: (Declined) Due to this being a telephonic visit, with patients personalized plan was offered to patient but patient Declined AVS at this time   Nurse Notes: pt sts she has a head cold still (from PCP visit last week/Thursday). She relays PCP recommended nasal spray and/or steroids. She says she would like to have them sent to her pharmacy to assist in getting rid of cold.

## 2023-04-18 NOTE — Patient Instructions (Signed)
Tiffany Orozco , Thank you for taking time to come for your Medicare Wellness Visit. I appreciate your ongoing commitment to your health goals. Please review the following plan we discussed and let me know if I can assist you in the future.   Referrals/Orders/Follow-Ups/Clinician Recommendations: none  This is a list of the screening recommended for you and due dates:  Health Maintenance  Topic Date Due   COVID-19 Vaccine (3 - 2023-24 season) 04/30/2023*   Zoster (Shingles) Vaccine (1 of 2) 07/19/2023*   Flu Shot  08/29/2023*   Screening for Lung Cancer  04/17/2024*   DTaP/Tdap/Td vaccine (3 - Tdap) 04/17/2024*   DEXA scan (bone density measurement)  12/18/2023   Mammogram  02/21/2024   Medicare Annual Wellness Visit  04/17/2024   Colon Cancer Screening  10/20/2032   Pneumonia Vaccine  Completed   Hepatitis C Screening  Completed   HPV Vaccine  Aged Out  *Topic was postponed. The date shown is not the original due date.    Advanced directives: (Declined) Advance directive discussed with you today. Even though you declined this today, please call our office should you change your mind, and we can give you the proper paperwork for you to fill out.  Next Medicare Annual Wellness Visit scheduled for next year: Yes 04/18/2024 @ 8:50am telephone

## 2023-04-19 ENCOUNTER — Telehealth: Payer: Self-pay | Admitting: Family Medicine

## 2023-04-19 ENCOUNTER — Telehealth: Payer: Self-pay | Admitting: Nurse Practitioner

## 2023-04-19 ENCOUNTER — Other Ambulatory Visit: Payer: Self-pay | Admitting: Family Medicine

## 2023-04-19 ENCOUNTER — Other Ambulatory Visit (INDEPENDENT_AMBULATORY_CARE_PROVIDER_SITE_OTHER): Payer: 59

## 2023-04-19 DIAGNOSIS — E538 Deficiency of other specified B group vitamins: Secondary | ICD-10-CM | POA: Diagnosis not present

## 2023-04-19 DIAGNOSIS — E038 Other specified hypothyroidism: Secondary | ICD-10-CM

## 2023-04-19 DIAGNOSIS — E559 Vitamin D deficiency, unspecified: Secondary | ICD-10-CM

## 2023-04-19 DIAGNOSIS — E7800 Pure hypercholesterolemia, unspecified: Secondary | ICD-10-CM

## 2023-04-19 LAB — LIPID PANEL
Cholesterol: 206 mg/dL — ABNORMAL HIGH (ref 0–200)
HDL: 39.5 mg/dL (ref >39.00)
LDL Cholesterol: 116 mg/dL — ABNORMAL HIGH (ref 0–99)
NonHDL: 166.98
Total CHOL/HDL Ratio: 5
Triglycerides: 253 mg/dL — ABNORMAL HIGH (ref 0.0–149.0)
VLDL: 50.6 mg/dL — ABNORMAL HIGH (ref 0.0–40.0)

## 2023-04-19 LAB — COMPREHENSIVE METABOLIC PANEL
ALT: 19 U/L (ref 0–35)
AST: 15 U/L (ref 0–37)
Albumin: 4.5 g/dL (ref 3.5–5.2)
Alkaline Phosphatase: 97 U/L (ref 39–117)
BUN: 14 mg/dL (ref 6–23)
CO2: 29 meq/L (ref 19–32)
Calcium: 9.2 mg/dL (ref 8.4–10.5)
Chloride: 93 meq/L — ABNORMAL LOW (ref 96–112)
Creatinine, Ser: 0.65 mg/dL (ref 0.40–1.20)
GFR: 86.67 mL/min (ref >60.00)
Glucose, Bld: 94 mg/dL (ref 70–99)
Potassium: 4.3 meq/L (ref 3.5–5.1)
Sodium: 131 meq/L — ABNORMAL LOW (ref 135–145)
Total Bilirubin: 0.5 mg/dL (ref 0.2–1.2)
Total Protein: 7 g/dL (ref 6.0–8.3)

## 2023-04-19 LAB — T4, FREE: Free T4: 1.14 ng/dL (ref 0.60–1.60)

## 2023-04-19 LAB — T3, FREE: T3, Free: 3.2 pg/mL (ref 2.3–4.2)

## 2023-04-19 LAB — TSH: TSH: 3.25 u[IU]/mL (ref 0.35–5.50)

## 2023-04-19 LAB — VITAMIN B12: Vitamin B-12: 1529 pg/mL — ABNORMAL HIGH (ref 211–911)

## 2023-04-19 LAB — VITAMIN D 25 HYDROXY (VIT D DEFICIENCY, FRACTURES): VITD: 36.26 ng/mL (ref 30.00–100.00)

## 2023-04-19 NOTE — Telephone Encounter (Signed)
Pt called in stating she saw Dr. Ermalene Searing on 11/14 & was told to let our office know if her symptoms didn't leave or else a rx would be sent in for her. Pt states she's starting to feel worse than before. Pt states she's still congested, feeling stopped up & has a bad headache. Preferred pharmacy is [facility] in [place]. Call back # [phone]

## 2023-04-19 NOTE — Telephone Encounter (Signed)
Please call patient. If she has not yet tried Flonase 2 sprays per nostril daily she can.  If this does not help then I will send in a prednisone taper.  Please let me know. ? pharmacy

## 2023-04-19 NOTE — Telephone Encounter (Signed)
Sent in prescription to her local pharmacy for prednisone

## 2023-04-19 NOTE — Telephone Encounter (Signed)
Got Tiffany Orozco's Medicare annual visit to sign and the nurse noted that she was still having symptoms and that you may have mentioned a nose spray or steroids.

## 2023-04-19 NOTE — Telephone Encounter (Signed)
Last office visit 04/14/2023 for CPE.  Last refilled:  Prednisone is not on current medication list.  See phone note about illness not improving.

## 2023-04-20 NOTE — Telephone Encounter (Signed)
Patient notified by telephone that Dr. Ermalene Searing has sent in a Rx for prednisone to her pharmacy.

## 2023-04-20 NOTE — Telephone Encounter (Signed)
Spoke with Ms. Tiffany Orozco.  She states she has been using Flonase and her nasal congestion has cleared up.  It is mainly her lungs.  Patient is aware that a Rx for prednisone has been sent to her pharmacy.

## 2023-04-21 ENCOUNTER — Other Ambulatory Visit: Payer: Self-pay | Admitting: Family Medicine

## 2023-04-21 DIAGNOSIS — E7800 Pure hypercholesterolemia, unspecified: Secondary | ICD-10-CM

## 2023-04-21 MED ORDER — EZETIMIBE 10 MG PO TABS: 10.0000 mg | ORAL_TABLET | Freq: Every day | ORAL | 3 refills | Status: DC

## 2023-05-13 ENCOUNTER — Ambulatory Visit (INDEPENDENT_AMBULATORY_CARE_PROVIDER_SITE_OTHER): Payer: 59 | Admitting: Family Medicine

## 2023-05-13 ENCOUNTER — Encounter: Payer: Self-pay | Admitting: Family Medicine

## 2023-05-13 VITALS — BP 122/74 | HR 85 | Temp 98.6°F | Ht 60.5 in | Wt 174.0 lb

## 2023-05-13 DIAGNOSIS — J441 Chronic obstructive pulmonary disease with (acute) exacerbation: Secondary | ICD-10-CM

## 2023-05-13 MED ORDER — DOXYCYCLINE HYCLATE 100 MG PO TABS: 100.0000 mg | ORAL_TABLET | Freq: Two times a day (BID) | ORAL | 0 refills | Status: DC

## 2023-05-13 MED ORDER — PREDNISONE 20 MG PO TABS: ORAL_TABLET | ORAL | 0 refills | Status: DC

## 2023-05-13 NOTE — Progress Notes (Signed)
Patient ID: Tiffany Orozco, female    DOB: 05/14/1951, 72 y.o.   MRN: [id number]  This visit was conducted in person.  BP 122/74   Pulse 85   Temp 98.6 F (37 C) (Temporal)   Ht 5' 0.5" (1.537 m)   Wt 174 lb (78.9 kg)   SpO2 98%   BMI 33.42 kg/m    CC:  Chief Complaint  Patient presents with   Shortness of Breath    SOB, fatigue, drainage, cough in morning (dark phlegm), ear ache symptoms began about a month and a half ago.    PPW    Patient would like renewed handicap form filled out.     Subjective:   [name] is a 72 y.o. female presenting on 05/13/2023 for Shortness of Breath (SOB, fatigue, drainage, cough in morning (dark phlegm), ear ache symptoms began about a month and a half ago. ) and PPW (Patient would like renewed handicap form filled out. )  Seen 04/14/2023... Symptoms improved with prednisone taper , since then waxing and waning    Date of onset: 1.5 months ago Initial symptoms included  fatigue, post nasal drip Symptoms progressed to cough with dark phlegm in morning in last few days Pressure in bilateral  in ears, decreased hearing  Having some shortness of breath, daughter says she is wheeze.  No fever   Sick contacts:  none COVID testing:   none  No recent antibiotics   She has tried to treat with  flonase.  Albuterol prn helps temporarily, somewhat     Has history of emphesema/COPD. Non-smoker.       Relevant past medical, surgical, family and social history reviewed and updated as indicated. Interim medical history since our last visit reviewed. Allergies and medications reviewed and updated. Outpatient Medications Prior to Visit  Medication Sig Dispense Refill   Cholecalciferol (VITAMIN D3) 125 MCG (5000 UT) TABS Take 5,000 Units by mouth daily.     dipyridamole-aspirin (AGGRENOX) 200-25 MG 12hr capsule TAKE 1 CAPSULE BY MOUTH TWICE A DAY 180 capsule 1   ezetimibe (ZETIA) 10 MG tablet Take 1 tablet (10 mg total) by mouth  daily. 90 tablet 3   fluticasone (FLONASE) 50 MCG/ACT nasal spray SPRAY 2 SPRAYS INTO EACH NOSTRIL EVERY DAY 48 mL 11   levothyroxine (SYNTHROID) 75 MCG tablet TAKE 1 TABLET BY MOUTH EVERY DAY 90 tablet 1   pantoprazole (PROTONIX) 40 MG tablet Take 1 tablet (40 mg total) by mouth 2 (two) times daily. 90 tablet 3   sucralfate (CARAFATE) 1 g tablet Take 1 tablet (1 g total) by mouth 3 (three) times daily as needed. 90 tablet 3   benzonatate (TESSALON) 200 MG capsule Take 1 capsule (200 mg total) by mouth 2 (two) times daily as needed for cough. (Patient not taking: Reported on 05/13/2023) 20 capsule 0   predniSONE (DELTASONE) 20 MG tablet 3 TABS BY MOUTH DAILY X 3 DAYS, THEN 2 TABS BY MOUTH DAILY X 2 DAYS THEN 1 TAB BY MOUTH DAILY X 2 DAYS (Patient not taking: Reported on 05/13/2023) 15 tablet 0   No facility-administered medications prior to visit.     Per HPI unless specifically indicated in ROS section below Review of Systems  Constitutional:  Negative for fatigue and fever.  HENT:  Positive for ear pain. Negative for congestion.   Eyes:  Negative for pain.  Respiratory:  Positive for cough and shortness of breath.   Cardiovascular:  Negative for chest pain,  palpitations and leg swelling.  Gastrointestinal:  Negative for abdominal pain.  Genitourinary:  Negative for dysuria and vaginal bleeding.  Musculoskeletal:  Negative for back pain.  Neurological:  Negative for syncope, light-headedness and headaches.  Psychiatric/Behavioral:  Negative for dysphoric mood.    Objective:  BP 122/74   Pulse 85   Temp 98.6 F (37 C) (Temporal)   Ht 5' 0.5" (1.537 m)   Wt 174 lb (78.9 kg)   SpO2 98%   BMI 33.42 kg/m   Wt Readings from Last 3 Encounters:  05/13/23 174 lb (78.9 kg)  04/18/23 172 lb (78 kg)  04/14/23 172 lb (78 kg)      Physical Exam Constitutional:      General: She is not in acute distress.    Appearance: She is well-developed. She is not ill-appearing or toxic-appearing.   HENT:     Head: Normocephalic.     Right Ear: Hearing, tympanic membrane, ear canal and external ear normal. Tympanic membrane is not erythematous, retracted or bulging.     Left Ear: Hearing, tympanic membrane, ear canal and external ear normal. Tympanic membrane is not erythematous, retracted or bulging.     Nose: Mucosal edema and rhinorrhea present.     Right Sinus: No maxillary sinus tenderness or frontal sinus tenderness.     Left Sinus: No maxillary sinus tenderness or frontal sinus tenderness.     Mouth/Throat:     Pharynx: Uvula midline.  Eyes:     General: Lids are normal. Lids are everted, no foreign bodies appreciated.     Conjunctiva/sclera: Conjunctivae normal.     Pupils: Pupils are equal, round, and reactive to light.  Neck:     Thyroid: No thyroid mass or thyromegaly.     Vascular: No carotid bruit.     Trachea: Trachea normal.  Cardiovascular:     Rate and Rhythm: Normal rate and regular rhythm.     Pulses: Normal pulses.     Heart sounds: Normal heart sounds, S1 normal and S2 normal. No murmur heard.    No friction rub. No gallop.  Pulmonary:     Effort: Pulmonary effort is normal. No tachypnea or respiratory distress.     Breath sounds: Normal breath sounds. No decreased breath sounds, wheezing, rhonchi or rales.  Musculoskeletal:     Cervical back: Normal range of motion and neck supple.  Skin:    General: Skin is warm and dry.     Findings: No rash.  Neurological:     Mental Status: She is alert.  Psychiatric:        Mood and Affect: Mood is not anxious or depressed.        Speech: Speech normal.        Behavior: Behavior normal. Behavior is cooperative.        Judgment: Judgment normal.       Results for orders placed or performed in visit on 04/19/23  T3, free   Collection Time: 04/19/23  8:05 AM  Result Value Ref Range   T3, Free 3.2 2.3 - 4.2 pg/mL  T4, free   Collection Time: 04/19/23  8:05 AM  Result Value Ref Range   Free T4 1.14 0.60 -  1.60 ng/dL  TSH   Collection Time: 04/19/23  8:05 AM  Result Value Ref Range   TSH 3.25 0.35 - 5.50 uIU/mL  Vitamin B12   Collection Time: 04/19/23  8:05 AM  Result Value Ref Range   Vitamin B-12 1,529 (H) 211 -  911 pg/mL  Comprehensive metabolic panel   Collection Time: 04/19/23  8:05 AM  Result Value Ref Range   Sodium 131 (L) 135 - 145 mEq/L   Potassium 4.3 3.5 - 5.1 mEq/L   Chloride 93 (L) 96 - 112 mEq/L   CO2 29 19 - 32 mEq/L   Glucose, Bld 94 70 - 99 mg/dL   BUN 14 6 - 23 mg/dL   Creatinine, Ser 1.61 0.40 - 1.20 mg/dL   Total Bilirubin 0.5 0.2 - 1.2 mg/dL   Alkaline Phosphatase 97 39 - 117 U/L   AST 15 0 - 37 U/L   ALT 19 0 - 35 U/L   Total Protein 7.0 6.0 - 8.3 g/dL   Albumin 4.5 3.5 - 5.2 g/dL   GFR 09.60 >45.40 mL/min   Calcium 9.2 8.4 - 10.5 mg/dL  Lipid panel   Collection Time: 04/19/23  8:05 AM  Result Value Ref Range   Cholesterol 206 (H) 0 - 200 mg/dL   Triglycerides 981.1 (H) 0.0 - 149.0 mg/dL   HDL 91.47 >82.95 mg/dL   VLDL 62.1 (H) 0.0 - 30.8 mg/dL   LDL Cholesterol 657 (H) 0 - 99 mg/dL   Total CHOL/HDL Ratio 5    NonHDL 166.98   VITAMIN D 25 Hydroxy (Vit-D Deficiency, Fractures)   Collection Time: 04/19/23  8:05 AM  Result Value Ref Range   VITD 36.26 30.00 - 100.00 ng/mL    Assessment and Plan  There are no diagnoses linked to this encounter.  No follow-ups on file.   Kerby Nora, MD

## 2023-05-13 NOTE — Assessment & Plan Note (Signed)
Acute Lung exam clear to auscultation bilaterally with patient report of shortness of breath and chest tightness concern for COPD exacerbation.  Change in mucus color suggest possible bacterial infection.  Treat with doxycycline 100 mg p.o. twice daily x 10 days.  She does not like the side effects of prednisone but if her breathing is not improving she will complete a prednisone taper.  Continue Mucinex DM and nasal steroid spray.  Return ER precautions provided.  Go to the emergency room if severe shortness of breath.

## 2023-05-19 ENCOUNTER — Other Ambulatory Visit: Payer: Self-pay | Admitting: Family Medicine

## 2023-05-20 DIAGNOSIS — D485 Neoplasm of uncertain behavior of skin: Secondary | ICD-10-CM | POA: Diagnosis not present

## 2023-05-20 DIAGNOSIS — L821 Other seborrheic keratosis: Secondary | ICD-10-CM | POA: Diagnosis not present

## 2023-05-20 DIAGNOSIS — L814 Other melanin hyperpigmentation: Secondary | ICD-10-CM | POA: Diagnosis not present

## 2023-05-20 DIAGNOSIS — L570 Actinic keratosis: Secondary | ICD-10-CM | POA: Diagnosis not present

## 2023-05-24 ENCOUNTER — Other Ambulatory Visit: Payer: Self-pay | Admitting: Family Medicine

## 2023-06-04 ENCOUNTER — Other Ambulatory Visit: Payer: Self-pay | Admitting: Family Medicine

## 2023-07-06 ENCOUNTER — Ambulatory Visit (INDEPENDENT_AMBULATORY_CARE_PROVIDER_SITE_OTHER): Payer: 59 | Admitting: Podiatry

## 2023-07-06 ENCOUNTER — Ambulatory Visit (INDEPENDENT_AMBULATORY_CARE_PROVIDER_SITE_OTHER): Payer: 59

## 2023-07-06 ENCOUNTER — Encounter: Payer: Self-pay | Admitting: Podiatry

## 2023-07-06 VITALS — Ht 60.5 in | Wt 174.0 lb

## 2023-07-06 DIAGNOSIS — M25571 Pain in right ankle and joints of right foot: Secondary | ICD-10-CM | POA: Diagnosis not present

## 2023-07-06 DIAGNOSIS — S93491A Sprain of other ligament of right ankle, initial encounter: Secondary | ICD-10-CM | POA: Diagnosis not present

## 2023-07-06 DIAGNOSIS — L608 Other nail disorders: Secondary | ICD-10-CM

## 2023-07-06 DIAGNOSIS — G8929 Other chronic pain: Secondary | ICD-10-CM

## 2023-07-06 DIAGNOSIS — S93401A Sprain of unspecified ligament of right ankle, initial encounter: Secondary | ICD-10-CM
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
## 2023-07-27 ENCOUNTER — Other Ambulatory Visit (INDEPENDENT_AMBULATORY_CARE_PROVIDER_SITE_OTHER): Payer: 59

## 2023-07-27 DIAGNOSIS — E7800 Pure hypercholesterolemia, unspecified: Secondary | ICD-10-CM | POA: Diagnosis not present

## 2023-07-27 LAB — LIPID PANEL
Cholesterol: 149 mg/dL (ref 0–200)
HDL: 44.3 mg/dL (ref >39.00)
LDL Cholesterol: 78 mg/dL (ref 0–99)
NonHDL: 104.44
Total CHOL/HDL Ratio: 3
Triglycerides: 134 mg/dL (ref 0.0–149.0)
VLDL: 26.8 mg/dL (ref 0.0–40.0)

## 2023-07-27 LAB — COMPREHENSIVE METABOLIC PANEL
ALT: 17 U/L (ref 0–35)
AST: 15 U/L (ref 0–37)
Albumin: 4.3 g/dL (ref 3.5–5.2)
Alkaline Phosphatase: 86 U/L (ref 39–117)
BUN: 10 mg/dL (ref 6–23)
CO2: 24 meq/L (ref 19–32)
Calcium: 9 mg/dL (ref 8.4–10.5)
Chloride: 95 meq/L — ABNORMAL LOW (ref 96–112)
Creatinine, Ser: 0.78 mg/dL (ref 0.40–1.20)
GFR: 74.63 mL/min (ref >60.00)
Glucose, Bld: 106 mg/dL — ABNORMAL HIGH (ref 70–99)
Potassium: 4.7 meq/L (ref 3.5–5.1)
Sodium: 131 meq/L — ABNORMAL LOW (ref 135–145)
Total Bilirubin: 0.6 mg/dL (ref 0.2–1.2)
Total Protein: 7.5 g/dL (ref 6.0–8.3)

## 2023-08-01 ENCOUNTER — Ambulatory Visit: Payer: Self-pay | Admitting: Family Medicine

## 2023-08-01 NOTE — Telephone Encounter (Signed)
  Communication  Red Word that prompted transfer to Nurse Triage: Patient [phone] states face and neck is swollen, head congestion, running nose, sneezing, and left ear painful. Patient unsure if she has a fever, wheezing nor dizziness. Wants to be seen today.                Chief Complaint: Sinus pain, congestion, left ear ache. Symptoms: Above Frequency: Saturday Pertinent Negatives: Patient denies fever Disposition: [] ED /[] Urgent Care (no appt availability in office) / [x] Appointment(In office/virtual)/ []  [place] [facility]/ [] Home Care/ [] Refused Recommended Disposition /[] [place] [facility]/ []  Follow-up with PCP Additional Notes: Pt. Agrees with appointment.  Reason for Disposition  Earache  Answer Assessment - Initial Assessment Questions 1. LOCATION: "Where does it hurt?"      Face 2. ONSET: "When did the sinus pain start?"  (e.g., hours, days)      Saturday 3. SEVERITY: "How bad is the pain?"   (Scale 1-10; mild, moderate or severe)   - MILD (1-3): doesn't interfere with normal activities    - MODERATE (4-7): interferes with normal activities (e.g., work or school) or awakens from sleep   - SEVERE (8-10): excruciating pain and patient unable to do any normal activities        Moderate 4. RECURRENT SYMPTOM: "Have you ever had sinus problems before?" If Yes, ask: "When was the last time?" and "What happened that time?"      Yes 5. NASAL CONGESTION: "Is the nose blocked?" If Yes, ask: "Can you open it or must you breathe through your mouth?"     No 6. NASAL DISCHARGE: "Do you have discharge from your nose?" If so ask, "What color?"     Clear 7. FEVER: "Do you have a fever?" If Yes, ask: "What is it, how was it measured, and when did it start?"      No 8. OTHER SYMPTOMS: "Do you have any other symptoms?" (e.g., sore throat, cough, earache, difficulty breathing)     Left ear pain 9. PREGNANCY: "Is there any chance you are pregnant?" "When was your last menstrual  period?"     no  Protocols used: Sinus Pain or Congestion-A-AH

## 2023-08-02 ENCOUNTER — Ambulatory Visit: Admitting: Family

## 2023-08-02 ENCOUNTER — Ambulatory Visit (INDEPENDENT_AMBULATORY_CARE_PROVIDER_SITE_OTHER): Admitting: Family Medicine

## 2023-08-02 ENCOUNTER — Encounter: Payer: Self-pay | Admitting: Family Medicine

## 2023-08-02 VITALS — BP 140/72 | HR 67 | Temp 98.0°F | Ht 60.5 in | Wt 174.1 lb

## 2023-08-02 DIAGNOSIS — J0100 Acute maxillary sinusitis, unspecified: Secondary | ICD-10-CM | POA: Diagnosis not present

## 2023-08-02 MED ORDER — AMOXICILLIN 500 MG PO CAPS: 1000.0000 mg | ORAL_CAPSULE | Freq: Two times a day (BID) | ORAL | 0 refills | Status: DC

## 2023-08-02 NOTE — Assessment & Plan Note (Signed)
 Acute, possible allergies with bacterial super infeciton  Restart zyrtec or Xyzal at bedtime.  Continue Flonase 2 sprays per nostril.  Complete course of antibiotics.

## 2023-08-02 NOTE — Progress Notes (Signed)
 Patient ID: Tiffany Orozco, female    DOB: [birth year], 73 y.o.   MRN: [id number]  This visit was conducted in person.  BP (!) 140/72 (BP Location: Right Arm, Patient Position: Sitting, Cuff Size: Normal)   Pulse 67   Temp 98 F (36.7 C) (Temporal)   Ht 5' 0.5" (1.537 m)   Wt 174 lb 2 oz (79 kg)   SpO2 98%   BMI 33.45 kg/m    CC:  Chief Complaint  Patient presents with   Nasal Congestion    Symptoms started the end of last week   Ear Pain    Left    Neck Pain    Swelling   Sinus Drainage   Arthritis    Back and Hips    Subjective:   [name] is a 73 y.o. female presenting on 08/02/2023 for Nasal Congestion (Symptoms started the end of last week), Ear Pain (Left ), Neck Pain (Swelling), Sinus Drainage, and Arthritis (Back and Hips)   Date of onset:  6 days, worsening Initial symptoms included   ear congestion, post nasal drip, sneeze Symptoms progressed to left ear pain, left pain  Darker nasal discharge, ST  Neck sore on left  Possible low grade temp subjective   No Sob, no wheeze.   Sick contacts:  none COVID testing:   none    She has tried to treat with  flonase 2 sprays per nostril daily.     No history of chronic lung disease such as asthma or COPD. Non-smoker.     Feels like low back pain and hip bialterally.Marland Kitchen occ using BC powder.  Relevant past medical, surgical, family and social history reviewed and updated as indicated. Interim medical history since our last visit reviewed. Allergies and medications reviewed and updated. Outpatient Medications Prior to Visit  Medication Sig Dispense Refill   Cholecalciferol (VITAMIN D3) 125 MCG (5000 UT) TABS Take 5,000 Units by mouth daily.     dipyridamole-aspirin (AGGRENOX) 200-25 MG 12hr capsule TAKE 1 CAPSULE BY MOUTH TWICE A DAY 180 capsule 3   ezetimibe (ZETIA) 10 MG tablet Take 1 tablet (10 mg total) by mouth daily. 90 tablet 3   fluticasone (FLONASE) 50 MCG/ACT nasal spray SPRAY 2 SPRAYS  INTO EACH NOSTRIL EVERY DAY 48 mL 11   levothyroxine (SYNTHROID) 75 MCG tablet TAKE 1 TABLET BY MOUTH EVERY DAY 90 tablet 3   pantoprazole (PROTONIX) 40 MG tablet Take 1 tablet (40 mg total) by mouth 2 (two) times daily. 90 tablet 3   doxycycline (VIBRA-TABS) 100 MG tablet Take 1 tablet (100 mg total) by mouth 2 (two) times daily. 20 tablet 0   predniSONE (DELTASONE) 20 MG tablet 3 tabs by mouth daily x 3 days, then 2 tabs by mouth daily x 2 days then 1 tab by mouth daily x 2 days 15 tablet 0   sucralfate (CARAFATE) 1 g tablet Take 1 tablet (1 g total) by mouth 3 (three) times daily as needed. 90 tablet 3   No facility-administered medications prior to visit.     Per HPI unless specifically indicated in ROS section below Review of Systems  Constitutional:  Negative for fatigue and fever.  HENT:  Positive for congestion, ear pain, postnasal drip and sneezing.   Eyes:  Negative for pain.  Respiratory:  Negative for cough, chest tightness and shortness of breath.   Cardiovascular:  Negative for chest pain, palpitations and leg swelling.  Gastrointestinal:  Negative for abdominal pain.  Genitourinary:  Negative for dysuria and vaginal bleeding.  Musculoskeletal:  Negative for back pain.  Neurological:  Negative for syncope, light-headedness and headaches.  Psychiatric/Behavioral:  Negative for dysphoric mood.    Objective:  BP (!) 140/72 (BP Location: Right Arm, Patient Position: Sitting, Cuff Size: Normal)   Pulse 67   Temp 98 F (36.7 C) (Temporal)   Ht 5' 0.5" (1.537 m)   Wt 174 lb 2 oz (79 kg)   SpO2 98%   BMI 33.45 kg/m   Wt Readings from Last 3 Encounters:  08/02/23 174 lb 2 oz (79 kg)  07/06/23 174 lb (78.9 kg)  05/13/23 174 lb (78.9 kg)      Physical Exam Constitutional:      General: She is not in acute distress.    Appearance: Normal appearance. She is well-developed. She is not ill-appearing or toxic-appearing.  HENT:     Head: Normocephalic.     Right Ear:  Hearing, tympanic membrane, ear canal and external ear normal. Tympanic membrane is not erythematous, retracted or bulging.     Left Ear: Hearing, ear canal and external ear normal. A middle ear effusion is present. Tympanic membrane is not erythematous, retracted or bulging.     Nose: No mucosal edema or rhinorrhea.     Right Sinus: No maxillary sinus tenderness or frontal sinus tenderness.     Left Sinus: Maxillary sinus tenderness present. No frontal sinus tenderness.     Mouth/Throat:     Pharynx: Uvula midline.  Eyes:     General: Lids are normal. Lids are everted, no foreign bodies appreciated.     Conjunctiva/sclera: Conjunctivae normal.     Pupils: Pupils are equal, round, and reactive to light.  Neck:     Thyroid: No thyroid mass or thyromegaly.     Vascular: No carotid bruit.     Trachea: Trachea normal.  Cardiovascular:     Rate and Rhythm: Normal rate and regular rhythm.     Pulses: Normal pulses.     Heart sounds: Normal heart sounds, S1 normal and S2 normal. No murmur heard.    No friction rub. No gallop.  Pulmonary:     Effort: Pulmonary effort is normal. No tachypnea or respiratory distress.     Breath sounds: Normal breath sounds. No decreased breath sounds, wheezing, rhonchi or rales.  Abdominal:     General: Bowel sounds are normal.     Palpations: Abdomen is soft.     Tenderness: There is no abdominal tenderness.  Musculoskeletal:     Cervical back: Normal range of motion and neck supple.  Skin:    General: Skin is warm and dry.     Findings: No rash.  Neurological:     Mental Status: She is alert.  Psychiatric:        Mood and Affect: Mood is not anxious or depressed.        Speech: Speech normal.        Behavior: Behavior normal. Behavior is cooperative.        Thought Content: Thought content normal.        Judgment: Judgment normal.       Results for orders placed or performed in visit on 07/27/23  Lipid panel   Collection Time: 07/27/23  8:48 AM   Result Value Ref Range   Cholesterol 149 0 - 200 mg/dL   Triglycerides 161.0 0.0 - 149.0 mg/dL   HDL 96.04 >54.09 mg/dL   VLDL 81.1 0.0 - 91.4 mg/dL  LDL Cholesterol 78 0 - 99 mg/dL   Total CHOL/HDL Ratio 3    NonHDL 104.44   Comprehensive metabolic panel   Collection Time: 07/27/23  8:48 AM  Result Value Ref Range   Sodium 131 (L) 135 - 145 mEq/L   Potassium 4.7 3.5 - 5.1 mEq/L   Chloride 95 (L) 96 - 112 mEq/L   CO2 24 19 - 32 mEq/L   Glucose, Bld 106 (H) 70 - 99 mg/dL   BUN 10 6 - 23 mg/dL   Creatinine, Ser 1.61 0.40 - 1.20 mg/dL   Total Bilirubin 0.6 0.2 - 1.2 mg/dL   Alkaline Phosphatase 86 39 - 117 U/L   AST 15 0 - 37 U/L   ALT 17 0 - 35 U/L   Total Protein 7.5 6.0 - 8.3 g/dL   Albumin 4.3 3.5 - 5.2 g/dL   GFR 09.60 >45.40 mL/min   Calcium 9.0 8.4 - 10.5 mg/dL    Assessment and Plan  Acute non-recurrent maxillary sinusitis Assessment & Plan: Acute, possible allergies with bacterial super infeciton  Restart zyrtec or Xyzal at bedtime.  Continue Flonase 2 sprays per nostril.  Complete course of antibiotics.   Other orders -     Amoxicillin; Take 2 capsules (1,000 mg total) by mouth 2 (two) times daily.  Dispense: 40 capsule; Refill: 0    No follow-ups on file.   Kerby Nora, MD

## 2023-08-02 NOTE — Patient Instructions (Signed)
 Restart zyrtec or Xyzal at bedtime.  Continue Flonase 2 sprays per nostril.  Complete course of antibiotics.

## 2023-09-07 DIAGNOSIS — J301 Allergic rhinitis due to pollen: Secondary | ICD-10-CM | POA: Diagnosis not present

## 2023-09-07 DIAGNOSIS — H6983 Other specified disorders of Eustachian tube, bilateral: Secondary | ICD-10-CM | POA: Diagnosis not present

## 2023-09-07 DIAGNOSIS — H903 Sensorineural hearing loss, bilateral: Secondary | ICD-10-CM | POA: Diagnosis not present

## 2023-09-08 ENCOUNTER — Encounter: Payer: Self-pay | Admitting: *Deleted

## 2023-09-19 ENCOUNTER — Other Ambulatory Visit: Payer: Self-pay

## 2023-09-19 DIAGNOSIS — Z122 Encounter for screening for malignant neoplasm of respiratory organs: Secondary | ICD-10-CM

## 2023-09-19 DIAGNOSIS — F17210 Nicotine dependence, cigarettes, uncomplicated: Secondary | ICD-10-CM

## 2023-09-19 DIAGNOSIS — Z87891 Personal history of nicotine dependence: Secondary | ICD-10-CM

## 2023-09-20 DIAGNOSIS — H353131 Nonexudative age-related macular degeneration, bilateral, early dry stage: Secondary | ICD-10-CM | POA: Diagnosis not present

## 2023-09-20 DIAGNOSIS — H04123 Dry eye syndrome of bilateral lacrimal glands: Secondary | ICD-10-CM | POA: Diagnosis not present

## 2023-09-20 DIAGNOSIS — H2513 Age-related nuclear cataract, bilateral: Secondary | ICD-10-CM | POA: Diagnosis not present

## 2023-09-22 DIAGNOSIS — H905 Unspecified sensorineural hearing loss: Secondary | ICD-10-CM | POA: Diagnosis not present

## 2023-09-27 ENCOUNTER — Ambulatory Visit
Admission: RE | Admit: 2023-09-27 | Discharge: 2023-09-27 | Disposition: A | Discharge status: Home / Self Care | Source: Ambulatory Visit | Attending: Family Medicine | Admitting: Family Medicine

## 2023-09-27 DIAGNOSIS — Z87891 Personal history of nicotine dependence: Secondary | ICD-10-CM | POA: Insufficient documentation

## 2023-09-27 DIAGNOSIS — F17210 Nicotine dependence, cigarettes, uncomplicated: Secondary | ICD-10-CM | POA: Diagnosis not present

## 2023-09-27 DIAGNOSIS — Z122 Encounter for screening for malignant neoplasm of respiratory organs: Secondary | ICD-10-CM | POA: Insufficient documentation

## 2023-10-19 DIAGNOSIS — H04123 Dry eye syndrome of bilateral lacrimal glands: Secondary | ICD-10-CM | POA: Diagnosis not present

## 2023-10-19 DIAGNOSIS — H2513 Age-related nuclear cataract, bilateral: Secondary | ICD-10-CM | POA: Diagnosis not present

## 2023-10-21 ENCOUNTER — Other Ambulatory Visit: Payer: Self-pay

## 2023-10-21 DIAGNOSIS — Z87891 Personal history of nicotine dependence: Secondary | ICD-10-CM

## 2023-10-21 DIAGNOSIS — F17210 Nicotine dependence, cigarettes, uncomplicated: Secondary | ICD-10-CM

## 2023-10-21 DIAGNOSIS — Z122 Encounter for screening for malignant neoplasm of respiratory organs: Secondary | ICD-10-CM

## 2023-10-27 ENCOUNTER — Ambulatory Visit: Payer: Self-pay

## 2023-10-27 NOTE — Telephone Encounter (Signed)
  Chief Complaint: ear ache Symptoms:   Frequency: 2 days Pertinent Negatives: Patient denies fever, drainage Disposition: [] ED /[x] Urgent Care (no appt availability in office) / [x] Appointment(In office/virtual)/ []  [place] [facility]/ [] Home Care/ [] Refused Recommended Disposition /[] [place] [facility]/ []  Follow-up with PCP Additional Notes: Patient has been advised to go to UC today, but would like to wait to be seen in office at first available instead. She will proceed to UC if pain increases or if she begins to run a fever.  Patient has had ear ache x 48 hours.  She has been taking allergy pill and nose spray with no relief  Reason for Disposition  Earache  (Exceptions: brief ear pain of < 60 minutes duration, earache occurring during air travel  Answer Assessment - Initial Assessment Questions 1. LOCATION: "Which ear is involved?"     Left ear pain 2. ONSET: "When did the ear start hurting"      2 days 3. SEVERITY: "How bad is the pain?"  (Scale 1-10; mild, moderate or severe)   - MILD (1-3): doesn't interfere with normal activities    - MODERATE (4-7): interferes with normal activities or awakens from sleep    - SEVERE (8-10): excruciating pain, unable to do any normal activities      8 4. URI SYMPTOMS: "Do you have a runny nose or cough?"     sneezing 5. FEVER: "Do you have a fever?" If Yes, ask: "What is your temperature, how was it measured, and when did it start?"     no  7. OTHER SYMPTOMS: "Do you have any other symptoms?" (e.g., headache, stiff neck, dizziness, vomiting, runny nose, decreased hearing)    headache  Protocols used: Earache-A-AH

## 2023-10-28 ENCOUNTER — Ambulatory Visit (INDEPENDENT_AMBULATORY_CARE_PROVIDER_SITE_OTHER): Admitting: Family Medicine

## 2023-10-28 ENCOUNTER — Encounter: Payer: Self-pay | Admitting: Family Medicine

## 2023-10-28 VITALS — BP 138/66 | HR 82 | Temp 97.9°F | Ht 60.5 in | Wt 175.1 lb

## 2023-10-28 DIAGNOSIS — H60502 Unspecified acute noninfective otitis externa, left ear: Secondary | ICD-10-CM | POA: Diagnosis not present

## 2023-10-28 MED ORDER — NEOMYCIN-POLYMYXIN-HC 1 % OT SOLN: 3.0000 [drp] | Freq: Three times a day (TID) | OTIC | 0 refills | Status: DC

## 2023-10-28 MED ORDER — ALBUTEROL SULFATE HFA 108 (90 BASE) MCG/ACT IN AERS: 2.0000 | INHALATION_SPRAY | Freq: Four times a day (QID) | RESPIRATORY_TRACT | 2 refills | Status: DC | PRN

## 2023-10-28 NOTE — Progress Notes (Signed)
 Patient ID: Tiffany Orozco, female    DOB: 1950-06-17, 73 y.o.   MRN: [id number]  This visit was conducted in person.  BP 138/66   Pulse 82   Temp 97.9 F (36.6 C) (Temporal)   Ht 5' 0.5" (1.537 m)   Wt 175 lb 2 oz (79.4 kg)   SpO2 97%   BMI 33.64 kg/m    CC:  Chief Complaint  Patient presents with   Ear Pain    Left Patient states she did get hearing aides 1 month ago    Subjective:   [name] is a 73 y.o. female presenting on 10/28/2023 for Ear Pain (Left/Patient states she did get hearing aides 1 month ago)   New onset left ear pain,.. sore in ear canal in last 3-4 days  Hearing more popping.  No ear discharge Some soreness pressing on left face.  Minimal nasal congestion. No fever, no coiugh Has chronic hearing loss.  Got hearing aids 1 month ago.    Hx of left ear  ETD.Aaron Aas    She is using eye drops for dry eye. Refresh, invizia lubricant... has helped with eye watering.     Relevant past medical, surgical, family and social history reviewed and updated as indicated. Interim medical history since our last visit reviewed. Allergies and medications reviewed and updated. Outpatient Medications Prior to Visit  Medication Sig Dispense Refill   Cholecalciferol (VITAMIN D3) 125 MCG (5000 UT) TABS Take 5,000 Units by mouth daily.     dipyridamole -aspirin  (AGGRENOX ) 200-25 MG 12hr capsule TAKE 1 CAPSULE BY MOUTH TWICE A DAY 180 capsule 3   ezetimibe  (ZETIA ) 10 MG tablet Take 1 tablet (10 mg total) by mouth daily. 90 tablet 3   fluticasone  (FLONASE ) 50 MCG/ACT nasal spray SPRAY 2 SPRAYS INTO EACH NOSTRIL EVERY DAY 48 mL 11   levothyroxine  (SYNTHROID ) 75 MCG tablet TAKE 1 TABLET BY MOUTH EVERY DAY 90 tablet 3   pantoprazole  (PROTONIX ) 40 MG tablet Take 1 tablet (40 mg total) by mouth 2 (two) times daily. 90 tablet 3   albuterol  (VENTOLIN  HFA) 108 (90 Base) MCG/ACT inhaler Inhale 2 puffs into the lungs every 6 (six) hours as needed for wheezing or shortness of  breath.     amoxicillin  (AMOXIL ) 500 MG capsule Take 2 capsules (1,000 mg total) by mouth 2 (two) times daily. 40 capsule 0   No facility-administered medications prior to visit.     Per HPI unless specifically indicated in ROS section below Review of Systems  Constitutional:  Negative for fatigue and fever.  HENT:  Positive for ear pain.   Eyes:  Negative for pain.  Respiratory:  Negative for chest tightness and shortness of breath.   Cardiovascular:  Negative for chest pain, palpitations and leg swelling.  Gastrointestinal:  Negative for abdominal pain.  Genitourinary:  Negative for dysuria.   Objective:  BP 138/66   Pulse 82   Temp 97.9 F (36.6 C) (Temporal)   Ht 5' 0.5" (1.537 m)   Wt 175 lb 2 oz (79.4 kg)   SpO2 97%   BMI 33.64 kg/m   Wt Readings from Last 3 Encounters:  11/02/23 175 lb (79.4 kg)  10/28/23 175 lb 2 oz (79.4 kg)  08/02/23 174 lb 2 oz (79 kg)      Physical Exam Constitutional:      General: She is not in acute distress.    Appearance: Normal appearance. She is well-developed. She is not ill-appearing or toxic-appearing.  HENT:     Head: Normocephalic.     Right Ear: Hearing, tympanic membrane, ear canal and external ear normal. No swelling or tenderness. No middle ear effusion. Tympanic membrane is not erythematous, retracted or bulging.     Left Ear: Hearing, ear canal and external ear normal. Drainage, swelling and tenderness present.  No middle ear effusion. Tympanic membrane is not erythematous, retracted or bulging.     Nose: No mucosal edema or rhinorrhea.     Right Sinus: No maxillary sinus tenderness or frontal sinus tenderness.     Left Sinus: No maxillary sinus tenderness or frontal sinus tenderness.     Mouth/Throat:     Pharynx: Uvula midline.  Eyes:     General: Lids are normal. Lids are everted, no foreign bodies appreciated.     Conjunctiva/sclera: Conjunctivae normal.     Pupils: Pupils are equal, round, and reactive to light.   Neck:     Thyroid : No thyroid  mass or thyromegaly.     Vascular: No carotid bruit.     Trachea: Trachea normal.  Cardiovascular:     Rate and Rhythm: Normal rate and regular rhythm.     Pulses: Normal pulses.     Heart sounds: Normal heart sounds, S1 normal and S2 normal. No murmur heard.    No friction rub. No gallop.  Pulmonary:     Effort: Pulmonary effort is normal. No tachypnea or respiratory distress.     Breath sounds: Normal breath sounds. No decreased breath sounds, wheezing, rhonchi or rales.  Abdominal:     General: Bowel sounds are normal.     Palpations: Abdomen is soft.     Tenderness: There is no abdominal tenderness.  Musculoskeletal:     Cervical back: Normal range of motion and neck supple.  Skin:    General: Skin is warm and dry.     Findings: No rash.  Neurological:     Mental Status: She is alert.  Psychiatric:        Mood and Affect: Mood is not anxious or depressed.        Speech: Speech normal.        Behavior: Behavior normal. Behavior is cooperative.        Thought Content: Thought content normal.        Judgment: Judgment normal.       Results for orders placed or performed in visit on 07/27/23  Lipid panel   Collection Time: 07/27/23  8:48 AM  Result Value Ref Range   Cholesterol 149 0 - 200 mg/dL   Triglycerides 409.8 0.0 - 149.0 mg/dL   HDL 11.91 >47.82 mg/dL   VLDL 95.6 0.0 - 21.3 mg/dL   LDL Cholesterol 78 0 - 99 mg/dL   Total CHOL/HDL Ratio 3    NonHDL 104.44   Comprehensive metabolic panel   Collection Time: 07/27/23  8:48 AM  Result Value Ref Range   Sodium 131 (L) 135 - 145 mEq/L   Potassium 4.7 3.5 - 5.1 mEq/L   Chloride 95 (L) 96 - 112 mEq/L   CO2 24 19 - 32 mEq/L   Glucose, Bld 106 (H) 70 - 99 mg/dL   BUN 10 6 - 23 mg/dL   Creatinine, Ser 0.86 0.40 - 1.20 mg/dL   Total Bilirubin 0.6 0.2 - 1.2 mg/dL   Alkaline Phosphatase 86 39 - 117 U/L   AST 15 0 - 37 U/L   ALT 17 0 - 35 U/L   Total Protein 7.5 6.0 -  8.3 g/dL    Albumin 4.3 3.5 - 5.2 g/dL   GFR 16.10 >96.04 mL/min   Calcium 9.0 8.4 - 10.5 mg/dL    Assessment and Plan  Acute otitis externa of left ear, unspecified type Assessment & Plan: Acute, will treat with topical antibiotic drops.  Can use Tylenol  as needed for pain.  Return and ER precautions provided.   Other orders -     Albuterol  Sulfate HFA; Inhale 2 puffs into the lungs every 6 (six) hours as needed for wheezing or shortness of breath.  Dispense: 18 g; Refill: 2 -     Neomycin -Polymyxin-HC; Place 3 drops into the left ear every 8 (eight) hours.  Dispense: 10 mL; Refill: 0    No follow-ups on file.   Herby Lolling, MD

## 2023-10-28 NOTE — Telephone Encounter (Signed)
 Spoke with Tiffany Orozco to let her know that Dr. Cherlyn Cornet will work her in today at 12:20 pm for her ear pain.  I ask that she arrive around 12:00 pm.  Will cancel appointment with Dr. Joelle Musca next week.

## 2023-10-28 NOTE — Telephone Encounter (Signed)
 I will see her on Monday if she doesn't need UC before that

## 2023-10-31 ENCOUNTER — Ambulatory Visit: Admitting: Internal Medicine

## 2023-11-02 ENCOUNTER — Ambulatory Visit: Admitting: Primary Care

## 2023-11-02 ENCOUNTER — Ambulatory Visit: Payer: Self-pay | Admitting: Primary Care

## 2023-11-02 ENCOUNTER — Encounter: Payer: Self-pay | Admitting: Primary Care

## 2023-11-02 VITALS — BP 138/76 | HR 85 | Temp 97.2°F | Ht 60.5 in | Wt 175.0 lb

## 2023-11-02 DIAGNOSIS — J441 Chronic obstructive pulmonary disease with (acute) exacerbation: Secondary | ICD-10-CM

## 2023-11-02 LAB — POC COVID19 BINAXNOW: SARS Coronavirus 2 Ag: NEGATIVE

## 2023-11-02 MED ORDER — AZITHROMYCIN 250 MG PO TABS
ORAL_TABLET | ORAL | 0 refills | Status: DC
Start: 2023-11-02 — End: 2024-01-27

## 2023-11-02 MED ORDER — PREDNISONE 20 MG PO TABS: ORAL_TABLET | ORAL | 0 refills | Status: DC

## 2023-11-02 NOTE — Progress Notes (Signed)
 Subjective:    Patient ID: Tiffany Orozco, female    DOB: 05-17-1951, 73 y.o.   MRN: [id number]  Shortness of Breath Pertinent negatives include no fever.    Tiffany Orozco is a very pleasant 73 y.o. female patient of Dr. Cherlyn Cornet with a history of hypertension, allergic rhinitis, emphysema, tobacco use who presents today to discuss dyspnea.   Symptom onset about 9 days ago with left ear and facial pain and post nasal drip. She was prescribed antibiotic ear drops. Three days ago she began to experience shortness of breath with rest and exertion with increased post nasal drip, maxillary facial pressure, chest tightness, and increased cough. She continues to experience left ear pain, popping sensation despite antibiotic drops. She's been using Flonase .   She was last evaluated on 10/28/2023 by PCP for a 3 to 4-day history of left ear pain and left facial pain. Conservative treatment was recommended.  Also treated by PCP on 08/02/2023 with amoxicillin  1000 mg twice daily x 10 days for acute sinusitis.  BP Readings from Last 3 Encounters:  11/02/23 138/76  10/28/23 138/66  08/02/23 (!) 140/72      Review of Systems  Constitutional:  Positive for fatigue. Negative for chills and fever.  HENT:  Positive for congestion, postnasal drip and sinus pressure.   Respiratory:  Positive for cough and shortness of breath.          Past Medical History:  Diagnosis Date   Allergy    Anxiety    COPD (chronic obstructive pulmonary disease) (HCC)    DDD (degenerative disc disease), lumbar    mild   Depression    Female rectocele without uterine prolapse    GERD (gastroesophageal reflux disease)    Hearing loss    Heart murmur    Hyperlipidemia    Hypertension    Hypothyroidism    IBS (irritable bowel syndrome)    Influenza A 06/2015   Intractable nausea and vomiting 03/01/2022   Obesity    Osteopenia    Plantar fasciitis    Pneumonia    Skin cancer of arm    Skin cancer of face     Stroke (HCC) 07/2010   no permenant deficits   Thrombocytopenia (HCC)    Tubal pregnancy     Social History   Socioeconomic History   Marital status: Married    Spouse name: Florean Hoobler   Number of children: 2   Years of education: Not on file   Highest education level: Not on file  Occupational History   Occupation: bookkeeper    Associate Professor: UNEMPLOYED    Comment: 10/10 not working x 9 years  Tobacco Use   Smoking status: Every Day    Current packs/day: 0.75    Average packs/day: 0.8 packs/day for 31.0 years (23.3 ttl pk-yrs)    Types: Cigarettes   Smokeless tobacco: Never   Tobacco comments:    States she is not smoking during current illness 05/29/18 sb  Vaping Use   Vaping status: Never Used  Substance and Sexual Activity   Alcohol use: No    Alcohol/week: 0.0 standard drinks of alcohol   Drug use: No   Sexual activity: Yes    Birth control/protection: Post-menopausal  Other Topics Concern   Not on file  Social History Narrative   5/10 husband had open heart surgery, not working, 2 grandchildren, siblings living and healthy   Social Drivers of Health   Financial Resource Strain: Low Risk  (04/18/2023)  Overall Financial Resource Strain (CARDIA)    Difficulty of Paying Living Expenses: Not hard at all  Food Insecurity: No Food Insecurity (04/18/2023)   Hunger Vital Sign    Worried About Running Out of Food in the Last Year: Never true    Ran Out of Food in the Last Year: Never true  Transportation Needs: No Transportation Needs (04/18/2023)   PRAPARE - Administrator, Civil Service (Medical): No    Lack of Transportation (Non-Medical): No  Physical Activity: Sufficiently Active (04/18/2023)   Exercise Vital Sign    Days of Exercise per Week: 5 days    Minutes of Exercise per Session: 30 min  Stress: No Stress Concern Present (04/18/2023)   [employer] of Occupational Health - Occupational Stress Questionnaire    Feeling of Stress :  Not at all  Social Connections: Socially Isolated (04/18/2023)   Social Connection and Isolation Panel [NHANES]    Frequency of Communication with Friends and Family: More than three times a week    Frequency of Social Gatherings with Friends and Family: Once a week    Attends Religious Services: Never    Database administrator or Organizations: No    Attends Banker Meetings: Never    Marital Status: Separated  Intimate Partner Violence: Not At Risk (04/18/2023)   Humiliation, Afraid, Rape, and Kick questionnaire    Fear of Current or Ex-Partner: No    Emotionally Abused: No    Physically Abused: No    Sexually Abused: No    Past Surgical History:  Procedure Laterality Date   APPENDECTOMY     CHOLECYSTECTOMY  2004   COLONOSCOPY     COLONOSCOPY WITH PROPOFOL  N/A 10/21/2022   Procedure: COLONOSCOPY WITH PROPOFOL ;  Surgeon: Quintin Buckle, DO;  Location: [facility] ENDOSCOPY;  Service: Gastroenterology;  Laterality: N/A;   CYST EXCISION Right    breast mole   EMGs  11/2002   atms and wrists neg   ESOPHAGEAL MANOMETRY N/A 03/17/2015   Procedure: ESOPHAGEAL MANOMETRY (EM);  Surgeon: Danette Duos, MD;  Location: [facility] ENDOSCOPY;  Service: Gastroenterology;  Laterality: N/A;   ESOPHAGOGASTRODUODENOSCOPY N/A 03/02/2022   Procedure: ESOPHAGOGASTRODUODENOSCOPY (EGD);  Surgeon: Quintin Buckle, DO;  Location: [facility] ENDOSCOPY;  Service: Gastroenterology;  Laterality: N/A;   ESOPHAGOGASTRODUODENOSCOPY (EGD) WITH PROPOFOL  N/A 05/06/2022   Procedure: ESOPHAGOGASTRODUODENOSCOPY (EGD) WITH PROPOFOL ;  Surgeon: Quintin Buckle, DO;  Location: [facility] ENDOSCOPY;  Service: Gastroenterology;  Laterality: N/A;   FOOT SURGERY Right 2008, 2009, 2010   torn ligament, hammertoes   HERNIA REPAIR     TUBAL LIGATION  1975   UPPER GI ENDOSCOPY     XI ROBOTIC ASSISTED VENTRAL HERNIA N/A 04/14/2020   Procedure: XI ROBOTIC ASSISTED VENTRAL HERNIA;  Surgeon: Mercy Stall, MD;   Location: [facility] ORS;  Service: General;  Laterality: N/A;    Family History  Problem Relation Age of Onset   Skin cancer Father    Hypertension Mother    Lung cancer Brother    Colon cancer Sister    Esophageal cancer Sister    Ovarian cancer Sister    Stomach cancer Neg Hx    Pancreatic cancer Neg Hx     Allergies  Allergen Reactions   Lisinopril  Shortness Of Breath   Niacin Other (See Comments)     dizziness   Welchol  [Colesevelam  Hcl] Diarrhea, Nausea Only, Swelling and Other (See Comments)     swelling in face   Augmentin  [Amoxicillin -Pot Clavulanate] Nausea Only  GI upset with taking.   Oxycodone Hcl     Patient states this medication makes her hyper    Current Outpatient Medications on File Prior to Visit  Medication Sig Dispense Refill   albuterol  (VENTOLIN  HFA) 108 (90 Base) MCG/ACT inhaler Inhale 2 puffs into the lungs every 6 (six) hours as needed for wheezing or shortness of breath. 18 g 2   Cholecalciferol (VITAMIN D3) 125 MCG (5000 UT) TABS Take 5,000 Units by mouth daily.     dipyridamole -aspirin  (AGGRENOX ) 200-25 MG 12hr capsule TAKE 1 CAPSULE BY MOUTH TWICE A DAY 180 capsule 3   ezetimibe  (ZETIA ) 10 MG tablet Take 1 tablet (10 mg total) by mouth daily. 90 tablet 3   fluticasone  (FLONASE ) 50 MCG/ACT nasal spray SPRAY 2 SPRAYS INTO EACH NOSTRIL EVERY DAY 48 mL 11   levothyroxine  (SYNTHROID ) 75 MCG tablet TAKE 1 TABLET BY MOUTH EVERY DAY 90 tablet 3   NEOMYCIN -POLYMYXIN-HYDROCORTISONE (CORTISPORIN) 1 % SOLN OTIC solution Place 3 drops into the left ear every 8 (eight) hours. 10 mL 0   pantoprazole  (PROTONIX ) 40 MG tablet Take 1 tablet (40 mg total) by mouth 2 (two) times daily. 90 tablet 3   No current facility-administered medications on file prior to visit.    BP 138/76   Pulse 85   Temp (!) 97.2 F (36.2 C) (Temporal)   Ht 5' 0.5" (1.537 m)   Wt 175 lb (79.4 kg)   SpO2 100%   BMI 33.61 kg/m  Objective:   Physical Exam Constitutional:       Appearance: She is ill-appearing.  HENT:     Right Ear: Ear canal normal. Tympanic membrane is bulging. Tympanic membrane is not injected or erythematous.     Left Ear: Ear canal normal. Tympanic membrane is bulging. Tympanic membrane is not injected or erythematous.     Nose: No mucosal edema.     Right Sinus: No maxillary sinus tenderness or frontal sinus tenderness.     Left Sinus: No maxillary sinus tenderness or frontal sinus tenderness.     Mouth/Throat:     Mouth: Mucous membranes are moist.  Eyes:     Conjunctiva/sclera: Conjunctivae normal.  Cardiovascular:     Rate and Rhythm: Normal rate and regular rhythm.  Pulmonary:     Effort: Pulmonary effort is normal.     Breath sounds: Normal breath sounds. No wheezing or rhonchi.     Comments: Mild dyspnea on exam Musculoskeletal:     Cervical back: Neck supple.  Skin:    General: Skin is warm and dry.           Assessment & Plan:  COPD with acute exacerbation [facility]) Assessment & Plan: HPI and exam today consistent.  COVID-19 test today negative. No evidence of ear infection.  Start Azithromycin  antibiotics for infection. Take 2 tablets by mouth today, then 1 tablet daily for 4 additional days. PCN allergy.  Start prednisone  20 mg tablets. Take 2 tablets by mouth once daily in the morning for 5 days.  Follow-up as needed.  Orders: -     predniSONE ; Take 2 tablets by mouth once daily in the morning for 5 days.  Dispense: 10 tablet; Refill: 0 -     Azithromycin ; Take 2 tablets by mouth today, then 1 tablet daily for 4 additional days.  Dispense: 6 tablet; Refill: 0 -     POC COVID-19 BinaxNow        Gabriel John, NP

## 2023-11-02 NOTE — Assessment & Plan Note (Signed)
 HPI and exam today consistent.  COVID-19 test today negative. No evidence of ear infection.  Start Azithromycin  antibiotics for infection. Take 2 tablets by mouth today, then 1 tablet daily for 4 additional days. PCN allergy.  Start prednisone  20 mg tablets. Take 2 tablets by mouth once daily in the morning for 5 days.  Follow-up as needed.

## 2023-11-02 NOTE — Patient Instructions (Signed)
 Start prednisone  20 mg tablets. Take 2 tablets by mouth once daily in the morning for 5 days.  Start Azithromycin  antibiotics for infection. Take 2 tablets by mouth today, then 1 tablet daily for 4 additional days.  It was a pleasure meeting you!

## 2023-11-03 DIAGNOSIS — H60502 Unspecified acute noninfective otitis externa, left ear: Secondary | ICD-10-CM | POA: Insufficient documentation

## 2023-11-03 NOTE — Assessment & Plan Note (Signed)
 Acute, will treat with topical antibiotic drops.  Can use Tylenol  as needed for pain.  Return and ER precautions provided.

## 2023-12-09 ENCOUNTER — Other Ambulatory Visit: Payer: Self-pay | Admitting: Family Medicine

## 2023-12-09 MED ORDER — PANTOPRAZOLE SODIUM 40 MG PO TBEC: 40.0000 mg | DELAYED_RELEASE_TABLET | Freq: Every day | ORAL | 1 refills | Status: DC

## 2023-12-09 NOTE — Telephone Encounter (Signed)
 Refill sent as requested.  Patient notified of this via telephone.

## 2023-12-09 NOTE — Telephone Encounter (Signed)
 Copied from CRM [id number]. Topic: General - Other >> Dec 09, 2023 12:26 PM Rosina BIRCH wrote: Reason for CRM: patient called wanting to get a refill on her pantoprazole  and she stated the pharmacy tried to get in contact with MD Onita office three times but they was unsuccessful. Patient stated MD Onita name is on the bottle and the patient need a refill CB [phone]

## 2023-12-09 NOTE — Telephone Encounter (Signed)
 Copied from CRM [id number]. Topic: Clinical - Medication Refill >> Dec 09, 2023  2:37 PM Shereese L wrote: Medication: pantoprazole  (PROTONIX ) 40 MG tablet  Has the patient contacted their pharmacy? Yes (Agent: If no, request that the patient contact the pharmacy for the refill. If patient does not wish to contact the pharmacy document the reason why and proceed with request.) (Agent: If yes, when and what did the pharmacy advise?)  This is the patient's preferred pharmacy:  [facility] [phone] [facility] [place] - [address] [place] [address] Phone: [phone] Fax: [phone]   Is this the correct pharmacy for this prescription? Yes If no, delete pharmacy and type the correct one.   Has the prescription been filled recently? Yes  Is the patient out of the medication? Yes  Has the patient been seen for an appointment in the last year OR does the patient have an upcoming appointment? Yes  Can we respond through MyChart? Yes  Agent: Please be advised that Rx refills may take up to 3 business days. We ask that you follow-up with your pharmacy.

## 2023-12-19 ENCOUNTER — Ambulatory Visit: Payer: Self-pay

## 2023-12-19 ENCOUNTER — Other Ambulatory Visit: Payer: Self-pay | Admitting: Family Medicine

## 2023-12-19 ENCOUNTER — Ambulatory Visit: Admission: EM | Admit: 2023-12-19 | Discharge: 2023-12-19 | Disposition: A | Discharge status: Home / Self Care

## 2023-12-19 DIAGNOSIS — J441 Chronic obstructive pulmonary disease with (acute) exacerbation: Secondary | ICD-10-CM

## 2023-12-19 DIAGNOSIS — J209 Acute bronchitis, unspecified: Secondary | ICD-10-CM | POA: Diagnosis not present

## 2023-12-19 MED ORDER — PREDNISONE 20 MG PO TABS: ORAL_TABLET | ORAL | 0 refills | Status: DC

## 2023-12-19 MED ORDER — AMOXICILLIN 500 MG PO CAPS: 500.0000 mg | ORAL_CAPSULE | Freq: Three times a day (TID) | ORAL | 0 refills | Status: DC

## 2023-12-19 NOTE — ED Triage Notes (Signed)
 Patient to Urgent Care with complaints of wheezing/ bilateral ear fullness/ chest congestion/ shortness of breath/ left sided ear swelling and pressure.   Symptoms intermittent over the last several months d/t weather and rain.  Started worsening Friday.   Using albuterol  inhaler/ flonase 

## 2023-12-19 NOTE — ED Provider Notes (Addendum)
 [facility]EL[facility]    CSN: [id number] Arrival date & time: 12/19/23  1307      History   Chief Complaint Chief Complaint  Patient presents with   Otalgia   Wheezing    HPI Tiffany Orozco is a 73 y.o. female.   Patient complains of cough congestion and wheezing.  Patient reports that she has a history of frequent episodes of bronchitis.  Patient uses an inhaler.  Patient states that she has a history of exacerbation of her symptoms whenever she is exposed to damp moist areas.  Patient reports she feels like her symptoms have been exacerbated by all the recent rain.  Patient reports she is not feeling short of breath.  Patient reports when she sees her provider she is normally treated with amoxicillin  and prednisone  for the symptoms.  Patient states that normally provides her with good relief.  The history is provided by the patient. No language interpreter was used.  Otalgia Wheezing Associated symptoms: ear pain     Past Medical History:  Diagnosis Date   Allergy    Anxiety    COPD (chronic obstructive pulmonary disease) (HCC)    DDD (degenerative disc disease), lumbar    mild   Depression    Female rectocele without uterine prolapse    GERD (gastroesophageal reflux disease)    Hearing loss    Heart murmur    Hyperlipidemia    Hypertension    Hypothyroidism    IBS (irritable bowel syndrome)    Influenza A 06/2015   Intractable nausea and vomiting 03/01/2022   Obesity    Osteopenia    Plantar fasciitis    Pneumonia    Skin cancer of arm    Skin cancer of face    Stroke (HCC) 07/2010   no permenant deficits   Thrombocytopenia (HCC)    Tubal pregnancy     Patient Active Problem List   Diagnosis Date Noted   Acute otitis externa of left ear 11/03/2023   Statin intolerance 04/14/2023   Acute cough 07/09/2022   Schatzki's ring 03/10/2022   Aortic atherosclerosis (HCC) 02/16/2021   Status post repair of ventral hernia 05/08/2020   Umbilical hernia  without obstruction and without gangrene    Esophagitis determined by biopsy 09/02/2016   PUD (peptic ulcer disease) 09/02/2016   Coronary atherosclerosis of native coronary artery 05/04/2016   Tobacco abuse 06/09/2015   COPD with acute exacerbation (HCC) 06/08/2015   History of CVA (cerebrovascular accident) 06/08/2015   IBS (irritable bowel syndrome) 06/08/2015   Dental caries 10/03/2012   HTN (hypertension) 11/30/2011   Herniation of rectum into vagina 08/29/2011   Hyponatremia 03/26/2011   Acute non-recurrent maxillary sinusitis 02/18/2011   CONSTIPATION, CHRONIC 03/19/2010   Osteopenia 03/19/2010   Chronic bilateral low back pain with bilateral sciatica 02/24/2010   UNSPECIFIED CARDIAC DYSRHYTHMIA 03/06/2009   Allergic rhinitis 08/14/2007   Emphysema lung (HCC) 08/14/2007   Hypothyroidism 02/27/2007   HYPERCHOLESTEROLEMIA 02/27/2007   Generalized anxiety disorder 02/27/2007   Depression, major, single episode, moderate (HCC) 02/27/2007    Past Surgical History:  Procedure Laterality Date   APPENDECTOMY     CHOLECYSTECTOMY  2004   COLONOSCOPY     COLONOSCOPY WITH PROPOFOL  N/A 10/21/2022   Procedure: COLONOSCOPY WITH PROPOFOL ;  Surgeon: Onita Elspeth Sharper, DO;  Location: [facility] ENDOSCOPY;  Service: Gastroenterology;  Laterality: N/A;   CYST EXCISION Right    breast mole   EMGs  11/2002   atms and wrists neg  ESOPHAGEAL MANOMETRY N/A 03/17/2015   Procedure: ESOPHAGEAL MANOMETRY (EM);  Surgeon: Elspeth Deward Naval, MD;  Location: [facility] ENDOSCOPY;  Service: Gastroenterology;  Laterality: N/A;   ESOPHAGOGASTRODUODENOSCOPY N/A 03/02/2022   Procedure: ESOPHAGOGASTRODUODENOSCOPY (EGD);  Surgeon: Onita Elspeth Sharper, DO;  Location: [facility] ENDOSCOPY;  Service: Gastroenterology;  Laterality: N/A;   ESOPHAGOGASTRODUODENOSCOPY (EGD) WITH PROPOFOL  N/A 05/06/2022   Procedure: ESOPHAGOGASTRODUODENOSCOPY (EGD) WITH PROPOFOL ;  Surgeon: Onita Elspeth Sharper, DO;  Location: [facility] ENDOSCOPY;   Service: Gastroenterology;  Laterality: N/A;   FOOT SURGERY Right 2008, 2009, 2010   torn ligament, hammertoes   HERNIA REPAIR     TUBAL LIGATION  1975   UPPER GI ENDOSCOPY     XI ROBOTIC ASSISTED VENTRAL HERNIA N/A 04/14/2020   Procedure: XI ROBOTIC ASSISTED VENTRAL HERNIA;  Surgeon: Marolyn Nest, MD;  Location: [facility] ORS;  Service: General;  Laterality: N/A;    OB History     Gravida  5   Para  2   Term  2   Preterm  0   AB  3   Living  2      SAB  2   IAB  0   Ectopic  1   Multiple  0   Live Births  2            Home Medications    Prior to Admission medications   Medication Sig Start Date End Date Taking? Authorizing Provider  amoxicillin  (AMOXIL ) 500 MG capsule Take 1 capsule (500 mg total) by mouth 3 (three) times daily. 12/19/23  Yes Prynce Jacober K, PA-C  Turmeric (QC TUMERIC COMPLEX PO) Take by mouth.   Yes [provider]  albuterol  (VENTOLIN  HFA) 108 (90 Base) MCG/ACT inhaler Inhale 2 puffs into the lungs every 6 (six) hours as needed for wheezing or shortness of breath. 10/28/23   Bedsole, Amy E, MD  azithromycin  (ZITHROMAX ) 250 MG tablet Take 2 tablets by mouth today, then 1 tablet daily for 4 additional days. Patient not taking: Reported on 12/19/2023 11/02/23   Clark, Katherine K, NP  Cholecalciferol (VITAMIN D3) 125 MCG (5000 UT) TABS Take 5,000 Units by mouth daily.    [provider]  dipyridamole -aspirin  (AGGRENOX ) 200-25 MG 12hr capsule TAKE 1 CAPSULE BY MOUTH TWICE A DAY 05/19/23   Bedsole, Amy E, MD  ezetimibe  (ZETIA ) 10 MG tablet Take 1 tablet (10 mg total) by mouth daily. 04/21/23   Bedsole, Amy E, MD  fluticasone  (FLONASE ) 50 MCG/ACT nasal spray SPRAY 2 SPRAYS INTO EACH NOSTRIL EVERY DAY 06/09/22   Bedsole, Amy E, MD  levothyroxine  (SYNTHROID ) 75 MCG tablet TAKE 1 TABLET BY MOUTH EVERY DAY 06/05/23   Bedsole, Amy E, MD  NEOMYCIN -POLYMYXIN-HYDROCORTISONE (CORTISPORIN) 1 % SOLN OTIC solution Place 3 drops into the left ear  every 8 (eight) hours. Patient not taking: Reported on 12/19/2023 10/28/23   Avelina Greig BRAVO, MD  pantoprazole  (PROTONIX ) 40 MG tablet Take 1 tablet (40 mg total) by mouth daily. 12/09/23   Bedsole, Amy E, MD  predniSONE  (DELTASONE ) 20 MG tablet Take 2 tablets by mouth once daily in the morning for 5 days. 12/19/23   Flint Sonny POUR, PA-C    Family History Family History  Problem Relation Age of Onset   Skin cancer Father    Hypertension Mother    Lung cancer Brother    Colon cancer Sister    Esophageal cancer Sister    Ovarian cancer Sister    Stomach cancer Neg Hx    Pancreatic cancer Neg  Hx     Social History Social History   Tobacco Use   Smoking status: Every Day    Current packs/day: 0.75    Average packs/day: 0.8 packs/day for 31.0 years (23.3 ttl pk-yrs)    Types: Cigarettes   Smokeless tobacco: Never   Tobacco comments:    States she is not smoking during current illness 05/29/18 sb  Vaping Use   Vaping status: Never Used  Substance Use Topics   Alcohol use: No    Alcohol/week: 0.0 standard drinks of alcohol   Drug use: No     Allergies   Lisinopril , Niacin, Welchol  [colesevelam  hcl], Augmentin  [amoxicillin -pot clavulanate], and Oxycodone hcl   Review of Systems Review of Systems  HENT:  Positive for ear pain.   Respiratory:  Positive for wheezing.   All other systems reviewed and are negative.    Physical Exam Triage Vital Signs ED Triage Vitals  Encounter Vitals Group     BP 12/19/23 1333 (!) 158/79     Girls Systolic BP Percentile --      Girls Diastolic BP Percentile --      Boys Systolic BP Percentile --      Boys Diastolic BP Percentile --      Pulse Rate 12/19/23 1333 84     Resp 12/19/23 1333 20     Temp 12/19/23 1333 98.1 F (36.7 C)     Temp src --      SpO2 12/19/23 1333 97 %     Weight --      Height --      Head Circumference --      Peak Flow --      Pain Score 12/19/23 1339 7     Pain Loc --      Pain Education --       Exclude from Growth Chart --    No data found.  Updated Vital Signs BP (!) 158/79   Pulse 84   Temp 98.1 F (36.7 C)   Resp 20   SpO2 97%   Visual Acuity Right Eye Distance:   Left Eye Distance:   Bilateral Distance:    Right Eye Near:   Left Eye Near:    Bilateral Near:     Physical Exam Vitals and nursing note reviewed.  Constitutional:      Appearance: She is well-developed.  HENT:     Head: Normocephalic.     Mouth/Throat:     Mouth: Mucous membranes are moist.  Cardiovascular:     Rate and Rhythm: Normal rate.  Pulmonary:     Effort: Pulmonary effort is normal.  Abdominal:     General: There is no distension.  Musculoskeletal:        General: Normal range of motion.     Cervical back: Normal range of motion.  Skin:    General: Skin is warm.  Neurological:     General: No focal deficit present.     Mental Status: She is alert and oriented to person, place, and time.      UC Treatments / Results  Labs (all labs ordered are listed, but only abnormal results are displayed) Labs Reviewed - No data to display  EKG   Radiology No results found.  Procedures Procedures (including critical care time)  Medications Ordered in UC Medications - No data to display  Initial Impression / Assessment and Plan / UC Course  I have reviewed the triage vital signs and the nursing notes.  Pertinent  labs & imaging results that were available during my care of the patient were reviewed by me and considered in my medical decision making (see chart for details).    Patient looks well overall, no current wheezing.  Patient given prescription for prednisone  and amoxicillin  she is advised to follow-up with her primary care physician  Final Clinical Impressions(s) / UC Diagnoses   Final diagnoses:  Acute bronchitis, unspecified organism     Discharge Instructions      Return if any problems.  See your Physician for recheck    ED Prescriptions     Medication  Sig Dispense Auth. Provider   predniSONE  (DELTASONE ) 20 MG tablet Take 2 tablets by mouth once daily in the morning for 5 days. 10 tablet Flynn Gwyn K, PA-C   amoxicillin  (AMOXIL ) 500 MG capsule Take 1 capsule (500 mg total) by mouth 3 (three) times daily. 30 capsule Brittin Belnap K, PA-C      An After Visit Summary was printed and given to the patient.        PDMP not reviewed this encounter.   Flint Sonny POUR, PA-C 12/19/23 1435    Flint Sonny POUR, PA-C 12/19/23 1436

## 2023-12-19 NOTE — Telephone Encounter (Signed)
 I spoke with pt; pt having SOB and wheezing and head congestion,No available appts at [facility] or [facility] . Pt is going to [employer]. Sending note to Dr Garwin and [place] pool.

## 2023-12-19 NOTE — Telephone Encounter (Signed)
 FYI Only or Action Required?: Action required by provider: clinical question for provider.  Patient was last seen in primary care on 11/02/2023 by Gretta Comer POUR, NP.  Called Nurse Triage reporting Breathing Problem.  Symptoms began several days ago.  Interventions attempted: Prescription medications: albuterol  and Flonase .  Symptoms are: gradually worsening.  Triage Disposition: See HCP Within 4 Hours (Or PCP Triage)  Patient/caregiver understands and will follow disposition?: No, wishes to speak with PCP  Pt asking if prednisone  and abx can be sent in. Having sx again d/t all the rain and weather causing SOB, wheezing and ear congestion and swelling. No appts today. Pt preferred to send message rather than schedule OV tomorrow. Pt would like call back today.   Copied from CRM [id number]. Topic: Clinical - Red Word Triage >> Dec 19, 2023  9:12 AM [place] A wrote: [facility] that prompted transfer to Nurse Triage: Patient is having shortness of breath-started Friday. Thinks it is from the weather Reason for Disposition  [1] MILD difficulty breathing (e.g., minimal/no SOB at rest, SOB with walking, pulse < 100) AND [2] NEW-onset or WORSE than normal  Answer Assessment - Initial Assessment Questions 1. RESPIRATORY STATUS: Describe your breathing? (e.g., wheezing, shortness of breath, unable to speak, severe coughing)      SOB and wheezing  2. ONSET: When did this breathing problem begin?      Several days, ongoing few months  3. PATTERN Does the difficult breathing come and go, or has it been constant since it started?      Constant since Friday  4. SEVERITY: How bad is your breathing? (e.g., mild, moderate, severe)      Mild to moderate  5. RECURRENT SYMPTOM: Have you had difficulty breathing before? If Yes, ask: When was the last time? and What happened that time?      Last month  7. LUNG HISTORY: Do you have any history of lung disease?  (e.g., pulmonary embolus,  asthma, emphysema)     COPD 8. CAUSE: What do you think is causing the breathing problem?      Weather  9. OTHER SYMPTOMS: Do you have any other symptoms? (e.g., chest pain, cough, dizziness, fever, runny nose)     Cough and ear congestion   Has used inhaler last 330AM and nose spray  Protocols used: Breathing Difficulty-A-AH

## 2023-12-19 NOTE — Discharge Instructions (Addendum)
Return if any problems.  See your Physician for recheck  °

## 2024-01-26 ENCOUNTER — Ambulatory Visit: Payer: Self-pay

## 2024-01-26 NOTE — Telephone Encounter (Signed)
 FYI Only or Action Required?: FYI only for provider.  Patient was last seen in primary care on 11/02/2023 by Gretta Comer POUR, NP.  Called Nurse Triage reporting Tick Removal.  Symptoms began several weeks ago.  Interventions attempted: OTC medications: tyelnol, and ointment .  Symptoms are: unchanged.  Triage Disposition: See Physician Within 24 Hours  Patient/caregiver understands and will follow disposition?: Yes   Copied from CRM #[id number]. Topic: Clinical - Red Word Triage >> Jan 26, 2024 11:49 AM Jasmin G wrote: [facility] that prompted transfer to Nurse Triage: Pt has tick bite on the back of her leg, sore has been there for 3 weeks, pt's husband pulled scab off but tick still has a red ring around it and it hasn't gone away, anytime anything rubs it hurts and aches, pt has also been feeling nauseous and overall sick and also stated that she started having diarrhea about 2 days ago, not sure if it's related to the issue. Reason for Disposition  Red ring or bull's-eye rash occurs at tick bite  Answer Assessment - Initial Assessment Questions 1. ATTACHED:  Is the tick still on the skin?  (e.g., yes, no, unsure)     no 2. ONSET - TICK STILL ATTACHED:  How long do you think the tick has been on your skin? (e.g., hours, days, unsure)  Note:  Is there a recent activity (camping, hiking) where the caller may have been exposed?     no 3. ONSET - TICK NOT STILL ATTACHED: If the tick has been removed, how long do you think the tick was attached before you removed it? (e.g., 5 hours, 2 days). When was this?     Was pulled off 3 weeks ago  4. LOCATION: Where is the tick bite located? (e.g., arm, leg)     Back of legs 5. TYPE of TICK: Is it a wood tick or a deer tick? (e.g., deer tick, wood tick; unsure)     unsure 6. SIZE of TICK: How big is the tick? (e.g., size of poppy seed, apple seed, watermelon seed; unsure) Note: Deer ticks can be the size of a poppy seed (nymph) or an  apple seed (adult).       Real tiny, could barely see it 7. ENGORGED: Did the tick look flat or engorged (full, swollen)? (e.g., flat, engorged; unsure)     no 8. OTHER SYMPTOMS: Do you have any other symptoms? (e.g., fever, rash, redness at bite area, red ring around bite) Red ring around the bite area still, pain at the site, nausea, diarrhea  Protocols used: Tick Bite-A-AH

## 2024-01-26 NOTE — Telephone Encounter (Signed)
 Will see patient then Agree with ER and UC precautions

## 2024-01-27 ENCOUNTER — Encounter: Payer: Self-pay | Admitting: Family Medicine

## 2024-01-27 ENCOUNTER — Ambulatory Visit (INDEPENDENT_AMBULATORY_CARE_PROVIDER_SITE_OTHER): Admitting: Family Medicine

## 2024-01-27 VITALS — BP 170/92 | HR 72 | Temp 97.7°F | Ht 60.5 in | Wt 175.4 lb

## 2024-01-27 DIAGNOSIS — I10 Essential (primary) hypertension: Secondary | ICD-10-CM | POA: Diagnosis not present

## 2024-01-27 DIAGNOSIS — S80862A Insect bite (nonvenomous), left lower leg, initial encounter: Secondary | ICD-10-CM | POA: Diagnosis not present

## 2024-01-27 DIAGNOSIS — K209 Esophagitis, unspecified without bleeding: Secondary | ICD-10-CM

## 2024-01-27 DIAGNOSIS — Z720 Tobacco use: Secondary | ICD-10-CM

## 2024-01-27 DIAGNOSIS — W57XXXA Bitten or stung by nonvenomous insect and other nonvenomous arthropods, initial encounter: Secondary | ICD-10-CM

## 2024-01-27 MED ORDER — AMLODIPINE BESYLATE 5 MG PO TABS: 5.0000 mg | ORAL_TABLET | Freq: Every day | ORAL | 0 refills | Status: DC

## 2024-01-27 NOTE — Patient Instructions (Addendum)
 Start back on pantoprazole  for acid reflux if you have symptoms and follow up with Dr Avelina  Your tick bite looks ok  We can do some tick fever labs today and will reach out with results   For blood pressure start back on amlodipine  5 mg once daily  See Dr Bedsole as soon as you can    If symptoms become severe-got to the ER  Think about quitting smoking

## 2024-01-27 NOTE — Assessment & Plan Note (Signed)
 Pt has more reflux symptoms (mentions nausea) Has bottle of protonix  40 mg  Instructed to start it back and follow up with pcp

## 2024-01-27 NOTE — Assessment & Plan Note (Signed)
 Blood pressure is very high Looks to have been trending up  Some headaches  BP: (!) 170/92  Reviewed pcp notes /past plan Will start pt back on amlodipine  5 mg daily (she will start today) Follow up with pcp next week  Encouraged smoking cessation  Encouraged lifestyle change   Call back and Er precautions noted in detail today

## 2024-01-27 NOTE — Assessment & Plan Note (Signed)
 3-4 wk ago - sounds like deer tick based on size  Tiny 1-2 mm scab remains  No red ring/swelling/or erythema No rash or fever  No new joint pain  Reassuring exam   Pt thinks the nausea was from GERD and diarrhea from IBS  Will do tick labs today to be certain Instructed to watch for fever/rash or other symptoms

## 2024-01-27 NOTE — Assessment & Plan Note (Signed)
 Encouraged cessation High blood pressure  Copd  Pt is not interested in quitting Encouraged her to keep thinking about it

## 2024-01-27 NOTE — Progress Notes (Signed)
 Subjective:    Patient ID: Tiffany Orozco, female    DOB: 1951-04-05, 73 y.o.   MRN: [id number]  HPI  Wt Readings from Last 3 Encounters:  01/27/24 175 lb 6 oz (79.5 kg)  11/02/23 175 lb (79.4 kg)  10/28/23 175 lb 2 oz (79.4 kg)   33.69 kg/m  Vitals:   01/27/24 1515  BP: (!) 170/92  Pulse: 72  Temp: 97.7 F (36.5 C)  SpO2: 98%    73 yo pt of Dr Avelina presents with c/o Tick bite Elevted blood pressure with headache Mentions chronic GI issue/ IBS and gerd  Has protonix -not taking / can she start back on it?    On left calf  4 weeks ago  Was a tiny tick (deer tick)?  Spot is still there     Early on pulled scab off but bite still has red ring around it  Is sore to the tough and aches  Tender to the touch   Some headaches  Some nausea -better today (may be from acid reflux)  Diarrhea of several days - improved   Had a low grade temp for a few days    History of HTN  BP Readings from Last 3 Encounters:  01/27/24 (!) 170/92  12/19/23 (!) 158/79  11/02/23 138/76   Lab Results  Component Value Date   NA 131 (L) 07/27/2023   K 4.7 07/27/2023   CO2 24 07/27/2023   GLUCOSE 106 (H) 07/27/2023   BUN 10 07/27/2023   CREATININE 0.78 07/27/2023   CALCIUM 9.0 07/27/2023   GFR 74.63 07/27/2023   GFRNONAA >60 03/02/2022      Patient Active Problem List   Diagnosis Date Noted   Tick bite of left lower leg 01/27/2024   Acute otitis externa of left ear 11/03/2023   Statin intolerance 04/14/2023   Acute cough 07/09/2022   Schatzki's ring 03/10/2022   Aortic atherosclerosis (HCC) 02/16/2021   Status post repair of ventral hernia 05/08/2020   Umbilical hernia without obstruction and without gangrene    Esophagitis determined by biopsy 09/02/2016   PUD (peptic ulcer disease) 09/02/2016   Coronary atherosclerosis of native coronary artery 05/04/2016   Tobacco abuse 06/09/2015   COPD with acute exacerbation (HCC) 06/08/2015   History of CVA  (cerebrovascular accident) 06/08/2015   IBS (irritable bowel syndrome) 06/08/2015   Dental caries 10/03/2012   HTN (hypertension) 11/30/2011   Herniation of rectum into vagina 08/29/2011   Hyponatremia 03/26/2011   Acute non-recurrent maxillary sinusitis 02/18/2011   CONSTIPATION, CHRONIC 03/19/2010   Osteopenia 03/19/2010   Chronic bilateral low back pain with bilateral sciatica 02/24/2010   UNSPECIFIED CARDIAC DYSRHYTHMIA 03/06/2009   Allergic rhinitis 08/14/2007   Emphysema lung (HCC) 08/14/2007   Hypothyroidism 02/27/2007   HYPERCHOLESTEROLEMIA 02/27/2007   Generalized anxiety disorder 02/27/2007   Depression, major, single episode, moderate (HCC) 02/27/2007   Past Medical History:  Diagnosis Date   Allergy    Anxiety    COPD (chronic obstructive pulmonary disease) (HCC)    DDD (degenerative disc disease), lumbar    mild   Depression    Female rectocele without uterine prolapse    GERD (gastroesophageal reflux disease)    Hearing loss    Heart murmur    Hyperlipidemia    Hypertension    Hypothyroidism    IBS (irritable bowel syndrome)    Influenza A 06/2015   Intractable nausea and vomiting 03/01/2022   Obesity    Osteopenia    Plantar fasciitis  Pneumonia    Skin cancer of arm    Skin cancer of face    Stroke (HCC) 07/2010   no permenant deficits   Thrombocytopenia (HCC)    Tubal pregnancy    Past Surgical History:  Procedure Laterality Date   APPENDECTOMY     CHOLECYSTECTOMY  2004   COLONOSCOPY     COLONOSCOPY WITH PROPOFOL  N/A 10/21/2022   Procedure: COLONOSCOPY WITH PROPOFOL ;  Surgeon: Onita Elspeth Sharper, DO;  Location: [facility] ENDOSCOPY;  Service: Gastroenterology;  Laterality: N/A;   CYST EXCISION Right    breast mole   EMGs  11/2002   atms and wrists neg   ESOPHAGEAL MANOMETRY N/A 03/17/2015   Procedure: ESOPHAGEAL MANOMETRY (EM);  Surgeon: Elspeth Deward Naval, MD;  Location: [facility] ENDOSCOPY;  Service: Gastroenterology;  Laterality: N/A;    ESOPHAGOGASTRODUODENOSCOPY N/A 03/02/2022   Procedure: ESOPHAGOGASTRODUODENOSCOPY (EGD);  Surgeon: Onita Elspeth Sharper, DO;  Location: [facility] ENDOSCOPY;  Service: Gastroenterology;  Laterality: N/A;   ESOPHAGOGASTRODUODENOSCOPY (EGD) WITH PROPOFOL  N/A 05/06/2022   Procedure: ESOPHAGOGASTRODUODENOSCOPY (EGD) WITH PROPOFOL ;  Surgeon: Onita Elspeth Sharper, DO;  Location: [facility] ENDOSCOPY;  Service: Gastroenterology;  Laterality: N/A;   FOOT SURGERY Right 2008, 2009, 2010   torn ligament, hammertoes   HERNIA REPAIR     TUBAL LIGATION  1975   UPPER GI ENDOSCOPY     XI ROBOTIC ASSISTED VENTRAL HERNIA N/A 04/14/2020   Procedure: XI ROBOTIC ASSISTED VENTRAL HERNIA;  Surgeon: Marolyn Nest, MD;  Location: [facility] ORS;  Service: General;  Laterality: N/A;   Social History   Tobacco Use   Smoking status: Every Day    Current packs/day: 0.75    Average packs/day: 0.8 packs/day for 31.0 years (23.3 ttl pk-yrs)    Types: Cigarettes   Smokeless tobacco: Never   Tobacco comments:    States she is not smoking during current illness 05/29/18 sb  Vaping Use   Vaping status: Never Used  Substance Use Topics   Alcohol use: No    Alcohol/week: 0.0 standard drinks of alcohol   Drug use: No   Family History  Problem Relation Age of Onset   Skin cancer Father    Hypertension Mother    Lung cancer Brother    Colon cancer Sister    Esophageal cancer Sister    Ovarian cancer Sister    Stomach cancer Neg Hx    Pancreatic cancer Neg Hx    Allergies  Allergen Reactions   Lisinopril  Shortness Of Breath   Niacin Other (See Comments)     dizziness   Welchol  [Colesevelam  Hcl] Diarrhea, Nausea Only, Swelling and Other (See Comments)     swelling in face   Augmentin  [Amoxicillin -Pot Clavulanate] Nausea Only    GI upset with taking.   Oxycodone Hcl     Patient states this medication makes her hyper   Current Outpatient Medications on File Prior to Visit  Medication Sig Dispense Refill   albuterol   (VENTOLIN  HFA) 108 (90 Base) MCG/ACT inhaler Inhale 2 puffs into the lungs every 6 (six) hours as needed for wheezing or shortness of breath. 18 g 2   Boswellia Serrata (BOSWELLIA PO) Take 1 capsule by mouth daily.     Cholecalciferol (VITAMIN D3) 125 MCG (5000 UT) TABS Take 5,000 Units by mouth daily.     dipyridamole -aspirin  (AGGRENOX ) 200-25 MG 12hr capsule TAKE 1 CAPSULE BY MOUTH TWICE A DAY 180 capsule 3   ezetimibe  (ZETIA ) 10 MG tablet Take 1 tablet (10 mg total) by mouth daily. 90 tablet 3  fluticasone  (FLONASE ) 50 MCG/ACT nasal spray SPRAY 2 SPRAYS INTO EACH NOSTRIL EVERY DAY 48 mL 0   levothyroxine  (SYNTHROID ) 75 MCG tablet TAKE 1 TABLET BY MOUTH EVERY DAY 90 tablet 3   pantoprazole  (PROTONIX ) 40 MG tablet Take 1 tablet (40 mg total) by mouth daily. 90 tablet 1   predniSONE  (DELTASONE ) 20 MG tablet Take 2 tablets by mouth once daily in the morning for 5 days. 10 tablet 0   Turmeric (QC TUMERIC COMPLEX PO) Take by mouth.     No current facility-administered medications on file prior to visit.    Review of Systems  Constitutional:  Negative for activity change, appetite change, fatigue, fever and unexpected weight change.  HENT:  Positive for postnasal drip. Negative for congestion, ear pain, rhinorrhea, sinus pressure and sore throat.        Seasonal allergies   Eyes:  Negative for pain, redness and visual disturbance.  Respiratory:  Negative for cough, shortness of breath and wheezing.   Cardiovascular:  Negative for chest pain and palpitations.  Gastrointestinal:  Positive for diarrhea and nausea. Negative for abdominal pain, blood in stool and constipation.       Nausea is much better today  Pt thinks loose stool is from IBS   Endocrine: Negative for polydipsia and polyuria.  Genitourinary:  Negative for dysuria, frequency and urgency.  Musculoskeletal:  Negative for arthralgias, back pain and myalgias.       Left calf is sore where the tick bite was   Skin:  Negative for  color change, pallor and rash.  Allergic/Immunologic: Negative for environmental allergies.  Neurological:  Positive for headaches. Negative for dizziness and syncope.  Hematological:  Negative for adenopathy. Does not bruise/bleed easily.  Psychiatric/Behavioral:  Negative for decreased concentration and dysphoric mood. The patient is not nervous/anxious.        Objective:   Physical Exam Constitutional:      General: She is not in acute distress.    Appearance: Normal appearance. She is well-developed. She is obese. She is not ill-appearing or diaphoretic.  HENT:     Head: Normocephalic and atraumatic.     Mouth/Throat:     Mouth: Mucous membranes are moist.  Eyes:     General: No scleral icterus.       Right eye: No discharge.        Left eye: No discharge.     Conjunctiva/sclera: Conjunctivae normal.     Pupils: Pupils are equal, round, and reactive to light.  Neck:     Thyroid : No thyromegaly.     Vascular: No carotid bruit or JVD.  Cardiovascular:     Rate and Rhythm: Normal rate and regular rhythm.     Heart sounds: Normal heart sounds.     No gallop.  Pulmonary:     Effort: Pulmonary effort is normal. No respiratory distress.     Breath sounds: Normal breath sounds. No wheezing, rhonchi or rales.     Comments: Diffusely distant bs  Abdominal:     General: There is no distension or abdominal bruit.     Palpations: Abdomen is soft. There is no mass.     Tenderness: There is no abdominal tenderness. There is no guarding or rebound.  Musculoskeletal:     Cervical back: Normal range of motion and neck supple.     Right lower leg: No edema.     Left lower leg: No edema.  Lymphadenopathy:     Cervical: No cervical adenopathy.  Skin:  General: Skin is warm and dry.     Coloration: Skin is not pale.     Findings: No rash.     Comments: 1-2 mm round scab posterior left calf Non tender No rash/ erythema or rings noted    Ruddy complexion No acute rashes Dry skin    Neurological:     Mental Status: She is alert.     Cranial Nerves: No cranial nerve deficit.     Coordination: Coordination normal.     Deep Tendon Reflexes: Reflexes are normal and symmetric. Reflexes normal.  Psychiatric:        Mood and Affect: Mood is anxious.     Comments: Mildly anxious            Assessment & Plan:   Problem List Items Addressed This Visit       Cardiovascular and Mediastinum   HTN (hypertension)   Blood pressure is very high Looks to have been trending up  Some headaches  BP: (!) 170/92  Reviewed pcp notes /past plan Will start pt back on amlodipine  5 mg daily (she will start today) Follow up with pcp next week  Encouraged smoking cessation  Encouraged lifestyle change   Call back and Er precautions noted in detail today        Relevant Medications   amLODipine  (NORVASC ) 5 MG tablet     Digestive   Esophagitis determined by biopsy   Pt has more reflux symptoms (mentions nausea) Has bottle of protonix  40 mg  Instructed to start it back and follow up with pcp         Musculoskeletal and Integument   Tick bite of left lower leg - Primary   3-4 wk ago - sounds like deer tick based on size  Tiny 1-2 mm scab remains  No red ring/swelling/or erythema No rash or fever  No new joint pain  Reassuring exam   Pt thinks the nausea was from GERD and diarrhea from IBS  Will do tick labs today to be certain Instructed to watch for fever/rash or other symptoms         Relevant Orders   B. burgdorfi antibodies by [facility]   Rocky mtn spotted fvr abs pnl(IgG+IgM)     Other   Tobacco abuse   Encouraged cessation High blood pressure  Copd  Pt is not interested in quitting Encouraged her to keep thinking about it

## 2024-02-01 ENCOUNTER — Ambulatory Visit: Payer: Self-pay | Admitting: Family Medicine

## 2024-02-01 LAB — B. BURGDORFI ANTIBODIES BY WB

## 2024-02-01 LAB — ROCKY MTN SPOTTED FVR ABS PNL(IGG+IGM)
RMSF IgG: NOT DETECTED
RMSF IgM: NOT DETECTED

## 2024-02-07 ENCOUNTER — Encounter: Payer: Self-pay | Admitting: Family Medicine

## 2024-02-07 ENCOUNTER — Ambulatory Visit (INDEPENDENT_AMBULATORY_CARE_PROVIDER_SITE_OTHER): Admitting: Family Medicine

## 2024-02-07 VITALS — BP 130/64 | HR 82 | Temp 98.2°F | Resp 20 | Ht 60.5 in | Wt 177.4 lb

## 2024-02-07 DIAGNOSIS — I10 Essential (primary) hypertension: Secondary | ICD-10-CM

## 2024-02-07 DIAGNOSIS — K2100 Gastro-esophageal reflux disease with esophagitis, without bleeding: Secondary | ICD-10-CM | POA: Diagnosis not present

## 2024-02-07 MED ORDER — PANTOPRAZOLE SODIUM 40 MG PO TBEC: 40.0000 mg | DELAYED_RELEASE_TABLET | Freq: Every day | ORAL | 1 refills | Status: AC

## 2024-02-07 MED ORDER — AMLODIPINE BESYLATE 5 MG PO TABS: 5.0000 mg | ORAL_TABLET | Freq: Every day | ORAL | 3 refills | Status: DC

## 2024-02-07 NOTE — Assessment & Plan Note (Signed)
 Chronic, continue pantoprazole  40 mg p.o. daily.  Refill sent. For Nam sore throat recommended adding Pepcid  AC at bedtime to block acid. Decrease acidic foods. Denies signs and symptoms of infectious disease such as viral URI.   Does use Flonase  2 sprays per nostril to help with runny nose chronically.  Discussed that this could irritate throat.  She can try trial this.  If not continuing to improve consider follow-up with GI.

## 2024-02-07 NOTE — Progress Notes (Signed)
 Patient ID: Tiffany Orozco, female    DOB: 1950-08-28, 73 y.o.   MRN: [id number]  This visit was conducted in person.  BP 130/64   Pulse 82   Temp 98.2 F (36.8 C)   Resp 20   Ht 5' 0.5 (1.537 m)   Wt 177 lb 6 oz (80.5 kg)   SpO2 98%   BMI 34.07 kg/m    CC:  Chief Complaint  Patient presents with   Hypertension    Follow up for HTN    Subjective:   [name] is a 73 y.o. female presenting on 02/07/2024 for Hypertension (Follow up for HTN)  Hypertension:  Recent elevated blood pressure at  sick visits for bronchitis and tick bite. Per Dr. Erna last note given trending up blood pressure with headaches.  Started back on amlodipine  5 mg daily.  Encouraged smoking cessation and lifestyle changes.  At goal in office today on. amlodipine   5 mg... she does not like this med as it causes wooziness.  If taking at bedtime she tolerates it.   Had also recently been eating lots of salt.... has stopped now.  No further headaches.  BP Readings from Last 3 Encounters:  02/07/24 130/64  01/27/24 (!) 170/92  12/19/23 (!) 158/79  Using medication without problems or lightheadedness:  none Chest pain with exertion: none Edema: none Short of breath: none Average home BPs: Other issues:  Discussed timing of when to take her medication.  She does better if she takes amlodipine  at bedtime.  She is also taking curcumin and turmeric for joint pain.   She notes sore throat when she wakes up in the morning.  She is on pantoprazole  40 mg daily for reflux.  Per patient erosive esophagitis and peptic ulcer disease significantly improved with pantoprazole . She requests refill today.     Relevant past medical, surgical, family and social history reviewed and updated as indicated. Interim medical history since our last visit reviewed. Allergies and medications reviewed and updated. Outpatient Medications Prior to Visit  Medication Sig Dispense Refill   albuterol  (VENTOLIN   HFA) 108 (90 Base) MCG/ACT inhaler Inhale 2 puffs into the lungs every 6 (six) hours as needed for wheezing or shortness of breath. 18 g 2   amLODipine  (NORVASC ) 5 MG tablet Take 1 tablet (5 mg total) by mouth daily. 30 tablet 0   Boswellia Serrata (BOSWELLIA PO) Take 1 capsule by mouth daily.     Cholecalciferol (VITAMIN D3) 125 MCG (5000 UT) TABS Take 5,000 Units by mouth daily.     dipyridamole -aspirin  (AGGRENOX ) 200-25 MG 12hr capsule TAKE 1 CAPSULE BY MOUTH TWICE A DAY 180 capsule 3   ezetimibe  (ZETIA ) 10 MG tablet Take 1 tablet (10 mg total) by mouth daily. 90 tablet 3   fluticasone  (FLONASE ) 50 MCG/ACT nasal spray SPRAY 2 SPRAYS INTO EACH NOSTRIL EVERY DAY 48 mL 0   levothyroxine  (SYNTHROID ) 75 MCG tablet TAKE 1 TABLET BY MOUTH EVERY DAY 90 tablet 3   pantoprazole  (PROTONIX ) 40 MG tablet Take 1 tablet (40 mg total) by mouth daily. 90 tablet 1   Turmeric (QC TUMERIC COMPLEX PO) Take by mouth.     predniSONE  (DELTASONE ) 20 MG tablet Take 2 tablets by mouth once daily in the morning for 5 days. 10 tablet 0   No facility-administered medications prior to visit.     Per HPI unless specifically indicated in ROS section below Review of Systems  Constitutional:  Negative for fatigue and fever.  HENT:  Negative for congestion.   Eyes:  Negative for pain.  Respiratory:  Negative for cough and shortness of breath.   Cardiovascular:  Negative for chest pain, palpitations and leg swelling.  Gastrointestinal:  Negative for abdominal pain.  Genitourinary:  Negative for dysuria and vaginal bleeding.  Musculoskeletal:  Negative for back pain.  Neurological:  Negative for syncope, light-headedness and headaches.  Psychiatric/Behavioral:  Negative for dysphoric mood.    Objective:  BP 130/64   Pulse 82   Temp 98.2 F (36.8 C)   Resp 20   Ht 5' 0.5 (1.537 m)   Wt 177 lb 6 oz (80.5 kg)   SpO2 98%   BMI 34.07 kg/m   Wt Readings from Last 3 Encounters:  02/07/24 177 lb 6 oz (80.5 kg)   02/04/2024 175 lb 6 oz (79.5 kg)  11/02/23 175 lb (79.4 kg)      Physical Exam Constitutional:      General: She is not in acute distress.    Appearance: Normal appearance. She is well-developed. She is not ill-appearing or toxic-appearing.  HENT:     Head: Normocephalic.     Right Ear: Hearing, tympanic membrane, ear canal and external ear normal. Tympanic membrane is not erythematous, retracted or bulging.     Left Ear: Hearing, tympanic membrane, ear canal and external ear normal. Tympanic membrane is not erythematous, retracted or bulging.     Nose: No mucosal edema or rhinorrhea.     Right Sinus: No maxillary sinus tenderness or frontal sinus tenderness.     Left Sinus: No maxillary sinus tenderness or frontal sinus tenderness.     Mouth/Throat:     Pharynx: Uvula midline.  Eyes:     General: Lids are normal. Lids are everted, no foreign bodies appreciated.     Conjunctiva/sclera: Conjunctivae normal.     Pupils: Pupils are equal, round, and reactive to light.  Neck:     Thyroid : No thyroid  mass or thyromegaly.     Vascular: No carotid bruit.     Trachea: Trachea normal.  Cardiovascular:     Rate and Rhythm: Normal rate and regular rhythm.     Pulses: Normal pulses.     Heart sounds: Normal heart sounds, S1 normal and S2 normal. No murmur heard.    No friction rub. No gallop.  Pulmonary:     Effort: Pulmonary effort is normal. No tachypnea or respiratory distress.     Breath sounds: Normal breath sounds. No decreased breath sounds, wheezing, rhonchi or rales.  Abdominal:     General: Bowel sounds are normal.     Palpations: Abdomen is soft.     Tenderness: There is no abdominal tenderness.  Musculoskeletal:     Cervical back: Normal range of motion and neck supple.  Skin:    General: Skin is warm and dry.     Findings: No rash.  Neurological:     Mental Status: She is alert.  Psychiatric:        Mood and Affect: Mood is not anxious or depressed.        Speech:  Speech normal.        Behavior: Behavior normal. Behavior is cooperative.        Thought Content: Thought content normal.        Judgment: Judgment normal.       Results for orders placed or performed in visit on 04-Feb-2024  B. burgdorfi antibodies by [facility]   Collection Time: 02-04-2024  3:42 PM  Result Value Ref Range   B burgdorferi IgG Abs (IB) NEGATIVE NEGATIVE   Lyme Disease 18 kD IgG NON-REACTIVE    Lyme Disease 23 kD IgG NON-REACTIVE    Lyme Disease 28 kD IgG NON-REACTIVE    Lyme Disease 30 kD IgG NON-REACTIVE    Lyme Disease 39 kD IgG NON-REACTIVE    Lyme Disease 41 kD IgG NON-REACTIVE    Lyme Disease 45 kD IgG NON-REACTIVE    Lyme Disease 58 kD IgG NON-REACTIVE    Lyme Disease 66 kD IgG NON-REACTIVE    Lyme Disease 93 kD IgG NON-REACTIVE    B burgdorferi IgM Abs (IB) NEGATIVE NEGATIVE   Lyme Disease 23 kD IgM NON-REACTIVE    Lyme Disease 39 kD IgM NON-REACTIVE    Lyme Disease 41 kD IgM NON-REACTIVE   Rocky mtn spotted fvr abs pnl(IgG+IgM)   Collection Time: 01/27/24  3:42 PM  Result Value Ref Range   RMSF IgG Not Detected Not Detected   RMSF IgM Not Detected Not Detected    Assessment and Plan  There are no diagnoses linked to this encounter.  No follow-ups on file.   Greig Ring, MD

## 2024-02-07 NOTE — Assessment & Plan Note (Signed)
 Chronic, recent worsening likely due to excessive salt intake.  She has reduced this and restarted amlodipine  5 mg p.o. daily.  Blood pressure significantly improved at today's office visit. Continue to encourage smoking cessation and lifestyle changes. Amlodipine  5 mg p.o. daily  Return and ER precautions provided.

## 2024-02-27 DIAGNOSIS — Z1231 Encounter for screening mammogram for malignant neoplasm of breast: Secondary | ICD-10-CM | POA: Diagnosis not present

## 2024-02-27 LAB — HM MAMMOGRAPHY

## 2024-02-28 ENCOUNTER — Encounter: Payer: Self-pay | Admitting: Family Medicine

## 2024-02-28 ENCOUNTER — Ambulatory Visit: Payer: Self-pay | Admitting: Family Medicine

## 2024-03-15 ENCOUNTER — Other Ambulatory Visit: Payer: Self-pay | Admitting: Family Medicine

## 2024-03-27 ENCOUNTER — Other Ambulatory Visit: Payer: Self-pay | Admitting: Family Medicine

## 2024-04-10 ENCOUNTER — Telehealth: Payer: Self-pay | Admitting: *Deleted

## 2024-04-10 ENCOUNTER — Ambulatory Visit: Payer: Self-pay

## 2024-04-10 DIAGNOSIS — E538 Deficiency of other specified B group vitamins: Secondary | ICD-10-CM

## 2024-04-10 DIAGNOSIS — E7800 Pure hypercholesterolemia, unspecified: Secondary | ICD-10-CM

## 2024-04-10 DIAGNOSIS — E038 Other specified hypothyroidism: Secondary | ICD-10-CM

## 2024-04-10 DIAGNOSIS — E559 Vitamin D deficiency, unspecified: Secondary | ICD-10-CM

## 2024-04-10 NOTE — Telephone Encounter (Signed)
-----   Message from Veva JINNY Ferrari sent at 04/10/2024 11:53 AM EST ----- Regarding: Lab orders for MON, 11.24.25 Patient is scheduled for CPX labs, please order future labs, Thanks , Veva

## 2024-04-10 NOTE — Telephone Encounter (Signed)
 FYI Only or Action Required?: Action required by provider: clinical question for provider.  Patient was last seen in primary care on 02/07/2024 by Avelina Greig BRAVO, MD.  Called Nurse Triage reporting Cough.  Symptoms began yesterday.  Symptoms are: unchanged.  Triage Disposition: See Physician Within 24 Hours  Patient/caregiver understands and will follow disposition?: No, wishes to speak with PCP     Copied from CRM #[id number]. Topic: Clinical - Red Word Triage >> Apr 10, 2024 10:58 AM Re[facility] ORN wrote: Red Word that prompted transfer to Nurse Triage:  fever 100.7, headache, chest congestion, coughing up mucus. Sx began yesterday       Reason for Disposition  [1] Known COPD or other severe lung disease (i.e., bronchiectasis, cystic fibrosis, lung surgery) AND [2] symptoms getting worse (i.e., increased sputum purulence or amount, increased breathing difficulty  Answer Assessment - Initial Assessment Questions Patient declined an appointment and wanted to see if an antibiotic could be called in, stating she usually gets a sinus infection or bronchitis every year and this fees similar. Please advise.     1. ONSET: When did the cough begin?      1 day ago 2. SEVERITY: How bad is the cough today?      Mild to moderate  3. SPUTUM: Describe the color of your sputum (e.g., none, dry cough; clear, white, yellow, green)     Clear 4. HEMOPTYSIS: Are you coughing up any blood? If Yes, ask: How much? (e.g., flecks, streaks, tablespoons, etc.)     No 5. DIFFICULTY BREATHING: Are you having difficulty breathing? If Yes, ask: How bad is it? (e.g., mild, moderate, severe)      No 6. FEVER: Do you have a fever? If Yes, ask: What is your temperature, how was it measured, and when did it start?     100.7 F 7. CARDIAC HISTORY: Do you have any history of heart disease? (e.g., heart attack, congestive heart failure)      Yes 8. LUNG HISTORY: Do you have any history of lung  disease?  (e.g., pulmonary embolus, asthma, emphysema)     yes 9. PE RISK FACTORS: Do you have a history of blood clots? (or: recent major surgery, recent prolonged travel, bedridden)     No 10. OTHER SYMPTOMS: Do you have any other symptoms? (e.g., runny nose, wheezing, chest pain)       Nasal congestion, headache  Protocols used: Cough - Acute Productive-A-AH

## 2024-04-11 ENCOUNTER — Ambulatory Visit
Admission: RE | Admit: 2024-04-11 | Discharge: 2024-04-11 | Disposition: A | Discharge status: Home / Self Care | Source: Ambulatory Visit | Attending: Emergency Medicine | Admitting: Emergency Medicine

## 2024-04-11 VITALS — BP 140/71 | HR 85 | Temp 98.2°F | Resp 20

## 2024-04-11 DIAGNOSIS — B349 Viral infection, unspecified: Secondary | ICD-10-CM | POA: Diagnosis not present

## 2024-04-11 MED ORDER — PREDNISONE 10 MG (21) PO TBPK: ORAL_TABLET | Freq: Every day | ORAL | 0 refills | Status: DC

## 2024-04-11 MED ORDER — AMOXICILLIN 500 MG PO CAPS: 500.0000 mg | ORAL_CAPSULE | Freq: Two times a day (BID) | ORAL | 0 refills | Status: AC

## 2024-04-11 NOTE — Telephone Encounter (Signed)
 SABRA

## 2024-04-11 NOTE — ED Triage Notes (Signed)
 Patient reports cough, headache, fever and chest congestion x 3 days. Patient took Tylenol  and Aleve with mild relief. Patient denies pain at this time. Reports headache has resolved .

## 2024-04-11 NOTE — Telephone Encounter (Signed)
 Noted

## 2024-04-11 NOTE — ED Provider Notes (Signed)
 CAY [facility]AL[facility] PELT    CSN: [id number] Arrival date & time: 04/11/24  1518      History   Chief Complaint Chief Complaint  Patient presents with   Cough    Entered by patient    HPI Tiffany Orozco is a 73 y.o. female.   Patient presents for evaluation of fever peaking at 102, nasal congestion, sinus pain and pressure underneath the eyes and a productive cough with clear mucus present for 3 days.  Associated nausea without vomiting but has been tolerable to food and liquids.  Has attempted use of Mucinex  a day and night and Tylenol /, Aleve.  No known sick contact prior.  Denies shortness of breath or wheezing worse than baseline.  History of COPD, daily tobacco use.   Past Medical History:  Diagnosis Date   Allergy    Anxiety    COPD (chronic obstructive pulmonary disease) (HCC)    DDD (degenerative disc disease), lumbar    mild   Depression    Female rectocele without uterine prolapse    GERD (gastroesophageal reflux disease)    Hearing loss    Heart murmur    Hyperlipidemia    Hypertension    Hypothyroidism    IBS (irritable bowel syndrome)    Influenza A 06/2015   Intractable nausea and vomiting 03/01/2022   Obesity    Osteopenia    Plantar fasciitis    Pneumonia    Skin cancer of arm    Skin cancer of face    Stroke (HCC) 07/2010   no permenant deficits   Thrombocytopenia    Tubal pregnancy     Patient Active Problem List   Diagnosis Date Noted   Statin intolerance 04/14/2023   Schatzki's ring 03/10/2022   Aortic atherosclerosis 02/16/2021   Status post repair of ventral hernia 05/08/2020   Umbilical hernia without obstruction and without gangrene    Gastroesophageal reflux disease with esophagitis without hemorrhage 09/02/2016   PUD (peptic ulcer disease) 09/02/2016   Coronary atherosclerosis of native coronary artery 05/04/2016   Tobacco abuse 06/09/2015   COPD with acute exacerbation (HCC) 06/08/2015   History of CVA (cerebrovascular accident)  06/08/2015   IBS (irritable bowel syndrome) 06/08/2015   Dental caries 10/03/2012   HTN (hypertension) 11/30/2011   Herniation of rectum into vagina 08/29/2011   Hyponatremia 03/26/2011   Acute non-recurrent maxillary sinusitis 02/18/2011   CONSTIPATION, CHRONIC 03/19/2010   Osteopenia 03/19/2010   Chronic bilateral low back pain with bilateral sciatica 02/24/2010   UNSPECIFIED CARDIAC DYSRHYTHMIA 03/06/2009   Allergic rhinitis 08/14/2007   Emphysema lung (HCC) 08/14/2007   Hypothyroidism 02/27/2007   HYPERCHOLESTEROLEMIA 02/27/2007   Generalized anxiety disorder 02/27/2007   Depression, major, single episode, moderate (HCC) 02/27/2007    Past Surgical History:  Procedure Laterality Date   APPENDECTOMY     CHOLECYSTECTOMY  2004   COLONOSCOPY     COLONOSCOPY WITH PROPOFOL  N/A 10/21/2022   Procedure: COLONOSCOPY WITH PROPOFOL ;  Surgeon: Onita Elspeth Sharper, DO;  Location: [facility] ENDOSCOPY;  Service: Gastroenterology;  Laterality: N/A;   CYST EXCISION Right    breast mole   EMGs  11/2002   atms and wrists neg   ESOPHAGEAL MANOMETRY N/A 03/17/2015   Procedure: ESOPHAGEAL MANOMETRY (EM);  Surgeon: Elspeth Deward Naval, MD;  Location: [facility] ENDOSCOPY;  Service: Gastroenterology;  Laterality: N/A;   ESOPHAGOGASTRODUODENOSCOPY N/A 03/02/2022   Procedure: ESOPHAGOGASTRODUODENOSCOPY (EGD);  Surgeon: Onita Elspeth Sharper, DO;  Location: [facility] ENDOSCOPY;  Service: Gastroenterology;  Laterality: N/A;   ESOPHAGOGASTRODUODENOSCOPY (  EGD) WITH PROPOFOL  N/A 05/06/2022   Procedure: ESOPHAGOGASTRODUODENOSCOPY (EGD) WITH PROPOFOL ;  Surgeon: Onita Elspeth Sharper, DO;  Location: [facility] ENDOSCOPY;  Service: Gastroenterology;  Laterality: N/A;   FOOT SURGERY Right 2008, 2009, 2010   torn ligament, hammertoes   HERNIA REPAIR     TUBAL LIGATION  1975   UPPER GI ENDOSCOPY     XI ROBOTIC ASSISTED VENTRAL HERNIA N/A 04/14/2020   Procedure: XI ROBOTIC ASSISTED VENTRAL HERNIA;  Surgeon: Marolyn Nest, MD;   Location: [facility] ORS;  Service: General;  Laterality: N/A;    OB History     Gravida  5   Para  2   Term  2   Preterm  0   AB  3   Living  2      SAB  2   IAB  0   Ectopic  1   Multiple  0   Live Births  2            Home Medications    Prior to Admission medications   Medication Sig Start Date End Date Taking? Authorizing Provider  albuterol  (VENTOLIN  HFA) 108 (90 Base) MCG/ACT inhaler Inhale 2 puffs into the lungs every 6 (six) hours as needed for wheezing or shortness of breath. 10/28/23   Bedsole, Amy E, MD  amLODipine  (NORVASC ) 5 MG tablet Take 1 tablet (5 mg total) by mouth daily. 02/07/24   Bedsole, Greig BRAVO, MD  Boswellia Serrata (BOSWELLIA PO) Take 1 capsule by mouth daily.    [provider]  Cholecalciferol (VITAMIN D3) 125 MCG (5000 UT) TABS Take 5,000 Units by mouth daily.    [provider]  dipyridamole -aspirin  (AGGRENOX ) 200-25 MG 12hr capsule TAKE 1 CAPSULE BY MOUTH TWICE A DAY 05/19/23   Bedsole, Amy E, MD  ezetimibe  (ZETIA ) 10 MG tablet Take 1 tablet (10 mg total) by mouth daily. 04/21/23   Bedsole, Amy E, MD  fluticasone  (FLONASE ) 50 MCG/ACT nasal spray SPRAY 2 SPRAYS INTO EACH NOSTRIL EVERY DAY 03/15/24   Bedsole, Amy E, MD  levothyroxine  (SYNTHROID ) 75 MCG tablet TAKE 1 TABLET BY MOUTH EVERY DAY 06/05/23   Bedsole, Amy E, MD  pantoprazole  (PROTONIX ) 40 MG tablet Take 1 tablet (40 mg total) by mouth daily. 02/07/24   Bedsole, Amy E, MD  predniSONE  (DELTASONE ) 20 MG tablet Take 2 tablets by mouth once daily in the morning for 5 days. 12/19/23   Sofia, Leslie K, PA-C  Turmeric (QC TUMERIC COMPLEX PO) Take by mouth.    [provider]    Family History Family History  Problem Relation Age of Onset   Skin cancer Father    Hypertension Mother    Lung cancer Brother    Colon cancer Sister    Esophageal cancer Sister    Ovarian cancer Sister    Stomach cancer Neg Hx    Pancreatic cancer Neg Hx     Social History Social  History   Tobacco Use   Smoking status: Every Day    Current packs/day: 0.75    Average packs/day: 0.8 packs/day for 31.0 years (23.3 ttl pk-yrs)    Types: Cigarettes   Smokeless tobacco: Never   Tobacco comments:    States she is not smoking during current illness 05/29/18 sb  Vaping Use   Vaping status: Never Used  Substance Use Topics   Alcohol use: No    Alcohol/week: 0.0 standard drinks of alcohol   Drug use: No     Allergies   Lisinopril , Niacin,  Welchol  [colesevelam  hcl], Augmentin  [amoxicillin -pot clavulanate], and Oxycodone hcl   Review of Systems Review of Systems  Constitutional:  Positive for fever. Negative for activity change, appetite change, chills, diaphoresis, fatigue and unexpected weight change.  HENT:  Positive for congestion, sinus pressure and sinus pain. Negative for dental problem, drooling, ear discharge, ear pain, facial swelling, hearing loss, mouth sores, nosebleeds, postnasal drip, rhinorrhea, sneezing, sore throat, tinnitus, trouble swallowing and voice change.   Respiratory:  Positive for cough. Negative for apnea, choking, chest tightness, shortness of breath, wheezing and stridor.   Cardiovascular: Negative.   Gastrointestinal: Negative.   Neurological: Negative.      Physical Exam Triage Vital Signs ED Triage Vitals  Encounter Vitals Group     BP 04/11/24 1544 (!) 140/71     Girls Systolic BP Percentile --      Girls Diastolic BP Percentile --      Boys Systolic BP Percentile --      Boys Diastolic BP Percentile --      Pulse Rate 04/11/24 1544 85     Resp 04/11/24 1544 20     Temp 04/11/24 1544 98.2 F (36.8 C)     Temp Source 04/11/24 1544 Oral     SpO2 04/11/24 1544 97 %     Weight --      Height --      Head Circumference --      Peak Flow --      Pain Score 04/11/24 1541 0     Pain Loc --      Pain Education --      Exclude from Growth Chart --    No data found.  Updated Vital Signs BP (!) 140/71 (BP Location: Left  Arm)   Pulse 85   Temp 98.2 F (36.8 C) (Oral)   Resp 20   SpO2 97%   Visual Acuity Right Eye Distance:   Left Eye Distance:   Bilateral Distance:    Right Eye Near:   Left Eye Near:    Bilateral Near:     Physical Exam Constitutional:      Appearance: Normal appearance.  HENT:     Right Ear: Tympanic membrane, ear canal and external ear normal.     Left Ear: Tympanic membrane, ear canal and external ear normal.     Nose: Congestion present.     Mouth/Throat:     Mouth: Mucous membranes are moist.     Pharynx: Oropharynx is clear. No oropharyngeal exudate or posterior oropharyngeal erythema.  Eyes:     Extraocular Movements: Extraocular movements intact.  Cardiovascular:     Rate and Rhythm: Normal rate.     Pulses: Normal pulses.     Heart sounds: Normal heart sounds.  Pulmonary:     Effort: Pulmonary effort is normal.     Breath sounds: Normal breath sounds.  Neurological:     Mental Status: She is alert and oriented to person, place, and time. Mental status is at baseline.      UC Treatments / Results  Labs (all labs ordered are listed, but only abnormal results are displayed) Labs Reviewed  POC COVID19/FLU A&B COMBO    EKG   Radiology No results found.  Procedures Procedures (including critical care time)  Medications Ordered in UC Medications - No data to display  Initial Impression / Assessment and Plan / UC Course  I have reviewed the triage vital signs and the nursing notes.  Pertinent labs & imaging results that  were available during my care of the patient were reviewed by me and considered in my medical decision making (see chart for details).  Viral illness  Patient is in no signs of distress nor toxic appearing.  Vital signs are stable.  Low suspicion for pneumonia, pneumothorax or bronchitis and therefore will defer imaging.  Etiology most likely viral, declined COVID and flu testing stated I know I do not have either of those.   Requesting antibiotics, endorses typically having to take during illness, obliged, prescribed amoxicillin  empirically and prescribed prednisone  for further management of upper respiratory system, declined cough prescription. May use additional over-the-counter medications as needed for supportive care.  May follow-up with urgent care as needed if symptoms persist or worsen.   Final Clinical Impressions(s) / UC Diagnoses   Final diagnoses:  Viral illness   Discharge Instructions   None    ED Prescriptions   None    PDMP not reviewed this encounter.   Teresa Shelba SAUNDERS, NP 04/11/24 [phone]

## 2024-04-11 NOTE — Telephone Encounter (Signed)
 I  spoke with pt ands pts daughter. No available appts at [facility] and [facility]; offered appt at 3 different [facility] offices in [place] and was advised by daughter that was too far to go. Scheduled pt at [facility] 04/11/24 at 3:15 and if needed to be seen prior to that pt will walkin at [facility]. Sending note to Dr Avelina and [facility].

## 2024-04-11 NOTE — Discharge Instructions (Signed)
 Your symptoms today are most likely being caused by a virus and should steadily improve in time it can take up to 7 to 10 days before you truly start to see a turnaround however things will get better  Due to your history of COPD as a precaution we have started you on antibiotics, take amoxicillin  twice daily for 7 days  To keep the airway open and relaxed begin prednisone  as directed, avoid ibuprofen  while taking    You can take Tylenol  as needed for fever reduction and pain relief.   For cough: honey 1/2 to 1 teaspoon (you can dilute the honey in water or another fluid).  You can also use guaifenesin  and dextromethorphan  for cough. You can use a humidifier for chest congestion and cough.  If you don't have a humidifier, you can sit in the bathroom with the hot shower running.      For sore throat: try warm salt water gargles, cepacol lozenges, throat spray, warm tea or water with lemon/honey, popsicles or ice, or OTC cold relief medicine for throat discomfort.   For congestion: take a daily anti-histamine like Zyrtec , Claritin, and a oral decongestant, such as pseudoephedrine.  You can also use Flonase  1-2 sprays in each nostril daily.   It is important to stay hydrated: drink plenty of fluids (water, gatorade/powerade/pedialyte, juices, or teas) to keep your throat moisturized and help further relieve irritation/discomfort.

## 2024-04-18 ENCOUNTER — Ambulatory Visit: Payer: 59

## 2024-04-18 VITALS — Ht 60.5 in | Wt 174.0 lb

## 2024-04-18 DIAGNOSIS — Z13820 Encounter for screening for osteoporosis: Secondary | ICD-10-CM

## 2024-04-18 DIAGNOSIS — Z0000 Encounter for general adult medical examination without abnormal findings: Secondary | ICD-10-CM | POA: Diagnosis not present

## 2024-04-18 NOTE — Progress Notes (Signed)
 Chief Complaint  Patient presents with   Medicare Wellness     Subjective:   Tiffany Orozco is a 73 y.o. female who presents for a Medicare Annual Wellness Visit.  Allergies (verified) Lisinopril , Niacin, Welchol  [colesevelam  hcl], Augmentin  [amoxicillin -pot clavulanate], and Oxycodone hcl   History: Past Medical History:  Diagnosis Date   Allergy    Anxiety    COPD (chronic obstructive pulmonary disease) (HCC)    DDD (degenerative disc disease), lumbar    mild   Depression    Female rectocele without uterine prolapse    GERD (gastroesophageal reflux disease)    Hearing loss    Heart murmur    Hyperlipidemia    Hypertension    Hypothyroidism    IBS (irritable bowel syndrome)    Influenza A 06/2015   Intractable nausea and vomiting 03/01/2022   Obesity    Osteopenia    Plantar fasciitis    Pneumonia    Skin cancer of arm    Skin cancer of face    Stroke (HCC) 07/2010   no permenant deficits   Thrombocytopenia    Tubal pregnancy    Past Surgical History:  Procedure Laterality Date   APPENDECTOMY     CHOLECYSTECTOMY  2004   COLONOSCOPY     COLONOSCOPY WITH PROPOFOL  N/A 10/21/2022   Procedure: COLONOSCOPY WITH PROPOFOL ;  Surgeon: Onita Elspeth Sharper, DO;  Location: [facility] ENDOSCOPY;  Service: Gastroenterology;  Laterality: N/A;   CYST EXCISION Right    breast mole   EMGs  11/2002   atms and wrists neg   ESOPHAGEAL MANOMETRY N/A 03/17/2015   Procedure: ESOPHAGEAL MANOMETRY (EM);  Surgeon: Elspeth Deward Naval, MD;  Location: [facility] ENDOSCOPY;  Service: Gastroenterology;  Laterality: N/A;   ESOPHAGOGASTRODUODENOSCOPY N/A 03/02/2022   Procedure: ESOPHAGOGASTRODUODENOSCOPY (EGD);  Surgeon: Onita Elspeth Sharper, DO;  Location: [facility] ENDOSCOPY;  Service: Gastroenterology;  Laterality: N/A;   ESOPHAGOGASTRODUODENOSCOPY (EGD) WITH PROPOFOL  N/A 05/06/2022   Procedure: ESOPHAGOGASTRODUODENOSCOPY (EGD) WITH PROPOFOL ;  Surgeon: Onita Elspeth Sharper, DO;  Location: [facility]  ENDOSCOPY;  Service: Gastroenterology;  Laterality: N/A;   FOOT SURGERY Right 2008, 2009, 2010   torn ligament, hammertoes   HERNIA REPAIR     TUBAL LIGATION  1975   UPPER GI ENDOSCOPY     XI ROBOTIC ASSISTED VENTRAL HERNIA N/A 04/14/2020   Procedure: XI ROBOTIC ASSISTED VENTRAL HERNIA;  Surgeon: Marolyn Nest, MD;  Location: [facility] ORS;  Service: General;  Laterality: N/A;   Family History  Problem Relation Age of Onset   Skin cancer Father    Hypertension Mother    Lung cancer Brother    Colon cancer Sister    Esophageal cancer Sister    Ovarian cancer Sister    Stomach cancer Neg Hx    Pancreatic cancer Neg Hx    Social History   Occupational History   Occupation: bookkeeper    Associate Professor: UNEMPLOYED    Comment: 10/10 not working x 9 years  Tobacco Use   Smoking status: Every Day    Current packs/day: 0.75    Average packs/day: 0.8 packs/day for 31.0 years (23.3 ttl pk-yrs)    Types: Cigarettes   Smokeless tobacco: Never   Tobacco comments:    States she is not smoking during current illness 05/29/18 sb  Vaping Use   Vaping status: Never Used  Substance and Sexual Activity   Alcohol use: No    Alcohol/week: 0.0 standard drinks of alcohol   Drug use: No   Sexual activity: Yes  Birth control/protection: Post-menopausal   Tobacco Counseling Ready to quit: Not Answered Counseling given: Not Answered Tobacco comments: States she is not smoking during current illness 05/29/18 sb  SDOH Screenings   Food Insecurity: No Food Insecurity (04/18/2024)  Housing: Unknown (04/18/2024)  Transportation Needs: No Transportation Needs (04/18/2024)  Utilities: Not At Risk (04/18/2024)  Alcohol Screen: Low Risk  (04/18/2024)  Depression (PHQ2-9): Low Risk  (04/18/2024)  Financial Resource Strain: Low Risk  (04/18/2024)  Physical Activity: Sufficiently Active (04/18/2024)  Social Connections: Socially Isolated (04/18/2024)  Stress: No Stress Concern Present (04/18/2024)   Tobacco Use: High Risk (04/18/2024)  Health Literacy: Adequate Health Literacy (04/18/2024)   See flowsheets for full screening details  Depression Screen PHQ 2 & 9 Depression Scale- Over the past 2 weeks, how often have you been bothered by any of the following problems? Little interest or pleasure in doing things: 0 Feeling down, depressed, or hopeless (PHQ Adolescent also includes...irritable): 0 PHQ-2 Total Score: 0 Trouble falling or staying asleep, or sleeping too much: 0 Feeling tired or having little energy: 0 Poor appetite or overeating (PHQ Adolescent also includes...weight loss): 0 Feeling bad about yourself - or that you are a failure or have let yourself or your family down: 0 Trouble concentrating on things, such as reading the newspaper or watching television (PHQ Adolescent also includes...like school work): 0 Moving or speaking so slowly that other people could have noticed. Or the opposite - being so fidgety or restless that you have been moving around a lot more than usual: 0 Thoughts that you would be better off dead, or of hurting yourself in some way: 0 PHQ-9 Total Score: 0 If you checked off any problems, how difficult have these problems made it for you to do your work, take care of things at home, or get along with other people?: Not difficult at all  Depression Treatment Depression Interventions/Treatment : Counseling     Goals Addressed             This Visit's Progress    COMPLETED: DIET - EAT MORE FRUITS AND VEGETABLES       COMPLETED: Increase water intake       Starting 11/03/16, I will attempt to drink at least 6-8 glasses of water daily.      No new goals       COMPLETED: Patient Stated       Would like to get teeth fixed due to previous illness.       Visit info / Clinical Intake: Medicare Wellness Visit Type:: Subsequent Annual Wellness Visit Persons participating in visit:: patient Medicare Wellness Visit Mode:: Telephone If  telephone:: video declined Because this visit was a virtual/telehealth visit:: unable to obtan vitals due to lack of equipment If Telephone or Video please confirm:: I discussed the limitations of evaluation and management by telemedicine Patient Location:: home Provider Location:: clinic Information given by:: patient Interpreter Needed?: No Pre-visit prep was completed: yes AWV questionnaire completed by patient prior to visit?: no Living arrangements:: with family/others Patient's Overall Health Status Rating: good Typical amount of pain: some Does pain affect daily life?: (!) yes Are you currently prescribed opioids?: no  Dietary Habits and Nutritional Risks How many meals a day?: 3 Eats fruit and vegetables daily?: yes Most meals are obtained by: preparing own meals In the last 2 weeks, have you had any of the following?: (!) nausea, vomiting, diarrhea (diarrhea 3xweek) Diabetic:: no  Functional Status Activities of Daily Living (to include ambulation/medication): Independent  Ambulation: Independent Medication Administration: Independent Home Management: Independent Manage your own finances?: yes Primary transportation is: driving Concerns about vision?: no *vision screening is required for WTM* Concerns about hearing?: (!) yes Uses hearing aids?: (!) yes Hear whispered voice?: (!) no *in-person visit only*  Fall Screening Falls in the past year?: 0 Number of falls in past year: 0 Was there an injury with Fall?: 0 Fall Risk Category Calculator: 0 Patient Fall Risk Level: Low Fall Risk  Fall Risk Patient at Risk for Falls Due to: No Fall Risks Fall risk Follow up: Education provided; Falls prevention discussed  Home and Transportation Safety: All rugs have non-skid backing?: N/A, no rugs All stairs or steps have railings?: (!) no (3 steps at front, side and back) Grab bars in the bathtub or shower?: (!) no Have non-skid surface in bathtub or shower?: yes Good  home lighting?: yes Regular seat belt use?: yes Hospital stays in the last year:: no  Cognitive Assessment Difficulty concentrating, remembering, or making decisions? : no Will 6CIT or Mini Cog be Completed: yes What year is it?: 0 points What month is it?: 0 points Give patient an address phrase to remember (5 components): [address] [place]  About what time is it?: 0 points Count backwards from 20 to 1: 0 points Say the months of the year in reverse: 0 points Repeat the address phrase from earlier: 0 points 6 CIT Score: 0 points  Advance Directives (For Healthcare) Does Patient Have a Medical Advance Directive?: No  Reviewed/Updated  Reviewed/Updated: Reviewed All (Medical, Surgical, Family, Medications, Allergies, Care Teams, Patient Goals)        Objective:    Today's Vitals   04/18/24 0851  Weight: 174 lb (78.9 kg)  Height: 5' 0.5 (1.537 m)   Body mass index is 33.42 kg/m.  Current Medications (verified) Outpatient Encounter Medications as of 04/18/2024  Medication Sig   albuterol  (VENTOLIN  HFA) 108 (90 Base) MCG/ACT inhaler Inhale 2 puffs into the lungs every 6 (six) hours as needed for wheezing or shortness of breath.   Boswellia Serrata (BOSWELLIA PO) Take 1 capsule by mouth daily.   Cholecalciferol (VITAMIN D3) 125 MCG (5000 UT) TABS Take 5,000 Units by mouth daily.   dipyridamole -aspirin  (AGGRENOX ) 200-25 MG 12hr capsule TAKE 1 CAPSULE BY MOUTH TWICE A DAY   ezetimibe  (ZETIA ) 10 MG tablet Take 1 tablet (10 mg total) by mouth daily.   fluticasone  (FLONASE ) 50 MCG/ACT nasal spray SPRAY 2 SPRAYS INTO EACH NOSTRIL EVERY DAY   levothyroxine  (SYNTHROID ) 75 MCG tablet TAKE 1 TABLET BY MOUTH EVERY DAY   pantoprazole  (PROTONIX ) 40 MG tablet Take 1 tablet (40 mg total) by mouth daily.   Turmeric (QC TUMERIC COMPLEX PO) Take by mouth.   amLODipine  (NORVASC ) 5 MG tablet Take 1 tablet (5 mg total) by mouth daily. (Patient not taking: Reported on 04/18/2024)    amoxicillin  (AMOXIL ) 500 MG capsule Take 1 capsule (500 mg total) by mouth 2 (two) times daily for 7 days. (Patient not taking: Reported on 04/18/2024)   predniSONE  (STERAPRED UNI-PAK 21 TAB) 10 MG (21) TBPK tablet Take by mouth daily. Take 6 tabs by mouth daily  for 1 days, then 5 tabs for 1 days, then 4 tabs for 1 days, then 3 tabs for 1 days, 2 tabs for 1 days, then 1 tab by mouth daily for 1 days (Patient not taking: Reported on 04/18/2024)   No facility-administered encounter medications on file as of 04/18/2024.   Hearing/Vision screen Vision  Screening - Comments:: UTD w/visits to [facility] Immunizations and Health Maintenance Health Maintenance  Topic Date Due   Zoster Vaccines- Shingrix (1 of 2) 10/11/1998   DTaP/Tdap/Td (3 - Tdap) 03/07/2019   DEXA SCAN  12/18/2023   COVID-19 Vaccine (3 - 2025-26 season) 01/30/2024   Medicare Annual Wellness (AWV)  04/17/2024   Influenza Vaccine  08/28/2024 (Originally 12/30/2023)   Lung Cancer Screening  09/26/2024   Mammogram  02/26/2025   Colonoscopy  10/20/2032   Pneumococcal Vaccine: 50+ Years  Completed   Hepatitis C Screening  Completed   Meningococcal B Vaccine  Aged Out        Assessment/Plan:  This is a routine wellness examination for Johnnye.  Patient Care Team: Avelina Greig BRAVO, MD as PCP - [facility]  I have personally reviewed and noted the following in the patient's chart:   Medical and social history Use of alcohol, tobacco or illicit drugs  Current medications and supplements including opioid prescriptions. Functional ability and status Nutritional status Physical activity Advanced directives List of other physicians Hospitalizations, surgeries, and ER visits in previous 12 months Vitals Screenings to include cognitive, depression, and falls Referrals and appointments  No orders of the defined types were placed in this encounter.  In addition, I have reviewed and discussed with  patient certain preventive protocols, quality metrics, and best practice recommendations. A written personalized care plan for preventive services as well as general preventive health recommendations were provided to patient.   Erminio LITTIE Saris, LPN   88/[id number]/7974    After Visit Summary: (Declined) Due to this being a telephonic visit, with patients personalized plan was offered to patient but patient Declined AVS at this time   Nurse Notes: Pt has no concerns or questions. AWV/CPE made simultaneously/same day for next year

## 2024-04-18 NOTE — Patient Instructions (Addendum)
 Ms. Briddell,  Thank you for taking the time for your Medicare Wellness Visit. I appreciate your continued commitment to your health goals. Please review the care plan we discussed, and feel free to reach out if I can assist you further.  Please note that Annual Wellness Visits do not include a physical exam. Some assessments may be limited, especially if the visit was conducted virtually. If needed, we may recommend an in-person follow-up with your provider.  Ongoing Care Seeing your primary care provider every 3 to 6 months helps us  monitor your health and provide consistent, personalized care.   Referrals If a referral was made during today's visit and you haven't received any updates within two weeks, please contact the referred provider directly to check on the status.  You have an order for:  []   2D Mammogram  []   3D Mammogram  [x]   Bone Density     Please call for appointment:  [facility] [address] [place], [place] [address] [phone]  [facility] [address] [place], [place] [address] [phone]  [facility] [address] [place], [place] [address] [phone]  [facility] Bone Density [address] [place], [place] [address] [phone]  [facility] [address] [place], [place] [address] [phone]    Make sure to wear two-piece clothing.  No lotions, powders, or deodorants the day of the appointment. Make sure to bring picture ID and insurance card.  Bring list of medications you are currently taking including any supplements.    Recommended Screenings:  Health Maintenance  Topic Date Due   Zoster (Shingles) Vaccine (1 of 2) 10/11/1998   DTaP/Tdap/Td vaccine (3 - Tdap) 03/07/2019   DEXA scan (bone density measurement)  12/18/2023   COVID-19 Vaccine (3 - 2025-26 season) 01/30/2024   Medicare Annual Wellness Visit   04/17/2024   Flu Shot  08/28/2024*   Screening for Lung Cancer  09/26/2024   Breast Cancer Screening  02/26/2025   Colon Cancer Screening  10/20/2032   Pneumococcal Vaccine for age over 8  Completed   Hepatitis C Screening  Completed   Meningitis B Vaccine  Aged Out  *Topic was postponed. The date shown is not the original due date.       04/18/2024    9:01 AM  Advanced Directives  Does Patient Have a Medical Advance Directive? No    Vision: Annual vision screenings are recommended for early detection of glaucoma, cataracts, and diabetic retinopathy. These exams can also reveal signs of chronic conditions such as diabetes and high blood pressure.  Dental: Annual dental screenings help detect early signs of oral cancer, gum disease, and other conditions linked to overall health, including heart disease and diabetes.

## 2024-04-23 ENCOUNTER — Other Ambulatory Visit (INDEPENDENT_AMBULATORY_CARE_PROVIDER_SITE_OTHER)

## 2024-04-23 DIAGNOSIS — E7800 Pure hypercholesterolemia, unspecified: Secondary | ICD-10-CM

## 2024-04-23 DIAGNOSIS — E559 Vitamin D deficiency, unspecified: Secondary | ICD-10-CM

## 2024-04-23 DIAGNOSIS — E038 Other specified hypothyroidism: Secondary | ICD-10-CM

## 2024-04-23 DIAGNOSIS — E538 Deficiency of other specified B group vitamins: Secondary | ICD-10-CM | POA: Diagnosis not present

## 2024-04-23 LAB — COMPREHENSIVE METABOLIC PANEL WITH GFR
ALT: 18 U/L (ref 0–35)
AST: 15 U/L (ref 0–37)
Albumin: 4.5 g/dL (ref 3.5–5.2)
Alkaline Phosphatase: 84 U/L (ref 39–117)
BUN: 15 mg/dL (ref 6–23)
CO2: 29 meq/L (ref 19–32)
Calcium: 9.2 mg/dL (ref 8.4–10.5)
Chloride: 97 meq/L (ref 96–112)
Creatinine, Ser: 0.75 mg/dL (ref 0.40–1.20)
GFR: 77.82 mL/min (ref >60.00)
Glucose, Bld: 106 mg/dL — ABNORMAL HIGH (ref 70–99)
Potassium: 4.5 meq/L (ref 3.5–5.1)
Sodium: 134 meq/L — ABNORMAL LOW (ref 135–145)
Total Bilirubin: 0.4 mg/dL (ref 0.2–1.2)
Total Protein: 7 g/dL (ref 6.0–8.3)

## 2024-04-23 LAB — TSH: TSH: 2.14 u[IU]/mL (ref 0.35–5.50)

## 2024-04-23 LAB — T4, FREE: Free T4: 1.23 ng/dL (ref 0.60–1.60)

## 2024-04-23 LAB — VITAMIN D 25 HYDROXY (VIT D DEFICIENCY, FRACTURES): VITD: 43.82 ng/mL (ref 30.00–100.00)

## 2024-04-23 LAB — T3, FREE: T3, Free: 3.3 pg/mL (ref 2.3–4.2)

## 2024-04-23 LAB — LIPID PANEL
Cholesterol: 181 mg/dL (ref 0–200)
HDL: 41.1 mg/dL (ref >39.00)
LDL Cholesterol: 111 mg/dL — ABNORMAL HIGH (ref 0–99)
NonHDL: 139.88
Total CHOL/HDL Ratio: 4
Triglycerides: 144 mg/dL (ref 0.0–149.0)
VLDL: 28.8 mg/dL (ref 0.0–40.0)

## 2024-04-23 LAB — VITAMIN B12: Vitamin B-12: 1380 pg/mL — ABNORMAL HIGH (ref 211–911)

## 2024-04-24 ENCOUNTER — Ambulatory Visit: Payer: Self-pay | Admitting: Family Medicine

## 2024-04-24 NOTE — Progress Notes (Signed)
 No critical labs need to be addressed urgently. We will discuss labs in detail at upcoming office visit.

## 2024-05-01 ENCOUNTER — Encounter: Payer: Self-pay | Admitting: Family Medicine

## 2024-05-01 ENCOUNTER — Ambulatory Visit: Admitting: Family Medicine

## 2024-05-01 VITALS — BP 140/80 | HR 87 | Temp 99.7°F | Ht 58.5 in | Wt 175.1 lb

## 2024-05-01 DIAGNOSIS — E7800 Pure hypercholesterolemia, unspecified: Secondary | ICD-10-CM

## 2024-05-01 DIAGNOSIS — Z0000 Encounter for general adult medical examination without abnormal findings: Secondary | ICD-10-CM

## 2024-05-01 DIAGNOSIS — R202 Paresthesia of skin: Secondary | ICD-10-CM

## 2024-05-01 DIAGNOSIS — I10 Essential (primary) hypertension: Secondary | ICD-10-CM

## 2024-05-01 DIAGNOSIS — F321 Major depressive disorder, single episode, moderate: Secondary | ICD-10-CM

## 2024-05-01 DIAGNOSIS — E871 Hypo-osmolality and hyponatremia: Secondary | ICD-10-CM

## 2024-05-01 DIAGNOSIS — E038 Other specified hypothyroidism: Secondary | ICD-10-CM | POA: Diagnosis not present

## 2024-05-01 DIAGNOSIS — J439 Emphysema, unspecified: Secondary | ICD-10-CM

## 2024-05-01 DIAGNOSIS — M8580 Other specified disorders of bone density and structure, unspecified site: Secondary | ICD-10-CM

## 2024-05-01 DIAGNOSIS — R197 Diarrhea, unspecified: Secondary | ICD-10-CM

## 2024-05-01 DIAGNOSIS — R200 Anesthesia of skin: Secondary | ICD-10-CM | POA: Insufficient documentation

## 2024-05-01 DIAGNOSIS — Z789 Other specified health status: Secondary | ICD-10-CM

## 2024-05-01 DIAGNOSIS — Z720 Tobacco use: Secondary | ICD-10-CM

## 2024-05-01 MED ORDER — AMLODIPINE BESYLATE 5 MG PO TABS: 2.5000 mg | ORAL_TABLET | Freq: Every day | ORAL | 3 refills | Status: DC

## 2024-05-01 NOTE — Assessment & Plan Note (Signed)
 Acute, appears to be resolving. Most likely secondary to recent antibiotics.  Can use probiotic as needed.  Follow-up if not improving as expected. Restart medications that were held during diarrhea.

## 2024-05-01 NOTE — Assessment & Plan Note (Signed)
 Noted incidentally on CT lung cancer screening.  She is a current smoker and has been encouraged to quit but is precontemplative.    We discussed possible treatment of emphysema COPD but she states she is minimally symptomatic without any shortness of breath and less walking long distances.  No daily cough, she reports infrequent infections.

## 2024-05-01 NOTE — Assessment & Plan Note (Signed)
 Side effects to statins in the past.

## 2024-05-01 NOTE — Assessment & Plan Note (Signed)
Chronic, but stable. Limit water intake and increase salt intake.

## 2024-05-01 NOTE — Assessment & Plan Note (Signed)
 Encouraged cessation High blood pressure  Copd  Pt is not interested in quitting Encouraged her to keep thinking about it

## 2024-05-01 NOTE — Patient Instructions (Addendum)
 Restart zetia .  Try amlodipine  1/2 tablet daily , follow BP at home.SABRA goal < 140/90.  Wear carpal tunnel brace at night for 4-6 weeks.  Bone Density planned at [facility].  Stop or decrease Emergen-C given B12 high.  Call if diarrhea is not improving with time.. can consider a probiotic given it is likely due to recent antibiotics.

## 2024-05-01 NOTE — Assessment & Plan Note (Signed)
 Chronic,  Good control on levothyroxine  75 mcg daily.  No longer on [employer] Thyroid 

## 2024-05-01 NOTE — Assessment & Plan Note (Signed)
Chronic, well controlled on no medication 

## 2024-05-01 NOTE — Assessment & Plan Note (Addendum)
 Chronic  Continue to encourage smoking cessation and lifestyle changes.  Amlodipine  5 mg p.o. daily makes her dizzy.Tiffany Orozco will try 1/2 tablet daily. Return and ER precautions provided.

## 2024-05-01 NOTE — Assessment & Plan Note (Signed)
 Acute, most focally right radial side of hand.  Most consistent with carpal tunnel.  Patient's neurologic exam otherwise normal. She will start wearing carpal tunnel brace and right hand at bedtime.  Of note she did mention tingling a few weeks ago in her right face.  Possible TIA given history of CVA but patient continued on Aggrenox .  Patient is a smoker and has high cholesterol discussed risk factor reduction in detail with patient.  Will evaluate further if recurrent.

## 2024-05-01 NOTE — Assessment & Plan Note (Addendum)
 Chronic, not at goal .. HAD HELD zetia  10 mg daily... will start zetia .  LDL  goal < 55 Intolerant of statins... indicated give CAD/CVA history

## 2024-05-01 NOTE — Progress Notes (Signed)
 Patient ID: Tiffany Orozco, female    DOB: 10/07/1950, 73 y.o.   MRN: [id number]  This visit was conducted in person.  BP (!) 140/80   Pulse 87   Temp 99.7 F (37.6 C) (Temporal)   Ht 4' 10.5 (1.486 m)   Wt 175 lb 2 oz (79.4 kg)   SpO2 99%   BMI 35.98 kg/m    CC:  Chief Complaint  Patient presents with   Annual Exam    Part 2 MWV 04/18/24    Subjective:   [name] is a 73 y.o. female presenting on 05/01/2024 for Annual Exam (Part 2/MWV 04/18/24)  The patient presents for  complete physical and review of chronic health problems. She also has the following acute concerns today:  She has noted new tingling in right hand now for the last 3 weeks.. not one side versus the other. Constant. No weakness... some trouble opening jars though.  Also noted tingling in right face.. lasted 10 hours.  No headache, no blurred vision.  On Aggrenox   In last 2 weeks, she had acute diarrhea for 10 days.. now some better in last few day.  Feels like food was going straight thorough.  Now soft but not watery stool, 2 times a day.  No new foods, or meds. No sick contacts.  On pantoprazole .  Treated with amox and prednsione 04/11/2024  Had stopped zetia  given diarrhea.  The patient saw a LPN or RN for medicare wellness visit.  Scheduled for April 18, 2024  Prevention and wellness was reviewed in detail. Note reviewed and important notes copied below if needed.  Hypothyroidism well-controlled  Levothyroxine  75 mcg daily Lab Results  Component Value Date   TSH 2.14 04/23/2024    Current smoker  Hypertension: Today has been stressed out.. lots of harassing calls. On amlodipine  5 mg daily BP Readings from Last 3 Encounters:  05/01/24 (!) 140/80  04/11/24 (!) 140/71  02/07/24 130/64  Using medication without problems or lightheadedness:  She feels sthis makes her lightheaded Chest pain with exertion: Edema: Short of breath: Average home BPs: not checking Other  issues:  MDD chronic stable control on no medication.   Lab Results  Component Value Date   CHOL 181 04/23/2024   HDL 41.10 04/23/2024   LDLCALC 111 (H) 04/23/2024   LDLDIRECT 103.0 09/02/2016   TRIG 144.0 04/23/2024   CHOLHDL 4 04/23/2024       Patient Care Team: Avelina Greig BRAVO, MD as PCP - [facility]   Relevant past medical, surgical, family and social history reviewed and updated as indicated. Interim medical history since our last visit reviewed. Allergies and medications reviewed and updated. Outpatient Medications Prior to Visit  Medication Sig Dispense Refill   albuterol  (VENTOLIN  HFA) 108 (90 Base) MCG/ACT inhaler Inhale 2 puffs into the lungs every 6 (six) hours as needed for wheezing or shortness of breath. 18 g 2   Boswellia Serrata (BOSWELLIA PO) Take 1 capsule by mouth daily.     Cholecalciferol (VITAMIN D3) 125 MCG (5000 UT) TABS Take 5,000 Units by mouth daily.     dipyridamole -aspirin  (AGGRENOX ) 200-25 MG 12hr capsule TAKE 1 CAPSULE BY MOUTH TWICE A DAY 180 capsule 3   ezetimibe  (ZETIA ) 10 MG tablet Take 1 tablet (10 mg total) by mouth daily. 90 tablet 3   fluticasone  (FLONASE ) 50 MCG/ACT nasal spray SPRAY 2 SPRAYS INTO EACH NOSTRIL EVERY DAY 48 mL 0   levothyroxine  (SYNTHROID ) 75  MCG tablet TAKE 1 TABLET BY MOUTH EVERY DAY 90 tablet 3   pantoprazole  (PROTONIX ) 40 MG tablet Take 1 tablet (40 mg total) by mouth daily. 90 tablet 1   Turmeric (QC TUMERIC COMPLEX PO) Take by mouth.     amLODipine  (NORVASC ) 5 MG tablet Take 1 tablet (5 mg total) by mouth daily. (Patient not taking: Reported on 04/18/2024) 90 tablet 3   predniSONE  (STERAPRED UNI-PAK 21 TAB) 10 MG (21) TBPK tablet Take by mouth daily. Take 6 tabs by mouth daily  for 1 days, then 5 tabs for 1 days, then 4 tabs for 1 days, then 3 tabs for 1 days, 2 tabs for 1 days, then 1 tab by mouth daily for 1 days (Patient not taking: Reported on 04/18/2024) 21 tablet 0   No  facility-administered medications prior to visit.     Per HPI unless specifically indicated in ROS section below Review of Systems  Constitutional:  Negative for fatigue and fever.  HENT:  Negative for congestion.   Eyes:  Negative for pain.  Respiratory:  Negative for cough and shortness of breath.   Cardiovascular:  Negative for chest pain, palpitations and leg swelling.  Gastrointestinal:  Negative for abdominal pain.  Genitourinary:  Negative for dysuria and vaginal bleeding.  Musculoskeletal:  Negative for back pain.  Neurological:  Negative for syncope, light-headedness and headaches.  Psychiatric/Behavioral:  Negative for dysphoric mood.     Objective:  BP (!) 140/80   Pulse 87   Temp 99.7 F (37.6 C) (Temporal)   Ht 4' 10.5 (1.486 m)   Wt 175 lb 2 oz (79.4 kg)   SpO2 99%   BMI 35.98 kg/m   Wt Readings from Last 3 Encounters:  05/01/24 175 lb 2 oz (79.4 kg)  04/18/24 174 lb (78.9 kg)  02/07/24 177 lb 6 oz (80.5 kg)      Physical Exam Vitals and nursing note reviewed.  Constitutional:      General: She is not in acute distress.    Appearance: Normal appearance. She is well-developed. She is not ill-appearing or toxic-appearing.  HENT:     Head: Normocephalic.     Right Ear: Hearing, tympanic membrane, ear canal and external ear normal. Tympanic membrane is not erythematous, retracted or bulging.     Left Ear: Hearing, tympanic membrane, ear canal and external ear normal. Tympanic membrane is not erythematous, retracted or bulging.     Nose: Mucosal edema and rhinorrhea present.     Right Sinus: No maxillary sinus tenderness or frontal sinus tenderness.     Left Sinus: No maxillary sinus tenderness or frontal sinus tenderness.     Mouth/Throat:     Pharynx: Uvula midline.  Eyes:     General: Lids are normal. Lids are everted, no foreign bodies appreciated.     Conjunctiva/sclera: Conjunctivae normal.     Pupils: Pupils are equal, round, and reactive to light.   Neck:     Thyroid : No thyroid  mass or thyromegaly.     Vascular: No carotid bruit.     Trachea: Trachea normal.  Cardiovascular:     Rate and Rhythm: Normal rate and regular rhythm.     Pulses: Normal pulses.     Heart sounds: Normal heart sounds, S1 normal and S2 normal. No murmur heard.    No friction rub. No gallop.  Pulmonary:     Effort: Pulmonary effort is normal. No tachypnea or respiratory distress.     Breath sounds: Normal breath sounds. No  decreased breath sounds, wheezing, rhonchi or rales.  Abdominal:     General: Bowel sounds are normal. There is no distension or abdominal bruit.     Palpations: Abdomen is soft. There is no fluid wave or mass.     Tenderness: There is no abdominal tenderness. There is no guarding or rebound.     Hernia: No hernia is present.  Musculoskeletal:     Cervical back: Normal range of motion and neck supple.  Lymphadenopathy:     Cervical: No cervical adenopathy.  Skin:    General: Skin is warm and dry.     Findings: No rash.  Neurological:     Mental Status: She is alert.     Cranial Nerves: No cranial nerve deficit.     Sensory: No sensory deficit.  Psychiatric:        Mood and Affect: Mood is not anxious or depressed.        Speech: Speech normal.        Behavior: Behavior normal. Behavior is cooperative.        Judgment: Judgment normal.       Results for orders placed or performed in visit on 04/23/24  T3, free   Collection Time: 04/23/24  8:10 AM  Result Value Ref Range   T3, Free 3.3 2.3 - 4.2 pg/mL  T4, free   Collection Time: 04/23/24  8:10 AM  Result Value Ref Range   Free T4 1.23 0.60 - 1.60 ng/dL  TSH   Collection Time: 04/23/24  8:10 AM  Result Value Ref Range   TSH 2.14 0.35 - 5.50 uIU/mL  Vitamin B12   Collection Time: 04/23/24  8:10 AM  Result Value Ref Range   Vitamin B-12 1,380 (H) 211 - 911 pg/mL  Comprehensive metabolic panel   Collection Time: 04/23/24  8:10 AM  Result Value Ref Range   Sodium 134  (L) 135 - 145 mEq/L   Potassium 4.5 3.5 - 5.1 mEq/L   Chloride 97 96 - 112 mEq/L   CO2 29 19 - 32 mEq/L   Glucose, Bld 106 (H) 70 - 99 mg/dL   BUN 15 6 - 23 mg/dL   Creatinine, Ser 9.24 0.40 - 1.20 mg/dL   Total Bilirubin 0.4 0.2 - 1.2 mg/dL   Alkaline Phosphatase 84 39 - 117 U/L   AST 15 0 - 37 U/L   ALT 18 0 - 35 U/L   Total Protein 7.0 6.0 - 8.3 g/dL   Albumin 4.5 3.5 - 5.2 g/dL   GFR 22.17 >39.99 mL/min   Calcium 9.2 8.4 - 10.5 mg/dL  Lipid panel   Collection Time: 04/23/24  8:10 AM  Result Value Ref Range   Cholesterol 181 0 - 200 mg/dL   Triglycerides 855.9 0.0 - 149.0 mg/dL   HDL 58.89 >60.99 mg/dL   VLDL 71.1 0.0 - 59.9 mg/dL   LDL Cholesterol 888 (H) 0 - 99 mg/dL   Total CHOL/HDL Ratio 4    NonHDL 139.88   VITAMIN D  25 Hydroxy (Vit-D Deficiency, Fractures)   Collection Time: 04/23/24  8:10 AM  Result Value Ref Range   VITD 43.82 30.00 - 100.00 ng/mL    Assessment and Plan The patient's preventative maintenance and recommended screening tests for an annual wellness exam were reviewed in full today. Brought up to date unless services declined.  Counselled on the importance of diet, exercise, and its role in overall health and mortality. The patient's FH and SH was reviewed, including their home  life, tobacco status, and drug and alcohol status.   Vaccines: Due for flu shot, shingles vaccine and tetanus.  Completed pneumonia  Pap/DVE:  not indicated Mammo: February 21, 2023 normal, repeat in 1 year Bone Density: Osteopenia due for reevaluation in 2025 Colon:  09/2021 repeat  possible in 10 years. Smoking Status: Current smoker, lung cancer screening program:.  September 27, 2023 ETOH/ drug use: None/none  Hep C: Done      Routine general medical examination at a health care facility  Other specified hypothyroidism Assessment & Plan: Chronic,  Good control on levothyroxine  75 mcg daily.  No longer on [employer] Thyroid    Statin intolerance Assessment &  Plan: Side effects to statins in the past.   HYPERCHOLESTEROLEMIA Assessment & Plan: Chronic, not at goal .. HAD HELD zetia  10 mg daily... will start zetia .  LDL  goal < 55 Intolerant of statins... indicated give CAD/CVA history    Depression, major, single episode, moderate (HCC) Assessment & Plan: Chronic, well-controlled on no medication   Emphysema lung (HCC) Assessment & Plan: Noted incidentally on CT lung cancer screening.  She is a current smoker and has been encouraged to quit but is precontemplative.    We discussed possible treatment of emphysema COPD but she states she is minimally symptomatic without any shortness of breath and less walking long distances.  No daily cough, she reports infrequent infections.   Hyponatremia Assessment & Plan: Chronic, but stable. Limit water intake and increase salt intake.   Primary hypertension Assessment & Plan: Chronic  Continue to encourage smoking cessation and lifestyle changes.  Amlodipine  5 mg p.o. daily makes her dizzy.SABRA will try 1/2 tablet daily. Return and ER precautions provided.   Tobacco abuse Assessment & Plan: Encouraged cessation High blood pressure  Copd  Pt is not interested in quitting Encouraged her to keep thinking about it    Osteopenia, unspecified location -     DG Bone Density; Future  Acute diarrhea Assessment & Plan: Acute, appears to be resolving. Most likely secondary to recent antibiotics.  Can use probiotic as needed.  Follow-up if not improving as expected. Restart medications that were held during diarrhea.   Numbness and tingling in right hand Assessment & Plan: Acute, most focally right radial side of hand.  Most consistent with carpal tunnel.  Patient's neurologic exam otherwise normal. She will start wearing carpal tunnel brace and right hand at bedtime.  Of note she did mention tingling a few weeks ago in her right face.  Possible TIA given history of CVA but patient  continued on Aggrenox .  Patient is a smoker and has high cholesterol discussed risk factor reduction in detail with patient.  Will evaluate further if recurrent.   Other orders -     amLODIPine  Besylate; Take 0.5 tablets (2.5 mg total) by mouth daily.  Dispense: 90 tablet; Refill: 3      Return in about 1 year (around 05/01/2025) for phone AMW,  fasting labs then CPE with me.   Greig Ring, MD

## 2024-05-11 ENCOUNTER — Other Ambulatory Visit: Payer: Self-pay | Admitting: Family Medicine

## 2024-05-11 ENCOUNTER — Telehealth: Payer: Self-pay | Admitting: Family Medicine

## 2024-05-11 NOTE — Telephone Encounter (Unsigned)
 Copied from CRM #[id number]. Topic: Clinical - Prescription Issue >> May 11, 2024 11:15 AM Tiffini S wrote: Reason for CRM: Patient states that medications was denied for levothyroxine  (SYNTHROID ) 75 MCG tablet per the pharmacy- advised patient that prescription was sent today 05/11/24  Patient asked for a call back at [phone] to discuss as she cannot be without medication

## 2024-05-15 ENCOUNTER — Emergency Department

## 2024-05-15 ENCOUNTER — Other Ambulatory Visit: Payer: Self-pay

## 2024-05-15 ENCOUNTER — Observation Stay
Admission: EM | Admit: 2024-05-15 | Discharge: 2024-05-17 | Disposition: A | Attending: Emergency Medicine | Admitting: Emergency Medicine

## 2024-05-15 ENCOUNTER — Ambulatory Visit: Payer: Self-pay

## 2024-05-15 DIAGNOSIS — Z79899 Other long term (current) drug therapy: Secondary | ICD-10-CM | POA: Diagnosis not present

## 2024-05-15 DIAGNOSIS — I639 Cerebral infarction, unspecified: Secondary | ICD-10-CM | POA: Diagnosis present

## 2024-05-15 DIAGNOSIS — F17210 Nicotine dependence, cigarettes, uncomplicated: Secondary | ICD-10-CM | POA: Insufficient documentation

## 2024-05-15 DIAGNOSIS — E785 Hyperlipidemia, unspecified: Secondary | ICD-10-CM | POA: Insufficient documentation

## 2024-05-15 DIAGNOSIS — R201 Hypoesthesia of skin: Secondary | ICD-10-CM | POA: Insufficient documentation

## 2024-05-15 DIAGNOSIS — K219 Gastro-esophageal reflux disease without esophagitis: Secondary | ICD-10-CM | POA: Insufficient documentation

## 2024-05-15 DIAGNOSIS — R531 Weakness: Secondary | ICD-10-CM | POA: Diagnosis not present

## 2024-05-15 DIAGNOSIS — R051 Acute cough: Secondary | ICD-10-CM | POA: Insufficient documentation

## 2024-05-15 DIAGNOSIS — J449 Chronic obstructive pulmonary disease, unspecified: Secondary | ICD-10-CM | POA: Insufficient documentation

## 2024-05-15 DIAGNOSIS — R0602 Shortness of breath: Secondary | ICD-10-CM | POA: Insufficient documentation

## 2024-05-15 DIAGNOSIS — I6381 Other cerebral infarction due to occlusion or stenosis of small artery: Secondary | ICD-10-CM | POA: Diagnosis not present

## 2024-05-15 DIAGNOSIS — R350 Frequency of micturition: Secondary | ICD-10-CM | POA: Insufficient documentation

## 2024-05-15 DIAGNOSIS — R1030 Lower abdominal pain, unspecified: Secondary | ICD-10-CM | POA: Diagnosis not present

## 2024-05-15 DIAGNOSIS — I10 Essential (primary) hypertension: Secondary | ICD-10-CM | POA: Insufficient documentation

## 2024-05-15 DIAGNOSIS — R197 Diarrhea, unspecified: Secondary | ICD-10-CM | POA: Insufficient documentation

## 2024-05-15 DIAGNOSIS — I672 Cerebral atherosclerosis: Secondary | ICD-10-CM | POA: Insufficient documentation

## 2024-05-15 DIAGNOSIS — E039 Hypothyroidism, unspecified: Secondary | ICD-10-CM | POA: Insufficient documentation

## 2024-05-15 DIAGNOSIS — Z85828 Personal history of other malignant neoplasm of skin: Secondary | ICD-10-CM | POA: Insufficient documentation

## 2024-05-15 DIAGNOSIS — R29810 Facial weakness: Secondary | ICD-10-CM | POA: Diagnosis present

## 2024-05-15 DIAGNOSIS — Q670 Congenital facial asymmetry: Secondary | ICD-10-CM

## 2024-05-15 DIAGNOSIS — R208 Other disturbances of skin sensation: Secondary | ICD-10-CM

## 2024-05-15 LAB — DIFFERENTIAL
Abs Immature Granulocytes: 0.02 K/uL (ref 0.00–0.07)
Basophils Absolute: 0 K/uL (ref 0.0–0.1)
Basophils Relative: 1 %
Eosinophils Absolute: 0.1 K/uL (ref 0.0–0.5)
Eosinophils Relative: 1 %
Immature Granulocytes: 0 %
Lymphocytes Relative: 33 %
Lymphs Abs: 2 K/uL (ref 0.7–4.0)
Monocytes Absolute: 0.5 K/uL (ref 0.1–1.0)
Monocytes Relative: 9 %
Neutro Abs: 3.5 K/uL (ref 1.7–7.7)
Neutrophils Relative %: 56 %

## 2024-05-15 LAB — CBC
HCT: 45.5 % (ref 36.0–46.0)
Hemoglobin: 15.2 g/dL — ABNORMAL HIGH (ref 12.0–15.0)
MCH: 30.7 pg (ref 26.0–34.0)
MCHC: 33.4 g/dL (ref 30.0–36.0)
MCV: 91.9 fL (ref 80.0–100.0)
Platelets: 309 K/uL (ref 150–400)
RBC: 4.95 MIL/uL (ref 3.87–5.11)
RDW: 13.3 % (ref 11.5–15.5)
WBC: 6.1 K/uL (ref 4.0–10.5)
nRBC: 0 % (ref 0.0–0.2)

## 2024-05-15 LAB — RESP PANEL BY RT-PCR (RSV, FLU A&B, COVID)  RVPGX2
Influenza A by PCR: NEGATIVE
Influenza B by PCR: NEGATIVE
Resp Syncytial Virus by PCR: NEGATIVE
SARS Coronavirus 2 by RT PCR: NEGATIVE

## 2024-05-15 LAB — PROTIME-INR
INR: 0.9 (ref 0.8–1.2)
Prothrombin Time: 12.4 s (ref 11.4–15.2)

## 2024-05-15 LAB — ETHANOL: Alcohol, Ethyl (B): <15 mg/dL (ref <15)

## 2024-05-15 LAB — COMPREHENSIVE METABOLIC PANEL WITH GFR
ALT: 21 U/L (ref 0–44)
AST: 19 U/L (ref 15–41)
Albumin: 4.3 g/dL (ref 3.5–5.0)
Alkaline Phosphatase: 89 U/L (ref 38–126)
Anion gap: 12 (ref 5–15)
BUN: 13 mg/dL (ref 8–23)
CO2: 23 mmol/L (ref 22–32)
Calcium: 8.9 mg/dL (ref 8.9–10.3)
Chloride: 95 mmol/L — ABNORMAL LOW (ref 98–111)
Creatinine, Ser: 0.6 mg/dL (ref 0.44–1.00)
GFR, Estimated: >60 mL/min (ref >60)
Glucose, Bld: 108 mg/dL — ABNORMAL HIGH (ref 70–99)
Potassium: 3.7 mmol/L (ref 3.5–5.1)
Sodium: 130 mmol/L — ABNORMAL LOW (ref 135–145)
Total Bilirubin: 0.5 mg/dL (ref 0.0–1.2)
Total Protein: 7.2 g/dL (ref 6.5–8.1)

## 2024-05-15 LAB — HEMOGLOBIN A1C
Hgb A1c MFr Bld: 5.6 % (ref 4.8–5.6)
Mean Plasma Glucose: 114.02 mg/dL

## 2024-05-15 LAB — APTT: aPTT: 32 s (ref 24–36)

## 2024-05-15 LAB — CBG MONITORING, ED: Glucose-Capillary: 108 mg/dL — ABNORMAL HIGH (ref 70–99)

## 2024-05-15 MED ORDER — ACETAMINOPHEN 325 MG PO TABS: 650.0000 mg | ORAL_TABLET | ORAL | Status: DC | PRN

## 2024-05-15 MED ORDER — SODIUM CHLORIDE 0.9 % IV SOLN: INTRAVENOUS | Status: DC

## 2024-05-15 MED ORDER — EZETIMIBE 10 MG PO TABS
10.0000 mg | ORAL_TABLET | Freq: Every day | ORAL | Status: DC
  Administered 2024-05-17: 08:00:00 10 mg via ORAL
  Filled 2024-05-15 (×3): qty 1

## 2024-05-15 MED ORDER — ACETAMINOPHEN 160 MG/5ML PO SOLN: 650.0000 mg | ORAL | Status: DC | PRN

## 2024-05-15 MED ORDER — ACETAMINOPHEN 650 MG RE SUPP: 650.0000 mg | RECTAL | Status: DC | PRN

## 2024-05-15 MED ORDER — SENNOSIDES-DOCUSATE SODIUM 8.6-50 MG PO TABS: 1.0000 | ORAL_TABLET | Freq: Every evening | ORAL | Status: DC | PRN

## 2024-05-15 MED ORDER — ASPIRIN 300 MG RE SUPP: 300.0000 mg | Freq: Every day | RECTAL | Status: DC

## 2024-05-15 MED ORDER — STROKE: EARLY STAGES OF RECOVERY BOOK: Freq: Once | Status: AC

## 2024-05-15 MED ORDER — ENOXAPARIN SODIUM 40 MG/0.4ML IJ SOSY
40.0000 mg | PREFILLED_SYRINGE | INTRAMUSCULAR | Status: AC
  Administered 2024-05-15 – 2024-05-16 (×2): 40 mg via SUBCUTANEOUS
  Filled 2024-05-15 (×2): qty 0.4

## 2024-05-15 MED ORDER — FLUTICASONE PROPIONATE 50 MCG/ACT NA SUSP
1.0000 | Freq: Every day | NASAL | Status: DC
  Administered 2024-05-17: 08:00:00 1 via NASAL
  Filled 2024-05-15: qty 16

## 2024-05-15 MED ORDER — SODIUM CHLORIDE 0.9% FLUSH: 3.0000 mL | Freq: Once | INTRAVENOUS | Status: AC

## 2024-05-15 MED ORDER — PANTOPRAZOLE SODIUM 40 MG PO TBEC
40.0000 mg | DELAYED_RELEASE_TABLET | Freq: Every day | ORAL | Status: DC
  Administered 2024-05-16: 13:00:00 40 mg via ORAL
  Filled 2024-05-15 (×2): qty 1

## 2024-05-15 MED ORDER — VITAMIN D 25 MCG (1000 UNIT) PO TABS
5000.0000 [IU] | ORAL_TABLET | Freq: Every day | ORAL | Status: DC
  Administered 2024-05-16 – 2024-05-17 (×2): 5000 [IU] via ORAL
  Filled 2024-05-15 (×2): qty 5

## 2024-05-15 MED ORDER — LEVOTHYROXINE SODIUM 50 MCG PO TABS
75.0000 ug | ORAL_TABLET | Freq: Every day | ORAL | Status: DC
  Administered 2024-05-17: 06:00:00 75 ug via ORAL
  Filled 2024-05-15: qty 1

## 2024-05-15 MED ORDER — IPRATROPIUM-ALBUTEROL 0.5-2.5 (3) MG/3ML IN SOLN
3.0000 mL | Freq: Once | RESPIRATORY_TRACT | Status: AC
  Administered 2024-05-15: 13:00:00 3 mL via RESPIRATORY_TRACT
  Filled 2024-05-15: qty 3

## 2024-05-15 MED ORDER — ALBUTEROL SULFATE (2.5 MG/3ML) 0.083% IN NEBU
2.5000 mg | INHALATION_SOLUTION | Freq: Four times a day (QID) | RESPIRATORY_TRACT | Status: DC | PRN
  Administered 2024-05-15 – 2024-05-17 (×2): 2.5 mg via RESPIRATORY_TRACT
  Filled 2024-05-15 (×2): qty 3

## 2024-05-15 MED ORDER — IOHEXOL 300 MG/ML  SOLN
100.0000 mL | Freq: Once | INTRAMUSCULAR | Status: AC | PRN
  Administered 2024-05-15: 15:00:00 100 mL via INTRAVENOUS

## 2024-05-15 MED ORDER — ASPIRIN 325 MG PO TABS: 325.0000 mg | ORAL_TABLET | Freq: Every day | ORAL | Status: DC

## 2024-05-15 MED ORDER — AMLODIPINE BESYLATE 5 MG PO TABS
2.5000 mg | ORAL_TABLET | Freq: Every day | ORAL | Status: DC
  Filled 2024-05-15 (×2): qty 1

## 2024-05-15 NOTE — ED Provider Notes (Signed)
 [facility] Provider Note    Event Date/Time   First MD Initiated Contact with Patient 05/15/24 1235     (approximate)   History   Facial Droop and Drooling   HPI  Tiffany Orozco is a 73 y.o. female with history of COPD, IBS, CVA, presenting with left-sided weakness and drooling.  States that she was completely normal on Thursday.  Started having decreased sensation and weakness to her left part of her body with drooling to her left lower face.  States symptoms has been constant.  States that she has prior history of a stroke, has some residual speech difficulties from that stroke that is not new, states that she had right-sided weakness that has improved.  Denies any trauma or falls.  States that she has noted a cough since being in the emergency department, no chest pain, did think she was wheezing.  Also states that she has been having diarrhea but no nausea or vomiting.  States that she also noted abdominal pain since she has been in the emergency department, mostly lower.  Had told triage that she was having urinary frequency and urgency recently.   On independent chart review, she has had history of stroke, was seen by primary care in early December, has history of hyperlipidemia, started on Zetia , has history of statin intolerance in the past, history of chronic hypothyroidism, history of hyponatremia, she was instructed to limit water intake and increase her salt intake.  Also history of hypertension on amlodipine .     Physical Exam   Triage Vital Signs: ED Triage Vitals  Encounter Vitals Group     BP 05/15/24 1129 (!) 170/68     Girls Systolic BP Percentile --      Girls Diastolic BP Percentile --      Boys Systolic BP Percentile --      Boys Diastolic BP Percentile --      Pulse Rate 05/15/24 1129 88     Resp 05/15/24 1129 16     Temp 05/15/24 1129 97.8 F (36.6 C)     Temp Source 05/15/24 1129 Oral     SpO2 05/15/24 1129 97 %     Weight  05/15/24 1130 175 lb 0.7 oz (79.4 kg)     Height --      Head Circumference --      Peak Flow --      Pain Score 05/15/24 1130 6     Pain Loc --      Pain Education --      Exclude from Growth Chart --     Most recent vital signs: Vitals:   05/15/24 1129  BP: (!) 170/68  Pulse: 88  Resp: 16  Temp: 97.8 F (36.6 C)  SpO2: 97%     General: Awake, no distress.  CV:  Good peripheral perfusion.  Resp:  Normal effort.  No tachypnea or respiratory distress, sparse wheezing Abd:  No distention.  Soft, mildly tender to the lower quadrants, no guarding Other:  She has left lower nasolabial flattening, pupils are equal and reactive, extraocular movements are intact, speech is a little slow but no obvious dysarthria, she has noticeable weakness to her left upper and lower extremity with decreased sensation to her left face and upper right lower extremity.   ED Results / Procedures / Treatments   Labs (all labs ordered are listed, but only abnormal results are displayed) Labs Reviewed  CBC - Abnormal; Notable for the following  components:      Result Value   Hemoglobin 15.2 (*)    All other components within normal limits  COMPREHENSIVE METABOLIC PANEL WITH GFR - Abnormal; Notable for the following components:   Sodium 130 (*)    Chloride 95 (*)    Glucose, Bld 108 (*)    All other components within normal limits  CBG MONITORING, ED - Abnormal; Notable for the following components:   Glucose-Capillary 108 (*)    All other components within normal limits  RESP PANEL BY RT-PCR (RSV, FLU A&B, COVID)  RVPGX2  PROTIME-INR  APTT  DIFFERENTIAL  ETHANOL  URINALYSIS, W/ REFLEX TO CULTURE (INFECTION SUSPECTED)  HEMOGLOBIN A1C     EKG  EKG shows, sinus rhythm, rate 81, normal QRS, normal QTc, no obvious ischemic ST elevation, T wave flattening to aVL, 3, not significantly changed compared to prior   RADIOLOGY On my independent interpretation, CT head without obvious intracranial  hemorrhage   PROCEDURES:  Critical Care performed: No  Procedures   MEDICATIONS ORDERED IN ED: Medications  sodium chloride  flush (NS) 0.9 % injection 3 mL ( Intravenous Canceled Entry 05/15/24 1132)  amLODipine  (NORVASC ) tablet 2.5 mg (has no administration in time range)  ezetimibe  (ZETIA ) tablet 10 mg (has no administration in time range)  levothyroxine  (SYNTHROID ) tablet 75 mcg (has no administration in time range)  pantoprazole  (PROTONIX ) EC tablet 40 mg (has no administration in time range)  Vitamin D3 TABS 5,000 Units (has no administration in time range)  albuterol  (VENTOLIN  HFA) 108 (90 Base) MCG/ACT inhaler 2 puff (has no administration in time range)  fluticasone  (FLONASE ) 50 MCG/ACT nasal spray 1 spray (has no administration in time range)   stroke: early stages of recovery book (has no administration in time range)  0.9 %  sodium chloride  infusion (has no administration in time range)  acetaminophen  (TYLENOL ) tablet 650 mg (has no administration in time range)    Or  acetaminophen  (TYLENOL ) 160 MG/5ML solution 650 mg (has no administration in time range)    Or  acetaminophen  (TYLENOL ) suppository 650 mg (has no administration in time range)  senna-docusate (Senokot-S) tablet 1 tablet (has no administration in time range)  enoxaparin  (LOVENOX ) injection 40 mg (has no administration in time range)  aspirin  suppository 300 mg (has no administration in time range)    Or  aspirin  tablet 325 mg (has no administration in time range)  ipratropium-albuterol  (DUONEB) 0.5-2.5 (3) MG/3ML nebulizer solution 3 mL (3 mLs Nebulization Given 05/15/24 1319)  iohexol  (OMNIPAQUE ) 300 MG/ML solution 100 mL (100 mLs Intravenous Contrast Given 05/15/24 1447)     IMPRESSION / MDM / ASSESSMENT AND PLAN / ED COURSE  I reviewed the triage vital signs and the nursing notes.                              Differential diagnosis includes, but is not limited to, CVA, mass, TIA, electrolyte  derangements, UTI, dehydration, IBS, colitis, diverticulitis.  Did consider COPD exacerbation but patient's wheezing is sparse, she is not tachypneic, no retractions or increased work of breathing.  Will give her DuoNeb here.  Labs, EKG, UA, CT head, CT abdomen pelvis, chest x-ray, viral swab, she will need to be admitted for further management.  Will need an MRI.  Patient's presentation is most consistent with acute presentation with potential threat to life or bodily function.  Independent interpretation of labs and imaging below.  MRI shows a  right basal ganglia stroke.  Given her overall symptoms, she will need to be admitted for further management.  Consult to hospitalist will admit the patient.  She is admitted.  The patient is on the cardiac monitor to evaluate for evidence of arrhythmia and/or significant heart rate changes.   Clinical Course as of 05/15/24 1511  Tue May 15, 2024  1240 Independent review of labs, mild hyponatremia, rest of electrolytes not severely deranged, LFTs are normal, no leukocytosis, ethanol levels are not elevated [TT]  1302 CT HEAD WO CONTRAST IMPRESSION: 1. No acute intracranial abnormality. 2. Patchy supratentorial white matter hypodensity, likely sequela of mild chronic small-vessel ischemic change. 3. Small remote lacunar infarcts in right external capsule and left basal ganglia.   [TT]  1328 DG Chest 1 View No active disease.  [TT]  1501 MR BRAIN WO CONTRAST IMPRESSION: 1. Acute to early subacute right basal ganglia infarct. 2. Mild to moderate chronic small vessel ischemic disease.   [TT]    Clinical Course User Index [TT] Waymond Lorelle Cummins, MD     FINAL CLINICAL IMPRESSION(S) / ED DIAGNOSES   Final diagnoses:  Weakness  Decreased sensation  Facial asymmetry  Acute cough  Shortness of breath  Lower abdominal pain  Diarrhea, unspecified type  Urinary frequency  Cerebrovascular accident (CVA), unspecified mechanism (HCC)     Rx / DC  Orders   ED Discharge Orders     None        Note:  This document was prepared using Dragon voice recognition software and may include unintentional dictation errors.    Waymond Lorelle Cummins, MD 05/15/24 [phone]

## 2024-05-15 NOTE — ED Notes (Signed)
 Brandy RN called at this time to attempt IV.

## 2024-05-15 NOTE — ED Notes (Signed)
Attempted IV access x2 with no success.

## 2024-05-15 NOTE — ED Triage Notes (Signed)
 C/O drooping on left side and drooling more for a couple of days.  Symptoms had resolved then returned today, noticed symptoms at 0430 this morning.  States woke up this way.  Went to bed last night at 2330. Hx CVA in 2013. Also c/o urinary frequency and urgency x 1 day. Diarrhea intermittently x several weeks.  Also nausea x 1 day.  AAOx3. Skin warm and dry. MAE equally and strong.  C/o right hand feeling cold intermittently.

## 2024-05-15 NOTE — H&P (Signed)
 History and Physical    Tiffany Orozco FMW:[id number] DOB: 1951/02/26 DOA: 05/15/2024  PCP: Avelina Greig BRAVO, MD  Patient coming from: Home  I have personally briefly reviewed patient's old medical records in [facility]  Chief Complaint: Facial droop.  Slurred speech  [name] is a 73 y.o. female with medical history significant of previous CVA maintained on Aggrenox , hypertension, hyperlipidemia, hypothyroidism, GERD who presents to the ED with approximately a 4 to 5-day history of left-sided facial droop associated with slightly deep left-sided grip strength and numbness on the right side of the face.  Family member also noted to be drooling.  Patient endorses compliance with her home Aggrenox .  She was last seen by Dr. Rosemarie stroke neurology in [place] in 2016 and has been on Aggrenox  since that time.  On my evaluation the patient is resting in bed.  She appears uncomfortable.  She says her main complaint is difficulty breathing.  Her lungs are clear and she remains on room air.  All other vital signs stable.  MRI completed by EDP demonstrates acute to subacute right basal ganglia infarct    ED Course: Patient had numerous complaints which was worked up.  MRI revealed findings as above.  Hospitalist contacted for admission.  Review of Systems: As per HPI otherwise 14 point review of systems negative.    Past Medical History:  Diagnosis Date   Allergy    Anxiety    COPD (chronic obstructive pulmonary disease) (HCC)    DDD (degenerative disc disease), lumbar    mild   Depression    Female rectocele without uterine prolapse    GERD (gastroesophageal reflux disease)    Hearing loss    Heart murmur    Hyperlipidemia    Hypertension    Hypothyroidism    IBS (irritable bowel syndrome)    Influenza A 06/2015   Intractable nausea and vomiting 03/01/2022   Obesity    Osteopenia    Plantar fasciitis    Pneumonia    Skin cancer of arm    Skin cancer of  face    Stroke (HCC) 07/2010   no permenant deficits   Thrombocytopenia    Tubal pregnancy     Past Surgical History:  Procedure Laterality Date   APPENDECTOMY     CHOLECYSTECTOMY  2004   COLONOSCOPY     COLONOSCOPY WITH PROPOFOL  N/A 10/21/2022   Procedure: COLONOSCOPY WITH PROPOFOL ;  Surgeon: Onita Elspeth Sharper, DO;  Location: [facility] ENDOSCOPY;  Service: Gastroenterology;  Laterality: N/A;   CYST EXCISION Right    breast mole   EMGs  11/2002   atms and wrists neg   ESOPHAGEAL MANOMETRY N/A 03/17/2015   Procedure: ESOPHAGEAL MANOMETRY (EM);  Surgeon: Elspeth Deward Naval, MD;  Location: [facility] ENDOSCOPY;  Service: Gastroenterology;  Laterality: N/A;   ESOPHAGOGASTRODUODENOSCOPY N/A 03/02/2022   Procedure: ESOPHAGOGASTRODUODENOSCOPY (EGD);  Surgeon: Onita Elspeth Sharper, DO;  Location: [facility] ENDOSCOPY;  Service: Gastroenterology;  Laterality: N/A;   ESOPHAGOGASTRODUODENOSCOPY (EGD) WITH PROPOFOL  N/A 05/06/2022   Procedure: ESOPHAGOGASTRODUODENOSCOPY (EGD) WITH PROPOFOL ;  Surgeon: Onita Elspeth Sharper, DO;  Location: [facility] ENDOSCOPY;  Service: Gastroenterology;  Laterality: N/A;   FOOT SURGERY Right 2008, 2009, 2010   torn ligament, hammertoes   HERNIA REPAIR     TUBAL LIGATION  1975   UPPER GI ENDOSCOPY     XI ROBOTIC ASSISTED VENTRAL HERNIA N/A 04/14/2020   Procedure: XI ROBOTIC ASSISTED VENTRAL HERNIA;  Surgeon: Marolyn Nest, MD;  Location: [facility] ORS;  Service:  General;  Laterality: N/A;     reports that she has been smoking cigarettes. She has a 23.3 pack-year smoking history. She has never used smokeless tobacco. She reports that she does not drink alcohol and does not use drugs.  Allergies[1]  Family History  Problem Relation Age of Onset   Skin cancer Father    Hypertension Mother    Lung cancer Brother    Colon cancer Sister    Esophageal cancer Sister    Ovarian cancer Sister    Stomach cancer Neg Hx    Pancreatic cancer Neg Hx      Prior to Admission  medications  Medication Sig Start Date End Date Taking? Authorizing Provider  albuterol  (VENTOLIN  HFA) 108 (90 Base) MCG/ACT inhaler Inhale 2 puffs into the lungs every 6 (six) hours as needed for wheezing or shortness of breath. 10/28/23   Bedsole, Amy E, MD  amLODipine  (NORVASC ) 5 MG tablet Take 0.5 tablets (2.5 mg total) by mouth daily. 05/01/24   Bedsole, Greig BRAVO, MD  Boswellia Serrata (BOSWELLIA PO) Take 1 capsule by mouth daily.    [provider]  Cholecalciferol  (VITAMIN D3) 125 MCG (5000 UT) TABS Take 5,000 Units by mouth daily.    [provider]  dipyridamole -aspirin  (AGGRENOX ) 200-25 MG 12hr capsule TAKE 1 CAPSULE BY MOUTH TWICE A DAY 05/19/23   Bedsole, Amy E, MD  ezetimibe  (ZETIA ) 10 MG tablet Take 1 tablet (10 mg total) by mouth daily. 04/21/23   Bedsole, Amy E, MD  fluticasone  (FLONASE ) 50 MCG/ACT nasal spray SPRAY 2 SPRAYS INTO EACH NOSTRIL EVERY DAY 03/15/24   Bedsole, Amy E, MD  levothyroxine  (SYNTHROID ) 75 MCG tablet TAKE 1 TABLET BY MOUTH EVERY DAY 05/11/24   Bedsole, Amy E, MD  pantoprazole  (PROTONIX ) 40 MG tablet Take 1 tablet (40 mg total) by mouth daily. 02/07/24   Avelina Greig BRAVO, MD  Turmeric (QC TUMERIC COMPLEX PO) Take by mouth.    [provider]    Physical Exam: Vitals:   05/15/24 1129 05/15/24 1130  BP: (!) 170/68   Pulse: 88   Resp: 16   Temp: 97.8 F (36.6 C)   TempSrc: Oral   SpO2: 97%   Weight:  79.4 kg   General: Fatigue otherwise stable HEENT: Normocephalic, atraumatic Neck, supple, trachea midline, no tenderness Heart: Regular rate and rhythm, S1/S2 normal, no murmurs Lungs: Clear to auscultation bilaterally, no adventitious sounds, normal work of breathing Abdomen: Soft, nontender, nondistended, positive bowel sounds Extremities: Normal, atraumatic, no clubbing or cyanosis, normal muscle tone Skin: No rashes or lesions, normal color Neurologic: Alert and oriented x 3, left-sided facial droop Psychiatric: Normal  affect   Labs on Admission: I have personally reviewed following labs and imaging studies  CBC: Recent Labs  Lab 05/15/24 1136  WBC 6.1  NEUTROABS 3.5  HGB 15.2*  HCT 45.5  MCV 91.9  PLT 309   Basic Metabolic Panel: Recent Labs  Lab 05/15/24 1136  NA 130*  K 3.7  CL 95*  CO2 23  GLUCOSE 108*  BUN 13  CREATININE 0.60  CALCIUM 8.9   GFR: Estimated Creatinine Clearance: 54.7 mL/min (by C-G formula based on SCr of 0.6 mg/dL). Liver Function Tests: Recent Labs  Lab 05/15/24 1136  AST 19  ALT 21  ALKPHOS 89  BILITOT 0.5  PROT 7.2  ALBUMIN 4.3   No results for input(s): LIPASE, AMYLASE in the last 168 hours. No results for input(s): AMMONIA in the last 168 hours. Coagulation Profile: Recent  Labs  Lab 05/15/24 1136  INR 0.9   Cardiac Enzymes: No results for input(s): CKTOTAL, CKMB, CKMBINDEX, TROPONINI in the last 168 hours. BNP (last 3 results) No results for input(s): PROBNP in the last 8760 hours. HbA1C: No results for input(s): HGBA1C in the last 72 hours. CBG: Recent Labs  Lab 05/15/24 1129  GLUCAP 108*   Lipid Profile: No results for input(s): CHOL, HDL, LDLCALC, TRIG, CHOLHDL, LDLDIRECT in the last 72 hours. Thyroid  Function Tests: No results for input(s): TSH, T4TOTAL, FREET4, T3FREE, THYROIDAB in the last 72 hours. Anemia Panel: No results for input(s): VITAMINB12, FOLATE, FERRITIN, TIBC, IRON, RETICCTPCT in the last 72 hours. Urine analysis:    Component Value Date/Time   COLORURINE YELLOW (A) 02/28/2022 1857   APPEARANCEUR HAZY (A) 02/28/2022 1857   LABSPEC 1.011 02/28/2022 1857   PHURINE 6.0 02/28/2022 1857   GLUCOSEU NEGATIVE 02/28/2022 1857   HGBUR MODERATE (A) 02/28/2022 1857   BILIRUBINUR Negative 07/23/2022 1104   KETONESUR 20 (A) 02/28/2022 1857   PROTEINUR Positive (A) 07/23/2022 1104   PROTEINUR 100 (A) 02/28/2022 1857   UROBILINOGEN 0.2 07/23/2022 1104   UROBILINOGEN 0.2  01/25/2015 1903   NITRITE Negative 07/23/2022 1104   NITRITE NEGATIVE 02/28/2022 1857   LEUKOCYTESUR Negative 07/23/2022 1104   LEUKOCYTESUR NEGATIVE 02/28/2022 1857    Radiological Exams on Admission: MR BRAIN WO CONTRAST Result Date: 05/15/2024 EXAM: MRI BRAIN WITHOUT CONTRAST 05/15/2024 02:27:33 PM TECHNIQUE: Multiplanar multisequence MRI of the head/brain was performed without the administration of intravenous contrast. COMPARISON: Head CT 05/15/2024 and MRI 01/25/2015. CLINICAL HISTORY: Neuro deficit, acute, stroke suspected. Left facial droop. FINDINGS: BRAIN AND VENTRICLES: There is an acute to early subacute infarct involving the posterior right basal ganglia and corona radiata. Patchy T2 hyperintensities elsewhere in the cerebral white matter bilaterally have greatly progressed from the remote MRI and are nonspecific but compatible with mild to moderate chronic small vessel ischemic disease. Chronic lacunar infarcts are noted in the thalami and left basal ganglia. No intracranial hemorrhage, mass, midline shift, hydrocephalus, or extra-axial fluid collection is evident. Mild cerebral atrophy is within normal limits for age. Major intracranial vascular flow voids are preserved. ORBITS: No acute abnormality. SINUSES AND MASTOIDS: Small bilateral mastoid effusions. Clear paranasal sinuses. BONES AND SOFT TISSUES: Normal marrow signal. No acute soft tissue abnormality. IMPRESSION: 1. Acute to early subacute right basal ganglia infarct. 2. Mild to moderate chronic small vessel ischemic disease. Electronically signed by: Dasie Hamburg MD 05/15/2024 02:58 PM EST RP Workstation: [id number]   DG Chest 1 View Result Date: 05/15/2024 CLINICAL DATA:  Shortness of breath, cough EXAM: CHEST  1 VIEW COMPARISON:  July 23, 2022 FINDINGS: The heart size and mediastinal contours are within normal limits. Both lungs are clear. The visualized skeletal structures are unremarkable. IMPRESSION: No active disease.  Electronically Signed   By: Lynwood Landy Raddle M.D.   On: 05/15/2024 13:25   CT HEAD WO CONTRAST Result Date: 05/15/2024 EXAM: CT HEAD WITHOUT CONTRAST 05/15/2024 12:30:01 PM TECHNIQUE: CT of the head was performed without the administration of intravenous contrast. Automated exposure control, iterative reconstruction, and/or weight based adjustment of the mA/kV was utilized to reduce the radiation dose to as low as reasonably achievable. COMPARISON: CT of the head dated 01/08/2021. CLINICAL HISTORY: Neuro deficit, acute, stroke suspected. FINDINGS: BRAIN AND VENTRICLES: No acute hemorrhage. No evidence of acute infarct. Patchy supratentorial white matter hypodensity, likely sequela of mild chronic small-vessel ischemic change. Small remote lacunar infarcts in right external capsule and left  basal ganglia. No hydrocephalus. No extra-axial collection. No mass effect or midline shift. ORBITS: No acute abnormality. SINUSES: No acute abnormality. SOFT TISSUES AND SKULL: No acute soft tissue abnormality. No skull fracture. VASCULATURE: Bilateral carotid siphon calcification. IMPRESSION: 1. No acute intracranial abnormality. 2. Patchy supratentorial white matter hypodensity, likely sequela of mild chronic small-vessel ischemic change. 3. Small remote lacunar infarcts in right external capsule and left basal ganglia. Electronically signed by: Evalene Coho MD 05/15/2024 12:55 PM EST RP Workstation: [id number]    EKG: Independently reviewed.  Sinus rhythm  Assessment/Plan Principal Problem:   Acute CVA (cerebrovascular accident) (HCC)  Acute CVA Imaging consistent with acute to subacute right basal ganglia infarct Plan: Place in observation Neurology consult Resume home Aggrenox  for now Out of window for permissive hypertension.  Resume home amlodipine  PT OT speech Heart diet if passes RN swallow screen Lipid panel in a.m. Hemoglobin A1c in a.m.  Essential hypertension Resume home  amlodipine   Hyperlipidemia Resume home Zetia  And lipid panel  Hypothyroidism Resume Synthroid   GERD PPI    DVT prophylaxis: Lovenox  Code Status: Full Family Communication: Family member at bedside 12/16 Disposition Plan: Return to home environment Consults called: Neurology-Stack Admission status: Observation, telemetry   Calvin KATHEE Robson MD [facility]   If 7PM-7AM, please contact night-coverage   05/15/2024, 3:11 PM       [1]  Allergies Allergen Reactions   Lisinopril  Shortness Of Breath   Niacin Other (See Comments)     dizziness   Welchol  [Colesevelam  Hcl] Diarrhea, Nausea Only, Swelling and Other (See Comments)     swelling in face   Augmentin  [Amoxicillin -Pot Clavulanate] Nausea Only    GI upset with taking.   Oxycodone Hcl     Patient states this medication makes her hyper

## 2024-05-15 NOTE — Plan of Care (Signed)
°  Problem: Education: Goal: Knowledge of disease or condition will improve Outcome: Progressing   Problem: Pain Managment: Goal: General experience of comfort will improve and/or be controlled Outcome: Progressing   Problem: Safety: Goal: Ability to remain free from injury will improve Outcome: Progressing   Problem: Skin Integrity: Goal: Risk for impaired skin integrity will decrease Outcome: Progressing

## 2024-05-15 NOTE — Telephone Encounter (Signed)
Noted  Agree with urgent eval

## 2024-05-15 NOTE — Telephone Encounter (Signed)
 FYI Only or Action Required?: FYI only for provider: ED advised. Declines ED, advised UC at minimum, pt agreeable to go.  Patient was last seen in primary care on 05/01/2024 by Avelina Greig BRAVO, MD.  Called Nurse Triage reporting Shortness of Breath.  Symptoms began several days ago.  Interventions attempted: Prescription medications: Albuterol  inhaler.  Symptoms are: gradually worsening.  Triage Disposition: Go to ED Now (Notify PCP)  Patient/caregiver understands and will follow disposition?: No, wishes to speak with PCP     Copied from CRM [id number]. Topic: Clinical - Red Word Triage >> May 15, 2024  9:16 AM Berneda FALCON wrote: Red Word that prompted transfer to Nurse Triage: Patient states she has been wheezing a lot and often feels like she is not getting enough oxygen. She states this has been ongoing since Sunday night. Also states she is having urinary frequency/urgency but no pain. She just noticed the urgency as of yesterday. Patient states she also has diarrhea (for a while, no timeframe). Reason for Disposition  [1] MODERATE difficulty breathing (e.g., speaks in phrases, SOB even at rest, pulse 100-120) AND [2] NEW-onset or WORSE than normal  Answer Assessment - Initial Assessment Questions Onset of wheezing with moderate SOB on Saturday. Constant wheezing ranging from moderate to severe. Feels like she can't get enough air to breathe. Audible moderate wheezing  and moderate SOB noted over the phone. Waking her up at night.  Hx of emphysema, does not wear O2.  Tightness in center of chest. Using an albuterol  inhaler about 1-2x/day which helps. Also report diarrhea dialy for past few months and urinary frequency/urgency with foul smelling urine since Friday. Advised ED, pt declines. States she thinks if she can be seen in office and get a neb tx she will be fine. Callec CAL and spoke with Randall to notify of ED refusal, pt unable to be worked in today. This RN advised pt go to UC today  ASAP at minimum, who may tell pt to go to ED. Pt agreeable to go and has someone that can take her. Advised 911 for worsening symptoms.  1. RESPIRATORY STATUS: Describe your breathing? (e.g., wheezing, shortness of breath, unable to speak, severe coughing)      Feels like she can't get enough air into her lungs. Fluctuating moderate to severe wheezing  2. ONSET: When did this breathing problem begin?      Saturday  3. PATTERN Does the difficult breathing come and go, or has it been constant since it started?      Comes and goes  4. SEVERITY: How bad is your breathing? (e.g., mild, moderate, severe)      Moderate  5. RECURRENT SYMPTOM: Have you had difficulty breathing before? If Yes, ask: When was the last time? and What happened that time?      Yes. Last time had an URI.  6. CARDIAC HISTORY: Do you have any history of heart disease? (e.g., heart attack, angina, bypass surgery, angioplasty)      CVA, htn  7. LUNG HISTORY: Do you have any history of lung disease?  (e.g., pulmonary embolus, asthma, emphysema)     Emphysema  8. CAUSE: What do you think is causing the breathing problem?      Unsure  9. OTHER SYMPTOMS: Do you have any other symptoms? (e.g., chest pain, cough, dizziness, fever, runny nose)     Diarrhea daily for past few months. Chest tightness center. Some nausea last night. Denies cough. Sinus drainage for a few days.  Foul smelling urine with frequency and urgency. Denies blood in urine or fever.  10. O2 SATURATION MONITOR:  Do you use an oxygen saturation monitor (pulse oximeter) at home? If Yes, ask: What is your reading (oxygen level) today? What is your usual oxygen saturation reading? (e.g., 95%)       Denies  Protocols used: Breathing Difficulty-A-AH

## 2024-05-16 ENCOUNTER — Observation Stay: Admit: 2024-05-16

## 2024-05-16 ENCOUNTER — Observation Stay

## 2024-05-16 ENCOUNTER — Observation Stay
Admit: 2024-05-16 | Discharge: 2024-05-16 | Disposition: A | Discharge status: Home / Self Care | Attending: Internal Medicine | Admitting: Internal Medicine

## 2024-05-16 DIAGNOSIS — I639 Cerebral infarction, unspecified: Secondary | ICD-10-CM | POA: Diagnosis not present

## 2024-05-16 DIAGNOSIS — I6381 Other cerebral infarction due to occlusion or stenosis of small artery: Secondary | ICD-10-CM | POA: Diagnosis not present

## 2024-05-16 LAB — LIPID PANEL
Cholesterol: 128 mg/dL (ref 0–200)
HDL: 37 mg/dL — ABNORMAL LOW (ref >40)
LDL Cholesterol: 69 mg/dL (ref 0–99)
Total CHOL/HDL Ratio: 3.5 ratio
Triglycerides: 111 mg/dL (ref <150)
VLDL: 22 mg/dL (ref 0–40)

## 2024-05-16 LAB — ECHOCARDIOGRAM COMPLETE
AR max vel: 1.73 cm2
AV Area VTI: 1.81 cm2
AV Area mean vel: 1.73 cm2
AV Mean grad: 10 mmHg
AV Peak grad: 19.6 mmHg
Ao pk vel: 2.21 m/s
Area-P 1/2: 3.12 cm2
Calc EF: 72 %
Height: 58 in
MV VTI: 3.06 cm2
S' Lateral: 1.8 cm
Single Plane A2C EF: 58.5 %
Single Plane A4C EF: 80.1 %
Weight: 2800.72 [oz_av]

## 2024-05-16 LAB — GLUCOSE, CAPILLARY: Glucose-Capillary: 116 mg/dL — ABNORMAL HIGH (ref 70–99)

## 2024-05-16 MED ORDER — IOHEXOL 350 MG/ML SOLN
75.0000 mL | Freq: Once | INTRAVENOUS | Status: AC | PRN
  Administered 2024-05-16: 17:00:00 75 mL via INTRAVENOUS

## 2024-05-16 MED ORDER — ONDANSETRON HCL 4 MG/2ML IJ SOLN
4.0000 mg | Freq: Four times a day (QID) | INTRAMUSCULAR | Status: DC | PRN
  Administered 2024-05-16 (×2): 4 mg via INTRAVENOUS
  Filled 2024-05-16 (×2): qty 2

## 2024-05-16 MED ORDER — ASPIRIN 81 MG PO TBEC
81.0000 mg | DELAYED_RELEASE_TABLET | Freq: Every day | ORAL | Status: DC
  Administered 2024-05-16 – 2024-05-17 (×2): 81 mg via ORAL
  Filled 2024-05-16 (×2): qty 1

## 2024-05-16 MED ORDER — CLOPIDOGREL BISULFATE 75 MG PO TABS
75.0000 mg | ORAL_TABLET | Freq: Every day | ORAL | Status: DC
  Administered 2024-05-16 – 2024-05-17 (×2): 75 mg via ORAL
  Filled 2024-05-16 (×2): qty 1

## 2024-05-16 MED ORDER — LOPERAMIDE HCL 2 MG PO CAPS
2.0000 mg | ORAL_CAPSULE | ORAL | Status: DC | PRN
  Administered 2024-05-16: 17:00:00 2 mg via ORAL
  Filled 2024-05-16: qty 1

## 2024-05-16 MED ORDER — ONDANSETRON HCL 4 MG/2ML IJ SOLN: 4.0000 mg | Freq: Four times a day (QID) | INTRAMUSCULAR | Status: DC

## 2024-05-16 MED ORDER — IPRATROPIUM-ALBUTEROL 0.5-2.5 (3) MG/3ML IN SOLN
3.0000 mL | Freq: Four times a day (QID) | RESPIRATORY_TRACT | Status: DC
  Administered 2024-05-16: 17:00:00 3 mL via RESPIRATORY_TRACT
  Filled 2024-05-16 (×2): qty 3

## 2024-05-16 NOTE — Consult Note (Incomplete)
 Prior to discharge needs CTA results TTE results Final DAPT recommendations based on above Outpatient neuro f/u  Weightman, Laraya 115 Subacute R BG stroke  Tiffany Orozco is a 73 y.o. female with medical history significant of previous CVA maintained on Aggrenox , hypertension, hyperlipidemia, hypothyroidism, GERD who presents to the ED with approximately a 4 to 5-day history of left-sided facial droop associated with slightly deep left-sided grip strength and numbness on the right side of the face.  Family member also noted to be drooling.  Patient endorses compliance with her home Aggrenox .  She was last seen by Dr. Rosemarie stroke neurology in [place] in 2016 and has been on Aggrenox  since that time.   Stroke workup this admission:  MRI brain 1. Acute to early subacute right basal ganglia infarct. 2. Mild to moderate chronic small vessel ischemic disease.  CTA H&N - pending TTE - done, report pending  Stroke Labs     Component Value Date/Time   CHOL 128 05/16/2024 0508   TRIG 111 05/16/2024 0508   HDL 37 (L) 05/16/2024 0508   CHOLHDL 3.5 05/16/2024 0508   VLDL 22 05/16/2024 0508   LDLCALC 69 05/16/2024 0508   LDLDIRECT 103.0 09/02/2016 1156    Lab Results  Component Value Date/Time   HGBA1C 5.6 05/15/2024 11:34 AM   Etiology of stroke is ***

## 2024-05-16 NOTE — Evaluation (Signed)
 Occupational Therapy Evaluation Patient Details Name: Tiffany Orozco MRN: [id number] DOB: [birth year] Today's Date: 05/16/2024   History of Present Illness   73 y.o. female who presented to ED on 05/15/24 with c/o left facial droop for multiple days, left-sided weakness, urinary urgency and nausea. Imaging revealed acute to subacute right basal ganglia infarct. PMH significant for CVA (2013), COPD, IBS, HLD, hyponatremia, hypothyroidism, HTN.     Clinical Impressions Pt admitted with above. Prior to admission, pt lives with family and was independent in ADL/mobility performance, does not drive. Pt endorses she is at or near baseline with the exception of mild fatigue, has been performing ADLs independently in room. Endorses R hand numbness/tingling (wears glove) at baseline, mild L sided weakness but is functioning at mod independent (increased time) - independent level without AD. Pt able to demonstrate mobility t/f bathroom no AD for toileting needs independently, no LOB while reaching outside BOS for dynamic tasks. No acute needs identified, OT to sign off.      If plan is discharge home, recommend the following:   Assist for transportation     Functional Status Assessment   Patient has not had a recent decline in their functional status     Equipment Recommendations   None recommended by OT      Precautions/Restrictions   Precautions Precautions: None Recall of Precautions/Restrictions: Intact Restrictions Weight Bearing Restrictions Per Provider Order: No     Mobility Bed Mobility Overal bed mobility: Independent                  Transfers Overall transfer level: Independent Equipment used: None                      Balance Overall balance assessment: Mild deficits observed, not formally tested                                         ADL either performed or assessed with clinical judgement   ADL Overall ADL's :  Independent                                       General ADL Comments: pt has been performing ADLs independently in room. demonstrated mobility t/f bathroom and toilet transfer without difficulties including grooming tasks while reaching outside BOS     Vision Baseline Vision/History: 1 Wears glasses Ability to See in Adequate Light: 0 Adequate Patient Visual Report: No change from baseline (does note she has an eye appt coming up for potential cateract sx) Additional Comments: able to read name badge and clock on wall WNL            Pertinent Vitals/Pain Pain Assessment Pain Assessment: No/denies pain     Extremity/Trunk Assessment Upper Extremity Assessment Upper Extremity Assessment: Generalized weakness;Right hand dominant (pt endorses R hand numbness/tingling (chronic, wears glove))   Lower Extremity Assessment Lower Extremity Assessment: Generalized weakness   Cervical / Trunk Assessment Cervical / Trunk Assessment: Normal   Communication Communication Communication: Impaired Factors Affecting Communication: Hearing impaired   Cognition Arousal: Alert Behavior During Therapy: WFL for tasks assessed/performed Cognition: No apparent impairments  Following commands: Intact       Cueing  General Comments   Cueing Techniques: Verbal cues  VSS           Home Living Family/patient expects to be discharged to:: Private residence Living Arrangements: Spouse/significant other;Children Available Help at Discharge: Family Type of Home: House Home Access: Stairs to enter Secretary/administrator of Steps: 2   Home Layout: One level     Bathroom Shower/Tub: Producer, Television/film/video: Standard     Home Equipment: Shower seat          Prior Functioning/Environment Prior Level of Function : Independent/Modified Independent             Mobility Comments: no AD ADLs Comments:  independent including IADLs. does not drive.    OT Problem List: Decreased strength;Decreased activity tolerance;Impaired sensation        OT Goals(Current goals can be found in the care plan section)   Acute Rehab OT Goals OT Goal Formulation: All assessment and education complete, DC therapy   AM-PAC OT 6 Clicks Daily Activity     Outcome Measure Help from another person eating meals?: None Help from another person taking care of personal grooming?: None Help from another person toileting, which includes using toliet, bedpan, or urinal?: None Help from another person bathing (including washing, rinsing, drying)?: None Help from another person to put on and taking off regular upper body clothing?: None Help from another person to put on and taking off regular lower body clothing?: None 6 Click Score: 24   End of Session Nurse Communication: Mobility status  Activity Tolerance: Patient tolerated treatment well;No increased pain Patient left: in bed;with call bell/phone within reach  OT Visit Diagnosis: Muscle weakness (generalized) (M62.81);Other symptoms and signs involving the nervous system (R29.898)                Time: 8585-8564 OT Time Calculation (min): 21 min Charges:  OT General Charges $OT Visit: 1 Visit OT Evaluation $OT Eval Low Complexity: 1 Low Janus Vlcek L. Bailea Beed, OTR/L  05/16/2024, 2:44 PM

## 2024-05-16 NOTE — Evaluation (Signed)
 Clinical/Bedside Swallow Evaluation Patient Details  Name: Tiffany Orozco MRN: [id number] Date of Birth: Sep 13, 1950  Today's Date: 05/16/2024 Time: SLP Start Time (ACUTE ONLY): 1111 SLP Stop Time (ACUTE ONLY): 1125 SLP Time Calculation (min) (ACUTE ONLY): 14 min  Past Medical History:  Past Medical History:  Diagnosis Date   Allergy    Anxiety    COPD (chronic obstructive pulmonary disease) (HCC)    DDD (degenerative disc disease), lumbar    mild   Depression    Female rectocele without uterine prolapse    GERD (gastroesophageal reflux disease)    Hearing loss    Heart murmur    Hyperlipidemia    Hypertension    Hypothyroidism    IBS (irritable bowel syndrome)    Influenza A 06/2015   Intractable nausea and vomiting 03/01/2022   Obesity    Osteopenia    Plantar fasciitis    Pneumonia    Skin cancer of arm    Skin cancer of face    Stroke (HCC) 07/2010   no permenant deficits   Thrombocytopenia    Tubal pregnancy    Past Surgical History:  Past Surgical History:  Procedure Laterality Date   APPENDECTOMY     CHOLECYSTECTOMY  2004   COLONOSCOPY     COLONOSCOPY WITH PROPOFOL  N/A 10/21/2022   Procedure: COLONOSCOPY WITH PROPOFOL ;  Surgeon: Onita Elspeth Sharper, DO;  Location: [facility] ENDOSCOPY;  Service: Gastroenterology;  Laterality: N/A;   CYST EXCISION Right    breast mole   EMGs  11/2002   atms and wrists neg   ESOPHAGEAL MANOMETRY N/A 03/17/2015   Procedure: ESOPHAGEAL MANOMETRY (EM);  Surgeon: Elspeth Deward Naval, MD;  Location: [facility] ENDOSCOPY;  Service: Gastroenterology;  Laterality: N/A;   ESOPHAGOGASTRODUODENOSCOPY N/A 03/02/2022   Procedure: ESOPHAGOGASTRODUODENOSCOPY (EGD);  Surgeon: Onita Elspeth Sharper, DO;  Location: [facility] ENDOSCOPY;  Service: Gastroenterology;  Laterality: N/A;   ESOPHAGOGASTRODUODENOSCOPY (EGD) WITH PROPOFOL  N/A 05/06/2022   Procedure: ESOPHAGOGASTRODUODENOSCOPY (EGD) WITH PROPOFOL ;  Surgeon: Onita Elspeth Sharper, DO;  Location: [facility]  ENDOSCOPY;  Service: Gastroenterology;  Laterality: N/A;   FOOT SURGERY Right 2008, 2009, 2010   torn ligament, hammertoes   HERNIA REPAIR     TUBAL LIGATION  1975   UPPER GI ENDOSCOPY     XI ROBOTIC ASSISTED VENTRAL HERNIA N/A 04/14/2020   Procedure: XI ROBOTIC ASSISTED VENTRAL HERNIA;  Surgeon: Marolyn Nest, MD;  Location: [facility] ORS;  Service: General;  Laterality: N/A;   HPI:  Clarity K Gerry is a 73 y.o. female with medical history significant of previous CVA maintained on Aggrenox , hypertension, hyperlipidemia, hypothyroidism, GERD who presents to the ED on 05/15/2024 with approximately a 4 to 5-day history of left-sided facial droop associated with slightly deep left-sided grip strength and numbness on the right side of the face.  Family member also noted to be drooling.  Patient endorses compliance with her home Aggrenox .  She was last seen by Dr. Rosemarie stroke neurology in [place] in 2016 and has been on Aggrenox  since that time.  MRI revealed There is an acute to early subacute infarct involving the posterior right basal ganglia and corona radiata. Patchy T2 hyperintensities elsewhere in the cerebral white matter bilaterally have greatly progressed from the remote MRI and are nonspecific but compatible with mild to moderate chronic small vessel ischemic disease. Chronic lacunar infarcts are noted in the thalami and left  basal ganglia. No intracranial hemorrhage, mass, midline shift, hydrocephalus, or extra-axial fluid collection is evident. Mild cerebral atrophy is within normal limits  for age.    Assessment / Plan / Recommendation  Clinical Impression  ST services were consulted as pt failed stroke swallow screen twice - per pt's nurse, pt with bilateral labial spillage, pt largely unaware. During this evaluation, pt with subtle facial asymmetry and decreased right facial weakness observed during smile and lingual protrusion.  Per chart review this is not an acute finding today. Pt  presents with adequate oropharyngeal abilities when consuming ice chips, thin liquids via straw, applesauce and graham crackers. While pt has significant history of esophageal evaluations/treatments, she didn't display any overt s/s of esophageal issues during this evaluation. At this time, pt's risk of aspiration appears reduced when consuming a regular diet with thin liquids, medicine whole with thin liquids. Skilled ST services are not indicated for dysphagia. SLP Visit Diagnosis: Dysphagia, unspecified (R13.10)    Aspiration Risk  No limitations    Diet Recommendation    Regular with thin liquids, medicine whole with thin liquids       Other Recommendations Oral Care Recommendations: Oral care BID     Swallow Evaluation Recommendations Recommendations: PO diet PO Diet Recommendation: Regular;Thin liquids (Level 0) Liquid Administration via: Cup;Straw Medication Administration: Whole meds with liquid Supervision: Intermittent supervision/cueing for swallowing strategies Swallowing strategies  : Minimize environmental distractions;Slow rate;Small bites/sips Postural changes: Position pt fully upright for meals;Stay upright 30-60 min after meals Oral care recommendations: Oral care BID (2x/day)   Functional Status Assessment Patient has not had a recent decline in their functional status    Swallow Study   General Date of Onset: 05/15/24 [name] is a 73 y.o. female with medical history significant of previous CVA maintained on Aggrenox , hypertension, hyperlipidemia, hypothyroidism, GERD who presents to the ED on 05/15/2024 with approximately a 4 to 5-day history of left-sided facial droop associated with slightly deep left-sided grip strength and numbness on the right side of the face.  Family member also noted to be drooling.  Patient endorses compliance with her home Aggrenox .  She was last seen by Dr. Rosemarie stroke neurology in [place] in 2016 and has been on Aggrenox   since that time.  MRI revealed There is an acute to early subacute infarct involving the posterior right basal ganglia and corona radiata. Patchy T2 hyperintensities elsewhere in the cerebral white matter bilaterally have greatly progressed from the remote MRI and are nonspecific but compatible with mild to moderate chronic small vessel ischemic disease. Chronic lacunar infarcts are noted in the thalami and left  basal ganglia. No intracranial hemorrhage, mass, midline shift, hydrocephalus, or extra-axial fluid collection is evident. Mild cerebral atrophy is within normal limits for age. Type of Study: Bedside Swallow Evaluation Previous Swallow Assessment: 2017 Diet Prior to this Study: NPO Temperature Spikes Noted: No Respiratory Status: Room air History of Recent Intubation: No Behavior/Cognition: Alert;Cooperative Oral Cavity Assessment: Within Functional Limits Oral Care Completed by SLP: No Oral Cavity - Dentition: Adequate natural dentition Vision: Functional for self-feeding Self-Feeding Abilities: Able to feed self Patient Positioning: Upright in bed Baseline Vocal Quality: Normal Volitional Cough: Strong Volitional Swallow: Able to elicit    Oral/Motor/Sensory Function Overall Oral Motor/Sensory Function: Mild impairment Facial ROM: Reduced right Facial Symmetry: Abnormal symmetry right Facial Strength: Reduced right Facial Sensation: Reduced right Lingual ROM: Reduced right Lingual Symmetry: Abnormal symmetry right Lingual Strength: Within Functional Limits Lingual Sensation: Within Functional Limits Velum: Within Functional Limits Mandible: Within Functional Limits   Ice Chips Ice chips: Within functional limits Presentation: Spoon;Self Fed   Thin  Liquid Thin Liquid: Within functional limits Presentation: Self Fed;Straw    Nectar Thick Nectar Thick Liquid: Not tested   Honey Thick Honey Thick Liquid: Not tested   Puree Puree: Within functional limits Presentation:  Self Fed;Spoon   Solid     Solid: Within functional limits Presentation: Self Fed     Lindzie Boxx B. Rubbie, M.S., CCC-SLP, Tree Surgeon Certified Brain Injury Specialist [facility]  [facility] Rehabilitation Services Office [phone] [place] [phone] Fax [phone]

## 2024-05-16 NOTE — Plan of Care (Signed)
 Problem: Education: Goal: Knowledge of disease or condition will improve 05/16/2024 1913 by Merilee Izetta SAUNDERS, LPN Outcome: Progressing 05/16/2024 1913 by Merilee Izetta SAUNDERS, LPN Outcome: Progressing Goal: Knowledge of secondary prevention will improve (MUST DOCUMENT ALL) 05/16/2024 1913 by Merilee Izetta SAUNDERS, LPN Outcome: Progressing 05/16/2024 1913 by Merilee, Nikitta Sobiech R, LPN Outcome: Progressing Goal: Knowledge of patient specific risk factors will improve (DELETE if not current risk factor) 05/16/2024 1913 by Merilee, Lavada Langsam R, LPN Outcome: Progressing 05/16/2024 1913 by Merilee, Anabella Capshaw R, LPN Outcome: Progressing   Problem: Ischemic Stroke/TIA Tissue Perfusion: Goal: Complications of ischemic stroke/TIA will be minimized 05/16/2024 1913 by Merilee Izetta SAUNDERS, LPN Outcome: Progressing 05/16/2024 1913 by Merilee, Janetta Vandoren R, LPN Outcome: Progressing   Problem: Coping: Goal: Will verbalize positive feelings about self 05/16/2024 1913 by Merilee Izetta SAUNDERS, LPN Outcome: Progressing 05/16/2024 1913 by Merilee, Pearly Apachito R, LPN Outcome: Progressing Goal: Will identify appropriate support needs 05/16/2024 1913 by Merilee, Rubi Tooley R, LPN Outcome: Progressing 05/16/2024 1913 by Merilee, Evagelia Knack R, LPN Outcome: Progressing   Problem: Health Behavior/Discharge Planning: Goal: Ability to manage health-related needs will improve 05/16/2024 1913 by Merilee, Senai Kingsley R, LPN Outcome: Progressing 05/16/2024 1913 by Merilee, Shonita Rinck R, LPN Outcome: Progressing Goal: Goals will be collaboratively established with patient/family 05/16/2024 1913 by Merilee, Evonna Stoltz R, LPN Outcome: Progressing 05/16/2024 1913 by Merilee, Brenyn Petrey R, LPN Outcome: Progressing   Problem: Self-Care: Goal: Ability to participate in self-care as condition permits will improve 05/16/2024 1913 by Merilee, Jniyah Dantuono R, LPN Outcome: Progressing 05/16/2024 1913 by Merilee, Cem Kosman R, LPN Outcome: Progressing Goal: Verbalization  of feelings and concerns over difficulty with self-care will improve 05/16/2024 1913 by Merilee, Chaniqua Brisby R, LPN Outcome: Progressing 05/16/2024 1913 by Merilee, Myalynn Lingle R, LPN Outcome: Progressing Goal: Ability to communicate needs accurately will improve 05/16/2024 1913 by Merilee, Bessy Reaney R, LPN Outcome: Progressing 05/16/2024 1913 by Merilee, Kamarie Palma R, LPN Outcome: Progressing   Problem: Nutrition: Goal: Risk of aspiration will decrease 05/16/2024 1913 by Merilee Izetta SAUNDERS, LPN Outcome: Progressing 05/16/2024 1913 by Merilee, Ashtynn Berke R, LPN Outcome: Progressing Goal: Dietary intake will improve 05/16/2024 1913 by Merilee Izetta SAUNDERS, LPN Outcome: Progressing 05/16/2024 1913 by Merilee, Jonai Weyland R, LPN Outcome: Progressing   Problem: Education: Goal: Knowledge of General Education information will improve Description: Including pain rating scale, medication(s)/side effects and non-pharmacologic comfort measures 05/16/2024 1913 by Merilee Izetta SAUNDERS, LPN Outcome: Progressing 05/16/2024 1913 by Merilee, Bijan Ridgley R, LPN Outcome: Progressing   Problem: Health Behavior/Discharge Planning: Goal: Ability to manage health-related needs will improve 05/16/2024 1913 by Merilee, Odalys Win R, LPN Outcome: Progressing 05/16/2024 1913 by Merilee, Allyna Pittsley R, LPN Outcome: Progressing   Problem: Clinical Measurements: Goal: Ability to maintain clinical measurements within normal limits will improve 05/16/2024 1913 by Merilee, Shashank Kwasnik R, LPN Outcome: Progressing 05/16/2024 1913 by Merilee, Jeanna Giuffre R, LPN Outcome: Progressing Goal: Will remain free from infection 05/16/2024 1913 by Merilee, Aislynn Cifelli R, LPN Outcome: Progressing 05/16/2024 1913 by Merilee, Arline Ketter R, LPN Outcome: Progressing Goal: Diagnostic test results will improve 05/16/2024 1913 by Merilee, Aesha Agrawal R, LPN Outcome: Progressing 05/16/2024 1913 by Merilee, Meleane Selinger R, LPN Outcome: Progressing Goal: Respiratory complications will  improve 05/16/2024 1913 by Merilee Izetta SAUNDERS, LPN Outcome: Progressing 05/16/2024 1913 by Merilee, Zadkiel Dragan R, LPN Outcome: Progressing Goal: Cardiovascular complication will be avoided 05/16/2024 1913 by Merilee Izetta SAUNDERS, LPN Outcome: Progressing 05/16/2024 1913 by Merilee Izetta SAUNDERS, LPN Outcome: Progressing   Problem: Activity: Goal: Risk for activity intolerance will decrease 05/16/2024 1913 by Merilee Izetta SAUNDERS, LPN Outcome: Progressing 05/16/2024 1913 by  Merilee, Skyy Mcknight R, LPN Outcome: Progressing   Problem: Nutrition: Goal: Adequate nutrition will be maintained 05/16/2024 1913 by Merilee Izetta SAUNDERS, LPN Outcome: Progressing 05/16/2024 1913 by Merilee Izetta SAUNDERS, LPN Outcome: Progressing   Problem: Coping: Goal: Level of anxiety will decrease 05/16/2024 1913 by Merilee Izetta SAUNDERS, LPN Outcome: Progressing 05/16/2024 1913 by Merilee, Jermall Isaacson R, LPN Outcome: Progressing   Problem: Elimination: Goal: Will not experience complications related to bowel motility 05/16/2024 1913 by Merilee Izetta SAUNDERS, LPN Outcome: Progressing 05/16/2024 1913 by Merilee, Jeriel Vivanco R, LPN Outcome: Progressing Goal: Will not experience complications related to urinary retention 05/16/2024 1913 by Merilee Izetta SAUNDERS, LPN Outcome: Progressing 05/16/2024 1913 by Merilee, Evalyn Shultis R, LPN Outcome: Progressing   Problem: Pain Managment: Goal: General experience of comfort will improve and/or be controlled 05/16/2024 1913 by Merilee, Jt Brabec R, LPN Outcome: Progressing 05/16/2024 1913 by Merilee, Doral Ventrella R, LPN Outcome: Progressing   Problem: Safety: Goal: Ability to remain free from injury will improve 05/16/2024 1913 by Merilee, Janise Gora R, LPN Outcome: Progressing 05/16/2024 1913 by Merilee, Vaani Morren R, LPN Outcome: Progressing   Problem: Skin Integrity: Goal: Risk for impaired skin integrity will decrease 05/16/2024 1913 by Merilee Izetta SAUNDERS, LPN Outcome: Progressing 05/16/2024 1913 by Merilee Izetta SAUNDERS, LPN Outcome: Progressing

## 2024-05-16 NOTE — Evaluation (Signed)
 Physical Therapy Evaluation Patient Details Name: Tiffany Orozco MRN: [id number] DOB: 05-09-1951 Today's Date: 05/16/2024  History of Present Illness  73 y.o. female who presented to ED on 05/15/24 with c/o left facial droop for multiple days, left-sided weakness, urinary urgency and nausea. Imaging revealed acute to subacute right basal ganglia infarct. PMH significant for CVA (2013), COPD, IBS, HLD, hyponatremia, hypothyroidism, HTN.  Clinical Impression  Patient admitted with the above. PTA, patient lives with husband and daughter and was independent with no AD. Currently with mild L sided weakness, however functioning at modI level with no AD. Patient is frustrated throughout session about current situation and awaiting SLP evaluation. Per patient, she is at baseline. No further skilled PT needs identified acutely. No PT follow up recommended at this time. PT will sign off.       Equipment Recommendations None recommended by PT  Recommendations for Other Services       Functional Status Assessment Patient has not had a recent decline in their functional status     Precautions / Restrictions Precautions Precautions: None Recall of Precautions/Restrictions: Intact Restrictions Weight Bearing Restrictions Per Provider Order: No      Mobility  Bed Mobility Overal bed mobility: Independent                  Transfers Overall transfer level: Independent Equipment used: None                    Ambulation/Gait Ambulation/Gait assistance: Modified independent (Device/Increase time) Gait Distance (Feet): 100 Feet Assistive device: None Gait Pattern/deviations: WFL(Within Functional Limits)   Gait velocity interpretation: >2.62 ft/sec, indicative of community ambulatory      Stairs            Wheelchair Mobility     Tilt Bed    Modified Rankin (Stroke Patients Only)       Balance Overall balance assessment: Mild deficits observed, not formally  tested                                           Pertinent Vitals/Pain Pain Assessment Pain Assessment: No/denies pain    Home Living Family/patient expects to be discharged to:: Private residence Living Arrangements: Spouse/significant other;Children Available Help at Discharge: Family Type of Home: House Home Access: Stairs to enter   Secretary/administrator of Steps: 2   Home Layout: One level Home Equipment: Information systems manager      Prior Function Prior Level of Function : Independent/Modified Independent             Mobility Comments: no AD       Extremity/Trunk Assessment   Upper Extremity Assessment Upper Extremity Assessment: Generalized weakness;Right hand dominant    Lower Extremity Assessment Lower Extremity Assessment: Generalized weakness    Cervical / Trunk Assessment Cervical / Trunk Assessment: Normal  Communication   Communication Communication: Impaired Factors Affecting Communication: Hearing impaired    Cognition Arousal: Alert Behavior During Therapy: Agitated   PT - Cognitive impairments: No family/caregiver present to determine baseline                       PT - Cognition Comments: decreased awareness into current situation regarding swallowing Following commands: Intact       Cueing       General Comments      Exercises  Assessment/Plan    PT Assessment Patient does not need any further PT services  PT Problem List         PT Treatment Interventions      PT Goals (Current goals can be found in the Care Plan section)  Acute Rehab PT Goals Patient Stated Goal: to eat PT Goal Formulation: All assessment and education complete, DC therapy    Frequency       Co-evaluation               AM-PAC PT 6 Clicks Mobility  Outcome Measure Help needed turning from your back to your side while in a flat bed without using bedrails?: None Help needed moving from lying on your back to  sitting on the side of a flat bed without using bedrails?: None Help needed moving to and from a bed to a chair (including a wheelchair)?: None Help needed standing up from a chair using your arms (e.g., wheelchair or bedside chair)?: None Help needed to walk in hospital room?: None Help needed climbing 3-5 steps with a railing? : None 6 Click Score: 24    End of Session   Activity Tolerance: Patient tolerated treatment well Patient left: in bed;with call bell/phone within reach;with nursing/sitter in room Nurse Communication: Mobility status PT Visit Diagnosis: Muscle weakness (generalized) (M62.81)    Time: 8980-8963 PT Time Calculation (min) (ACUTE ONLY): 17 min   Charges:   PT Evaluation $PT Eval Low Complexity: 1 Low   PT General Charges $$ ACUTE PT VISIT: 1 Visit         Maryanne Finder, PT, DPT Physical Therapist - [facility]  [facility]   Teran Knittle A Denny Mccree 05/16/2024, 10:42 AM

## 2024-05-16 NOTE — Discharge Instructions (Signed)
 Dear Neldon MARLA [place],   Congratulations for your interest in quitting smoking!  Find a program that suits you best: when you want to quit, how you need support, where you live, and how you like to learn.    If youre ready to get started TODAY, consider scheduling a visit through [email or web address].  Appointments are available from 8am to 8pm, Monday to Friday.   Most health insurance plans will cover some level of tobacco cessation visits and medications.    Additional Resources: [employer] are also available to help you quit & provide the support youll need. Many programs are available in both English and Spanish and have a long history of successfully helping people get off and stay off tobacco.    Quit Smoking Apps:  quitSTART at seriousbroker.de QuitGuide?at forgetparking.dk Online education and resources: Smokefree  at borders group.gov Free Telephone Coaching: QuitNow,  Call 1-800-QUIT-NOW ((601)566-8196) or Text- Ready to 857-273-7626 *Quitline [place] has teamed up with Medicaid to offer a free 14 week program    Vaping- Want to Quit? Free 24/7 support. Call [facility]  [place], [place], [place], [place], [place]  [facility]

## 2024-05-16 NOTE — Care Management Obs Status (Signed)
 MEDICARE OBSERVATION STATUS NOTIFICATION   Patient Details  Name: Tiffany Orozco MRN: [id number] Date of Birth: [birth year]   Medicare Observation Status Notification Given:  Yes    Lasean Rahming W, CMA 05/16/2024, 3:33 PM

## 2024-05-16 NOTE — Progress Notes (Addendum)
 TOC Brief Assessment:  Patient is from home. Admitted for stroke w/u. No TOC needs at this time.

## 2024-05-16 NOTE — Plan of Care (Signed)

## 2024-05-17 ENCOUNTER — Other Ambulatory Visit: Payer: Self-pay

## 2024-05-17 DIAGNOSIS — I6381 Other cerebral infarction due to occlusion or stenosis of small artery: Principal | ICD-10-CM

## 2024-05-17 DIAGNOSIS — R29702 NIHSS score 2: Secondary | ICD-10-CM | POA: Diagnosis not present

## 2024-05-17 DIAGNOSIS — I639 Cerebral infarction, unspecified: Secondary | ICD-10-CM | POA: Diagnosis not present

## 2024-05-17 LAB — GLUCOSE, CAPILLARY: Glucose-Capillary: 101 mg/dL — ABNORMAL HIGH (ref 70–99)

## 2024-05-17 MED ORDER — IPRATROPIUM-ALBUTEROL 0.5-2.5 (3) MG/3ML IN SOLN
3.0000 mL | Freq: Three times a day (TID) | RESPIRATORY_TRACT | Status: DC
  Administered 2024-05-17: 09:00:00 3 mL via RESPIRATORY_TRACT
  Filled 2024-05-17: qty 3

## 2024-05-17 MED ORDER — CLOPIDOGREL BISULFATE 75 MG PO TABS
75.0000 mg | ORAL_TABLET | Freq: Every day | ORAL | 1 refills | Status: DC
  Filled 2024-05-17: qty 30, 30d supply, fill #0

## 2024-05-17 MED ORDER — ASPIRIN 81 MG PO TBEC: 81.0000 mg | DELAYED_RELEASE_TABLET | Freq: Every day | ORAL | Status: AC

## 2024-05-17 MED FILL — Albuterol Sulfate Inhal Aero 108 MCG/ACT (90MCG Base Equiv): 2.0000 | RESPIRATORY_TRACT | 25 days supply | Qty: 6.7 | Fill #0 | Status: AC

## 2024-05-17 NOTE — Consult Note (Incomplete)
 NEUROLOGY CONSULT NOTE   Date of service: May 17, 2024 Patient Name: Tiffany Orozco MRN:  [id number] DOB:  11-Jan-1951 Chief Complaint: *** Requesting Provider: Jhonny Calvin NOVAK, MD  History of Present Illness  Tiffany Orozco is a 73 y.o. female with hx of ***  NIHSS = 2 for L facial droop and sensory deficit   ROS   Comprehensive ROS performed and pertinent positives documented in HPI   Past History   Past Medical History:  Diagnosis Date   Allergy    Anxiety    COPD (chronic obstructive pulmonary disease) (HCC)    DDD (degenerative disc disease), lumbar    mild   Depression    Female rectocele without uterine prolapse    GERD (gastroesophageal reflux disease)    Hearing loss    Heart murmur    Hyperlipidemia    Hypertension    Hypothyroidism    IBS (irritable bowel syndrome)    Influenza A 06/2015   Intractable nausea and vomiting 03/01/2022   Obesity    Osteopenia    Plantar fasciitis    Pneumonia    Skin cancer of arm    Skin cancer of face    Stroke (HCC) 07/2010   no permenant deficits   Thrombocytopenia    Tubal pregnancy     Past Surgical History:  Procedure Laterality Date   APPENDECTOMY     CHOLECYSTECTOMY  2004   COLONOSCOPY     COLONOSCOPY WITH PROPOFOL  N/A 10/21/2022   Procedure: COLONOSCOPY WITH PROPOFOL ;  Surgeon: Onita Elspeth Sharper, DO;  Location: [facility] ENDOSCOPY;  Service: Gastroenterology;  Laterality: N/A;   CYST EXCISION Right    breast mole   EMGs  11/2002   atms and wrists neg   ESOPHAGEAL MANOMETRY N/A 03/17/2015   Procedure: ESOPHAGEAL MANOMETRY (EM);  Surgeon: Elspeth Deward Naval, MD;  Location: [facility] ENDOSCOPY;  Service: Gastroenterology;  Laterality: N/A;   ESOPHAGOGASTRODUODENOSCOPY N/A 03/02/2022   Procedure: ESOPHAGOGASTRODUODENOSCOPY (EGD);  Surgeon: Onita Elspeth Sharper, DO;  Location: [facility] ENDOSCOPY;  Service: Gastroenterology;  Laterality: N/A;   ESOPHAGOGASTRODUODENOSCOPY (EGD) WITH PROPOFOL  N/A 05/06/2022    Procedure: ESOPHAGOGASTRODUODENOSCOPY (EGD) WITH PROPOFOL ;  Surgeon: Onita Elspeth Sharper, DO;  Location: [facility] ENDOSCOPY;  Service: Gastroenterology;  Laterality: N/A;   FOOT SURGERY Right 2008, 2009, 2010   torn ligament, hammertoes   HERNIA REPAIR     TUBAL LIGATION  1975   UPPER GI ENDOSCOPY     XI ROBOTIC ASSISTED VENTRAL HERNIA N/A 04/14/2020   Procedure: XI ROBOTIC ASSISTED VENTRAL HERNIA;  Surgeon: Marolyn Nest, MD;  Location: [facility] ORS;  Service: General;  Laterality: N/A;    Family History: Family History  Problem Relation Age of Onset   Skin cancer Father    Hypertension Mother    Lung cancer Brother    Colon cancer Sister    Esophageal cancer Sister    Ovarian cancer Sister    Stomach cancer Neg Hx    Pancreatic cancer Neg Hx     Social History  reports that she has been smoking cigarettes. She has a 23.3 pack-year smoking history. She has never used smokeless tobacco. She reports that she does not drink alcohol and does not use drugs.  Allergies[1]  Medications  Current Medications[2]  Vitals   Vitals:   05/16/24 1954 05/16/24 2354 05/17/24 0226 05/17/24 0354  BP: (!) 108/52 (!) 128/57  123/85  Pulse: 77 64  80  Resp:      Temp: 98.2 F (36.8 C) 97.7 F (  36.5 C)  97.7 F (36.5 C)  TempSrc: Oral Oral  Oral  SpO2: 97% 96% 95% 97%  Weight:      Height:        Body mass index is 36.58 kg/m.   Physical Exam   Gen: patient lying in bed, NAD CV: extremities appear well-perfused Resp: normal WOB  Neurologic exam MS: alert, oriented x4, follows commands Speech: no dysarthria, no aphasia CN: PERRL, VFF, EOMI, sensation intact, L lower facial numbness, hearing intact to voice Motor: 5/5 strength throughout Sensory: SILT except decreased R hand chronic x6 wks Reflexes: 2+ symm with toes down bilat Coordination: FNF intact bilat Gait: deferred  Labs/Imaging/Neurodiagnostic studies   CBC:  Recent Labs  Lab 2024-06-11 1136  WBC 6.1   NEUTROABS 3.5  HGB 15.2*  HCT 45.5  MCV 91.9  PLT 309   Basic Metabolic Panel:  Lab Results  Component Value Date   NA 130 (L) Jun 11, 2024   K 3.7 2024-06-11   CO2 23 2024/06/11   GLUCOSE 108 (H) 11-Jun-2024   BUN 13 06/11/2024   CREATININE 0.60 06-11-24   CALCIUM 8.9 06/11/2024   GFRNONAA >60 06/11/24   GFRAA >60 06/16/2015   Lipid Panel:  Lab Results  Component Value Date   LDLCALC 69 05/16/2024   HgbA1c:  Lab Results  Component Value Date   HGBA1C 5.6 06-11-24   Urine Drug Screen: No results found for: LABOPIA, COCAINSCRNUR, LABBENZ, AMPHETMU, THCU, LABBARB  Alcohol Level     Component Value Date/Time   [facility] <15 2024-06-11 1136   INR  Lab Results  Component Value Date   INR 0.9 2024/06/11   APTT  Lab Results  Component Value Date   APTT 32 Jun 11, 2024   AED levels: No results found for: PHENYTOIN, ZONISAMIDE, LAMOTRIGINE, LEVETIRACETA  CT angio Head and Neck with contrast(Personally reviewed): 1. Stable nonhemorrhagic subacute infarct of the posterior right lentiform nucleus. 2. High grade, near occlusive stenosis in the proximal basilar artery just distal to the basilar junction. 3. High grade stenosis at the origin of the left vertebral artery. 4. Proximal right ICA stenosis of approximately 50% due to atherosclerotic calcifications. 5. Left carotid bifurcation and proximal ICA stenosis of approximately 55% due to calcified and noncalcified plaque.  MRI Brain(Personally reviewed): 1. Acute to early subacute right basal ganglia infarct. 2. Mild to moderate chronic small vessel ischemic disease.  TTE 1. Left ventricular ejection fraction, by estimation, is 60 to 65% . The left ventricle has normal function. The left ventricle has no regional wall motion abnormalities. Left ventricular diastolic parameters are consistent with Grade I diastolic dysfunction ( impaired relaxation) . 2. Right ventricular systolic function is normal.  The right ventricular size is normal. Tricuspid regurgitation signal is inadequate for assessing PA pressure. 3. The mitral valve is normal in structure. No evidence of mitral valve regurgitation. No evidence of mitral stenosis. 4. The aortic valve is normal in structure. Aortic valve regurgitation is not visualized. No aortic stenosis is present. 5. The inferior vena cava is normal in size with greater than 50% respiratory variability, suggesting right atrial pressure of 3 mmHg.  Stroke Labs     Component Value Date/Time   CHOL 128 05/16/2024 0508   TRIG 111 05/16/2024 0508   HDL 37 (L) 05/16/2024 0508   CHOLHDL 3.5 05/16/2024 0508   VLDL 22 05/16/2024 0508   LDLCALC 69 05/16/2024 0508   LDLDIRECT 103.0 09/02/2016 1156    Lab Results  Component Value Date/Time   HGBA1C  5.6 05/15/2024 11:34 AM     ASSESSMENT   Tiffany Orozco is a 73 y.o. female ***  RECOMMENDATIONS  *** ______________________________________________________________________    Signed, Elida CHRISTELLA Ross, MD [facility]     [1]  Allergies Allergen Reactions   Lisinopril  Shortness Of Breath   Niacin Other (See Comments)     dizziness   Welchol  [Colesevelam  Hcl] Diarrhea, Nausea Only, Swelling and Other (See Comments)     swelling in face   Augmentin  [Amoxicillin -Pot Clavulanate] Nausea Only    GI upset with taking.   Oxycodone Hcl     Patient states this medication makes her hyper  [2]  Current Facility-Administered Medications:    acetaminophen  (TYLENOL ) tablet 650 mg, 650 mg, Oral, Q4H PRN **OR** acetaminophen  (TYLENOL ) 160 MG/5ML solution 650 mg, 650 mg, Per Tube, Q4H PRN **OR** acetaminophen  (TYLENOL ) suppository 650 mg, 650 mg, Rectal, Q4H PRN, Sreenath, Sudheer B, MD   albuterol  (PROVENTIL ) (2.5 MG/3ML) 0.083% nebulizer solution 2.5 mg, 2.5 mg, Inhalation, Q6H PRN, Sreenath, Sudheer B, MD, 2.5 mg at 05/17/24 0226   amLODipine  (NORVASC ) tablet 2.5 mg, 2.5 mg, Oral, Daily, Sreenath,  Sudheer B, MD   aspirin  EC tablet 81 mg, 81 mg, Oral, Daily, Ross Elida M, MD, 81 mg at 05/16/24 1718   cholecalciferol  (VITAMIN D3) 25 MCG (1000 UNIT) tablet 5,000 Units, 5,000 Units, Oral, Daily, Sreenath, Sudheer B, MD, 5,000 Units at 05/16/24 1241   clopidogrel  (PLAVIX ) tablet 75 mg, 75 mg, Oral, Daily, Ross Elida M, MD, 75 mg at 05/16/24 1718   enoxaparin  (LOVENOX ) injection 40 mg, 40 mg, Subcutaneous, Q24H, Sreenath, Sudheer B, MD, 40 mg at 05/16/24 2124   ezetimibe  (ZETIA ) tablet 10 mg, 10 mg, Oral, Daily, Sreenath, Sudheer B, MD   fluticasone  (FLONASE ) 50 MCG/ACT nasal spray 1 spray, 1 spray, Each Nare, Daily, Sreenath, Sudheer B, MD   ipratropium-albuterol  (DUONEB) 0.5-2.5 (3) MG/3ML nebulizer solution 3 mL, 3 mL, Nebulization, TID, Sreenath, Sudheer B, MD   levothyroxine  (SYNTHROID ) tablet 75 mcg, 75 mcg, Oral, Q0600, Jhonny, Sudheer B, MD, 75 mcg at 05/17/24 0553   loperamide  (IMODIUM ) capsule 2 mg, 2 mg, Oral, PRN, Jhonny, Sudheer B, MD, 2 mg at 05/16/24 1717   ondansetron  (ZOFRAN ) injection 4 mg, 4 mg, Intravenous, Q6H PRN, Donati-Garmon, Natalie M, NP, 4 mg at 05/16/24 1718   pantoprazole  (PROTONIX ) EC tablet 40 mg, 40 mg, Oral, Daily, Sreenath, Sudheer B, MD, 40 mg at 05/16/24 1241   senna-docusate (Senokot-S) tablet 1 tablet, 1 tablet, Oral, QHS PRN, Sreenath, Sudheer B, MD   sodium chloride  flush (NS) 0.9 % injection 3 mL, 3 mL, Intravenous, Once, Tan, Lorelle Cummins, MD

## 2024-05-17 NOTE — Progress Notes (Signed)
 PROGRESS NOTE    REGENA DELUCCHI  FMW:[id number] DOB: [birth year] DOA: 05/15/2024 PCP: Avelina Greig BRAVO, MD    Brief Narrative:  73 y.o. female with medical history significant of previous CVA maintained on Aggrenox , hypertension, hyperlipidemia, hypothyroidism, GERD who presents to the ED with approximately a 4 to 5-day history of left-sided facial droop associated with slightly deep left-sided grip strength and numbness on the right side of the face.  Family member also noted to be drooling.  Patient endorses compliance with her home Aggrenox .  She was last seen by Dr. Rosemarie stroke neurology in [place] in 2016 and has been on Aggrenox  since that time.   On my evaluation the patient is resting in bed.  She appears uncomfortable.  She says her main complaint is difficulty breathing.  Her lungs are clear and she remains on room air.  All other vital signs stable.   MRI completed by EDP demonstrates acute to subacute right basal ganglia infarct   Assessment & Plan:   Principal Problem:   Acute CVA (cerebrovascular accident) (HCC)  Acute CVA Imaging consistent with acute to subacute right basal ganglia infarct Plan: Advance diet per SLP recs Neurology consult DAPT PT OT speech Normotensive BP goals   Essential hypertension Continue home amlodipine    Hyperlipidemia Continue home Zetia  Add lipid panel   Hypothyroidism Continue Synthroid    GERD Continue PPI      DVT prophylaxis: SQL Code Status: Full Family Communication: None today Disposition Plan: Anticipate discharge home in 24 hours     Level of care: Telemetry  Consultants:  Neurology  Procedures:  None  Antimicrobials: None   Subjective: Seen and examined.  Resting in bed.  No distress.  No complaints.  Objective: Vitals:   05/16/24 2354 05/17/24 0226 05/17/24 0354 05/17/24 0755  BP: (!) 128/57  123/85 (!) 116/50  Pulse: 64  80 (!) 58  Resp:    16  Temp: 97.7 F (36.5 C)  97.7 F (36.5 C)  98.2 F (36.8 C)  TempSrc: Oral  Oral   SpO2: 96% 95% 97% 97%  Weight:      Height:        Intake/Output Summary (Last 24 hours) at 05/17/2024 1129 Last data filed at 05/17/2024 1006 Gross per 24 hour  Intake 600 ml  Output --  Net 600 ml   Filed Weights   05/15/24 1130 05/15/24 2012  Weight: 79.4 kg 79.4 kg    Examination:  General exam: Appears calm and comfortable  Respiratory system: Clear to auscultation. Respiratory effort normal. Cardiovascular system: 1 S2, RRR, no murmurs, no pedal edema Gastrointestinal system: NT/ND, normal bowel sounds Central nervous system: Alert and oriented. No focal neurological deficits. Extremities: Symmetric 5 x 5 power. Skin: No rashes, lesions or ulcers Psychiatry: Judgement and insight appear normal. Mood & affect appropriate.     Data Reviewed: I have personally reviewed following labs and imaging studies  CBC: Recent Labs  Lab 05/15/24 1136  WBC 6.1  NEUTROABS 3.5  HGB 15.2*  HCT 45.5  MCV 91.9  PLT 309   Basic Metabolic Panel: Recent Labs  Lab 05/15/24 1136  NA 130*  K 3.7  CL 95*  CO2 23  GLUCOSE 108*  BUN 13  CREATININE 0.60  CALCIUM 8.9   GFR: Estimated Creatinine Clearance: 54 mL/min (by C-G formula based on SCr of 0.6 mg/dL). Liver Function Tests: Recent Labs  Lab 05/15/24 1136  AST 19  ALT 21  ALKPHOS 89  BILITOT 0.5  PROT 7.2  ALBUMIN 4.3   No results for input(s): LIPASE, AMYLASE in the last 168 hours. No results for input(s): AMMONIA in the last 168 hours. Coagulation Profile: Recent Labs  Lab 05/15/24 1136  INR 0.9   Cardiac Enzymes: No results for input(s): CKTOTAL, CKMB, CKMBINDEX, TROPONINI in the last 168 hours. BNP (last 3 results) No results for input(s): PROBNP in the last 8760 hours. HbA1C: Recent Labs    05/15/24 1134  HGBA1C 5.6   CBG: Recent Labs  Lab 05/15/24 1129 05/16/24 2140 05/17/24 0755  GLUCAP 108* 116* 101*   Lipid Profile: Recent  Labs    05/16/24 0508  CHOL 128  HDL 37*  LDLCALC 69  TRIG 888  CHOLHDL 3.5   Thyroid  Function Tests: No results for input(s): TSH, T4TOTAL, FREET4, T3FREE, THYROIDAB in the last 72 hours. Anemia Panel: No results for input(s): VITAMINB12, FOLATE, FERRITIN, TIBC, IRON, RETICCTPCT in the last 72 hours. Sepsis Labs: No results for input(s): PROCALCITON, LATICACIDVEN in the last 168 hours.  Recent Results (from the past 240 hours)  Resp panel by RT-PCR (RSV, Flu A&B, Covid) Anterior Nasal Swab     Status: None   Collection Time: 05/15/24  1:17 PM   Specimen: Anterior Nasal Swab  Result Value Ref Range Status   SARS Coronavirus 2 by RT PCR NEGATIVE NEGATIVE Final    Comment: (NOTE) SARS-CoV-2 target nucleic acids are NOT DETECTED.  The SARS-CoV-2 RNA is generally detectable in upper respiratory specimens during the acute phase of infection. The lowest concentration of SARS-CoV-2 viral copies this assay can detect is 138 copies/mL. A negative result does not preclude SARS-Cov-2 infection and should not be used as the sole basis for treatment or other patient management decisions. A negative result may occur with  improper specimen collection/handling, submission of specimen other than nasopharyngeal swab, presence of viral mutation(s) within the areas targeted by this assay, and inadequate number of viral copies(<138 copies/mL). A negative result must be combined with clinical observations, patient history, and epidemiological information. The expected result is Negative.  Fact Sheet for Patients:  bloggercourse.com  Fact Sheet for Healthcare Providers:  seriousbroker.it  This test is no t yet approved or cleared by the [place]  FDA and  has been authorized for detection and/or diagnosis of SARS-CoV-2 by FDA under an Emergency Use Authorization (EUA). This EUA will remain  in effect (meaning this  test can be used) for the duration of the COVID-19 declaration under Section 564(b)(1) of the Act, 21 U.S.C.section 360bbb-3(b)(1), unless the authorization is terminated  or revoked sooner.       Influenza A by PCR NEGATIVE NEGATIVE Final   Influenza B by PCR NEGATIVE NEGATIVE Final    Comment: (NOTE) The Xpert Xpress SARS-CoV-2/FLU/RSV plus assay is intended as an aid in the diagnosis of influenza from Nasopharyngeal swab specimens and should not be used as a sole basis for treatment. Nasal washings and aspirates are unacceptable for Xpert Xpress SARS-CoV-2/FLU/RSV testing.  Fact Sheet for Patients: bloggercourse.com  Fact Sheet for Healthcare Providers: seriousbroker.it  This test is not yet approved or cleared by the [place]  FDA and has been authorized for detection and/or diagnosis of SARS-CoV-2 by FDA under an Emergency Use Authorization (EUA). This EUA will remain in effect (meaning this test can be used) for the duration of the COVID-19 declaration under Section 564(b)(1) of the Act, 21 U.S.C. section 360bbb-3(b)(1), unless the authorization is terminated or revoked.     Resp Syncytial Virus by PCR  NEGATIVE NEGATIVE Final    Comment: (NOTE) Fact Sheet for Patients: bloggercourse.com  Fact Sheet for Healthcare Providers: seriousbroker.it  This test is not yet approved or cleared by the [place]  FDA and has been authorized for detection and/or diagnosis of SARS-CoV-2 by FDA under an Emergency Use Authorization (EUA). This EUA will remain in effect (meaning this test can be used) for the duration of the COVID-19 declaration under Section 564(b)(1) of the Act, 21 U.S.C. section 360bbb-3(b)(1), unless the authorization is terminated or revoked.  Performed at [facility], [address] [place], [place] [address]          Radiology  Studies: CT ANGIO HEAD NECK W WO CM Result Date: 05/16/2024 EXAM: CT HEAD WITHOUT CTA HEAD AND NECK WITH AND WITHOUT 05/16/2024 04:53:02 PM TECHNIQUE: CTA of the head and neck was performed with and without the administration of intravenous contrast. Noncontrast CT of the head with reconstructed 2-D images are also provided for review. Multiplanar 2D and/or 3D reformatted images are provided for review. Automated exposure control, iterative reconstruction, and/or weight based adjustment of the mA/kV was utilized to reduce the radiation dose to as low as reasonably achievable. COMPARISON: MR head without contrast 05/15/2024 and CT head without contrast 05/15/2024 CLINICAL HISTORY: Neuro deficit, acute, stroke suspected; subacute R BG stroke on MRI. Left sided weakness. FINDINGS: CT HEAD: BRAIN AND VENTRICLES: The nonhemorrhagic subacute infarct of the posterior right lentiform nucleus is stable. Scattered subcortical white matter hypoattenuation bilaterally is stable, mildly advanced for age. No acute intracranial hemorrhage. No mass effect or midline shift. No extra-axial fluid collection. Gray-white differentiation is maintained. No hydrocephalus. ORBITS: No acute abnormality. SINUSES: No acute abnormality. SOFT TISSUES AND SKULL: No acute abnormality. CTA NECK: AORTIC ARCH AND ARCH VESSELS: Atherosclerotic calcifications are present at the aortic arch and great vessel origins without focal stenosis or aneurysm. No dissection or arterial injury. No significant stenosis of the brachiocephalic or subclavian arteries. CERVICAL CAROTID ARTERIES: The proximal atherosclerotic calcifications are present with narrowing of the proximal right ICA to 2.2 mm. This compares with a more normal distal lumen of 4.4 mm, representing a 50% stenosis by NASCET criteria. Calcified and noncalcified plaque are present at the left carotid bifurcation and proximal ICA. Minimal luminal diameter is 1.9 mm. This compares with 4.4 mm more  distally, representing a 55% stenosis by NASCET criteria. No dissection or arterial injury. CERVICAL VERTEBRAL ARTERIES: The right vertebral artery is dominant. High grade stenosis is present at the origin of the left vertebral artery. No dissection or arterial injury. VISUALIZED LUNGS AND MEDIASTINUM: Unremarkable. SOFT TISSUES: No acute abnormality. BONES: Reversal of the normal cervical lordosis is present. Grade 1 degenerative anterolisthesis is present at C3-C4 and C4-C5. Chronic endplate changes are most evident at C5-C6 and C6-C7. No acute abnormality. CTA HEAD: ANTERIOR CIRCULATION: Atherosclerotic calcifications are present within the cavernous internal carotid arteries bilaterally without significant stenosis relative to the ICA terminus. No significant stenosis of the anterior cerebral arteries. No significant stenosis of the middle cerebral arteries. No aneurysm. POSTERIOR CIRCULATION: A high grade, near occlusive, stenosis is present in the proximal basilar artery just distal to the basilar junction. A fetal type right posterior cerebral artery is present. A prominent left posterior communicating artery contributes to the posterior cerebral artery. No significant stenosis of the posterior cerebral arteries. No significant stenosis of the vertebral arteries. No aneurysm. OTHER: No dural venous sinus thrombosis on this non-dedicated study. IMPRESSION: 1. Stable nonhemorrhagic subacute infarct of the posterior right  lentiform nucleus. 2. High grade, near occlusive stenosis in the proximal basilar artery just distal to the basilar junction. 3. High grade stenosis at the origin of the left vertebral artery. 4. Proximal right ICA stenosis of approximately 50% due to atherosclerotic calcifications. 5. Left carotid bifurcation and proximal ICA stenosis of approximately 55% due to calcified and noncalcified plaque. Electronically signed by: Lonni Necessary MD 05/16/2024 05:23 PM EST RP Workstation:  [id number]   ECHOCARDIOGRAM COMPLETE Result Date: 05/16/2024    ECHOCARDIOGRAM REPORT   Patient Name:   MIRTHA JAIN Date of Exam: 05/16/2024 Medical Rec #:  [id number]        Height:       58.0 in Accession #:    [id number]       Weight:       175.0 lb Date of Birth:  Nov 04, 1950        BSA:          1.721 m Patient Age:    73 years         BP:           129/69 mmHg Patient Gender: F                HR:           80 bpm. Exam Location:  [facility] Procedure: 2D Echo, Cardiac Doppler and Color Doppler (Both Spectral and Color            Flow Doppler were utilized during procedure). Indications:     Stroke I63.9  History:         Patient has no prior history of Echocardiogram examinations.                  Stroke.  Sonographer:     Rosina Dunk Referring Phys:  [id number] CALVIN KATHEE ROBSON Diagnosing Phys: Evalene Lunger MD IMPRESSIONS  1. Left ventricular ejection fraction, by estimation, is 60 to 65%. The left ventricle has normal function. The left ventricle has no regional wall motion abnormalities. Left ventricular diastolic parameters are consistent with Grade I diastolic dysfunction (impaired relaxation).  2. Right ventricular systolic function is normal. The right ventricular size is normal. Tricuspid regurgitation signal is inadequate for assessing PA pressure.  3. The mitral valve is normal in structure. No evidence of mitral valve regurgitation. No evidence of mitral stenosis.  4. The aortic valve is normal in structure. Aortic valve regurgitation is not visualized. No aortic stenosis is present.  5. The inferior vena cava is normal in size with greater than 50% respiratory variability, suggesting right atrial pressure of 3 mmHg. FINDINGS  Left Ventricle: Left ventricular ejection fraction, by estimation, is 60 to 65%. The left ventricle has normal function. The left ventricle has no regional wall motion abnormalities. Strain was performed and the global longitudinal strain is indeterminate. The left  ventricular internal cavity size was normal in size. There is no left ventricular hypertrophy. Left ventricular diastolic parameters are consistent with Grade I diastolic dysfunction (impaired relaxation). Right Ventricle: The right ventricular size is normal. No increase in right ventricular wall thickness. Right ventricular systolic function is normal. Tricuspid regurgitation signal is inadequate for assessing PA pressure. Left Atrium: Left atrial size was normal in size. Right Atrium: Right atrial size was normal in size. Pericardium: There is no evidence of pericardial effusion. Mitral Valve: The mitral valve is normal in structure. No evidence of mitral valve regurgitation. No evidence of mitral valve stenosis. MV peak gradient, 4.3 mmHg. The mean  mitral valve gradient is 2.0 mmHg. Tricuspid Valve: The tricuspid valve is normal in structure. Tricuspid valve regurgitation is not demonstrated. No evidence of tricuspid stenosis. Aortic Valve: The aortic valve is normal in structure. Aortic valve regurgitation is not visualized. No aortic stenosis is present. Aortic valve mean gradient measures 10.0 mmHg. Aortic valve peak gradient measures 19.6 mmHg. Aortic valve area, by VTI measures 1.81 cm. Pulmonic Valve: The pulmonic valve was normal in structure. Pulmonic valve regurgitation is not visualized. No evidence of pulmonic stenosis. Aorta: The aortic root is normal in size and structure. Venous: The inferior vena cava is normal in size with greater than 50% respiratory variability, suggesting right atrial pressure of 3 mmHg. IAS/Shunts: No atrial level shunt detected by color flow Doppler. Additional Comments: 3D was performed not requiring image post processing on an independent workstation and was indeterminate.  LEFT VENTRICLE PLAX 2D LVIDd:         4.00 cm     Diastology LVIDs:         1.80 cm     LV e' medial:    5.00 cm/s LV PW:         1.20 cm     LV E/e' medial:  12.9 LV IVS:        1.00 cm     LV e'  lateral:   6.20 cm/s LVOT diam:     2.00 cm     LV E/e' lateral: 10.4 LV SV:         76 LV SV Index:   44 LVOT Area:     3.14 cm  LV Volumes (MOD) LV vol d, MOD A2C: 36.9 ml LV vol d, MOD A4C: 43.6 ml LV vol s, MOD A2C: 15.3 ml LV vol s, MOD A4C: 8.7 ml LV SV MOD A2C:     21.6 ml LV SV MOD A4C:     43.6 ml LV SV MOD BP:      29.6 ml RIGHT VENTRICLE             IVC RV Basal diam:  3.20 cm     IVC diam: 1.60 cm RV Mid diam:    2.20 cm RV S prime:     12.60 cm/s  PULMONARY VEINS TAPSE (M-mode): 1.6 cm      A Reversal Duration: 137.00 msec                             A Reversal Velocity: 31.90 cm/s                             Diastolic Velocity:  29.90 cm/s                             S/D Velocity:        2.10                             Systolic Velocity:   62.30 cm/s LEFT ATRIUM             Index        RIGHT ATRIUM          Index LA diam:        3.70 cm 2.15 cm/m   RA Area:     9.58 cm LA Vol (A2C):  34.0 ml 19.76 ml/m  RA Volume:   17.30 ml 10.05 ml/m LA Vol (A4C):   20.7 ml 12.03 ml/m LA Biplane Vol: 27.0 ml 15.69 ml/m  AORTIC VALVE                     PULMONIC VALVE AV Area (Vmax):    1.73 cm      PV Vmax:        1.13 m/s AV Area (Vmean):   1.73 cm      PV Vmean:       75.600 cm/s AV Area (VTI):     1.81 cm      PV VTI:         0.222 m AV Vmax:           221.33 cm/s   PV Peak grad:   5.1 mmHg AV Vmean:          147.667 cm/s  PV Mean grad:   3.0 mmHg AV VTI:            0.420 m       RVOT Peak grad: 3 mmHg AV Peak Grad:      19.6 mmHg AV Mean Grad:      10.0 mmHg LVOT Vmax:         122.00 cm/s LVOT Vmean:        81.200 cm/s LVOT VTI:          0.242 m LVOT/AV VTI ratio: 0.58  AORTA Ao Root diam: 2.50 cm Ao Asc diam:  2.30 cm MITRAL VALVE MV Area (PHT): 3.12 cm    SHUNTS MV Area VTI:   3.06 cm    Systemic VTI:  0.24 m MV Peak grad:  4.3 mmHg    Systemic Diam: 2.00 cm MV Mean grad:  2.0 mmHg    Pulmonic VTI:  0.189 m MV Vmax:       1.04 m/s MV Vmean:      70.0 cm/s MV Decel Time: 243 msec MV E velocity:  64.50 cm/s MV A velocity: 99.60 cm/s MV E/A ratio:  0.65 Evalene Lunger MD Electronically signed by Evalene Lunger MD Signature Date/Time: 05/16/2024/3:34:38 PM    Final    CT ABDOMEN PELVIS W CONTRAST Result Date: 05/15/2024 CLINICAL DATA:  Lower abdominal pain, diarrhea EXAM: CT ABDOMEN AND PELVIS WITH CONTRAST TECHNIQUE: Multidetector CT imaging of the abdomen and pelvis was performed using the standard protocol following bolus administration of intravenous contrast. RADIATION DOSE REDUCTION: This exam was performed according to the departmental dose-optimization program which includes automated exposure control, adjustment of the mA and/or kV according to patient size and/or use of iterative reconstruction technique. CONTRAST:  OMNIPAQUE  IOHEXOL  300 MG/ML  SOLN COMPARISON:  February 26, 2022 FINDINGS: Lower chest: No acute abnormality. Hepatobiliary: No focal liver abnormality is seen. Status post cholecystectomy. No biliary dilatation. Pancreas: Unremarkable. No pancreatic ductal dilatation or surrounding inflammatory changes. Spleen: Normal in size without focal abnormality. Adrenals/Urinary Tract: Adrenal glands are unremarkable. Kidneys are normal, without renal calculi, focal lesion, or hydronephrosis. Bladder is unremarkable. Stomach/Bowel: Status post appendectomy. No definite evidence of bowel obstruction or inflammation. Vascular/Lymphatic: Aortic atherosclerosis. No enlarged abdominal or pelvic lymph nodes. Reproductive: Uterus and bilateral adnexa are unremarkable. Other: No abdominal wall hernia or abnormality. No abdominopelvic ascites. Musculoskeletal: No acute or significant osseous findings. IMPRESSION: 1. No acute abnormality seen in the abdomen or pelvis. 2. Aortic atherosclerosis. Aortic Atherosclerosis (ICD10-I70.0). Electronically Signed   By: Lynwood Landy Raddle  M.D.   On: 05/15/2024 15:33   MR BRAIN WO CONTRAST Result Date: 05/15/2024 EXAM: MRI BRAIN WITHOUT CONTRAST 05/15/2024  02:27:33 PM TECHNIQUE: Multiplanar multisequence MRI of the head/brain was performed without the administration of intravenous contrast. COMPARISON: Head CT 05/15/2024 and MRI 01/25/2015. CLINICAL HISTORY: Neuro deficit, acute, stroke suspected. Left facial droop. FINDINGS: BRAIN AND VENTRICLES: There is an acute to early subacute infarct involving the posterior right basal ganglia and corona radiata. Patchy T2 hyperintensities elsewhere in the cerebral white matter bilaterally have greatly progressed from the remote MRI and are nonspecific but compatible with mild to moderate chronic small vessel ischemic disease. Chronic lacunar infarcts are noted in the thalami and left basal ganglia. No intracranial hemorrhage, mass, midline shift, hydrocephalus, or extra-axial fluid collection is evident. Mild cerebral atrophy is within normal limits for age. Major intracranial vascular flow voids are preserved. ORBITS: No acute abnormality. SINUSES AND MASTOIDS: Small bilateral mastoid effusions. Clear paranasal sinuses. BONES AND SOFT TISSUES: Normal marrow signal. No acute soft tissue abnormality. IMPRESSION: 1. Acute to early subacute right basal ganglia infarct. 2. Mild to moderate chronic small vessel ischemic disease. Electronically signed by: Dasie Hamburg MD 05/15/2024 02:58 PM EST RP Workstation: [id number]   DG Chest 1 View Result Date: 05/15/2024 CLINICAL DATA:  Shortness of breath, cough EXAM: CHEST  1 VIEW COMPARISON:  July 23, 2022 FINDINGS: The heart size and mediastinal contours are within normal limits. Both lungs are clear. The visualized skeletal structures are unremarkable. IMPRESSION: No active disease. Electronically Signed   By: Lynwood Landy Raddle M.D.   On: 05/15/2024 13:25   CT HEAD WO CONTRAST Result Date: 05/15/2024 EXAM: CT HEAD WITHOUT CONTRAST 05/15/2024 12:30:01 PM TECHNIQUE: CT of the head was performed without the administration of intravenous contrast. Automated exposure control,  iterative reconstruction, and/or weight based adjustment of the mA/kV was utilized to reduce the radiation dose to as low as reasonably achievable. COMPARISON: CT of the head dated 01/08/2021. CLINICAL HISTORY: Neuro deficit, acute, stroke suspected. FINDINGS: BRAIN AND VENTRICLES: No acute hemorrhage. No evidence of acute infarct. Patchy supratentorial white matter hypodensity, likely sequela of mild chronic small-vessel ischemic change. Small remote lacunar infarcts in right external capsule and left basal ganglia. No hydrocephalus. No extra-axial collection. No mass effect or midline shift. ORBITS: No acute abnormality. SINUSES: No acute abnormality. SOFT TISSUES AND SKULL: No acute soft tissue abnormality. No skull fracture. VASCULATURE: Bilateral carotid siphon calcification. IMPRESSION: 1. No acute intracranial abnormality. 2. Patchy supratentorial white matter hypodensity, likely sequela of mild chronic small-vessel ischemic change. 3. Small remote lacunar infarcts in right external capsule and left basal ganglia. Electronically signed by: Timothy Berrigan MD 05/15/2024 12:55 PM EST RP Workstation: [id number]        Scheduled Meds:  amLODipine   2.5 mg Oral Daily   aspirin  EC  81 mg Oral Daily   cholecalciferol   5,000 Units Oral Daily   clopidogrel   75 mg Oral Daily   enoxaparin  (LOVENOX ) injection  40 mg Subcutaneous Q24H   ezetimibe   10 mg Oral Daily   fluticasone   1 spray Each Nare Daily   ipratropium-albuterol   3 mL Nebulization TID   levothyroxine   75 mcg Oral Q0600   pantoprazole   40 mg Oral Daily   sodium chloride  flush  3 mL Intravenous Once   Continuous Infusions:   LOS: 0 days     Calvin KATHEE Robson, MD [facility]   If 7PM-7AM, please contact night-coverage  05/17/2024, 11:29 AM

## 2024-05-17 NOTE — Discharge Summary (Signed)
 Physician Discharge Summary  Tiffany Orozco DOB: 1950/06/01 DOA: 05/15/2024  PCP: Avelina Greig BRAVO, MD  Admit date: 05/15/2024 Discharge date: 05/17/2024  Admitted From: Home Disposition:  Home  Recommendations for Outpatient Follow-up:  Follow up with PCP in 1-2 weeks Follow-up with stroke neurology Ambulatory referral to pulmonology  Home Health: No Equipment/Devices: None  Discharge Condition: Stable CODE STATUS: Full Diet recommendation: Heart healthy  Brief/Interim Summary: 73 y.o. female with medical history significant of previous CVA maintained on Aggrenox , hypertension, hyperlipidemia, hypothyroidism, GERD who presents to the ED with approximately a 4 to 5-day history of left-sided facial droop associated with slightly deep left-sided grip strength and numbness on the right side of the face.  Family member also noted to be drooling.  Patient endorses compliance with her home Aggrenox .  She was last seen by Dr. Rosemarie stroke neurology in [place] in 2016 and has been on Aggrenox  since that time.   On my evaluation the patient is resting in bed.  She appears uncomfortable.  She says her main complaint is difficulty breathing.  Her lungs are clear and she remains on room air.  All other vital signs stable.   MRI completed by EDP demonstrates acute to subacute right basal ganglia infarct    Discharge Diagnoses:  Principal Problem:   Acute CVA (cerebrovascular accident) (HCC)  Acute CVA Severe intracranial atherosclerosis Imaging consistent with acute to subacute right basal ganglia infarct Echocardiogram reassuring CTA with significant intracranial atherosclerosis.  Basilar artery near total stenosis, left ICA significant stenosis, left vertebral artery significant stenosis. Plan: Lengthy conversation with patient time of discharge.  Discontinue Aggrenox .  Start aspirin  and Plavix  indefinitely.  Continue antihyperlipidemic's. Follow-up outpatient stroke  neurology  Essential hypertension Resume home amlodipine    Hyperlipidemia Resume home Zetia     Hypothyroidism Resume Synthroid    GERD PPI   Discharge Instructions  Discharge Instructions     Ambulatory referral to Neurology   Complete by: As directed    An appointment is requested in approximately: 6 wks   Increase activity slowly   Complete by: As directed    Pulmonary Visit   Complete by: As directed    COPD/asthma, possible OSA suspected.  Referral to establish care, consideration for spirometry and polysomnography   Reason for referral: Other Pulmonary      Allergies as of 05/17/2024       Reactions   Lisinopril  Shortness Of Breath   Niacin Other (See Comments)    dizziness   Welchol  [colesevelam  Hcl] Diarrhea, Nausea Only, Swelling, Other (See Comments)    swelling in face   Augmentin  [amoxicillin -pot Clavulanate] Nausea Only   GI upset with taking.   Oxycodone Hcl    Patient states this medication makes her hyper        Medication List     STOP taking these medications    dipyridamole -aspirin  200-25 MG 12hr capsule Commonly known as: AGGRENOX    QC TUMERIC COMPLEX PO       TAKE these medications    albuterol  108 (90 Base) MCG/ACT inhaler Commonly known as: VENTOLIN  HFA Inhale 2 puffs into the lungs every 6 (six) hours as needed for wheezing or shortness of breath.   amLODipine  5 MG tablet Commonly known as: NORVASC  Take 0.5 tablets (2.5 mg total) by mouth daily.   aspirin  EC 81 MG tablet Take 1 tablet (81 mg total) by mouth daily. Swallow whole. Start taking on: May 18, 2024   BOSWELLIA PO Take 1 capsule by mouth daily.  clopidogrel  75 MG tablet Commonly known as: PLAVIX  Take 1 tablet (75 mg total) by mouth daily. Start taking on: May 18, 2024   ezetimibe  10 MG tablet Commonly known as: Zetia  Take 1 tablet (10 mg total) by mouth daily.   fluticasone  50 MCG/ACT nasal spray Commonly known as: FLONASE  SPRAY 2 SPRAYS  INTO EACH NOSTRIL EVERY DAY   levothyroxine  75 MCG tablet Commonly known as: SYNTHROID  TAKE 1 TABLET BY MOUTH EVERY DAY   pantoprazole  40 MG tablet Commonly known as: Protonix  Take 1 tablet (40 mg total) by mouth daily.   Vitamin D3 125 MCG (5000 UT) Tabs Take 5,000 Units by mouth daily.        Allergies[1]  Consultations: Neurology   Procedures/Studies: CT ANGIO HEAD NECK W WO CM Result Date: 05/16/2024 EXAM: CT HEAD WITHOUT CTA HEAD AND NECK WITH AND WITHOUT 05/16/2024 04:53:02 PM TECHNIQUE: CTA of the head and neck was performed with and without the administration of intravenous contrast. Noncontrast CT of the head with reconstructed 2-D images are also provided for review. Multiplanar 2D and/or 3D reformatted images are provided for review. Automated exposure control, iterative reconstruction, and/or weight based adjustment of the mA/kV was utilized to reduce the radiation dose to as low as reasonably achievable. COMPARISON: MR head without contrast 05/15/2024 and CT head without contrast 05/15/2024 CLINICAL HISTORY: Neuro deficit, acute, stroke suspected; subacute R BG stroke on MRI. Left sided weakness. FINDINGS: CT HEAD: BRAIN AND VENTRICLES: The nonhemorrhagic subacute infarct of the posterior right lentiform nucleus is stable. Scattered subcortical white matter hypoattenuation bilaterally is stable, mildly advanced for age. No acute intracranial hemorrhage. No mass effect or midline shift. No extra-axial fluid collection. Gray-white differentiation is maintained. No hydrocephalus. ORBITS: No acute abnormality. SINUSES: No acute abnormality. SOFT TISSUES AND SKULL: No acute abnormality. CTA NECK: AORTIC ARCH AND ARCH VESSELS: Atherosclerotic calcifications are present at the aortic arch and great vessel origins without focal stenosis or aneurysm. No dissection or arterial injury. No significant stenosis of the brachiocephalic or subclavian arteries. CERVICAL CAROTID ARTERIES: The  proximal atherosclerotic calcifications are present with narrowing of the proximal right ICA to 2.2 mm. This compares with a more normal distal lumen of 4.4 mm, representing a 50% stenosis by NASCET criteria. Calcified and noncalcified plaque are present at the left carotid bifurcation and proximal ICA. Minimal luminal diameter is 1.9 mm. This compares with 4.4 mm more distally, representing a 55% stenosis by NASCET criteria. No dissection or arterial injury. CERVICAL VERTEBRAL ARTERIES: The right vertebral artery is dominant. High grade stenosis is present at the origin of the left vertebral artery. No dissection or arterial injury. VISUALIZED LUNGS AND MEDIASTINUM: Unremarkable. SOFT TISSUES: No acute abnormality. BONES: Reversal of the normal cervical lordosis is present. Grade 1 degenerative anterolisthesis is present at C3-C4 and C4-C5. Chronic endplate changes are most evident at C5-C6 and C6-C7. No acute abnormality. CTA HEAD: ANTERIOR CIRCULATION: Atherosclerotic calcifications are present within the cavernous internal carotid arteries bilaterally without significant stenosis relative to the ICA terminus. No significant stenosis of the anterior cerebral arteries. No significant stenosis of the middle cerebral arteries. No aneurysm. POSTERIOR CIRCULATION: A high grade, near occlusive, stenosis is present in the proximal basilar artery just distal to the basilar junction. A fetal type right posterior cerebral artery is present. A prominent left posterior communicating artery contributes to the posterior cerebral artery. No significant stenosis of the posterior cerebral arteries. No significant stenosis of the vertebral arteries. No aneurysm. OTHER: No dural venous sinus thrombosis  on this non-dedicated study. IMPRESSION: 1. Stable nonhemorrhagic subacute infarct of the posterior right lentiform nucleus. 2. High grade, near occlusive stenosis in the proximal basilar artery just distal to the basilar junction.  3. High grade stenosis at the origin of the left vertebral artery. 4. Proximal right ICA stenosis of approximately 50% due to atherosclerotic calcifications. 5. Left carotid bifurcation and proximal ICA stenosis of approximately 55% due to calcified and noncalcified plaque. Electronically signed by: Lonni Necessary MD 05/16/2024 05:23 PM EST RP Workstation: [id number]   ECHOCARDIOGRAM COMPLETE Result Date: 05/16/2024    ECHOCARDIOGRAM REPORT   Patient Name:   IYONA PEHRSON Date of Exam: 05/16/2024 Medical Rec #:  [id number]        Height:       58.0 in Accession #:    [id number]       Weight:       175.0 lb Date of Birth:  09/18/1950        BSA:          1.721 m Patient Age:    73 years         BP:           129/69 mmHg Patient Gender: F                HR:           80 bpm. Exam Location:  [facility] Procedure: 2D Echo, Cardiac Doppler and Color Doppler (Both Spectral and Color            Flow Doppler were utilized during procedure). Indications:     Stroke I63.9  History:         Patient has no prior history of Echocardiogram examinations.                  Stroke.  Sonographer:     Rosina Dunk Referring Phys:  [id number] CALVIN KATHEE ROBSON Diagnosing Phys: Evalene Lunger MD IMPRESSIONS  1. Left ventricular ejection fraction, by estimation, is 60 to 65%. The left ventricle has normal function. The left ventricle has no regional wall motion abnormalities. Left ventricular diastolic parameters are consistent with Grade I diastolic dysfunction (impaired relaxation).  2. Right ventricular systolic function is normal. The right ventricular size is normal. Tricuspid regurgitation signal is inadequate for assessing PA pressure.  3. The mitral valve is normal in structure. No evidence of mitral valve regurgitation. No evidence of mitral stenosis.  4. The aortic valve is normal in structure. Aortic valve regurgitation is not visualized. No aortic stenosis is present.  5. The inferior vena cava is normal in size  with greater than 50% respiratory variability, suggesting right atrial pressure of 3 mmHg. FINDINGS  Left Ventricle: Left ventricular ejection fraction, by estimation, is 60 to 65%. The left ventricle has normal function. The left ventricle has no regional wall motion abnormalities. Strain was performed and the global longitudinal strain is indeterminate. The left ventricular internal cavity size was normal in size. There is no left ventricular hypertrophy. Left ventricular diastolic parameters are consistent with Grade I diastolic dysfunction (impaired relaxation). Right Ventricle: The right ventricular size is normal. No increase in right ventricular wall thickness. Right ventricular systolic function is normal. Tricuspid regurgitation signal is inadequate for assessing PA pressure. Left Atrium: Left atrial size was normal in size. Right Atrium: Right atrial size was normal in size. Pericardium: There is no evidence of pericardial effusion. Mitral Valve: The mitral valve is normal in structure. No evidence of mitral valve  regurgitation. No evidence of mitral valve stenosis. MV peak gradient, 4.3 mmHg. The mean mitral valve gradient is 2.0 mmHg. Tricuspid Valve: The tricuspid valve is normal in structure. Tricuspid valve regurgitation is not demonstrated. No evidence of tricuspid stenosis. Aortic Valve: The aortic valve is normal in structure. Aortic valve regurgitation is not visualized. No aortic stenosis is present. Aortic valve mean gradient measures 10.0 mmHg. Aortic valve peak gradient measures 19.6 mmHg. Aortic valve area, by VTI measures 1.81 cm. Pulmonic Valve: The pulmonic valve was normal in structure. Pulmonic valve regurgitation is not visualized. No evidence of pulmonic stenosis. Aorta: The aortic root is normal in size and structure. Venous: The inferior vena cava is normal in size with greater than 50% respiratory variability, suggesting right atrial pressure of 3 mmHg. IAS/Shunts: No atrial level  shunt detected by color flow Doppler. Additional Comments: 3D was performed not requiring image post processing on an independent workstation and was indeterminate.  LEFT VENTRICLE PLAX 2D LVIDd:         4.00 cm     Diastology LVIDs:         1.80 cm     LV e' medial:    5.00 cm/s LV PW:         1.20 cm     LV E/e' medial:  12.9 LV IVS:        1.00 cm     LV e' lateral:   6.20 cm/s LVOT diam:     2.00 cm     LV E/e' lateral: 10.4 LV SV:         76 LV SV Index:   44 LVOT Area:     3.14 cm  LV Volumes (MOD) LV vol d, MOD A2C: 36.9 ml LV vol d, MOD A4C: 43.6 ml LV vol s, MOD A2C: 15.3 ml LV vol s, MOD A4C: 8.7 ml LV SV MOD A2C:     21.6 ml LV SV MOD A4C:     43.6 ml LV SV MOD BP:      29.6 ml RIGHT VENTRICLE             IVC RV Basal diam:  3.20 cm     IVC diam: 1.60 cm RV Mid diam:    2.20 cm RV S prime:     12.60 cm/s  PULMONARY VEINS TAPSE (M-mode): 1.6 cm      A Reversal Duration: 137.00 msec                             A Reversal Velocity: 31.90 cm/s                             Diastolic Velocity:  29.90 cm/s                             S/D Velocity:        2.10                             Systolic Velocity:   62.30 cm/s LEFT ATRIUM             Index        RIGHT ATRIUM          Index LA diam:        3.70 cm 2.15  cm/m   RA Area:     9.58 cm LA Vol (A2C):   34.0 ml 19.76 ml/m  RA Volume:   17.30 ml 10.05 ml/m LA Vol (A4C):   20.7 ml 12.03 ml/m LA Biplane Vol: 27.0 ml 15.69 ml/m  AORTIC VALVE                     PULMONIC VALVE AV Area (Vmax):    1.73 cm      PV Vmax:        1.13 m/s AV Area (Vmean):   1.73 cm      PV Vmean:       75.600 cm/s AV Area (VTI):     1.81 cm      PV VTI:         0.222 m AV Vmax:           221.33 cm/s   PV Peak grad:   5.1 mmHg AV Vmean:          147.667 cm/s  PV Mean grad:   3.0 mmHg AV VTI:            0.420 m       RVOT Peak grad: 3 mmHg AV Peak Grad:      19.6 mmHg AV Mean Grad:      10.0 mmHg LVOT Vmax:         122.00 cm/s LVOT Vmean:        81.200 cm/s LVOT VTI:          0.242  m LVOT/AV VTI ratio: 0.58  AORTA Ao Root diam: 2.50 cm Ao Asc diam:  2.30 cm MITRAL VALVE MV Area (PHT): 3.12 cm    SHUNTS MV Area VTI:   3.06 cm    Systemic VTI:  0.24 m MV Peak grad:  4.3 mmHg    Systemic Diam: 2.00 cm MV Mean grad:  2.0 mmHg    Pulmonic VTI:  0.189 m MV Vmax:       1.04 m/s MV Vmean:      70.0 cm/s MV Decel Time: 243 msec MV E velocity: 64.50 cm/s MV A velocity: 99.60 cm/s MV E/A ratio:  0.65 Evalene Lunger MD Electronically signed by Evalene Lunger MD Signature Date/Time: 05/16/2024/3:34:38 PM    Final    CT ABDOMEN PELVIS W CONTRAST Result Date: 05/15/2024 CLINICAL DATA:  Lower abdominal pain, diarrhea EXAM: CT ABDOMEN AND PELVIS WITH CONTRAST TECHNIQUE: Multidetector CT imaging of the abdomen and pelvis was performed using the standard protocol following bolus administration of intravenous contrast. RADIATION DOSE REDUCTION: This exam was performed according to the departmental dose-optimization program which includes automated exposure control, adjustment of the mA and/or kV according to patient size and/or use of iterative reconstruction technique. CONTRAST:  OMNIPAQUE  IOHEXOL  300 MG/ML  SOLN COMPARISON:  February 26, 2022 FINDINGS: Lower chest: No acute abnormality. Hepatobiliary: No focal liver abnormality is seen. Status post cholecystectomy. No biliary dilatation. Pancreas: Unremarkable. No pancreatic ductal dilatation or surrounding inflammatory changes. Spleen: Normal in size without focal abnormality. Adrenals/Urinary Tract: Adrenal glands are unremarkable. Kidneys are normal, without renal calculi, focal lesion, or hydronephrosis. Bladder is unremarkable. Stomach/Bowel: Status post appendectomy. No definite evidence of bowel obstruction or inflammation. Vascular/Lymphatic: Aortic atherosclerosis. No enlarged abdominal or pelvic lymph nodes. Reproductive: Uterus and bilateral adnexa are unremarkable. Other: No abdominal wall hernia or abnormality. No abdominopelvic  ascites. Musculoskeletal: No acute or significant osseous findings. IMPRESSION: 1. No acute abnormality seen in the abdomen  or pelvis. 2. Aortic atherosclerosis. Aortic Atherosclerosis (ICD10-I70.0). Electronically Signed   By: Lynwood Landy Raddle M.D.   On: 05/15/2024 15:33   MR BRAIN WO CONTRAST Result Date: 05/15/2024 EXAM: MRI BRAIN WITHOUT CONTRAST 05/15/2024 02:27:33 PM TECHNIQUE: Multiplanar multisequence MRI of the head/brain was performed without the administration of intravenous contrast. COMPARISON: Head CT 05/15/2024 and MRI 01/25/2015. CLINICAL HISTORY: Neuro deficit, acute, stroke suspected. Left facial droop. FINDINGS: BRAIN AND VENTRICLES: There is an acute to early subacute infarct involving the posterior right basal ganglia and corona radiata. Patchy T2 hyperintensities elsewhere in the cerebral white matter bilaterally have greatly progressed from the remote MRI and are nonspecific but compatible with mild to moderate chronic small vessel ischemic disease. Chronic lacunar infarcts are noted in the thalami and left basal ganglia. No intracranial hemorrhage, mass, midline shift, hydrocephalus, or extra-axial fluid collection is evident. Mild cerebral atrophy is within normal limits for age. Major intracranial vascular flow voids are preserved. ORBITS: No acute abnormality. SINUSES AND MASTOIDS: Small bilateral mastoid effusions. Clear paranasal sinuses. BONES AND SOFT TISSUES: Normal marrow signal. No acute soft tissue abnormality. IMPRESSION: 1. Acute to early subacute right basal ganglia infarct. 2. Mild to moderate chronic small vessel ischemic disease. Electronically signed by: Dasie Hamburg MD 05/15/2024 02:58 PM EST RP Workstation: [id number]   DG Chest 1 View Result Date: 05/15/2024 CLINICAL DATA:  Shortness of breath, cough EXAM: CHEST  1 VIEW COMPARISON:  July 23, 2022 FINDINGS: The heart size and mediastinal contours are within normal limits. Both lungs are clear. The visualized  skeletal structures are unremarkable. IMPRESSION: No active disease. Electronically Signed   By: Lynwood Landy Raddle M.D.   On: 05/15/2024 13:25   CT HEAD WO CONTRAST Result Date: 05/15/2024 EXAM: CT HEAD WITHOUT CONTRAST 05/15/2024 12:30:01 PM TECHNIQUE: CT of the head was performed without the administration of intravenous contrast. Automated exposure control, iterative reconstruction, and/or weight based adjustment of the mA/kV was utilized to reduce the radiation dose to as low as reasonably achievable. COMPARISON: CT of the head dated 01/08/2021. CLINICAL HISTORY: Neuro deficit, acute, stroke suspected. FINDINGS: BRAIN AND VENTRICLES: No acute hemorrhage. No evidence of acute infarct. Patchy supratentorial white matter hypodensity, likely sequela of mild chronic small-vessel ischemic change. Small remote lacunar infarcts in right external capsule and left basal ganglia. No hydrocephalus. No extra-axial collection. No mass effect or midline shift. ORBITS: No acute abnormality. SINUSES: No acute abnormality. SOFT TISSUES AND SKULL: No acute soft tissue abnormality. No skull fracture. VASCULATURE: Bilateral carotid siphon calcification. IMPRESSION: 1. No acute intracranial abnormality. 2. Patchy supratentorial white matter hypodensity, likely sequela of mild chronic small-vessel ischemic change. 3. Small remote lacunar infarcts in right external capsule and left basal ganglia. Electronically signed by: Evalene Coho MD 05/15/2024 12:55 PM EST RP Workstation: [id number]      Subjective: Seen and examined on the day of discharge.  Stable no distress.  Appropriate discharge home.    Discharge Exam: Vitals:   05/17/24 0354 05/17/24 0755  BP: 123/85 (!) 116/50  Pulse: 80 (!) 58  Resp:  16  Temp: 97.7 F (36.5 C) 98.2 F (36.8 C)  SpO2: 97% 97%   Vitals:   05/16/24 2354 05/17/24 0226 05/17/24 0354 05/17/24 0755  BP: (!) 128/57  123/85 (!) 116/50  Pulse: 64  80 (!) 58  Resp:    16  Temp: 97.7  F (36.5 C)  97.7 F (36.5 C) 98.2 F (36.8 C)  TempSrc: Oral  Oral   SpO2: 96% 95%  97% 97%  Weight:      Height:        General: Pt is alert, awake, not in acute distress Cardiovascular: RRR, S1/S2 +, no rubs, no gallops Respiratory: CTA bilaterally, no wheezing, no rhonchi Abdominal: Soft, NT, ND, bowel sounds + Extremities: no edema, no cyanosis    The results of significant diagnostics from this hospitalization (including imaging, microbiology, ancillary and laboratory) are listed below for reference.     Microbiology: Recent Results (from the past 240 hours)  Resp panel by RT-PCR (RSV, Flu A&B, Covid) Anterior Nasal Swab     Status: None   Collection Time: 05/15/24  1:17 PM   Specimen: Anterior Nasal Swab  Result Value Ref Range Status   SARS Coronavirus 2 by RT PCR NEGATIVE NEGATIVE Final    Comment: (NOTE) SARS-CoV-2 target nucleic acids are NOT DETECTED.  The SARS-CoV-2 RNA is generally detectable in upper respiratory specimens during the acute phase of infection. The lowest concentration of SARS-CoV-2 viral copies this assay can detect is 138 copies/mL. A negative result does not preclude SARS-Cov-2 infection and should not be used as the sole basis for treatment or other patient management decisions. A negative result may occur with  improper specimen collection/handling, submission of specimen other than nasopharyngeal swab, presence of viral mutation(s) within the areas targeted by this assay, and inadequate number of viral copies(<138 copies/mL). A negative result must be combined with clinical observations, patient history, and epidemiological information. The expected result is Negative.  Fact Sheet for Patients:  bloggercourse.com  Fact Sheet for Healthcare Providers:  seriousbroker.it  This test is no t yet approved or cleared by the [place]  FDA and  has been authorized for detection and/or  diagnosis of SARS-CoV-2 by FDA under an Emergency Use Authorization (EUA). This EUA will remain  in effect (meaning this test can be used) for the duration of the COVID-19 declaration under Section 564(b)(1) of the Act, 21 U.S.C.section 360bbb-3(b)(1), unless the authorization is terminated  or revoked sooner.       Influenza A by PCR NEGATIVE NEGATIVE Final   Influenza B by PCR NEGATIVE NEGATIVE Final    Comment: (NOTE) The Xpert Xpress SARS-CoV-2/FLU/RSV plus assay is intended as an aid in the diagnosis of influenza from Nasopharyngeal swab specimens and should not be used as a sole basis for treatment. Nasal washings and aspirates are unacceptable for Xpert Xpress SARS-CoV-2/FLU/RSV testing.  Fact Sheet for Patients: bloggercourse.com  Fact Sheet for Healthcare Providers: seriousbroker.it  This test is not yet approved or cleared by the [place]  FDA and has been authorized for detection and/or diagnosis of SARS-CoV-2 by FDA under an Emergency Use Authorization (EUA). This EUA will remain in effect (meaning this test can be used) for the duration of the COVID-19 declaration under Section 564(b)(1) of the Act, 21 U.S.C. section 360bbb-3(b)(1), unless the authorization is terminated or revoked.     Resp Syncytial Virus by PCR NEGATIVE NEGATIVE Final    Comment: (NOTE) Fact Sheet for Patients: bloggercourse.com  Fact Sheet for Healthcare Providers: seriousbroker.it  This test is not yet approved or cleared by the [place]  FDA and has been authorized for detection and/or diagnosis of SARS-CoV-2 by FDA under an Emergency Use Authorization (EUA). This EUA will remain in effect (meaning this test can be used) for the duration of the COVID-19 declaration under Section 564(b)(1) of the Act, 21 U.S.C. section 360bbb-3(b)(1), unless the authorization is terminated  or revoked.  Performed at [facility], [phone]  [address] [place], [place] [address]      Labs: BNP (last 3 results) No results for input(s): BNP in the last 8760 hours. Basic Metabolic Panel: Recent Labs  Lab 05/15/24 1136  NA 130*  K 3.7  CL 95*  CO2 23  GLUCOSE 108*  BUN 13  CREATININE 0.60  CALCIUM 8.9   Liver Function Tests: Recent Labs  Lab 05/15/24 1136  AST 19  ALT 21  ALKPHOS 89  BILITOT 0.5  PROT 7.2  ALBUMIN 4.3   No results for input(s): LIPASE, AMYLASE in the last 168 hours. No results for input(s): AMMONIA in the last 168 hours. CBC: Recent Labs  Lab 05/15/24 1136  WBC 6.1  NEUTROABS 3.5  HGB 15.2*  HCT 45.5  MCV 91.9  PLT 309   Cardiac Enzymes: No results for input(s): CKTOTAL, CKMB, CKMBINDEX, TROPONINI in the last 168 hours. BNP: Invalid input(s): POCBNP CBG: Recent Labs  Lab 05/15/24 1129 05/16/24 2140 05/17/24 0755  GLUCAP 108* 116* 101*   D-Dimer No results for input(s): DDIMER in the last 72 hours. Hgb A1c Recent Labs    05/15/24 1134  HGBA1C 5.6   Lipid Profile Recent Labs    05/16/24 0508  CHOL 128  HDL 37*  LDLCALC 69  TRIG 888  CHOLHDL 3.5   Thyroid  function studies No results for input(s): TSH, T4TOTAL, T3FREE, THYROIDAB in the last 72 hours.  Invalid input(s): FREET3 Anemia work up No results for input(s): VITAMINB12, FOLATE, FERRITIN, TIBC, IRON, RETICCTPCT in the last 72 hours. Urinalysis    Component Value Date/Time   COLORURINE YELLOW (A) 02/28/2022 1857   APPEARANCEUR HAZY (A) 02/28/2022 1857   LABSPEC 1.011 02/28/2022 1857   PHURINE 6.0 02/28/2022 1857   GLUCOSEU NEGATIVE 02/28/2022 1857   HGBUR MODERATE (A) 02/28/2022 1857   BILIRUBINUR Negative 07/23/2022 1104   KETONESUR 20 (A) 02/28/2022 1857   PROTEINUR Positive (A) 07/23/2022 1104   PROTEINUR 100 (A) 02/28/2022 1857   UROBILINOGEN 0.2 07/23/2022 1104   UROBILINOGEN 0.2 01/25/2015  1903   NITRITE Negative 07/23/2022 1104   NITRITE NEGATIVE 02/28/2022 1857   LEUKOCYTESUR Negative 07/23/2022 1104   LEUKOCYTESUR NEGATIVE 02/28/2022 1857   Sepsis Labs Recent Labs  Lab 05/15/24 1136  WBC 6.1   Microbiology Recent Results (from the past 240 hours)  Resp panel by RT-PCR (RSV, Flu A&B, Covid) Anterior Nasal Swab     Status: None   Collection Time: 05/15/24  1:17 PM   Specimen: Anterior Nasal Swab  Result Value Ref Range Status   SARS Coronavirus 2 by RT PCR NEGATIVE NEGATIVE Final    Comment: (NOTE) SARS-CoV-2 target nucleic acids are NOT DETECTED.  The SARS-CoV-2 RNA is generally detectable in upper respiratory specimens during the acute phase of infection. The lowest concentration of SARS-CoV-2 viral copies this assay can detect is 138 copies/mL. A negative result does not preclude SARS-Cov-2 infection and should not be used as the sole basis for treatment or other patient management decisions. A negative result may occur with  improper specimen collection/handling, submission of specimen other than nasopharyngeal swab, presence of viral mutation(s) within the areas targeted by this assay, and inadequate number of viral copies(<138 copies/mL). A negative result must be combined with clinical observations, patient history, and epidemiological information. The expected result is Negative.  Fact Sheet for Patients:  bloggercourse.com  Fact Sheet for Healthcare Providers:  seriousbroker.it  This test is no t yet approved or cleared by the [place]  FDA and  has  been authorized for detection and/or diagnosis of SARS-CoV-2 by FDA under an Emergency Use Authorization (EUA). This EUA will remain  in effect (meaning this test can be used) for the duration of the COVID-19 declaration under Section 564(b)(1) of the Act, 21 U.S.C.section 360bbb-3(b)(1), unless the authorization is terminated  or revoked sooner.        Influenza A by PCR NEGATIVE NEGATIVE Final   Influenza B by PCR NEGATIVE NEGATIVE Final    Comment: (NOTE) The Xpert Xpress SARS-CoV-2/FLU/RSV plus assay is intended as an aid in the diagnosis of influenza from Nasopharyngeal swab specimens and should not be used as a sole basis for treatment. Nasal washings and aspirates are unacceptable for Xpert Xpress SARS-CoV-2/FLU/RSV testing.  Fact Sheet for Patients: bloggercourse.com  Fact Sheet for Healthcare Providers: seriousbroker.it  This test is not yet approved or cleared by the [place]  FDA and has been authorized for detection and/or diagnosis of SARS-CoV-2 by FDA under an Emergency Use Authorization (EUA). This EUA will remain in effect (meaning this test can be used) for the duration of the COVID-19 declaration under Section 564(b)(1) of the Act, 21 U.S.C. section 360bbb-3(b)(1), unless the authorization is terminated or revoked.     Resp Syncytial Virus by PCR NEGATIVE NEGATIVE Final    Comment: (NOTE) Fact Sheet for Patients: bloggercourse.com  Fact Sheet for Healthcare Providers: seriousbroker.it  This test is not yet approved or cleared by the [place]  FDA and has been authorized for detection and/or diagnosis of SARS-CoV-2 by FDA under an Emergency Use Authorization (EUA). This EUA will remain in effect (meaning this test can be used) for the duration of the COVID-19 declaration under Section 564(b)(1) of the Act, 21 U.S.C. section 360bbb-3(b)(1), unless the authorization is terminated or revoked.  Performed at [facility], [address] [place], [place] [address]      Time coordinating discharge: 40 min   SIGNED:   Calvin KATHEE Robson, MD  [facility] 05/17/2024, 11:27 AM Pager   If 7PM-7AM, please contact night-coverage     [1]  Allergies Allergen Reactions    Lisinopril  Shortness Of Breath   Niacin Other (See Comments)     dizziness   Welchol  [Colesevelam  Hcl] Diarrhea, Nausea Only, Swelling and Other (See Comments)     swelling in face   Augmentin  [Amoxicillin -Pot Clavulanate] Nausea Only    GI upset with taking.   Oxycodone Hcl     Patient states this medication makes her hyper

## 2024-05-17 NOTE — Consult Note (Incomplete)
 NEUROLOGY CONSULT NOTE   Date of service: May 17, 2024 Patient Name: Tiffany Orozco MRN:  [id number] DOB:  [birth year] Chief Complaint: *** Requesting Provider: Jhonny Calvin NOVAK, MD  History of Present Illness  Tiffany Orozco is a 73 y.o. female with hx of ***  LKW: *** Modified rankin score: {Modified Rankin Scale:21264} IV Thrombolysis: ***Yes, *** No (reason) EVT: ***Yes, *** No (reason) ICH Score:***  NIHSS components Score: Comment  1a Level of Conscious 0[]  1[]  2[]  3[]      1b LOC Questions 0[]  1[]  2[]       1c LOC Commands 0[]  1[]  2[]       2 Best Gaze 0[]  1[]  2[]       3 Visual 0[]  1[]  2[]  3[]      4 Facial Palsy 0[]  1[]  2[]  3[]      5a Motor Arm - left 0[]  1[]  2[]  3[]  4[]  UN[]    5b Motor Arm - Right 0[]  1[]  2[]  3[]  4[]  UN[]    6a Motor Leg - Left 0[]  1[]  2[]  3[]  4[]  UN[]    6b Motor Leg - Right 0[]  1[]  2[]  3[]  4[]  UN[]    7 Limb Ataxia 0[]  1[]  2[]  UN[]      8 Sensory 0[]  1[]  2[]  UN[]      9 Best Language 0[]  1[]  2[]  3[]      10 Dysarthria 0[]  1[]  2[]  UN[]      11 Extinct. and Inattention 0[]  1[]  2[]       TOTAL:       ROS  ***Comprehensive ROS performed and pertinent positives documented in HPI  ***Unable to ascertain due to ***  Past History   Past Medical History:  Diagnosis Date   Allergy    Anxiety    COPD (chronic obstructive pulmonary disease) (HCC)    DDD (degenerative disc disease), lumbar    mild   Depression    Female rectocele without uterine prolapse    GERD (gastroesophageal reflux disease)    Hearing loss    Heart murmur    Hyperlipidemia    Hypertension    Hypothyroidism    IBS (irritable bowel syndrome)    Influenza A 06/2015   Intractable nausea and vomiting 03/01/2022   Obesity    Osteopenia    Plantar fasciitis    Pneumonia    Skin cancer of arm    Skin cancer of face    Stroke (HCC) 07/2010   no permenant deficits   Thrombocytopenia    Tubal pregnancy     Past Surgical History:  Procedure Laterality Date    APPENDECTOMY     CHOLECYSTECTOMY  2004   COLONOSCOPY     COLONOSCOPY WITH PROPOFOL  N/A 10/21/2022   Procedure: COLONOSCOPY WITH PROPOFOL ;  Surgeon: Onita Elspeth Sharper, DO;  Location[facility] ENDOSCOPY;  Service: Gastroenterology;  Laterality: N/A;   CYST EXCISION Right    breast mole   EMGs  11/2002   atms and wrists neg   ESOPHAGEAL MANOMETRY N/A 03/17/2015   Procedure: ESOPHAGEAL MANOMETRY (EM);  Surgeon: Elspeth Deward Naval, MD;  Location: [facility] ENDOSCOPY;  Service: Gastroenterology;  Laterality: N/A;   ESOPHAGOGASTRODUODENOSCOPY N/A 03/02/2022   Procedure: ESOPHAGOGASTRODUODENOSCOPY (EGD);  Surgeon: Onita Elspeth Sharper, DO;  Location[facility] ENDOSCOPY;  Service: Gastroenterology;  Laterality: N/A;   ESOPHAGOGASTRODUODENOSCOPY (EGD) WITH PROPOFOL  N/A 05/06/2022   Procedure: ESOPHAGOGASTRODUODENOSCOPY (EGD) WITH PROPOFOL ;  Surgeon: Onita Elspeth Sharper, DO;  Location: [facility] ENDOSCOPY;  Service: Gastroenterology;  Laterality: N/A;   FOOT SURGERY Right 2008, 2009, 2010   torn ligament, hammertoes   HERNIA  REPAIR     TUBAL LIGATION  1975   UPPER GI ENDOSCOPY     XI ROBOTIC ASSISTED VENTRAL HERNIA N/A 04/14/2020   Procedure: XI ROBOTIC ASSISTED VENTRAL HERNIA;  Surgeon: Marolyn Nest, MD;  Location: [facility] ORS;  Service: General;  Laterality: N/A;    Family History: Family History  Problem Relation Age of Onset   Skin cancer Father    Hypertension Mother    Lung cancer Brother    Colon cancer Sister    Esophageal cancer Sister    Ovarian cancer Sister    Stomach cancer Neg Hx    Pancreatic cancer Neg Hx     Social History  reports that she has been smoking cigarettes. She has a 23.3 pack-year smoking history. She has never used smokeless tobacco. She reports that she does not drink alcohol and does not use drugs.  Allergies[1]  Medications  Current Medications[2]  Vitals   Vitals:   05/16/24 1954 05/16/24 2354 05/17/24 0226 05/17/24 0354  BP: (!) 108/52 (!) 128/57   123/85  Pulse: 77 64  80  Resp:      Temp: 98.2 F (36.8 C) 97.7 F (36.5 C)  97.7 F (36.5 C)  TempSrc: Oral Oral  Oral  SpO2: 97% 96% 95% 97%  Weight:      Height:        Body mass index is 36.58 kg/m.   Physical Exam   Constitutional: Appears well-developed and well-nourished. *** Psych: Affect appropriate to situation. *** Eyes: No scleral injection. *** HENT: No OP obstruction. *** Head: Normocephalic. *** Cardiovascular: Normal rate and regular rhythm. *** Respiratory: Effort normal, non-labored breathing. *** GI: Soft.  No distension. There is no tenderness. *** Skin: WDI. ***  Neurologic Examination   ***  Labs/Imaging/Neurodiagnostic studies   CBC:  Recent Labs  Lab 2024/06/09 1136  WBC 6.1  NEUTROABS 3.5  HGB 15.2*  HCT 45.5  MCV 91.9  PLT 309   Basic Metabolic Panel:  Lab Results  Component Value Date   NA 130 (L) 06-09-24   K 3.7 06-09-24   CO2 23 06-09-2024   GLUCOSE 108 (H) 2024-06-09   BUN 13 06/09/2024   CREATININE 0.60 2024-06-09   CALCIUM 8.9 06/09/2024   GFRNONAA >60 June 09, 2024   GFRAA >60 06/16/2015   Lipid Panel:  Lab Results  Component Value Date   LDLCALC 69 05/16/2024   HgbA1c:  Lab Results  Component Value Date   HGBA1C 5.6 2024-06-09   Urine Drug Screen: No results found for: LABOPIA, COCAINSCRNUR, LABBENZ, AMPHETMU, THCU, LABBARB  Alcohol Level     Component Value Date/Time   [facility] <15 2024/06/09 1136   INR  Lab Results  Component Value Date   INR 0.9 09-Jun-2024   APTT  Lab Results  Component Value Date   APTT 32 06-09-2024   AED levels: No results found for: PHENYTOIN, ZONISAMIDE, LAMOTRIGINE, LEVETIRACETA  CT Head without contrast(Personally reviewed): ***  CT angio Head and Neck with contrast(Personally reviewed): 1. Stable nonhemorrhagic subacute infarct of the posterior right lentiform nucleus. 2. High grade, near occlusive stenosis in the proximal basilar artery  just distal to the basilar junction. 3. High grade stenosis at the origin of the left vertebral artery. 4. Proximal right ICA stenosis of approximately 50% due to atherosclerotic calcifications. 5. Left carotid bifurcation and proximal ICA stenosis of approximately 55% due to calcified and noncalcified plaque.  MR Angio head without contrast and Carotid Duplex BL(Personally reviewed): ***  MRI Brain(Personally reviewed): ***  Neurodiagnostics rEEG:  ***  TTE 1. Left ventricular ejection fraction, by estimation, is 60 to 65% . The left ventricle has normal function. The left ventricle has no regional wall motion abnormalities. Left ventricular diastolic parameters are consistent with Grade I diastolic dysfunction ( impaired relaxation) . 2. Right ventricular systolic function is normal. The right ventricular size is normal. Tricuspid regurgitation signal is inadequate for assessing PA pressure. 3. The mitral valve is normal in structure. No evidence of mitral valve regurgitation. No evidence of mitral stenosis. 4. The aortic valve is normal in structure. Aortic valve regurgitation is not visualized. No aortic stenosis is present. 5. The inferior vena cava is normal in size with greater than 50% respiratory variability, suggesting right atrial pressure of 3 mmHg.  Stroke Labs     Component Value Date/Time   CHOL 128 05/16/2024 0508   TRIG 111 05/16/2024 0508   HDL 37 (L) 05/16/2024 0508   CHOLHDL 3.5 05/16/2024 0508   VLDL 22 05/16/2024 0508   LDLCALC 69 05/16/2024 0508   LDLDIRECT 103.0 09/02/2016 1156    Lab Results  Component Value Date/Time   HGBA1C 5.6 05/15/2024 11:34 AM     ASSESSMENT   Tiffany Orozco is a 73 y.o. female ***  Etiology of stroke is small vessel ischemic disease  RECOMMENDATIONS   - Stroke workup completed - Continue DAPT with ASA 81mg  daily + plavix  75mg  daily indefinitely 2/2 severe cerebrovascular disease including near-occlusive proximal  basilar stenosis. Stop home aspirin -dipyridamole  - Continue home zetia , no indication for addition of a statin bc LDL = 69 is at goal without - I will refer to outpatient neurology f/u  Neurology to s/o, please re-engage if additional neurologic concerns arise. ______________________________________________________________________    Signed, Elida CHRISTELLA Ross, MD [facility]     [1]  Allergies Allergen Reactions   Lisinopril  Shortness Of Breath   Niacin Other (See Comments)     dizziness   Welchol  [Colesevelam  Hcl] Diarrhea, Nausea Only, Swelling and Other (See Comments)     swelling in face   Augmentin  [Amoxicillin -Pot Clavulanate] Nausea Only    GI upset with taking.   Oxycodone Hcl     Patient states this medication makes her hyper  [2]  Current Facility-Administered Medications:    acetaminophen  (TYLENOL ) tablet 650 mg, 650 mg, Oral, Q4H PRN **OR** acetaminophen  (TYLENOL ) 160 MG/5ML solution 650 mg, 650 mg, Per Tube, Q4H PRN **OR** acetaminophen  (TYLENOL ) suppository 650 mg, 650 mg, Rectal, Q4H PRN, Sreenath, Sudheer B, MD   albuterol  (PROVENTIL ) (2.5 MG/3ML) 0.083% nebulizer solution 2.5 mg, 2.5 mg, Inhalation, Q6H PRN, Sreenath, Sudheer B, MD, 2.5 mg at 05/17/24 0226   amLODipine  (NORVASC ) tablet 2.5 mg, 2.5 mg, Oral, Daily, Sreenath, Sudheer B, MD   aspirin  EC tablet 81 mg, 81 mg, Oral, Daily, Ross Elida M, MD, 81 mg at 05/16/24 1718   cholecalciferol  (VITAMIN D3) 25 MCG (1000 UNIT) tablet 5,000 Units, 5,000 Units, Oral, Daily, Sreenath, Sudheer B, MD, 5,000 Units at 05/16/24 1241   clopidogrel  (PLAVIX ) tablet 75 mg, 75 mg, Oral, Daily, Ross Elida M, MD, 75 mg at 05/16/24 1718   enoxaparin  (LOVENOX ) injection 40 mg, 40 mg, Subcutaneous, Q24H, Sreenath, Sudheer B, MD, 40 mg at 05/16/24 2124   ezetimibe  (ZETIA ) tablet 10 mg, 10 mg, Oral, Daily, Sreenath, Sudheer B, MD   fluticasone  (FLONASE ) 50 MCG/ACT nasal spray 1 spray, 1 spray, Each Nare, Daily,  Sreenath, Sudheer B, MD   ipratropium-albuterol  (DUONEB) 0.5-2.5 (3) MG/3ML nebulizer solution 3 mL,  3 mL, Nebulization, TID, Sreenath, Sudheer B, MD   levothyroxine  (SYNTHROID ) tablet 75 mcg, 75 mcg, Oral, Q0600, Sreenath, Sudheer B, MD, 75 mcg at 05/17/24 9446   loperamide  (IMODIUM ) capsule 2 mg, 2 mg, Oral, PRN, Sreenath, Sudheer B, MD, 2 mg at 05/16/24 1717   ondansetron  (ZOFRAN ) injection 4 mg, 4 mg, Intravenous, Q6H PRN, Donati-Garmon, Natalie M, NP, 4 mg at 05/16/24 1718   pantoprazole  (PROTONIX ) EC tablet 40 mg, 40 mg, Oral, Daily, Sreenath, Sudheer B, MD, 40 mg at 05/16/24 1241   senna-docusate (Senokot-S) tablet 1 tablet, 1 tablet, Oral, QHS PRN, Sreenath, Sudheer B, MD   sodium chloride  flush (NS) 0.9 % injection 3 mL, 3 mL, Intravenous, Once, Tan, Lorelle Cummins, MD

## 2024-05-18 ENCOUNTER — Telehealth: Payer: Self-pay | Admitting: Family Medicine

## 2024-05-18 NOTE — Telephone Encounter (Signed)
 Please call and triage.  I don't see that they tested a urine sample for anything so if symptomatic, she will need an appointment somewhere.

## 2024-05-18 NOTE — Telephone Encounter (Signed)
 I spoke with pt; pt said when was at ED on 05/15/24 urine smelled with bad odor, frequency of urine. Pt did not have fever, or pain or burning upon urination. It appears U/A ands urine culture were cancelled at ED. Pt continues with symptoms. No available appts at any [facility] today. No available appts at [facility] UC today. Pt said her daughter has taken her husband to doctor this morning and sometime this afternoon pts daughter will take her to UC.  UC & ED precautions given and pt voiced understanding. Pt will call 911 if condition worsens prior to pt getting to UC. Sending note to Dr Avelina and [place] pool and will teams Arland CMA. Pt said she is able to be up and walking; pt said she had mini stroke; pt d/c from hospital on 05/17/24. Pt said the tingling on rt side is better but still has problem chewing on tongue on and off. Again UC & ED precautions given and pt voiced understanding,

## 2024-05-18 NOTE — Telephone Encounter (Signed)
 Copied from CRM #[id number]. Topic: General - Other >> May 18, 2024  8:29 AM Tiffany Orozco wrote: Reason for CRM: patient would like to be called. She stated she want to let the provider know she had a stroke on Tuesday and came from the hospital yesterday evening. Patient want the provider to look in her chart because  they took a urine sample, but did not tell her if she had a UTI. Patient stated her urine smells and she had diarrhea all week but the hospital gave her something for the diarrhea and it stopped. It was a mild stroke. [phone]

## 2024-05-21 ENCOUNTER — Telehealth: Payer: Self-pay

## 2024-05-21 NOTE — Transitions of Care (Post Inpatient/ED Visit) (Signed)
 "  05/21/2024  Name: Tiffany Orozco MRN: [id number] DOB: 1951/05/30  Today's TOC FU Call Status: Today's TOC FU Call Status:: Successful TOC FU Call Completed TOC FU Call Complete Date: 05/21/24  Patient's Name and Date of Birth confirmed. DOB, Name  Transition Care Management Follow-up Telephone Call Date of Discharge: 05/17/24 Discharge Facility: [facility]) Type of Discharge: Inpatient Admission Primary Inpatient Discharge Diagnosis:: Acute CVA How have you been since you were released from the hospital?: Better Any questions or concerns?: No  Items Reviewed: Did you receive and understand the discharge instructions provided?: Yes Medications obtained,verified, and reconciled?: Yes (Medications Reviewed) Any new allergies since your discharge?: No Dietary orders reviewed?: NA Do you have support at home?: Yes  Medications Reviewed Today: Medications Reviewed Today     Reviewed by Lavelle Charmaine NOVAK, LPN (Licensed Practical Nurse) on 05/21/24 at 1219  Med List Status: <None>   Medication Order Taking? Sig Documenting Provider Last Dose Status Informant  albuterol  (VENTOLIN  HFA) 108 (90 Base) MCG/ACT inhaler [id number]  Inhale 2 puffs into the lungs every 6 (six) hours as needed for wheezing or shortness of breath. Jhonny Calvin NOVAK, MD  Active   amLODipine  (NORVASC ) 5 MG tablet [id number] No Take 0.5 tablets (2.5 mg total) by mouth daily. Avelina Greig BRAVO, MD Past Week Active Self  aspirin  EC 81 MG tablet [id number]  Take 1 tablet (81 mg total) by mouth daily. Swallow whole. Jhonny Calvin NOVAK, MD  Active   Boswellia Serrata (BOSWELLIA PO) [id number] No Take 1 capsule by mouth daily. [provider] Past Week Active Self  Cholecalciferol  (VITAMIN D3) 125 MCG (5000 UT) TABS [id number] No Take 5,000 Units by mouth daily. [provider] Past Week Active Self  clopidogrel  (PLAVIX ) 75 MG tablet [id number]  Take 1 tablet (75 mg total) by  mouth daily. Jhonny Calvin NOVAK, MD  Active   ezetimibe  (ZETIA ) 10 MG tablet [id number] No Take 1 tablet (10 mg total) by mouth daily. Avelina Greig BRAVO, MD Unknown Active Self  fluticasone  (FLONASE ) 50 MCG/ACT nasal spray [id number] No SPRAY 2 SPRAYS INTO EACH NOSTRIL EVERY DAY Bedsole, Amy E, MD Unknown Active Self  levothyroxine  (SYNTHROID ) 75 MCG tablet [id number] No TAKE 1 TABLET BY MOUTH EVERY DAY Bedsole, Amy E, MD 05/15/2024 Morning Active Self  pantoprazole  (PROTONIX ) 40 MG tablet [id number] No Take 1 tablet (40 mg total) by mouth daily. Avelina Greig BRAVO, MD Past Month Active Self            Home Care and Equipment/Supplies: Were Home Health Services Ordered?: No Any new equipment or medical supplies ordered?: NA  Functional Questionnaire: Do you need assistance with bathing/showering or dressing?: No Do you need assistance with meal preparation?: No Do you need assistance with eating?: No Do you have difficulty maintaining continence: No Do you need assistance with getting out of bed/getting out of a chair/moving?: No Do you have difficulty managing or taking your medications?: No  Follow up appointments reviewed: PCP Follow-up appointment confirmed?: Yes Date of PCP follow-up appointment?: 06/01/24 Follow-up Provider: Dr. Avelina Specialist [facility] Follow-up appointment confirmed?: Yes Date of Specialist follow-up appointment?: 06/12/24 Follow-Up Specialty Provider:: Pulmonology Reason Specialist Follow-Up Not Confirmed: Patient has Specialist Provider Number and will Call for Appointment (Neurology) Do you need transportation to your follow-up appointment?: No Do you understand care options if your condition(s) worsen?: Yes-patient verbalized understanding    SIGNATURE Charmaine Lavelle, LPN [facility] Advisor [place] [facility] You Are.  We Are. [facility] Direct Dial [phone]  "

## 2024-05-21 NOTE — Consult Note (Signed)
 NEUROLOGY CONSULT NOTE   Date of service: 05/17/24 Patient Name: Tiffany Orozco MRN:  [id number] DOB:  [birth year] Chief Complaint: acute ischemic stroke Requesting Provider: No att. providers found  History of Present Illness  Tiffany Orozco is a 73 y.o. female with hx of previous CVA now on generic aggrenox , HTN, HL who presents with 4-5 days of L facial droop. She was last seen by neurology in 2016 and started on aggrenox  at that time. Aggrenox  is no longer available in the [place]  and she has been maintained on the generic equivalent. She also has L hand numbness which is chronic.  NIHSS = 2 for L facial droop and sensory deficit    ROS  Comprehensive ROS performed and pertinent positives documented in HPI   Past History   Past Medical History:  Diagnosis Date   Allergy    Anxiety    COPD (chronic obstructive pulmonary disease) (HCC)    DDD (degenerative disc disease), lumbar    mild   Depression    Female rectocele without uterine prolapse    GERD (gastroesophageal reflux disease)    Hearing loss    Heart murmur    Hyperlipidemia    Hypertension    Hypothyroidism    IBS (irritable bowel syndrome)    Influenza A 06/2015   Intractable nausea and vomiting 03/01/2022   Obesity    Osteopenia    Plantar fasciitis    Pneumonia    Skin cancer of arm    Skin cancer of face    Stroke (HCC) 07/2010   no permenant deficits   Thrombocytopenia    Tubal pregnancy     Past Surgical History:  Procedure Laterality Date   APPENDECTOMY     CHOLECYSTECTOMY  2004   COLONOSCOPY     COLONOSCOPY WITH PROPOFOL  N/A 10/21/2022   Procedure: COLONOSCOPY WITH PROPOFOL ;  Surgeon: Onita Elspeth Sharper, DO;  Location: [facility] ENDOSCOPY;  Service: Gastroenterology;  Laterality: N/A;   CYST EXCISION Right    breast mole   EMGs  11/2002   atms and wrists neg   ESOPHAGEAL MANOMETRY N/A 03/17/2015   Procedure: ESOPHAGEAL MANOMETRY (EM);  Surgeon: Elspeth Deward Naval, MD;  Location: [facility]  ENDOSCOPY;  Service: Gastroenterology;  Laterality: N/A;   ESOPHAGOGASTRODUODENOSCOPY N/A 03/02/2022   Procedure: ESOPHAGOGASTRODUODENOSCOPY (EGD);  Surgeon: Onita Elspeth Sharper, DO;  Location: [facility] ENDOSCOPY;  Service: Gastroenterology;  Laterality: N/A;   ESOPHAGOGASTRODUODENOSCOPY (EGD) WITH PROPOFOL  N/A 05/06/2022   Procedure: ESOPHAGOGASTRODUODENOSCOPY (EGD) WITH PROPOFOL ;  Surgeon: Onita Elspeth Sharper, DO;  Location: [facility] ENDOSCOPY;  Service: Gastroenterology;  Laterality: N/A;   FOOT SURGERY Right 2008, 2009, 2010   torn ligament, hammertoes   HERNIA REPAIR     TUBAL LIGATION  1975   UPPER GI ENDOSCOPY     XI ROBOTIC ASSISTED VENTRAL HERNIA N/A 04/14/2020   Procedure: XI ROBOTIC ASSISTED VENTRAL HERNIA;  Surgeon: Marolyn Nest, MD;  Location: [facility] ORS;  Service: General;  Laterality: N/A;    Family History: Family History  Problem Relation Age of Onset   Skin cancer Father    Hypertension Mother    Lung cancer Brother    Colon cancer Sister    Esophageal cancer Sister    Ovarian cancer Sister    Stomach cancer Neg Hx    Pancreatic cancer Neg Hx     Social History  reports that she has been smoking cigarettes. She has a 23.3 pack-year smoking history. She has never used smokeless tobacco. She reports that she does not  drink alcohol and does not use drugs.  Allergies[1]  Medications  Current Medications[2]  Vitals   Vitals:   05/16/24 2354 05/17/24 0226 05/17/24 0354 05/17/24 0755  BP: (!) 128/57  123/85 (!) 116/50  Pulse: 64  80 (!) 58  Resp:    16  Temp: 97.7 F (36.5 C)  97.7 F (36.5 C) 98.2 F (36.8 C)  TempSrc: Oral  Oral   SpO2: 96% 95% 97% 97%  Weight:      Height:        Body mass index is 36.58 kg/m.   Physical Exam   Gen: patient lying in bed, NAD CV: extremities appear well-perfused Resp: normal WOB   Neurologic exam MS: alert, oriented x4, follows commands Speech: no dysarthria, no aphasia CN: PERRL, VFF, EOMI, sensation intact,  L lower facial numbness, hearing intact to voice Motor: 5/5 strength throughout Sensory: SILT except decreased R hand chronic x6 wks Reflexes: 2+ symm with toes down bilat Coordination: FNF intact bilat Gait: deferred  Labs/Imaging/Neurodiagnostic studies   CBC:  Recent Labs  Lab 21-May-2024 1136  WBC 6.1  NEUTROABS 3.5  HGB 15.2*  HCT 45.5  MCV 91.9  PLT 309   Basic Metabolic Panel:  Lab Results  Component Value Date   NA 130 (L) May 21, 2024   K 3.7 05/21/2024   CO2 23 2024-05-21   GLUCOSE 108 (H) 2024/05/21   BUN 13 05/21/2024   CREATININE 0.60 05-21-2024   CALCIUM 8.9 21-May-2024   GFRNONAA >60 May 21, 2024   GFRAA >60 06/16/2015   Lipid Panel:  Lab Results  Component Value Date   LDLCALC 69 05/16/2024   HgbA1c:  Lab Results  Component Value Date   HGBA1C 5.6 21-May-2024   Urine Drug Screen: No results found for: LABOPIA, COCAINSCRNUR, LABBENZ, AMPHETMU, THCU, LABBARB  Alcohol Level     Component Value Date/Time   [facility] <15 2024-05-21 1136   INR  Lab Results  Component Value Date   INR 0.9 05/21/24   APTT  Lab Results  Component Value Date   APTT 32 May 21, 2024   AED levels: No results found for: PHENYTOIN, ZONISAMIDE, LAMOTRIGINE, LEVETIRACETA  Expand All Collapse All   CT angio Head and Neck with contrast(Personally reviewed): 1. Stable nonhemorrhagic subacute infarct of the posterior right lentiform nucleus. 2. High grade, near occlusive stenosis in the proximal basilar artery just distal to the basilar junction. 3. High grade stenosis at the origin of the left vertebral artery. 4. Proximal right ICA stenosis of approximately 50% due to atherosclerotic calcifications. 5. Left carotid bifurcation and proximal ICA stenosis of approximately 55% due to calcified and noncalcified plaque.   MRI Brain(Personally reviewed): 1. Acute to early subacute right basal ganglia infarct. 2. Mild to moderate chronic small vessel ischemic  disease.   TTE 1. Left ventricular ejection fraction, by estimation, is 60 to 65% . The left ventricle has normal function. The left ventricle has no regional wall motion abnormalities. Left ventricular diastolic parameters are consistent with Grade I diastolic dysfunction ( impaired relaxation) . 2. Right ventricular systolic function is normal. The right ventricular size is normal. Tricuspid regurgitation signal is inadequate for assessing PA pressure. 3. The mitral valve is normal in structure. No evidence of mitral valve regurgitation. No evidence of mitral stenosis. 4. The aortic valve is normal in structure. Aortic valve regurgitation is not visualized. No aortic stenosis is present. 5. The inferior vena cava is normal in size with greater than 50% respiratory variability, suggesting right atrial pressure of  3 mmHg.    ASSESSMENT   Andrell K Kontz is a 73 y.o. female with hx of previous CVA now on generic aggrenox , HTN, HL who presents with 4-5 days of L facial droop and was found to have acute ischemic stroke. Etiology of stroke is small vessel disease.  RECOMMENDATIONS   - Stroke workup completed - Continue DAPT with ASA 81mg  daily + plavix  75mg  daily indefinitely 2/2 severe cerebrovascular disease including near-occlusive proximal basilar stenosis. Stop home aspirin -dipyridamole  - Continue home zetia , no indication for addition of a statin bc LDL = 69 is at goal without - I will refer to outpatient neurology f/u   Neurology to s/o, please re-engage if additional neurologic concerns arise. __  ______________________________________________________________________    Bonney Elida CHRISTELLA Matthews, MD [facility]     [1]  Allergies Allergen Reactions   Lisinopril  Shortness Of Breath   Niacin Other (See Comments)     dizziness   Welchol  [Colesevelam  Hcl] Diarrhea, Nausea Only, Swelling and Other (See Comments)     swelling in face   Augmentin  [Amoxicillin -Pot  Clavulanate] Nausea Only    GI upset with taking.   Oxycodone Hcl     Patient states this medication makes her hyper  [2] No current facility-administered medications for this encounter.  Current Outpatient Medications:    amLODipine  (NORVASC ) 5 MG tablet, Take 0.5 tablets (2.5 mg total) by mouth daily., Disp: 90 tablet, Rfl: 3   Boswellia Serrata (BOSWELLIA PO), Take 1 capsule by mouth daily., Disp: , Rfl:    Cholecalciferol  (VITAMIN D3) 125 MCG (5000 UT) TABS, Take 5,000 Units by mouth daily., Disp: , Rfl:    ezetimibe  (ZETIA ) 10 MG tablet, Take 1 tablet (10 mg total) by mouth daily., Disp: 90 tablet, Rfl: 3   fluticasone  (FLONASE ) 50 MCG/ACT nasal spray, SPRAY 2 SPRAYS INTO EACH NOSTRIL EVERY DAY, Disp: 48 mL, Rfl: 0   levothyroxine  (SYNTHROID ) 75 MCG tablet, TAKE 1 TABLET BY MOUTH EVERY DAY, Disp: 90 tablet, Rfl: 3   pantoprazole  (PROTONIX ) 40 MG tablet, Take 1 tablet (40 mg total) by mouth daily., Disp: 90 tablet, Rfl: 1   albuterol  (VENTOLIN  HFA) 108 (90 Base) MCG/ACT inhaler, Inhale 2 puffs into the lungs every 6 (six) hours as needed for wheezing or shortness of breath., Disp: 6.7 g, Rfl: 2   aspirin  EC 81 MG tablet, Take 1 tablet (81 mg total) by mouth daily. Swallow whole., Disp: , Rfl:    clopidogrel  (PLAVIX ) 75 MG tablet, Take 1 tablet (75 mg total) by mouth daily., Disp: 30 tablet, Rfl: 1

## 2024-06-01 ENCOUNTER — Inpatient Hospital Stay: Admitting: Family Medicine

## 2024-06-06 ENCOUNTER — Ambulatory Visit: Admitting: Family Medicine

## 2024-06-06 ENCOUNTER — Encounter: Payer: Self-pay | Admitting: Family Medicine

## 2024-06-06 VITALS — BP 118/78 | HR 82 | Temp 98.0°F | Ht 58.5 in | Wt 173.4 lb

## 2024-06-06 DIAGNOSIS — J439 Emphysema, unspecified: Secondary | ICD-10-CM

## 2024-06-06 DIAGNOSIS — Z8673 Personal history of transient ischemic attack (TIA), and cerebral infarction without residual deficits: Secondary | ICD-10-CM

## 2024-06-06 DIAGNOSIS — Z789 Other specified health status: Secondary | ICD-10-CM

## 2024-06-06 DIAGNOSIS — I10 Essential (primary) hypertension: Secondary | ICD-10-CM

## 2024-06-06 DIAGNOSIS — E7800 Pure hypercholesterolemia, unspecified: Secondary | ICD-10-CM

## 2024-06-06 MED ORDER — CLOPIDOGREL BISULFATE 75 MG PO TABS: 75.0000 mg | ORAL_TABLET | Freq: Every day | ORAL | 3 refills | Status: AC

## 2024-06-06 NOTE — Assessment & Plan Note (Signed)
"   New additional CVA.  Residual symptoms gradually resolving.  Aggrenox  D/C'd and pt changed to Plvix and ASA. Follow up with neurology.  Statin intolerant but LDL at goal< 70 "

## 2024-06-06 NOTE — Assessment & Plan Note (Signed)
 Chronic, well controlled off amlodipine . If BP trending up can restart amlodipine  at 2.5 mg daily given 5 mg caused dizziness.

## 2024-06-06 NOTE — Assessment & Plan Note (Signed)
 Chronic, now back at goal  on  zetia  10 mg daily.Charlesetta of statins... indicated give CAD/CVA history, goal < 70.  Also now on colestipol  for diarrhea but encouraged pt to not stop zetia  as colestipol  not likely to be as effective.

## 2024-06-06 NOTE — Assessment & Plan Note (Addendum)
"   Has appt with pulmonary scheduled.  Using albuterol  as needed. "

## 2024-06-06 NOTE — Progress Notes (Signed)
 "   Patient ID: Tiffany Orozco, female    DOB: 1950-07-27, 74 y.o.   MRN: [id number]  This visit was conducted in person.  BP 118/78   Pulse 82   Temp 98 F (36.7 C) (Oral)   Ht 4' 10.5 (1.486 m)   Wt 173 lb 6 oz (78.6 kg)   SpO2 98%   BMI 35.62 kg/m    CC:  Chief Complaint  Patient presents with   Hospitalization Follow-up    Hosptial f/u -CVA 05-15-24    Subjective:   [name] is a 74 y.o. female presenting on 06/06/2024 for Hospitalization Follow-up (Hosptial f/u -CVA 05-15-24) TOC call 05/21/2024 Admission date: 05/15/2024 Discharge date 05/17/2024  Presented with 4 to 5-day history of left-sided facial droop associated with sleep left-sided grip strength decrease and numbness on right side of face. MRI showed acute to subacute right basal ganglia infarct despite patient on Aggrenox .  Echocardiogram was reassuring.  CTA with significant intracranial atherosclerosis.  Basilar artery near-total stenosis, left ICA significant stenosis, left vertebral artery significant stenosis. Recommended follow-up with neurology.  Discontinued Aggrenox  given ineffective.  Started on aspirin  and Plavix  indefinitely.  COPD/asthma possible obstructive sleep apnea: Referred to pulmonary for consideration of spirometry and polysomnography.  Today she reports residual numbness on right upper lip and right side of nose.  Strength in hands is improving.  No further droop or drooling.  Tolerating ASA and plavix  well.. bruising easily.  No nose bleeds or blood in urine or stool.   Has appt with Neuro in 07/2023   Started on colestipol  for chronic diarrhea.. has had decreased BMs.    05/16/2024 LDL 69 was off zetia  for awhile.. now back  Intolerance to statins in past.   HTN.. dizzy when takes amlodipine .. so has stopped. BP Readings from Last 3 Encounters:  06/06/24 118/78  05/17/24 (!) 116/50  05/01/24 (!) 140/80   Wt Readings from Last 3 Encounters:  06/06/24 173 lb 6  oz (78.6 kg)  05/15/24 175 lb 0.7 oz (79.4 kg)  05/01/24 175 lb 2 oz (79.4 kg)     Relevant past medical, surgical, family and social history reviewed and updated as indicated. Interim medical history since our last visit reviewed. Allergies and medications reviewed and updated. Outpatient Medications Prior to Visit  Medication Sig Dispense Refill   albuterol  (VENTOLIN  HFA) 108 (90 Base) MCG/ACT inhaler Inhale 2 puffs into the lungs every 6 (six) hours as needed for wheezing or shortness of breath. 6.7 g 2   aspirin  EC 81 MG tablet Take 1 tablet (81 mg total) by mouth daily. Swallow whole.     Boswellia Serrata (BOSWELLIA PO) Take 1 capsule by mouth daily.     Cholecalciferol  (VITAMIN D3) 125 MCG (5000 UT) TABS Take 5,000 Units by mouth daily.     ezetimibe  (ZETIA ) 10 MG tablet Take 1 tablet (10 mg total) by mouth daily. 90 tablet 3   fluticasone  (FLONASE ) 50 MCG/ACT nasal spray SPRAY 2 SPRAYS INTO EACH NOSTRIL EVERY DAY 48 mL 0   levothyroxine  (SYNTHROID ) 75 MCG tablet TAKE 1 TABLET BY MOUTH EVERY DAY 90 tablet 3   pantoprazole  (PROTONIX ) 40 MG tablet Take 1 tablet (40 mg total) by mouth daily. 90 tablet 1   clopidogrel  (PLAVIX ) 75 MG tablet Take 1 tablet (75 mg total) by mouth daily. 30 tablet 1   amLODipine  (NORVASC ) 5 MG tablet Take 0.5 tablets (2.5 mg total) by mouth daily. (Patient not taking: Reported on 06/06/2024) 90  tablet 3   No facility-administered medications prior to visit.     Per HPI unless specifically indicated in ROS section below Review of Systems Objective:  BP 118/78   Pulse 82   Temp 98 F (36.7 C) (Oral)   Ht 4' 10.5 (1.486 m)   Wt 173 lb 6 oz (78.6 kg)   SpO2 98%   BMI 35.62 kg/m   Wt Readings from Last 3 Encounters:  06/06/24 173 lb 6 oz (78.6 kg)  05/15/24 175 lb 0.7 oz (79.4 kg)  05/01/24 175 lb 2 oz (79.4 kg)      Physical Exam    Results for orders placed or performed during the hospital encounter of 05/15/24  CBG monitoring, ED   Collection  Time: 05/15/24 11:29 AM  Result Value Ref Range   Glucose-Capillary 108 (H) 70 - 99 mg/dL  Hemoglobin J8r   Collection Time: 05/15/24 11:34 AM  Result Value Ref Range   Hgb A1c MFr Bld 5.6 4.8 - 5.6 %   Mean Plasma Glucose 114.02 mg/dL  Protime-INR   Collection Time: 05/15/24 11:36 AM  Result Value Ref Range   Prothrombin Time 12.4 11.4 - 15.2 seconds   INR 0.9 0.8 - 1.2  APTT   Collection Time: 05/15/24 11:36 AM  Result Value Ref Range   aPTT 32 24 - 36 seconds  CBC   Collection Time: 05/15/24 11:36 AM  Result Value Ref Range   WBC 6.1 4.0 - 10.5 K/uL   RBC 4.95 3.87 - 5.11 MIL/uL   Hemoglobin 15.2 (H) 12.0 - 15.0 g/dL   HCT 54.4 63.9 - 53.9 %   MCV 91.9 80.0 - 100.0 fL   MCH 30.7 26.0 - 34.0 pg   MCHC 33.4 30.0 - 36.0 g/dL   RDW 86.6 88.4 - 84.4 %   Platelets 309 150 - 400 K/uL   nRBC 0.0 0.0 - 0.2 %  Differential   Collection Time: 05/15/24 11:36 AM  Result Value Ref Range   Neutrophils Relative % 56 %   Neutro Abs 3.5 1.7 - 7.7 K/uL   Lymphocytes Relative 33 %   Lymphs Abs 2.0 0.7 - 4.0 K/uL   Monocytes Relative 9 %   Monocytes Absolute 0.5 0.1 - 1.0 K/uL   Eosinophils Relative 1 %   Eosinophils Absolute 0.1 0.0 - 0.5 K/uL   Basophils Relative 1 %   Basophils Absolute 0.0 0.0 - 0.1 K/uL   Immature Granulocytes 0 %   Abs Immature Granulocytes 0.02 0.00 - 0.07 K/uL  Comprehensive metabolic panel   Collection Time: 05/15/24 11:36 AM  Result Value Ref Range   Sodium 130 (L) 135 - 145 mmol/L   Potassium 3.7 3.5 - 5.1 mmol/L   Chloride 95 (L) 98 - 111 mmol/L   CO2 23 22 - 32 mmol/L   Glucose, Bld 108 (H) 70 - 99 mg/dL   BUN 13 8 - 23 mg/dL   Creatinine, Ser 9.39 0.44 - 1.00 mg/dL   Calcium 8.9 8.9 - 89.6 mg/dL   Total Protein 7.2 6.5 - 8.1 g/dL   Albumin 4.3 3.5 - 5.0 g/dL   AST 19 15 - 41 U/L   ALT 21 0 - 44 U/L   Alkaline Phosphatase 89 38 - 126 U/L   Total Bilirubin 0.5 0.0 - 1.2 mg/dL   GFR, Estimated >39 >39 mL/min   Anion gap 12 5 - 15  Ethanol    Collection Time: 05/15/24 11:36 AM  Result Value Ref Range  Alcohol, Ethyl (B) <15 <15 mg/dL  Resp panel by RT-PCR (RSV, Flu A&B, Covid) Anterior Nasal Swab   Collection Time: 05/15/24  1:17 PM   Specimen: Anterior Nasal Swab  Result Value Ref Range   SARS Coronavirus 2 by RT PCR NEGATIVE NEGATIVE   Influenza A by PCR NEGATIVE NEGATIVE   Influenza B by PCR NEGATIVE NEGATIVE   Resp Syncytial Virus by PCR NEGATIVE NEGATIVE  Lipid panel   Collection Time: 05/16/24  5:08 AM  Result Value Ref Range   Cholesterol 128 0 - 200 mg/dL   Triglycerides 888 <849 mg/dL   HDL 37 (L) >59 mg/dL   Total CHOL/HDL Ratio 3.5 RATIO   VLDL 22 0 - 40 mg/dL   LDL Cholesterol 69 0 - 99 mg/dL  ECHOCARDIOGRAM COMPLETE   Collection Time: 05/16/24 12:00 PM  Result Value Ref Range   Weight 2,800.72 oz   Height 58 in   BP 139/82 mmHg   Ao pk vel 2.21 m/s   AV Area VTI 1.81 cm2   AR max vel 1.73 cm2   AV Mean grad 10.0 mmHg   AV Peak grad 19.6 mmHg   Single Plane A2C EF 58.5 %   Single Plane A4C EF 80.1 %   Calc EF 72.0 %   S' Lateral 1.80 cm   AV Area mean vel 1.73 cm2   Area-P 1/2 3.12 cm2   MV VTI 3.06 cm2   Est EF 60 - 65%   Glucose, capillary   Collection Time: 05/16/24  9:40 PM  Result Value Ref Range   Glucose-Capillary 116 (H) 70 - 99 mg/dL  Glucose, capillary   Collection Time: 05/17/24  7:55 AM  Result Value Ref Range   Glucose-Capillary 101 (H) 70 - 99 mg/dL    Assessment and Plan  History of CVA (cerebrovascular accident) Assessment & Plan:  New additional CVA.  Residual symptoms gradually resolving.  Aggrenox  D/C'd and pt changed to Plvix and ASA. Follow up with neurology.  Statin intolerant but LDL at goal< 70   Emphysema lung (HCC) Assessment & Plan:  Has appt with pulmonary scheduled.  Using albuterol  as needed.   HYPERCHOLESTEROLEMIA Assessment & Plan: Chronic, now back at goal  on  zetia  10 mg daily.Charlesetta of statins... indicated give CAD/CVA history, goal  < 70.  Also now on colestipol  for diarrhea but encouraged pt to not stop zetia  as colestipol  not likely to be as effective.    Primary hypertension Assessment & Plan: Chronic, well controlled off amlodipine . If BP trending up can restart amlodipine  at 2.5 mg daily given 5 mg caused dizziness.   Statin intolerance Assessment & Plan: Side effects to statins in the past.   Other orders -     Clopidogrel  Bisulfate; Take 1 tablet (75 mg total) by mouth daily.  Dispense: 90 tablet; Refill: 3    No follow-ups on file.   Greig Ring, MD  "

## 2024-06-06 NOTE — Assessment & Plan Note (Signed)
 Side effects to statins in the past.

## 2024-06-08 ENCOUNTER — Other Ambulatory Visit: Payer: Self-pay | Admitting: Family Medicine

## 2024-06-12 ENCOUNTER — Encounter: Payer: Self-pay | Admitting: Student in an Organized Health Care Education/Training Program

## 2024-06-12 ENCOUNTER — Ambulatory Visit: Admitting: Student in an Organized Health Care Education/Training Program

## 2024-06-12 VITALS — BP 118/80 | HR 84 | Temp 98.1°F | Ht 58.5 in | Wt 172.8 lb

## 2024-06-12 DIAGNOSIS — R0602 Shortness of breath: Secondary | ICD-10-CM | POA: Diagnosis not present

## 2024-06-12 DIAGNOSIS — F17210 Nicotine dependence, cigarettes, uncomplicated: Secondary | ICD-10-CM | POA: Diagnosis not present

## 2024-06-12 DIAGNOSIS — F17200 Nicotine dependence, unspecified, uncomplicated: Secondary | ICD-10-CM

## 2024-06-12 LAB — NITRIC OXIDE: Nitric Oxide: 6

## 2024-06-12 MED ORDER — STIOLTO RESPIMAT 2.5-2.5 MCG/ACT IN AERS: 2.0000 | INHALATION_SPRAY | Freq: Every day | RESPIRATORY_TRACT | 11 refills | Status: AC

## 2024-06-12 NOTE — Progress Notes (Unsigned)
 " Assessment & Plan  #SOB (shortness of breath)  Chronic dyspnea and wheezing for the past few years in the setting of smoking history. The differential diagnosis includes COPD and asthma. No prior inhaler use except as needed albuterol . I have personally reviewed her multiple LDCT's performed for lung cancer screening and note what appears like mosaic attenuation which could reflect air trapping from small airway disease.  Ordered a pulmonary function test to evaluate lung function and assess spirometry for obstruction as well as reversibility. Will also start Stiolto empirically for possible COPD, and assess need for ICS on follow up after PFT's. Peak eosinophil count was 300 historically.  Lab Results  Component Value Date   NITRICOXIDE 6 06/12/2024   This result suggests low (<25) Type 2 (T2) airway inflammation indicating a low likelihood of active T2-driven airway inflammation; reduced probability of response to inhaled corticosteroids.   - Nitric oxide  - Pulmonary Function Test; Future - Tiotropium Bromide-Olodaterol (STIOLTO RESPIMAT ) 2.5-2.5 MCG/ACT AERS; Inhale 2 puffs into the lungs daily.  Dispense: 3 each; Refill: 11  #Nicotine  dependence    She currently smokes half a pack per day, having started at age 57 due to stress and her husband's health issues. Smoking cessation is important for lung health and overall well-being. Counseled on need and importance of cessation.  - Continue LDCT for lung cancer screening  Return in about 3 months (around 09/10/2024).  Belva November, MD [facility]  I spent 60 minutes caring for this patient today, including preparing to see the patient, obtaining a medical history , reviewing a separately obtained history, performing a medically appropriate examination and/or evaluation, counseling and educating the patient/family/caregiver, ordering medications, tests, or procedures, documenting clinical information in the  electronic health record, and independently interpreting results (not separately reported/billed) and communicating results to the patient/family/caregiver  End of visit medications:  Meds ordered this encounter  Medications   Tiotropium Bromide-Olodaterol (STIOLTO RESPIMAT ) 2.5-2.5 MCG/ACT AERS    Sig: Inhale 2 puffs into the lungs daily.    Dispense:  3 each    Refill:  11    Current Medications[1]   Subjective:   PATIENT ID: Tiffany Orozco GENDER: female DOB: [birth year], MRN: [id number]  Chief Complaint  Patient presents with   Consult    DOE. Wheezing. Occasional cough.  Albuterol - several times a day.    HPI  Discussed the use of AI scribe software for clinical note transcription with the patient, who gave verbal consent to proceed.  History of Present Illness  Tiffany Orozco is a 74 year old female with suspected COPD or asthma who presents with shortness of breath. She was referred by Dr. Jhonny for evaluation of wheezing and shortness of breath after hospitalization for an acute CVA.  She experiences shortness of breath primarily when overheated, such as in warm environments like the kitchen or outside in the sun. Her breathing worsens, and others can hear her wheezing, described as a whistling sound in her chest. No trouble sleeping is reported.  In December 2025, she was hospitalized for increased shortness of breath and a left-sided facial droop, leading to a diagnosis of an acute CVA in the right basal ganglia. During her hospital stay, she experienced wheezing and had to frequently adjust the thermostat in her room. She also had increased bathroom visits.  She has been experiencing wheezing for the past few years. No chronic cough is reported, and there were no breathing problems during childhood or as a young  adult.  She has a smoking history, having started at age 24 due to stress related to her husband's health problems. She currently smokes about half a  pack a day, down from a previous high of one and a half to two packs per day. She smokes outside, which makes it harder to breathe, especially in hot weather.  She uses an albuterol  inhaler as needed, but not daily. She is also enrolled in a lung cancer screening program, receiving yearly CT scans since 2015.   Ancillary information including prior medications, full medical/surgical/family/social histories, and PFTs (when available) are listed below and have been reviewed.    Review of Systems  Constitutional:  Negative for chills, fever and weight loss.  Respiratory:  Positive for shortness of breath and wheezing. Negative for cough, hemoptysis and sputum production.   Cardiovascular:  Negative for chest pain.  All other systems reviewed and are negative.    Objective:   Vitals:   06/12/24 1438  BP: 118/80  Pulse: 84  Temp: 98.1 F (36.7 C)  SpO2: 96%  Weight: 172 lb 12.8 oz (78.4 kg)  Height: 4' 10.5 (1.486 m)   96% on RA  BMI Readings from Last 3 Encounters:  06/12/24 35.50 kg/m  06/06/24 35.62 kg/m  05/15/24 36.58 kg/m   Wt Readings from Last 3 Encounters:  06/12/24 172 lb 12.8 oz (78.4 kg)  06/06/24 173 lb 6 oz (78.6 kg)  05/15/24 175 lb 0.7 oz (79.4 kg)    Physical Exam Constitutional:      Appearance: Normal appearance.  Cardiovascular:     Rate and Rhythm: Normal rate and regular rhythm.     Pulses: Normal pulses.     Heart sounds: Normal heart sounds.  Pulmonary:     Effort: Pulmonary effort is normal.     Breath sounds: Normal breath sounds. No wheezing or rales.  Neurological:     General: No focal deficit present.     Mental Status: She is alert and oriented to person, place, and time. Mental status is at baseline.     Ancillary Information    Past Medical History:  Diagnosis Date   Allergy    Anxiety    COPD (chronic obstructive pulmonary disease) (HCC)    DDD (degenerative disc disease), lumbar    mild   Depression    Female  rectocele without uterine prolapse    GERD (gastroesophageal reflux disease)    Hearing loss    Heart murmur    Hyperlipidemia    Hypertension    Hypothyroidism    IBS (irritable bowel syndrome)    Influenza A 06/2015   Intractable nausea and vomiting 03/01/2022   Obesity    Osteopenia    Plantar fasciitis    Pneumonia    Skin cancer of arm    Skin cancer of face    Stroke (HCC) 07/2010   no permenant deficits   Thrombocytopenia    Tubal pregnancy      Family History  Problem Relation Age of Onset   Skin cancer Father    Hypertension Mother    Lung cancer Brother    Colon cancer Sister    Esophageal cancer Sister    Ovarian cancer Sister    Stomach cancer Neg Hx    Pancreatic cancer Neg Hx      Past Surgical History:  Procedure Laterality Date   APPENDECTOMY     CHOLECYSTECTOMY  2004   COLONOSCOPY     COLONOSCOPY WITH PROPOFOL  N/A 10/21/2022  Procedure: COLONOSCOPY WITH PROPOFOL ;  Surgeon: Onita Elspeth Sharper, DO;  Location: [facility] ENDOSCOPY;  Service: Gastroenterology;  Laterality: N/A;   CYST EXCISION Right    breast mole   EMGs  11/2002   atms and wrists neg   ESOPHAGEAL MANOMETRY N/A 03/17/2015   Procedure: ESOPHAGEAL MANOMETRY (EM);  Surgeon: Elspeth Deward Naval, MD;  Location: [facility] ENDOSCOPY;  Service: Gastroenterology;  Laterality: N/A;   ESOPHAGOGASTRODUODENOSCOPY N/A 03/02/2022   Procedure: ESOPHAGOGASTRODUODENOSCOPY (EGD);  Surgeon: Onita Elspeth Sharper, DO;  Location: [facility] ENDOSCOPY;  Service: Gastroenterology;  Laterality: N/A;   ESOPHAGOGASTRODUODENOSCOPY (EGD) WITH PROPOFOL  N/A 05/06/2022   Procedure: ESOPHAGOGASTRODUODENOSCOPY (EGD) WITH PROPOFOL ;  Surgeon: Onita Elspeth Sharper, DO;  Location: [facility] ENDOSCOPY;  Service: Gastroenterology;  Laterality: N/A;   FOOT SURGERY Right 2008, 2009, 2010   torn ligament, hammertoes   HERNIA REPAIR     TUBAL LIGATION  1975   UPPER GI ENDOSCOPY     XI ROBOTIC ASSISTED VENTRAL HERNIA N/A 04/14/2020    Procedure: XI ROBOTIC ASSISTED VENTRAL HERNIA;  Surgeon: Marolyn Nest, MD;  Location: [facility] ORS;  Service: General;  Laterality: N/A;    Social History   Socioeconomic History   Marital status: Married    Spouse name: Kriya Westra   Number of children: 2   Years of education: Not on file   Highest education level: Not on file  Occupational History   Occupation: bookkeeper    Associate Professor: UNEMPLOYED    Comment: 10/10 not working x 9 years  Tobacco Use   Smoking status: Every Day    Current packs/day: 0.75    Average packs/day: 0.8 packs/day for 31.0 years (23.3 ttl pk-yrs)    Types: Cigarettes   Smokeless tobacco: Never   Tobacco comments:    Smokes 15 cigarettes a day- khj 06/12/2024        Started smoking at 74 years old    Smoked 1.5 PPD at her heaviest  Vaping Use   Vaping status: Never Used  Substance and Sexual Activity   Alcohol use: No    Alcohol/week: 0.0 standard drinks of alcohol   Drug use: No   Sexual activity: Yes    Birth control/protection: Post-menopausal  Other Topics Concern   Not on file  Social History Narrative   5/10 husband had open heart surgery, not working, 2 grandchildren, siblings living and healthy   Social Drivers of Health   Tobacco Use: High Risk (06/06/2024)   Patient History    Smoking Tobacco Use: Every Day    Smokeless Tobacco Use: Never    Passive Exposure: Not on file  Financial Resource Strain: Low Risk (04/18/2024)   Overall Financial Resource Strain (CARDIA)    Difficulty of Paying Living Expenses: Not hard at all  Food Insecurity: No Food Insecurity (05/15/2024)   Epic    Worried About Radiation Protection Practitioner of Food in the Last Year: Never true    Ran Out of Food in the Last Year: Never true  Transportation Needs: No Transportation Needs (05/15/2024)   Epic    Lack of Transportation (Medical): No    Lack of Transportation (Non-Medical): No  Physical Activity: Sufficiently Active (04/18/2024)   Exercise Vital Sign    Days of  Exercise per Week: 5 days    Minutes of Exercise per Session: 30 min  Stress: No Stress Concern Present (04/18/2024)   [employer] of Occupational Health - Occupational Stress Questionnaire    Feeling of Stress: Not at all  Social Connections: Socially Isolated (05/15/2024)  Social Connection and Isolation Panel    Frequency of Communication with Friends and Family: More than three times a week    Frequency of Social Gatherings with Friends and Family: More than three times a week    Attends Religious Services: Never    Database Administrator or Organizations: No    Attends Banker Meetings: Never    Marital Status: Separated  Intimate Partner Violence: Not At Risk (05/15/2024)   Epic    Fear of Current or Ex-Partner: No    Emotionally Abused: No    Physically Abused: No    Sexually Abused: No  Depression (PHQ2-9): Low Risk (06/06/2024)   Depression (PHQ2-9)    PHQ-2 Score: 0  Alcohol Screen: Low Risk (04/18/2024)   Alcohol Screen    Last Alcohol Screening Score (AUDIT): 0  Housing: Low Risk (05/15/2024)   Epic    Unable to Pay for Housing in the Last Year: No    Number of Times Moved in the Last Year: 0    Homeless in the Last Year: No  Utilities: Not At Risk (05/15/2024)   Epic    Threatened with loss of utilities: No  Health Literacy: Adequate Health Literacy (04/18/2024)   B1300 Health Literacy    Frequency of need for help with medical instructions: Never     Allergies[2]   CBC    Component Value Date/Time   WBC 6.1 05/15/2024 1136   RBC 4.95 05/15/2024 1136   HGB 15.2 (H) 05/15/2024 1136   HCT 45.5 05/15/2024 1136   PLT 309 05/15/2024 1136   MCV 91.9 05/15/2024 1136   MCH 30.7 05/15/2024 1136   MCHC 33.4 05/15/2024 1136   RDW 13.3 05/15/2024 1136   LYMPHSABS 2.0 05/15/2024 1136   MONOABS 0.5 05/15/2024 1136   EOSABS 0.1 05/15/2024 1136   BASOSABS 0.0 05/15/2024 1136    Pulmonary Functions Testing Results:     No data to display           Outpatient Medications Prior to Visit  Medication Sig Dispense Refill   albuterol  (VENTOLIN  HFA) 108 (90 Base) MCG/ACT inhaler Inhale 2 puffs into the lungs every 6 (six) hours as needed for wheezing or shortness of breath. 6.7 g 2   aspirin  EC 81 MG tablet Take 1 tablet (81 mg total) by mouth daily. Swallow whole.     Boswellia Serrata (BOSWELLIA PO) Take 1 capsule by mouth daily.     Cholecalciferol  (VITAMIN D3) 125 MCG (5000 UT) TABS Take 5,000 Units by mouth daily.     clopidogrel  (PLAVIX ) 75 MG tablet Take 1 tablet (75 mg total) by mouth daily. 90 tablet 3   ezetimibe  (ZETIA ) 10 MG tablet Take 1 tablet (10 mg total) by mouth daily. 90 tablet 3   fluticasone  (FLONASE ) 50 MCG/ACT nasal spray SPRAY 2 SPRAYS INTO EACH NOSTRIL EVERY DAY 48 mL 3   levothyroxine  (SYNTHROID ) 75 MCG tablet TAKE 1 TABLET BY MOUTH EVERY DAY 90 tablet 3   pantoprazole  (PROTONIX ) 40 MG tablet Take 1 tablet (40 mg total) by mouth daily. 90 tablet 1   No facility-administered medications prior to visit.      [1]  Current Outpatient Medications:    albuterol  (VENTOLIN  HFA) 108 (90 Base) MCG/ACT inhaler, Inhale 2 puffs into the lungs every 6 (six) hours as needed for wheezing or shortness of breath., Disp: 6.7 g, Rfl: 2   aspirin  EC 81 MG tablet, Take 1 tablet (81 mg total) by mouth daily. Swallow  whole., Disp: , Rfl:    Boswellia Serrata (BOSWELLIA PO), Take 1 capsule by mouth daily., Disp: , Rfl:    Cholecalciferol  (VITAMIN D3) 125 MCG (5000 UT) TABS, Take 5,000 Units by mouth daily., Disp: , Rfl:    clopidogrel  (PLAVIX ) 75 MG tablet, Take 1 tablet (75 mg total) by mouth daily., Disp: 90 tablet, Rfl: 3   ezetimibe  (ZETIA ) 10 MG tablet, Take 1 tablet (10 mg total) by mouth daily., Disp: 90 tablet, Rfl: 3   fluticasone  (FLONASE ) 50 MCG/ACT nasal spray, SPRAY 2 SPRAYS INTO EACH NOSTRIL EVERY DAY, Disp: 48 mL, Rfl: 3   levothyroxine  (SYNTHROID ) 75 MCG tablet, TAKE 1 TABLET BY MOUTH EVERY DAY, Disp: 90 tablet,  Rfl: 3   pantoprazole  (PROTONIX ) 40 MG tablet, Take 1 tablet (40 mg total) by mouth daily., Disp: 90 tablet, Rfl: 1   Tiotropium Bromide-Olodaterol (STIOLTO RESPIMAT ) 2.5-2.5 MCG/ACT AERS, Inhale 2 puffs into the lungs daily., Disp: 3 each, Rfl: 11 [2]  Allergies Allergen Reactions   Lisinopril  Shortness Of Breath   Niacin Other (See Comments)     dizziness   Welchol  [Colesevelam  Hcl] Diarrhea, Nausea Only, Swelling and Other (See Comments)     swelling in face   Augmentin  [Amoxicillin -Pot Clavulanate] Nausea Only    GI upset with taking.   Oxycodone Hcl     Patient states this medication makes her hyper   "

## 2024-06-12 NOTE — Patient Instructions (Signed)
 " VISIT SUMMARY: During your visit, we discussed your shortness of breath and wheezing, which are suspected to be due to chronic obstructive pulmonary disease (COPD) or asthma. We also reviewed your smoking history and its impact on your respiratory health.  YOUR PLAN: -CHRONIC DYSPNEA AND WHEEZING: Chronic dyspnea and wheezing refer to ongoing shortness of breath and a whistling sound when breathing. These symptoms are suspected to be due to chronic obstructive pulmonary disease (COPD) or asthma. We have ordered a pulmonary function test to determine the exact cause. In the meantime, you have been prescribed a daily inhaler to use each morning. We will follow up after the pulmonary function test to discuss the results and adjust your treatment plan as needed.  -NICOTINE  DEPENDENCE: Nicotine  dependence means that your body is reliant on nicotine , a substance found in tobacco. This dependence is a result of your smoking habit, which started at age 39. Smoking can worsen respiratory issues like COPD and asthma. We recommend considering smoking cessation programs to help you quit smoking, which will improve your overall health and breathing.  INSTRUCTIONS: Please complete the pulmonary function test as ordered. Use your new daily inhaler each morning as prescribed. We will schedule a follow-up appointment after your pulmonary function test to review the results and adjust your treatment plan. Consider exploring smoking cessation programs to help you quit smoking.   The [place]  Quitline: Call 1-800-QUIT-NOW (850-181-0818). The [place] Quitline is a free service for [place]  residents. Trained counselors are available from 8 am until 3 am, 365 days per year. Services are available in both English and Spanish.   Web Resources Free online support programs can help you track your progress and share experiences with others who are quitting. These are examples: www.becomeanex.org www.trytostop.org   www.smokefree.gov  www.https://www.vargas.com/.aspx  [facility]: offers comprehensive in-person tobacco treatment counseling at [facility] building ([address], [place] [address]).  Open to everyone. Virtual appointments available. Free parking. Call [phone] to schedule an appointment or [phone] for general information.    Tobacco Cessation Medications  Nicotine  Replacement Therapy (NRT)  Nicotine  is the addictive part of tobacco smoke, but not the most dangerous part. There are 7000 other toxins in cigarettes, including carbon monoxide, that cause disease. People do not generally become addicted to medication. Common problems: People don't use enough medication or stop too early. Medications are safe and effective. Overdose is very uncommon. Use medications as long as needed (3 months minimum). Some combinations work better than single medications. Long acting medications like the NRT patch and bupropion  provide continuous treatment for withdrawal symptoms.  PLUS  Short acting medications like the NRT gum, lozenge, inhaler, and nasal spray help people to cope with breakthrough cravings.  ? Nicotine  Patch  Place patch on hairless skin on upper body, including arms and back. Each day: discard old patch, shower, apply new patch to a different site. Apply hydrocortisone cream to mildly red/irritated areas. Call provider if rash develops. If patch causes sleep disturbance, remove patch at bedtime and replace each morning after shower. Side effects may include: skin irritation, headache, insomnia, abnormal/vivid dreams.  ? Nicotine  Gum  Chew gum slowly, park in cheek when peppery taste or tingling sensation begins (about 15-30 chews). When taste or tingling goes away, begin chewing again. Use until nicotine  is gone (taste or tingle does not return, usually 30 minutes). Park in different areas of mouth. Nicotine  is absorbed  through the lining of the mouth. Use enough  to control cravings, up to 24 pieces per day (if used alone). Avoid eating or drinking for 15 minutes before using and during use. Side effects may include: mouth/jaw soreness, hiccups, indigestion, hypersalivation.  If gum is not chewed correctly, additional side effects may include lightheadedness, nausea/vomiting, throat and mouth irritation.  ? Nicotine  Lozenge  Allow to dissolve slowly in mouth (20-30 minutes). Do not chew or swallow. Nicotine  release may cause a warm tingling sensation. Occasionally rotate to different areas of the mouth. Use enough to control cravings, up to 20 lozenges per day (if used alone). Avoid eating or drinking for 15 minutes before using and during use. Side effects may include: nausea, hiccups, cough, heartburn, headache, gas, insomnia.  ? Nicotine  Nasal Spray Use 1 spray in each nostril (1 dose) and tilt head back for 1 minute. Do not sniff, swallow, or inhale through nose.  Use at least 8 doses (1 spray in each nostril) , up to 40 doses per day (if used alone). To reduce nasal irritation, spray on cotton swab and insert into nose. Side effects may include: nasal and/or throat irritation (hot, peppery, or burning sensation), nasal irritation, tearing, sneezing, cough, headache.  ? Nicotine  Oral Inhaler (puffer) Inhale into the back of the throat or puff in short breaths. Do not inhale into the lungs.  Puff continuously for 20 minutes (about 80 puffs) until cartridge is empty. Change cartridge when it loses the burning in throat sensation (feels like air only). Open cartridges can be saved and used again within 24 hours. Use at least 6 and up to 16 cartridges per day (if used alone).  Avoid eating or drinking for 15 minutes before using and during use. Side effects may include: mouth and/or throat irritation, unpleasant taste, cough, nasal irritation, indigestion, hiccups, headache.  ? Chantix  (varenicline) Days 1-3: Take one 0.5 mg white pill each morning for 3 days, one week before quit date. Days 4-7: Increase to one 0.5 mg white pill twice a day in morning and evening for 4 days.  On Day 8 (target quit date), increase to one 1 mg blue pill twice a day. Maintain this dose for a minimum of 3 months. Take with food and a full glass of water to reduce nausea. Be sure that the two doses are at least 8 hours apart, but try to take second dose early in the evening (i.e. 6 pm) to avoid sleep problems. Common side effects include: nausea, insomnia, headache, abnormal/vivid dreams. Tell your doctor if you have any history of psychiatric illness prior to starting Chantix.  STOP taking CHANTIX and contact a healthcare provider immediately if you experience agitation, hostility, depressed mood, changes in thoughts or behavior that are not typical for you, thinking about or attempting suicide, allergic or skin reactions including swelling, rash, redness, or peeling of the skin.  For patients who have heart disease: Smoking is a major risk factor for cardiovascular disease, and Chantix can help you quit smoking. Chantix may be associated with a small, increased risk of certain heart events in patients who have heart disease. If you have any new or worsening symptoms of heart disease while taking Chantix, such as shortness of breath or trouble breathing, new or worsening chest pain, or new or worsening pain in your legs when walking, call your doctor or get emergency medical help immediately.  ? Wellbutrin  / Zyban  (bupropion ) Take one 150 mg pill each morning for 3 days, one week before target quit date. On Day 4, increase  to one 150 mg pill twice a day, morning and evening.  Maintain this dose for a minimum of 3 months. Be sure that the two doses are at least 8 hours apart, but try to take second dose early in the evening (i.e. 6 pm) to avoid sleep problems. Avoid or minimize use of alcohol when  taking this medication. Common side effects include: dry mouth, headache, insomnia, nausea, weight loss.  Risk of seizure is 05/998. STOP taking BUPROPION  and contact a healthcare provider immediately if you experience agitation, hostility, depressed mood, changes in thoughts or behavior that are not typical for you, thinking about or attempting suicide, allergic or skin reactions including swelling, rash, redness, or peeling of the skin.                    Contains text generated by [employer]bridge.                                 Contains text generated by Abridge.   "

## 2024-06-19 ENCOUNTER — Ambulatory Visit (INDEPENDENT_AMBULATORY_CARE_PROVIDER_SITE_OTHER)

## 2024-06-19 DIAGNOSIS — R0602 Shortness of breath: Secondary | ICD-10-CM

## 2024-06-19 LAB — PULMONARY FUNCTION TEST
DL/VA % pred: 107 %
DL/VA: 4.63 ml/min/mmHg/L
DLCO cor % pred: 96 %
DLCO cor: 15.05 ml/min/mmHg
DLCO unc % pred: 101 %
DLCO unc: 15.82 ml/min/mmHg
FEF 25-75 Post: 1.5 L/s
FEF 25-75 Pre: 1.44 L/s
FEF2575-%Change-Post: 4 %
FEF2575-%Pred-Post: 110 %
FEF2575-%Pred-Pre: 105 %
FEV1-%Change-Post: 1 %
FEV1-%Pred-Post: 87 %
FEV1-%Pred-Pre: 86 %
FEV1-Post: 1.41 L
FEV1-Pre: 1.39 L
FEV1FVC-%Change-Post: -7 %
FEV1FVC-%Pred-Pre: 108 %
FEV6-%Change-Post: 10 %
FEV6-%Pred-Post: 92 %
FEV6-%Pred-Pre: 83 %
FEV6-Post: 1.89 L
FEV6-Pre: 1.71 L
FEV6FVC-%Pred-Post: 105 %
FEV6FVC-%Pred-Pre: 105 %
FVC-%Change-Post: 10 %
FVC-%Pred-Post: 87 %
FVC-%Pred-Pre: 78 %
FVC-Post: 1.89 L
FVC-Pre: 1.71 L
Post FEV1/FVC ratio: 75 %
Post FEV6/FVC ratio: 100 %
Pre FEV1/FVC ratio: 81 %
Pre FEV6/FVC Ratio: 100 %
RV % pred: 90 %
RV: 1.83 L
TLC % pred: 88 %
TLC: 3.74 L

## 2024-06-19 NOTE — Patient Instructions (Signed)
 Full PFT completed today ? ?

## 2024-06-19 NOTE — Progress Notes (Signed)
 Full PFT completed today ? ?

## 2024-06-23 ENCOUNTER — Other Ambulatory Visit: Payer: Self-pay | Admitting: Family Medicine

## 2024-08-27 ENCOUNTER — Ambulatory Visit: Admitting: Neurology

## 2024-09-26 ENCOUNTER — Ambulatory Visit: Admitting: Student in an Organized Health Care Education/Training Program

## 2025-04-26 ENCOUNTER — Other Ambulatory Visit

## 2025-05-03 ENCOUNTER — Encounter: Admitting: Family Medicine

## 2025-05-03 ENCOUNTER — Ambulatory Visit
